# Patient Record
Sex: Male | Born: 1940
Health system: Southern US, Community
[De-identification: ages and names within clinical notes are randomized; demographics above are authoritative.]

## PROBLEM LIST (undated history)

## (undated) DIAGNOSIS — R600 Localized edema: Secondary | ICD-10-CM

## (undated) DIAGNOSIS — N183 Chronic kidney disease, stage 3 unspecified: Secondary | ICD-10-CM

## (undated) DIAGNOSIS — I10 Essential (primary) hypertension: Secondary | ICD-10-CM

## (undated) DIAGNOSIS — E785 Hyperlipidemia, unspecified: Secondary | ICD-10-CM

## (undated) DIAGNOSIS — I82409 Acute embolism and thrombosis of unspecified deep veins of unspecified lower extremity: Secondary | ICD-10-CM

## (undated) HISTORY — PX: COLONOSCOPY: SHX174

---

## 2002-03-20 ENCOUNTER — Ambulatory Visit (HOSPITAL_COMMUNITY): Admission: RE | Admit: 2002-03-20 | Discharge: 2002-03-20 | Payer: Self-pay | Admitting: Family Medicine

## 2002-03-20 ENCOUNTER — Encounter: Payer: Self-pay | Admitting: Family Medicine

## 2002-05-03 ENCOUNTER — Encounter: Admission: RE | Admit: 2002-05-03 | Discharge: 2002-08-01 | Payer: Self-pay | Admitting: Family Medicine

## 2006-08-04 ENCOUNTER — Ambulatory Visit (HOSPITAL_COMMUNITY): Admission: RE | Admit: 2006-08-04 | Discharge: 2006-08-04 | Payer: Self-pay | Admitting: Internal Medicine

## 2010-02-20 HISTORY — PX: COLONOSCOPY: SHX174

## 2010-02-25 ENCOUNTER — Encounter: Payer: Self-pay | Admitting: Internal Medicine

## 2010-03-17 ENCOUNTER — Ambulatory Visit: Payer: Self-pay | Admitting: Internal Medicine

## 2010-03-17 ENCOUNTER — Ambulatory Visit (HOSPITAL_COMMUNITY): Admission: RE | Admit: 2010-03-17 | Discharge: 2010-03-17 | Payer: Self-pay | Admitting: Internal Medicine

## 2010-03-18 ENCOUNTER — Encounter: Payer: Self-pay | Admitting: Internal Medicine

## 2010-08-13 ENCOUNTER — Emergency Department (HOSPITAL_COMMUNITY)
Admission: EM | Admit: 2010-08-13 | Discharge: 2010-08-13 | Payer: Self-pay | Source: Home / Self Care | Admitting: Emergency Medicine

## 2010-12-22 NOTE — Letter (Signed)
Summary: Internal Other Domingo Dimes  Internal Other Domingo Dimes   Imported By: Cloria Spring LPN 04/54/0981 19:14:78  _____________________________________________________________________  External Attachment:    Type:   Image     Comment:   External Document

## 2010-12-24 NOTE — Letter (Signed)
Summary: Patient Notice, Colon Biopsy Results  [facility]  [address]   [place], [place] [address]   Phone: [phone]  Fax: [phone]       March 18, 2010   TURRELL SEVERT [address] [place], [place]  [address] [birth year]    Dear Mr. Witty,  I am pleased to inform you that the biopsies taken during your recent colonoscopy did not show any evidence of cancer upon pathologic examination.  Additional information/recommendations:  You should have a repeat colonoscopy examination  in 5 years.  Please call us if you are having persistent problems or have questions about your condition that have not been fully answered at this time.  Sincerely,    R. Roetta Sessions MD, FACP [facility] Ph: [phone]    Fax: [phone]   Appended Document: Patient Notice, Colon Biopsy Results letter mailed to pt  Appended Document: Patient Notice, Colon Biopsy Results Reminder placed in idx.

## 2011-02-09 LAB — GLUCOSE, CAPILLARY: Glucose-Capillary: 153 mg/dL — ABNORMAL HIGH (ref 70–99)

## 2011-02-26 ENCOUNTER — Emergency Department (HOSPITAL_COMMUNITY): Payer: Medicare PPO

## 2011-02-26 ENCOUNTER — Emergency Department (HOSPITAL_COMMUNITY)
Admission: EM | Admit: 2011-02-26 | Discharge: 2011-02-26 | Disposition: A | Discharge status: Home / Self Care | Payer: Medicare PPO | Attending: Emergency Medicine | Admitting: Emergency Medicine

## 2011-02-26 ENCOUNTER — Encounter (HOSPITAL_COMMUNITY): Payer: Self-pay | Admitting: Radiology

## 2011-02-26 DIAGNOSIS — I10 Essential (primary) hypertension: Secondary | ICD-10-CM | POA: Insufficient documentation

## 2011-02-26 DIAGNOSIS — X58XXXA Exposure to other specified factors, initial encounter: Secondary | ICD-10-CM | POA: Insufficient documentation

## 2011-02-26 DIAGNOSIS — E119 Type 2 diabetes mellitus without complications: Secondary | ICD-10-CM | POA: Insufficient documentation

## 2011-02-26 DIAGNOSIS — R000 Tachycardia, unspecified: Secondary | ICD-10-CM | POA: Insufficient documentation

## 2011-02-26 DIAGNOSIS — Y998 Other external cause status: Secondary | ICD-10-CM | POA: Insufficient documentation

## 2011-02-26 DIAGNOSIS — S61209A Unspecified open wound of unspecified finger without damage to nail, initial encounter: Secondary | ICD-10-CM | POA: Insufficient documentation

## 2011-02-26 DIAGNOSIS — Z1889 Other specified retained foreign body fragments: Secondary | ICD-10-CM | POA: Insufficient documentation

## 2011-02-26 HISTORY — DX: Essential (primary) hypertension: I10

## 2011-03-01 NOTE — Consult Note (Signed)
  NAMEJONG, RICKMAN NO.:  [id number]  MEDICAL RECORD NO.:  [id number]           PATIENT TYPE:  E  LOCATION:  APED                          FACILITY:  [facility]  PHYSICIAN:  J. Darreld Mclean, M.D. DATE OF BIRTH:  [birth year]  DATE OF CONSULTATION:  02/26/2011 DATE OF DISCHARGE:  02/26/2011                                CONSULTATION   Hugh Kamara was seen in the emergency room.  The patient is a 70 year old male who was working with a tire and a steel belt, couple pieces of metal got up in his left thumb.  About 3 days ago, he got 2 out, 1 small piece remains.  The PA anesthetized the finger and tried to get it out, he could not and he called me.  I triangulated with pieces of different sizes of needle.  I did not know where the small piece of metal was, but we cannot get good tourniquet control in the ER here.  They had no sterile rubber bands used at the base of his thumb to access with temporary finger tourniquet, so I decided to stop.  I did not want to try to it get out but that was not what I needed.  I did anesthetized and place two 3-0 nylon sutures in place loosely.  The deep bulky sterile dressing was applied.  I will see him in the office Monday morning and I will scheduled next week for removal of the foreign body from his left thumb in the operating room. The patient is agreeable to this.  Prescription given for Keflex and for pain Vicodin.  If any difficulty, he is to call and let me know and I will see him in the office Monday morning.          ______________________________ J. Darreld Mclean, M.D.     JWK/MEDQ  D:  02/26/2011  T:  02/27/2011  Job:  [id number]  Electronically Signed by Darreld Mclean M.D. on 03/01/2011 12:46:02 PM

## 2012-01-04 ENCOUNTER — Other Ambulatory Visit: Payer: Self-pay

## 2012-01-04 ENCOUNTER — Inpatient Hospital Stay (HOSPITAL_COMMUNITY)
Admission: EM | Admit: 2012-01-04 | Discharge: 2012-01-07 | DRG: 690 | Disposition: A | Discharge status: Home / Self Care | Payer: Medicare PPO | Attending: Internal Medicine | Admitting: Internal Medicine

## 2012-01-04 ENCOUNTER — Emergency Department (HOSPITAL_COMMUNITY): Payer: Medicare PPO

## 2012-01-04 ENCOUNTER — Encounter (HOSPITAL_COMMUNITY): Payer: Self-pay | Admitting: *Deleted

## 2012-01-04 DIAGNOSIS — R739 Hyperglycemia, unspecified: Secondary | ICD-10-CM

## 2012-01-04 DIAGNOSIS — N390 Urinary tract infection, site not specified: Principal | ICD-10-CM | POA: Diagnosis present

## 2012-01-04 DIAGNOSIS — Z6831 Body mass index (BMI) 31.0-31.9, adult: Secondary | ICD-10-CM

## 2012-01-04 DIAGNOSIS — I10 Essential (primary) hypertension: Secondary | ICD-10-CM | POA: Diagnosis present

## 2012-01-04 DIAGNOSIS — Z7982 Long term (current) use of aspirin: Secondary | ICD-10-CM

## 2012-01-04 DIAGNOSIS — Z79899 Other long term (current) drug therapy: Secondary | ICD-10-CM

## 2012-01-04 DIAGNOSIS — R000 Tachycardia, unspecified: Secondary | ICD-10-CM

## 2012-01-04 DIAGNOSIS — E119 Type 2 diabetes mellitus without complications: Secondary | ICD-10-CM | POA: Diagnosis present

## 2012-01-04 LAB — URINALYSIS, ROUTINE W REFLEX MICROSCOPIC
Nitrite: NEGATIVE
Specific Gravity, Urine: >1.03 — ABNORMAL HIGH (ref 1.005–1.030)
Urobilinogen, UA: 0.2 mg/dL (ref 0.0–1.0)

## 2012-01-04 LAB — COMPREHENSIVE METABOLIC PANEL
ALT: 40 U/L (ref 0–53)
Alkaline Phosphatase: 105 U/L (ref 39–117)
CO2: 29 mEq/L (ref 19–32)
GFR calc Af Amer: >90 mL/min (ref >90)
GFR calc non Af Amer: 80 mL/min — ABNORMAL LOW (ref >90)
Glucose, Bld: 223 mg/dL — ABNORMAL HIGH (ref 70–99)
Potassium: 4.1 mEq/L (ref 3.5–5.1)
Sodium: 138 mEq/L (ref 135–145)

## 2012-01-04 LAB — DIFFERENTIAL
Lymphocytes Relative: 6 % — ABNORMAL LOW (ref 12–46)
Lymphs Abs: 0.6 10*3/uL — ABNORMAL LOW (ref 0.7–4.0)
Neutrophils Relative %: 91 % — ABNORMAL HIGH (ref 43–77)

## 2012-01-04 LAB — CBC
Platelets: 282 10*3/uL (ref 150–400)
RBC: 4.75 MIL/uL (ref 4.22–5.81)
WBC: 11 10*3/uL — ABNORMAL HIGH (ref 4.0–10.5)

## 2012-01-04 LAB — URINE MICROSCOPIC-ADD ON

## 2012-01-04 LAB — GLUCOSE, CAPILLARY: Glucose-Capillary: 192 mg/dL — ABNORMAL HIGH (ref 70–99)

## 2012-01-04 MED ORDER — ASPIRIN 81 MG PO CHEW
81.0000 mg | CHEWABLE_TABLET | Freq: Every day | ORAL | Status: DC
  Administered 2012-01-05 – 2012-01-07 (×3): 81 mg via ORAL
  Filled 2012-01-04 (×3): qty 1

## 2012-01-04 MED ORDER — MORPHINE SULFATE 2 MG/ML IJ SOLN
1.0000 mg | INTRAMUSCULAR | Status: DC | PRN
  Administered 2012-01-05 – 2012-01-07 (×6): 1 mg via INTRAVENOUS
  Filled 2012-01-04 (×6): qty 1

## 2012-01-04 MED ORDER — ONDANSETRON HCL 4 MG/2ML IJ SOLN: 4.0000 mg | INTRAMUSCULAR | Status: DC | PRN

## 2012-01-04 MED ORDER — ONDANSETRON HCL 4 MG/2ML IJ SOLN
INTRAMUSCULAR | Status: AC
  Administered 2012-01-04: 4 mg
  Filled 2012-01-04: qty 2

## 2012-01-04 MED ORDER — SIMVASTATIN 20 MG PO TABS
20.0000 mg | ORAL_TABLET | Freq: Every day | ORAL | Status: DC
Start: 2012-01-05 — End: 2012-01-05

## 2012-01-04 MED ORDER — ONDANSETRON HCL 4 MG/2ML IJ SOLN
4.0000 mg | Freq: Once | INTRAMUSCULAR | Status: AC
  Administered 2012-01-04: 4 mg via INTRAVENOUS
  Filled 2012-01-04: qty 2

## 2012-01-04 MED ORDER — LISINOPRIL 10 MG PO TABS
20.0000 mg | ORAL_TABLET | Freq: Every day | ORAL | Status: DC
  Administered 2012-01-05 – 2012-01-07 (×3): 20 mg via ORAL
  Filled 2012-01-04 (×3): qty 2

## 2012-01-04 MED ORDER — ENOXAPARIN SODIUM 40 MG/0.4ML ~~LOC~~ SOLN
40.0000 mg | SUBCUTANEOUS | Status: DC
  Administered 2012-01-05 – 2012-01-07 (×3): 40 mg via SUBCUTANEOUS
  Filled 2012-01-04 (×3): qty 0.4

## 2012-01-04 MED ORDER — HYDRALAZINE HCL 25 MG PO TABS
25.0000 mg | ORAL_TABLET | Freq: Two times a day (BID) | ORAL | Status: DC
  Administered 2012-01-05 – 2012-01-07 (×5): 25 mg via ORAL
  Filled 2012-01-04 (×5): qty 1

## 2012-01-04 MED ORDER — METFORMIN HCL 500 MG PO TABS
1000.0000 mg | ORAL_TABLET | Freq: Two times a day (BID) | ORAL | Status: DC
  Administered 2012-01-05 – 2012-01-07 (×4): 1000 mg via ORAL
  Filled 2012-01-04 (×4): qty 2

## 2012-01-04 MED ORDER — GLIPIZIDE 5 MG PO TABS
10.0000 mg | ORAL_TABLET | Freq: Two times a day (BID) | ORAL | Status: DC
  Administered 2012-01-05 – 2012-01-07 (×4): 10 mg via ORAL
  Filled 2012-01-04 (×4): qty 2

## 2012-01-04 MED ORDER — SIMVASTATIN 20 MG PO TABS: 20.0000 mg | ORAL_TABLET | Freq: Every day | ORAL | Status: DC

## 2012-01-04 MED ORDER — DEXTROSE 5 % IV SOLN
1.0000 g | INTRAVENOUS | Status: DC
  Administered 2012-01-05 – 2012-01-06 (×2): 1 g via INTRAVENOUS
  Filled 2012-01-04 (×5): qty 10

## 2012-01-04 MED ORDER — DEXTROSE 5 % IV SOLN
1.0000 g | Freq: Once | INTRAVENOUS | Status: AC
  Administered 2012-01-04: 1 g via INTRAVENOUS
  Filled 2012-01-04: qty 10

## 2012-01-04 MED ORDER — MORPHINE SULFATE 4 MG/ML IJ SOLN
4.0000 mg | Freq: Once | INTRAMUSCULAR | Status: AC
  Administered 2012-01-04: 4 mg via INTRAVENOUS
  Filled 2012-01-04: qty 1

## 2012-01-04 MED ORDER — INSULIN ASPART 100 UNIT/ML ~~LOC~~ SOLN
0.0000 [IU] | Freq: Three times a day (TID) | SUBCUTANEOUS | Status: DC
  Administered 2012-01-05 (×2): 2 [IU] via SUBCUTANEOUS
  Administered 2012-01-06: 5 [IU] via SUBCUTANEOUS
  Filled 2012-01-04: qty 3

## 2012-01-04 MED ORDER — CLONIDINE HCL 0.2 MG PO TABS
ORAL_TABLET | ORAL | Status: AC
  Administered 2012-01-04: 0.2 mg
  Filled 2012-01-04: qty 1

## 2012-01-04 MED ORDER — INSULIN ASPART 100 UNIT/ML ~~LOC~~ SOLN: 0.0000 [IU] | Freq: Every day | SUBCUTANEOUS | Status: DC

## 2012-01-04 MED ORDER — SODIUM CHLORIDE 0.9 % IV SOLN
INTRAVENOUS | Status: DC
  Administered 2012-01-05 – 2012-01-06 (×3): via INTRAVENOUS

## 2012-01-04 MED ORDER — VERAPAMIL HCL 80 MG PO TABS
ORAL_TABLET | ORAL | Status: AC
  Administered 2012-01-04: 120 mg
  Filled 2012-01-04: qty 2

## 2012-01-04 MED ORDER — CLONIDINE HCL 0.2 MG PO TABS
0.2000 mg | ORAL_TABLET | Freq: Every day | ORAL | Status: DC
  Administered 2012-01-05 – 2012-01-07 (×3): 0.2 mg via ORAL
  Filled 2012-01-04 (×3): qty 1

## 2012-01-04 MED ORDER — MORPHINE SULFATE 2 MG/ML IJ SOLN
INTRAMUSCULAR | Status: AC
  Administered 2012-01-04: 1 mg via INTRAVENOUS
  Filled 2012-01-04: qty 1

## 2012-01-04 MED ORDER — LINAGLIPTIN 5 MG PO TABS
5.0000 mg | ORAL_TABLET | Freq: Every day | ORAL | Status: DC
  Administered 2012-01-05 – 2012-01-07 (×3): 5 mg via ORAL
  Filled 2012-01-04 (×6): qty 1

## 2012-01-04 MED ORDER — SODIUM CHLORIDE 0.9 % IV BOLUS (SEPSIS)
1000.0000 mL | Freq: Once | INTRAVENOUS | Status: AC
  Administered 2012-01-04: 1000 mL via INTRAVENOUS

## 2012-01-04 NOTE — ED Notes (Signed)
Pt woke up this morning with mid back pain and vomiting.  Denies abd pain, denies hematuria.

## 2012-01-04 NOTE — ED Provider Notes (Signed)
History  Scribed for Joya Gaskins, MD, the patient was seen in room APA11/APA11. This chart was scribed by Candelaria Stagers. The patient's care started at 3:29 PM    CSN: [id number]  Arrival date & time 01/04/12  1404   First MD Initiated Contact with Patient 01/04/12 1505      Chief Complaint  Patient presents with  . Emesis  . Back Pain     Patient is a 71 y.o. male presenting with vomiting and back pain. The history is provided by the patient.  Emesis  This is a new problem. The current episode started 6 to 12 hours ago. The problem occurs 2 to 4 times per day. The problem has not changed since onset.There has been no fever. Pertinent negatives include no abdominal pain, no cough, no diarrhea and no fever.  Back Pain  This is a new problem. The current episode started 6 to 12 hours ago. The pain is present in the lumbar spine. The quality of the pain is described as aching. The pain does not radiate. The pain is moderate. The symptoms are aggravated by twisting and bending. Pertinent negatives include no chest pain, no fever, no abdominal pain and no weakness. He has tried nothing for the symptoms. The treatment provided no relief.   Stephen Rogers is a 71 y.o. male who presents to the Emergency Department complaining of vomiting and back pain in the lower back that began this morning.  Pt states that he has vomited four or five times and felt normal before going to bed last night.  He denies blood in emesis or stool, diarrhea, abdominal pain, chest pain, coughing, SOB, weakness in arms or legs, syncope, or fever.  Pt has h/o diabetes, HTN and has never had a CAD/CVA.  He states that he feels better at the moment and that the back pain is worsened with movement.  His PCP is Dr. Felecia Shelling.      Past Medical History  Diagnosis Date  . Hypertension   . Diabetes mellitus     History reviewed. No pertinent past surgical history.  No family history on file.  History  Substance  Use Topics  . Smoking status: Never Smoker   . Smokeless tobacco: Not on file  . Alcohol Use: No      Review of Systems  Constitutional: Negative for fever.  Respiratory: Negative for cough and shortness of breath.   Cardiovascular: Negative for chest pain.  Gastrointestinal: Positive for nausea and vomiting. Negative for abdominal pain, diarrhea and blood in stool.  Musculoskeletal: Positive for back pain.  Neurological: Negative for syncope and weakness.  All other systems reviewed and are negative.    Allergies  Review of patient's allergies indicates no known allergies.  Home Medications   Current Outpatient Rx  Name Route Sig Dispense Refill  . ASPIRIN EC 81 MG PO TBEC Oral Take 81 mg by mouth daily.    Marland Kitchen CLONIDINE HCL 0.2 MG PO TABS Oral Take 0.2 mg by mouth daily.    Marland Kitchen GLIPIZIDE 10 MG PO TABS Oral Take 10 mg by mouth 2 (two) times daily.    Marland Kitchen GLUCOSE BLOOD VI STRP Other 1 each by Other route 2 (two) times daily. Use as instructed    . HYDRALAZINE HCL 25 MG PO TABS Oral Take 25 mg by mouth 2 (two) times daily.    Marland Kitchen LISINOPRIL 20 MG PO TABS Oral Take 20 mg by mouth daily.    Marland Kitchen METFORMIN HCL  1000 MG PO TABS Oral Take 1,000 mg by mouth 2 (two) times daily.    Marland Kitchen SIMVASTATIN 20 MG PO TABS Oral Take 20 mg by mouth daily.    Marland Kitchen SITAGLIPTIN PHOSPHATE 50 MG PO TABS Oral Take 50 mg by mouth daily.    Marland Kitchen VERAPAMIL HCL 120 MG PO TABS Oral Take 120 mg by mouth daily.      BP 174/150  Pulse 107  Temp(Src) 98.2 F (36.8 C) (Oral)  Resp 18  Ht 5\' 6"  (1.676 m)  Wt 198 lb (89.812 kg)  BMI 31.96 kg/m2  SpO2 100%  Physical Exam CONSTITUTIONAL: Well developed/well nourished HEAD AND FACE: Normocephalic/atraumatic EYES: EOMI/PERRL ENMT: Mucous membranes moist NECK: supple no meningeal signs SPINE:entire spine nontender, No bruising/crepitance/stepoffs noted to spine CV: S1/S2 noted, no murmurs/rubs/gallops noted LUNGS: course breath sounds in the bases ABDOMEN: soft, nontender,  no rebound or guarding GU: left cva tenderness (also worse with movement) NEURO: Pt is awake/alert, moves all extremitiesx4, no focal motor deficits are noted EXTREMITIES: pulses normal, full ROM SKIN: warm, color normal PSYCH: no abnormalities of mood noted  ED Course  Procedures   DIAGNOSTIC STUDIES: Oxygen Saturation is 100% on room air, normal by my interpretation.    COORDINATION OF CARE:  3:37PM Ordered: CBC ; Differential ; Comprehensive metabolic panel ; Lipase, blood ; DG Chest Port 1 View ; POCT CBG monitoring     Labs Reviewed  URINALYSIS, ROUTINE W REFLEX MICROSCOPIC - Abnormal; Notable for the following:    Specific Gravity, Urine >1.030 (*)    Glucose, UA 250 (*)    Hgb urine dipstick SMALL (*)    Ketones, ur 15 (*)    Protein, ur 100 (*)    All other components within normal limits  CBC - Abnormal; Notable for the following:    WBC 11.0 (*)    All other components within normal limits  DIFFERENTIAL - Abnormal; Notable for the following:    Neutrophils Relative 91 (*)    Neutro Abs 10.0 (*)    Lymphocytes Relative 6 (*)    Lymphs Abs 0.6 (*)    All other components within normal limits  COMPREHENSIVE METABOLIC PANEL - Abnormal; Notable for the following:    Glucose, Bld 223 (*)    Total Protein 8.5 (*)    GFR calc non Af Amer 80 (*)    All other components within normal limits  GLUCOSE, CAPILLARY - Abnormal; Notable for the following:    Glucose-Capillary 201 (*)    All other components within normal limits  URINE MICROSCOPIC-ADD ON - Abnormal; Notable for the following:    Bacteria, UA FEW (*)    Casts GRANULAR CAST (*)    All other components within normal limits  LIPASE, BLOOD    7:02 PM Pt with some vomitng, but no cp/sob, and no upper back pain He still has some CVA tenderness. Xray noted ,but at this time, suspicion for acute aortic dissection is low  7:02 PM Pt still with left CVA tenderness, but feels improved Continues to deny  cp/sob/weakness/upper back pain  7:30 PM Pt still with vomiting/tachycardia  Still with CVA tenderness ?prostatitis given prostate prominent on ct, also has pyuria Again he denies CP/SOB/weakness - doubt dissection But given vomiting/tachycardia, elevated WBC count, will admit D/w dr Felecia Shelling who will admit, d/w him over phone, he called in order Pt stabilized in the ED    MDM  Nursing notes reviewed and considered in documentation All labs/vitals reviewed and  considered xrays reviewed and considered    Date: 01/04/2012  Rate: 109  Rhythm: sinus tachycardia  QRS Axis: normal  Intervals: normal  ST/T Wave abnormalities: nonspecific ST changes  Conduction Disutrbances:none  Narrative Interpretation:   Old EKG Reviewed: none available    I personally performed the services described in this documentation, which was scribed in my presence. The recorded information has been reviewed and considered.         Joya Gaskins, MD 01/04/12 [id number]

## 2012-01-04 NOTE — ED Notes (Addendum)
Paged and spoke with Dr. Felecia Shelling. Notified Dr. Felecia Shelling that patient had stated that he had not had his blood pressure medication today due to the vomiting he is experiencing. Also notified him of patient's elevated blood pressure and asked if he wanted me to give him anything IV. Stated to give him his normally blood pressure pills especially starting with clonidine. Also notified Dr. Felecia Shelling that patient's oxygen saturation has dropped a few times to 88%. Stated to place patient on 2L of oxygen via nasal canula.

## 2012-01-05 LAB — GLUCOSE, CAPILLARY
Glucose-Capillary: 147 mg/dL — ABNORMAL HIGH (ref 70–99)
Glucose-Capillary: 141 mg/dL — ABNORMAL HIGH (ref 70–99)
Glucose-Capillary: 149 mg/dL — ABNORMAL HIGH (ref 70–99)

## 2012-01-05 MED ORDER — HYDRALAZINE HCL 25 MG PO TABS
25.0000 mg | ORAL_TABLET | Freq: Once | ORAL | Status: AC
  Administered 2012-01-05: 25 mg via ORAL
  Filled 2012-01-05: qty 1

## 2012-01-05 MED ORDER — VERAPAMIL HCL ER 240 MG PO TBCR
120.0000 mg | EXTENDED_RELEASE_TABLET | Freq: Every day | ORAL | Status: DC
  Administered 2012-01-05 – 2012-01-07 (×3): 120 mg via ORAL
  Filled 2012-01-05 (×3): qty 1

## 2012-01-05 MED ORDER — BIOTENE DRY MOUTH MT LIQD
15.0000 mL | Freq: Two times a day (BID) | OROMUCOSAL | Status: DC
  Administered 2012-01-05 – 2012-01-07 (×5): 15 mL via OROMUCOSAL

## 2012-01-05 MED ORDER — SODIUM CHLORIDE 0.9 % IJ SOLN
INTRAMUSCULAR | Status: AC
  Administered 2012-01-05: 3 mL
  Filled 2012-01-05: qty 3

## 2012-01-05 MED ORDER — ROSUVASTATIN CALCIUM 5 MG PO TABS
5.0000 mg | ORAL_TABLET | Freq: Every day | ORAL | Status: DC
  Administered 2012-01-05 – 2012-01-06 (×2): 5 mg via ORAL
  Filled 2012-01-05 (×2): qty 1

## 2012-01-05 NOTE — H&P (Signed)
NAMELEDON, WEIHE NO.:  [id number]  MEDICAL RECORD NO.:  [id number]  LOCATION:  A309                          FACILITY:  [facility]  PHYSICIAN:  Nataliah Hatlestad D. Felecia Shelling, MD   DATE OF BIRTH:  [birth year]  DATE OF ADMISSION:  01/04/2012 DATE OF DISCHARGE:  LH                             HISTORY & PHYSICAL   CHIEF COMPLAINT:  Nausea, vomiting, abdominal pain, and back pain.  HISTORY OF PRESENT ILLNESS:  This is a 71 year old male patient with history of multiple medical illnesses, who came to emergency room with above complaints.  The patient was in his usual state of health until yesterday when he developed recurrent nausea, vomiting.  He had low back pain, which was radiating towards his groin area.  This was followed with nausea and vomiting.  The patient was evaluated in the emergency room.  Several tests including CT scan of the abdomen was done.  His urinalysis showed abnormal finding with increased wbc and many bacteria. The patient was admitted as a possible case of urinary tract infection. He was started on IV antibiotics and IV fluid for hydration.  The patient is currently feeling slightly better.  REVIEW OF SYSTEMS:  No headache, fever, chest pain, cough, shortness of breath, or abdominal pain.  No leg edema.  The patient had difficulty in urination and some discomfort on urination.  PAST MEDICAL HISTORY: 1. Diabetes mellitus. 2. Hypertension.  SOCIAL HISTORY:  The patient is married.  No history of alcohol, tobacco, or substance abuse.  FAMILY HISTORY:  Both his mother and his father has decease.  No known family illness.  PHYSICAL EXAMINATION:  GENERAL:  The patient is alert, awake, and sick looking. VITAL SIGNS:  Blood pressure 123/73, pulse 84, respiratory rate 16, temperature 98.0 degree Fahrenheit. HEENT:  Pupils are equal, reactive. NECK:  Supple. CHEST:  Clear lung fields.  Good air entry. CARDIOVASCULAR SYSTEM:  First and second heart sounds  heard.  No murmur. No gallop. ABDOMEN:  Soft and lax.  Bowel sound is positive.  No mass or organomegaly. EXTREMITIES:  No leg edema.  LABORATORIES:  On admission, lipase 14.  Sodium 138, potassium 4.1, chloride 97, carbon dioxide 29, glucose 223, BUN 16, creatinine 0.8, calcium 10.0.  CBC:  WBC 11.0, hemoglobin 14.7, hematocrit 42.7, and platelets 282.  Urinalysis, wbc's 11-20, bacteria many.  He has also granular casts.  The pH is 5.5, specific gravity greater than 1.030.  ASSESSMENT: 1. Urinary tract infection. 2. Nausea, vomiting probably secondary to the above. 3. Diabetes mellitus. 4. Hypertension.  PLAN:  We will continue the patient on IV Rocephin.  We will gradually rehydrate.  We will continue his regular medications.  Continue supportive care.     Thomasina Housley D. Felecia Shelling, MD     TDF/MEDQ  D:  01/05/2012  T:  01/05/2012  Job:  [id number]

## 2012-01-05 NOTE — Progress Notes (Signed)
Stephen Rogers, Stephen Rogers NO.:  [id number]  MEDICAL RECORD NO.:  [id number]  LOCATION:  A309                          FACILITY:  [facility]  PHYSICIAN:  Christasia Angeletti D. Felecia Shelling, MD   DATE OF BIRTH:  06/18/1941  DATE OF PROCEDURE:  01/05/2012 DATE OF DISCHARGE:                                PROGRESS NOTE   SUBJECTIVE:  The patient feels better today.  He did not have nausea or vomiting this morning.  His back pain is also improving.  No new complaints.  OBJECTIVE:  GENERAL:  The patient is alert, awake, and sick looking. VITAL SIGNS:  Blood pressure 121/73, pulse 84, respiratory rate 16, temperature 98 degrees Fahrenheit. CHEST:  Clear lung fields.  Good air entry. CARDIOVASCULAR SYSTEM:  First and second heart sounds heard.  No murmur. No gallop. ABDOMEN:  Soft and lax.  Bowel sound is positive.  No mass or organomegaly. EXTREMITIES:  No leg edema.  ASSESSMENT: 1. Urinary tract infection. 2. Nausea, vomiting, secondary to the above. 3. Diabetes mellitus. 4. Hypertension.  PLAN:  We will continue the patient on IV Rocephin.  We will continue to monitor his blood sugar.  We will continue IV hydration.  Continue his regular medications.     [name] D. Felecia Shelling, MD     TDF/MEDQ  D:  01/05/2012  T:  01/05/2012  Job:  [id number]

## 2012-01-05 NOTE — Progress Notes (Addendum)
CBG 192 at 2224 when pt arrived to the floor., no coverage needed at this time according to bedtime parameters. Also, noticed on assessment that pt has a swollen left ankle. Pts says it doesn't hurt but he twisted it 1-2 weeks ago. Stephen Rogers

## 2012-01-05 NOTE — Progress Notes (Addendum)
Pharmacy notified that patient's verapamil was not ordered into epic, the pharmacist feels that the verapamil that is listed on the order sheet as a prior to admission medication needs to be sustained release if it is only given once daily, and she would like for the pharmacist here at [facility] to further review this in the am. I will notify the next shift RN of this. Sheryn Bison

## 2012-01-06 LAB — GLUCOSE, CAPILLARY
Glucose-Capillary: 137 mg/dL — ABNORMAL HIGH (ref 70–99)
Glucose-Capillary: 132 mg/dL — ABNORMAL HIGH (ref 70–99)
Glucose-Capillary: 168 mg/dL — ABNORMAL HIGH (ref 70–99)

## 2012-01-06 LAB — URINE CULTURE
Colony Count: NO GROWTH
Culture  Setup Time: 201302131035

## 2012-01-06 MED ORDER — BISACODYL 10 MG RE SUPP
10.0000 mg | Freq: Every day | RECTAL | Status: DC | PRN
  Administered 2012-01-06: 10 mg via RECTAL
  Filled 2012-01-06: qty 1

## 2012-01-06 NOTE — Progress Notes (Signed)
NAMEEARVIN, BLAZIER NO.:  [id number]  MEDICAL RECORD NO.:  [id number]  LOCATION:  A309                          FACILITY:  [facility]  PHYSICIAN:  Felicite Zeimet D. Felecia Shelling, MD   DATE OF BIRTH:  08-08-1941  DATE OF PROCEDURE:  01/06/2012 DATE OF DISCHARGE:                                PROGRESS NOTE   SUBJECTIVE:  The patient continued to complain of nausea and abdominal pain.  No fever or chills.  He is receiving IV antibiotics.  PHYSICAL EXAMINATION:  VITAL SIGNS:  Blood pressure 130/76, pulse 92, respiratory rate 18, temperature 98 degrees Fahrenheit. CHEST:  Decreased air entry, few rhonchi. CARDIOVASCULAR:  First and second heart sounds heard.  No murmur.  No gallop. ABDOMEN:  Soft and lax.  Bowel sounds are positive.  No mass or organomegaly. EXTREMITIES:  No leg edema.  LABORATORY DATA:  Blood sugar is running between 100 to 150.  ASSESSMENT: 1. Abdominal pain, nausea, vomiting, probably secondary to urinary     tract infection. 2. Diabetes mellitus. 3. Hypertension.  PLAN:  We will continue the patient on IV antibiotics.  Continue pain medications.  Continue current treatment and supportive care.     Lilyanah Celestin D. Felecia Shelling, MD     TDF/MEDQ  D:  01/06/2012  T:  01/06/2012  Job:  [id number]

## 2012-01-07 LAB — BASIC METABOLIC PANEL
Calcium: 9 mg/dL (ref 8.4–10.5)
Creatinine, Ser: 1.03 mg/dL (ref 0.50–1.35)
GFR calc Af Amer: 82 mL/min — ABNORMAL LOW (ref >90)
GFR calc non Af Amer: 71 mL/min — ABNORMAL LOW (ref >90)
Sodium: 136 mEq/L (ref 135–145)

## 2012-01-07 LAB — CBC
MCH: 29.8 pg (ref 26.0–34.0)
MCV: 89.2 fL (ref 78.0–100.0)
Platelets: 282 10*3/uL (ref 150–400)
RBC: 4.26 MIL/uL (ref 4.22–5.81)
RDW: 12.3 % (ref 11.5–15.5)

## 2012-01-07 MED ORDER — HYDROCODONE-ACETAMINOPHEN 5-500 MG PO TABS: 1.0000 | ORAL_TABLET | Freq: Four times a day (QID) | ORAL | Status: AC | PRN

## 2012-01-07 MED ORDER — CIPROFLOXACIN HCL 500 MG PO TABS: 500.0000 mg | ORAL_TABLET | Freq: Two times a day (BID) | ORAL | Status: AC

## 2012-01-07 NOTE — Discharge Instructions (Signed)

## 2012-01-07 NOTE — Progress Notes (Signed)
01/07/12 1144 patient discharged this morning, left floor in stable condition via w/c accompanied by nurse.

## 2012-01-07 NOTE — Progress Notes (Signed)
01/07/12 0939 patient being discharged home. IV site d/c'd within normal limits, denies pain or discomfort at this time. Reviewed discharge instructions with patient via teachback method, verbalized understanding. Given copy of instructions, med list, carenote, f/u appointment set up. Prescription for pain medication given to patient per dr Felecia Shelling, sister, Dois Davenport has taken to have med filled per patient, prescription for cipro sent to [facility] per dr Felecia Shelling, pt states takes about 1 week to get medication back. Notified Dr Felecia Shelling and requested med called in to [facility] at patient request, stated would call in. Pt in stable condition awaiting family arrival for transport home.

## 2012-01-07 NOTE — Discharge Summary (Signed)
NAMESTEWART, Stephen Rogers NO.:  [id number]  MEDICAL RECORD NO.:  [id number]  LOCATION:  A309                          FACILITY:  [facility]  PHYSICIAN:  Edy Belt D. Felecia Shelling, MD   DATE OF BIRTH:  07/08/1941  DATE OF ADMISSION:  01/04/2012 DATE OF DISCHARGE:  02/15/2013LH                              DISCHARGE SUMMARY   DISCHARGE DIAGNOSES: 1. Abdominal pain, nausea, vomiting, probably secondary to urinary     tract infection. 2. Back pain. 3. Diabetes mellitus. 4. Hypertension.  DISCHARGE MEDICATIONS: 1. Cipro 500 mg p.o. b.i.d. for 5 days. 2. Vicodin 5/500 one tablet p.o. q.6 hours p.r.n. 3. Aspirin 81 mg daily. 4. Clonidine 0.2 mg daily. 5. Glucotrol 10 mg b.i.d. 6. Hydralazine 250 mg b.i.d. 7. Lisinopril 20 mg daily. 8. Metformin 1000 mg b.i.d. 9. Simvastatin 20 mg daily. 10.Januvia 50 mg daily. 11.Verapamil 120 mg p.o. daily.  DISPOSITION:  The patient will be discharged home in stable condition.  DISCHARGE INSTRUCTIONS:  The patient will be followed in my office in 2 weeks' time.  He will continue his regular medications including oral antibiotics.  LABORATORIES:  On discharge, CBC:  WBC 6.9, hemoglobin 12.7, hematocrit 38.0, and platelets 282.  BMP:  Sodium 136, potassium 3.9, chloride 100, carbon dioxide 27, glucose 128, BUN 16, creatinine 1.03.  HOSPITAL COURSE:  This is a 71 year old male patient with history of hypertension and diabetes mellitus who came to emergency room due to nausea, vomiting and abdominal pain.  His CT scan of the abdomen was negative. The patient, however, was found to have abnormal urinalysis.  His urine culture so far is negative.  However, he was treated with IV Rocephin and pain medications.  The patient currently has improved.  He will be discharged to home on oral antibiotics and will be followed in the office in 2 weeks' duration.     Deniss Wormley D. Felecia Shelling, MD     TDF/MEDQ  D:  01/07/2012  T:  01/07/2012  Job:   [id number]

## 2014-12-18 DIAGNOSIS — E1165 Type 2 diabetes mellitus with hyperglycemia: Secondary | ICD-10-CM | POA: Diagnosis not present

## 2014-12-18 DIAGNOSIS — I10 Essential (primary) hypertension: Secondary | ICD-10-CM | POA: Diagnosis not present

## 2015-01-01 DIAGNOSIS — E1165 Type 2 diabetes mellitus with hyperglycemia: Secondary | ICD-10-CM | POA: Diagnosis not present

## 2015-01-01 DIAGNOSIS — I10 Essential (primary) hypertension: Secondary | ICD-10-CM | POA: Diagnosis not present

## 2015-01-23 DIAGNOSIS — H5210 Myopia, unspecified eye: Secondary | ICD-10-CM | POA: Diagnosis not present

## 2015-01-23 DIAGNOSIS — H524 Presbyopia: Secondary | ICD-10-CM | POA: Diagnosis not present

## 2015-02-03 DIAGNOSIS — E1165 Type 2 diabetes mellitus with hyperglycemia: Secondary | ICD-10-CM | POA: Diagnosis not present

## 2015-02-03 DIAGNOSIS — I10 Essential (primary) hypertension: Secondary | ICD-10-CM | POA: Diagnosis not present

## 2015-02-12 ENCOUNTER — Encounter: Payer: Self-pay | Admitting: Internal Medicine

## 2015-03-03 DIAGNOSIS — H2511 Age-related nuclear cataract, right eye: Secondary | ICD-10-CM | POA: Diagnosis not present

## 2015-03-03 DIAGNOSIS — H4011X1 Primary open-angle glaucoma, mild stage: Secondary | ICD-10-CM | POA: Diagnosis not present

## 2015-03-17 DIAGNOSIS — H2511 Age-related nuclear cataract, right eye: Secondary | ICD-10-CM | POA: Diagnosis not present

## 2015-03-17 DIAGNOSIS — H25811 Combined forms of age-related cataract, right eye: Secondary | ICD-10-CM | POA: Diagnosis not present

## 2015-03-17 DIAGNOSIS — H4011X1 Primary open-angle glaucoma, mild stage: Secondary | ICD-10-CM | POA: Diagnosis not present

## 2015-03-19 DIAGNOSIS — I10 Essential (primary) hypertension: Secondary | ICD-10-CM | POA: Diagnosis not present

## 2015-03-19 DIAGNOSIS — E1165 Type 2 diabetes mellitus with hyperglycemia: Secondary | ICD-10-CM | POA: Diagnosis not present

## 2015-03-31 DIAGNOSIS — H25812 Combined forms of age-related cataract, left eye: Secondary | ICD-10-CM | POA: Diagnosis not present

## 2015-03-31 DIAGNOSIS — H4011X1 Primary open-angle glaucoma, mild stage: Secondary | ICD-10-CM | POA: Diagnosis not present

## 2015-03-31 DIAGNOSIS — H2512 Age-related nuclear cataract, left eye: Secondary | ICD-10-CM | POA: Diagnosis not present

## 2015-03-31 DIAGNOSIS — H409 Unspecified glaucoma: Secondary | ICD-10-CM | POA: Diagnosis not present

## 2015-04-10 DIAGNOSIS — H2512 Age-related nuclear cataract, left eye: Secondary | ICD-10-CM | POA: Diagnosis not present

## 2015-04-14 DIAGNOSIS — E1165 Type 2 diabetes mellitus with hyperglycemia: Secondary | ICD-10-CM | POA: Diagnosis not present

## 2015-04-14 DIAGNOSIS — I10 Essential (primary) hypertension: Secondary | ICD-10-CM | POA: Diagnosis not present

## 2015-04-14 DIAGNOSIS — E119 Type 2 diabetes mellitus without complications: Secondary | ICD-10-CM | POA: Diagnosis not present

## 2015-04-14 DIAGNOSIS — E663 Overweight: Secondary | ICD-10-CM | POA: Diagnosis not present

## 2015-05-15 DIAGNOSIS — E1165 Type 2 diabetes mellitus with hyperglycemia: Secondary | ICD-10-CM | POA: Diagnosis not present

## 2015-05-15 DIAGNOSIS — I10 Essential (primary) hypertension: Secondary | ICD-10-CM | POA: Diagnosis not present

## 2015-05-28 DIAGNOSIS — H4011X1 Primary open-angle glaucoma, mild stage: Secondary | ICD-10-CM | POA: Diagnosis not present

## 2015-06-14 DIAGNOSIS — E1165 Type 2 diabetes mellitus with hyperglycemia: Secondary | ICD-10-CM | POA: Diagnosis not present

## 2015-06-14 DIAGNOSIS — I10 Essential (primary) hypertension: Secondary | ICD-10-CM | POA: Diagnosis not present

## 2015-07-15 DIAGNOSIS — I10 Essential (primary) hypertension: Secondary | ICD-10-CM | POA: Diagnosis not present

## 2015-07-15 DIAGNOSIS — E1165 Type 2 diabetes mellitus with hyperglycemia: Secondary | ICD-10-CM | POA: Diagnosis not present

## 2015-08-15 DIAGNOSIS — I10 Essential (primary) hypertension: Secondary | ICD-10-CM | POA: Diagnosis not present

## 2015-08-15 DIAGNOSIS — E1165 Type 2 diabetes mellitus with hyperglycemia: Secondary | ICD-10-CM | POA: Diagnosis not present

## 2015-09-14 DIAGNOSIS — I10 Essential (primary) hypertension: Secondary | ICD-10-CM | POA: Diagnosis not present

## 2015-09-14 DIAGNOSIS — E1165 Type 2 diabetes mellitus with hyperglycemia: Secondary | ICD-10-CM | POA: Diagnosis not present

## 2015-10-01 DIAGNOSIS — Z23 Encounter for immunization: Secondary | ICD-10-CM | POA: Diagnosis not present

## 2015-10-01 DIAGNOSIS — E669 Obesity, unspecified: Secondary | ICD-10-CM | POA: Diagnosis not present

## 2015-10-01 DIAGNOSIS — I10 Essential (primary) hypertension: Secondary | ICD-10-CM | POA: Diagnosis not present

## 2015-10-01 DIAGNOSIS — E119 Type 2 diabetes mellitus without complications: Secondary | ICD-10-CM | POA: Diagnosis not present

## 2015-10-01 DIAGNOSIS — E1165 Type 2 diabetes mellitus with hyperglycemia: Secondary | ICD-10-CM | POA: Diagnosis not present

## 2015-10-08 DIAGNOSIS — I10 Essential (primary) hypertension: Secondary | ICD-10-CM | POA: Diagnosis not present

## 2015-10-31 DIAGNOSIS — I10 Essential (primary) hypertension: Secondary | ICD-10-CM | POA: Diagnosis not present

## 2015-10-31 DIAGNOSIS — E1165 Type 2 diabetes mellitus with hyperglycemia: Secondary | ICD-10-CM | POA: Diagnosis not present

## 2015-12-01 DIAGNOSIS — E1165 Type 2 diabetes mellitus with hyperglycemia: Secondary | ICD-10-CM | POA: Diagnosis not present

## 2015-12-01 DIAGNOSIS — I10 Essential (primary) hypertension: Secondary | ICD-10-CM | POA: Diagnosis not present

## 2016-01-01 DIAGNOSIS — I10 Essential (primary) hypertension: Secondary | ICD-10-CM | POA: Diagnosis not present

## 2016-01-01 DIAGNOSIS — E1165 Type 2 diabetes mellitus with hyperglycemia: Secondary | ICD-10-CM | POA: Diagnosis not present

## 2016-01-29 DIAGNOSIS — E1165 Type 2 diabetes mellitus with hyperglycemia: Secondary | ICD-10-CM | POA: Diagnosis not present

## 2016-01-29 DIAGNOSIS — I10 Essential (primary) hypertension: Secondary | ICD-10-CM | POA: Diagnosis not present

## 2016-03-15 DIAGNOSIS — E1165 Type 2 diabetes mellitus with hyperglycemia: Secondary | ICD-10-CM | POA: Diagnosis not present

## 2016-03-15 DIAGNOSIS — I10 Essential (primary) hypertension: Secondary | ICD-10-CM | POA: Diagnosis not present

## 2016-03-30 DIAGNOSIS — E1165 Type 2 diabetes mellitus with hyperglycemia: Secondary | ICD-10-CM | POA: Diagnosis not present

## 2016-03-30 DIAGNOSIS — Z6831 Body mass index (BMI) 31.0-31.9, adult: Secondary | ICD-10-CM | POA: Diagnosis not present

## 2016-03-30 DIAGNOSIS — I10 Essential (primary) hypertension: Secondary | ICD-10-CM | POA: Diagnosis not present

## 2016-04-30 DIAGNOSIS — I10 Essential (primary) hypertension: Secondary | ICD-10-CM | POA: Diagnosis not present

## 2016-04-30 DIAGNOSIS — E1165 Type 2 diabetes mellitus with hyperglycemia: Secondary | ICD-10-CM | POA: Diagnosis not present

## 2016-05-30 DIAGNOSIS — E1165 Type 2 diabetes mellitus with hyperglycemia: Secondary | ICD-10-CM | POA: Diagnosis not present

## 2016-05-30 DIAGNOSIS — I10 Essential (primary) hypertension: Secondary | ICD-10-CM | POA: Diagnosis not present

## 2016-07-31 DIAGNOSIS — E119 Type 2 diabetes mellitus without complications: Secondary | ICD-10-CM | POA: Diagnosis not present

## 2016-07-31 DIAGNOSIS — I10 Essential (primary) hypertension: Secondary | ICD-10-CM | POA: Diagnosis not present

## 2016-08-30 DIAGNOSIS — I10 Essential (primary) hypertension: Secondary | ICD-10-CM | POA: Diagnosis not present

## 2016-08-30 DIAGNOSIS — E1165 Type 2 diabetes mellitus with hyperglycemia: Secondary | ICD-10-CM | POA: Diagnosis not present

## 2016-09-22 DIAGNOSIS — R600 Localized edema: Secondary | ICD-10-CM

## 2016-09-22 HISTORY — DX: Localized edema: R60.0

## 2016-10-07 ENCOUNTER — Inpatient Hospital Stay (HOSPITAL_COMMUNITY)
Admission: EM | Admit: 2016-10-07 | Discharge: 2016-10-11 | DRG: 683 | Disposition: A | Discharge status: Home / Self Care | Payer: Commercial Managed Care - HMO | Attending: Internal Medicine | Admitting: Internal Medicine

## 2016-10-07 ENCOUNTER — Emergency Department (HOSPITAL_COMMUNITY): Payer: Commercial Managed Care - HMO

## 2016-10-07 ENCOUNTER — Encounter (HOSPITAL_COMMUNITY): Payer: Self-pay

## 2016-10-07 DIAGNOSIS — Z23 Encounter for immunization: Secondary | ICD-10-CM

## 2016-10-07 DIAGNOSIS — N289 Disorder of kidney and ureter, unspecified: Secondary | ICD-10-CM

## 2016-10-07 DIAGNOSIS — E8809 Other disorders of plasma-protein metabolism, not elsewhere classified: Secondary | ICD-10-CM | POA: Diagnosis present

## 2016-10-07 DIAGNOSIS — I482 Chronic atrial fibrillation: Secondary | ICD-10-CM | POA: Diagnosis not present

## 2016-10-07 DIAGNOSIS — R509 Fever, unspecified: Secondary | ICD-10-CM | POA: Diagnosis not present

## 2016-10-07 DIAGNOSIS — R7989 Other specified abnormal findings of blood chemistry: Secondary | ICD-10-CM | POA: Diagnosis present

## 2016-10-07 DIAGNOSIS — X58XXXA Exposure to other specified factors, initial encounter: Secondary | ICD-10-CM | POA: Diagnosis not present

## 2016-10-07 DIAGNOSIS — R740 Nonspecific elevation of levels of transaminase and lactic acid dehydrogenase [LDH]: Secondary | ICD-10-CM | POA: Diagnosis not present

## 2016-10-07 DIAGNOSIS — E782 Mixed hyperlipidemia: Secondary | ICD-10-CM | POA: Diagnosis present

## 2016-10-07 DIAGNOSIS — J9811 Atelectasis: Secondary | ICD-10-CM | POA: Diagnosis not present

## 2016-10-07 DIAGNOSIS — K529 Noninfective gastroenteritis and colitis, unspecified: Secondary | ICD-10-CM | POA: Diagnosis not present

## 2016-10-07 DIAGNOSIS — E119 Type 2 diabetes mellitus without complications: Secondary | ICD-10-CM

## 2016-10-07 DIAGNOSIS — E860 Dehydration: Secondary | ICD-10-CM | POA: Diagnosis present

## 2016-10-07 DIAGNOSIS — E785 Hyperlipidemia, unspecified: Secondary | ICD-10-CM | POA: Diagnosis present

## 2016-10-07 DIAGNOSIS — R011 Cardiac murmur, unspecified: Secondary | ICD-10-CM | POA: Diagnosis not present

## 2016-10-07 DIAGNOSIS — I10 Essential (primary) hypertension: Secondary | ICD-10-CM | POA: Diagnosis present

## 2016-10-07 DIAGNOSIS — E1129 Type 2 diabetes mellitus with other diabetic kidney complication: Secondary | ICD-10-CM | POA: Diagnosis not present

## 2016-10-07 DIAGNOSIS — Z7984 Long term (current) use of oral hypoglycemic drugs: Secondary | ICD-10-CM | POA: Diagnosis not present

## 2016-10-07 DIAGNOSIS — R7401 Elevation of levels of liver transaminase levels: Secondary | ICD-10-CM | POA: Diagnosis present

## 2016-10-07 DIAGNOSIS — E118 Type 2 diabetes mellitus with unspecified complications: Secondary | ICD-10-CM | POA: Diagnosis not present

## 2016-10-07 DIAGNOSIS — Z79899 Other long term (current) drug therapy: Secondary | ICD-10-CM | POA: Diagnosis not present

## 2016-10-07 DIAGNOSIS — E1165 Type 2 diabetes mellitus with hyperglycemia: Secondary | ICD-10-CM | POA: Diagnosis present

## 2016-10-07 DIAGNOSIS — E871 Hypo-osmolality and hyponatremia: Secondary | ICD-10-CM | POA: Diagnosis not present

## 2016-10-07 DIAGNOSIS — I82402 Acute embolism and thrombosis of unspecified deep veins of left lower extremity: Secondary | ICD-10-CM | POA: Diagnosis not present

## 2016-10-07 DIAGNOSIS — S36122A Contusion of gallbladder, initial encounter: Secondary | ICD-10-CM | POA: Diagnosis not present

## 2016-10-07 DIAGNOSIS — R112 Nausea with vomiting, unspecified: Secondary | ICD-10-CM | POA: Diagnosis not present

## 2016-10-07 DIAGNOSIS — R110 Nausea: Secondary | ICD-10-CM | POA: Diagnosis not present

## 2016-10-07 DIAGNOSIS — Z8249 Family history of ischemic heart disease and other diseases of the circulatory system: Secondary | ICD-10-CM

## 2016-10-07 DIAGNOSIS — K810 Acute cholecystitis: Secondary | ICD-10-CM | POA: Diagnosis not present

## 2016-10-07 DIAGNOSIS — R609 Edema, unspecified: Secondary | ICD-10-CM | POA: Diagnosis not present

## 2016-10-07 DIAGNOSIS — R0682 Tachypnea, not elsewhere classified: Secondary | ICD-10-CM | POA: Diagnosis not present

## 2016-10-07 DIAGNOSIS — Z7982 Long term (current) use of aspirin: Secondary | ICD-10-CM

## 2016-10-07 DIAGNOSIS — I5030 Unspecified diastolic (congestive) heart failure: Secondary | ICD-10-CM | POA: Diagnosis not present

## 2016-10-07 DIAGNOSIS — M6281 Muscle weakness (generalized): Secondary | ICD-10-CM | POA: Diagnosis not present

## 2016-10-07 DIAGNOSIS — N179 Acute kidney failure, unspecified: Principal | ICD-10-CM | POA: Diagnosis present

## 2016-10-07 DIAGNOSIS — D72829 Elevated white blood cell count, unspecified: Secondary | ICD-10-CM | POA: Diagnosis not present

## 2016-10-07 DIAGNOSIS — Z833 Family history of diabetes mellitus: Secondary | ICD-10-CM | POA: Diagnosis not present

## 2016-10-07 DIAGNOSIS — R945 Abnormal results of liver function studies: Secondary | ICD-10-CM | POA: Diagnosis not present

## 2016-10-07 LAB — CBC WITH DIFFERENTIAL/PLATELET
BASOS PCT: 0 %
Basophils Absolute: 0 10*3/uL (ref 0.0–0.1)
EOS ABS: 0.1 10*3/uL (ref 0.0–0.7)
Eosinophils Relative: 1 %
HCT: 39.3 % (ref 39.0–52.0)
HEMOGLOBIN: 13.3 g/dL (ref 13.0–17.0)
Lymphocytes Relative: 11 %
Lymphs Abs: 1.5 10*3/uL (ref 0.7–4.0)
MCH: 30 pg (ref 26.0–34.0)
MCHC: 33.8 g/dL (ref 30.0–36.0)
MCV: 88.5 fL (ref 78.0–100.0)
MONO ABS: 1 10*3/uL (ref 0.1–1.0)
MONOS PCT: 7 %
Neutro Abs: 11.4 10*3/uL — ABNORMAL HIGH (ref 1.7–7.7)
Neutrophils Relative %: 81 %
Platelets: 327 10*3/uL (ref 150–400)
RBC: 4.44 MIL/uL (ref 4.22–5.81)
RDW: 13.4 % (ref 11.5–15.5)
WBC: 14 10*3/uL — ABNORMAL HIGH (ref 4.0–10.5)

## 2016-10-07 LAB — COMPREHENSIVE METABOLIC PANEL
ALBUMIN: 2.8 g/dL — AB (ref 3.5–5.0)
ALK PHOS: 174 U/L — AB (ref 38–126)
ALT: 339 U/L — ABNORMAL HIGH (ref 17–63)
AST: 393 U/L — AB (ref 15–41)
Anion gap: 13 (ref 5–15)
BILIRUBIN TOTAL: 0.5 mg/dL (ref 0.3–1.2)
BUN: 84 mg/dL — AB (ref 6–20)
CALCIUM: 8.5 mg/dL — AB (ref 8.9–10.3)
CO2: 26 mmol/L (ref 22–32)
CREATININE: 4.03 mg/dL — AB (ref 0.61–1.24)
Chloride: 90 mmol/L — ABNORMAL LOW (ref 101–111)
GFR calc Af Amer: 15 mL/min — ABNORMAL LOW (ref >60)
GFR calc non Af Amer: 13 mL/min — ABNORMAL LOW (ref >60)
GLUCOSE: 304 mg/dL — AB (ref 65–99)
Potassium: 3.8 mmol/L (ref 3.5–5.1)
Sodium: 129 mmol/L — ABNORMAL LOW (ref 135–145)
TOTAL PROTEIN: 8.2 g/dL — AB (ref 6.5–8.1)

## 2016-10-07 LAB — TROPONIN I: Troponin I: <0.03 ng/mL (ref <0.03)

## 2016-10-07 LAB — LIPASE, BLOOD: LIPASE: 36 U/L (ref 11–51)

## 2016-10-07 MED ORDER — SODIUM CHLORIDE 0.9 % IV BOLUS (SEPSIS)
1000.0000 mL | Freq: Once | INTRAVENOUS | Status: AC
  Administered 2016-10-07: 1000 mL via INTRAVENOUS

## 2016-10-07 MED ORDER — SODIUM CHLORIDE 0.9 % IV BOLUS (SEPSIS)
500.0000 mL | Freq: Once | INTRAVENOUS | Status: AC
  Administered 2016-10-07: 500 mL via INTRAVENOUS

## 2016-10-07 NOTE — ED Notes (Signed)
Pt unable to void at this time, he says he will notify us when able.

## 2016-10-07 NOTE — ED Provider Notes (Signed)
[facility] DEPT Provider Note   CSN: [id number] Arrival date & time: 10/07/16  2111  By signing my name below, I, Royce Macadamia, attest that this documentation has been prepared under the direction and in the presence of Davonna Belling, MD . Electronically Signed: Royce Macadamia, Scribe. 10/07/2016. 9:43 PM.  History   Chief Complaint Chief Complaint  Patient presents with  . Weakness   The history is provided by the patient, medical records, the spouse and a caregiver. No language interpreter was used.     HPI Comments:  Stephen PEDROTTI Sr. is a 75 y.o. male brought in by ambulance, who presents to the Emergency Department complaining of weakness x 1 week.  Pt reports that one week ago he became nauseous and had a decreased appetite.  He states that both symptoms are relieved, but left his with a generalized weakness.  He reports that he feels fine and that he is slowly recovering.  His wife reports that pt has been lying on the couch and told her her he was too fatigued to get up or come to the ED tonight.  She also states pt has had decreased urine do to decreased fluid intake.  His caretaker adds that pt only ate and drank a small amount while she was watching him and may have noted slight slurred speech; she is unsure.  Pt denies dysuria, change in urine frequency, melena, blood in the stools, focal weakness, fever, chills coughing, sore throat, new swelling in legs, significant unexplained weight gain or loss and confusion   Past Medical History:  Diagnosis Date  . Diabetes mellitus   . Hypertension     There are no active problems to display for this patient.   History reviewed. No pertinent surgical history.     Home Medications    Prior to Admission medications   Medication Sig Start Date End Date Taking? Authorizing Provider  aspirin EC 81 MG tablet Take 81 mg by mouth daily.    Historical Provider, MD  cloNIDine (CATAPRES) 0.2 MG tablet Take 0.2 mg  by mouth daily.    Historical Provider, MD  glipiZIDE (GLUCOTROL) 10 MG tablet Take 10 mg by mouth 2 (two) times daily.    Historical Provider, MD  glucose blood test strip 1 each by Other route 2 (two) times daily. Use as instructed    Historical Provider, MD  hydrALAZINE (APRESOLINE) 25 MG tablet Take 25 mg by mouth 2 (two) times daily.    Historical Provider, MD  lisinopril (PRINIVIL,ZESTRIL) 20 MG tablet Take 20 mg by mouth daily.    Historical Provider, MD  metFORMIN (GLUCOPHAGE) 1000 MG tablet Take 1,000 mg by mouth 2 (two) times daily.    Historical Provider, MD  simvastatin (ZOCOR) 20 MG tablet Take 20 mg by mouth daily.    Historical Provider, MD  sitaGLIPtin (JANUVIA) 50 MG tablet Take 50 mg by mouth daily.    Historical Provider, MD  verapamil (CALAN-SR) 120 MG CR tablet Take 120 mg by mouth daily.    Historical Provider, MD    Family History Family History  Problem Relation Age of Onset  . Heart attack Other   . Tuberculosis Other   . Tuberculosis Other   . Diabetes type II Father   . Hypertension Father   . Heart attack Maternal Aunt     Social History Social History  Substance Use Topics  . Smoking status: Never Smoker  . Smokeless tobacco: Never Used  . Alcohol use No  Allergies   Patient has no known allergies.   Review of Systems Review of Systems  Constitutional: Positive for appetite change and fatigue. Negative for chills and fever.  HENT: Negative for sore throat.   Respiratory: Negative for cough.   Cardiovascular: Negative for leg swelling.  Gastrointestinal: Positive for nausea.  Genitourinary: Negative for decreased urine volume, dysuria and frequency.  Neurological: Positive for weakness.  Psychiatric/Behavioral: Negative for confusion.  All other systems reviewed and are negative.    Physical Exam Updated Vital Signs BP 155/80   Pulse (!) 53   Temp 99.1 F (37.3 C) (Oral)   Resp 19   SpO2 (!) 86%   Physical Exam  Constitutional:  He is oriented to person, place, and time. He appears well-developed and well-nourished. No distress.  HENT:  Head: Normocephalic and atraumatic.  Eyes: Conjunctivae are normal.  Cardiovascular: Normal rate.   Pulmonary/Chest: Effort normal and breath sounds normal. No respiratory distress. He has no wheezes. He has no rales.  Abdominal: There is no tenderness.  Musculoskeletal: He exhibits no edema.  No peripheral edema.    Neurological: He is alert and oriented to person, place, and time.  Skin: Skin is warm and dry. No pallor.  Psychiatric: He has a normal mood and affect.  Nursing note and vitals reviewed.    ED Treatments / Results   COORDINATION OF CARE:  9:37 PM Discussed treatment plan with pt at bedside and pt agreed to plan.  Labs (all labs ordered are listed, but only abnormal results are displayed) Labs Reviewed  COMPREHENSIVE METABOLIC PANEL - Abnormal; Notable for the following:       Result Value   Sodium 129 (*)    Chloride 90 (*)    Glucose, Bld 304 (*)    BUN 84 (*)    Creatinine, Ser 4.03 (*)    Calcium 8.5 (*)    Total Protein 8.2 (*)    Albumin 2.8 (*)    AST 393 (*)    ALT 339 (*)    Alkaline Phosphatase 174 (*)    GFR calc non Af Amer 13 (*)    GFR calc Af Amer 15 (*)    All other components within normal limits  CBC WITH DIFFERENTIAL/PLATELET - Abnormal; Notable for the following:    WBC 14.0 (*)    Neutro Abs 11.4 (*)    All other components within normal limits  TROPONIN I  URINALYSIS, ROUTINE W REFLEX MICROSCOPIC (NOT AT [facility])    EKG  EKG Interpretation None       Radiology Dg Chest 2 View  Result Date: 10/07/2016 CLINICAL DATA:  Worsening weakness over the past week. History of hypertension and diabetes. Nonsmoker. EXAM: CHEST  2 VIEW COMPARISON:  01/04/2012 FINDINGS: The lung volumes are low accounting for crowding of interstitial lung markings and right basilar atelectasis. Slight elevation of the right hemidiaphragm relative  to left. The aorta is slightly uncoiled in appearance without definite aneurysm. No pneumonic consolidation, CHF nor effusion. No suspicious osseous lesions. 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 IMPRESSION: Low lung volumes with crowding of interstitial lung markings and right basilar atelectasis. No acute cardiopulmonary disease. Electronically Signed   By: Ashley Royalty M.D.   On: 10/07/2016 22:24    Procedures Procedures (including critical care time)  Medications Ordered in ED Medications  sodium chloride 0.9 % bolus 500 mL (0 mLs Intravenous Stopped 10/07/16 2238)     Initial Impression / Assessment and Plan / ED Course  I have reviewed the triage vital signs and the nursing notes.  Pertinent labs & imaging results that were available during my care of the patient were reviewed by me and considered in my medical decision making (see chart for details).  Clinical Course     Patient presents with nausea and decreased oral intake for the last couple weeks. States he is feeling little better today. Found to have hyperglycemia and a new renal failure. Creatinine of 4. Has not given urine yet however on bedside ultrasound there is not a distended bladder. Will admit to internal medicine. Will hydrate and patient does not appear to be in DKA.  Final Clinical Impressions(s) / ED Diagnoses   Final diagnoses:  Renal insufficiency  Dehydration    New Prescriptions New Prescriptions   No medications on file   I personally performed the services described in this documentation, which was scribed in my presence. The recorded information has been reviewed and is accurate.      Davonna Belling, MD 10/07/16 [phone]

## 2016-10-07 NOTE — ED Triage Notes (Signed)
Pt states he had been not feeling well for a couple of weeks, but states he recently has been feeling a little better.  Pt denies pain or complaint other than generalized weakness.  Pt states he is here because his family felt he was not better

## 2016-10-07 NOTE — ED Notes (Signed)
Pt still unable to urinate. Drinking & eating now.

## 2016-10-08 ENCOUNTER — Inpatient Hospital Stay (HOSPITAL_COMMUNITY): Payer: Commercial Managed Care - HMO

## 2016-10-08 DIAGNOSIS — R740 Nonspecific elevation of levels of transaminase and lactic acid dehydrogenase [LDH]: Secondary | ICD-10-CM

## 2016-10-08 DIAGNOSIS — E118 Type 2 diabetes mellitus with unspecified complications: Secondary | ICD-10-CM

## 2016-10-08 DIAGNOSIS — E119 Type 2 diabetes mellitus without complications: Secondary | ICD-10-CM

## 2016-10-08 DIAGNOSIS — E782 Mixed hyperlipidemia: Secondary | ICD-10-CM | POA: Diagnosis present

## 2016-10-08 DIAGNOSIS — D72829 Elevated white blood cell count, unspecified: Secondary | ICD-10-CM

## 2016-10-08 DIAGNOSIS — R112 Nausea with vomiting, unspecified: Secondary | ICD-10-CM | POA: Diagnosis not present

## 2016-10-08 DIAGNOSIS — E1129 Type 2 diabetes mellitus with other diabetic kidney complication: Secondary | ICD-10-CM

## 2016-10-08 DIAGNOSIS — E860 Dehydration: Secondary | ICD-10-CM

## 2016-10-08 DIAGNOSIS — R7401 Elevation of levels of liver transaminase levels: Secondary | ICD-10-CM | POA: Diagnosis present

## 2016-10-08 DIAGNOSIS — N289 Disorder of kidney and ureter, unspecified: Secondary | ICD-10-CM

## 2016-10-08 DIAGNOSIS — E785 Hyperlipidemia, unspecified: Secondary | ICD-10-CM

## 2016-10-08 DIAGNOSIS — N179 Acute kidney failure, unspecified: Secondary | ICD-10-CM | POA: Diagnosis present

## 2016-10-08 LAB — URINE MICROSCOPIC-ADD ON

## 2016-10-08 LAB — GLUCOSE, CAPILLARY
GLUCOSE-CAPILLARY: 168 mg/dL — AB (ref 65–99)
GLUCOSE-CAPILLARY: 181 mg/dL — AB (ref 65–99)
Glucose-Capillary: 283 mg/dL — ABNORMAL HIGH (ref 65–99)
Glucose-Capillary: 242 mg/dL — ABNORMAL HIGH (ref 65–99)
Glucose-Capillary: 339 mg/dL — ABNORMAL HIGH (ref 65–99)

## 2016-10-08 LAB — CBC
HEMATOCRIT: 36.2 % — AB (ref 39.0–52.0)
HEMOGLOBIN: 12.1 g/dL — AB (ref 13.0–17.0)
MCH: 29.4 pg (ref 26.0–34.0)
MCHC: 33.4 g/dL (ref 30.0–36.0)
MCV: 87.9 fL (ref 78.0–100.0)
Platelets: 302 10*3/uL (ref 150–400)
RBC: 4.12 MIL/uL — ABNORMAL LOW (ref 4.22–5.81)
RDW: 13.4 % (ref 11.5–15.5)
WBC: 12.5 10*3/uL — AB (ref 4.0–10.5)

## 2016-10-08 LAB — COMPREHENSIVE METABOLIC PANEL
ALBUMIN: 2.3 g/dL — AB (ref 3.5–5.0)
ALT: 271 U/L — ABNORMAL HIGH (ref 17–63)
ANION GAP: 10 (ref 5–15)
AST: 268 U/L — AB (ref 15–41)
Alkaline Phosphatase: 139 U/L — ABNORMAL HIGH (ref 38–126)
BILIRUBIN TOTAL: 0.4 mg/dL (ref 0.3–1.2)
BUN: 72 mg/dL — AB (ref 6–20)
CHLORIDE: 100 mmol/L — AB (ref 101–111)
CO2: 24 mmol/L (ref 22–32)
Calcium: 7.9 mg/dL — ABNORMAL LOW (ref 8.9–10.3)
Creatinine, Ser: 3.25 mg/dL — ABNORMAL HIGH (ref 0.61–1.24)
GFR calc Af Amer: 20 mL/min — ABNORMAL LOW (ref >60)
GFR calc non Af Amer: 17 mL/min — ABNORMAL LOW (ref >60)
GLUCOSE: 198 mg/dL — AB (ref 65–99)
POTASSIUM: 3.7 mmol/L (ref 3.5–5.1)
Sodium: 134 mmol/L — ABNORMAL LOW (ref 135–145)
TOTAL PROTEIN: 6.7 g/dL (ref 6.5–8.1)

## 2016-10-08 LAB — URINALYSIS, ROUTINE W REFLEX MICROSCOPIC
BILIRUBIN URINE: NEGATIVE
GLUCOSE, UA: 250 mg/dL — AB
Ketones, ur: NEGATIVE mg/dL
Leukocytes, UA: NEGATIVE
Nitrite: NEGATIVE
PH: 5 (ref 5.0–8.0)
SPECIFIC GRAVITY, URINE: 1.02 (ref 1.005–1.030)

## 2016-10-08 MED ORDER — INSULIN ASPART 100 UNIT/ML ~~LOC~~ SOLN
0.0000 [IU] | Freq: Every day | SUBCUTANEOUS | Status: DC
  Administered 2016-10-08: 3 [IU] via SUBCUTANEOUS
  Administered 2016-10-08 – 2016-10-11 (×3): 2 [IU] via SUBCUTANEOUS

## 2016-10-08 MED ORDER — SODIUM CHLORIDE 0.9 % IV SOLN
INTRAVENOUS | Status: DC
  Administered 2016-10-08 – 2016-10-10 (×7): via INTRAVENOUS

## 2016-10-08 MED ORDER — ONDANSETRON HCL 4 MG/2ML IJ SOLN: 4.0000 mg | Freq: Four times a day (QID) | INTRAMUSCULAR | Status: DC | PRN

## 2016-10-08 MED ORDER — CLONIDINE HCL 0.2 MG PO TABS
0.2000 mg | ORAL_TABLET | Freq: Every evening | ORAL | Status: DC
  Administered 2016-10-08 – 2016-10-10 (×3): 0.2 mg via ORAL
  Filled 2016-10-08 (×3): qty 1

## 2016-10-08 MED ORDER — PANTOPRAZOLE SODIUM 40 MG PO TBEC
40.0000 mg | DELAYED_RELEASE_TABLET | Freq: Every day | ORAL | Status: DC
Start: 2016-10-09 — End: 2016-10-11
  Administered 2016-10-09 – 2016-10-11 (×3): 40 mg via ORAL
  Filled 2016-10-08 (×3): qty 1

## 2016-10-08 MED ORDER — ONDANSETRON HCL 4 MG PO TABS: 4.0000 mg | ORAL_TABLET | Freq: Four times a day (QID) | ORAL | Status: DC | PRN

## 2016-10-08 MED ORDER — VERAPAMIL HCL ER 240 MG PO TBCR
240.0000 mg | EXTENDED_RELEASE_TABLET | Freq: Every day | ORAL | Status: DC
  Administered 2016-10-08 – 2016-10-10 (×4): 240 mg via ORAL
  Filled 2016-10-08 (×4): qty 1

## 2016-10-08 MED ORDER — ASPIRIN EC 81 MG PO TBEC
81.0000 mg | DELAYED_RELEASE_TABLET | Freq: Every evening | ORAL | Status: DC
  Administered 2016-10-08 – 2016-10-10 (×3): 81 mg via ORAL
  Filled 2016-10-08 (×3): qty 1

## 2016-10-08 MED ORDER — ALBUTEROL SULFATE (2.5 MG/3ML) 0.083% IN NEBU: 2.5000 mg | INHALATION_SOLUTION | RESPIRATORY_TRACT | Status: DC | PRN

## 2016-10-08 MED ORDER — HYDRALAZINE HCL 25 MG PO TABS
25.0000 mg | ORAL_TABLET | Freq: Every evening | ORAL | Status: DC
  Administered 2016-10-08 – 2016-10-10 (×3): 25 mg via ORAL
  Filled 2016-10-08 (×3): qty 1

## 2016-10-08 MED ORDER — INSULIN ASPART 100 UNIT/ML ~~LOC~~ SOLN
0.0000 [IU] | Freq: Three times a day (TID) | SUBCUTANEOUS | Status: DC
  Administered 2016-10-08: 2 [IU] via SUBCUTANEOUS
  Administered 2016-10-08: 3 [IU] via SUBCUTANEOUS
  Administered 2016-10-08: 7 [IU] via SUBCUTANEOUS
  Administered 2016-10-09: 2 [IU] via SUBCUTANEOUS
  Administered 2016-10-09: 3 [IU] via SUBCUTANEOUS
  Administered 2016-10-09: 5 [IU] via SUBCUTANEOUS
  Administered 2016-10-10 (×3): 2 [IU] via SUBCUTANEOUS
  Administered 2016-10-11: 3 [IU] via SUBCUTANEOUS
  Administered 2016-10-11: 2 [IU] via SUBCUTANEOUS

## 2016-10-08 MED ORDER — HEPARIN SODIUM (PORCINE) 5000 UNIT/ML IJ SOLN
5000.0000 [IU] | Freq: Three times a day (TID) | INTRAMUSCULAR | Status: DC
  Administered 2016-10-08 – 2016-10-11 (×10): 5000 [IU] via SUBCUTANEOUS
  Filled 2016-10-08 (×10): qty 1

## 2016-10-08 MED ORDER — INFLUENZA VAC SPLIT QUAD 0.5 ML IM SUSY
0.5000 mL | PREFILLED_SYRINGE | INTRAMUSCULAR | Status: AC
  Administered 2016-10-09: 0.5 mL via INTRAMUSCULAR
  Filled 2016-10-08: qty 0.5

## 2016-10-08 NOTE — Consult Note (Signed)
Referring Provider: No ref. provider found Primary Care Physician:  [name], MD Primary Gastroenterologist:  Dr. [name]  Date of Admission: 10/07/16 Date of Consultation: 10/08/16  Reason for Consultation:  Elevated LFTs  HPI:  Stephen Rogers Sr. is a 75 y.o. male with a past medical history of DM and hypertension who presented to the ER complaining of weakness. ER notes reviewed. Per ER notes patient came via ambulance for 1 week of weakness and recently with nausea, vomiting, decreased appetite. Also noted decrease fluid intake and decreased urine output. Labs in the ER with elevated Cr 4.03, hyponatremia 129, elevated AST/ALT 393/339, elevated Alk Phos 174, normal bili 0.35. CBC with leucocytosis 14.0 with normal H/H. Lipase normal. His symptoms of abdominal pain, N/V, diarrhea improved before ER presentation. He was admitted for dehydration and renal insufficiency.  Today he feels better. Denies abdominal pain, N/BV, diarrhea, hematochezia, melena. His Cr has improved to 3.25, AST/ALT improved to 268/271, alk phos improved to 139. Bili remains normal. Denies ETOH, IV drug use, tattoos, other exposure.  Past Medical History:  Diagnosis Date  . Diabetes mellitus   . Hypertension     History reviewed. No pertinent surgical history.  Prior to Admission medications   Medication Sig Start Date End Date Taking? Authorizing Provider  aspirin EC 81 MG tablet Take 81 mg by mouth every evening.    Yes Historical Provider, MD  cloNIDine (CATAPRES) 0.2 MG tablet Take 0.2 mg by mouth every evening.    Yes Historical Provider, MD  furosemide (LASIX) 20 MG tablet Take 20 mg by mouth every evening.   Yes Historical Provider, MD  glipiZIDE (GLUCOTROL) 10 MG tablet Take 10 mg by mouth every evening.    Yes Historical Provider, MD  hydrALAZINE (APRESOLINE) 25 MG tablet Take 25 mg by mouth every evening.    Yes Historical Provider, MD  lisinopril (PRINIVIL,ZESTRIL) 40 MG tablet Take 20 mg by  mouth every evening.    Yes Historical Provider, MD  metFORMIN (GLUCOPHAGE) 1000 MG tablet Take 1,000 mg by mouth every evening.    Yes Historical Provider, MD  simvastatin (ZOCOR) 40 MG tablet Take 40 mg by mouth every evening.    Yes Historical Provider, MD  verapamil (CALAN-SR) 240 MG CR tablet Take 240 mg by mouth at bedtime.   Yes Historical Provider, MD  glucose blood test strip 1 each by Other route 2 (two) times daily. Use as instructed    Historical Provider, MD    Current Facility-Administered Medications  Medication Dose Route Frequency Provider Last Rate Last Dose  . 0.9 %  sodium chloride infusion   Intravenous Continuous Fran Lowes, MD 135 mL/hr at 10/08/16 1219    . albuterol (PROVENTIL) (2.5 MG/3ML) 0.083% nebulizer solution 2.5 mg  2.5 mg Nebulization Q2H PRN Norval Morton, MD      . aspirin EC tablet 81 mg  81 mg Oral QPM Rondell A Tamala Julian, MD      . cloNIDine (CATAPRES) tablet 0.2 mg  0.2 mg Oral QPM Rondell A Tamala Julian, MD      . heparin injection 5,000 Units  5,000 Units Subcutaneous Q8H Norval Morton, MD   5,000 Units at 10/08/16 0541  . hydrALAZINE (APRESOLINE) tablet 25 mg  25 mg Oral QPM Rondell A Tamala Julian, MD      . Derrill Memo ON 10/09/2016] Influenza vac split quadrivalent PF (FLUARIX) injection 0.5 mL  0.5 mL Intramuscular Tomorrow-1000 Rondell A Smith, MD      . insulin aspart (novoLOG)  injection 0-5 Units  0-5 Units Subcutaneous QHS Norval Morton, MD   3 Units at 10/08/16 0112  . insulin aspart (novoLOG) injection 0-9 Units  0-9 Units Subcutaneous TID WC Norval Morton, MD   7 Units at 10/08/16 1218  . ondansetron (ZOFRAN) tablet 4 mg  4 mg Oral Q6H PRN Norval Morton, MD       Or  . ondansetron (ZOFRAN) injection 4 mg  4 mg Intravenous Q6H PRN Norval Morton, MD      . verapamil (CALAN-SR) CR tablet 240 mg  240 mg Oral QHS Norval Morton, MD   240 mg at 10/08/16 0112    Allergies as of 10/07/2016  . (No Known Allergies)    Family History  Problem  Relation Age of Onset  . Heart attack Other   . Tuberculosis Other   . Tuberculosis Other   . Diabetes type II Father   . Hypertension Father   . Heart attack Maternal Aunt     Social History   Social History  . Marital status: Widowed    Spouse name: N/A  . Number of children: N/A  . Years of education: N/A   Occupational History  . Not on file.   Social History Main Topics  . Smoking status: Never Smoker  . Smokeless tobacco: Never Used  . Alcohol use No  . Drug use: No  . Sexual activity: Not on file   Other Topics Concern  . Not on file   Social History Narrative  . No narrative on file    Review of Systems: General: Negative for anorexia, weight loss, fever, chills, fatigue, weakness. Eyes: Negative for vision changes.  ENT: Negative for hoarseness, difficulty swallowing , nasal congestion. CV: Negative for chest pain, angina, palpitations, dyspnea on exertion, peripheral edema.  Respiratory: Negative for dyspnea at rest, dyspnea on exertion, cough, sputum, wheezing.  GI: See history of present illness. GU:  Negative for dysuria, hematuria, urinary incontinence, urinary frequency, nocturnal urination.  MS: Negative for joint pain, low back pain.  Derm: Negative for rash or itching.  Neuro: Negative for weakness, abnormal sensation, seizure, frequent headaches, memory loss, confusion.  Psych: Negative for anxiety, depression, suicidal ideation, hallucinations.  Endo: Negative for unusual weight change.  Heme: Negative for bruising or bleeding. Allergy: Negative for rash or hives.  Physical Exam: Vital signs in last 24 hours: Temp:  [98.3 F (36.8 C)-99.1 F (37.3 C)] 98.3 F (36.8 C) (11/17 1514) Pulse Rate:  [53-103] 98 (11/17 1514) Resp:  [15-23] 18 (11/17 1514) BP: (133-157)/(72-90) 156/90 (11/17 1514) SpO2:  [86 %-98 %] 97 % (11/17 1514) Weight:  [195 lb (88.5 kg)] 195 lb (88.5 kg) (11/17 0100)   General:   Alert,  Well-developed, well-nourished,  pleasant and cooperative in NAD Head:  Normocephalic and atraumatic. Eyes:  Sclera clear, no icterus.   Conjunctiva pink. Ears:  Normal auditory acuity. Nose:  No deformity, discharge,  or lesions. Mouth:  No deformity or lesions, dentition normal. Neck:  Supple; no masses or thyromegaly. Lungs:  Clear throughout to auscultation.   No wheezes, crackles, or rhonchi. No acute distress. Heart:  Regular rate and rhythm; no murmurs, clicks, rubs,  or gallops. Abdomen:  Soft, nontender and nondistended. No masses, hepatosplenomegaly or hernias noted. Normal bowel sounds, without guarding, and without rebound.   Rectal:  Deferred until time of colonoscopy.   Msk:  Symmetrical without gross deformities. Normal posture. Pulses:  Normal pulses noted. Extremities:  Without clubbing  or edema. Neurologic:  Alert and  oriented x4;  grossly normal neurologically. Skin:  Intact without significant lesions or rashes. Cervical Nodes:  No significant cervical adenopathy. Psych:  Alert and cooperative. Normal mood and affect.  Intake/Output from previous day: 11/16 0701 - 11/17 0700 In: 1953.3 [I.V.:453.3; IV Piggyback:1500] Out: 150 [Urine:150] Intake/Output this shift: Total I/O In: 1280 [P.O.:480; I.V.:800] Out: 1000 [Urine:1000]  Lab Results:  Recent Labs  10/07/16 2153 10/08/16 0620  WBC 14.0* 12.5*  HGB 13.3 12.1*  HCT 39.3 36.2*  PLT 327 302   BMET  Recent Labs  10/07/16 2153 10/08/16 0620  NA 129* 134*  K 3.8 3.7  CL 90* 100*  CO2 26 24  GLUCOSE 304* 198*  BUN 84* 72*  CREATININE 4.03* 3.25*  CALCIUM 8.5* 7.9*   LFT  Recent Labs  10/07/16 2153 10/08/16 0620  PROT 8.2* 6.7  ALBUMIN 2.8* 2.3*  AST 393* 268*  ALT 339* 271*  ALKPHOS 174* 139*  BILITOT 0.5 0.4   PT/INR No results for input(s): LABPROT, INR in the last 72 hours. Hepatitis Panel No results for input(s): HEPBSAG, HCVAB, HEPAIGM, HEPBIGM in the last 72 hours. C-Diff No results for input(s): CDIFFTOX  in the last 72 hours.  Studies/Results: Dg Chest 2 View  Result Date: 10/07/2016 CLINICAL DATA:  Worsening weakness over the past week. History of hypertension and diabetes. Nonsmoker. EXAM: CHEST  2 VIEW COMPARISON:  01/04/2012 FINDINGS: The lung volumes are low accounting for crowding of interstitial lung markings and right basilar atelectasis. Slight elevation of the right hemidiaphragm relative to left. The aorta is slightly uncoiled in appearance without definite aneurysm. No pneumonic consolidation, CHF nor effusion. No suspicious osseous lesions. 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 IMPRESSION: Low lung volumes with crowding of interstitial lung markings and right basilar atelectasis. No acute cardiopulmonary disease. Electronically Signed   By: Ashley Royalty M.D.   On: 10/07/2016 22:24   US Renal  Result Date: 10/08/2016 CLINICAL DATA:  Acute renal failure. EXAM: RENAL / URINARY TRACT ULTRASOUND COMPLETE COMPARISON:  CT abdomen and pelvis 01/04/2012. FINDINGS: Right Kidney: Length: 11.2 cm. Echogenicity within normal limits. No mass or hydronephrosis visualized. Incidental note is made of sludge and tiny stones within the gallbladder. No evidence of cholecystitis is present on study. Left Kidney: Length: 10.0 cm. Echogenicity within normal limits. No hydronephrosis visualized. Cyst in the midpole measures 3.5 cm in diameter. There may be a single thin septation the cyst. Bladder: Appears normal for degree of bladder distention. IMPRESSION: No acute abnormality.  Negative for hydronephrosis. Incidental note is made of sludge and small stones in the gallbladder. Finding is present on the prior CT. Electronically Signed   By: Inge Rise M.D.   On: 10/08/2016 10:00    Impression: 75 year old amle with likely recent limited gastroenteritis with decreased intake, N/V (resolved), and diarrhea (resolved) who presented with weakness. Noted elevated LFTs. Admitted for dehydration and  subsequent AKI with Cr 4.0 (baseline 1.0). He is being seen by Nephrology and given IV fluids. He feels better today.  LFTs improving. Viral hepatitis labs pending. Doubt obstructive etiology, unlikely ETOH or viral related. More likely ischemic liver related to dehydration and AKI. Clinically improving.   Plan: 1. Monitor LFTs (check tomorrow for continued decline) 2. Watch for viral hepatitis lab results. 3. Continue supportive measures 4. Will likely need outpatient follow-up to trend labs to normal and consider any additional needed workup 5. We will continue to follow  Thank you for allowing [place] to participate in the care of Sandria Bales Sr.  Walden Field, DNP, AGNP-C Adult & Gerontological Nurse Practitioner [facility]    LOS: 1 day     10/08/2016, 3:32 PM

## 2016-10-08 NOTE — Progress Notes (Signed)
Inpatient Diabetes Program Recommendations  AACE/ADA: New Consensus Statement on Inpatient Glycemic Control (2015)  Target Ranges:  Prepandial:   less than 140 mg/dL      Peak postprandial:   less than 180 mg/dL (1-2 hours)      Critically ill patients:  140 - 180 mg/dL  Results for Stephen Rogers, Stephen SR. (MRN [id number]) as of 10/08/2016 09:04  Ref. Range 10/08/2016 01:03 10/08/2016 08:39  Glucose-Capillary Latest Ref Range: 65 - 99 mg/dL 283 (H) 242 (H)    Review of Glycemic Control  Diabetes history: DM2 Outpatient Diabetes medications: Glipizide 10 mg QPM, Metformin 1000 mg QPM Current orders for Inpatient glycemic control: Novolog 0-9 units TID with meals, Novolog 0-5 units QHS  Inpatient Diabetes Program Recommendations: Insulin - Basal: Please consider ordering Lantus 5 units Q24H. Correction (SSI): Please consider increasing Novolog correction to moderate scale.  Thanks, Barnie Alderman, RN, MSN, CDE Diabetes Coordinator Inpatient Diabetes Program [phone] (Team Pager from 8am to 5pm)

## 2016-10-08 NOTE — Progress Notes (Signed)
Nutrition Brief Note  Patient identified on the Malnutrition Screening Tool (MST) Report  Possible gastroenteritis: Resolved per MD.   Stephen Rogers says he is feeling much better. He is eating well >75% of his heart healthy meals today. Feeds himself.   Wt Readings from Last 15 Encounters:  10/08/16 195 lb (88.5 kg)  01/04/12 202 lb (91.6 kg)    Body mass index is 31.47 kg/m. Patient meets criteria for obesity class I based on current BMI. His weight hx is without significant change.   Recent Labs Lab 10/07/16 2153 10/08/16 0620  NA 129* 134*  K 3.8 3.7  CL 90* 100*  CO2 26 24  BUN 84* 72*  CREATININE 4.03* 3.25*  CALCIUM 8.5* 7.9*  GLUCOSE 304* 198*    Labs and medications reviewed. BUN/Cr and glucose elevated  No nutrition interventions warranted at this time. According to staff he will likely be discharged over the weekend.   Colman Cater MS,RD,CSG,LDN Office: [phone] Pager: [phone]

## 2016-10-08 NOTE — Care Management Important Message (Signed)
Important Message  Patient Details  Name: Stephen MURK Sr. MRN: [id number] Date of Birth: 02/05/1941   Medicare Important Message Given:  Yes    Erva Koke, Chauncey Reading, RN 10/08/2016, 12:51 PM

## 2016-10-08 NOTE — Consult Note (Signed)
Reason for Consult: Acute kidney injury Referring Physician: Dr. Vallery Ridge Sr. is an 75 y.o. male.  HPI: He is a patient with history of hypertension, diabetes, presently came completion of nausea and vomiting for the last couple of days. Patient denies any abdominal pain or diarrhea. He accomplished with decrease your note and unable to keep anything down. Patient doesn't have any fever, chills or sweating. He denies any previous history of renal failure. Presently patient with elevated BUN and creatinine, and LFTs. Patient says that she is feeling much better and improved. This morning. He denies any difficulty breathing.  Past Medical History:  Diagnosis Date  . Diabetes mellitus   . Hypertension     History reviewed. No pertinent surgical history.  Family History  Problem Relation Age of Onset  . Heart attack Other   . Tuberculosis Other   . Tuberculosis Other   . Diabetes type II Father   . Hypertension Father   . Heart attack Maternal Aunt     Social History:  reports that he has never smoked. He has never used smokeless tobacco. He reports that he does not drink alcohol or use drugs.  Allergies: No Known Allergies  Medications: I have reviewed the patient's current medications.  Results for orders placed or performed during the hospital encounter of 10/07/16 (from the past 48 hour(s))  Comprehensive metabolic panel     Status: Abnormal   Collection Time: 10/07/16  9:53 PM  Result Value Ref Range   Sodium 129 (L) 135 - 145 mmol/L   Potassium 3.8 3.5 - 5.1 mmol/L   Chloride 90 (L) 101 - 111 mmol/L   CO2 26 22 - 32 mmol/L   Glucose, Bld 304 (H) 65 - 99 mg/dL   BUN 84 (H) 6 - 20 mg/dL   Creatinine, Ser 4.03 (H) 0.61 - 1.24 mg/dL   Calcium 8.5 (L) 8.9 - 10.3 mg/dL   Total Protein 8.2 (H) 6.5 - 8.1 g/dL   Albumin 2.8 (L) 3.5 - 5.0 g/dL   AST 393 (H) 15 - 41 U/L   ALT 339 (H) 17 - 63 U/L   Alkaline Phosphatase 174 (H) 38 - 126 U/L   Total Bilirubin 0.5 0.3  - 1.2 mg/dL   GFR calc non Af Amer 13 (L) >60 mL/min   GFR calc Af Amer 15 (L) >60 mL/min    Comment: (NOTE) The eGFR has been calculated using the CKD EPI equation. This calculation has not been validated in all clinical situations. eGFR's persistently <60 mL/min signify possible Chronic Kidney Disease.    Anion gap 13 5 - 15  Troponin I     Status: None   Collection Time: 10/07/16  9:53 PM  Result Value Ref Range   Troponin I <0.03 <0.03 ng/mL  CBC with Differential     Status: Abnormal   Collection Time: 10/07/16  9:53 PM  Result Value Ref Range   WBC 14.0 (H) 4.0 - 10.5 K/uL   RBC 4.44 4.22 - 5.81 MIL/uL   Hemoglobin 13.3 13.0 - 17.0 g/dL   HCT 39.3 39.0 - 52.0 %   MCV 88.5 78.0 - 100.0 fL   MCH 30.0 26.0 - 34.0 pg   MCHC 33.8 30.0 - 36.0 g/dL   RDW 13.4 11.5 - 15.5 %   Platelets 327 150 - 400 K/uL   Neutrophils Relative % 81 %   Neutro Abs 11.4 (H) 1.7 - 7.7 K/uL   Lymphocytes Relative 11 %  Lymphs Abs 1.5 0.7 - 4.0 K/uL   Monocytes Relative 7 %   Monocytes Absolute 1.0 0.1 - 1.0 K/uL   Eosinophils Relative 1 %   Eosinophils Absolute 0.1 0.0 - 0.7 K/uL   Basophils Relative 0 %   Basophils Absolute 0.0 0.0 - 0.1 K/uL   WBC Morphology WHITE COUNT CONFIRMED ON SMEAR   Lipase, blood     Status: None   Collection Time: 10/07/16  9:53 PM  Result Value Ref Range   Lipase 36 11 - 51 U/L  Glucose, capillary     Status: Abnormal   Collection Time: 10/08/16  1:03 AM  Result Value Ref Range   Glucose-Capillary 283 (H) 65 - 99 mg/dL   Comment 1 Notify RN    Comment 2 Document in Chart   Urinalysis, Routine w reflex microscopic     Status: Abnormal   Collection Time: 10/08/16  2:13 AM  Result Value Ref Range   Color, Urine YELLOW YELLOW   APPearance HAZY (A) CLEAR   Specific Gravity, Urine 1.020 1.005 - 1.030   pH 5.0 5.0 - 8.0   Glucose, UA 250 (A) NEGATIVE mg/dL   Hgb urine dipstick TRACE (A) NEGATIVE   Bilirubin Urine NEGATIVE NEGATIVE   Ketones, ur NEGATIVE  NEGATIVE mg/dL   Protein, ur TRACE (A) NEGATIVE mg/dL   Nitrite NEGATIVE NEGATIVE   Leukocytes, UA NEGATIVE NEGATIVE  Urine microscopic-add on     Status: Abnormal   Collection Time: 10/08/16  2:13 AM  Result Value Ref Range   Squamous Epithelial / LPF 0-5 (A) NONE SEEN   WBC, UA 0-5 0 - 5 WBC/hpf   RBC / HPF 0-5 0 - 5 RBC/hpf   Bacteria, UA MANY (A) NONE SEEN   Casts RED CELL CAST (A) NEGATIVE    Comment: HYALINE CASTS   Urine-Other HEMOSIDIREN   Comprehensive metabolic panel     Status: Abnormal   Collection Time: 10/08/16  6:20 AM  Result Value Ref Range   Sodium 134 (L) 135 - 145 mmol/L   Potassium 3.7 3.5 - 5.1 mmol/L   Chloride 100 (L) 101 - 111 mmol/L   CO2 24 22 - 32 mmol/L   Glucose, Bld 198 (H) 65 - 99 mg/dL   BUN 72 (H) 6 - 20 mg/dL   Creatinine, Ser 3.25 (H) 0.61 - 1.24 mg/dL   Calcium 7.9 (L) 8.9 - 10.3 mg/dL   Total Protein 6.7 6.5 - 8.1 g/dL   Albumin 2.3 (L) 3.5 - 5.0 g/dL   AST 268 (H) 15 - 41 U/L   ALT 271 (H) 17 - 63 U/L   Alkaline Phosphatase 139 (H) 38 - 126 U/L   Total Bilirubin 0.4 0.3 - 1.2 mg/dL   GFR calc non Af Amer 17 (L) >60 mL/min   GFR calc Af Amer 20 (L) >60 mL/min    Comment: (NOTE) The eGFR has been calculated using the CKD EPI equation. This calculation has not been validated in all clinical situations. eGFR's persistently <60 mL/min signify possible Chronic Kidney Disease.    Anion gap 10 5 - 15  CBC     Status: Abnormal   Collection Time: 10/08/16  6:20 AM  Result Value Ref Range   WBC 12.5 (H) 4.0 - 10.5 K/uL   RBC 4.12 (L) 4.22 - 5.81 MIL/uL   Hemoglobin 12.1 (L) 13.0 - 17.0 g/dL   HCT 36.2 (L) 39.0 - 52.0 %   MCV 87.9 78.0 - 100.0 fL  MCH 29.4 26.0 - 34.0 pg   MCHC 33.4 30.0 - 36.0 g/dL   RDW 13.4 11.5 - 15.5 %   Platelets 302 150 - 400 K/uL  Glucose, capillary     Status: Abnormal   Collection Time: 10/08/16  8:39 AM  Result Value Ref Range   Glucose-Capillary 242 (H) 65 - 99 mg/dL  Glucose, capillary     Status:  Abnormal   Collection Time: 10/08/16 11:15 AM  Result Value Ref Range   Glucose-Capillary 339 (H) 65 - 99 mg/dL   Comment 1 Notify RN     Dg Chest 2 View  Result Date: 10/07/2016 CLINICAL DATA:  Worsening weakness over the past week. History of hypertension and diabetes. Nonsmoker. EXAM: CHEST  2 VIEW COMPARISON:  01/04/2012 FINDINGS: The lung volumes are low accounting for crowding of interstitial lung markings and right basilar atelectasis. Slight elevation of the right hemidiaphragm relative to left. The aorta is slightly uncoiled in appearance without definite aneurysm. No pneumonic consolidation, CHF nor effusion. No suspicious osseous lesions. 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 IMPRESSION: Low lung volumes with crowding of interstitial lung markings and right basilar atelectasis. No acute cardiopulmonary disease. Electronically Signed   By: Ashley Royalty M.D.   On: 10/07/2016 22:24   US Renal  Result Date: 10/08/2016 CLINICAL DATA:  Acute renal failure. EXAM: RENAL / URINARY TRACT ULTRASOUND COMPLETE COMPARISON:  CT abdomen and pelvis 01/04/2012. FINDINGS: Right Kidney: Length: 11.2 cm. Echogenicity within normal limits. No mass or hydronephrosis visualized. Incidental note is made of sludge and tiny stones within the gallbladder. No evidence of cholecystitis is present on study. Left Kidney: Length: 10.0 cm. Echogenicity within normal limits. No hydronephrosis visualized. Cyst in the midpole measures 3.5 cm in diameter. There may be a single thin septation the cyst. Bladder: Appears normal for degree of bladder distention. IMPRESSION: No acute abnormality.  Negative for hydronephrosis. Incidental note is made of sludge and small stones in the gallbladder. Finding is present on the prior CT. Electronically Signed   By: Inge Rise M.D.   On: 10/08/2016 10:00    Review of Systems  Constitutional: Positive for weight loss. Negative for chills and fever.  Respiratory: Negative  for shortness of breath.   Cardiovascular: Negative for orthopnea and leg swelling.  Gastrointestinal: Positive for nausea and vomiting. Negative for abdominal pain and diarrhea.  Neurological: Positive for weakness.   Blood pressure 136/72, pulse 81, temperature 98.7 F (37.1 C), temperature source Oral, resp. rate 20, weight 88.5 kg (195 lb), SpO2 94 %. Physical Exam  Constitutional: He is oriented to person, place, and time. No distress.  Eyes: No scleral icterus.  Neck: No JVD present.  Cardiovascular: Normal rate and regular rhythm.   No murmur heard. Respiratory: He has no wheezes. He has no rales.  Musculoskeletal: He exhibits no edema.  Neurological: He is alert and oriented to person, place, and time.    Assessment/Plan: Problem #1 acute kidney injury most likely from prerenal syndrome/ACE. Presently his renal function seems to be progressively improving. Patient presently none pliguric Problem #2 hypertension: His blood pressure is reasonably controlled Problem #3 diabetes: no polydipsia or polyria Problem #4 elevated LFTs: Most likely ischemic but other etiologies can not be ruled out. Patient denies drinking any alcohol. Problem #5 hyponatremia. Likely hypovolemic hyponatremia and disorganized improving Problem #6 gastroenteritis physician on essentially be improving Problem #7 history of hypercholesterolemia Plan: Check ANA, complement, hepatitis B surface antigen, hepatitis C antibody 2] We will  Hydrate patient using normal saline at 135 cc per hour 3]We'll check his renal is a morning  Mozella Rexrode S 10/08/2016, 11:36 AM

## 2016-10-08 NOTE — Progress Notes (Signed)
Subjective: This is a 75 years old male with history of multiple was admitted due to generalized weakness and nausea and vomiting. He had difficulty to ambulate. Patient was found to have elevated BUN, Cr. And transaminases. His albumin is low. He is started on iv fluid and symptomatic treatment. He feels better today.  Objective: Vital signs in last 24 hours: Temp:  [98.7 F (37.1 C)-99.1 F (37.3 C)] 98.7 F (37.1 C) (11/17 0100) Pulse Rate:  [53-103] 81 (11/17 0546) Resp:  [15-23] 20 (11/17 0546) BP: (133-157)/(72-81) 136/72 (11/17 0546) SpO2:  [86 %-98 %] 94 % (11/17 0546) Weight:  [88.5 kg (195 lb)] 88.5 kg (195 lb) (11/17 0100) Weight change:     Intake/Output from previous day: 11/16 0701 - 11/17 0700 In: 1953.3 [I.V.:453.3; IV Piggyback:1500] Out: 150 [Urine:150]  PHYSICAL EXAM General appearance: alert and no distress Resp: clear to auscultation bilaterally Cardio: S1, S2 normal GI: soft, non-tender; bowel sounds normal; no masses,  no organomegaly Extremities: extremities normal, atraumatic, no cyanosis or edema  Lab Results:  Results for orders placed or performed during the hospital encounter of 10/07/16 (from the past 48 hour(s))  Comprehensive metabolic panel     Status: Abnormal   Collection Time: 10/07/16  9:53 PM  Result Value Ref Range   Sodium 129 (L) 135 - 145 mmol/L   Potassium 3.8 3.5 - 5.1 mmol/L   Chloride 90 (L) 101 - 111 mmol/L   CO2 26 22 - 32 mmol/L   Glucose, Bld 304 (H) 65 - 99 mg/dL   BUN 84 (H) 6 - 20 mg/dL   Creatinine, Ser 4.03 (H) 0.61 - 1.24 mg/dL   Calcium 8.5 (L) 8.9 - 10.3 mg/dL   Total Protein 8.2 (H) 6.5 - 8.1 g/dL   Albumin 2.8 (L) 3.5 - 5.0 g/dL   AST 393 (H) 15 - 41 U/L   ALT 339 (H) 17 - 63 U/L   Alkaline Phosphatase 174 (H) 38 - 126 U/L   Total Bilirubin 0.5 0.3 - 1.2 mg/dL   GFR calc non Af Amer 13 (L) >60 mL/min   GFR calc Af Amer 15 (L) >60 mL/min    Comment: (NOTE) The eGFR has been calculated using the CKD EPI  equation. This calculation has not been validated in all clinical situations. eGFR's persistently <60 mL/min signify possible Chronic Kidney Disease.    Anion gap 13 5 - 15  Troponin I     Status: None   Collection Time: 10/07/16  9:53 PM  Result Value Ref Range   Troponin I <0.03 <0.03 ng/mL  CBC with Differential     Status: Abnormal   Collection Time: 10/07/16  9:53 PM  Result Value Ref Range   WBC 14.0 (H) 4.0 - 10.5 K/uL   RBC 4.44 4.22 - 5.81 MIL/uL   Hemoglobin 13.3 13.0 - 17.0 g/dL   HCT 39.3 39.0 - 52.0 %   MCV 88.5 78.0 - 100.0 fL   MCH 30.0 26.0 - 34.0 pg   MCHC 33.8 30.0 - 36.0 g/dL   RDW 13.4 11.5 - 15.5 %   Platelets 327 150 - 400 K/uL   Neutrophils Relative % 81 %   Neutro Abs 11.4 (H) 1.7 - 7.7 K/uL   Lymphocytes Relative 11 %   Lymphs Abs 1.5 0.7 - 4.0 K/uL   Monocytes Relative 7 %   Monocytes Absolute 1.0 0.1 - 1.0 K/uL   Eosinophils Relative 1 %   Eosinophils Absolute 0.1 0.0 - 0.7 K/uL  Basophils Relative 0 %   Basophils Absolute 0.0 0.0 - 0.1 K/uL   WBC Morphology WHITE COUNT CONFIRMED ON SMEAR   Lipase, blood     Status: None   Collection Time: 10/07/16  9:53 PM  Result Value Ref Range   Lipase 36 11 - 51 U/L  Glucose, capillary     Status: Abnormal   Collection Time: 10/08/16  1:03 AM  Result Value Ref Range   Glucose-Capillary 283 (H) 65 - 99 mg/dL   Comment 1 Notify RN    Comment 2 Document in Chart   Urinalysis, Routine w reflex microscopic     Status: Abnormal   Collection Time: 10/08/16  2:13 AM  Result Value Ref Range   Color, Urine YELLOW YELLOW   APPearance HAZY (A) CLEAR   Specific Gravity, Urine 1.020 1.005 - 1.030   pH 5.0 5.0 - 8.0   Glucose, UA 250 (A) NEGATIVE mg/dL   Hgb urine dipstick TRACE (A) NEGATIVE   Bilirubin Urine NEGATIVE NEGATIVE   Ketones, ur NEGATIVE NEGATIVE mg/dL   Protein, ur TRACE (A) NEGATIVE mg/dL   Nitrite NEGATIVE NEGATIVE   Leukocytes, UA NEGATIVE NEGATIVE  Urine microscopic-add on     Status:  Abnormal   Collection Time: 10/08/16  2:13 AM  Result Value Ref Range   Squamous Epithelial / LPF 0-5 (A) NONE SEEN   WBC, UA 0-5 0 - 5 WBC/hpf   RBC / HPF 0-5 0 - 5 RBC/hpf   Bacteria, UA MANY (A) NONE SEEN   Casts RED CELL CAST (A) NEGATIVE    Comment: HYALINE CASTS   Urine-Other HEMOSIDIREN   Comprehensive metabolic panel     Status: Abnormal   Collection Time: 10/08/16  6:20 AM  Result Value Ref Range   Sodium 134 (L) 135 - 145 mmol/L   Potassium 3.7 3.5 - 5.1 mmol/L   Chloride 100 (L) 101 - 111 mmol/L   CO2 24 22 - 32 mmol/L   Glucose, Bld 198 (H) 65 - 99 mg/dL   BUN 72 (H) 6 - 20 mg/dL   Creatinine, Ser 3.25 (H) 0.61 - 1.24 mg/dL   Calcium 7.9 (L) 8.9 - 10.3 mg/dL   Total Protein 6.7 6.5 - 8.1 g/dL   Albumin 2.3 (L) 3.5 - 5.0 g/dL   AST 268 (H) 15 - 41 U/L   ALT 271 (H) 17 - 63 U/L   Alkaline Phosphatase 139 (H) 38 - 126 U/L   Total Bilirubin 0.4 0.3 - 1.2 mg/dL   GFR calc non Af Amer 17 (L) >60 mL/min   GFR calc Af Amer 20 (L) >60 mL/min    Comment: (NOTE) The eGFR has been calculated using the CKD EPI equation. This calculation has not been validated in all clinical situations. eGFR's persistently <60 mL/min signify possible Chronic Kidney Disease.    Anion gap 10 5 - 15  CBC     Status: Abnormal   Collection Time: 10/08/16  6:20 AM  Result Value Ref Range   WBC 12.5 (H) 4.0 - 10.5 K/uL   RBC 4.12 (L) 4.22 - 5.81 MIL/uL   Hemoglobin 12.1 (L) 13.0 - 17.0 g/dL   HCT 36.2 (L) 39.0 - 52.0 %   MCV 87.9 78.0 - 100.0 fL   MCH 29.4 26.0 - 34.0 pg   MCHC 33.4 30.0 - 36.0 g/dL   RDW 13.4 11.5 - 15.5 %   Platelets 302 150 - 400 K/uL    ABGS No results for input(s): PHART,  PO2ART, TCO2, HCO3 in the last 72 hours.  Invalid input(s): PCO2 CULTURES No results found for this or any previous visit (from the past 240 hour(s)). Studies/Results: Dg Chest 2 View  Result Date: 10/07/2016 CLINICAL DATA:  Worsening weakness over the past week. History of hypertension and  diabetes. Nonsmoker. EXAM: CHEST  2 VIEW COMPARISON:  01/04/2012 FINDINGS: The lung volumes are low accounting for crowding of interstitial lung markings and right basilar atelectasis. Slight elevation of the right hemidiaphragm relative to left. The aorta is slightly uncoiled in appearance without definite aneurysm. No pneumonic consolidation, CHF nor effusion. No suspicious osseous lesions. 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 IMPRESSION: Low lung volumes with crowding of interstitial lung markings and right basilar atelectasis. No acute cardiopulmonary disease. Electronically Signed   By: Ashley Royalty M.D.   On: 10/07/2016 22:24    Medications: I have reviewed the patient's current medications.  Assesment:  Principal Problem:   Acute renal failure (ARF) (HCC) Active Problems:   Dehydration   Nausea and vomiting   Diabetes mellitus (HCC)   Elevated transaminase level   Hyperlipemia    Plan:  Medications reviewed Will continue Iv hydration Will do nephrology and GI consult Will do hepatitis profile Will monitor CMP.    LOS: 1 day   Akiel Fennell 10/08/2016, 8:09 AM

## 2016-10-08 NOTE — Care Management Note (Signed)
Case Management Note  Patient Details  Name: Stephen KLITZ Sr. MRN: [id number] Date of Birth: [birth year]  Subjective/Objective:    Patient adm from home with acute renal failure. He currently lives with son but plans to go to live with his daughter in [place] at discharge. No DME PTA. Has a PCP, transportation and insurance with drug coverage.                 Action/Plan: Anticipate DC home with self care. No CM needs.    Expected Discharge Date:      10/10/2016           Expected Discharge Plan:  Home/Self Care  In-House Referral:  NA  Discharge planning Services  CM Consult  Post Acute Care Choice:  NA Choice offered to:  NA  DME Arranged:    DME Agency:     HH Arranged:    HH Agency:     Status of Service:  Completed, signed off  If discussed at [employer] of Stay Meetings, dates discussed:    Additional Comments:  Ellen Mayol, Chauncey Reading, RN 10/08/2016, 1:37 PM

## 2016-10-08 NOTE — H&P (Addendum)
History and Physical    Stephen MALON Sr. [id number] DOB: [birth year] DOA: 10/07/2016  Referring MD/NP/PA: Dr. Alvino Chapel PCP: [name], MD  Patient coming from: Home via EMS  Chief Complaint: I was sick  HPI: Stephen Bales Sr. is a 75 y.o. male with medical history significant of HTN and DM type 2; who presents with complaints of feeling sick over the last week. The patient's sister is present at bedside and helps provide additional history. Patient notes that symptoms started with nausea and vomiting anywhere from 2-3 times day. Emesis was usually just of stomach contents and nonbloody. Patient notes initially he was unable to keep anything down. After a few days of this he tried goat milk; which he notes helps Gaus his stomach. Thereafter, patient complained of associated symptoms of generalized weakness. He notes that he was not eating well since he became sick although he tried to keep herself hydrated with fluids. Also notes decreased urine output. Denies any dysuria, focal weakness, chest pain, shortness of breath, fever, chills, loss of consciousness, significant weight change, or confusion. Patient's sister notes that he started talking less(normally is a big talker) and was too weak to move from the couch as a clue that he needed to be brought into the emergency department. Patient had continued to take his home medications including lisinopril and Lasix during this period in time when able to keep food down.   ED Course: Upon admission to the emergency department patient was seen to be afebrile, heart rates 53-103, respirations 15-23, blood pressure and O2 saturations maintained on room air. Lab work revealed WBC 14, sodium 129, chloride 98, BUN 84, creatinine 4.03, glucose 304, complaint phosphatase 174, AST 393, ALT 339, albumin 2.8, total bilirubin 0.5, troponin<0.03. Urinalysis was not yet obtained as the patient had not voided. Patient was noted to feel better after  getting 1.5 L of normal saline while in the ED.   Review of Systems: As per HPI otherwise 10 point review of systems negative.   Past Medical History:  Diagnosis Date  . Diabetes mellitus   . Hypertension     History reviewed. No pertinent surgical history.   reports that he has never smoked. He has never used smokeless tobacco. He reports that he does not drink alcohol or use drugs.  No Known Allergies  Family History  Problem Relation Age of Onset  . Heart attack Other   . Tuberculosis Other   . Tuberculosis Other   . Diabetes type II Father   . Hypertension Father   . Heart attack Maternal Aunt     Prior to Admission medications   Medication Sig Start Date End Date Taking? Authorizing Provider  aspirin EC 81 MG tablet Take 81 mg by mouth every evening.    Yes Historical Provider, MD  cloNIDine (CATAPRES) 0.2 MG tablet Take 0.2 mg by mouth every evening.    Yes Historical Provider, MD  furosemide (LASIX) 20 MG tablet Take 20 mg by mouth every evening.   Yes Historical Provider, MD  glipiZIDE (GLUCOTROL) 10 MG tablet Take 10 mg by mouth every evening.    Yes Historical Provider, MD  hydrALAZINE (APRESOLINE) 25 MG tablet Take 25 mg by mouth every evening.    Yes Historical Provider, MD  lisinopril (PRINIVIL,ZESTRIL) 40 MG tablet Take 20 mg by mouth every evening.    Yes Historical Provider, MD  metFORMIN (GLUCOPHAGE) 1000 MG tablet Take 1,000 mg by mouth every evening.    Yes Historical Provider, MD  simvastatin (ZOCOR) 40 MG tablet Take 40 mg by mouth every evening.    Yes Historical Provider, MD  verapamil (CALAN-SR) 240 MG CR tablet Take 240 mg by mouth at bedtime.   Yes Historical Provider, MD  glucose blood test strip 1 each by Other route 2 (two) times daily. Use as instructed    Historical Provider, MD    Physical Exam:    Constitutional: Elderly male who sick, but is currently in NAD, calm, comfortable Vitals:   10/07/16 2215 10/07/16 2230 10/07/16 2330 10/08/16  0000  BP:   146/79 134/76  Pulse: 101 (!) 53 94 92  Resp: 18 19 23 19   Temp:      TempSrc:      SpO2: 95% (!) 86% 98% 94%   Eyes: PERRL, lids and conjunctivae normal ENMT: Mucous membranes are dry. Posterior pharynx clear of any exudate or lesions. Fair dentition.  Neck: normal, supple, no masses, no thyromegaly Respiratory: clear to auscultation bilaterally, no wheezing, no crackles. Normal respiratory effort. No accessory muscle use.  Cardiovascular: Regular rate and rhythm, no murmurs / rubs / gallops. No extremity edema. 2+ pedal pulses. No carotid bruits.  Abdomen: no tenderness, no masses palpated. No hepatosplenomegaly. Bowel sounds positive.  Musculoskeletal: no clubbing / cyanosis. No joint deformity upper and lower extremities. Good ROM, no contractures. Normal muscle tone.  Skin: no rashes, lesions, ulcers. No induration poor skin turgor Neurologic: CN 2-12 grossly intact. Sensation intact, DTR normal. Strength 5/5 in all 4.  Psychiatric: Normal judgment and insight. Alert and oriented x 3. Normal mood.     Labs on Admission: I have personally reviewed following labs and imaging studies  CBC:  Recent Labs Lab 10/07/16 2153  WBC 14.0*  NEUTROABS 11.4*  HGB 13.3  HCT 39.3  MCV 88.5  PLT [id number]   Basic Metabolic Panel:  Recent Labs Lab 10/07/16 2153  NA 129*  K 3.8  CL 90*  CO2 26  GLUCOSE 304*  BUN 84*  CREATININE 4.03*  CALCIUM 8.5*   GFR: CrCl cannot be calculated (Unknown ideal weight.). Liver Function Tests:  Recent Labs Lab 10/07/16 2153  AST 393*  ALT 339*  ALKPHOS 174*  BILITOT 0.5  PROT 8.2*  ALBUMIN 2.8*    Recent Labs Lab 10/07/16 2153  LIPASE 36   No results for input(s): AMMONIA in the last 168 hours. Coagulation Profile: No results for input(s): INR, PROTIME in the last 168 hours. Cardiac Enzymes:  Recent Labs Lab 10/07/16 2153  TROPONINI <0.03   BNP (last 3 results) No results for input(s): PROBNP in the last 8760  hours. HbA1C: No results for input(s): HGBA1C in the last 72 hours. CBG: No results for input(s): GLUCAP in the last 168 hours. Lipid Profile: No results for input(s): CHOL, HDL, LDLCALC, TRIG, CHOLHDL, LDLDIRECT in the last 72 hours. Thyroid Function Tests: No results for input(s): TSH, T4TOTAL, FREET4, T3FREE, THYROIDAB in the last 72 hours. Anemia Panel: No results for input(s): VITAMINB12, FOLATE, FERRITIN, TIBC, IRON, RETICCTPCT in the last 72 hours. Urine analysis:    Component Value Date/Time   COLORURINE YELLOW 01/04/2012 [place] 01/04/2012 1646   LABSPEC >1.030 (H) 01/04/2012 1646   PHURINE 5.5 01/04/2012 1646   GLUCOSEU 250 (A) 01/04/2012 1646   HGBUR SMALL (A) 01/04/2012 1646   BILIRUBINUR NEGATIVE 01/04/2012 1646   KETONESUR 15 (A) 01/04/2012 1646   PROTEINUR 100 (A) 01/04/2012 1646   UROBILINOGEN 0.2 01/04/2012 1646   NITRITE NEGATIVE 01/04/2012 1646  LEUKOCYTESUR NEGATIVE 01/04/2012 1646   Sepsis Labs: No results found for this or any previous visit (from the past 240 hour(s)).   Radiological Exams on Admission: Dg Chest 2 View  Result Date: 10/07/2016 CLINICAL DATA:  Worsening weakness over the past week. History of hypertension and diabetes. Nonsmoker. EXAM: CHEST  2 VIEW COMPARISON:  01/04/2012 FINDINGS: The lung volumes are low accounting for crowding of interstitial lung markings and right basilar atelectasis. Slight elevation of the right hemidiaphragm relative to left. The aorta is slightly uncoiled in appearance without definite aneurysm. No pneumonic consolidation, CHF nor effusion. No suspicious osseous lesions. 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 IMPRESSION: Low lung volumes with crowding of interstitial lung markings and right basilar atelectasis. No acute cardiopulmonary disease. Electronically Signed   By: Ashley Royalty M.D.   On: 10/07/2016 22:24      Assessment/Plan Acute renal failure 2/2 Dehydration: Patient found to  have elevated BUN of 83 with creatinine of 4.03 - Admit to a telemetry bed - Normal saline IV fluids at 100 ml/hr - Check renal ultrasound - Avoid nephrotoxic agents - Recheck CMP in a.m.  Possible gastroenteritis: Resolved. Suspect possible  viral cause of patient's symptoms. - Advance diet as tolerated  Nausea and vomiting: Resolved. - Zofran prn   Leukocytosis:Acute. WBC elevated at 14. - Follow-up UA  Essential hypertension - Held furosemide and lisinopril - Continue verapamil, clonidine, and hydralazine  Diabetes mellitus type 2 with hyperglycemia: At home patient well controlled with oral medications of glipizide and metformin. However on admission glucose elevated at 304. - Held glipizide and metformin   - Hypoglycemic protocol initiated - CBGs every before meals and at bedtime with sensitive sliding scale insulin for now  Hyperlipidemia - Held simvastatin 2/2 elevated transaminases  Elevated transaminases:  Patient found to have elevated AST, ALT, and alkaline phosphatase on admission. Lipase noted to be within normal limits. Suspect this could be related to his dehydration. - Held simvastatin  - Recheck CMP in a.m. after fluids  Hypoalbuminemia: Albumin 2.8   DVT prophylaxis:  heparin  Code Status: Full Family Communication:  Discussed overall care with the patient and his sister was present at bedside.  Disposition Plan: likely discharge home once medically stable Consults called: None Admission status: Inpatient  Norval Morton MD [facility] Pager [phone]  If 7PM-7AM, please contact night-coverage [email or web address] Password [email or web address]  10/08/2016, 12:42 AM

## 2016-10-09 LAB — ANTISTREPTOLYSIN O TITER

## 2016-10-09 LAB — COMPREHENSIVE METABOLIC PANEL
ALBUMIN: 2.4 g/dL — AB (ref 3.5–5.0)
ALK PHOS: 127 U/L — AB (ref 38–126)
ALT: 210 U/L — ABNORMAL HIGH (ref 17–63)
ANION GAP: 9 (ref 5–15)
AST: 150 U/L — ABNORMAL HIGH (ref 15–41)
BUN: 48 mg/dL — ABNORMAL HIGH (ref 6–20)
CHLORIDE: 103 mmol/L (ref 101–111)
CO2: 24 mmol/L (ref 22–32)
Calcium: 8 mg/dL — ABNORMAL LOW (ref 8.9–10.3)
Creatinine, Ser: 1.94 mg/dL — ABNORMAL HIGH (ref 0.61–1.24)
GFR calc non Af Amer: 32 mL/min — ABNORMAL LOW (ref >60)
GFR, EST AFRICAN AMERICAN: 37 mL/min — AB (ref >60)
GLUCOSE: 168 mg/dL — AB (ref 65–99)
POTASSIUM: 3.9 mmol/L (ref 3.5–5.1)
SODIUM: 136 mmol/L (ref 135–145)
Total Bilirubin: 0.5 mg/dL (ref 0.3–1.2)
Total Protein: 6.8 g/dL (ref 6.5–8.1)

## 2016-10-09 LAB — URINALYSIS, ROUTINE W REFLEX MICROSCOPIC
BILIRUBIN URINE: NEGATIVE
GLUCOSE, UA: 250 mg/dL — AB
HGB URINE DIPSTICK: NEGATIVE
Ketones, ur: NEGATIVE mg/dL
Leukocytes, UA: NEGATIVE
Nitrite: NEGATIVE
PH: 5.5 (ref 5.0–8.0)
Protein, ur: NEGATIVE mg/dL
SPECIFIC GRAVITY, URINE: 1.01 (ref 1.005–1.030)

## 2016-10-09 LAB — GLUCOSE, CAPILLARY
GLUCOSE-CAPILLARY: 263 mg/dL — AB (ref 65–99)
GLUCOSE-CAPILLARY: 222 mg/dL — AB (ref 65–99)
Glucose-Capillary: 162 mg/dL — ABNORMAL HIGH (ref 65–99)
Glucose-Capillary: 210 mg/dL — ABNORMAL HIGH (ref 65–99)

## 2016-10-09 LAB — C3 COMPLEMENT: C3 COMPLEMENT: 215 mg/dL — AB (ref 82–167)

## 2016-10-09 LAB — HEPATITIS B SURFACE ANTIGEN: Hepatitis B Surface Ag: NEGATIVE

## 2016-10-09 LAB — C4 COMPLEMENT: COMPLEMENT C4, BODY FLUID: 40 mg/dL (ref 14–44)

## 2016-10-09 LAB — HEPATITIS B SURFACE ANTIBODY, QUANTITATIVE: Hepatitis B-Post: <3.1 m[IU]/mL — ABNORMAL LOW (ref >9.9)

## 2016-10-09 LAB — HEPATITIS C ANTIBODY: HCV Ab: 0.1 s/co ratio (ref 0.0–0.9)

## 2016-10-09 MED ORDER — ACETAMINOPHEN 325 MG PO TABS
650.0000 mg | ORAL_TABLET | Freq: Four times a day (QID) | ORAL | Status: DC | PRN
  Administered 2016-10-09: 650 mg via ORAL
  Filled 2016-10-09: qty 2

## 2016-10-09 NOTE — Progress Notes (Signed)
Subjective: Patient feels better. He is being hydrated. His renal function is improving. Hepatitis B and C are negative. No new compalint..  Objective: Vital signs in last 24 hours: Temp:  [98.3 F (36.8 C)-99.2 F (37.3 C)] 99.2 F (37.3 C) (11/18 0420) Pulse Rate:  [79-98] 79 (11/18 0420) Resp:  [18] 18 (11/18 0420) BP: (101-156)/(59-90) 131/72 (11/18 0420) SpO2:  [94 %-97 %] 94 % (11/18 0420) Weight:  [87.5 kg (192 lb 12.8 oz)] 87.5 kg (192 lb 12.8 oz) (11/18 0900) Weight change:  Last BM Date: 10/06/16  Intake/Output from previous day: 11/17 0701 - 11/18 0700 In: 3844.3 [P.O.:720; I.V.:3124.3] Out: 2500 [Urine:2500]  PHYSICAL EXAM General appearance: alert and no distress Resp: clear to auscultation bilaterally Cardio: S1, S2 normal GI: soft, non-tender; bowel sounds normal; no masses,  no organomegaly Extremities: extremities normal, atraumatic, no cyanosis or edema  Lab Results:  Results for orders placed or performed during the hospital encounter of 10/07/16 (from the past 48 hour(s))  Comprehensive metabolic panel     Status: Abnormal   Collection Time: 10/07/16  9:53 PM  Result Value Ref Range   Sodium 129 (L) 135 - 145 mmol/L   Potassium 3.8 3.5 - 5.1 mmol/L   Chloride 90 (L) 101 - 111 mmol/L   CO2 26 22 - 32 mmol/L   Glucose, Bld 304 (H) 65 - 99 mg/dL   BUN 84 (H) 6 - 20 mg/dL   Creatinine, Ser 4.03 (H) 0.61 - 1.24 mg/dL   Calcium 8.5 (L) 8.9 - 10.3 mg/dL   Total Protein 8.2 (H) 6.5 - 8.1 g/dL   Albumin 2.8 (L) 3.5 - 5.0 g/dL   AST 393 (H) 15 - 41 U/L   ALT 339 (H) 17 - 63 U/L   Alkaline Phosphatase 174 (H) 38 - 126 U/L   Total Bilirubin 0.5 0.3 - 1.2 mg/dL   GFR calc non Af Amer 13 (L) >60 mL/min   GFR calc Af Amer 15 (L) >60 mL/min    Comment: (NOTE) The eGFR has been calculated using the CKD EPI equation. This calculation has not been validated in all clinical situations. eGFR's persistently <60 mL/min signify possible Chronic Kidney Disease.     Anion gap 13 5 - 15  Troponin I     Status: None   Collection Time: 10/07/16  9:53 PM  Result Value Ref Range   Troponin I <0.03 <0.03 ng/mL  CBC with Differential     Status: Abnormal   Collection Time: 10/07/16  9:53 PM  Result Value Ref Range   WBC 14.0 (H) 4.0 - 10.5 K/uL   RBC 4.44 4.22 - 5.81 MIL/uL   Hemoglobin 13.3 13.0 - 17.0 g/dL   HCT 39.3 39.0 - 52.0 %   MCV 88.5 78.0 - 100.0 fL   MCH 30.0 26.0 - 34.0 pg   MCHC 33.8 30.0 - 36.0 g/dL   RDW 13.4 11.5 - 15.5 %   Platelets 327 150 - 400 K/uL   Neutrophils Relative % 81 %   Neutro Abs 11.4 (H) 1.7 - 7.7 K/uL   Lymphocytes Relative 11 %   Lymphs Abs 1.5 0.7 - 4.0 K/uL   Monocytes Relative 7 %   Monocytes Absolute 1.0 0.1 - 1.0 K/uL   Eosinophils Relative 1 %   Eosinophils Absolute 0.1 0.0 - 0.7 K/uL   Basophils Relative 0 %   Basophils Absolute 0.0 0.0 - 0.1 K/uL   WBC Morphology WHITE COUNT CONFIRMED ON SMEAR   Lipase,  blood     Status: None   Collection Time: 10/07/16  9:53 PM  Result Value Ref Range   Lipase 36 11 - 51 U/L  Glucose, capillary     Status: Abnormal   Collection Time: 10/08/16  1:03 AM  Result Value Ref Range   Glucose-Capillary 283 (H) 65 - 99 mg/dL   Comment 1 Notify RN    Comment 2 Document in Chart   Urinalysis, Routine w reflex microscopic     Status: Abnormal   Collection Time: 10/08/16  2:13 AM  Result Value Ref Range   Color, Urine YELLOW YELLOW   APPearance HAZY (A) CLEAR   Specific Gravity, Urine 1.020 1.005 - 1.030   pH 5.0 5.0 - 8.0   Glucose, UA 250 (A) NEGATIVE mg/dL   Hgb urine dipstick TRACE (A) NEGATIVE   Bilirubin Urine NEGATIVE NEGATIVE   Ketones, ur NEGATIVE NEGATIVE mg/dL   Protein, ur TRACE (A) NEGATIVE mg/dL   Nitrite NEGATIVE NEGATIVE   Leukocytes, UA NEGATIVE NEGATIVE  Urine microscopic-add on     Status: Abnormal   Collection Time: 10/08/16  2:13 AM  Result Value Ref Range   Squamous Epithelial / LPF 0-5 (A) NONE SEEN   WBC, UA 0-5 0 - 5 WBC/hpf   RBC / HPF 0-5  0 - 5 RBC/hpf   Bacteria, UA MANY (A) NONE SEEN   Casts RED CELL CAST (A) NEGATIVE    Comment: HYALINE CASTS   Urine-Other HEMOSIDIREN   Comprehensive metabolic panel     Status: Abnormal   Collection Time: 10/08/16  6:20 AM  Result Value Ref Range   Sodium 134 (L) 135 - 145 mmol/L   Potassium 3.7 3.5 - 5.1 mmol/L   Chloride 100 (L) 101 - 111 mmol/L   CO2 24 22 - 32 mmol/L   Glucose, Bld 198 (H) 65 - 99 mg/dL   BUN 72 (H) 6 - 20 mg/dL   Creatinine, Ser 3.25 (H) 0.61 - 1.24 mg/dL   Calcium 7.9 (L) 8.9 - 10.3 mg/dL   Total Protein 6.7 6.5 - 8.1 g/dL   Albumin 2.3 (L) 3.5 - 5.0 g/dL   AST 268 (H) 15 - 41 U/L   ALT 271 (H) 17 - 63 U/L   Alkaline Phosphatase 139 (H) 38 - 126 U/L   Total Bilirubin 0.4 0.3 - 1.2 mg/dL   GFR calc non Af Amer 17 (L) >60 mL/min   GFR calc Af Amer 20 (L) >60 mL/min    Comment: (NOTE) The eGFR has been calculated using the CKD EPI equation. This calculation has not been validated in all clinical situations. eGFR's persistently <60 mL/min signify possible Chronic Kidney Disease.    Anion gap 10 5 - 15  CBC     Status: Abnormal   Collection Time: 10/08/16  6:20 AM  Result Value Ref Range   WBC 12.5 (H) 4.0 - 10.5 K/uL   RBC 4.12 (L) 4.22 - 5.81 MIL/uL   Hemoglobin 12.1 (L) 13.0 - 17.0 g/dL   HCT 36.2 (L) 39.0 - 52.0 %   MCV 87.9 78.0 - 100.0 fL   MCH 29.4 26.0 - 34.0 pg   MCHC 33.4 30.0 - 36.0 g/dL   RDW 13.4 11.5 - 15.5 %   Platelets 302 150 - 400 K/uL  Hepatitis C antibody     Status: None   Collection Time: 10/08/16  8:31 AM  Result Value Ref Range   HCV Ab 0.1 0.0 - 0.9 s/co ratio  Comment: (NOTE)                                  Negative:     < 0.8                             Indeterminate: 0.8 - 0.9                                  Positive:     > 0.9 The CDC recommends that a positive HCV antibody result be followed up with a HCV Nucleic Acid Amplification test (916384). Performed At: [facility] [place],  [place] [id number] Lindon Romp MD [id number]   Hepatitis B surface antigen     Status: None   Collection Time: 10/08/16  8:31 AM  Result Value Ref Range   Hepatitis B Surface Ag Negative Negative    Comment: (NOTE) Performed At: [facility] [place], [place] [id number] Lindon Romp MD [id number]   Hepatitis B surface antibody     Status: Abnormal   Collection Time: 10/08/16  8:31 AM  Result Value Ref Range   Hepatitis B-Post <3.1 (L) Immunity>9.9 mIU/mL    Comment: (NOTE)  Status of Immunity                     Anti-HBs Level  ------------------                     -------------- Inconsistent with Immunity                   0.0 - 9.9 Consistent with Immunity                          >9.9 Performed At: [facility] [place], [place] [id number] Lindon Romp MD [id number]   Glucose, capillary     Status: Abnormal   Collection Time: 10/08/16  8:39 AM  Result Value Ref Range   Glucose-Capillary 242 (H) 65 - 99 mg/dL  Glucose, capillary     Status: Abnormal   Collection Time: 10/08/16 11:15 AM  Result Value Ref Range   Glucose-Capillary 339 (H) 65 - 99 mg/dL   Comment 1 Notify RN   Antistreptolysin O titer     Status: None   Collection Time: 10/08/16  2:58 PM  Result Value Ref Range   ASO <20.0 0.0 - 200.0 IU/mL    Comment: (NOTE) Performed At: [facility] [address] [place], [place] [id number] Lindon Romp MD [id number]   C3 complement     Status: Abnormal   Collection Time: 10/08/16  2:58 PM  Result Value Ref Range   C3 Complement 215 (H) 82 - 167 mg/dL    Comment: (NOTE) Performed At: [facility] [address] [place], [place] [id number] Lindon Romp MD [id number]   C4 complement     Status: None   Collection Time: 10/08/16  2:58 PM  Result Value Ref Range   Complement C4, Body Fluid 40 14 - 44 mg/dL    Comment: (NOTE) Performed At: [facility] [place], [place] [id number] Lindon Romp MD [id number]   Glucose, capillary  Status: Abnormal   Collection Time: 10/08/16  4:39 PM  Result Value Ref Range   Glucose-Capillary 168 (H) 65 - 99 mg/dL   Comment 1 Notify RN   Glucose, capillary     Status: Abnormal   Collection Time: 10/08/16  9:47 PM  Result Value Ref Range   Glucose-Capillary 181 (H) 65 - 99 mg/dL   Comment 1 Notify RN    Comment 2 Document in Chart   Comprehensive metabolic panel     Status: Abnormal   Collection Time: 10/09/16  6:38 AM  Result Value Ref Range   Sodium 136 135 - 145 mmol/L   Potassium 3.9 3.5 - 5.1 mmol/L   Chloride 103 101 - 111 mmol/L   CO2 24 22 - 32 mmol/L   Glucose, Bld 168 (H) 65 - 99 mg/dL   BUN 48 (H) 6 - 20 mg/dL   Creatinine, Ser 1.94 (H) 0.61 - 1.24 mg/dL    Comment: DELTA CHECK NOTED   Calcium 8.0 (L) 8.9 - 10.3 mg/dL   Total Protein 6.8 6.5 - 8.1 g/dL   Albumin 2.4 (L) 3.5 - 5.0 g/dL   AST 150 (H) 15 - 41 U/L   ALT 210 (H) 17 - 63 U/L   Alkaline Phosphatase 127 (H) 38 - 126 U/L   Total Bilirubin 0.5 0.3 - 1.2 mg/dL   GFR calc non Af Amer 32 (L) >60 mL/min   GFR calc Af Amer 37 (L) >60 mL/min    Comment: (NOTE) The eGFR has been calculated using the CKD EPI equation. This calculation has not been validated in all clinical situations. eGFR's persistently <60 mL/min signify possible Chronic Kidney Disease.    Anion gap 9 5 - 15  Glucose, capillary     Status: Abnormal   Collection Time: 10/09/16  7:57 AM  Result Value Ref Range   Glucose-Capillary 162 (H) 65 - 99 mg/dL   Comment 1 Notify RN   Glucose, capillary     Status: Abnormal   Collection Time: 10/09/16 11:11 AM  Result Value Ref Range   Glucose-Capillary 263 (H) 65 - 99 mg/dL   Comment 1 Notify RN     ABGS No results for input(s): PHART, PO2ART, TCO2, HCO3 in the last 72 hours.  Invalid input(s): PCO2 CULTURES No results found for this or any previous visit (from the past 240  hour(s)). Studies/Results: Dg Chest 2 View  Result Date: 10/07/2016 CLINICAL DATA:  Worsening weakness over the past week. History of hypertension and diabetes. Nonsmoker. EXAM: CHEST  2 VIEW COMPARISON:  01/04/2012 FINDINGS: The lung volumes are low accounting for crowding of interstitial lung markings and right basilar atelectasis. Slight elevation of the right hemidiaphragm relative to left. The aorta is slightly uncoiled in appearance without definite aneurysm. No pneumonic consolidation, CHF nor effusion. No suspicious osseous lesions. 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 IMPRESSION: Low lung volumes with crowding of interstitial lung markings and right basilar atelectasis. No acute cardiopulmonary disease. Electronically Signed   By: Ashley Royalty M.D.   On: 10/07/2016 22:24   US Renal  Result Date: 10/08/2016 CLINICAL DATA:  Acute renal failure. EXAM: RENAL / URINARY TRACT ULTRASOUND COMPLETE COMPARISON:  CT abdomen and pelvis 01/04/2012. FINDINGS: Right Kidney: Length: 11.2 cm. Echogenicity within normal limits. No mass or hydronephrosis visualized. Incidental note is made of sludge and tiny stones within the gallbladder. No evidence of cholecystitis is present on study. Left Kidney: Length: 10.0 cm. Echogenicity within normal limits. No hydronephrosis visualized. Cyst  in the midpole measures 3.5 cm in diameter. There may be a single thin septation the cyst. Bladder: Appears normal for degree of bladder distention. IMPRESSION: No acute abnormality.  Negative for hydronephrosis. Incidental note is made of sludge and small stones in the gallbladder. Finding is present on the prior CT. Electronically Signed   By: Inge Rise M.D.   On: 10/08/2016 10:00    Medications: I have reviewed the patient's current medications.  Assesment:  Principal Problem:   Acute renal failure (ARF) (HCC) Active Problems:   Dehydration   Nausea and vomiting   Diabetes mellitus (HCC)   Elevated  transaminase level   Hyperlipemia   Renal insufficiency    Plan:  Medications reviewed Will continue Iv hydration  nephrology and GI consult appreciated Will monitor CMP.    LOS: 2 days   Starleen Trussell 10/09/2016, 11:43 AM

## 2016-10-09 NOTE — Progress Notes (Signed)
Subjective: Interval History: has no complaint of nausea or vomiting. Patient also denies any diarrhea. Overall he is feeling much better..  Objective: Vital signs in last 24 hours: Temp:  [98.3 F (36.8 C)-99.2 F (37.3 C)] 99.2 F (37.3 C) (11/18 0420) Pulse Rate:  [79-98] 79 (11/18 0420) Resp:  [18] 18 (11/18 0420) BP: (101-156)/(59-90) 131/72 (11/18 0420) SpO2:  [94 %-97 %] 94 % (11/18 0420) Weight change:   Intake/Output from previous day: 11/17 0701 - 11/18 0700 In: 3844.3 [P.O.:720; I.V.:3124.3] Out: 2500 [Urine:2500] Intake/Output this shift: Total I/O In: -  Out: 200 [Urine:200]  General appearance: alert, cooperative and no distress Resp: clear to auscultation bilaterally Cardio: regular rate and rhythm GI: soft, non-tender; bowel sounds normal; no masses,  no organomegaly Extremities: edema He has trace to 1+ pedal edema  Lab Results:  Recent Labs  10/07/16 2153 10/08/16 0620  WBC 14.0* 12.5*  HGB 13.3 12.1*  HCT 39.3 36.2*  PLT 327 302   BMET:  Recent Labs  10/07/16 2153 10/08/16 0620  NA 129* 134*  K 3.8 3.7  CL 90* 100*  CO2 26 24  GLUCOSE 304* 198*  BUN 84* 72*  CREATININE 4.03* 3.25*  CALCIUM 8.5* 7.9*   No results for input(s): PTH in the last 72 hours. Iron Studies: No results for input(s): IRON, TIBC, TRANSFERRIN, FERRITIN in the last 72 hours.  Studies/Results: Dg Chest 2 View  Result Date: 10/07/2016 CLINICAL DATA:  Worsening weakness over the past week. History of hypertension and diabetes. Nonsmoker. EXAM: CHEST  2 VIEW COMPARISON:  01/04/2012 FINDINGS: The lung volumes are low accounting for crowding of interstitial lung markings and right basilar atelectasis. Slight elevation of the right hemidiaphragm relative to left. The aorta is slightly uncoiled in appearance without definite aneurysm. No pneumonic consolidation, CHF nor effusion. No suspicious osseous lesions. 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 IMPRESSION: Low  lung volumes with crowding of interstitial lung markings and right basilar atelectasis. No acute cardiopulmonary disease. Electronically Signed   By: Ashley Royalty M.D.   On: 10/07/2016 22:24   US Renal  Result Date: 10/08/2016 CLINICAL DATA:  Acute renal failure. EXAM: RENAL / URINARY TRACT ULTRASOUND COMPLETE COMPARISON:  CT abdomen and pelvis 01/04/2012. FINDINGS: Right Kidney: Length: 11.2 cm. Echogenicity within normal limits. No mass or hydronephrosis visualized. Incidental note is made of sludge and tiny stones within the gallbladder. No evidence of cholecystitis is present on study. Left Kidney: Length: 10.0 cm. Echogenicity within normal limits. No hydronephrosis visualized. Cyst in the midpole measures 3.5 cm in diameter. There may be a single thin septation the cyst. Bladder: Appears normal for degree of bladder distention. IMPRESSION: No acute abnormality.  Negative for hydronephrosis. Incidental note is made of sludge and small stones in the gallbladder. Finding is present on the prior CT. Electronically Signed   By: Inge Rise M.D.   On: 10/08/2016 10:00    I have reviewed the patient's current medications.  Assessment/Plan: Problem #1 acute kidney injury presently his renal function seems to be improving. Ultrasound of the kidneys didn't show any obstructive uropathy. Patient presently nonoliguric. Problem #2 hypertension: His blood pressure is reasonably controlled Problem #3 elevated LFTs: Most likely ischemic and being followed by GI Problem #4 history of diabetes  Problem #5 history of gastroenteritis at this moment seems to be feeling better possibly viral Problem #6 hyponatremia hypovolemic hyponatremia. Plan: 1] Continue with hydration 2] we'll check his renal panel in the morning.    LOS: 2  days   Nafis Farnan S 10/09/2016,9:41 AM

## 2016-10-09 NOTE — Progress Notes (Signed)
Pt's temperature 101.4 orally.  Dr. Legrand Rams paged and made aware.  New orders for Tylenol PRN, Urinalysis and Blood Cultures X 2.  Will continue to monitor.

## 2016-10-10 LAB — RENAL FUNCTION PANEL
ALBUMIN: 2.2 g/dL — AB (ref 3.5–5.0)
Anion gap: 8 (ref 5–15)
BUN: 27 mg/dL — AB (ref 6–20)
CHLORIDE: 107 mmol/L (ref 101–111)
CO2: 22 mmol/L (ref 22–32)
CREATININE: 1.39 mg/dL — AB (ref 0.61–1.24)
Calcium: 7.8 mg/dL — ABNORMAL LOW (ref 8.9–10.3)
GFR, EST AFRICAN AMERICAN: 55 mL/min — AB (ref >60)
GFR, EST NON AFRICAN AMERICAN: 47 mL/min — AB (ref >60)
Glucose, Bld: 184 mg/dL — ABNORMAL HIGH (ref 65–99)
PHOSPHORUS: 2.1 mg/dL — AB (ref 2.5–4.6)
POTASSIUM: 3.9 mmol/L (ref 3.5–5.1)
Sodium: 137 mmol/L (ref 135–145)

## 2016-10-10 LAB — HEPATIC FUNCTION PANEL
ALBUMIN: 2.3 g/dL — AB (ref 3.5–5.0)
ALT: 154 U/L — ABNORMAL HIGH (ref 17–63)
AST: 85 U/L — AB (ref 15–41)
Alkaline Phosphatase: 145 U/L — ABNORMAL HIGH (ref 38–126)
BILIRUBIN TOTAL: 0.4 mg/dL (ref 0.3–1.2)
Total Protein: 6.5 g/dL (ref 6.5–8.1)

## 2016-10-10 LAB — GLUCOSE, CAPILLARY
GLUCOSE-CAPILLARY: 176 mg/dL — AB (ref 65–99)
GLUCOSE-CAPILLARY: 198 mg/dL — AB (ref 65–99)
GLUCOSE-CAPILLARY: 182 mg/dL — AB (ref 65–99)
Glucose-Capillary: 201 mg/dL — ABNORMAL HIGH (ref 65–99)

## 2016-10-10 NOTE — Progress Notes (Signed)
Subjective: Patient feels better. His renal function is progressively improved. He had an episode of fever yesterday.  Objective: Vital signs in last 24 hours: Temp:  [98.5 F (36.9 C)-101.4 F (38.6 C)] 98.5 F (36.9 C) (11/18 2128) Pulse Rate:  [91-98] 91 (11/18 2128) Resp:  [19] 19 (11/18 2128) BP: (128-154)/(73-82) 128/73 (11/18 2128) SpO2:  [91 %-94 %] 91 % (11/18 2128) Weight:  [89.8 kg (197 lb 14.4 oz)] 89.8 kg (197 lb 14.4 oz) (11/18 2128) Weight change:  Last BM Date: 10/10/16  Intake/Output from previous day: 11/18 0701 - 11/19 0700 In: 4072.5 [P.O.:720; I.V.:3352.5] Out: 1175 [Urine:1175]  PHYSICAL EXAM General appearance: alert and no distress Resp: clear to auscultation bilaterally Cardio: S1, S2 normal GI: soft, non-tender; bowel sounds normal; no masses,  no organomegaly Extremities: extremities normal, atraumatic, no cyanosis or edema  Lab Results:  Results for orders placed or performed during the hospital encounter of 10/07/16 (from the past 48 hour(s))  Antistreptolysin O titer     Status: None   Collection Time: 10/08/16  2:58 PM  Result Value Ref Range   ASO <20.0 0.0 - 200.0 IU/mL    Comment: (NOTE) Performed At: [facility] [place], [place] [id number] Lindon Romp MD ID:[id number]   C3 complement     Status: Abnormal   Collection Time: 10/08/16  2:58 PM  Result Value Ref Range   C3 Complement 215 (H) 82 - 167 mg/dL    Comment: (NOTE) Performed At: [facility] [place], [place] [id number] Lindon Romp MD QP:[id number]   C4 complement     Status: None   Collection Time: 10/08/16  2:58 PM  Result Value Ref Range   Complement C4, Body Fluid 40 14 - 44 mg/dL    Comment: (NOTE) Performed At: [facility] [place], [place] [id number] Lindon Romp MD PJ:[id number]   Glucose, capillary     Status: Abnormal   Collection Time: 10/08/16  4:39 PM  Result Value Ref  Range   Glucose-Capillary 168 (H) 65 - 99 mg/dL   Comment 1 Notify RN   Glucose, capillary     Status: Abnormal   Collection Time: 10/08/16  9:47 PM  Result Value Ref Range   Glucose-Capillary 181 (H) 65 - 99 mg/dL   Comment 1 Notify RN    Comment 2 Document in Chart   Comprehensive metabolic panel     Status: Abnormal   Collection Time: 10/09/16  6:38 AM  Result Value Ref Range   Sodium 136 135 - 145 mmol/L   Potassium 3.9 3.5 - 5.1 mmol/L   Chloride 103 101 - 111 mmol/L   CO2 24 22 - 32 mmol/L   Glucose, Bld 168 (H) 65 - 99 mg/dL   BUN 48 (H) 6 - 20 mg/dL   Creatinine, Ser 1.94 (H) 0.61 - 1.24 mg/dL    Comment: DELTA CHECK NOTED   Calcium 8.0 (L) 8.9 - 10.3 mg/dL   Total Protein 6.8 6.5 - 8.1 g/dL   Albumin 2.4 (L) 3.5 - 5.0 g/dL   AST 150 (H) 15 - 41 U/L   ALT 210 (H) 17 - 63 U/L   Alkaline Phosphatase 127 (H) 38 - 126 U/L   Total Bilirubin 0.5 0.3 - 1.2 mg/dL   GFR calc non Af Amer 32 (L) >60 mL/min   GFR calc Af Amer 37 (L) >60 mL/min    Comment: (NOTE) The eGFR has been calculated using the  CKD EPI equation. This calculation has not been validated in all clinical situations. eGFR's persistently <60 mL/min signify possible Chronic Kidney Disease.    Anion gap 9 5 - 15  Glucose, capillary     Status: Abnormal   Collection Time: 10/09/16  7:57 AM  Result Value Ref Range   Glucose-Capillary 162 (H) 65 - 99 mg/dL   Comment 1 Notify RN   Glucose, capillary     Status: Abnormal   Collection Time: 10/09/16 11:11 AM  Result Value Ref Range   Glucose-Capillary 263 (H) 65 - 99 mg/dL   Comment 1 Notify RN   Urinalysis, Routine w reflex microscopic (not at [facility])     Status: Abnormal   Collection Time: 10/09/16  4:20 PM  Result Value Ref Range   Color, Urine YELLOW YELLOW   APPearance CLEAR CLEAR   Specific Gravity, Urine 1.010 1.005 - 1.030   pH 5.5 5.0 - 8.0   Glucose, UA 250 (A) NEGATIVE mg/dL   Hgb urine dipstick NEGATIVE NEGATIVE   Bilirubin Urine NEGATIVE NEGATIVE    Ketones, ur NEGATIVE NEGATIVE mg/dL   Protein, ur NEGATIVE NEGATIVE mg/dL   Nitrite NEGATIVE NEGATIVE   Leukocytes, UA NEGATIVE NEGATIVE    Comment: MICROSCOPIC NOT DONE ON URINES WITH NEGATIVE PROTEIN, BLOOD, LEUKOCYTES, NITRITE, OR GLUCOSE <1000 mg/dL.  Culture, blood (routine x 2)     Status: None (Preliminary result)   Collection Time: 10/09/16  4:25 PM  Result Value Ref Range   Specimen Description BLOOD BLOOD RIGHT HAND    Special Requests BOTTLES DRAWN AEROBIC AND ANAEROBIC 6CC    Culture NO GROWTH < 24 HOURS    Report Status PENDING   Glucose, capillary     Status: Abnormal   Collection Time: 10/09/16  4:34 PM  Result Value Ref Range   Glucose-Capillary 222 (H) 65 - 99 mg/dL   Comment 1 Notify RN    Comment 2 Document in Chart   Culture, blood (routine x 2)     Status: None (Preliminary result)   Collection Time: 10/09/16  4:36 PM  Result Value Ref Range   Specimen Description BLOOD BLOOD LEFT HAND    Special Requests BOTTLES DRAWN AEROBIC AND ANAEROBIC 6CC    Culture NO GROWTH < 24 HOURS    Report Status PENDING   Glucose, capillary     Status: Abnormal   Collection Time: 10/09/16  8:09 PM  Result Value Ref Range   Glucose-Capillary 210 (H) 65 - 99 mg/dL   Comment 1 Notify RN    Comment 2 Document in Chart   Renal function panel     Status: Abnormal   Collection Time: 10/10/16  6:08 AM  Result Value Ref Range   Sodium 137 135 - 145 mmol/L   Potassium 3.9 3.5 - 5.1 mmol/L   Chloride 107 101 - 111 mmol/L   CO2 22 22 - 32 mmol/L   Glucose, Bld 184 (H) 65 - 99 mg/dL   BUN 27 (H) 6 - 20 mg/dL   Creatinine, Ser 1.39 (H) 0.61 - 1.24 mg/dL   Calcium 7.8 (L) 8.9 - 10.3 mg/dL   Phosphorus 2.1 (L) 2.5 - 4.6 mg/dL   Albumin 2.2 (L) 3.5 - 5.0 g/dL   GFR calc non Af Amer 47 (L) >60 mL/min   GFR calc Af Amer 55 (L) >60 mL/min    Comment: (NOTE) The eGFR has been calculated using the CKD EPI equation. This calculation has not been validated in all clinical  situations. eGFR's persistently <60 mL/min signify possible Chronic Kidney Disease.    Anion gap 8 5 - 15  Hepatic function panel     Status: Abnormal   Collection Time: 10/10/16  6:08 AM  Result Value Ref Range   Total Protein 6.5 6.5 - 8.1 g/dL   Albumin 2.3 (L) 3.5 - 5.0 g/dL   AST 85 (H) 15 - 41 U/L   ALT 154 (H) 17 - 63 U/L   Alkaline Phosphatase 145 (H) 38 - 126 U/L   Total Bilirubin 0.4 0.3 - 1.2 mg/dL   Bilirubin, Direct <0.1 (L) 0.1 - 0.5 mg/dL   Indirect Bilirubin NOT CALCULATED 0.3 - 0.9 mg/dL  Glucose, capillary     Status: Abnormal   Collection Time: 10/10/16  8:46 AM  Result Value Ref Range   Glucose-Capillary 176 (H) 65 - 99 mg/dL   Comment 1 Notify RN     ABGS No results for input(s): PHART, PO2ART, TCO2, HCO3 in the last 72 hours.  Invalid input(s): PCO2 CULTURES Recent Results (from the past 240 hour(s))  Culture, blood (routine x 2)     Status: None (Preliminary result)   Collection Time: 10/09/16  4:25 PM  Result Value Ref Range Status   Specimen Description BLOOD BLOOD RIGHT HAND  Final   Special Requests BOTTLES DRAWN AEROBIC AND ANAEROBIC 6CC  Final   Culture NO GROWTH < 24 HOURS  Final   Report Status PENDING  Incomplete  Culture, blood (routine x 2)     Status: None (Preliminary result)   Collection Time: 10/09/16  4:36 PM  Result Value Ref Range Status   Specimen Description BLOOD BLOOD LEFT HAND  Final   Special Requests BOTTLES DRAWN AEROBIC AND ANAEROBIC 6CC  Final   Culture NO GROWTH < 24 HOURS  Final   Report Status PENDING  Incomplete   Studies/Results: No results found.  Medications: I have reviewed the patient's current medications.  Assesment:  Principal Problem:   Acute renal failure (ARF) (HCC) Active Problems:   Dehydration   Nausea and vomiting   Diabetes mellitus (HCC)   Elevated transaminase level   Hyperlipemia   Renal insufficiency    Plan:  Medications reviewed Will continue Iv hydration Will monitor  CMP.    LOS: 3 days   Stephen Rogers 10/10/2016, 11:24 AM

## 2016-10-10 NOTE — Progress Notes (Signed)
Subjective: Interval History: Patient is no complaints. Patient also denies any diarrhea. Overall he is feeling much better..  Objective: Vital signs in last 24 hours: Temp:  [98.5 F (36.9 C)-101.4 F (38.6 C)] 98.5 F (36.9 C) (11/18 2128) Pulse Rate:  [91-98] 91 (11/18 2128) Resp:  [19] 19 (11/18 2128) BP: (128-154)/(73-82) 128/73 (11/18 2128) SpO2:  [91 %-94 %] 91 % (11/18 2128) Weight:  [87.5 kg (192 lb 12.8 oz)-89.8 kg (197 lb 14.4 oz)] 89.8 kg (197 lb 14.4 oz) (11/18 2128) Weight change:   Intake/Output from previous day: 11/18 0701 - 11/19 0700 In: 4072.5 [P.O.:720; I.V.:3352.5] Out: 1175 [Urine:1175] Intake/Output this shift: Total I/O In: 240 [P.O.:240] Out: 400 [Urine:400]  Generally patient is alert and in no apparent distress Chest is clear to auscultation Heart exam regular rate and rhythm no murmur Extremities trace edema  Lab Results:  Recent Labs  10/07/16 2153 10/08/16 0620  WBC 14.0* 12.5*  HGB 13.3 12.1*  HCT 39.3 36.2*  PLT 327 302   BMET:   Recent Labs  10/09/16 0638 10/10/16 0608  NA 136 137  K 3.9 3.9  CL 103 107  CO2 24 22  GLUCOSE 168* 184*  BUN 48* 27*  CREATININE 1.94* 1.39*  CALCIUM 8.0* 7.8*   No results for input(s): PTH in the last 72 hours. Iron Studies: No results for input(s): IRON, TIBC, TRANSFERRIN, FERRITIN in the last 72 hours.  Studies/Results: No results found.  I have reviewed the patient's current medications.  Assessment/Plan: Problem #1 acute kidney injury presently his renal function Continued to improve. Problem #2 hypertension: His blood pressure is reasonably controlled Problem #3 elevated LFTs: Most likely ischemic and being followed by GI Problem #4 history of diabetes : His random blood sugar is slightly high but stable. Patient doesn't have any polyuria or polydipsia. Problem #5 history of gastroenteritis : Patient has this moment seems to have recovered. Problem #6 hyponatremia hypovolemic  hyponatremia: His sodium has improved. Plan: 1] DC IV fluid. 2] we'll check his renal panel in the morning.    LOS: 3 days   Shaft Corigliano S 10/10/2016,8:53 AM

## 2016-10-11 DIAGNOSIS — Z23 Encounter for immunization: Secondary | ICD-10-CM | POA: Diagnosis not present

## 2016-10-11 LAB — GLUCOSE, CAPILLARY
GLUCOSE-CAPILLARY: 201 mg/dL — AB (ref 65–99)
Glucose-Capillary: 175 mg/dL — ABNORMAL HIGH (ref 65–99)

## 2016-10-11 LAB — RENAL FUNCTION PANEL
ALBUMIN: 2.1 g/dL — AB (ref 3.5–5.0)
ANION GAP: 7 (ref 5–15)
BUN: 19 mg/dL (ref 6–20)
CALCIUM: 8.1 mg/dL — AB (ref 8.9–10.3)
CO2: 26 mmol/L (ref 22–32)
CREATININE: 1.35 mg/dL — AB (ref 0.61–1.24)
Chloride: 104 mmol/L (ref 101–111)
GFR calc non Af Amer: 49 mL/min — ABNORMAL LOW (ref >60)
GFR, EST AFRICAN AMERICAN: 57 mL/min — AB (ref >60)
GLUCOSE: 176 mg/dL — AB (ref 65–99)
PHOSPHORUS: 2.5 mg/dL (ref 2.5–4.6)
Potassium: 4.4 mmol/L (ref 3.5–5.1)
SODIUM: 137 mmol/L (ref 135–145)

## 2016-10-11 NOTE — Care Management Note (Signed)
Case Management Note  Patient Details  Name: Stephen EZZELL Sr. MRN: [id number] Date of Birth: [birth year]     Expected Discharge Date:      10/11/2016           Expected Discharge Plan:  Home/Self Care  In-House Referral:  NA  Discharge planning Services  CM Consult  Post Acute Care Choice:  Home Health Choice offered to:  Patient  DME Arranged:  [facility] D[facility] Agency:  [place] Arranged:  RN, PT [facility] Agency:  [place]  Status of Service:  Completed, signed off  If discussed at [place] of Stay Meetings, dates discussed:    Additional Comments: Patient discharging home today. Had some new difficulties getting OOB and would like [place] PT and RN. Patient familiar with [facility] and would like to use them. Will also need RW. Romualdo Bolk of [facility] will obtain orders from chart and speak with patient prior to discharge. Patient plans to live with daughter in [place] after discharge. Patient aware that [facility] has 48 hours to initiate services.   Rosenda Geffrard, Chauncey [place], RN 10/11/2016, 11:21 AM

## 2016-10-11 NOTE — Care Management Important Message (Signed)
Important Message  Patient Details  Name: Stephen KEPFORD Sr. MRN: [id number] Date of Birth: [birth year]   Medicare Important Message Given:  Yes    Danyal Adorno, Chauncey Reading, RN 10/11/2016, 11:41 AM

## 2016-10-11 NOTE — Progress Notes (Signed)
Subjective: Interval History: Patient is no complaints. Patient denies any nausea or vomiting. His appetite is good  Objective: Vital signs in last 24 hours: Temp:  [98.7 F (37.1 C)-99.7 F (37.6 C)] 98.7 F (37.1 C) (11/20 0611) Pulse Rate:  [96-100] 99 (11/20 0611) Resp:  [20-24] 20 (11/20 0611) BP: (132-153)/(74-77) 132/77 (11/20 0611) SpO2:  [93 %-98 %] 93 % (11/20 [id number]) Weight change:   Intake/Output from previous day: 11/19 0701 - 11/20 0700 In: 720 [P.O.:720] Out: 2100 [Urine:2100] Intake/Output this shift: No intake/output data recorded.  Generally patient is alert and in no apparent distress Chest is clear to auscultation Heart exam regular rate and rhythm no murmur Extremities no  edema  Lab Results: No results for input(s): WBC, HGB, HCT, PLT in the last 72 hours. BMET:   Recent Labs  10/09/16 0638 10/10/16 0608  NA 136 137  K 3.9 3.9  CL 103 107  CO2 24 22  GLUCOSE 168* 184*  BUN 48* 27*  CREATININE 1.94* 1.39*  CALCIUM 8.0* 7.8*   No results for input(s): PTH in the last 72 hours. Iron Studies: No results for input(s): IRON, TIBC, TRANSFERRIN, FERRITIN in the last 72 hours.  Studies/Results: No results found.  I have reviewed the patient's current medications.  Assessment/Plan: Problem #1 acute kidney injury presently his renal function Continued to improve.Blood work is pending from this morning. Patient has this moment doesn't have any uremic signs and symptoms. He remained nonoliguric. Problem #2 hypertension: His blood pressure is reasonably controlled Problem #3 elevated LFTs: Most likely ischemic and being followed by GI. Improving Problem #4 history of diabetes : His random blood sugar is slightly high but stable. Patient doesn't have any polyuria or polydipsia. Problem #5 history of gastroenteritis : Patient has this moment seems to have recovered. Problem #6 hyponatremia hypovolemic hyponatremia: His sodium is normal. Plan: 1] Since  his renal function has recovered I contusion will be admitted and I will sign off. Thank you for letting me participate in his care.   LOS: 4 days   Braelynn Benning S 10/11/2016,8:15 AM

## 2016-10-11 NOTE — Discharge Summary (Signed)
Physician Discharge Summary  Patient ID: Stephen NUCKOLLS Sr. MRN: [id number] DOB/AGE: [birth year] 75 y.o. Primary Care Physician:Sam Wunschel, MD Admit date: 10/07/2016 Discharge date: 10/11/2016    Discharge Diagnoses:   Principal Problem:   Acute renal failure (ARF) ([place]) Active Problems:   Dehydration   Nausea and vomiting   Diabetes mellitus (HCC)   Elevated transaminase level   Hyperlipemia   Renal insufficiency     Medication List    STOP taking these medications   furosemide 20 MG tablet Commonly known as:  LASIX     TAKE these medications   aspirin EC 81 MG tablet Take 81 mg by mouth every evening.   cloNIDine 0.2 MG tablet Commonly known as:  CATAPRES Take 0.2 mg by mouth every evening.   glipiZIDE 10 MG tablet Commonly known as:  GLUCOTROL Take 10 mg by mouth every evening.   glucose blood test strip 1 each by Other route 2 (two) times daily. Use as instructed   hydrALAZINE 25 MG tablet Commonly known as:  APRESOLINE Take 25 mg by mouth every evening.   lisinopril 40 MG tablet Commonly known as:  PRINIVIL,ZESTRIL Take 20 mg by mouth every evening.   metFORMIN 1000 MG tablet Commonly known as:  GLUCOPHAGE Take 1,000 mg by mouth every evening.   simvastatin 40 MG tablet Commonly known as:  ZOCOR Take 40 mg by mouth every evening.   verapamil 240 MG CR tablet Commonly known as:  CALAN-SR Take 240 mg by mouth at bedtime.       Discharged Condition: improved    Consults: nephrology/GI  Significant Diagnostic Studies: Dg Chest 2 View  Result Date: 10/07/2016 CLINICAL DATA:  Worsening weakness over the past week. History of hypertension and diabetes. Nonsmoker. EXAM: CHEST  2 VIEW COMPARISON:  01/04/2012 FINDINGS: The lung volumes are low accounting for crowding of interstitial lung markings and right basilar atelectasis. Slight elevation of the right hemidiaphragm relative to left. The aorta is slightly uncoiled in appearance without  definite aneurysm. No pneumonic consolidation, CHF nor effusion. No suspicious osseous lesions. 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 IMPRESSION: Low lung volumes with crowding of interstitial lung markings and right basilar atelectasis. No acute cardiopulmonary disease. Electronically Signed   By: Ashley Royalty M.D.   On: 10/07/2016 22:24   US Renal  Result Date: 10/08/2016 CLINICAL DATA:  Acute renal failure. EXAM: RENAL / URINARY TRACT ULTRASOUND COMPLETE COMPARISON:  CT abdomen and pelvis 01/04/2012. FINDINGS: Right Kidney: Length: 11.2 cm. Echogenicity within normal limits. No mass or hydronephrosis visualized. Incidental note is made of sludge and tiny stones within the gallbladder. No evidence of cholecystitis is present on study. Left Kidney: Length: 10.0 cm. Echogenicity within normal limits. No hydronephrosis visualized. Cyst in the midpole measures 3.5 cm in diameter. There may be a single thin septation the cyst. Bladder: Appears normal for degree of bladder distention. IMPRESSION: No acute abnormality.  Negative for hydronephrosis. Incidental note is made of sludge and small stones in the gallbladder. Finding is present on the prior CT. Electronically Signed   By: Inge Rise M.D.   On: 10/08/2016 10:00    Lab Results: Basic Metabolic Panel:  Recent Labs  10/09/16 0638 10/10/16 0608  NA 136 137  K 3.9 3.9  CL 103 107  CO2 24 22  GLUCOSE 168* 184*  BUN 48* 27*  CREATININE 1.94* 1.39*  CALCIUM 8.0* 7.8*  PHOS  --  2.1*   Liver Function Tests:  Recent Labs  10/09/16 [phone]  10/10/16 0608  AST 150* 85*  ALT 210* 154*  ALKPHOS 127* 145*  BILITOT 0.5 0.4  PROT 6.8 6.5  ALBUMIN 2.4* 2.3*  2.2*     CBC: No results for input(s): WBC, NEUTROABS, HGB, HCT, MCV, PLT in the last 72 hours.  Recent Results (from the past 240 hour(s))  Culture, blood (routine x 2)     Status: None (Preliminary result)   Collection Time: 10/09/16  4:25 PM  Result Value Ref Range  Status   Specimen Description BLOOD BLOOD RIGHT HAND  Final   Special Requests BOTTLES DRAWN AEROBIC AND ANAEROBIC 6CC  Final   Culture NO GROWTH < 24 HOURS  Final   Report Status PENDING  Incomplete  Culture, blood (routine x 2)     Status: None (Preliminary result)   Collection Time: 10/09/16  4:36 PM  Result Value Ref Range Status   Specimen Description BLOOD BLOOD LEFT HAND  Final   Special Requests BOTTLES DRAWN AEROBIC AND ANAEROBIC 6CC  Final   Culture NO GROWTH < 24 HOURS  Final   Report Status PENDING  Incomplete     Hospital Course:   This is a 75 years old male with history of multiple medical illnesses was admitted due to generalized weakness. He had nausea, vomiting and diarrhea. Patient was found to have acute kidney injury and elevated LFT. Patient was seen by nephrology and GI. He was hydrated. His hepatitis profile was negative. Patient improved and his renal function significantly improved. Patient is being discharged to be followed in out patient.  Discharge Exam: Blood pressure 132/77, pulse 99, temperature 98.7 F (37.1 C), temperature source Oral, resp. rate 20, height 5\' 4"  (1.626 m), weight 89.8 kg (197 lb 14.4 oz), SpO2 93 %.    Disposition:  Home.    Follow-up Information    Karley Pho, MD Follow up in 1 week(s).   Specialty:  Internal Medicine Contact information: [place] [address] [phone]           Signed: [name]   10/11/2016, 8:15 AM

## 2016-10-11 NOTE — Progress Notes (Signed)
Discharge instructions reviewed with patient and his son.  Both verbalized understanding of all instructions Discharged to home with son

## 2016-10-11 NOTE — Progress Notes (Signed)
0400 pt had 3 beats vtach. 0655 Dr Legrand Rams aware new orders placed.

## 2016-10-12 ENCOUNTER — Emergency Department (HOSPITAL_COMMUNITY): Payer: Commercial Managed Care - HMO

## 2016-10-12 ENCOUNTER — Inpatient Hospital Stay (HOSPITAL_COMMUNITY)
Admission: EM | Admit: 2016-10-12 | Discharge: 2016-10-26 | DRG: 872 | Disposition: A | Discharge status: Skilled Nursing Facility | Payer: Commercial Managed Care - HMO | Attending: Family Medicine | Admitting: Family Medicine

## 2016-10-12 DIAGNOSIS — N183 Chronic kidney disease, stage 3 unspecified: Secondary | ICD-10-CM | POA: Diagnosis present

## 2016-10-12 DIAGNOSIS — Z7982 Long term (current) use of aspirin: Secondary | ICD-10-CM | POA: Diagnosis not present

## 2016-10-12 DIAGNOSIS — E8770 Fluid overload, unspecified: Secondary | ICD-10-CM | POA: Diagnosis not present

## 2016-10-12 DIAGNOSIS — R000 Tachycardia, unspecified: Secondary | ICD-10-CM | POA: Diagnosis not present

## 2016-10-12 DIAGNOSIS — E860 Dehydration: Secondary | ICD-10-CM | POA: Diagnosis present

## 2016-10-12 DIAGNOSIS — K828 Other specified diseases of gallbladder: Secondary | ICD-10-CM | POA: Diagnosis not present

## 2016-10-12 DIAGNOSIS — I82402 Acute embolism and thrombosis of unspecified deep veins of left lower extremity: Secondary | ICD-10-CM | POA: Diagnosis present

## 2016-10-12 DIAGNOSIS — X58XXXA Exposure to other specified factors, initial encounter: Secondary | ICD-10-CM | POA: Diagnosis not present

## 2016-10-12 DIAGNOSIS — Z9189 Other specified personal risk factors, not elsewhere classified: Secondary | ICD-10-CM

## 2016-10-12 DIAGNOSIS — Z79899 Other long term (current) drug therapy: Secondary | ICD-10-CM

## 2016-10-12 DIAGNOSIS — N179 Acute kidney failure, unspecified: Secondary | ICD-10-CM | POA: Diagnosis present

## 2016-10-12 DIAGNOSIS — R7989 Other specified abnormal findings of blood chemistry: Secondary | ICD-10-CM

## 2016-10-12 DIAGNOSIS — K810 Acute cholecystitis: Secondary | ICD-10-CM | POA: Diagnosis not present

## 2016-10-12 DIAGNOSIS — D649 Anemia, unspecified: Secondary | ICD-10-CM | POA: Diagnosis present

## 2016-10-12 DIAGNOSIS — E8809 Other disorders of plasma-protein metabolism, not elsewhere classified: Secondary | ICD-10-CM | POA: Diagnosis present

## 2016-10-12 DIAGNOSIS — E1165 Type 2 diabetes mellitus with hyperglycemia: Secondary | ICD-10-CM | POA: Diagnosis present

## 2016-10-12 DIAGNOSIS — R600 Localized edema: Secondary | ICD-10-CM

## 2016-10-12 DIAGNOSIS — R531 Weakness: Secondary | ICD-10-CM | POA: Diagnosis not present

## 2016-10-12 DIAGNOSIS — R269 Unspecified abnormalities of gait and mobility: Secondary | ICD-10-CM | POA: Diagnosis not present

## 2016-10-12 DIAGNOSIS — K8020 Calculus of gallbladder without cholecystitis without obstruction: Secondary | ICD-10-CM

## 2016-10-12 DIAGNOSIS — E871 Hypo-osmolality and hyponatremia: Secondary | ICD-10-CM | POA: Diagnosis present

## 2016-10-12 DIAGNOSIS — R935 Abnormal findings on diagnostic imaging of other abdominal regions, including retroperitoneum: Secondary | ICD-10-CM | POA: Diagnosis not present

## 2016-10-12 DIAGNOSIS — K819 Cholecystitis, unspecified: Secondary | ICD-10-CM | POA: Diagnosis not present

## 2016-10-12 DIAGNOSIS — A4151 Sepsis due to Escherichia coli [E. coli]: Principal | ICD-10-CM | POA: Diagnosis present

## 2016-10-12 DIAGNOSIS — E782 Mixed hyperlipidemia: Secondary | ICD-10-CM | POA: Diagnosis present

## 2016-10-12 DIAGNOSIS — I5030 Unspecified diastolic (congestive) heart failure: Secondary | ICD-10-CM | POA: Diagnosis present

## 2016-10-12 DIAGNOSIS — I10 Essential (primary) hypertension: Secondary | ICD-10-CM | POA: Diagnosis not present

## 2016-10-12 DIAGNOSIS — R945 Abnormal results of liver function studies: Secondary | ICD-10-CM | POA: Diagnosis not present

## 2016-10-12 DIAGNOSIS — R609 Edema, unspecified: Secondary | ICD-10-CM

## 2016-10-12 DIAGNOSIS — R41841 Cognitive communication deficit: Secondary | ICD-10-CM | POA: Diagnosis not present

## 2016-10-12 DIAGNOSIS — K5900 Constipation, unspecified: Secondary | ICD-10-CM | POA: Diagnosis not present

## 2016-10-12 DIAGNOSIS — R7401 Elevation of levels of liver transaminase levels: Secondary | ICD-10-CM | POA: Diagnosis present

## 2016-10-12 DIAGNOSIS — I130 Hypertensive heart and chronic kidney disease with heart failure and stage 1 through stage 4 chronic kidney disease, or unspecified chronic kidney disease: Secondary | ICD-10-CM | POA: Diagnosis present

## 2016-10-12 DIAGNOSIS — R740 Nonspecific elevation of levels of transaminase and lactic acid dehydrogenase [LDH]: Secondary | ICD-10-CM | POA: Diagnosis present

## 2016-10-12 DIAGNOSIS — R011 Cardiac murmur, unspecified: Secondary | ICD-10-CM | POA: Diagnosis not present

## 2016-10-12 DIAGNOSIS — R933 Abnormal findings on diagnostic imaging of other parts of digestive tract: Secondary | ICD-10-CM | POA: Diagnosis not present

## 2016-10-12 DIAGNOSIS — E119 Type 2 diabetes mellitus without complications: Secondary | ICD-10-CM | POA: Diagnosis not present

## 2016-10-12 DIAGNOSIS — S36122A Contusion of gallbladder, initial encounter: Secondary | ICD-10-CM | POA: Diagnosis not present

## 2016-10-12 DIAGNOSIS — K8051 Calculus of bile duct without cholangitis or cholecystitis with obstruction: Secondary | ICD-10-CM

## 2016-10-12 DIAGNOSIS — R0600 Dyspnea, unspecified: Secondary | ICD-10-CM

## 2016-10-12 DIAGNOSIS — K8062 Calculus of gallbladder and bile duct with acute cholecystitis without obstruction: Secondary | ICD-10-CM | POA: Diagnosis present

## 2016-10-12 DIAGNOSIS — I824Y2 Acute embolism and thrombosis of unspecified deep veins of left proximal lower extremity: Secondary | ICD-10-CM | POA: Diagnosis present

## 2016-10-12 DIAGNOSIS — D72829 Elevated white blood cell count, unspecified: Secondary | ICD-10-CM | POA: Diagnosis not present

## 2016-10-12 DIAGNOSIS — E1122 Type 2 diabetes mellitus with diabetic chronic kidney disease: Secondary | ICD-10-CM | POA: Diagnosis present

## 2016-10-12 DIAGNOSIS — K830 Cholangitis: Secondary | ICD-10-CM | POA: Diagnosis not present

## 2016-10-12 DIAGNOSIS — R509 Fever, unspecified: Secondary | ICD-10-CM | POA: Diagnosis not present

## 2016-10-12 DIAGNOSIS — Z7984 Long term (current) use of oral hypoglycemic drugs: Secondary | ICD-10-CM

## 2016-10-12 DIAGNOSIS — M6281 Muscle weakness (generalized): Secondary | ICD-10-CM | POA: Diagnosis not present

## 2016-10-12 DIAGNOSIS — E785 Hyperlipidemia, unspecified: Secondary | ICD-10-CM | POA: Diagnosis present

## 2016-10-12 DIAGNOSIS — R109 Unspecified abdominal pain: Secondary | ICD-10-CM

## 2016-10-12 DIAGNOSIS — J9811 Atelectasis: Secondary | ICD-10-CM | POA: Diagnosis present

## 2016-10-12 DIAGNOSIS — I82492 Acute embolism and thrombosis of other specified deep vein of left lower extremity: Secondary | ICD-10-CM | POA: Diagnosis not present

## 2016-10-12 DIAGNOSIS — R6520 Severe sepsis without septic shock: Secondary | ICD-10-CM | POA: Diagnosis not present

## 2016-10-12 DIAGNOSIS — T85838A Hemorrhage due to other internal prosthetic devices, implants and grafts, initial encounter: Secondary | ICD-10-CM | POA: Diagnosis not present

## 2016-10-12 HISTORY — DX: Chronic kidney disease, stage 3 (moderate): N18.3

## 2016-10-12 HISTORY — DX: Acute embolism and thrombosis of unspecified deep veins of unspecified lower extremity: I82.409

## 2016-10-12 HISTORY — DX: Chronic kidney disease, stage 3 unspecified: N18.30

## 2016-10-12 HISTORY — DX: Localized edema: R60.0

## 2016-10-12 HISTORY — DX: Hyperlipidemia, unspecified: E78.5

## 2016-10-12 LAB — CBC WITH DIFFERENTIAL/PLATELET
Basophils Absolute: 0 10*3/uL (ref 0.0–0.1)
Basophils Relative: 0 %
Eosinophils Absolute: 0.1 10*3/uL (ref 0.0–0.7)
Eosinophils Relative: 1 %
HEMATOCRIT: 35.2 % — AB (ref 39.0–52.0)
HEMOGLOBIN: 11.6 g/dL — AB (ref 13.0–17.0)
LYMPHS ABS: 1.4 10*3/uL (ref 0.7–4.0)
Lymphocytes Relative: 12 %
MCH: 28.8 pg (ref 26.0–34.0)
MCHC: 33 g/dL (ref 30.0–36.0)
MCV: 87.3 fL (ref 78.0–100.0)
MONOS PCT: 7 %
Monocytes Absolute: 0.8 10*3/uL (ref 0.1–1.0)
NEUTROS ABS: 9.6 10*3/uL — AB (ref 1.7–7.7)
NEUTROS PCT: 80 %
Platelets: 370 10*3/uL (ref 150–400)
RBC: 4.03 MIL/uL — ABNORMAL LOW (ref 4.22–5.81)
RDW: 13.7 % (ref 11.5–15.5)
WBC: 11.9 10*3/uL — ABNORMAL HIGH (ref 4.0–10.5)

## 2016-10-12 LAB — URINALYSIS, ROUTINE W REFLEX MICROSCOPIC
Glucose, UA: 250 mg/dL — AB
Hgb urine dipstick: NEGATIVE
KETONES UR: NEGATIVE mg/dL
Leukocytes, UA: NEGATIVE
NITRITE: NEGATIVE
PH: 5.5 (ref 5.0–8.0)
PROTEIN: 30 mg/dL — AB
Specific Gravity, Urine: 1.01 (ref 1.005–1.030)

## 2016-10-12 LAB — TROPONIN I: Troponin I: 0.03 ng/mL (ref <0.03)

## 2016-10-12 LAB — COMPREHENSIVE METABOLIC PANEL
ALBUMIN: 2.1 g/dL — AB (ref 3.5–5.0)
ALT: 631 U/L — AB (ref 17–63)
ANION GAP: 10 (ref 5–15)
AST: 984 U/L — ABNORMAL HIGH (ref 15–41)
Alkaline Phosphatase: 480 U/L — ABNORMAL HIGH (ref 38–126)
BUN: 13 mg/dL (ref 6–20)
CHLORIDE: 98 mmol/L — AB (ref 101–111)
CO2: 24 mmol/L (ref 22–32)
Calcium: 8.3 mg/dL — ABNORMAL LOW (ref 8.9–10.3)
Creatinine, Ser: 1.21 mg/dL (ref 0.61–1.24)
GFR calc non Af Amer: 56 mL/min — ABNORMAL LOW (ref >60)
GLUCOSE: 188 mg/dL — AB (ref 65–99)
Potassium: 4.1 mmol/L (ref 3.5–5.1)
SODIUM: 132 mmol/L — AB (ref 135–145)
Total Bilirubin: 1.7 mg/dL — ABNORMAL HIGH (ref 0.3–1.2)
Total Protein: 7.1 g/dL (ref 6.5–8.1)

## 2016-10-12 LAB — URINE MICROSCOPIC-ADD ON: RBC / HPF: NONE SEEN RBC/hpf (ref 0–5)

## 2016-10-12 LAB — BRAIN NATRIURETIC PEPTIDE: B NATRIURETIC PEPTIDE 5: 62.7 pg/mL (ref 0.0–100.0)

## 2016-10-12 LAB — I-STAT TROPONIN, ED
TROPONIN I, POC: 0 ng/mL (ref 0.00–0.08)
Troponin i, poc: 0.01 ng/mL (ref 0.00–0.08)

## 2016-10-12 MED ORDER — ASPIRIN 81 MG PO CHEW
324.0000 mg | CHEWABLE_TABLET | Freq: Once | ORAL | Status: AC
  Administered 2016-10-12: 324 mg via ORAL
  Filled 2016-10-12: qty 4

## 2016-10-12 MED ORDER — FUROSEMIDE 10 MG/ML IJ SOLN
20.0000 mg | Freq: Once | INTRAMUSCULAR | Status: AC
  Administered 2016-10-12: 20 mg via INTRAVENOUS
  Filled 2016-10-12: qty 2

## 2016-10-12 NOTE — ED Triage Notes (Signed)
Patient comes in with c/o leg swelling. +3 LLE, 3 RLE. Patient was seen at [facility] last week and was told he had renal issues and they d/c his daily lasix. EMS states his Crt was elevated. Patient states after he stopped the lasix, he's leg began to swell. Patient states he cannot walk now. Hx of HTN, DM. fsbs 268. EMS v/s 122/80, HR 112, high 90s on RA.

## 2016-10-12 NOTE — ED Notes (Signed)
EKG given to Dr. Glick. 

## 2016-10-12 NOTE — ED Provider Notes (Signed)
[facility] DEPT Provider Note   CSN: [id number] Arrival date & time: 10/12/16  1513     History   Chief Complaint Chief Complaint  Patient presents with  . Leg Swelling    HPI CAN DETTMAN Sr. is a 75 y.o. male.   Extremity Weakness  This is a new problem. The current episode started more than 2 days ago. The problem occurs constantly. The problem has been gradually worsening. Associated symptoms include shortness of breath (with exertion). Pertinent negatives include no chest pain and no abdominal pain. The symptoms are aggravated by walking. The symptoms are relieved by rest. He has tried nothing for the symptoms. The treatment provided no relief.    Past Medical History:  Diagnosis Date  . Diabetes mellitus   . Hypertension     Patient Active Problem List   Diagnosis Date Noted  . At high risk for fluid overload 10/13/2016  . Acute renal failure (ARF) ([place]) 10/08/2016  . Nausea and vomiting 10/08/2016  . Diabetes mellitus ([place]) 10/08/2016  . Elevated transaminase level 10/08/2016  . Hyperlipemia 10/08/2016  . Renal insufficiency   . Dehydration 10/07/2016    No past surgical history on file.     Home Medications    Prior to Admission medications   Medication Sig Start Date End Date Taking? Authorizing Provider  aspirin EC 81 MG tablet Take 81 mg by mouth every morning.    Yes Historical Provider, MD  cloNIDine (CATAPRES) 0.2 MG tablet Take 0.2 mg by mouth daily.    Yes Historical Provider, MD  furosemide (LASIX) 20 MG tablet Take 10 mg by mouth daily as needed for edema or fluid. 08/14/16  Yes Historical Provider, MD  glipiZIDE (GLUCOTROL) 10 MG tablet Take 10 mg by mouth daily before breakfast.    Yes Historical Provider, MD  hydrALAZINE (APRESOLINE) 25 MG tablet Take 25 mg by mouth every evening.    Yes Historical Provider, MD  lisinopril (PRINIVIL,ZESTRIL) 40 MG tablet Take 20 mg by mouth every evening.    Yes Historical Provider, MD  metFORMIN  (GLUCOPHAGE) 1000 MG tablet Take 1,000 mg by mouth every evening.    Yes Historical Provider, MD  simvastatin (ZOCOR) 40 MG tablet Take 40 mg by mouth every evening.    Yes Historical Provider, MD  verapamil (CALAN-SR) 240 MG CR tablet Take 240 mg by mouth at bedtime.   Yes Historical Provider, MD    Family History Family History  Problem Relation Age of Onset  . Heart attack Other   . Tuberculosis Other   . Tuberculosis Other   . Diabetes type II Father   . Hypertension Father   . Heart attack Maternal Aunt     Social History Social History  Substance Use Topics  . Smoking status: Never Smoker  . Smokeless tobacco: Never Used  . Alcohol use No     Allergies   Patient has no known allergies.   Review of Systems Review of Systems  Constitutional: Negative for chills and fever.  HENT: Negative for ear pain and sore throat.   Eyes: Negative for pain and visual disturbance.  Respiratory: Positive for shortness of breath (with exertion). Negative for cough.   Cardiovascular: Positive for leg swelling. Negative for chest pain and palpitations.  Gastrointestinal: Negative for abdominal pain and vomiting.  Genitourinary: Negative for dysuria and hematuria.  Musculoskeletal: Positive for extremity weakness. Negative for arthralgias and back pain.  Skin: Negative for color change and rash.  Neurological: Negative for seizures  and syncope.  All other systems reviewed and are negative.    Physical Exam Updated Vital Signs BP 145/96   Pulse 101   Temp 98.5 F (36.9 C) (Oral)   Resp 19   Ht 5\' 4"  (1.626 m)   Wt 89.4 kg   SpO2 97%   BMI 33.81 kg/m   Physical Exam  Constitutional: He appears well-developed and well-nourished.  HENT:  Head: Normocephalic and atraumatic.  Eyes: Conjunctivae are normal.  Neck: Neck supple.  Cardiovascular: Normal rate and regular rhythm.   No murmur heard. Pulmonary/Chest: Effort normal. No respiratory distress. He has no wheezes. He  has rales (bibasilar). He exhibits no tenderness.  Abdominal: Soft. He exhibits no distension. There is no tenderness.  Musculoskeletal: He exhibits edema.  Right lower extremity swelling to the tibia approximately 1+ pitting edema. Left lower extremity 1+ pitting edema to the proximal thigh. No tenderness to palpation.  Neurological: He is alert.  Skin: Skin is warm and dry.  Psychiatric: He has a normal mood and affect.  Nursing note and vitals reviewed.    ED Treatments / Results  Labs (all labs ordered are listed, but only abnormal results are displayed) Labs Reviewed  COMPREHENSIVE METABOLIC PANEL - Abnormal; Notable for the following:       Result Value   Sodium 132 (*)    Chloride 98 (*)    Glucose, Bld 188 (*)    Calcium 8.3 (*)    Albumin 2.1 (*)    AST 984 (*)    ALT 631 (*)    Alkaline Phosphatase 480 (*)    Total Bilirubin 1.7 (*)    GFR calc non Af Amer 56 (*)    All other components within normal limits  CBC WITH DIFFERENTIAL/PLATELET - Abnormal; Notable for the following:    WBC 11.9 (*)    RBC 4.03 (*)    Hemoglobin 11.6 (*)    HCT 35.2 (*)    Neutro Abs 9.6 (*)    All other components within normal limits  URINALYSIS, ROUTINE W REFLEX MICROSCOPIC (NOT AT [facility]) - Abnormal; Notable for the following:    Glucose, UA 250 (*)    Bilirubin Urine SMALL (*)    Protein, ur 30 (*)    All other components within normal limits  TROPONIN I - Abnormal; Notable for the following:    Troponin I 0.03 (*)    All other components within normal limits  URINE MICROSCOPIC-ADD ON - Abnormal; Notable for the following:    Squamous Epithelial / LPF 0-5 (*)    Bacteria, UA FEW (*)    Casts GRANULAR CAST (*)    All other components within normal limits  BRAIN NATRIURETIC PEPTIDE  HEPATITIS PANEL, ACUTE  I-STAT TROPOININ, ED  I-STAT TROPOININ, ED  I-STAT TROPOININ, ED    EKG  EKG Interpretation  Date/Time:  Tuesday October 12 2016 18:37:47 EST Ventricular Rate:   83 PR Interval:    QRS Duration: 72 QT Interval:  357 QTC Calculation: 420 R Axis:   -11 Text Interpretation:  Sinus rhythm Left ventricular hypertrophy Inferior infarct, old Baseline wander in lead(s) V6 When compared with ECG of 01/04/2012, low voltage is noted in anterolateral leads Confirmed by [facility]  MD, DAVID ([id number]) on 10/12/2016 6:43:24 PM       Radiology Dg Chest 2 View  Result Date: 10/12/2016 CLINICAL DATA:  Fluid buildup EXAM: CHEST  2 VIEW COMPARISON:  10/07/2016 FINDINGS: There is bilateral mild interstitial thickening. There is no  focal parenchymal opacity. There is no pleural effusion or pneumothorax. The heart and mediastinal contours are unremarkable. The osseous structures are unremarkable. IMPRESSION: Mild interstitial thickening concerning for mild interstitial edema. Electronically Signed   By: Kathreen Devoid   On: 10/12/2016 16:37    Procedures Procedures (including critical care time)  Medications Ordered in ED Medications  aspirin chewable tablet 324 mg (324 mg Oral Given 10/12/16 1704)  furosemide (LASIX) injection 20 mg (20 mg Intravenous Given 10/12/16 1951)     Initial Impression / Assessment and Plan / ED Course  I have reviewed the triage vital signs and the nursing notes.  Pertinent labs & imaging results that were available during my care of the patient were reviewed by me and considered in my medical decision making (see chart for details).  Clinical Course     75 year old male with past medical history of kidney dysfunction recent hospital admission for acute kidney injury in the setting of dehydration after a bout of diarrhea and vomiting. He's noticed that his legs are swelling more and more each day for 7 more difficulty with ambulation. He is short of breath with ambulation. His vital signs are stable he is mildly tachycardic. He is afebrile. On exam he has bilateral lower extremity edema worse in the left lower extremity as described above.  His heart sounds are normal and he has bilateral basilar crackles in his lungs. No JVD is appreciated on exam. Patient has had issues with renal and liver dysfunction. We will test both of those with a CMP also evaluate for acute heart failure exacerbation, as he is having shortness of breath with exertion and fluid overload. EKG troponin and CBC are also ordered. Aspirin is given.  Doses troponins are negative. BMP is unremarkable. Patient is given IV Lasix. Patient is fluid overloaded likely in the setting of poor kidney function. He also has quite elevated liver enzymes. Hepatitis panel was sent. He has had a negative workup for this in the past however it seems to be constant fluctuating. He will be admitted to the hospital because of poor ambulation and need for PT evaluation as well as further workup of his liver dysfunction.  Final Clinical Impressions(s) / ED Diagnoses   Final diagnoses:  Peripheral edema    New Prescriptions New Prescriptions   No medications on file     Dewaine Conger, MD [phone]    Delora Fuel, MD [phone]

## 2016-10-12 NOTE — ED Notes (Signed)
Patient went to xray 

## 2016-10-13 ENCOUNTER — Observation Stay (HOSPITAL_COMMUNITY): Payer: Commercial Managed Care - HMO

## 2016-10-13 DIAGNOSIS — R600 Localized edema: Secondary | ICD-10-CM | POA: Diagnosis not present

## 2016-10-13 DIAGNOSIS — R269 Unspecified abnormalities of gait and mobility: Secondary | ICD-10-CM | POA: Diagnosis not present

## 2016-10-13 DIAGNOSIS — I130 Hypertensive heart and chronic kidney disease with heart failure and stage 1 through stage 4 chronic kidney disease, or unspecified chronic kidney disease: Secondary | ICD-10-CM | POA: Diagnosis present

## 2016-10-13 DIAGNOSIS — K5900 Constipation, unspecified: Secondary | ICD-10-CM | POA: Diagnosis not present

## 2016-10-13 DIAGNOSIS — Z9189 Other specified personal risk factors, not elsewhere classified: Secondary | ICD-10-CM

## 2016-10-13 DIAGNOSIS — N179 Acute kidney failure, unspecified: Secondary | ICD-10-CM | POA: Diagnosis present

## 2016-10-13 DIAGNOSIS — D72829 Elevated white blood cell count, unspecified: Secondary | ICD-10-CM | POA: Diagnosis not present

## 2016-10-13 DIAGNOSIS — N183 Chronic kidney disease, stage 3 unspecified: Secondary | ICD-10-CM | POA: Diagnosis present

## 2016-10-13 DIAGNOSIS — I82402 Acute embolism and thrombosis of unspecified deep veins of left lower extremity: Secondary | ICD-10-CM | POA: Diagnosis present

## 2016-10-13 DIAGNOSIS — X58XXXA Exposure to other specified factors, initial encounter: Secondary | ICD-10-CM | POA: Diagnosis not present

## 2016-10-13 DIAGNOSIS — E785 Hyperlipidemia, unspecified: Secondary | ICD-10-CM

## 2016-10-13 DIAGNOSIS — Z7982 Long term (current) use of aspirin: Secondary | ICD-10-CM | POA: Diagnosis not present

## 2016-10-13 DIAGNOSIS — K810 Acute cholecystitis: Secondary | ICD-10-CM | POA: Diagnosis not present

## 2016-10-13 DIAGNOSIS — J9811 Atelectasis: Secondary | ICD-10-CM | POA: Diagnosis present

## 2016-10-13 DIAGNOSIS — Z79899 Other long term (current) drug therapy: Secondary | ICD-10-CM | POA: Diagnosis not present

## 2016-10-13 DIAGNOSIS — R011 Cardiac murmur, unspecified: Secondary | ICD-10-CM | POA: Diagnosis not present

## 2016-10-13 DIAGNOSIS — E1165 Type 2 diabetes mellitus with hyperglycemia: Secondary | ICD-10-CM | POA: Diagnosis present

## 2016-10-13 DIAGNOSIS — E871 Hypo-osmolality and hyponatremia: Secondary | ICD-10-CM | POA: Diagnosis present

## 2016-10-13 DIAGNOSIS — E8770 Fluid overload, unspecified: Secondary | ICD-10-CM | POA: Diagnosis present

## 2016-10-13 DIAGNOSIS — D649 Anemia, unspecified: Secondary | ICD-10-CM | POA: Diagnosis present

## 2016-10-13 DIAGNOSIS — E8809 Other disorders of plasma-protein metabolism, not elsewhere classified: Secondary | ICD-10-CM | POA: Diagnosis present

## 2016-10-13 DIAGNOSIS — I82492 Acute embolism and thrombosis of other specified deep vein of left lower extremity: Secondary | ICD-10-CM | POA: Diagnosis not present

## 2016-10-13 DIAGNOSIS — R740 Nonspecific elevation of levels of transaminase and lactic acid dehydrogenase [LDH]: Secondary | ICD-10-CM | POA: Diagnosis present

## 2016-10-13 DIAGNOSIS — E1122 Type 2 diabetes mellitus with diabetic chronic kidney disease: Secondary | ICD-10-CM | POA: Diagnosis present

## 2016-10-13 DIAGNOSIS — A4151 Sepsis due to Escherichia coli [E. coli]: Secondary | ICD-10-CM | POA: Diagnosis present

## 2016-10-13 DIAGNOSIS — K830 Cholangitis: Secondary | ICD-10-CM | POA: Diagnosis not present

## 2016-10-13 DIAGNOSIS — R609 Edema, unspecified: Secondary | ICD-10-CM

## 2016-10-13 DIAGNOSIS — K819 Cholecystitis, unspecified: Secondary | ICD-10-CM | POA: Diagnosis not present

## 2016-10-13 DIAGNOSIS — I5030 Unspecified diastolic (congestive) heart failure: Secondary | ICD-10-CM | POA: Diagnosis present

## 2016-10-13 DIAGNOSIS — K828 Other specified diseases of gallbladder: Secondary | ICD-10-CM | POA: Diagnosis not present

## 2016-10-13 DIAGNOSIS — Z7984 Long term (current) use of oral hypoglycemic drugs: Secondary | ICD-10-CM | POA: Diagnosis not present

## 2016-10-13 DIAGNOSIS — I824Y2 Acute embolism and thrombosis of unspecified deep veins of left proximal lower extremity: Secondary | ICD-10-CM | POA: Diagnosis present

## 2016-10-13 DIAGNOSIS — R509 Fever, unspecified: Secondary | ICD-10-CM | POA: Diagnosis not present

## 2016-10-13 DIAGNOSIS — S36122A Contusion of gallbladder, initial encounter: Secondary | ICD-10-CM | POA: Diagnosis not present

## 2016-10-13 DIAGNOSIS — E860 Dehydration: Secondary | ICD-10-CM | POA: Diagnosis present

## 2016-10-13 DIAGNOSIS — R748 Abnormal levels of other serum enzymes: Secondary | ICD-10-CM

## 2016-10-13 DIAGNOSIS — K8062 Calculus of gallbladder and bile duct with acute cholecystitis without obstruction: Secondary | ICD-10-CM | POA: Diagnosis present

## 2016-10-13 LAB — RETICULOCYTES
RBC.: 3.76 MIL/uL — ABNORMAL LOW (ref 4.22–5.81)
RETIC COUNT ABSOLUTE: 33.8 10*3/uL (ref 19.0–186.0)
RETIC CT PCT: 0.9 % (ref 0.4–3.1)

## 2016-10-13 LAB — CBC
HCT: 36.8 % — ABNORMAL LOW (ref 39.0–52.0)
HEMOGLOBIN: 12.4 g/dL — AB (ref 13.0–17.0)
MCH: 29.5 pg (ref 26.0–34.0)
MCHC: 33.7 g/dL (ref 30.0–36.0)
MCV: 87.4 fL (ref 78.0–100.0)
PLATELETS: 391 10*3/uL (ref 150–400)
RBC: 4.21 MIL/uL — AB (ref 4.22–5.81)
RDW: 13.7 % (ref 11.5–15.5)
WBC: 12.9 10*3/uL — ABNORMAL HIGH (ref 4.0–10.5)

## 2016-10-13 LAB — FERRITIN: FERRITIN: 1065 ng/mL — AB (ref 24–336)

## 2016-10-13 LAB — GLUCOSE, CAPILLARY
GLUCOSE-CAPILLARY: 175 mg/dL — AB (ref 65–99)
Glucose-Capillary: 169 mg/dL — ABNORMAL HIGH (ref 65–99)
Glucose-Capillary: 147 mg/dL — ABNORMAL HIGH (ref 65–99)
Glucose-Capillary: 134 mg/dL — ABNORMAL HIGH (ref 65–99)
Glucose-Capillary: 218 mg/dL — ABNORMAL HIGH (ref 65–99)

## 2016-10-13 LAB — HEPATITIS PANEL, ACUTE
HEP B S AG: NEGATIVE
Hep A IgM: NEGATIVE
Hep B C IgM: NEGATIVE

## 2016-10-13 LAB — TROPONIN I
TROPONIN I: 0.06 ng/mL — AB (ref <0.03)
Troponin I: 0.05 ng/mL (ref <0.03)
Troponin I: 0.05 ng/mL (ref <0.03)

## 2016-10-13 LAB — COMPREHENSIVE METABOLIC PANEL
ALK PHOS: 511 U/L — AB (ref 38–126)
ALT: 607 U/L — AB (ref 17–63)
ANION GAP: 11 (ref 5–15)
AST: 748 U/L — ABNORMAL HIGH (ref 15–41)
Albumin: 2.1 g/dL — ABNORMAL LOW (ref 3.5–5.0)
BILIRUBIN TOTAL: 2.8 mg/dL — AB (ref 0.3–1.2)
BUN: 11 mg/dL (ref 6–20)
CALCIUM: 8.8 mg/dL — AB (ref 8.9–10.3)
CO2: 27 mmol/L (ref 22–32)
CREATININE: 1.28 mg/dL — AB (ref 0.61–1.24)
Chloride: 98 mmol/L — ABNORMAL LOW (ref 101–111)
GFR, EST NON AFRICAN AMERICAN: 52 mL/min — AB (ref >60)
Glucose, Bld: 205 mg/dL — ABNORMAL HIGH (ref 65–99)
Potassium: 4.4 mmol/L (ref 3.5–5.1)
SODIUM: 136 mmol/L (ref 135–145)
TOTAL PROTEIN: 6.8 g/dL (ref 6.5–8.1)

## 2016-10-13 LAB — TSH: TSH: 1.095 u[IU]/mL (ref 0.350–4.500)

## 2016-10-13 LAB — HEPARIN LEVEL (UNFRACTIONATED): HEPARIN UNFRACTIONATED: 0.15 [IU]/mL — AB (ref 0.30–0.70)

## 2016-10-13 LAB — IRON AND TIBC
Iron: 35 ug/dL — ABNORMAL LOW (ref 45–182)
Saturation Ratios: 19 % (ref 17.9–39.5)
TIBC: 186 ug/dL — ABNORMAL LOW (ref 250–450)
UIBC: 151 ug/dL

## 2016-10-13 LAB — PROTIME-INR
INR: 1.16
PROTHROMBIN TIME: 14.8 s (ref 11.4–15.2)

## 2016-10-13 LAB — VITAMIN B12: Vitamin B-12: 861 pg/mL (ref 180–914)

## 2016-10-13 LAB — APTT: APTT: 27 s (ref 24–36)

## 2016-10-13 LAB — PREALBUMIN: PREALBUMIN: 9.4 mg/dL — AB (ref 18–38)

## 2016-10-13 LAB — FOLATE: Folate: 15.4 ng/mL (ref >5.9)

## 2016-10-13 MED ORDER — INSULIN ASPART 100 UNIT/ML ~~LOC~~ SOLN
0.0000 [IU] | Freq: Three times a day (TID) | SUBCUTANEOUS | Status: DC
  Administered 2016-10-13 (×2): 1 [IU] via SUBCUTANEOUS
  Administered 2016-10-13 – 2016-10-14 (×3): 2 [IU] via SUBCUTANEOUS
  Administered 2016-10-14: 7 [IU] via SUBCUTANEOUS
  Administered 2016-10-15: 3 [IU] via SUBCUTANEOUS
  Administered 2016-10-15: 1 [IU] via SUBCUTANEOUS
  Administered 2016-10-15: 3 [IU] via SUBCUTANEOUS
  Administered 2016-10-16 – 2016-10-17 (×5): 2 [IU] via SUBCUTANEOUS
  Administered 2016-10-17 – 2016-10-18 (×2): 3 [IU] via SUBCUTANEOUS
  Administered 2016-10-18 – 2016-10-19 (×2): 1 [IU] via SUBCUTANEOUS
  Administered 2016-10-19: 2 [IU] via SUBCUTANEOUS
  Administered 2016-10-19: 1 [IU] via SUBCUTANEOUS
  Administered 2016-10-20: 5 [IU] via SUBCUTANEOUS
  Administered 2016-10-20: 2 [IU] via SUBCUTANEOUS
  Administered 2016-10-20 – 2016-10-21 (×3): 3 [IU] via SUBCUTANEOUS
  Administered 2016-10-21: 2 [IU] via SUBCUTANEOUS
  Administered 2016-10-22: 7 [IU] via SUBCUTANEOUS
  Administered 2016-10-22: 5 [IU] via SUBCUTANEOUS
  Administered 2016-10-22: 7 [IU] via SUBCUTANEOUS
  Administered 2016-10-23 – 2016-10-24 (×4): 2 [IU] via SUBCUTANEOUS
  Administered 2016-10-24: 1 [IU] via SUBCUTANEOUS
  Administered 2016-10-24 – 2016-10-25 (×2): 2 [IU] via SUBCUTANEOUS
  Administered 2016-10-25: 5 [IU] via SUBCUTANEOUS
  Administered 2016-10-25 – 2016-10-26 (×2): 2 [IU] via SUBCUTANEOUS
  Administered 2016-10-26: 3 [IU] via SUBCUTANEOUS

## 2016-10-13 MED ORDER — HYDRALAZINE HCL 25 MG PO TABS
25.0000 mg | ORAL_TABLET | Freq: Every evening | ORAL | Status: DC
  Administered 2016-10-13 – 2016-10-25 (×13): 25 mg via ORAL
  Filled 2016-10-13 (×14): qty 1

## 2016-10-13 MED ORDER — INSULIN ASPART 100 UNIT/ML ~~LOC~~ SOLN
0.0000 [IU] | Freq: Every day | SUBCUTANEOUS | Status: DC
Start: 2016-10-13 — End: 2016-10-26
  Administered 2016-10-13 – 2016-10-18 (×4): 2 [IU] via SUBCUTANEOUS
  Administered 2016-10-20: 3 [IU] via SUBCUTANEOUS
  Administered 2016-10-22: 4 [IU] via SUBCUTANEOUS

## 2016-10-13 MED ORDER — ALBUTEROL SULFATE (2.5 MG/3ML) 0.083% IN NEBU: 2.5000 mg | INHALATION_SOLUTION | RESPIRATORY_TRACT | Status: DC | PRN

## 2016-10-13 MED ORDER — CLONIDINE HCL 0.2 MG PO TABS
0.2000 mg | ORAL_TABLET | Freq: Every day | ORAL | Status: DC
  Administered 2016-10-13 – 2016-10-17 (×5): 0.2 mg via ORAL
  Filled 2016-10-13 (×6): qty 1

## 2016-10-13 MED ORDER — HEPARIN BOLUS VIA INFUSION
4000.0000 [IU] | Freq: Once | INTRAVENOUS | Status: AC
Start: 2016-10-13 — End: 2016-10-13
  Administered 2016-10-13: 4000 [IU] via INTRAVENOUS
  Filled 2016-10-13: qty 4000

## 2016-10-13 MED ORDER — ONDANSETRON HCL 4 MG/2ML IJ SOLN
4.0000 mg | Freq: Four times a day (QID) | INTRAMUSCULAR | Status: DC | PRN
  Administered 2016-10-18: 4 mg via INTRAVENOUS
  Filled 2016-10-13: qty 2

## 2016-10-13 MED ORDER — ONDANSETRON HCL 4 MG PO TABS: 4.0000 mg | ORAL_TABLET | Freq: Four times a day (QID) | ORAL | Status: DC | PRN

## 2016-10-13 MED ORDER — IOPAMIDOL (ISOVUE-300) INJECTION 61%
INTRAVENOUS | Status: AC
  Filled 2016-10-13: qty 30

## 2016-10-13 MED ORDER — ENOXAPARIN SODIUM 40 MG/0.4ML ~~LOC~~ SOLN: 40.0000 mg | SUBCUTANEOUS | Status: DC

## 2016-10-13 MED ORDER — ASPIRIN EC 81 MG PO TBEC
81.0000 mg | DELAYED_RELEASE_TABLET | Freq: Every day | ORAL | Status: DC
  Administered 2016-10-13 – 2016-10-26 (×13): 81 mg via ORAL
  Filled 2016-10-13 (×13): qty 1

## 2016-10-13 MED ORDER — FUROSEMIDE 10 MG/ML IJ SOLN
40.0000 mg | Freq: Two times a day (BID) | INTRAMUSCULAR | Status: DC
  Administered 2016-10-13 – 2016-10-15 (×5): 40 mg via INTRAVENOUS
  Filled 2016-10-13 (×5): qty 4

## 2016-10-13 MED ORDER — VERAPAMIL HCL ER 240 MG PO TBCR
240.0000 mg | EXTENDED_RELEASE_TABLET | Freq: Every day | ORAL | Status: DC
  Administered 2016-10-13 – 2016-10-17 (×4): 240 mg via ORAL
  Filled 2016-10-13 (×6): qty 1

## 2016-10-13 MED ORDER — HEPARIN (PORCINE) IN NACL 100-0.45 UNIT/ML-% IJ SOLN
1500.0000 [IU]/h | INTRAMUSCULAR | Status: DC
  Administered 2016-10-13: 1200 [IU]/h via INTRAVENOUS
  Administered 2016-10-14 – 2016-10-15 (×3): 1500 [IU]/h via INTRAVENOUS
  Filled 2016-10-13 (×3): qty 250

## 2016-10-13 MED ORDER — LISINOPRIL 20 MG PO TABS
20.0000 mg | ORAL_TABLET | Freq: Every evening | ORAL | Status: DC
  Administered 2016-10-13 – 2016-10-20 (×8): 20 mg via ORAL
  Filled 2016-10-13 (×9): qty 1

## 2016-10-13 MED ORDER — HEPARIN BOLUS VIA INFUSION
3000.0000 [IU] | Freq: Once | INTRAVENOUS | Status: AC
  Administered 2016-10-14: 3000 [IU] via INTRAVENOUS
  Filled 2016-10-13: qty 3000

## 2016-10-13 NOTE — Progress Notes (Signed)
[facility]  Patient Status: Active (receiving services up to time of hospitalization)  [facility] is providing the following services: RN and PT  If patient discharges after hours, please call [phone].   Janae Sauce 10/13/2016, 10:25 AM

## 2016-10-13 NOTE — Progress Notes (Signed)
*  PRELIMINARY RESULTS* Vascular Ultrasound Lower extremity venous duplex has been completed.  Preliminary findings: DVT noted in the left peroneal veins and left soleal veins. No DVT RLE. Left baker's cyst noted.   Called results to [place], RN    Landry Mellow, RDMS, RVT  10/13/2016, 11:47 AM

## 2016-10-13 NOTE — Progress Notes (Signed)
MEDICATION RELATED CONSULT NOTE - INITIAL   Pharmacy Consult for medication review Indication: elevated liver enzymes  No Known Allergies  Patient Measurements: Height: 5\' 4"  (162.6 cm) Weight: 197 lb (89.4 kg) IBW/kg (Calculated) : 59.2  Vital Signs: Temp: 98.5 F (36.9 C) (11/21 1520) Temp Source: Oral (11/21 1520) BP: 117/80 (11/22 0200) Pulse Rate: 105 (11/22 0200)  Labs:  Recent Labs  10/10/16 0608 10/11/16 0804 10/12/16 1707  WBC  --   --  11.9*  HGB  --   --  11.6*  HCT  --   --  35.2*  PLT  --   --  370  CREATININE 1.39* 1.35* 1.21  PHOS 2.1* 2.5  --   ALBUMIN 2.3*  2.2* 2.1* 2.1*  PROT 6.5  --  7.1  AST 85*  --  984*  ALT 154*  --  631*  ALKPHOS 145*  --  480*  BILITOT 0.4  --  1.7*  BILIDIR <0.1*  --   --   IBILI NOT CALCULATED  --   --    Estimated Creatinine Clearance: 51.6 mL/min (by C-G formula based on SCr of 1.21 mg/dL).  Medical History: Past Medical History:  Diagnosis Date  . Diabetes mellitus   . Hypertension     Assessment/Plan: 75yo male presents w/ dehydration, found w/ markedly elevated liver enzymes (85/154 on 11/19, now 984/631); pharmacy asked to review meds for potential contribution to liver dysfunction.  Of note pt has had hepatic w/u in the past.  Home meds that may contribute to liver dysfunction: simvastatin (held on admission; elevations >3 times ULN is uncommon), ASA (rare and unlikely w/ low doses), glipizide (rare and unlikely unless recent dose increase), hydralazine (uncommon), lisinopril (rate similar to placebo and transient), metformin (usually only within 8 wks of initiation of therapy), verapamil (usually mild and within 8 wks of initiation).  Overall several of Mr Kofman's medications can contribute to hepatic injury though incidence is rare, especially if medications have been taken for long periods of time; further medical testing required to determine pattern of injury.   Wynona Neat, PharmD, BCPS   10/13/2016,2:11 AM

## 2016-10-13 NOTE — Progress Notes (Signed)
ANTICOAGULATION CONSULT NOTE - Follow Up Consult  Pharmacy Consult for heparin Indication: DVT  Labs:  Recent Labs  10/11/16 0804 10/12/16 1707  10/13/16 0309 10/13/16 0755 10/13/16 1414 10/13/16 2246  HGB  --  11.6*  --  12.4*  --   --   --   HCT  --  35.2*  --  36.8*  --   --   --   PLT  --  370  --  391  --   --   --   APTT  --   --   --   --   --  27  --   LABPROT  --   --   --   --   --  14.8  --   INR  --   --   --   --   --  1.16  --   HEPARINUNFRC  --   --   --   --   --   --  0.15*  CREATININE 1.35* 1.21  --  1.28*  --   --   --   TROPONINI  --   --   < > 0.05* 0.06* 0.05*  --   < > = values in this interval not displayed.   Assessment: 75yo male subtherapeutic on heparin with initial dosing for DVT; RN getting new IV access now.  Goal of Therapy:  Heparin level 0.3-0.7 units/ml   Plan:  Will rebolus with heparin 3000 units and increase gtt by 3 units/kg/hr to 1500 units/hr and check level in [place], PharmD, BCPS  10/13/2016,11:46 PM

## 2016-10-13 NOTE — H&P (Signed)
History and Physical    Stephen WIERZBOWSKI Sr. [id number] DOB: 21-Mar-1941 DOA: 10/12/2016  Referring MD/NP/PA: Dr. Ron Parker , resident PCP: [name], MD  Patient coming from: Home  Chief Complaint: Can't walk  HPI: Stephen Bales Sr. is a 75 y.o. male with medical history significant of HTN and DM type II; who presents with complaints of leg swelling and inability to walk. Patient was just admitted to [facility] from 11/16- 11/20 for acute renal failure secondary to nausea, vomiting, and diarrhea. However, patient notes that he was unable to walk when he went into the hospital and was not able to walk whenever he left the hospital. His regular home medication of 20 mg of Lasix was never restarted during that hospitalization. His kidney function had appeared to have improved from 4 down to 1.35 prior to his discharge. Patient notes that he was given a walker, but has not been able to get up due to progressively worsening swelling of his legs. Swelling goes all the way up to thigh. Patient notes that his legs are too heavy to lift. Symptoms are worsened when trying to walk and notes pain. Associated symptoms include continued nausea. He denies having any vomiting, diarrhea, shortness of breath, chest pain, or palpitations at this time and appetite overall improved. The home health nurse evaluated him today and recommended that he be brought in for further evaluation to the hospital after patient was noted not to be able to get up and stand and the significant swelling of his lower extremities. He was not told to follow-up with anyone regarding the elevated liver enzymes as they appear to have trended down and the acute hepatitis panel checked during his previous admission was negative.  ED Course: Upon admission to [place] patient was seen to be afebrile, pulse 86-104, respirations 17-24, and all other vitals within normal limits. Lab work revealed WBC 11.9, hemoglobin 11.6, sodium  132, potassium 4.1, chloride 98, CO2 24, BUN 13, creatinine 1.21, calcium 8.3, glucose 188, BNP 62.7, and troponin 0.03. Urinalysis was negative for any mild interstitial edema. Patient unable to ambulate despite assistance while in the ED.  Review of Systems: As per HPI otherwise 10 point review of systems negative.   Past Medical History:  Diagnosis Date  . Diabetes mellitus   . Hypertension     No past surgical history on file.   reports that he has never smoked. He has never used smokeless tobacco. He reports that he does not drink alcohol or use drugs.  No Known Allergies  Family History  Problem Relation Age of Onset  . Heart attack Other   . Tuberculosis Other   . Tuberculosis Other   . Diabetes type II Father   . Hypertension Father   . Heart attack Maternal Aunt     Prior to Admission medications   Medication Sig Start Date End Date Taking? Authorizing Provider  aspirin EC 81 MG tablet Take 81 mg by mouth every morning.    Yes Historical Provider, MD  cloNIDine (CATAPRES) 0.2 MG tablet Take 0.2 mg by mouth daily.    Yes Historical Provider, MD  furosemide (LASIX) 20 MG tablet Take 10 mg by mouth daily as needed for edema or fluid. 08/14/16  Yes Historical Provider, MD  glipiZIDE (GLUCOTROL) 10 MG tablet Take 10 mg by mouth daily before breakfast.    Yes Historical Provider, MD  hydrALAZINE (APRESOLINE) 25 MG tablet Take 25 mg by mouth every evening.    Yes  Historical Provider, MD  lisinopril (PRINIVIL,ZESTRIL) 40 MG tablet Take 20 mg by mouth every evening.    Yes Historical Provider, MD  metFORMIN (GLUCOPHAGE) 1000 MG tablet Take 1,000 mg by mouth every evening.    Yes Historical Provider, MD  simvastatin (ZOCOR) 40 MG tablet Take 40 mg by mouth every evening.    Yes Historical Provider, MD  verapamil (CALAN-SR) 240 MG CR tablet Take 240 mg by mouth at bedtime.   Yes Historical Provider, MD    Physical Exam:  Constitutional: Elderly male who appears to be in NAD,  calm, comfortable Vitals:   10/12/16 2300 10/12/16 2315 10/12/16 2330 10/13/16 0015  BP: 137/76 130/83 140/82 145/96  Pulse: 93 88 93 101  Resp: 19 17 20 19   Temp:      TempSrc:      SpO2: 94% 98% 97% 97%  Weight:      Height:       Eyes: PERRL, lids and conjunctivae normal ENMT: Mucous membranes are moist. Posterior pharynx clear of any exudate or lesions.  Neck: normal, supple, no masses, no thyromegaly Respiratory: Patient has fine crackles present throughout both lung fields. No wheezing. Normal respiratory effort. No accessory muscle use.  Cardiovascular: Regular rate and rhythm, + SEM  / rubs / gallops. 2+ pitting bilateral lower extremity edema to the mid thigh. 2+ pedal pulses. No carotid bruits.  Abdomen: no tenderness, no masses palpated. No hepatosplenomegaly. Bowel sounds positive.  Musculoskeletal: no clubbing / cyanosis. No joint deformity upper and lower extremities. Good ROM, no contractures. Normal muscle tone.  Skin: no rashes, lesions, ulcers. No induration Neurologic: CN 2-12 grossly intact. Sensation intact, DTR normal. Strength 4+/5 in all 4.  Psychiatric: Normal judgment and insight. Alert and oriented x 3. Normal mood.     Labs on Admission: I have personally reviewed following labs and imaging studies  CBC:  Recent Labs Lab 10/07/16 2153 10/08/16 0620 10/12/16 1707  WBC 14.0* 12.5* 11.9*  NEUTROABS 11.4*  --  9.6*  HGB 13.3 12.1* 11.6*  HCT 39.3 36.2* 35.2*  MCV 88.5 87.9 87.3  PLT 327 302 0000000   Basic Metabolic Panel:  Recent Labs Lab 10/08/16 0620 10/09/16 0638 10/10/16 0608 10/11/16 0804 10/12/16 1707  NA 134* 136 137 137 132*  K 3.7 3.9 3.9 4.4 4.1  CL 100* 103 107 104 98*  CO2 24 24 22 26 24   GLUCOSE 198* 168* 184* 176* 188*  BUN 72* 48* 27* 19 13  CREATININE 3.25* 1.94* 1.39* 1.35* 1.21  CALCIUM 7.9* 8.0* 7.8* 8.1* 8.3*  PHOS  --   --  2.1* 2.5  --    GFR: Estimated Creatinine Clearance: 51.6 mL/min (by C-G formula based on  SCr of 1.21 mg/dL). Liver Function Tests:  Recent Labs Lab 10/07/16 2153 10/08/16 0620 10/09/16 0638 10/10/16 0608 10/11/16 0804 10/12/16 1707  AST 393* 268* 150* 85*  --  984*  ALT 339* 271* 210* 154*  --  631*  ALKPHOS 174* 139* 127* 145*  --  480*  BILITOT 0.5 0.4 0.5 0.4  --  1.7*  PROT 8.2* 6.7 6.8 6.5  --  7.1  ALBUMIN 2.8* 2.3* 2.4* 2.3*  2.2* 2.1* 2.1*    Recent Labs Lab 10/07/16 2153  LIPASE 36   No results for input(s): AMMONIA in the last 168 hours. Coagulation Profile: No results for input(s): INR, PROTIME in the last 168 hours. Cardiac Enzymes:  Recent Labs Lab 10/07/16 2153 10/12/16 1735  TROPONINI <0.03 0.03*  BNP (last 3 results) No results for input(s): PROBNP in the last 8760 hours. HbA1C: No results for input(s): HGBA1C in the last 72 hours. CBG:  Recent Labs Lab 10/10/16 1210 10/10/16 1615 10/10/16 2141 10/11/16 0727 10/11/16 1116  GLUCAP 198* 182* 201* 175* 201*   Lipid Profile: No results for input(s): CHOL, HDL, LDLCALC, TRIG, CHOLHDL, LDLDIRECT in the last 72 hours. Thyroid Function Tests: No results for input(s): TSH, T4TOTAL, FREET4, T3FREE, THYROIDAB in the last 72 hours. Anemia Panel: No results for input(s): VITAMINB12, FOLATE, FERRITIN, TIBC, IRON, RETICCTPCT in the last 72 hours. Urine analysis:    Component Value Date/Time   COLORURINE YELLOW 10/12/2016 [place] 10/12/2016 1705   LABSPEC 1.010 10/12/2016 1705   PHURINE 5.5 10/12/2016 1705   GLUCOSEU 250 (A) 10/12/2016 1705   HGBUR NEGATIVE 10/12/2016 1705   BILIRUBINUR SMALL (A) 10/12/2016 1705   KETONESUR NEGATIVE 10/12/2016 1705   PROTEINUR 30 (A) 10/12/2016 1705   UROBILINOGEN 0.2 01/04/2012 1646   NITRITE NEGATIVE 10/12/2016 1705   LEUKOCYTESUR NEGATIVE 10/12/2016 1705   Sepsis Labs: Recent Results (from the past 240 hour(s))  Culture, blood (routine x 2)     Status: None (Preliminary result)   Collection Time: 10/09/16  4:25 PM  Result  Value Ref Range Status   Specimen Description BLOOD RIGHT HAND  Final   Special Requests BOTTLES DRAWN AEROBIC AND ANAEROBIC 6CC  Final   Culture NO GROWTH 3 DAYS  Final   Report Status PENDING  Incomplete  Culture, blood (routine x 2)     Status: None (Preliminary result)   Collection Time: 10/09/16  4:36 PM  Result Value Ref Range Status   Specimen Description BLOOD LEFT HAND  Final   Special Requests BOTTLES DRAWN AEROBIC AND ANAEROBIC  Final   Culture NO GROWTH 3 DAYS  Final   Report Status PENDING  Incomplete     Radiological Exams on Admission: Dg Chest 2 View  Result Date: 10/12/2016 CLINICAL DATA:  Fluid buildup EXAM: CHEST  2 VIEW COMPARISON:  10/07/2016 FINDINGS: There is bilateral mild interstitial thickening. There is no focal parenchymal opacity. There is no pleural effusion or pneumothorax. The heart and mediastinal contours are unremarkable. The osseous structures are unremarkable. IMPRESSION: Mild interstitial thickening concerning for mild interstitial edema. Electronically Signed   By: Kathreen Devoid   On: 10/12/2016 16:37    EKG: Independently reviewed. Sinus rhythm with LVH and low voltage in anterior lateral leads  Assessment/Plan Bilateral lower extremity edemaWith gait disturbance: Acute. Patient with recent discharge from the hospital and stoppage of daily Lasix. BNP noted to be 62.7. Patient with inability to walk. Suspect some aspect of lower extremity edema along with deconditioning. - Admit to a telemetry bed - Strict ins and outs - Check TSH - Lasix 40 mg IV every 12 hours; adjust dose as deemed medically appropriate - Check echocardiogram to evaluate overall heart function  - Physical therapy to eval and treat due to inability to walk  Elevated troponin/abnormal EKG. Patient was low voltage noted in the anterior lateral leads acute change from EKGs from 2013 and initial elevated troponin of 0.03. - Trend cardiac enzymes - Follow-up echocardiogram    Leukocytosis: Acute. WBC elevated at 11.6. UA negative and chest x-ray show mild interstitial edema.  - Continue to monitor  Transaminitis:  Patient again found to have elevated AST, ALT, and alkaline phosphatase on admission. Question cause at this time as hepatitis panel was negative. - Held simvastatin  -  Recheck CMP in a.m.  - Pharmacy consult to evaluate possible medication causes  Essential hypertension - Continue verapamil, clonidine, lisinopril, and hydralazine - Would consider placing patient back on scheduled dose of oral Lasix at discharge   Diabetes mellitus type 2 with hyperglycemia: At home patient well controlled with oral medications of glipizide and metformin. On admission glucose elevated at 180. - Held glipizide and metformin   - Hypoglycemic protocol initiated - CBGs every before meals and at bedtime with sensitive sliding scale insulin for now  Chronic kidney disease stage 3: Improved. Creatinine improved from previous admission from 4 down to 1.2. - Continue to monitor  Hyperlipidemia - Held simvastatin 2/2 elevated transaminases  Hypoalbuminemia:  Albumin 2.8 on admission. Question of poor nutrition contributing cause of peripheral edema. - Check prealbumin   DVT prophylaxis: Lovenox  Code Status: Full Family Communication: Discussed overall plan with patient and family present at bedside  Disposition Plan: Likely discharge home once medically stable  Consults called: None  Admission status: Observation telemetry  Norval Morton MD [facility] Pager [phone]  If 7PM-7AM, please contact night-coverage [email or web address] Password [email or web address]  10/13/2016, 1:06 AM

## 2016-10-13 NOTE — Care Management Obs Status (Signed)
[place] NOTIFICATION   Patient Details  Name: Stephen SHEHEE Sr. MRN: [id number] Date of Birth: [birth year]   Medicare Observation Status Notification Given:  Yes    Clancy Mullarkey, Rory Percy, RN 10/13/2016, 2:26 PM

## 2016-10-13 NOTE — Progress Notes (Signed)
Per Dr. [place] Pink ok for patient to have oral contrast for CT Abdomen Pelvis without contrast.

## 2016-10-13 NOTE — ED Notes (Signed)
EDP at bedside  

## 2016-10-13 NOTE — Progress Notes (Signed)
ANTICOAGULATION CONSULT NOTE - Initial Consult  Pharmacy Consult for Heparin Indication: DVT  No Known Allergies  Patient Measurements: Height: 5\' 4"  (162.6 cm) Weight: 199 lb 4.7 oz (90.4 kg) IBW/kg (Calculated) : 59.2 Heparin Dosing Weight: 79 kg  Vital Signs: Temp: 98.6 F (37 C) (11/22 0942) Temp Source: Oral (11/22 0942) BP: 142/97 (11/22 0942) Pulse Rate: 117 (11/22 0942)  Labs:  Recent Labs  10/11/16 0804 10/12/16 1707 10/12/16 1735 10/13/16 0309 10/13/16 0755  HGB  --  11.6*  --  12.4*  --   HCT  --  35.2*  --  36.8*  --   PLT  --  370  --  391  --   CREATININE 1.35* 1.21  --  1.28*  --   TROPONINI  --   --  0.03* 0.05* 0.06*    Estimated Creatinine Clearance: 49 mL/min (by C-G formula based on SCr of 1.28 mg/dL (H)).   Medical History: Past Medical History:  Diagnosis Date  . Diabetes mellitus   . Hypertension     Assessment: 87 YOM recently admitted to [facility] from 11/16-11/20 with AKI secondary to N/V/D who re-presented to [facility] on 11/22 with lower extremity swelling. The patient was also noted to have elevated LFTs. Dopplers confirmed a LLE DVT and pharmacy has been consulted to start heparin for anticoagulation. Hgb 12.4, plts wnl  Goal of Therapy:  Heparin level 0.3-0.7 units/ml Monitor platelets by anticoagulation protocol: Yes   Plan:  1. Heparin bolus of 4000 units x 1 2. Start heparin at 1200 units/hr (12 ml/hr) 3. Will follow-up plans for po anticoagulation once transaminitis resolves 4. Daily heparin levels, CBC 5. Will continue to monitor for any signs/symptoms of bleeding and will follow up with heparin level in 8 hours   Thank you for allowing pharmacy to be a part of this patient's care.  Alycia Rossetti, PharmD, BCPS Clinical Pharmacist Pager: [phone] Clinical phone for 10/13/2016 from 7a-3:30p: [phone] If after 3:30p, please call main pharmacy at: [phone] 10/13/2016 1:46 PM

## 2016-10-13 NOTE — Progress Notes (Signed)
New Admission Note: Pt admitted from [facility] to room 6E01  Arrival Method: via stretcher Mental Orientation: alert and oriented x 4 Telemetry: Placed on box 6E01 Assessment: Completed Skin: Intact, old scarring IV: R hand Pain: Denies Tubes: None Safety Measures: Safety Fall Prevention Plan has been discussed  Admission: To be completed 6 [place] Orientation: Patient has been orientated to the room, unit and staff.  Family: None at bedside at this time, daughter Reginold Agent called to check on pt.  Orders to be reviewed and implemented. Will continue to monitor the patient. Call light has been placed within reach and bed alarm has been activated.   Mady Gemma, BSN, RN-BC Phone: [phone]

## 2016-10-13 NOTE — Progress Notes (Addendum)
[facility] PROGRESS NOTE  Stephen WIGAL Sr. [id number] DOB: 1941-02-06 DOA: 10/12/2016  PCP: [name], MD  Brief History/Interval Summary: 75 year old African-American male with a past medical history of hypertension, diabetes, presented to the hospital with complaints of leg swelling and inability to walk. Patient was recently hospitalized to [facility] from November 16 November 20 for acute renal failure. He was given IV fluids. He went home and noted that his legs were swollen. His Lasix was not resumed. He found it difficult to ambulate so he presented to the hospital here. He was hospitalized for further management.  Reason for Visit: Lower extremity edema and gait abnormalities  Consultants: None  Procedures:   Lower extremity venous Doppler DVT noted in the left peroneal veins and left soleal veins. No DVT RLE. Left baker's cyst noted.   Antibiotics: None  Subjective/Interval History: Patient states that he is feeling slightly better today. Able to move his legs more than yesterday. Swelling appears to be improving. Denies any chest pain, shortness of breath, nausea or vomiting.  ROS: Denies any headaches.  Objective:  Vital Signs  Vitals:   10/13/16 0200 10/13/16 0253 10/13/16 0512 10/13/16 0942  BP: 117/80 137/84 (!) 154/84 (!) 142/97  Pulse: 105 (!) 107 (!) 110 (!) 117  Resp: 19 20 18 20   Temp:  98.8 F (37.1 C) 100 F (37.8 C) 98.6 F (37 C)  TempSrc:  Oral Oral Oral  SpO2: 94% 97% 97% 98%  Weight:  90.4 kg (199 lb 4.7 oz)    Height:        Intake/Output Summary (Last 24 hours) at 10/13/16 1249 Last data filed at 10/13/16 1100  Gross per 24 hour  Intake              460 ml  Output             1720 ml  Net            -1260 ml   Filed Weights   10/12/16 1520 10/13/16 0253  Weight: 89.4 kg (197 lb) 90.4 kg (199 lb 4.7 oz)    General appearance: alert, cooperative, appears stated age and no distress Resp: clear to  auscultation bilaterally Cardio: regular rate and rhythm, S1, S2 normal, no murmur, click, rub or gallop GI: soft, non-tender; bowel sounds normal; no masses,  no organomegaly Extremities: 1-2+ pitting edema bilateral lower extremities Neurologic: Awake and alert. Oriented 3. Cranial nerves II-12 intact. He would move both upper extremities and noted to be normal. Able to move both lower extremities but strength appears to be 3-4/5 bilaterally  Lab Results:  Data Reviewed: I have personally reviewed following labs and imaging studies  CBC:  Recent Labs Lab 10/07/16 2153 10/08/16 0620 10/12/16 1707 10/13/16 0309  WBC 14.0* 12.5* 11.9* 12.9*  NEUTROABS 11.4*  --  9.6*  --   HGB 13.3 12.1* 11.6* 12.4*  HCT 39.3 36.2* 35.2* 36.8*  MCV 88.5 87.9 87.3 87.4  PLT 327 302 370 0000000    Basic Metabolic Panel:  Recent Labs Lab 10/09/16 0638 10/10/16 0608 10/11/16 0804 10/12/16 1707 10/13/16 0309  NA 136 137 137 132* 136  K 3.9 3.9 4.4 4.1 4.4  CL 103 107 104 98* 98*  CO2 24 22 26 24 27   GLUCOSE 168* 184* 176* 188* 205*  BUN 48* 27* 19 13 11   CREATININE 1.94* 1.39* 1.35* 1.21 1.28*  CALCIUM 8.0* 7.8* 8.1* 8.3* 8.8*  PHOS  --  2.1* 2.5  --   --  GFR: Estimated Creatinine Clearance: 49 mL/min (by C-G formula based on SCr of 1.28 mg/dL (H)).  Liver Function Tests:  Recent Labs Lab 10/08/16 0620 10/09/16 0638 10/10/16 0608 10/11/16 0804 10/12/16 1707 10/13/16 0309  AST 268* 150* 85*  --  984* 748*  ALT 271* 210* 154*  --  631* 607*  ALKPHOS 139* 127* 145*  --  480* 511*  BILITOT 0.4 0.5 0.4  --  1.7* 2.8*  PROT 6.7 6.8 6.5  --  7.1 6.8  ALBUMIN 2.3* 2.4* 2.3*  2.2* 2.1* 2.1* 2.1*     Recent Labs Lab 10/07/16 2153  LIPASE 36   Coagulation Profile: No results for input(s): INR, PROTIME in the last 168 hours.  Cardiac Enzymes:  Recent Labs Lab 10/07/16 2153 10/12/16 1735 10/13/16 0309 10/13/16 0755  TROPONINI <0.03 0.03* 0.05* 0.06*     CBG:  Recent Labs Lab 10/11/16 0727 10/11/16 1116 10/13/16 0239 10/13/16 0844 10/13/16 1112  GLUCAP 175* 201* 169* 147* 175*     Thyroid Function Tests:  Recent Labs  10/13/16 0309  TSH 1.095     Recent Results (from the past 240 hour(s))  Culture, blood (routine x 2)     Status: None (Preliminary result)   Collection Time: 10/09/16  4:25 PM  Result Value Ref Range Status   Specimen Description BLOOD RIGHT HAND  Final   Special Requests BOTTLES DRAWN AEROBIC AND ANAEROBIC 6CC  Final   Culture NO GROWTH 4 DAYS  Final   Report Status PENDING  Incomplete  Culture, blood (routine x 2)     Status: None (Preliminary result)   Collection Time: 10/09/16  4:36 PM  Result Value Ref Range Status   Specimen Description BLOOD LEFT HAND  Final   Special Requests BOTTLES DRAWN AEROBIC AND ANAEROBIC  Final   Culture NO GROWTH 4 DAYS  Final   Report Status PENDING  Incomplete      Radiology Studies: Dg Chest 2 View  Result Date: 10/12/2016 CLINICAL DATA:  Fluid buildup EXAM: CHEST  2 VIEW COMPARISON:  10/07/2016 FINDINGS: There is bilateral mild interstitial thickening. There is no focal parenchymal opacity. There is no pleural effusion or pneumothorax. The heart and mediastinal contours are unremarkable. The osseous structures are unremarkable. IMPRESSION: Mild interstitial thickening concerning for mild interstitial edema. Electronically Signed   By: Kathreen Devoid   On: 10/12/2016 16:37     Medications:  Scheduled: . aspirin EC  81 mg Oral Daily  . cloNIDine  0.2 mg Oral Daily  . enoxaparin (LOVENOX) injection  40 mg Subcutaneous Q24H  . furosemide  40 mg Intravenous BID  . hydrALAZINE  25 mg Oral QPM  . insulin aspart  0-5 Units Subcutaneous QHS  . insulin aspart  0-9 Units Subcutaneous TID WC  . iopamidol      . lisinopril  20 mg Oral QPM  . verapamil  240 mg Oral QHS   Continuous:  [id number], ondansetron **OR** ondansetron (ZOFRAN)  IV  Assessment/Plan:  Principal Problem:   Bilateral lower extremity edema Active Problems:   Transaminitis   Hyperlipemia   At high risk for fluid overload   Gait disturbance   Leukocytosis   CKD (chronic kidney disease), stage III    Bilateral lower extremity edemaWith gait disturbance Patient's inability to ambulate, appears to be more so secondary to his edema rather than a neurological process. No neurological abnormalities noted on examination. He does not have any back aches. Denies any falls or injuries. PT and OT  evaluation. Continue diuretics. Lower extremity venous Doppler study as above. TSH is normal. Echocardiogram is pending.  Acute Distal left lower extremity DVT Likely due to recent hospitalization and sedentary status. Initiate IV heparin for now. Will benefit from anticoagulation even though distal veins are involved. More than one vein is involved. No known contraindications. Discussed with patient. He agrees.  Elevated troponin/abnormal EKG Patient with nonspecific changes on EKG. No acute ischemic changes noted. Follow-up an echocardiogram. Patient denies any chest pains.   Transaminitis Patient was noted to have elevated transaminases during his recent hospitalization. Those counts improved. Hepatitis panel is negative. He was seen by GI during that time as well.It was felt that his elevated LFTs were due to an ischemic event. Once again, LFTs are elevated. Could be due to passive congestion. He hasn't had any recent imaging study of his hepatobiliary system. For now proceed with CT scan. Trend LFTs. Check coags. Held simvastatin. ADDENDUM: CT report reviewed. Gallstones and gallbladder wall thickening noted. However, he was nontender in the right quadrant. Cholecystitis seems likely. Patient also noted to have hyperbilirubinemia.Discussed with gastroenterology will evaluate patient. Repeat bloodwork morning.  Essential hypertension Continue home medications.  Monitor blood pressures closely.   Diabetes mellitus type 2 with hyperglycemia At home patient well controlled with oral medications of glipizide and metformin. On admission glucose elevated at 180. Holding his oral agents. SSI. Check HbA1c  Chronic kidney disease stage 3 Improved. Creatinine improved from previous admission from 4 down to 1.2. Continue to monitor  Hyperlipidemia Held simvastatin due to elevated transaminases  Hypoalbuminemia Albumin 2.8 on admission. Question of poor nutrition contributing cause of peripheral edema.   Normocytic anemia. Check anemia panel. No evidence for overt bleeding.  DVT Prophylaxis: Initiate IV heparin    Code Status: Full code  Family Communication: Discussed with the patient  Disposition Plan: Management as outlined above. Await improvement    LOS: 0 days   [facility] Pager [phone] 10/13/2016, 12:49 PM  If 7PM-7AM, please contact night-coverage at [email or web address], password [facility]

## 2016-10-13 NOTE — Evaluation (Signed)
Physical Therapy Evaluation Patient Details Name: Stephen VALLEE Sr. MRN: [id number] DOB: [birth year] Today's Date: 10/13/2016   History of Present Illness   Stephen STEFANELLI Sr. is a 75 y.o. male with medical history significant of HTN and DM type II; who presents with complaints of leg swelling and inability to walk.   Clinical Impression  Pt admitted with/for leg swelling and inability to walk.  Presently at min to min guard assist level..  Pt currently limited functionally due to the problems listed below.  (see problems list.)  Pt will benefit from PT to maximize function and safety to be able to get home safely with available assist of family.     Follow Up Recommendations Home health PT;Supervision for mobility/OOB    Equipment Recommendations       Recommendations for Other Services       Precautions / Restrictions Precautions Precautions: Fall      Mobility  Bed Mobility Overal bed mobility: Needs Assistance Bed Mobility: Supine to Sit     Supine to sit: Supervision        Transfers Overall transfer level: Needs assistance   Transfers: Sit to/from Stand;Stand Pivot Transfers Sit to Stand: Min guard Stand pivot transfers: Min guard       General transfer comment: good technique  Ambulation/Gait Ambulation/Gait assistance: Min assist Ambulation Distance (Feet): 4 Feet (forward and back to recliner) Assistive device: Rolling walker (2 wheeled) Gait Pattern/deviations: Step-to pattern Gait velocity: slow Gait velocity interpretation: Below normal speed for age/gender General Gait Details: antalgic gait, pt not wanting to w/bearing long on the L LE, but generally not assist needed.  Stairs            Wheelchair Mobility    Modified Rankin (Stroke Patients Only)       Balance Overall balance assessment: Needs assistance Sitting-balance support: No upper extremity supported Sitting balance-Leahy Scale: Good     Standing balance support:  Bilateral upper extremity supported Standing balance-Leahy Scale: Poor Standing balance comment: reliant on the RW                             Pertinent Vitals/Pain Pain Assessment: Faces Faces Pain Scale: Hurts little more Pain Location: L LE Pain Descriptors / Indicators: Sore Pain Intervention(s): Monitored during session;Repositioned;Limited activity within patient's tolerance    Home Living Family/patient expects to be discharged to:: Private residence Living Arrangements: Children Available Help at Discharge: Family Type of Home: House Home Access: Stairs to enter   Technical brewer of Steps: several Home Layout: Two level Home Equipment: Environmental consultant - 2 wheels;Shower seat      Prior Function Level of Independence: Independent with assistive device(s);Independent         Comments: lately has had to use a RW     Hand Dominance        Extremity/Trunk Assessment               Lower Extremity Assessment: Generalized weakness         Communication   Communication: No difficulties  Cognition Arousal/Alertness: Awake/alert Behavior During Therapy: WFL for tasks assessed/performed Overall Cognitive Status: Within Functional Limits for tasks assessed                      General Comments      Exercises     Assessment/Plan    PT Assessment Patient needs continued PT services  PT Problem List  Decreased strength;Decreased activity tolerance;Decreased mobility;Decreased range of motion;Decreased balance;Decreased knowledge of use of DME;Pain          PT Treatment Interventions DME instruction;Gait training;Functional mobility training;Therapeutic activities;Balance training;Patient/family education    PT Goals (Current goals can be found in the Care Plan section)  Acute Rehab PT Goals Patient Stated Goal: home to my daughter's home able to walk. PT Goal Formulation: With patient Time For Goal Achievement: 10/20/16 Potential  to Achieve Goals: Good    Frequency Min 3X/week   Barriers to discharge        Co-evaluation               End of Session   Activity Tolerance: Patient limited by pain;Patient tolerated treatment well Patient left: in chair;with call bell/phone within reach;with chair alarm set Nurse Communication: Mobility status    Functional Assessment Tool Used: clinical judgement Functional Limitation: Mobility: Walking and moving around Mobility: Walking and Moving Around Current Status [id number]): At least 1 percent but less than 20 percent impaired, limited or restricted Mobility: Walking and Moving Around Goal Status [phone]): At least 1 percent but less than 20 percent impaired, limited or restricted    Time: 0956-1026 PT Time Calculation (min) (ACUTE ONLY): 30 min   Charges:   PT Evaluation $PT Eval Moderate Complexity: 1 Procedure PT Treatments $Therapeutic Activity: 8-22 mins   PT G Codes:   PT G-Codes **NOT FOR INPATIENT CLASS** Functional Assessment Tool Used: clinical judgement Functional Limitation: Mobility: Walking and moving around Mobility: Walking and Moving Around Current Status [id number]): At least 1 percent but less than 20 percent impaired, limited or restricted Mobility: Walking and Moving Around Goal Status [phone]): At least 1 percent but less than 20 percent impaired, limited or restricted    Burnard Bunting 10/13/2016, 10:39 AM 10/13/2016  Donnella Sham, [place] [phone]  (pager)

## 2016-10-14 ENCOUNTER — Encounter (HOSPITAL_COMMUNITY): Payer: Self-pay | Admitting: *Deleted

## 2016-10-14 ENCOUNTER — Inpatient Hospital Stay (HOSPITAL_COMMUNITY): Payer: Commercial Managed Care - HMO

## 2016-10-14 DIAGNOSIS — I82409 Acute embolism and thrombosis of unspecified deep veins of unspecified lower extremity: Secondary | ICD-10-CM

## 2016-10-14 DIAGNOSIS — R011 Cardiac murmur, unspecified: Secondary | ICD-10-CM

## 2016-10-14 HISTORY — DX: Acute embolism and thrombosis of unspecified deep veins of unspecified lower extremity: I82.409

## 2016-10-14 LAB — CULTURE, BLOOD (ROUTINE X 2)
CULTURE: NO GROWTH
Culture: NO GROWTH

## 2016-10-14 LAB — HEPARIN LEVEL (UNFRACTIONATED)
HEPARIN UNFRACTIONATED: 0.43 [IU]/mL (ref 0.30–0.70)
Heparin Unfractionated: 0.42 IU/mL (ref 0.30–0.70)

## 2016-10-14 LAB — CBC
HCT: 33 % — ABNORMAL LOW (ref 39.0–52.0)
HEMOGLOBIN: 10.8 g/dL — AB (ref 13.0–17.0)
MCH: 28.5 pg (ref 26.0–34.0)
MCHC: 32.7 g/dL (ref 30.0–36.0)
MCV: 87.1 fL (ref 78.0–100.0)
Platelets: 402 10*3/uL — ABNORMAL HIGH (ref 150–400)
RBC: 3.79 MIL/uL — ABNORMAL LOW (ref 4.22–5.81)
RDW: 13.8 % (ref 11.5–15.5)
WBC: 9.2 10*3/uL (ref 4.0–10.5)

## 2016-10-14 LAB — COMPREHENSIVE METABOLIC PANEL
ALBUMIN: 1.9 g/dL — AB (ref 3.5–5.0)
ALK PHOS: 466 U/L — AB (ref 38–126)
ALT: 365 U/L — ABNORMAL HIGH (ref 17–63)
ANION GAP: 9 (ref 5–15)
AST: 285 U/L — AB (ref 15–41)
BUN: 12 mg/dL (ref 6–20)
CALCIUM: 8.3 mg/dL — AB (ref 8.9–10.3)
CO2: 27 mmol/L (ref 22–32)
Chloride: 100 mmol/L — ABNORMAL LOW (ref 101–111)
Creatinine, Ser: 1.32 mg/dL — ABNORMAL HIGH (ref 0.61–1.24)
GFR calc Af Amer: 58 mL/min — ABNORMAL LOW (ref >60)
GFR calc non Af Amer: 50 mL/min — ABNORMAL LOW (ref >60)
GLUCOSE: 164 mg/dL — AB (ref 65–99)
POTASSIUM: 3.9 mmol/L (ref 3.5–5.1)
SODIUM: 136 mmol/L (ref 135–145)
Total Bilirubin: 1.4 mg/dL — ABNORMAL HIGH (ref 0.3–1.2)
Total Protein: 6.3 g/dL — ABNORMAL LOW (ref 6.5–8.1)

## 2016-10-14 LAB — ECHOCARDIOGRAM COMPLETE
HEIGHTINCHES: 64 in
Weight: 3195.79 oz

## 2016-10-14 LAB — GLUCOSE, CAPILLARY
GLUCOSE-CAPILLARY: 165 mg/dL — AB (ref 65–99)
Glucose-Capillary: 185 mg/dL — ABNORMAL HIGH (ref 65–99)
Glucose-Capillary: 302 mg/dL — ABNORMAL HIGH (ref 65–99)
Glucose-Capillary: 215 mg/dL — ABNORMAL HIGH (ref 65–99)

## 2016-10-14 NOTE — Progress Notes (Signed)
ANTICOAGULATION CONSULT NOTE   Pharmacy Consult for Heparin Indication: DVT  No Known Allergies  Patient Measurements: Height: 5\' 4"  (162.6 cm) Weight: 199 lb 11.8 oz (90.6 kg) IBW/kg (Calculated) : 59.2 Heparin Dosing Weight: 79 kg  Vital Signs: Temp: 98.8 F (37.1 C) (11/23 0849) Temp Source: Oral (11/23 0849) BP: 110/62 (11/23 0849) Pulse Rate: 89 (11/23 0849)  Labs:  Recent Labs  10/12/16 1707  10/13/16 0309 10/13/16 0755 10/13/16 1414 10/13/16 2246 10/14/16 0822 10/14/16 0829  HGB 11.6*  --  12.4*  --   --   --   --  10.8*  HCT 35.2*  --  36.8*  --   --   --   --  33.0*  PLT 370  --  391  --   --   --   --  402*  APTT  --   --   --   --  27  --   --   --   LABPROT  --   --   --   --  14.8  --   --   --   INR  --   --   --   --  1.16  --   --   --   HEPARINUNFRC  --   --   --   --   --  0.15* 0.43  --   CREATININE 1.21  --  1.28*  --   --   --   --  1.32*  TROPONINI  --   < > 0.05* 0.06* 0.05*  --   --   --   < > = values in this interval not displayed.  Estimated Creatinine Clearance: 47.6 mL/min (by C-G formula based on SCr of 1.32 mg/dL (H)).   Medical History: Past Medical History:  Diagnosis Date  . Diabetes mellitus   . Hypertension    . heparin 1,500 Units/hr (10/14/16 0206)     Assessment: 21 YOM recently admitted to [facility] from 11/16-11/20 with AKI secondary to N/V/D who re-presented to [facility] on 11/22 with lower extremity swelling. The patient was also noted to have elevated LFTs. Dopplers confirmed a LLE DVT and pharmacy has been consulted to start heparin for anticoagulation.   Heparin level currently at goal.  No bleeding or complications noted.  Goal of Therapy:  Heparin level 0.3-0.7 units/ml Monitor platelets by anticoagulation protocol: Yes   Plan:  1. Continue IV heparin at 1500 units/hr. 2. Confirm heparin level remains therapeutic in 6 hrs. 3. Will follow-up plans for po anticoagulation once transaminitis resolves 4. Daily heparin  levels, CBC  Thank you for allowing pharmacy to be a part of this patient's care.  Uvaldo Rising, BCPS  Clinical Pharmacist Pager [phone]  10/14/2016 10:07 AM

## 2016-10-14 NOTE — Consult Note (Signed)
[facility] Gastroenterology Consultation Note  Referring Provider: Dr. Bonnielee Haff [facility]) Primary Care Physician:  [name], MD  Reason for Consultation:  Elevated liver enzymes  HPI: DONTAVION EVEN Sr. is a 75 y.o. male whom we've been asked to see for elevated liver enzymes.  Patient was admitted for asymmetrical swelling of left lower extremity, with weakness and inability to walk.  He was found to have DVT of that limb, and is on anticoagulation.  We were asked to see for evaluation of elevated liver enzymes.  Patient with prior admission to [facility] a few days ago with acute renal failure and mildly elevated LFTs.  Upon admission this time, his liver enzymes had increased.  Patient had CT scan showing abnormal gallbladder worrisome for cholecystitis.  Patient denies abdominal pain, nausea, vomiting, blood in stool, change in bowel habits, loss-of-appetite, unintentional weight loss.  He denies alcohol use.  He denies prior troubles with liver disease.  He denies recent new medication.  Acute hepatitis panel negative.  No sick contacts.   Past Medical History:  Diagnosis Date  . Diabetes mellitus   . Hypertension     No past surgical history on file.  Prior to Admission medications   Medication Sig Start Date End Date Taking? Authorizing Provider  aspirin EC 81 MG tablet Take 81 mg by mouth every morning.    Yes Historical Provider, MD  cloNIDine (CATAPRES) 0.2 MG tablet Take 0.2 mg by mouth daily.    Yes Historical Provider, MD  furosemide (LASIX) 20 MG tablet Take 10 mg by mouth daily as needed for edema or fluid. 08/14/16  Yes Historical Provider, MD  glipiZIDE (GLUCOTROL) 10 MG tablet Take 10 mg by mouth daily before breakfast.    Yes Historical Provider, MD  hydrALAZINE (APRESOLINE) 25 MG tablet Take 25 mg by mouth every evening.    Yes Historical Provider, MD  lisinopril (PRINIVIL,ZESTRIL) 40 MG tablet Take 20 mg by mouth every evening.    Yes Historical Provider, MD   metFORMIN (GLUCOPHAGE) 1000 MG tablet Take 1,000 mg by mouth every evening.    Yes Historical Provider, MD  simvastatin (ZOCOR) 40 MG tablet Take 40 mg by mouth every evening.    Yes Historical Provider, MD  verapamil (CALAN-SR) 240 MG CR tablet Take 240 mg by mouth at bedtime.   Yes Historical Provider, MD    Current Facility-Administered Medications  Medication Dose Route Frequency Provider Last Rate Last Dose  . albuterol (PROVENTIL) (2.5 MG/3ML) 0.083% nebulizer solution 2.5 mg  2.5 mg Nebulization Q2H PRN Norval Morton, MD      . aspirin EC tablet 81 mg  81 mg Oral Daily Norval Morton, MD   81 mg at 10/13/16 0919  . cloNIDine (CATAPRES) tablet 0.2 mg  0.2 mg Oral Daily Rondell A Tamala Julian, MD   0.2 mg at 10/13/16 0924  . furosemide (LASIX) injection 40 mg  40 mg Intravenous BID Norval Morton, MD   40 mg at 10/14/16 0831  . heparin ADULT infusion 100 units/mL (25000 units/210mL sodium chloride 0.45%)  1,500 Units/hr Intravenous Continuous Laren Everts, RPH 15 mL/hr at 10/14/16 0206 1,500 Units/hr at 10/14/16 0206  . hydrALAZINE (APRESOLINE) tablet 25 mg  25 mg Oral QPM Norval Morton, MD   25 mg at 10/13/16 2216  . insulin aspart (novoLOG) injection 0-5 Units  0-5 Units Subcutaneous QHS Norval Morton, MD   2 Units at 10/13/16 2218  . insulin aspart (novoLOG) injection 0-9 Units  0-9 Units Subcutaneous TID WC Norval Morton, MD   2 Units at 10/14/16 0831  . lisinopril (PRINIVIL,ZESTRIL) tablet 20 mg  20 mg Oral QPM Norval Morton, MD   20 mg at 10/13/16 2216  . ondansetron (ZOFRAN) tablet 4 mg  4 mg Oral Q6H PRN Norval Morton, MD       Or  . ondansetron (ZOFRAN) injection 4 mg  4 mg Intravenous Q6H PRN Norval Morton, MD      . verapamil (CALAN-SR) CR tablet 240 mg  240 mg Oral QHS Norval Morton, MD   240 mg at 10/13/16 2217    Allergies as of 10/12/2016  . (No Known Allergies)    Family History  Problem Relation Age of Onset  . Heart attack Other   . Tuberculosis  Other   . Tuberculosis Other   . Diabetes type II Father   . Hypertension Father   . Heart attack Maternal Aunt     Social History   Social History  . Marital status: Widowed    Spouse name: N/A  . Number of children: N/A  . Years of education: N/A   Occupational History  . Not on file.   Social History Main Topics  . Smoking status: Never Smoker  . Smokeless tobacco: Never Used  . Alcohol use No  . Drug use: No  . Sexual activity: Not on file   Other Topics Concern  . Not on file   Social History Narrative  . No narrative on file    Review of Systems: Positive = bold Gen: Denies any fever, chills, rigors, night sweats, anorexia, fatigue, weakness, malaise, involuntary weight loss, and sleep disorder CV: Denies chest pain, angina, palpitations, syncope, orthopnea, PND, peripheral edema, and claudication. Resp: Denies dyspnea, cough, sputum, wheezing, coughing up blood. GI: Described in detail in HPI.    GU : Denies urinary burning, blood in urine, urinary frequency, urinary hesitancy, nocturnal urination, and urinary incontinence. MS: Denies joint pain or swelling.  Denies muscle weakness, cramps, atrophy.  Derm: Denies rash, itching, oral ulcerations, hives, unhealing ulcers.  Psych: Denies depression, anxiety, memory loss, suicidal ideation, hallucinations,  and confusion. Heme: Denies bruising, bleeding, and enlarged lymph nodes. Neuro:  Denies any headaches, dizziness, paresthesias. Endo:  Denies any problems with DM, thyroid, adrenal function.  Physical Exam: Vital signs in last 24 hours: Temp:  [98.4 F (36.9 C)-98.6 F (37 C)] 98.6 F (37 C) (11/23 0527) Pulse Rate:  [86-117] 86 (11/23 0527) Resp:  [18-20] 18 (11/23 0527) BP: (116-142)/(68-97) 116/68 (11/23 0527) SpO2:  [92 %-98 %] 92 % (11/23 0527) Weight:  [90.6 kg (199 lb 11.8 oz)] 90.6 kg (199 lb 11.8 oz) (11/22 2048) Last BM Date: 10/13/16 General:   Alert, overweight, Well-developed,  well-nourished, pleasant and cooperative in NAD Head:  Normocephalic and atraumatic. Eyes:  Sclera clear, no icterus.   Conjunctiva pink. Ears:  Normal auditory acuity. Nose:  No deformity, discharge,  or lesions. Mouth:  No deformity or lesions.  Oropharynx pink & moist. Neck:  Supple; no masses or thyromegaly. Lungs:  Clear throughout to auscultation.   No wheezes, crackles, or rhonchi. No acute distress. Heart:  Regular rate and rhythm; no murmurs, clicks, rubs,  or gallops. Abdomen:  Soft, protuberant, nontender and nondistended. No masses, hepatosplenomegaly or hernias noted. Normal bowel sounds, without guarding, and without rebound.     Msk:  Symmetrical without gross deformities. Normal posture. Pulses:  Normal pulses noted. Extremities:  Mild edema  left lower extremity; normal right lower extremity without clubbing or edema. Neurologic:  Alert and  oriented x4;  grossly normal neurologically. Skin:  Intact without significant lesions or rashes. Psych:  Alert and cooperative. Normal mood and affect.   Lab Results:  Recent Labs  10/12/16 1707 10/13/16 0309  WBC 11.9* 12.9*  HGB 11.6* 12.4*  HCT 35.2* 36.8*  PLT 370 391   BMET  Recent Labs  10/12/16 1707 10/13/16 0309  NA 132* 136  K 4.1 4.4  CL 98* 98*  CO2 24 27  GLUCOSE 188* 205*  BUN 13 11  CREATININE 1.21 1.28*  CALCIUM 8.3* 8.8*   LFT  Recent Labs  10/13/16 0309  PROT 6.8  ALBUMIN 2.1*  AST 748*  ALT 607*  ALKPHOS 511*  BILITOT 2.8*   PT/INR  Recent Labs  10/13/16 1414  LABPROT 14.8  INR 1.16    Studies/Results: Ct Abdomen Pelvis Wo Contrast  Result Date: 10/13/2016 CLINICAL DATA:  Nausea, vomiting, diarrhea for the last week, diffuse weakness EXAM: CT ABDOMEN AND PELVIS WITHOUT CONTRAST TECHNIQUE: Multidetector CT imaging of the abdomen and pelvis was performed following the standard protocol without IV contrast. COMPARISON:  CT abdomen pelvis of 01/04/2012 FINDINGS: Lower chest: Mild  linear atelectasis is noted at both lung bases. Coronary artery calcifications are present primarily in the distribution of the left anterior descending coronary artery. Hepatobiliary: The liver is unremarkable in the unenhanced state. The gallbladder however is distended and appears thick walled with faintly calcified gallstones. Mild strandiness is noted in the pericholecystic space, and these findings are worrisome for acute cholecystitis. Clinical correlation is recommended. Pancreas: The pancreas appears fatty infiltrated. The pancreatic duct is not dilated. Spleen: The spleen is unremarkable. Adrenals/Urinary Tract: No hydronephrosis is seen. No renal calculi are noted. There is a cyst emanating from the posterior left kidney of 2.7 cm with attenuation of 3 HU. The ureters are normal in caliber. The urinary bladder is moderately well distended with no abnormality noted. Stomach/Bowel: The stomach is only partially distended with oral contrast and food debris. No abnormality is seen. No small bowel distention is noted. The colon is largely decompressed. The terminal ileum and appendix are unremarkable. Vascular/Lymphatic: The abdominal aorta is normal in caliber with moderate abdominal aortic atherosclerosis present. Only small retroperitoneal and mesenteric lymph nodes are present. Reproductive: The prostate gland is normal in size. Other: None Musculoskeletal: The lumbar vertebrae are in normal alignment. Diffuse degenerative disc disease is present throughout the lumbar spine. No compression deformity is seen. The SI joints are corticated with degenerative change noted. IMPRESSION: 1. Distended gallbladder with thickened wall and pericholecystic strandiness as well as gallstones. These findings are highly suspicious for acute cholecystitis and clinical correlation is recommended. 2. Moderate abdominal aortic atherosclerosis. 3. Diffuse degenerative disc disease throughout the lumbar spine. 4. Coronary artery  calcifications. 5. These results will be called to the ordering clinician or representative by the Radiologist Assistant, and communication documented in the PACS or zVision Dashboard. Electronically Signed   By: Ivar Drape M.D.   On: 10/13/2016 14:19   Dg Chest 2 View  Result Date: 10/12/2016 CLINICAL DATA:  Fluid buildup EXAM: CHEST  2 VIEW COMPARISON:  10/07/2016 FINDINGS: There is bilateral mild interstitial thickening. There is no focal parenchymal opacity. There is no pleural effusion or pneumothorax. The heart and mediastinal contours are unremarkable. The osseous structures are unremarkable. IMPRESSION: Mild interstitial thickening concerning for mild interstitial edema. Electronically Signed   By: Kathreen Devoid  On: 10/12/2016 16:37   Impression:  1.  Acute left lower extremity swelling, with DVT, on anticoagulation. 2.  Abnormal CT scan, gallbladder wall thickening with some pericholecystic fluid.  Likely reactive changes to hepatic inflammation.  There is no clinical evidence of cholecystitis (no abdominal pain, benign abdominal exam). 3.  Elevated LFTs, mixed hepatocellular and cholestatic pattern.  No liver failure.  No biliary ductal dilatation.  May be unknown medication reaction or non A/B/C acute hepatitis or even ischemic hepatitis.  Acute hepatitis panel unrevealing.   Plan:  1.  Further liver studies:  Autoimmune serologies, EBV/CMV serologies, quantitative immunoglobulins. 2.  Follow liver enzyme trend.  Given patient's acute DVT, and need for anticoagulation, I feel the risks of liver biopsy far outweigh the benefits, in absence of overt liver failure (which patient does not have). 3.  Echocardiogram might be help assess for potential passive congestive hepatopathy.  4.  [facility] will follow.   LOS: 1 day   Ianmichael Amescua M  10/14/2016, 8:49 AM  Pager [phone] If no answer or after 5 PM call [phone]

## 2016-10-14 NOTE — Progress Notes (Signed)
  Echocardiogram 2D Echocardiogram has been performed.  Diamond Nickel 10/14/2016, 9:55 AM

## 2016-10-14 NOTE — Progress Notes (Signed)
ANTICOAGULATION CONSULT NOTE   Pharmacy Consult for Heparin Indication: DVT  No Known Allergies  Patient Measurements: Height: 5\' 4"  (162.6 cm) Weight: 199 lb 11.8 oz (90.6 kg) IBW/kg (Calculated) : 59.2 Heparin Dosing Weight: 79 kg  Vital Signs: Temp: 98.5 F (36.9 C) (11/23 1554) Temp Source: Oral (11/23 1554) BP: 106/60 (11/23 1554) Pulse Rate: 96 (11/23 1554)  Labs:  Recent Labs  10/12/16 1707  10/13/16 0309 10/13/16 0755 10/13/16 1414 10/13/16 2246 10/14/16 0822 10/14/16 0829 10/14/16 1539  HGB 11.6*  --  12.4*  --   --   --   --  10.8*  --   HCT 35.2*  --  36.8*  --   --   --   --  33.0*  --   PLT 370  --  391  --   --   --   --  402*  --   APTT  --   --   --   --  27  --   --   --   --   LABPROT  --   --   --   --  14.8  --   --   --   --   INR  --   --   --   --  1.16  --   --   --   --   HEPARINUNFRC  --   --   --   --   --  0.15* 0.43  --  0.42  CREATININE 1.21  --  1.28*  --   --   --   --  1.32*  --   TROPONINI  --   < > 0.05* 0.06* 0.05*  --   --   --   --   < > = values in this interval not displayed.  Estimated Creatinine Clearance: 47.6 mL/min (by C-G formula based on SCr of 1.32 mg/dL (H)).   Medical History: Past Medical History:  Diagnosis Date  . CKD (chronic kidney disease), stage III   . Diabetes mellitus   . DVT (deep venous thrombosis) ([place]) 10/14/2016  . Extremity edema 09/2016  . Hyperlipemia   . Hypertension    . heparin 1,500 Units/hr (10/14/16 0206)     Assessment: 39 YOM recently admitted to [facility] from 11/16-11/20 with AKI secondary to N/V/D who re-presented to [facility] on 11/22 with lower extremity swelling. The patient was also noted to have elevated LFTs. Dopplers confirmed a LLE DVT and pharmacy has been consulted to start heparin for anticoagulation. Hg down 10.8, plt ok.  Heparin level currently remains at goal.  No bleeding or complications noted.  Goal of Therapy:  Heparin level 0.3-0.7 units/ml Monitor platelets by  anticoagulation protocol: Yes   Plan:  Continue IV heparin at 1500 units/hr. Will follow-up plans for po anticoagulation once transaminitis resolves Daily heparin levels, CBC Monitor s/sx bleeding   Elicia Lamp, PharmD, BCPS Clinical Pharmacist 10/14/2016 4:45 PM

## 2016-10-14 NOTE — Progress Notes (Signed)
[facility] PROGRESS NOTE  Stephen RAVENSCROFT Sr. [id number] DOB: 23-Jul-1941 DOA: 10/12/2016  PCP: [name], MD  Brief History/Interval Summary: 75 year old African-American male with a past medical history of hypertension, diabetes, presented to the hospital with complaints of leg swelling and inability to walk. Patient was recently hospitalized to [facility] from November 16 November 20 for acute renal failure. He was given IV fluids. He went home and noted that his legs were swollen. His Lasix was not resumed. He found it difficult to ambulate so he presented to the hospital here. He was hospitalized for further management.  Reason for Visit: Lower extremity edema and gait abnormalities  Consultants: None  Procedures:   Lower extremity venous Doppler DVT noted in the left peroneal veins and left soleal veins. No DVT RLE. Left baker's cyst noted.  Transthoracic echocardiogram Study Conclusions - Left ventricle: The cavity size was normal. Wall thickness was   increased in a pattern of mild LVH. Systolic function was   vigorous. The estimated ejection fraction was in the range of 65%   to 70%. Wall motion was normal; there were no regional wall   motion abnormalities. Doppler parameters are consistent with   abnormal left ventricular relaxation (grade 1 diastolic   dysfunction). - Aortic valve: Mildly calcified annulus. Trileaflet. - Mitral valve: There was trivial regurgitation. - Left atrium: The atrium was mildly dilated. - Right atrium: Central venous pressure (est): 3 mm Hg. - Atrial septum: No defect or patent foramen ovale was identified. - Tricuspid valve: There was mild regurgitation. - Pulmonary arteries: PA peak pressure: 35 mm Hg (S). - Pericardium, extracardiac: There was no pericardial effusion.  Impressions: - Mild LVH with sigmoid-shaped basal septum and LVEF 65-70%. Grade   1 diastolic dysfunction. Mild left atrial enlargement.  Trivial   mitral regurgitation. Mild calcified aortic annulus. Mild   tricuspid regurgitation with PASP 35 mmHg.  Antibiotics: None  Subjective/Interval History: Patient feels much better. States that his leg swelling has improved significantly. He is now able to move his legs better than he was before. Denies any chest pain, shortness of breath   ROS: Denies any nausea or vomiting. Denies any abdominal pain.  Objective:  Vital Signs  Vitals:   10/13/16 1722 10/13/16 2048 10/14/16 0527 10/14/16 0849  BP: 121/71 127/68 116/68 110/62  Pulse: (!) 105 (!) 106 86 89  Resp: 20 20 18 16   Temp: 98.4 F (36.9 C) 98.6 F (37 C) 98.6 F (37 C) 98.8 F (37.1 C)  TempSrc: Oral Oral Oral Oral  SpO2: 98% 94% 92% 96%  Weight:  90.6 kg (199 lb 11.8 oz)    Height:        Intake/Output Summary (Last 24 hours) at 10/14/16 1207 Last data filed at 10/14/16 1132  Gross per 24 hour  Intake              960 ml  Output             1220 ml  Net             -260 ml   Filed Weights   10/12/16 1520 10/13/16 0253 10/13/16 2048  Weight: 89.4 kg (197 lb) 90.4 kg (199 lb 4.7 oz) 90.6 kg (199 lb 11.8 oz)    General appearance: alert, cooperative, appears stated age and no distress Resp: clear to auscultation bilaterally Cardio: regular rate and rhythm, S1, S2 normal, no murmur, click, rub or gallop GI: soft, non-tender; bowel sounds normal; no  masses,  no organomegaly Extremities: 1-2+ pitting edema bilateral lower extremities Neurologic: Awake and alert. Oriented 3. Cranial nerves II-12 intact. He would move both upper extremities and noted to be normal. Able to move both lower extremities but strength appears to be 3-4/5 bilaterally  Lab Results:  Data Reviewed: I have personally reviewed following labs and imaging studies  CBC:  Recent Labs Lab 10/07/16 2153 10/08/16 0620 10/12/16 1707 10/13/16 0309 10/14/16 0829  WBC 14.0* 12.5* 11.9* 12.9* 9.2  NEUTROABS 11.4*  --  9.6*  --   --     HGB 13.3 12.1* 11.6* 12.4* 10.8*  HCT 39.3 36.2* 35.2* 36.8* 33.0*  MCV 88.5 87.9 87.3 87.4 87.1  PLT 327 302 370 391 402*    Basic Metabolic Panel:  Recent Labs Lab 10/10/16 0608 10/11/16 0804 10/12/16 1707 10/13/16 0309 10/14/16 0829  NA 137 137 132* 136 136  K 3.9 4.4 4.1 4.4 3.9  CL 107 104 98* 98* 100*  CO2 22 26 24 27 27   GLUCOSE 184* 176* 188* 205* 164*  BUN 27* 19 13 11 12   CREATININE 1.39* 1.35* 1.21 1.28* 1.32*  CALCIUM 7.8* 8.1* 8.3* 8.8* 8.3*  PHOS 2.1* 2.5  --   --   --     GFR: Estimated Creatinine Clearance: 47.6 mL/min (by C-G formula based on SCr of 1.32 mg/dL (H)).  Liver Function Tests:  Recent Labs Lab 10/09/16 [id number] 10/10/16 0608 10/11/16 0804 10/12/16 1707 10/13/16 0309 10/14/16 0829  AST 150* 85*  --  984* 748* 285*  ALT 210* 154*  --  631* 607* 365*  ALKPHOS 127* 145*  --  480* 511* 466*  BILITOT 0.5 0.4  --  1.7* 2.8* 1.4*  PROT 6.8 6.5  --  7.1 6.8 6.3*  ALBUMIN 2.4* 2.3*  2.2* 2.1* 2.1* 2.1* 1.9*     Recent Labs Lab 10/07/16 2153  LIPASE 36   Coagulation Profile:  Recent Labs Lab 10/13/16 1414  INR 1.16    Cardiac Enzymes:  Recent Labs Lab 10/07/16 2153 10/12/16 1735 10/13/16 0309 10/13/16 0755 10/13/16 1414  TROPONINI <0.03 0.03* 0.05* 0.06* 0.05*    CBG:  Recent Labs Lab 10/13/16 1112 10/13/16 1610 10/13/16 2101 10/14/16 0740 10/14/16 1131  GLUCAP 175* 134* 218* 165* 185*     Thyroid Function Tests:  Recent Labs  10/13/16 0309  TSH 1.095     Recent Results (from the past 240 hour(s))  Culture, blood (routine x 2)     Status: None   Collection Time: 10/09/16  4:25 PM  Result Value Ref Range Status   Specimen Description BLOOD RIGHT HAND  Final   Special Requests BOTTLES DRAWN AEROBIC AND ANAEROBIC 6CC  Final   Culture NO GROWTH 5 DAYS  Final   Report Status 10/14/2016 FINAL  Final  Culture, blood (routine x 2)     Status: None   Collection Time: 10/09/16  4:36 PM  Result Value Ref  Range Status   Specimen Description BLOOD LEFT HAND  Final   Special Requests BOTTLES DRAWN AEROBIC AND ANAEROBIC  Final   Culture NO GROWTH 5 DAYS  Final   Report Status 10/14/2016 FINAL  Final      Radiology Studies: Ct Abdomen Pelvis Wo Contrast  Result Date: 10/13/2016 CLINICAL DATA:  Nausea, vomiting, diarrhea for the last week, diffuse weakness EXAM: CT ABDOMEN AND PELVIS WITHOUT CONTRAST TECHNIQUE: Multidetector CT imaging of the abdomen and pelvis was performed following the standard protocol without IV contrast. COMPARISON:  CT abdomen pelvis of 01/04/2012 FINDINGS: Lower chest: Mild linear atelectasis is noted at both lung bases. Coronary artery calcifications are present primarily in the distribution of the left anterior descending coronary artery. Hepatobiliary: The liver is unremarkable in the unenhanced state. The gallbladder however is distended and appears thick walled with faintly calcified gallstones. Mild strandiness is noted in the pericholecystic space, and these findings are worrisome for acute cholecystitis. Clinical correlation is recommended. Pancreas: The pancreas appears fatty infiltrated. The pancreatic duct is not dilated. Spleen: The spleen is unremarkable. Adrenals/Urinary Tract: No hydronephrosis is seen. No renal calculi are noted. There is a cyst emanating from the posterior left kidney of 2.7 cm with attenuation of 3 HU. The ureters are normal in caliber. The urinary bladder is moderately well distended with no abnormality noted. Stomach/Bowel: The stomach is only partially distended with oral contrast and food debris. No abnormality is seen. No small bowel distention is noted. The colon is largely decompressed. The terminal ileum and appendix are unremarkable. Vascular/Lymphatic: The abdominal aorta is normal in caliber with moderate abdominal aortic atherosclerosis present. Only small retroperitoneal and mesenteric lymph nodes are present. Reproductive: The prostate  gland is normal in size. Other: None Musculoskeletal: The lumbar vertebrae are in normal alignment. Diffuse degenerative disc disease is present throughout the lumbar spine. No compression deformity is seen. The SI joints are corticated with degenerative change noted. IMPRESSION: 1. Distended gallbladder with thickened wall and pericholecystic strandiness as well as gallstones. These findings are highly suspicious for acute cholecystitis and clinical correlation is recommended. 2. Moderate abdominal aortic atherosclerosis. 3. Diffuse degenerative disc disease throughout the lumbar spine. 4. Coronary artery calcifications. 5. These results will be called to the ordering clinician or representative by the Radiologist Assistant, and communication documented in the PACS or zVision Dashboard. Electronically Signed   By: Ivar Drape M.D.   On: 10/13/2016 14:19   Dg Chest 2 View  Result Date: 10/12/2016 CLINICAL DATA:  Fluid buildup EXAM: CHEST  2 VIEW COMPARISON:  10/07/2016 FINDINGS: There is bilateral mild interstitial thickening. There is no focal parenchymal opacity. There is no pleural effusion or pneumothorax. The heart and mediastinal contours are unremarkable. The osseous structures are unremarkable. IMPRESSION: Mild interstitial thickening concerning for mild interstitial edema. Electronically Signed   By: Kathreen Devoid   On: 10/12/2016 16:37     Medications:  Scheduled: . aspirin EC  81 mg Oral Daily  . cloNIDine  0.2 mg Oral Daily  . furosemide  40 mg Intravenous BID  . hydrALAZINE  25 mg Oral QPM  . insulin aspart  0-5 Units Subcutaneous QHS  . insulin aspart  0-9 Units Subcutaneous TID WC  . lisinopril  20 mg Oral QPM  . verapamil  240 mg Oral QHS   Continuous: . heparin 1,500 Units/hr (10/14/16 0206)   [id number], ondansetron **OR** ondansetron (ZOFRAN) IV  Assessment/Plan:  Principal Problem:   Bilateral lower extremity edema Active Problems:   Transaminitis   Hyperlipemia    At high risk for fluid overload   Fluid overload   Gait disturbance   Leukocytosis   CKD (chronic kidney disease), stage III   Left leg DVT (HCC)    Bilateral lower extremity edemaWith gait disturbance Patient's inability to ambulate, appears to be more so secondary to his edema rather than a neurological process. No neurological abnormalities noted on examination. He does not have any back Pain. Denies any falls or injuries. Seen by physical therapy. Home health was recommended. Edema is improving.  Continue IV diuretics for 1 more day. TSH is normal. Echocardiogram does show diastolic dysfunction. Systolic function is normal.   Acute Distal left lower extremity DVT Likely due to recent hospitalization and sedentary status. On IV heparin for now. Will benefit from anticoagulation even though distal veins are involved. More than one vein is involved. No known contraindications. Discussed with patient. He agrees. Leave on IV heparin until we assure that he will not need any procedures. And then he can be transitioned to one of the direct oral anticoagulants, depending on renal function.  Elevated troponin/abnormal EKG Patient with nonspecific changes on EKG. No acute ischemic changes noted. Patient denies any chest pains. Echocardiogram does not show any wall motion abnormalities. Do not anticipate any further inpatient testing at this time.  Transaminitis Patient was noted to have elevated transaminases during his recent hospitalization. Those counts improved. Hepatitis panel is negative. He was seen by GI during that time as well.It was felt that his elevated LFTs were due to an ischemic event. Once again, LFTs are elevated. Could be due to passive congestion. CT scan of the abdomen pelvis was done which showed gallstones and gallbladder wall thickening. However, he is nontender. Unlikely to be acute cholecystitis. Gastroenterology consulted. There is a cholestatic pattern with elevated  alkaline phosphatase and elevated bilirubin. These counts are improved today. Coagulation profile was unremarkable. Holding simvastatin.  Essential hypertension Continue home medications. Monitor blood pressures closely.   Diabetes mellitus type 2 with hyperglycemia At home patient well controlled with oral medications of glipizide and metformin. On admission glucose elevated at 180. Holding his oral agents. SSI. HbA1c is pending.  Chronic kidney disease stage 3 Improved. Creatinine improved from previous admission from 4 down to 1.2. Continue to monitor while he is getting diuresed.  Hyperlipidemia Held simvastatin due to elevated transaminases  Hypoalbuminemia Question of poor nutrition contributing in part to peripheral edema.   Normocytic anemia. Anemia panel reviewed. No clear deficiencies identified. No evidence for overt bleeding.  DVT Prophylaxis: IV heparin    Code Status: Full code  Family Communication: Discussed with the patient  Disposition Plan: Management as outlined above. Await improvement. Continue to mobilize.    LOS: 1 day   [facility] Pager [phone] 10/14/2016, 12:07 PM  If 7PM-7AM, please contact night-coverage at [email or web address], password [facility]

## 2016-10-15 LAB — COMPREHENSIVE METABOLIC PANEL
ALT: 255 U/L — AB (ref 17–63)
ANION GAP: 11 (ref 5–15)
AST: 124 U/L — ABNORMAL HIGH (ref 15–41)
Albumin: 2 g/dL — ABNORMAL LOW (ref 3.5–5.0)
Alkaline Phosphatase: 388 U/L — ABNORMAL HIGH (ref 38–126)
BUN: 18 mg/dL (ref 6–20)
CALCIUM: 8 mg/dL — AB (ref 8.9–10.3)
CHLORIDE: 99 mmol/L — AB (ref 101–111)
CO2: 26 mmol/L (ref 22–32)
CREATININE: 1.36 mg/dL — AB (ref 0.61–1.24)
GFR, EST AFRICAN AMERICAN: 56 mL/min — AB (ref >60)
GFR, EST NON AFRICAN AMERICAN: 49 mL/min — AB (ref >60)
Glucose, Bld: 144 mg/dL — ABNORMAL HIGH (ref 65–99)
Potassium: 4.2 mmol/L (ref 3.5–5.1)
SODIUM: 136 mmol/L (ref 135–145)
Total Bilirubin: 0.7 mg/dL (ref 0.3–1.2)
Total Protein: 6.2 g/dL — ABNORMAL LOW (ref 6.5–8.1)

## 2016-10-15 LAB — CBC
HCT: 33.1 % — ABNORMAL LOW (ref 39.0–52.0)
Hemoglobin: 10.8 g/dL — ABNORMAL LOW (ref 13.0–17.0)
MCH: 28.8 pg (ref 26.0–34.0)
MCHC: 32.6 g/dL (ref 30.0–36.0)
MCV: 88.3 fL (ref 78.0–100.0)
PLATELETS: 374 10*3/uL (ref 150–400)
RBC: 3.75 MIL/uL — AB (ref 4.22–5.81)
RDW: 14.3 % (ref 11.5–15.5)
WBC: 11.1 10*3/uL — ABNORMAL HIGH (ref 4.0–10.5)

## 2016-10-15 LAB — IGG, IGA, IGM
IGA: 436 mg/dL (ref 61–437)
IGG (IMMUNOGLOBIN G), SERUM: 1305 mg/dL (ref 700–1600)
IGM, SERUM: 90 mg/dL (ref 15–143)

## 2016-10-15 LAB — HEPARIN LEVEL (UNFRACTIONATED): Heparin Unfractionated: 0.32 IU/mL (ref 0.30–0.70)

## 2016-10-15 LAB — HEMOGLOBIN A1C
Hgb A1c MFr Bld: 8.4 % — ABNORMAL HIGH (ref 4.8–5.6)
MEAN PLASMA GLUCOSE: 194 mg/dL

## 2016-10-15 LAB — EPSTEIN-BARR VIRUS VCA, IGM

## 2016-10-15 LAB — GLUCOSE, CAPILLARY
GLUCOSE-CAPILLARY: 145 mg/dL — AB (ref 65–99)
GLUCOSE-CAPILLARY: 215 mg/dL — AB (ref 65–99)
GLUCOSE-CAPILLARY: 248 mg/dL — AB (ref 65–99)
Glucose-Capillary: 210 mg/dL — ABNORMAL HIGH (ref 65–99)

## 2016-10-15 LAB — CMV IGM

## 2016-10-15 MED ORDER — APIXABAN 5 MG PO TABS
10.0000 mg | ORAL_TABLET | Freq: Two times a day (BID) | ORAL | Status: DC
Start: 2016-10-15 — End: 2016-10-17
  Administered 2016-10-15 – 2016-10-17 (×5): 10 mg via ORAL
  Filled 2016-10-15 (×5): qty 2
  Filled 2016-10-15: qty 4

## 2016-10-15 MED ORDER — APIXABAN 5 MG PO TABS: 5.0000 mg | ORAL_TABLET | Freq: Two times a day (BID) | ORAL | Status: DC

## 2016-10-15 MED ORDER — FUROSEMIDE 10 MG/ML IJ SOLN: 40.0000 mg | Freq: Every day | INTRAMUSCULAR | Status: DC

## 2016-10-15 NOTE — Progress Notes (Signed)
[facility] PROGRESS NOTE  Stephen Rogers Sr. [id number] DOB: 04-Oct-1941 DOA: 10/12/2016  PCP: [name], MD  Brief History/Interval Summary: 75 year old African-American male with a past medical history of hypertension, diabetes, presented to the hospital with complaints of leg swelling and inability to walk. Patient was recently hospitalized to [facility] from November 16 November 20 for acute renal failure. He was given IV fluids. He went home and noted that his legs were swollen. His Lasix was not resumed. He found it difficult to ambulate so he presented to the hospital here. He was hospitalized for further management.  Reason for Visit: Lower extremity edema and gait abnormalities. Acute DVT.  Consultants: Gastroenterology  Procedures:   Lower extremity venous Doppler DVT noted in the left peroneal veins and left soleal veins. No DVT RLE. Left baker's cyst noted.  Transthoracic echocardiogram Study Conclusions - Left ventricle: The cavity size was normal. Wall thickness was   increased in a pattern of mild LVH. Systolic function was   vigorous. The estimated ejection fraction was in the range of 65%   to 70%. Wall motion was normal; there were no regional wall   motion abnormalities. Doppler parameters are consistent with   abnormal left ventricular relaxation (grade 1 diastolic   dysfunction). - Aortic valve: Mildly calcified annulus. Trileaflet. - Mitral valve: There was trivial regurgitation. - Left atrium: The atrium was mildly dilated. - Right atrium: Central venous pressure (est): 3 mm Hg. - Atrial septum: No defect or patent foramen ovale was identified. - Tricuspid valve: There was mild regurgitation. - Pulmonary arteries: PA peak pressure: 35 mm Hg (S). - Pericardium, extracardiac: There was no pericardial effusion.  Impressions: - Mild LVH with sigmoid-shaped basal septum and LVEF 65-70%. Grade   1 diastolic dysfunction. Mild left  atrial enlargement. Trivial   mitral regurgitation. Mild calcified aortic annulus. Mild   tricuspid regurgitation with PASP 35 mmHg.  Antibiotics: None  Subjective/Interval History: Patient continues to feel better. He is able to move his legs much more than he was at the time of admission. Swelling has improved significantly. Denies any chest pain, shortness of breath   ROS: Denies any nausea or vomiting. Denies any abdominal pain.  Objective:  Vital Signs  Vitals:   10/14/16 2103 10/15/16 0537 10/15/16 0923 10/15/16 0955  BP: 116/63 129/66 (!) 122/58 116/65  Pulse: (!) 101 85 94   Resp: 18 19 18    Temp: 98.1 F (36.7 C) 98.5 F (36.9 C) 98.3 F (36.8 C)   TempSrc: Oral Oral Oral   SpO2: 96% 91% 93%   Weight: 90.4 kg (199 lb 4.7 oz)     Height:        Intake/Output Summary (Last 24 hours) at 10/15/16 1015 Last data filed at 10/15/16 1005  Gross per 24 hour  Intake          1441.25 ml  Output             2225 ml  Net          -783.75 ml   Filed Weights   10/13/16 0253 10/13/16 2048 10/14/16 2103  Weight: 90.4 kg (199 lb 4.7 oz) 90.6 kg (199 lb 11.8 oz) 90.4 kg (199 lb 4.7 oz)    General appearance: alert, cooperative, appears stated age and no distress Resp: clear to auscultation bilaterally Cardio: regular rate and rhythm, S1, S2 normal, no murmur, click, rub or gallop GI: soft, non-tender; bowel sounds normal; no masses,  no organomegaly Extremities:  Much improved edema bilateral lower extremities.  Neurologic: Awake and alert. Oriented 3. Cranial nerves II-12 intact. Motor strength equal bilateral upper and lower extremities    Lab Results:  Data Reviewed: I have personally reviewed following labs and imaging studies  CBC:  Recent Labs Lab 10/12/16 1707 10/13/16 0309 10/14/16 0829 10/15/16 0335  WBC 11.9* 12.9* 9.2 11.1*  NEUTROABS 9.6*  --   --   --   HGB 11.6* 12.4* 10.8* 10.8*  HCT 35.2* 36.8* 33.0* 33.1*  MCV 87.3 87.4 87.1 88.3  PLT 370 391  402* [id number]    Basic Metabolic Panel:  Recent Labs Lab 10/10/16 0608 10/11/16 0804 10/12/16 1707 10/13/16 0309 10/14/16 0829 10/15/16 0335  NA 137 137 132* 136 136 136  K 3.9 4.4 4.1 4.4 3.9 4.2  CL 107 104 98* 98* 100* 99*  CO2 22 26 24 27 27 26   GLUCOSE 184* 176* 188* 205* 164* 144*  BUN 27* 19 13 11 12 18   CREATININE 1.39* 1.35* 1.21 1.28* 1.32* 1.36*  CALCIUM 7.8* 8.1* 8.3* 8.8* 8.3* 8.0*  PHOS 2.1* 2.5  --   --   --   --     GFR: Estimated Creatinine Clearance: 46.1 mL/min (by C-G formula based on SCr of 1.36 mg/dL (H)).  Liver Function Tests:  Recent Labs Lab 10/10/16 0608 10/11/16 0804 10/12/16 1707 10/13/16 0309 10/14/16 0829 10/15/16 0335  AST 85*  --  984* 748* 285* 124*  ALT 154*  --  631* 607* 365* 255*  ALKPHOS 145*  --  480* 511* 466* 388*  BILITOT 0.4  --  1.7* 2.8* 1.4* 0.7  PROT 6.5  --  7.1 6.8 6.3* 6.2*  ALBUMIN 2.3*  2.2* 2.1* 2.1* 2.1* 1.9* 2.0*    No results for input(s): LIPASE, AMYLASE in the last 168 hours. Coagulation Profile:  Recent Labs Lab 10/13/16 1414  INR 1.16    Cardiac Enzymes:  Recent Labs Lab 10/12/16 1735 10/13/16 0309 10/13/16 0755 10/13/16 1414  TROPONINI 0.03* 0.05* 0.06* 0.05*    CBG:  Recent Labs Lab 10/14/16 0740 10/14/16 1131 10/14/16 1633 10/14/16 2100 10/15/16 0729  GLUCAP 165* 185* 302* 215* 145*     Thyroid Function Tests:  Recent Labs  10/13/16 0309  TSH 1.095     Recent Results (from the past 240 hour(s))  Culture, blood (routine x 2)     Status: None   Collection Time: 10/09/16  4:25 PM  Result Value Ref Range Status   Specimen Description BLOOD RIGHT HAND  Final   Special Requests BOTTLES DRAWN AEROBIC AND ANAEROBIC 6CC  Final   Culture NO GROWTH 5 DAYS  Final   Report Status 10/14/2016 FINAL  Final  Culture, blood (routine x 2)     Status: None   Collection Time: 10/09/16  4:36 PM  Result Value Ref Range Status   Specimen Description BLOOD LEFT HAND  Final   Special  Requests BOTTLES DRAWN AEROBIC AND ANAEROBIC  Final   Culture NO GROWTH 5 DAYS  Final   Report Status 10/14/2016 FINAL  Final      Radiology Studies: Ct Abdomen Pelvis Wo Contrast  Result Date: 10/13/2016 CLINICAL DATA:  Nausea, vomiting, diarrhea for the last week, diffuse weakness EXAM: CT ABDOMEN AND PELVIS WITHOUT CONTRAST TECHNIQUE: Multidetector CT imaging of the abdomen and pelvis was performed following the standard protocol without IV contrast. COMPARISON:  CT abdomen pelvis of 01/04/2012 FINDINGS: Lower chest: Mild linear atelectasis is noted at both lung bases.  Coronary artery calcifications are present primarily in the distribution of the left anterior descending coronary artery. Hepatobiliary: The liver is unremarkable in the unenhanced state. The gallbladder however is distended and appears thick walled with faintly calcified gallstones. Mild strandiness is noted in the pericholecystic space, and these findings are worrisome for acute cholecystitis. Clinical correlation is recommended. Pancreas: The pancreas appears fatty infiltrated. The pancreatic duct is not dilated. Spleen: The spleen is unremarkable. Adrenals/Urinary Tract: No hydronephrosis is seen. No renal calculi are noted. There is a cyst emanating from the posterior left kidney of 2.7 cm with attenuation of 3 HU. The ureters are normal in caliber. The urinary bladder is moderately well distended with no abnormality noted. Stomach/Bowel: The stomach is only partially distended with oral contrast and food debris. No abnormality is seen. No small bowel distention is noted. The colon is largely decompressed. The terminal ileum and appendix are unremarkable. Vascular/Lymphatic: The abdominal aorta is normal in caliber with moderate abdominal aortic atherosclerosis present. Only small retroperitoneal and mesenteric lymph nodes are present. Reproductive: The prostate gland is normal in size. Other: None Musculoskeletal: The lumbar  vertebrae are in normal alignment. Diffuse degenerative disc disease is present throughout the lumbar spine. No compression deformity is seen. The SI joints are corticated with degenerative change noted. IMPRESSION: 1. Distended gallbladder with thickened wall and pericholecystic strandiness as well as gallstones. These findings are highly suspicious for acute cholecystitis and clinical correlation is recommended. 2. Moderate abdominal aortic atherosclerosis. 3. Diffuse degenerative disc disease throughout the lumbar spine. 4. Coronary artery calcifications. 5. These results will be called to the ordering clinician or representative by the Radiologist Assistant, and communication documented in the PACS or zVision Dashboard. Electronically Signed   By: Ivar Drape M.D.   On: 10/13/2016 14:19     Medications:  Scheduled: . aspirin EC  81 mg Oral Daily  . cloNIDine  0.2 mg Oral Daily  . [START ON 10/16/2016] furosemide  40 mg Intravenous Daily  . hydrALAZINE  25 mg Oral QPM  . insulin aspart  0-5 Units Subcutaneous QHS  . insulin aspart  0-9 Units Subcutaneous TID WC  . lisinopril  20 mg Oral QPM  . verapamil  240 mg Oral QHS   Continuous: . heparin 1,500 Units/hr (10/14/16 1841)   [id number], ondansetron **OR** ondansetron (ZOFRAN) IV  Assessment/Plan:  Principal Problem:   Bilateral lower extremity edema Active Problems:   Transaminitis   Hyperlipemia   At high risk for fluid overload   Fluid overload   Gait disturbance   Leukocytosis   CKD (chronic kidney disease), stage III   Left leg DVT (HCC)    Bilateral lower extremity edemaWith gait disturbance Patient's inability to ambulate, appears to have been secondary to his edema rather than a neurological process. No neurological abnormalities noted on examination. Patient's symptoms have improved with the diuresis. He is able to move his legs without any difficulty at this time. Seen by physical therapy and they feel that he  will benefit from short-term rehabilitation at skilled nursing facility. Continue IV diuretics for 1 more day. TSH is normal. Echocardiogram does show diastolic dysfunction. Systolic function is normal.   Acute Distal left lower extremity DVT Likely due to recent hospitalization and sedentary status. Initially placed on IV heparin. Discussed with gastroenterology. No procedures are planned. Okay to transition to oral anticoagulation in the form of Eliquis. Will benefit from anticoagulation even though distal veins are involved. More than one vein is involved. No known contraindications.  Discussed with patient. He agrees.   Elevated troponin/abnormal EKG Patient with nonspecific changes on EKG. No acute ischemic changes noted. Patient denies any chest pains. Echocardiogram does not show any wall motion abnormalities. Do not anticipate any further inpatient testing at this time.  Transaminitis Patient was noted to have elevated transaminases during his recent hospitalization. Those counts improved. Hepatitis panel is negative. He was seen by GI during that time as well.It was felt that his elevated LFTs were due to an ischemic event. Once again, LFTs are elevated. Could be due to passive congestion. CT scan of the abdomen pelvis was done which showed gallstones and gallbladder wall thickening. However, he is nontender. Unlikely to be acute cholecystitis. Gastroenterology consulted. There is a cholestatic pattern with elevated alkaline phosphatase and elevated bilirubin. These counts are continuing to improve. Testing has been ordered by gastroenterology. This can be followed by gastroenterology as outpatient. Coagulation profile was unremarkable. Holding simvastatin. Should continue to be held when he is discharged as we don't have a clear reason for his transaminitis.  Essential hypertension Continue home medications. Monitor blood pressures closely.   Diabetes mellitus type 2 with hyperglycemia At  home patient well controlled with oral medications of glipizide and metformin. Holding his oral agents. SSI. HbA1c is 8.4.  Chronic kidney disease stage 3 Improved. Creatinine improved from previous admission from 4 down to 1.2. Renal function is stable. Continue to monitor while he is getting diuresed.  Hyperlipidemia Continue to hold simvastatin due to elevated transaminases  Hypoalbuminemia Question of poor nutrition contributing in part to peripheral edema.   Normocytic anemia. Anemia panel reviewed. No clear deficiencies identified. No evidence for overt bleeding.  DVT Prophylaxis: IV heparin to be transitioned to Eliquis  Code Status: Full code  Family Communication: Discussed with the patient  Disposition Plan: Will need short-term rehabilitation at skilled nursing facility. Anticipate discharge tomorrow.    LOS: 2 days   [facility] Pager [phone] 10/15/2016, 10:15 AM  If 7PM-7AM, please contact night-coverage at [email or web address], password [facility]

## 2016-10-15 NOTE — Progress Notes (Signed)
[facility] Gastroenterology Progress Note  Stephen Rogers Sr. 75 y.o. 1941/10/25  CC:  Abnormal LFTs   Subjective: Patient doing fine. LFTs improving. Denied abdominal pain, nausea, vomiting.  ROS : Negative for abdominal pain, nausea, vomiting, chest pain   Objective: Vital signs in last 24 hours: Vitals:   10/15/16 0923 10/15/16 0955  BP: (!) 122/58 116/65  Pulse: 94   Resp: 18   Temp: 98.3 F (36.8 C)     Physical Exam:  General:  Alert, cooperative, no distress, appears stated age  Head:  Normocephalic, without obvious abnormality, atraumatic  Eyes:  , EOM's intact,   Lungs:   Clear to auscultation bilaterally, respirations unlabored  Heart:  Regular rate and rhythm, S1, S2 normal  Abdomen:   Soft, non-tender, bowel sounds active all four quadrants,  no masses,   Extremities: Trace  edema       Lab Results:  Recent Labs  10/14/16 0829 10/15/16 0335  NA 136 136  K 3.9 4.2  CL 100* 99*  CO2 27 26  GLUCOSE 164* 144*  BUN 12 18  CREATININE 1.32* 1.36*  CALCIUM 8.3* 8.0*    Recent Labs  10/14/16 0829 10/15/16 0335  AST 285* 124*  ALT 365* 255*  ALKPHOS 466* 388*  BILITOT 1.4* 0.7  PROT 6.3* 6.2*  ALBUMIN 1.9* 2.0*    Recent Labs  10/12/16 1707  10/14/16 0829 10/15/16 0335  WBC 11.9*  < > 9.2 11.1*  NEUTROABS 9.6*  --   --   --   HGB 11.6*  < > 10.8* 10.8*  HCT 35.2*  < > 33.0* 33.1*  MCV 87.3  < > 87.1 88.3  PLT 370  < > 402* 374  < > = values in this interval not displayed.  Recent Labs  10/13/16 1414  LABPROT 14.8  INR 1.16      Assessment/Plan: Abnormal LFTs. Improving. Mixed pattern. Most likely resolving ischemic hepatitis. Hepatitis panel negative. Acute left lower extremity DVT. On anticoagulation - Abnormal  CT scan showing gallbladder wall thickening. Patient denied abdominal pain - DM   Recommendations -------------------------- - Follow secondary markers of liver disease ( ordered yesterday- Autoimmune serologies,  EBV/CMV serologies, quantitative immunoglobulins.Marland Kitchen) - Patient has elevated ferritin more than thousand which could be from acute phase reaction. Normal iron saturation. - Monitor LFTs. GI will follow   Otis Brace MD, [place] 10/15/2016, 10:13 AM  Pager [phone]  If no answer or after 5 PM call [phone]

## 2016-10-15 NOTE — Progress Notes (Signed)
ANTICOAGULATION CONSULT NOTE   Pharmacy Consult for Heparin Indication: DVT  No Known Allergies  Patient Measurements: Height: 5\' 4"  (162.6 cm) Weight: 199 lb 4.7 oz (90.4 kg) IBW/kg (Calculated) : 59.2 Heparin Dosing Weight: 79 kg  Vital Signs: Temp: 98.5 F (36.9 C) (11/24 0537) Temp Source: Oral (11/24 0537) BP: 129/66 (11/24 0537) Pulse Rate: 85 (11/24 0537)  Labs:  Recent Labs  10/13/16 0309 10/13/16 0755 10/13/16 1414  10/14/16 0822 10/14/16 0829 10/14/16 1539 10/15/16 0335  HGB 12.4*  --   --   --   --  10.8*  --  10.8*  HCT 36.8*  --   --   --   --  33.0*  --  33.1*  PLT 391  --   --   --   --  402*  --  374  APTT  --   --  27  --   --   --   --   --   LABPROT  --   --  14.8  --   --   --   --   --   INR  --   --  1.16  --   --   --   --   --   HEPARINUNFRC  --   --   --   < > 0.43  --  0.42 0.32  CREATININE 1.28*  --   --   --   --  1.32*  --  1.36*  TROPONINI 0.05* 0.06* 0.05*  --   --   --   --   --   < > = values in this interval not displayed.  Estimated Creatinine Clearance: 46.1 mL/min (by C-G formula based on SCr of 1.36 mg/dL (H)).   Medical History: Past Medical History:  Diagnosis Date  . CKD (chronic kidney disease), stage III   . Diabetes mellitus   . DVT (deep venous thrombosis) ([place]) 10/14/2016  . Extremity edema 09/2016  . Hyperlipemia   . Hypertension    . heparin 1,500 Units/hr (10/14/16 1841)     Assessment: 24 YOM recently admitted to [facility] from 11/16-11/20 with AKI secondary to N/V/D who re-presented to [facility] on 11/22 with lower extremity swelling. The patient was also noted to have elevated LFTs. Dopplers confirmed a LLE DVT and pharmacy has been consulted to start heparin for anticoagulation. Hg low but stable at 10.8, plt ok.  Heparin level currently remains at goal.  No bleeding or complications noted.  Goal of Therapy:  Heparin level 0.3-0.7 units/ml Monitor platelets by anticoagulation protocol: Yes   Plan:  Continue  IV heparin at 1500 units/hr. Will follow-up plans for po anticoagulation once transaminitis resolves Daily heparin levels, CBC Monitor s/sx bleeding   Uvaldo Rising, BCPS  Clinical Pharmacist Pager [phone]  10/15/2016 8:30 AM

## 2016-10-15 NOTE — Progress Notes (Signed)
Physical Therapy Treatment Patient Details Name: Stephen NEDROW Sr. MRN: [id number] DOB: [birth year] Today's Date: 10/15/2016    History of Present Illness  Stephen CHAUDHRY Sr. is a 75 y.o. male with medical history significant of HTN and DM type II; who presents with complaints of leg swelling and inability to walk.     PT Comments    Progressing slowly with mobility although improving medically.  Needs continued rehab at the SNF level for strengthening his trunk and LE's to improve function.   Follow Up Recommendations  SNF     Equipment Recommendations  None recommended by PT    Recommendations for Other Services       Precautions / Restrictions Precautions Precautions: Fall    Mobility  Bed Mobility Overal bed mobility: Needs Assistance Bed Mobility: Supine to Sit     Supine to sit: Supervision     General bed mobility comments: extra time and effort  Transfers Overall transfer level: Needs assistance Equipment used: Rolling walker (2 wheeled) Transfers: Sit to/from Stand Sit to Stand: Min guard (x3) Stand pivot transfers: Min assist       General transfer comment: cues for hand placement  Ambulation/Gait Ambulation/Gait assistance: Min assist Ambulation Distance (Feet): 3 Feet (forward and back to recliner) Assistive device: Rolling walker (2 wheeled)   Gait velocity: slow   General Gait Details: gait continues to be antalgic and weak in nature.  Pt with significantly flexed posture.   Stairs            Wheelchair Mobility    Modified Rankin (Stroke Patients Only)       Balance     Sitting balance-Leahy Scale: Good       Standing balance-Leahy Scale: Poor Standing balance comment: moderately heavy use/reliance on the RW                    Cognition Arousal/Alertness: Awake/alert Behavior During Therapy: WFL for tasks assessed/performed Overall Cognitive Status: Within Functional Limits for tasks assessed                      Exercises      General Comments        Pertinent Vitals/Pain Pain Assessment: Faces Faces Pain Scale: No hurt    Home Living                      Prior Function            PT Goals (current goals can now be found in the care plan section) Acute Rehab PT Goals Patient Stated Goal: home to my daughter's home able to walk. PT Goal Formulation: With patient Time For Goal Achievement: 10/20/16 Potential to Achieve Goals: Fair Progress towards PT goals: Progressing toward goals    Frequency    Min 3X/week      PT Plan Discharge plan needs to be updated    Co-evaluation             End of Session   Activity Tolerance: Patient tolerated treatment well;Other (comment) (limited by weakness) Patient left: in chair;with call bell/phone within reach     Time: 1005-1032 PT Time Calculation (min) (ACUTE ONLY): 27 min  Charges:  $Therapeutic Activity: 8-22 mins                    G CodesTessie Fass Kaidence Rogers 10/15/2016, 10:47 AM 10/15/2016  Stephen Rogers, PT  [phone]  (pager)

## 2016-10-15 NOTE — Progress Notes (Addendum)
ANTICOAGULATION CONSULT NOTE   Pharmacy Consult for Heparin > Eliquis Indication: DVT  No Known Allergies  Patient Measurements: Height: 5\' 4"  (162.6 cm) Weight: 199 lb 4.7 oz (90.4 kg) IBW/kg (Calculated) : 59.2 Heparin Dosing Weight: 79 kg  Vital Signs: Temp: 98.3 F (36.8 C) (11/24 0923) Temp Source: Oral (11/24 0923) BP: 116/65 (11/24 0955) Pulse Rate: 94 (11/24 0923)  Labs:  Recent Labs  10/13/16 0309 10/13/16 0755 10/13/16 1414  10/14/16 0822 10/14/16 0829 10/14/16 1539 10/15/16 0335  HGB 12.4*  --   --   --   --  10.8*  --  10.8*  HCT 36.8*  --   --   --   --  33.0*  --  33.1*  PLT 391  --   --   --   --  402*  --  374  APTT  --   --  27  --   --   --   --   --   LABPROT  --   --  14.8  --   --   --   --   --   INR  --   --  1.16  --   --   --   --   --   HEPARINUNFRC  --   --   --   < > 0.43  --  0.42 0.32  CREATININE 1.28*  --   --   --   --  1.32*  --  1.36*  TROPONINI 0.05* 0.06* 0.05*  --   --   --   --   --   < > = values in this interval not displayed.  Estimated Creatinine Clearance: 46.1 mL/min (by C-G formula based on SCr of 1.36 mg/dL (H)).   Medical History: Past Medical History:  Diagnosis Date  . CKD (chronic kidney disease), stage III   . Diabetes mellitus   . DVT (deep venous thrombosis) ([place]) 10/14/2016  . Extremity edema 09/2016  . Hyperlipemia   . Hypertension       Assessment: 18 YOM recently admitted to [facility] from 11/16-11/20 with AKI secondary to N/V/D who re-presented to [facility] on 11/22 with lower extremity swelling. The patient was also noted to have elevated LFTs. Dopplers confirmed a LLE DVT and pharmacy has been consulted to start heparin for anticoagulation. Hg low but stable at 10.8, plt ok.  Heparin level currently remains at goal.  No bleeding or complications noted.  PM: Asked by Dr. [place] Pink to change to Eliquis today.  No further procedures planned by GI.  Goal of Therapy:  Monitor platelets by anticoagulation  protocol: Yes   Plan:  D/c IV heparin now. Give 1st dose of Eliquis as heparin is d/c'd. Eliquis 10 mg BID x 7 days, then Eliquis 5 mg BID. Will complete Eliquis education with patient.   Stephen Rogers, BCPS  Clinical Pharmacist Pager [phone]  10/15/2016 3:15 PM

## 2016-10-16 ENCOUNTER — Inpatient Hospital Stay (HOSPITAL_COMMUNITY): Payer: Commercial Managed Care - HMO

## 2016-10-16 LAB — COMPREHENSIVE METABOLIC PANEL
ALK PHOS: 521 U/L — AB (ref 38–126)
ALT: 241 U/L — ABNORMAL HIGH (ref 17–63)
ANION GAP: 12 (ref 5–15)
AST: 187 U/L — ABNORMAL HIGH (ref 15–41)
Albumin: 2.2 g/dL — ABNORMAL LOW (ref 3.5–5.0)
BILIRUBIN TOTAL: 0.8 mg/dL (ref 0.3–1.2)
BUN: 26 mg/dL — ABNORMAL HIGH (ref 6–20)
CALCIUM: 8.6 mg/dL — AB (ref 8.9–10.3)
CO2: 27 mmol/L (ref 22–32)
CREATININE: 1.42 mg/dL — AB (ref 0.61–1.24)
Chloride: 98 mmol/L — ABNORMAL LOW (ref 101–111)
GFR calc non Af Amer: 46 mL/min — ABNORMAL LOW (ref >60)
GFR, EST AFRICAN AMERICAN: 53 mL/min — AB (ref >60)
Glucose, Bld: 181 mg/dL — ABNORMAL HIGH (ref 65–99)
Potassium: 4.2 mmol/L (ref 3.5–5.1)
Sodium: 137 mmol/L (ref 135–145)
TOTAL PROTEIN: 7.2 g/dL (ref 6.5–8.1)

## 2016-10-16 LAB — GLUCOSE, CAPILLARY
Glucose-Capillary: 172 mg/dL — ABNORMAL HIGH (ref 65–99)
Glucose-Capillary: 184 mg/dL — ABNORMAL HIGH (ref 65–99)
Glucose-Capillary: 190 mg/dL — ABNORMAL HIGH (ref 65–99)
Glucose-Capillary: 184 mg/dL — ABNORMAL HIGH (ref 65–99)

## 2016-10-16 LAB — HEPARIN LEVEL (UNFRACTIONATED): Heparin Unfractionated: 2.01 IU/mL — ABNORMAL HIGH (ref 0.30–0.70)

## 2016-10-16 LAB — LACTIC ACID, PLASMA: LACTIC ACID, VENOUS: 0.9 mmol/L (ref 0.5–1.9)

## 2016-10-16 MED ORDER — METOPROLOL TARTRATE 5 MG/5ML IV SOLN
5.0000 mg | Freq: Once | INTRAVENOUS | Status: AC
  Administered 2016-10-16: 5 mg via INTRAVENOUS
  Filled 2016-10-16: qty 5

## 2016-10-16 MED ORDER — PIPERACILLIN-TAZOBACTAM 3.375 G IVPB
3.3750 g | Freq: Three times a day (TID) | INTRAVENOUS | Status: DC
  Administered 2016-10-17 – 2016-10-21 (×14): 3.375 g via INTRAVENOUS
  Filled 2016-10-16 (×17): qty 50

## 2016-10-16 MED ORDER — PIPERACILLIN-TAZOBACTAM 3.375 G IVPB 30 MIN
3.3750 g | Freq: Once | INTRAVENOUS | Status: AC
  Administered 2016-10-16: 3.375 g via INTRAVENOUS
  Filled 2016-10-16: qty 50

## 2016-10-16 MED ORDER — FUROSEMIDE 40 MG PO TABS
40.0000 mg | ORAL_TABLET | Freq: Every day | ORAL | Status: DC
  Administered 2016-10-16 – 2016-10-21 (×5): 40 mg via ORAL
  Filled 2016-10-16 (×5): qty 1

## 2016-10-16 NOTE — Progress Notes (Signed)
[facility] PROGRESS NOTE  KENDLE BRUSTER Sr. [id number] DOB: 1941/02/04 DOA: 10/12/2016  PCP: [name], MD  Brief History/Interval Summary: 75 year old African-American male with a past medical history of hypertension, diabetes, presented to the hospital with complaints of leg swelling and inability to walk. Patient was recently hospitalized to [facility] from November 16 November 20 for acute renal failure. He was given IV fluids. He went home and noted that his legs were swollen. His Lasix was not resumed. He found it difficult to ambulate so he presented to the hospital here. He was hospitalized for further management.  Reason for Visit: Lower extremity edema and gait abnormalities. Acute DVT.  Consultants: Gastroenterology  Procedures:   Lower extremity venous Doppler DVT noted in the left peroneal veins and left soleal veins. No DVT RLE. Left baker's cyst noted.  Transthoracic echocardiogram Study Conclusions - Left ventricle: The cavity size was normal. Wall thickness was   increased in a pattern of mild LVH. Systolic function was   vigorous. The estimated ejection fraction was in the range of 65%   to 70%. Wall motion was normal; there were no regional wall   motion abnormalities. Doppler parameters are consistent with   abnormal left ventricular relaxation (grade 1 diastolic   dysfunction). - Aortic valve: Mildly calcified annulus. Trileaflet. - Mitral valve: There was trivial regurgitation. - Left atrium: The atrium was mildly dilated. - Right atrium: Central venous pressure (est): 3 mm Hg. - Atrial septum: No defect or patent foramen ovale was identified. - Tricuspid valve: There was mild regurgitation. - Pulmonary arteries: PA peak pressure: 35 mm Hg (S). - Pericardium, extracardiac: There was no pericardial effusion.  Impressions: - Mild LVH with sigmoid-shaped basal septum and LVEF 65-70%. Grade   1 diastolic dysfunction. Mild left  atrial enlargement. Trivial   mitral regurgitation. Mild calcified aortic annulus. Mild   tricuspid regurgitation with PASP 35 mmHg.  Antibiotics: None  Subjective/Interval History: Patient denies any complaints. He feels much better. Able to move his legs around and ambulate with assistance.   ROS: Denies any nausea or vomiting. Denies any abdominal pain.  Objective:  Vital Signs  Vitals:   10/15/16 0923 10/15/16 0955 10/15/16 2133 10/16/16 0902  BP: (!) 122/58 116/65 132/80 (!) 159/93  Pulse: 94  96 (!) 101  Resp: 18   18  Temp: 98.3 F (36.8 C)  99.6 F (37.6 C) 98.4 F (36.9 C)  TempSrc: Oral  Oral Oral  SpO2: 93%  100% 100%  Weight:   87.9 kg (193 lb 11.2 oz)   Height:   5\' 6"  (1.676 m)     Intake/Output Summary (Last 24 hours) at 10/16/16 1224 Last data filed at 10/16/16 1043  Gross per 24 hour  Intake             1080 ml  Output              875 ml  Net              205 ml   Filed Weights   10/13/16 2048 10/14/16 2103 10/15/16 2133  Weight: 90.6 kg (199 lb 11.8 oz) 90.4 kg (199 lb 4.7 oz) 87.9 kg (193 lb 11.2 oz)    General appearance: alert, cooperative, appears stated age and no distress Resp: clear to auscultation bilaterally Cardio: regular rate and rhythm, S1, S2 normal, no murmur, click, rub or gallop GI: soft, non-tender; bowel sounds normal; no masses,  no organomegaly Extremities: Much improved edema  bilateral lower extremities.  Neurologic: Awake and alert. Oriented 3. Cranial nerves II-12 intact. Motor strength equal bilateral upper and lower extremities    Lab Results:  Data Reviewed: I have personally reviewed following labs and imaging studies  CBC:  Recent Labs Lab 10/12/16 1707 10/13/16 0309 10/14/16 0829 10/15/16 0335  WBC 11.9* 12.9* 9.2 11.1*  NEUTROABS 9.6*  --   --   --   HGB 11.6* 12.4* 10.8* 10.8*  HCT 35.2* 36.8* 33.0* 33.1*  MCV 87.3 87.4 87.1 88.3  PLT 370 391 402* [id number]    Basic Metabolic Panel:  Recent  Labs Lab 10/10/16 0608 10/11/16 0804 10/12/16 1707 10/13/16 0309 10/14/16 0829 10/15/16 0335 10/16/16 0507  NA 137 137 132* 136 136 136 137  K 3.9 4.4 4.1 4.4 3.9 4.2 4.2  CL 107 104 98* 98* 100* 99* 98*  CO2 22 26 24 27 27 26 27   GLUCOSE 184* 176* 188* 205* 164* 144* 181*  BUN 27* 19 13 11 12 18  26*  CREATININE 1.39* 1.35* 1.21 1.28* 1.32* 1.36* 1.42*  CALCIUM 7.8* 8.1* 8.3* 8.8* 8.3* 8.0* 8.6*  PHOS 2.1* 2.5  --   --   --   --   --     GFR: Estimated Creatinine Clearance: 45.2 mL/min (by C-G formula based on SCr of 1.42 mg/dL (H)).  Liver Function Tests:  Recent Labs Lab 10/12/16 1707 10/13/16 0309 10/14/16 0829 10/15/16 0335 10/16/16 0507  AST 984* 748* 285* 124* 187*  ALT 631* 607* 365* 255* 241*  ALKPHOS 480* 511* 466* 388* 521*  BILITOT 1.7* 2.8* 1.4* 0.7 0.8  PROT 7.1 6.8 6.3* 6.2* 7.2  ALBUMIN 2.1* 2.1* 1.9* 2.0* 2.2*    No results for input(s): LIPASE, AMYLASE in the last 168 hours. Coagulation Profile:  Recent Labs Lab 10/13/16 1414  INR 1.16    Cardiac Enzymes:  Recent Labs Lab 10/12/16 1735 10/13/16 0309 10/13/16 0755 10/13/16 1414  TROPONINI 0.03* 0.05* 0.06* 0.05*    CBG:  Recent Labs Lab 10/15/16 1110 10/15/16 1612 10/15/16 2148 10/16/16 0730 10/16/16 1211  GLUCAP 215* 248* 210* 172* 184*     Thyroid Function Tests: No results for input(s): TSH, T4TOTAL, FREET4, T3FREE, THYROIDAB in the last 72 hours.   Recent Results (from the past 240 hour(s))  Culture, blood (routine x 2)     Status: None   Collection Time: 10/09/16  4:25 PM  Result Value Ref Range Status   Specimen Description BLOOD RIGHT HAND  Final   Special Requests BOTTLES DRAWN AEROBIC AND ANAEROBIC 6CC  Final   Culture NO GROWTH 5 DAYS  Final   Report Status 10/14/2016 FINAL  Final  Culture, blood (routine x 2)     Status: None   Collection Time: 10/09/16  4:36 PM  Result Value Ref Range Status   Specimen Description BLOOD LEFT HAND  Final   Special  Requests BOTTLES DRAWN AEROBIC AND ANAEROBIC  Final   Culture NO GROWTH 5 DAYS  Final   Report Status 10/14/2016 FINAL  Final      Radiology Studies: No results found.   Medications:  Scheduled: . apixaban  10 mg Oral BID   Followed by  . [START ON 10/22/2016] apixaban  5 mg Oral BID  . aspirin EC  81 mg Oral Daily  . cloNIDine  0.2 mg Oral Daily  . furosemide  40 mg Oral Daily  . hydrALAZINE  25 mg Oral QPM  . insulin aspart  0-5 Units Subcutaneous QHS  .  insulin aspart  0-9 Units Subcutaneous TID WC  . lisinopril  20 mg Oral QPM  . verapamil  240 mg Oral QHS   Continuous:  [id number], ondansetron **OR** ondansetron (ZOFRAN) IV  Assessment/Plan:  Principal Problem:   Bilateral lower extremity edema Active Problems:   Transaminitis   Hyperlipemia   At high risk for fluid overload   Fluid overload   Gait disturbance   Leukocytosis   CKD (chronic kidney disease), stage III   Left leg DVT (HCC)    Bilateral lower extremity edemaWith gait disturbance Patient's inability to ambulate, appears to have been secondary to his edema rather than a neurological process. No neurological abnormalities noted on examination. Patient's symptoms have improved with the diuresis. He is able to move his legs without any difficulty at this time. Seen by physical therapy and they feel that he will benefit from short-term rehabilitation at skilled nursing facility. Change to oral diuretics today. TSH is normal. Echocardiogram does show diastolic dysfunction. Systolic function is normal.   Acute Distal left lower extremity DVT Likely due to recent hospitalization and sedentary status. Initially placed on IV heparin. Transitioned to Eliquis. Will benefit from anticoagulation even though distal veins are involved. More than one vein is involved. No known contraindications. Discussed with patient. He agrees.   Elevated troponin/abnormal EKG Patient with nonspecific changes on EKG. No acute  ischemic changes noted. Patient denies any chest pains. Echocardiogram does not show any wall motion abnormalities. Do not anticipate any further inpatient testing at this time.  Transaminitis Patient was noted to have elevated transaminases during his recent hospitalization. Those counts improved. Hepatitis panel is negative. He was seen by GI during that time as well.It was felt that his elevated LFTs were due to an ischemic event. Once again, LFTs are elevated. Could be due to passive congestion. CT scan of the abdomen pelvis was done which showed gallstones and gallbladder wall thickening. However, he is nontender. Unlikely to be acute cholecystitis. Gastroenterology consulted. There is a cholestatic pattern with elevated alkaline phosphatase and elevated bilirubin. These counts are continuing to improve. Testing has been ordered by gastroenterology. This can be followed by gastroenterology as outpatient. Today GI has ordered MRCP. Coagulation profile was unremarkable. Holding simvastatin. Should continue to be held when he is discharged as we don't have a clear reason for his transaminitis.  Essential hypertension Continue home medications. Monitor blood pressures closely.   Diabetes mellitus type 2 with hyperglycemia Holding his oral agents. SSI. HbA1c is 8.4.  Chronic kidney disease stage 3 Improved. Creatinine improved from previous admission from 4 down to 1.2. Renal function is stable.   Hyperlipidemia Continue to hold simvastatin due to elevated transaminases  Hypoalbuminemia Question of poor nutrition contributing in part to peripheral edema.   Normocytic anemia. Anemia panel reviewed. No clear deficiencies identified. No evidence for overt bleeding.  DVT Prophylaxis: Now on Eliquis  Code Status: Full code  Family Communication: Discussed with the patient  Disposition Plan: Will need short-term rehabilitation at skilled nursing facility. MRCP ordered by  gastroenterology.    LOS: 3 days   [facility] Pager [phone] 10/16/2016, 12:24 PM  If 7PM-7AM, please contact night-coverage at [email or web address], password [facility]

## 2016-10-16 NOTE — Progress Notes (Signed)
[facility] Progress Note  Subjective: No GI complaints. Tolerating diet  Objective: Vital signs in last 24 hours: Temp:  [98.3 F (36.8 C)-99.6 F (37.6 C)] 99.6 F (37.6 C) (11/24 2133) Pulse Rate:  [94-96] 96 (11/24 2133) Resp:  [18] 18 (11/24 0923) BP: (116-132)/(58-80) 132/80 (11/24 2133) SpO2:  [93 %-100 %] 100 % (11/24 2133) Weight:  [87.9 kg (193 lb 11.2 oz)] 87.9 kg (193 lb 11.2 oz) (11/24 2133) Weight change: -2.538 kg (-5 lb 9.5 oz)   PE: Abdomen soft  Lab Results: Results for orders placed or performed during the hospital encounter of 10/12/16 (from the past 24 hour(s))  Glucose, capillary     Status: Abnormal   Collection Time: 10/15/16 11:10 AM  Result Value Ref Range   Glucose-Capillary 215 (H) 65 - 99 mg/dL  Glucose, capillary     Status: Abnormal   Collection Time: 10/15/16  4:12 PM  Result Value Ref Range   Glucose-Capillary 248 (H) 65 - 99 mg/dL  Glucose, capillary     Status: Abnormal   Collection Time: 10/15/16  9:48 PM  Result Value Ref Range   Glucose-Capillary 210 (H) 65 - 99 mg/dL  Comprehensive metabolic panel     Status: Abnormal   Collection Time: 10/16/16  5:07 AM  Result Value Ref Range   Sodium 137 135 - 145 mmol/L   Potassium 4.2 3.5 - 5.1 mmol/L   Chloride 98 (L) 101 - 111 mmol/L   CO2 27 22 - 32 mmol/L   Glucose, Bld 181 (H) 65 - 99 mg/dL   BUN 26 (H) 6 - 20 mg/dL   Creatinine, Ser 1.42 (H) 0.61 - 1.24 mg/dL   Calcium 8.6 (L) 8.9 - 10.3 mg/dL   Total Protein 7.2 6.5 - 8.1 g/dL   Albumin 2.2 (L) 3.5 - 5.0 g/dL   AST 187 (H) 15 - 41 U/L   ALT 241 (H) 17 - 63 U/L   Alkaline Phosphatase 521 (H) 38 - 126 U/L   Total Bilirubin 0.8 0.3 - 1.2 mg/dL   GFR calc non Af Amer 46 (L) >60 mL/min   GFR calc Af Amer 53 (L) >60 mL/min   Anion gap 12 5 - 15  Glucose, capillary     Status: Abnormal   Collection Time: 10/16/16  7:30 AM  Result Value Ref Range   Glucose-Capillary 172 (H) 65 - 99 mg/dL   Comment 1 Notify RN    Comment  2 Document in Chart     Studies/Results: No results found.    Assessment: Elevated liver function tests, radiologic evidence of  cholecystitis with gallstones, serologic workup pending  Plan: Await serological workup Based on gallbladder abnormalities and gallstones we'll obtain MRCP to better rule out common bile duct stones.   Eleesha Purkey C 10/16/2016, 8:25 AM  Pager [phone] If no answer or after 5 PM call [phone]

## 2016-10-16 NOTE — Progress Notes (Signed)
Pharmacy Antibiotic Note  Stephen VALADE Sr. is a 75 y.o. male with an intra-abdominal infection (concern of cholecystitis).  Pharmacy has been consulted for zosyn dosing. -WBC= 11.1,tmax= 102.8, SCr= 1.42, CrCl ~ 45  Plan: Zosyn 3.375g IV q8h (4 hour infusion).  Will follow renal function, cultures and clinical progress   Height: 5\' 6"  (167.6 cm) Weight: 193 lb 12.6 oz (87.9 kg) IBW/kg (Calculated) : 63.8  Temp (24hrs), Avg:99.7 F (37.6 C), Min:98.1 F (36.7 C), Max:102.8 F (39.3 C)   Recent Labs Lab 10/12/16 1707 10/13/16 0309 10/14/16 0829 10/15/16 0335 10/16/16 0507  WBC 11.9* 12.9* 9.2 11.1*  --   CREATININE 1.21 1.28* 1.32* 1.36* 1.42*    Estimated Creatinine Clearance: 45.2 mL/min (by C-G formula based on SCr of 1.42 mg/dL (H)).    No Known Allergies  Antimicrobials this admission: 11/25 zosyn>>  Dose adjustments this admission:   Microbiology results: 11/25 blood x2  Thank you for allowing pharmacy to be a part of this patient's care.  Hildred Laser, Pharm D 10/16/2016 9:20 PM

## 2016-10-17 ENCOUNTER — Inpatient Hospital Stay (HOSPITAL_COMMUNITY): Payer: Commercial Managed Care - HMO

## 2016-10-17 LAB — BLOOD CULTURE ID PANEL (REFLEXED)
ACINETOBACTER BAUMANNII: NOT DETECTED
CANDIDA ALBICANS: NOT DETECTED
CANDIDA GLABRATA: NOT DETECTED
Candida krusei: NOT DETECTED
Candida parapsilosis: NOT DETECTED
Candida tropicalis: NOT DETECTED
Carbapenem resistance: NOT DETECTED
ENTEROBACTER CLOACAE COMPLEX: NOT DETECTED
ENTEROBACTERIACEAE SPECIES: DETECTED — AB
ESCHERICHIA COLI: DETECTED — AB
Enterococcus species: NOT DETECTED
HAEMOPHILUS INFLUENZAE: NOT DETECTED
KLEBSIELLA PNEUMONIAE: NOT DETECTED
Klebsiella oxytoca: NOT DETECTED
Listeria monocytogenes: NOT DETECTED
NEISSERIA MENINGITIDIS: NOT DETECTED
PSEUDOMONAS AERUGINOSA: NOT DETECTED
Proteus species: NOT DETECTED
STREPTOCOCCUS AGALACTIAE: NOT DETECTED
STREPTOCOCCUS PYOGENES: NOT DETECTED
STREPTOCOCCUS SPECIES: NOT DETECTED
Serratia marcescens: NOT DETECTED
Staphylococcus aureus (BCID): NOT DETECTED
Staphylococcus species: NOT DETECTED
Streptococcus pneumoniae: NOT DETECTED

## 2016-10-17 LAB — COMPREHENSIVE METABOLIC PANEL
ALK PHOS: 581 U/L — AB (ref 38–126)
ALT: 224 U/L — ABNORMAL HIGH (ref 17–63)
ANION GAP: 11 (ref 5–15)
AST: 260 U/L — ABNORMAL HIGH (ref 15–41)
Albumin: 1.8 g/dL — ABNORMAL LOW (ref 3.5–5.0)
BILIRUBIN TOTAL: 2.7 mg/dL — AB (ref 0.3–1.2)
BUN: 18 mg/dL (ref 6–20)
CALCIUM: 7.7 mg/dL — AB (ref 8.9–10.3)
CO2: 27 mmol/L (ref 22–32)
Chloride: 90 mmol/L — ABNORMAL LOW (ref 101–111)
Creatinine, Ser: 1.43 mg/dL — ABNORMAL HIGH (ref 0.61–1.24)
GFR calc non Af Amer: 46 mL/min — ABNORMAL LOW (ref >60)
GFR, EST AFRICAN AMERICAN: 53 mL/min — AB (ref >60)
Glucose, Bld: 179 mg/dL — ABNORMAL HIGH (ref 65–99)
Potassium: 3.6 mmol/L (ref 3.5–5.1)
Sodium: 128 mmol/L — ABNORMAL LOW (ref 135–145)
TOTAL PROTEIN: 6.3 g/dL — AB (ref 6.5–8.1)

## 2016-10-17 LAB — CBC WITH DIFFERENTIAL/PLATELET
Basophils Absolute: 0 10*3/uL (ref 0.0–0.1)
Basophils Relative: 0 %
EOS ABS: 0 10*3/uL (ref 0.0–0.7)
Eosinophils Relative: 0 %
HCT: 31.3 % — ABNORMAL LOW (ref 39.0–52.0)
HEMOGLOBIN: 10.3 g/dL — AB (ref 13.0–17.0)
LYMPHS ABS: 1.3 10*3/uL (ref 0.7–4.0)
Lymphocytes Relative: 6 %
MCH: 28.9 pg (ref 26.0–34.0)
MCHC: 32.9 g/dL (ref 30.0–36.0)
MCV: 87.9 fL (ref 78.0–100.0)
MONO ABS: 1.3 10*3/uL — AB (ref 0.1–1.0)
MONOS PCT: 6 %
NEUTROS PCT: 88 %
Neutro Abs: 18.5 10*3/uL — ABNORMAL HIGH (ref 1.7–7.7)
Platelets: 390 10*3/uL (ref 150–400)
RBC: 3.56 MIL/uL — ABNORMAL LOW (ref 4.22–5.81)
RDW: 13.9 % (ref 11.5–15.5)
WBC: 21 10*3/uL — ABNORMAL HIGH (ref 4.0–10.5)

## 2016-10-17 LAB — URINALYSIS, ROUTINE W REFLEX MICROSCOPIC
Glucose, UA: NEGATIVE mg/dL
KETONES UR: NEGATIVE mg/dL
NITRITE: NEGATIVE
PROTEIN: 30 mg/dL — AB
Specific Gravity, Urine: 1.016 (ref 1.005–1.030)
pH: 5 (ref 5.0–8.0)

## 2016-10-17 LAB — GLUCOSE, CAPILLARY
GLUCOSE-CAPILLARY: 151 mg/dL — AB (ref 65–99)
Glucose-Capillary: 158 mg/dL — ABNORMAL HIGH (ref 65–99)
Glucose-Capillary: 145 mg/dL — ABNORMAL HIGH (ref 65–99)

## 2016-10-17 LAB — ANTI-SMOOTH MUSCLE ANTIBODY, IGG: F-ACTIN AB IGG: 18 U (ref 0–19)

## 2016-10-17 LAB — URINE MICROSCOPIC-ADD ON: WBC, UA: NONE SEEN WBC/hpf (ref 0–5)

## 2016-10-17 LAB — MITOCHONDRIAL ANTIBODIES: MITOCHONDRIAL M2 AB, IGG: 6.2 U (ref 0.0–20.0)

## 2016-10-17 MED ORDER — HEPARIN (PORCINE) IN NACL 100-0.45 UNIT/ML-% IJ SOLN
1500.0000 [IU]/h | INTRAMUSCULAR | Status: DC
  Administered 2016-10-17: 1500 [IU]/h via INTRAVENOUS
  Filled 2016-10-17: qty 250

## 2016-10-17 MED ORDER — ACETAMINOPHEN 325 MG PO TABS
650.0000 mg | ORAL_TABLET | ORAL | Status: DC | PRN
  Administered 2016-10-17 – 2016-10-25 (×10): 650 mg via ORAL
  Filled 2016-10-17 (×11): qty 2

## 2016-10-17 NOTE — Progress Notes (Signed)
[facility] for switch from  Eliquis back to IV Heparin due to Holding apixaban as he may need surgery. Indication: acute LLE DVT  No Known Allergies  Patient Measurements: Height: 5\' 6"  (167.6 cm) Weight: 193 lb 12.6 oz (87.9 kg) IBW/kg (Calculated) : 63.8 Heparin Dosing Weight: 82 kg  Vital Signs: Temp: 101.2 F (38.4 C) (11/26 0843) Temp Source: Oral (11/26 0843) BP: 162/78 (11/26 0843) Pulse Rate: 116 (11/26 0843)  Labs:  Recent Labs  10/14/16 1539 10/15/16 0335 10/16/16 0507 10/17/16 0917  HGB  --  10.8*  --  10.3*  HCT  --  33.1*  --  31.3*  PLT  --  374  --  390  HEPARINUNFRC 0.42 0.32 2.01*  --   CREATININE  --  1.36* 1.42* 1.43*    Estimated Creatinine Clearance: 44.9 mL/min (by C-G formula based on SCr of 1.43 mg/dL (H)).   Medical History: Past Medical History:  Diagnosis Date  . CKD (chronic kidney disease), stage III   . Diabetes mellitus   . DVT (deep venous thrombosis) ([place]) 10/14/2016  . Extremity edema 09/2016  . Hyperlipemia   . Hypertension       Assessment: Stephen Rogers recently admitted to [facility] from 11/16-11/20 with AKI secondary to N/V/D who re-presented to [facility] on 11/22 with lower extremity swelling. The patient was also noted to have elevated LFTs. Dopplers confirmed a LLE DVT and pharmacy was consulted to  start Apixaban (Eliquis) anticoagulation.  -Cholelithiasis with possible chronic cholecystitis. Patient may need surgery thus holding Apixaban. Pharmacy consulted to switch to IV Heparin.  Apixaban started on 11/24, last received apixaban10mg  at 10:21 this AM.  Start IV heparin infusion at least 12h after last apixaban dose if CrCr is >30 ml/m. His estimated CrCl is ~ 45 ml/min .   Hg low but stable at 10.3, pltc remains wnl.  No bleeding noted. Previously on IV heparin drip 11/22-11/24/17 before apixaban started and heparin level was therapeutic on 1500 units/hr IV heparin.  Apixaban effect will elevate  the heparin level, thus we will adjust IV heparin monitoring the aPTT until effect of apixaban resolves.   Goal of Therapy:  APTT 66-102 seconds for DVT Heparin level =0.3-0.7 units/ml (when effect of apixaban resolved) Monitor platelets by anticoagulation protocol: Yes   Plan:  Apixaban (Eliquis) Dc'd, last given at 10:21 AM. At 22:30 tonight start IV heparin drip 1500 units/hr (no bolus) 6hr aPTT/Heparin level- monitor aPTT until effect of Apixaban resolved. Daily aPTT, HL, CBC   Nicole Cella, RPh Clinical Pharmacist Pager: [phone]  10/17/2016 1:44 PM

## 2016-10-17 NOTE — Progress Notes (Signed)
On call physician notified of patient's current vitals.  Heart rate:120 Temperature: 102.8 Bp:155/86 SpO2: 93% on Room Air.   Stat blood cultures ordered and stat Zosyn started per order.

## 2016-10-17 NOTE — Progress Notes (Signed)
PHARMACY - PHYSICIAN COMMUNICATION CRITICAL VALUE ALERT - BLOOD CULTURE IDENTIFICATION (BCID)  Results for orders placed or performed during the hospital encounter of 10/12/16  Blood Culture ID Panel (Reflexed) (Collected: 10/16/2016 10:25 PM)  Result Value Ref Range   Enterococcus species NOT DETECTED NOT DETECTED   Listeria monocytogenes NOT DETECTED NOT DETECTED   Staphylococcus species NOT DETECTED NOT DETECTED   Staphylococcus aureus NOT DETECTED NOT DETECTED   Streptococcus species NOT DETECTED NOT DETECTED   Streptococcus agalactiae NOT DETECTED NOT DETECTED   Streptococcus pneumoniae NOT DETECTED NOT DETECTED   Streptococcus pyogenes NOT DETECTED NOT DETECTED   Acinetobacter baumannii NOT DETECTED NOT DETECTED   Enterobacteriaceae species DETECTED (A) NOT DETECTED   Enterobacter cloacae complex NOT DETECTED NOT DETECTED   Escherichia coli DETECTED (A) NOT DETECTED   Klebsiella oxytoca NOT DETECTED NOT DETECTED   Klebsiella pneumoniae NOT DETECTED NOT DETECTED   Proteus species NOT DETECTED NOT DETECTED   Serratia marcescens NOT DETECTED NOT DETECTED   Carbapenem resistance NOT DETECTED NOT DETECTED   Haemophilus influenzae NOT DETECTED NOT DETECTED   Neisseria meningitidis NOT DETECTED NOT DETECTED   Pseudomonas aeruginosa NOT DETECTED NOT DETECTED   Candida albicans NOT DETECTED NOT DETECTED   Candida glabrata NOT DETECTED NOT DETECTED   Candida krusei NOT DETECTED NOT DETECTED   Candida parapsilosis NOT DETECTED NOT DETECTED   Candida tropicalis NOT DETECTED NOT DETECTED    Name of physician (or Provider) Contacted: Dr. Bonnielee Haff Changes to prescribed antibiotics required:  MD wants to continue on IV Zosyn for now.   Nicole Cella, RPh Clinical Pharmacist 10/17/2016  2:26 PM

## 2016-10-17 NOTE — Progress Notes (Signed)
[facility] Progress Note  Subjective: No abdominal pain  Objective: Vital signs in last 24 hours: Temp:  [98.1 F (36.7 C)-102.9 F (39.4 C)] 99.4 F (37.4 C) (11/26 0548) Pulse Rate:  [97-130] 118 (11/26 0548) Resp:  [16-18] 16 (11/26 0548) BP: (121-166)/(69-93) 155/75 (11/26 0548) SpO2:  [93 %-100 %] 97 % (11/26 0548) Weight:  [87.9 kg (193 lb 12.6 oz)] 87.9 kg (193 lb 12.6 oz) (11/25 2039) Weight change: 0.038 kg (1.4 oz)   [id number] soft minimally tender RUQ  Lab Results: Results for orders placed or performed during the hospital encounter of 10/12/16 (from the past 24 hour(s))  Glucose, capillary     Status: Abnormal   Collection Time: 10/16/16 12:11 PM  Result Value Ref Range   Glucose-Capillary 184 (H) 65 - 99 mg/dL  Glucose, capillary     Status: Abnormal   Collection Time: 10/16/16  4:26 PM  Result Value Ref Range   Glucose-Capillary 190 (H) 65 - 99 mg/dL   Comment 1 Notify RN    Comment 2 Document in Chart   Glucose, capillary     Status: Abnormal   Collection Time: 10/16/16  8:38 PM  Result Value Ref Range   Glucose-Capillary 184 (H) 65 - 99 mg/dL  Lactic acid, plasma     Status: None   Collection Time: 10/16/16 10:00 PM  Result Value Ref Range   Lactic Acid, Venous 0.9 0.5 - 1.9 mmol/L    Studies/Results: Mr Abdomen Mrcp Wo Contrast  Result Date: 10/16/2016 CLINICAL DATA:  Patient with history of cholecystitis. Evaluate for choledocholithiasis. EXAM: MRI ABDOMEN WITHOUT CONTRAST  (INCLUDING MRCP) TECHNIQUE: Multiplanar multisequence MR imaging of the abdomen was performed. Heavily T2-weighted images of the biliary and pancreatic ducts were obtained, and three-dimensional MRCP images were rendered by post processing. COMPARISON:  CT abdomen pelvis 10/13/2016. FINDINGS: Lower chest: Dependent atelectasis within the lower lobes bilaterally. Hepatobiliary: Liver is normal in size and contour. The gallbladder is distended and contains multiple stones  and sludge. There is gallbladder wall thickening and a small amount of pericholecystic fluid. Evaluation of the common bile duct is limited due to motion artifact. Common bile duct is normal in diameter measuring 6 mm without definite intraluminal filling defect identified. No intrahepatic biliary ductal dilatation. Pancreas:  Mildly atrophic. Spleen:  Unremarkable Adrenals/Urinary Tract: The adrenal glands are normal. There is a 2.7 cm cyst within the interpolar region of the left kidney. Small 1.5 cm T2 bright structure within the superior pole of the right kidney, favored to represent a small cyst. Stomach/Bowel: No abnormal bowel wall thickening or evidence for bowel obstruction. Vascular/Lymphatic: Normal caliber abdominal aorta. No retroperitoneal lymphadenopathy. Other:  None. Musculoskeletal: Unremarkable. IMPRESSION: Stones and sludge within the gallbladder lumen. There is gallbladder wall thickening, distention of the gallbladder and mild surrounding fluid, raising the possibility of acute cholecystitis. Evaluation of the common bile duct is limited secondary to motion artifact however the common bile duct is normal in caliber without definite intraluminal filling defect to suggest choledocholithiasis. Electronically Signed   By: Lovey Newcomer M.D.   On: 10/16/2016 20:38   Mr 3d Recon At Scanner  Result Date: 10/16/2016 CLINICAL DATA:  Patient with history of cholecystitis. Evaluate for choledocholithiasis. EXAM: MRI ABDOMEN WITHOUT CONTRAST  (INCLUDING MRCP) TECHNIQUE: Multiplanar multisequence MR imaging of the abdomen was performed. Heavily T2-weighted images of the biliary and pancreatic ducts were obtained, and three-dimensional MRCP images were rendered by post processing. COMPARISON:  CT abdomen pelvis 10/13/2016. FINDINGS: Lower chest:  Dependent atelectasis within the lower lobes bilaterally. Hepatobiliary: Liver is normal in size and contour. The gallbladder is distended and contains multiple  stones and sludge. There is gallbladder wall thickening and a small amount of pericholecystic fluid. Evaluation of the common bile duct is limited due to motion artifact. Common bile duct is normal in diameter measuring 6 mm without definite intraluminal filling defect identified. No intrahepatic biliary ductal dilatation. Pancreas:  Mildly atrophic. Spleen:  Unremarkable Adrenals/Urinary Tract: The adrenal glands are normal. There is a 2.7 cm cyst within the interpolar region of the left kidney. Small 1.5 cm T2 bright structure within the superior pole of the right kidney, favored to represent a small cyst. Stomach/Bowel: No abnormal bowel wall thickening or evidence for bowel obstruction. Vascular/Lymphatic: Normal caliber abdominal aorta. No retroperitoneal lymphadenopathy. Other:  None. Musculoskeletal: Unremarkable. IMPRESSION: Stones and sludge within the gallbladder lumen. There is gallbladder wall thickening, distention of the gallbladder and mild surrounding fluid, raising the possibility of acute cholecystitis. Evaluation of the common bile duct is limited secondary to motion artifact however the common bile duct is normal in caliber without definite intraluminal filling defect to suggest choledocholithiasis. Electronically Signed   By: Lovey Newcomer M.D.   On: 10/16/2016 20:38   Dg Chest Port 1 View  Result Date: 10/16/2016 CLINICAL DATA:  Dyspnea EXAM: PORTABLE CHEST 1 VIEW COMPARISON:  10/12/2016 FINDINGS: Shallow inspiration with linear atelectasis in the lung bases. Heart size and pulmonary vascularity are normal. No focal consolidation in the lungs. No blunting of costophrenic angles. No pneumothorax. IMPRESSION: Shallow inspiration with atelectasis in the lung bases. Electronically Signed   By: Lucienne Capers M.D.   On: 10/16/2016 22:58      Assessment: Deep vein thrombosis Elevated liver function tests. Radiologic evidence of cholecystitis out of proportion to clinical  features Hepatitis studies negative, other serologic workup pending MRCP equivocal for possible common bile duct stones  Plan: Given uncertainty regarding cholecystitis as well as the temperature spike yesterday, recommend surgical opinion regarding likely cholecystitis. If cholecystectomy elected intraoperative cholangiogram to determine need for ERCP. Would need anticoagulation held for surgery or ERCP. Will follow up on serologic workup and continue to follow with you.    Resean Brander C 10/17/2016, 8:35 AM  Pager [phone] If no answer or after 5 PM call [phone]

## 2016-10-17 NOTE — Progress Notes (Signed)
[facility] PROGRESS NOTE  Stephen Rogers Sr. [id number] DOB: 1941/11/09 DOA: 10/12/2016  PCP: [name], MD  Brief History/Interval Summary: 75 year old African-American male with a past medical history of hypertension, diabetes, presented to the hospital with complaints of leg swelling and inability to walk. Patient was recently hospitalized to [facility] from November 16 November 20 for acute renal failure. He was given IV fluids. He went home and noted that his legs were swollen. His Lasix was not resumed. He found it difficult to ambulate so he presented to the hospital here. He was hospitalized for further management. Patient diuresed well. He was also found to have acute DVT for which he was placed on anticoagulation. His LFTs were improving, but then he started spiking fever. He was seen by gastroenterology. Subsequently, general surgery was consulted as well for suspected cholecystitis.  Reason for Visit: Lower extremity edema and gait abnormalities. Acute DVT.  Consultants: Gastroenterology. General surgery  Procedures:   Lower extremity venous Doppler DVT noted in the left peroneal veins and left soleal veins. No DVT RLE. Left baker's cyst noted.  Transthoracic echocardiogram Study Conclusions - Left ventricle: The cavity size was normal. Wall thickness was   increased in a pattern of mild LVH. Systolic function was   vigorous. The estimated ejection fraction was in the range of 65%   to 70%. Wall motion was normal; there were no regional wall   motion abnormalities. Doppler parameters are consistent with   abnormal left ventricular relaxation (grade 1 diastolic   dysfunction). - Aortic valve: Mildly calcified annulus. Trileaflet. - Mitral valve: There was trivial regurgitation. - Left atrium: The atrium was mildly dilated. - Right atrium: Central venous pressure (est): 3 mm Hg. - Atrial septum: No defect or patent foramen ovale was  identified. - Tricuspid valve: There was mild regurgitation. - Pulmonary arteries: PA peak pressure: 35 mm Hg (S). - Pericardium, extracardiac: There was no pericardial effusion.  Impressions: - Mild LVH with sigmoid-shaped basal septum and LVEF 65-70%. Grade   1 diastolic dysfunction. Mild left atrial enlargement. Trivial   mitral regurgitation. Mild calcified aortic annulus. Mild   tricuspid regurgitation with PASP 35 mmHg.  Antibiotics: Zosyn 11/25  Subjective/Interval History: Patient denies any complaints. Denies any chest pain or shortness of breath. States that his legs feel better. He mentions that he did have some nausea last night but did not have any vomiting.   ROS: Denies any cough, diarrhea, or dysuria, joint pains, skin rashes.  Objective:  Vital Signs  Vitals:   10/16/16 2210 10/17/16 0318 10/17/16 0548 10/17/16 0843  BP: (!) 155/78 (!) 166/77 (!) 155/75 (!) 162/78  Pulse: (!) 130 (!) 115 (!) 118 (!) 116  Resp:   16 16  Temp: (!) 102.2 F (39 C) (!) 102.9 F (39.4 C) 99.4 F (37.4 C) (!) 101.2 F (38.4 C)  TempSrc: Oral Oral Oral Oral  SpO2: 93% 96% 97% 98%  Weight:      Height:        Intake/Output Summary (Last 24 hours) at 10/17/16 1139 Last data filed at 10/17/16 1000  Gross per 24 hour  Intake              970 ml  Output             1125 ml  Net             -155 ml   Filed Weights   10/14/16 2103 10/15/16 2133 10/16/16 2039  Weight:  90.4 kg (199 lb 4.7 oz) 87.9 kg (193 lb 11.2 oz) 87.9 kg (193 lb 12.6 oz)    General appearance: alert, cooperative, appears stated age and no distress Resp: clear to auscultation bilaterally Cardio: S1 S2 is tachycardic this morning. No S3, S4. No rubs, murmurs or bruit. Minimal pedal edema.  GI: Abdomen is soft. Patient did experience some tenderness this morning in the right upper quadrant on palpation. No rebound, rigidity or guarding. Percell Miller sign is equivocal. Bowel sounds are present. No masses or  organomegaly. Extremities: improved edema bilateral lower extremities.  Neurologic: Awake and alert. Oriented 3. Cranial nerves II-12 intact. Motor strength equal bilateral upper and lower extremities    Lab Results:  Data Reviewed: I have personally reviewed following labs and imaging studies  CBC:  Recent Labs Lab 10/12/16 1707 10/13/16 0309 10/14/16 0829 10/15/16 0335 10/17/16 0917  WBC 11.9* 12.9* 9.2 11.1* 21.0*  NEUTROABS 9.6*  --   --   --  18.5*  HGB 11.6* 12.4* 10.8* 10.8* 10.3*  HCT 35.2* 36.8* 33.0* 33.1* 31.3*  MCV 87.3 87.4 87.1 88.3 87.9  PLT 370 391 402* 374 [id number]    Basic Metabolic Panel:  Recent Labs Lab 10/11/16 0804  10/13/16 0309 10/14/16 0829 10/15/16 0335 10/16/16 0507 10/17/16 0917  NA 137  < > 136 136 136 137 128*  K 4.4  < > 4.4 3.9 4.2 4.2 3.6  CL 104  < > 98* 100* 99* 98* 90*  CO2 26  < > 27 27 26 27 27   GLUCOSE 176*  < > 205* 164* 144* 181* 179*  BUN 19  < > 11 12 18  26* 18  CREATININE 1.35*  < > 1.28* 1.32* 1.36* 1.42* 1.43*  CALCIUM 8.1*  < > 8.8* 8.3* 8.0* 8.6* 7.7*  PHOS 2.5  --   --   --   --   --   --   < > = values in this interval not displayed.  GFR: Estimated Creatinine Clearance: 44.9 mL/min (by C-G formula based on SCr of 1.43 mg/dL (H)).  Liver Function Tests:  Recent Labs Lab 10/13/16 0309 10/14/16 0829 10/15/16 0335 10/16/16 0507 10/17/16 0917  AST 748* 285* 124* 187* 260*  ALT 607* 365* 255* 241* 224*  ALKPHOS 511* 466* 388* 521* 581*  BILITOT 2.8* 1.4* 0.7 0.8 2.7*  PROT 6.8 6.3* 6.2* 7.2 6.3*  ALBUMIN 2.1* 1.9* 2.0* 2.2* 1.8*    No results for input(s): LIPASE, AMYLASE in the last 168 hours. Coagulation Profile:  Recent Labs Lab 10/13/16 1414  INR 1.16    Cardiac Enzymes:  Recent Labs Lab 10/12/16 1735 10/13/16 0309 10/13/16 0755 10/13/16 1414  TROPONINI 0.03* 0.05* 0.06* 0.05*    CBG:  Recent Labs Lab 10/15/16 2148 10/16/16 0730 10/16/16 1211 10/16/16 1626 10/16/16 2038   GLUCAP 210* 172* 184* 190* 184*     Thyroid Function Tests: No results for input(s): TSH, T4TOTAL, FREET4, T3FREE, THYROIDAB in the last 72 hours.   Recent Results (from the past 240 hour(s))  Culture, blood (routine x 2)     Status: None   Collection Time: 10/09/16  4:25 PM  Result Value Ref Range Status   Specimen Description BLOOD RIGHT HAND  Final   Special Requests BOTTLES DRAWN AEROBIC AND ANAEROBIC [place]  Final   Culture NO GROWTH 5 DAYS  Final   Report Status 10/14/2016 FINAL  Final  Culture, blood (routine x 2)     Status: None   Collection Time:  10/09/16  4:36 PM  Result Value Ref Range Status   Specimen Description BLOOD LEFT HAND  Final   Special Requests BOTTLES DRAWN AEROBIC AND ANAEROBIC  Final   Culture NO GROWTH 5 DAYS  Final   Report Status 10/14/2016 FINAL  Final      Radiology Studies: Mr Abdomen Mrcp Wo Contrast  Result Date: 10/16/2016 CLINICAL DATA:  Patient with history of cholecystitis. Evaluate for choledocholithiasis. EXAM: MRI ABDOMEN WITHOUT CONTRAST  (INCLUDING MRCP) TECHNIQUE: Multiplanar multisequence MR imaging of the abdomen was performed. Heavily T2-weighted images of the biliary and pancreatic ducts were obtained, and three-dimensional MRCP images were rendered by post processing. COMPARISON:  CT abdomen pelvis 10/13/2016. FINDINGS: Lower chest: Dependent atelectasis within the lower lobes bilaterally. Hepatobiliary: Liver is normal in size and contour. The gallbladder is distended and contains multiple stones and sludge. There is gallbladder wall thickening and a small amount of pericholecystic fluid. Evaluation of the common bile duct is limited due to motion artifact. Common bile duct is normal in diameter measuring 6 mm without definite intraluminal filling defect identified. No intrahepatic biliary ductal dilatation. Pancreas:  Mildly atrophic. Spleen:  Unremarkable Adrenals/Urinary Tract: The adrenal glands are normal. There is a 2.7 cm cyst  within the interpolar region of the left kidney. Small 1.5 cm T2 bright structure within the superior pole of the right kidney, favored to represent a small cyst. Stomach/Bowel: No abnormal bowel wall thickening or evidence for bowel obstruction. Vascular/Lymphatic: Normal caliber abdominal aorta. No retroperitoneal lymphadenopathy. Other:  None. Musculoskeletal: Unremarkable. IMPRESSION: Stones and sludge within the gallbladder lumen. There is gallbladder wall thickening, distention of the gallbladder and mild surrounding fluid, raising the possibility of acute cholecystitis. Evaluation of the common bile duct is limited secondary to motion artifact however the common bile duct is normal in caliber without definite intraluminal filling defect to suggest choledocholithiasis. Electronically Signed   By: Lovey Newcomer M.D.   On: 10/16/2016 20:38   Mr 3d Recon At Scanner  Result Date: 10/16/2016 CLINICAL DATA:  Patient with history of cholecystitis. Evaluate for choledocholithiasis. EXAM: MRI ABDOMEN WITHOUT CONTRAST  (INCLUDING MRCP) TECHNIQUE: Multiplanar multisequence MR imaging of the abdomen was performed. Heavily T2-weighted images of the biliary and pancreatic ducts were obtained, and three-dimensional MRCP images were rendered by post processing. COMPARISON:  CT abdomen pelvis 10/13/2016. FINDINGS: Lower chest: Dependent atelectasis within the lower lobes bilaterally. Hepatobiliary: Liver is normal in size and contour. The gallbladder is distended and contains multiple stones and sludge. There is gallbladder wall thickening and a small amount of pericholecystic fluid. Evaluation of the common bile duct is limited due to motion artifact. Common bile duct is normal in diameter measuring 6 mm without definite intraluminal filling defect identified. No intrahepatic biliary ductal dilatation. Pancreas:  Mildly atrophic. Spleen:  Unremarkable Adrenals/Urinary Tract: The adrenal glands are normal. There is a 2.7 cm  cyst within the interpolar region of the left kidney. Small 1.5 cm T2 bright structure within the superior pole of the right kidney, favored to represent a small cyst. Stomach/Bowel: No abnormal bowel wall thickening or evidence for bowel obstruction. Vascular/Lymphatic: Normal caliber abdominal aorta. No retroperitoneal lymphadenopathy. Other:  None. Musculoskeletal: Unremarkable. IMPRESSION: Stones and sludge within the gallbladder lumen. There is gallbladder wall thickening, distention of the gallbladder and mild surrounding fluid, raising the possibility of acute cholecystitis. Evaluation of the common bile duct is limited secondary to motion artifact however the common bile duct is normal in caliber without definite intraluminal filling defect to  suggest choledocholithiasis. Electronically Signed   By: Lovey Newcomer M.D.   On: 10/16/2016 20:38   Dg Chest Port 1 View  Result Date: 10/16/2016 CLINICAL DATA:  Dyspnea EXAM: PORTABLE CHEST 1 VIEW COMPARISON:  10/12/2016 FINDINGS: Shallow inspiration with linear atelectasis in the lung bases. Heart size and pulmonary vascularity are normal. No focal consolidation in the lungs. No blunting of costophrenic angles. No pneumothorax. IMPRESSION: Shallow inspiration with atelectasis in the lung bases. Electronically Signed   By: Lucienne Capers M.D.   On: 10/16/2016 22:58     Medications:  Scheduled: . apixaban  10 mg Oral BID   Followed by  . [START ON 10/22/2016] apixaban  5 mg Oral BID  . aspirin EC  81 mg Oral Daily  . cloNIDine  0.2 mg Oral Daily  . furosemide  40 mg Oral Daily  . hydrALAZINE  25 mg Oral QPM  . insulin aspart  0-5 Units Subcutaneous QHS  . insulin aspart  0-9 Units Subcutaneous TID WC  . lisinopril  20 mg Oral QPM  . piperacillin-tazobactam (ZOSYN)  IV  3.375 g Intravenous Q8H  . verapamil  240 mg Oral QHS   Continuous:  [id number], albuterol, ondansetron **OR** ondansetron (ZOFRAN) IV  Assessment/Plan:  Principal  Problem:   Bilateral lower extremity edema Active Problems:   Transaminitis   Hyperlipemia   At high risk for fluid overload   Fluid overload   Gait disturbance   Leukocytosis   CKD (chronic kidney disease), stage III   Left leg DVT (HCC)    Fever Last night, patient spiked a temperature to 102.34F. Blood cultures were sent. Chest x-ray was also obtained which did not show any acute findings. UA is pending. Patient does not have any skin rashes. Does not mention any joint pains. The only other source of infection could be his gallbladder. He does have abnormal LFTs. He is mildly tender in the right upper quadrant and has some nausea. Discussed with Dr. Amedeo Plenty. He recommends consulting general surgery. Discussed with Dr. Brantley Stage who will evaluate the patient. Patient started on Zosyn overnight, which will be continued. WBC is noted to significantly elevated this morning. Tachycardia is secondary to fever.  Transaminitis Patient was noted to have elevated transaminases during his recent hospitalization. Those counts improved. Hepatitis panel is negative. He was seen by GI during that time as well. This was at [facility] was felt that his elevated LFTs were due to an ischemic event. Once again, LFTs are elevated during current hospitalization. Could be due to passive congestion. CT scan of the abdomen pelvis was done which showed gallstones and gallbladder wall thickening. However, he is nontender. Unlikely to be acute cholecystitis. Gastroenterology consulted. There is a cholestatic pattern with elevated alkaline phosphatase and elevated bilirubin. These counts are continuing to improve. Gastroenterology ordered MRCP which was inconclusive. However, patient has become febrile. In the interim and has significant leukocytosis.. See above. General surgery consulted. Coagulation profile was unremarkable. Holding simvastatin. Should continue to be held when he is discharged as we don't have a  clear reason for his transaminitis.  Acute Distal left lower extremity DVT Likely due to recent hospitalization and sedentary status. Initially placed on IV heparin. Transitioned to Eliquis. Will benefit from anticoagulation even though distal veins are involved. More than one vein is involved. No known contraindications. He was started on anticoagulation after discussions with him. If patient is to undergo surgery, Eliquis will have to be discontinued and will have to bridge him  with IV heparin.  Bilateral lower extremity edemaWith gait disturbance Patient's inability to ambulate, appears to have been secondary to his edema rather than a neurological process. No neurological abnormalities noted on examination. Patient's symptoms have improved with the diuresis. He is able to move his legs without any difficulty at this time. Seen by physical therapy and they feel that he will benefit from short-term rehabilitation at skilled nursing facility. Now on oral diuretics. TSH is normal. Echocardiogram does show diastolic dysfunction. Systolic function is normal.   Elevated troponin/abnormal EKG Patient with nonspecific changes on EKG. No acute ischemic changes noted. Patient denies any chest pains. Echocardiogram does not show any wall motion abnormalities. Do not anticipate any further inpatient testing at this time.  Essential hypertension Continue home medications. Monitor blood pressures closely.   Diabetes mellitus type 2 with hyperglycemia Holding his oral agents. SSI. HbA1c is 8.4.  Chronic kidney disease stage 3 Improved. Creatinine improved from previous admission from 4 down to 1.2. Renal function is stable.   Hyponatremia Sudden drop in sodium level compared to yesterday. Repeat tomorrow morning.  Hyperlipidemia Continue to hold simvastatin due to elevated transaminases  Hypoalbuminemia Question of poor nutrition contributing in part to peripheral edema.   Normocytic  anemia. Anemia panel reviewed. No clear deficiencies identified. No evidence for overt bleeding.  DVT Prophylaxis: Now on Eliquis , which may have to be discontinued if surgery is planned. Code Status: Full code  Family Communication: Discussed with the patient  Disposition Plan: Will need short-term rehabilitation at skilled nursing facility when ready for discharge. Not ready currently due to new fever.    LOS: 4 days   [facility] Pager [phone] 10/17/2016, 11:39 AM  If 7PM-7AM, please contact night-coverage at [email or web address], password [facility]

## 2016-10-17 NOTE — Progress Notes (Signed)
On call physician alerted to patient's continued abnormal vital signs.  Heart Rate: 130 Temperature: 102.2 Bp: 155/78 SpO2: 93% on 4 L nasal cannula   Stat Chest X-ray ordered,stat 5 mg Lopressor given per order and stat EKG obtained.

## 2016-10-17 NOTE — Clinical Social Work Placement (Signed)
   CLINICAL SOCIAL WORK PLACEMENT  NOTE  Date:  10/17/2016  Patient Details  Name: Stephen GOLDWASSER Sr. MRN: [id number] Date of Birth: [birth year]  Clinical Social Work is seeking post-discharge placement for this patient at the [place] level of care (*CSW will initial, date and re-position this form in  chart as items are completed):  Yes   Patient/family provided with [place] Work Department's list of facilities offering this level of care within the geographic area requested by the patient (or if unable, by the patient's family).  Yes   Patient/family informed of their freedom to choose among providers that offer the needed level of care, that participate in Medicare, Medicaid or managed care program needed by the patient, have an available bed and are willing to accept the patient.  Yes   Patient/family informed of [facility]'s ownership interest in [facility] and [facility], as well as of the fact that they are under no obligation to receive care at these facilities.  PASRR submitted to EDS on 10/17/16     PASRR number received on 10/17/16     Existing PASRR number confirmed on       FL2 transmitted to all facilities in geographic area requested by pt/family on 10/17/16     FL2 transmitted to all facilities within larger geographic area on 10/17/16     Patient informed that his/her managed care company has contracts with or will negotiate with certain facilities, including the following:            Patient/family informed of bed offers received.  Patient chooses bed at       Physician recommends and patient chooses bed at      Patient to be transferred to   on  .  Patient to be transferred to facility by       Patient family notified on   of transfer.  Name of family member notified:        PHYSICIAN       Additional Comment:    _______________________________________________ Serafina Mitchell, [place] 10/17/2016,  4:23 PM

## 2016-10-17 NOTE — Consult Note (Signed)
Reason for Consult:elevated liver function  Gallbladder disease Referring Physician: Maryland Pink MD   Stephen Bales Sr. is an 75 y.o. male.  HPI: Asked to see patient at the request of Dr Maryland Pink for elevated liver function and gallstones.  Pt admitted for LE edema and being treated for DVT.  Denies abdominal pain nausea or vomiting.  CT shows thickened GB and MRCP equivicol for CBD stone.  Denies food intolerance.    Past Medical History:  Diagnosis Date  . CKD (chronic kidney disease), stage III   . Diabetes mellitus   . DVT (deep venous thrombosis) ([place]) 10/14/2016  . Extremity edema 09/2016  . Hyperlipemia   . Hypertension     Past Surgical History:  Procedure Laterality Date  . COLONOSCOPY      Family History  Problem Relation Age of Onset  . Heart attack Other   . Tuberculosis Other   . Tuberculosis Other   . Diabetes type II Father   . Hypertension Father   . Heart attack Maternal Aunt     Social History:  reports that he has never smoked. He has never used smokeless tobacco. He reports that he does not drink alcohol or use drugs.  Allergies: No Known Allergies  Medications: I have reviewed the patient's current medications.  Results for orders placed or performed during the hospital encounter of 10/12/16 (from the past 48 hour(s))  Glucose, capillary     Status: Abnormal   Collection Time: 10/15/16  4:12 PM  Result Value Ref Range   Glucose-Capillary 248 (H) 65 - 99 mg/dL  Glucose, capillary     Status: Abnormal   Collection Time: 10/15/16  9:48 PM  Result Value Ref Range   Glucose-Capillary 210 (H) 65 - 99 mg/dL  Heparin level (unfractionated)     Status: Abnormal   Collection Time: 10/16/16  5:07 AM  Result Value Ref Range   Heparin Unfractionated 2.01 (H) 0.30 - 0.70 IU/mL    Comment:        IF HEPARIN RESULTS ARE BELOW EXPECTED VALUES, AND PATIENT DOSAGE HAS BEEN CONFIRMED, SUGGEST FOLLOW UP TESTING OF ANTITHROMBIN III LEVELS. RESULTS CONFIRMED BY  MANUAL DILUTION   Comprehensive metabolic panel     Status: Abnormal   Collection Time: 10/16/16  5:07 AM  Result Value Ref Range   Sodium 137 135 - 145 mmol/L   Potassium 4.2 3.5 - 5.1 mmol/L   Chloride 98 (L) 101 - 111 mmol/L   CO2 27 22 - 32 mmol/L   Glucose, Bld 181 (H) 65 - 99 mg/dL   BUN 26 (H) 6 - 20 mg/dL   Creatinine, Ser 1.42 (H) 0.61 - 1.24 mg/dL   Calcium 8.6 (L) 8.9 - 10.3 mg/dL   Total Protein 7.2 6.5 - 8.1 g/dL   Albumin 2.2 (L) 3.5 - 5.0 g/dL   AST 187 (H) 15 - 41 U/L   ALT 241 (H) 17 - 63 U/L   Alkaline Phosphatase 521 (H) 38 - 126 U/L   Total Bilirubin 0.8 0.3 - 1.2 mg/dL   GFR calc non Af Amer 46 (L) >60 mL/min   GFR calc Af Amer 53 (L) >60 mL/min    Comment: (NOTE) The eGFR has been calculated using the CKD EPI equation. This calculation has not been validated in all clinical situations. eGFR's persistently <60 mL/min signify possible Chronic Kidney Disease.    Anion gap 12 5 - 15  Glucose, capillary     Status: Abnormal   Collection Time:  10/16/16  7:30 AM  Result Value Ref Range   Glucose-Capillary 172 (H) 65 - 99 mg/dL   Comment 1 Notify RN    Comment 2 Document in Chart   Glucose, capillary     Status: Abnormal   Collection Time: 10/16/16 12:11 PM  Result Value Ref Range   Glucose-Capillary 184 (H) 65 - 99 mg/dL  Glucose, capillary     Status: Abnormal   Collection Time: 10/16/16  4:26 PM  Result Value Ref Range   Glucose-Capillary 190 (H) 65 - 99 mg/dL   Comment 1 Notify RN    Comment 2 Document in Chart   Glucose, capillary     Status: Abnormal   Collection Time: 10/16/16  8:38 PM  Result Value Ref Range   Glucose-Capillary 184 (H) 65 - 99 mg/dL  Lactic acid, plasma     Status: None   Collection Time: 10/16/16 10:00 PM  Result Value Ref Range   Lactic Acid, Venous 0.9 0.5 - 1.9 mmol/L  CBC with Differential/Platelet     Status: Abnormal   Collection Time: 10/17/16  9:17 AM  Result Value Ref Range   WBC 21.0 (H) 4.0 - 10.5 K/uL   RBC  3.56 (L) 4.22 - 5.81 MIL/uL   Hemoglobin 10.3 (L) 13.0 - 17.0 g/dL   HCT 31.3 (L) 39.0 - 52.0 %   MCV 87.9 78.0 - 100.0 fL   MCH 28.9 26.0 - 34.0 pg   MCHC 32.9 30.0 - 36.0 g/dL   RDW 13.9 11.5 - 15.5 %   Platelets 390 150 - 400 K/uL   Neutrophils Relative % 88 %   Neutro Abs 18.5 (H) 1.7 - 7.7 K/uL   Lymphocytes Relative 6 %   Lymphs Abs 1.3 0.7 - 4.0 K/uL   Monocytes Relative 6 %   Monocytes Absolute 1.3 (H) 0.1 - 1.0 K/uL   Eosinophils Relative 0 %   Eosinophils Absolute 0.0 0.0 - 0.7 K/uL   Basophils Relative 0 %   Basophils Absolute 0.0 0.0 - 0.1 K/uL  Comprehensive metabolic panel     Status: Abnormal   Collection Time: 10/17/16  9:17 AM  Result Value Ref Range   Sodium 128 (L) 135 - 145 mmol/L    Comment: DELTA CHECK NOTED   Potassium 3.6 3.5 - 5.1 mmol/L   Chloride 90 (L) 101 - 111 mmol/L   CO2 27 22 - 32 mmol/L   Glucose, Bld 179 (H) 65 - 99 mg/dL   BUN 18 6 - 20 mg/dL   Creatinine, Ser 1.43 (H) 0.61 - 1.24 mg/dL   Calcium 7.7 (L) 8.9 - 10.3 mg/dL   Total Protein 6.3 (L) 6.5 - 8.1 g/dL   Albumin 1.8 (L) 3.5 - 5.0 g/dL   AST 260 (H) 15 - 41 U/L   ALT 224 (H) 17 - 63 U/L   Alkaline Phosphatase 581 (H) 38 - 126 U/L   Total Bilirubin 2.7 (H) 0.3 - 1.2 mg/dL   GFR calc non Af Amer 46 (L) >60 mL/min   GFR calc Af Amer 53 (L) >60 mL/min    Comment: (NOTE) The eGFR has been calculated using the CKD EPI equation. This calculation has not been validated in all clinical situations. eGFR's persistently <60 mL/min signify possible Chronic Kidney Disease.    Anion gap 11 5 - 15    Mr Abdomen Mrcp Wo Contrast  Result Date: 10/16/2016 CLINICAL DATA:  Patient with history of cholecystitis. Evaluate for choledocholithiasis. EXAM: MRI ABDOMEN WITHOUT CONTRAST  (  INCLUDING MRCP) TECHNIQUE: Multiplanar multisequence MR imaging of the abdomen was performed. Heavily T2-weighted images of the biliary and pancreatic ducts were obtained, and three-dimensional MRCP images were rendered  by post processing. COMPARISON:  CT abdomen pelvis 10/13/2016. FINDINGS: Lower chest: Dependent atelectasis within the lower lobes bilaterally. Hepatobiliary: Liver is normal in size and contour. The gallbladder is distended and contains multiple stones and sludge. There is gallbladder wall thickening and a small amount of pericholecystic fluid. Evaluation of the common bile duct is limited due to motion artifact. Common bile duct is normal in diameter measuring 6 mm without definite intraluminal filling defect identified. No intrahepatic biliary ductal dilatation. Pancreas:  Mildly atrophic. Spleen:  Unremarkable Adrenals/Urinary Tract: The adrenal glands are normal. There is a 2.7 cm cyst within the interpolar region of the left kidney. Small 1.5 cm T2 bright structure within the superior pole of the right kidney, favored to represent a small cyst. Stomach/Bowel: No abnormal bowel wall thickening or evidence for bowel obstruction. Vascular/Lymphatic: Normal caliber abdominal aorta. No retroperitoneal lymphadenopathy. Other:  None. Musculoskeletal: Unremarkable. IMPRESSION: Stones and sludge within the gallbladder lumen. There is gallbladder wall thickening, distention of the gallbladder and mild surrounding fluid, raising the possibility of acute cholecystitis. Evaluation of the common bile duct is limited secondary to motion artifact however the common bile duct is normal in caliber without definite intraluminal filling defect to suggest choledocholithiasis. Electronically Signed   By: Lovey Newcomer M.D.   On: 10/16/2016 20:38   Mr 3d Recon At Scanner  Result Date: 10/16/2016 CLINICAL DATA:  Patient with history of cholecystitis. Evaluate for choledocholithiasis. EXAM: MRI ABDOMEN WITHOUT CONTRAST  (INCLUDING MRCP) TECHNIQUE: Multiplanar multisequence MR imaging of the abdomen was performed. Heavily T2-weighted images of the biliary and pancreatic ducts were obtained, and three-dimensional MRCP images were  rendered by post processing. COMPARISON:  CT abdomen pelvis 10/13/2016. FINDINGS: Lower chest: Dependent atelectasis within the lower lobes bilaterally. Hepatobiliary: Liver is normal in size and contour. The gallbladder is distended and contains multiple stones and sludge. There is gallbladder wall thickening and a small amount of pericholecystic fluid. Evaluation of the common bile duct is limited due to motion artifact. Common bile duct is normal in diameter measuring 6 mm without definite intraluminal filling defect identified. No intrahepatic biliary ductal dilatation. Pancreas:  Mildly atrophic. Spleen:  Unremarkable Adrenals/Urinary Tract: The adrenal glands are normal. There is a 2.7 cm cyst within the interpolar region of the left kidney. Small 1.5 cm T2 bright structure within the superior pole of the right kidney, favored to represent a small cyst. Stomach/Bowel: No abnormal bowel wall thickening or evidence for bowel obstruction. Vascular/Lymphatic: Normal caliber abdominal aorta. No retroperitoneal lymphadenopathy. Other:  None. Musculoskeletal: Unremarkable. IMPRESSION: Stones and sludge within the gallbladder lumen. There is gallbladder wall thickening, distention of the gallbladder and mild surrounding fluid, raising the possibility of acute cholecystitis. Evaluation of the common bile duct is limited secondary to motion artifact however the common bile duct is normal in caliber without definite intraluminal filling defect to suggest choledocholithiasis. Electronically Signed   By: Lovey Newcomer M.D.   On: 10/16/2016 20:38   Dg Chest Port 1 View  Result Date: 10/16/2016 CLINICAL DATA:  Dyspnea EXAM: PORTABLE CHEST 1 VIEW COMPARISON:  10/12/2016 FINDINGS: Shallow inspiration with linear atelectasis in the lung bases. Heart size and pulmonary vascularity are normal. No focal consolidation in the lungs. No blunting of costophrenic angles. No pneumothorax. IMPRESSION: Shallow inspiration with  atelectasis in the lung bases. Electronically  Signed   By: Lucienne Capers M.D.   On: 10/16/2016 22:58    Review of Systems  Constitutional: Positive for fever and malaise/fatigue. Negative for chills.  HENT: Negative for hearing loss.   Eyes: Negative for blurred vision and double vision.  Cardiovascular: Positive for leg swelling. Negative for chest pain.  Gastrointestinal: Positive for abdominal pain, nausea and vomiting.  Genitourinary: Negative for dysuria and urgency.  Musculoskeletal: Negative for myalgias and neck pain.  Skin: Negative for itching and rash.  Neurological: Negative for dizziness and headaches.  Endo/Heme/Allergies: Bruises/bleeds easily.  Psychiatric/Behavioral: Negative for depression and suicidal ideas.   Blood pressure (!) 162/78, pulse (!) 116, temperature (!) 101.2 F (38.4 C), temperature source Oral, resp. rate 16, height '5\' 6"'  (1.676 m), weight 87.9 kg (193 lb 12.6 oz), SpO2 98 %. Physical Exam  Constitutional: He is oriented to person, place, and time. He appears well-developed and well-nourished.  HENT:  Head: Normocephalic and atraumatic.  Eyes: Pupils are equal, round, and reactive to light. No scleral icterus.  Neck: Normal range of motion. Neck supple.  Cardiovascular: Normal rate and regular rhythm.   Respiratory: Effort normal and breath sounds normal.  GI: Soft. There is tenderness in the right upper quadrant.  Musculoskeletal: Normal range of motion.  Neurological: He is alert and oriented to person, place, and time.  Skin: Skin is warm and dry.  Psychiatric: He has a normal mood and affect. His behavior is normal.    Assessment/Plan: Cholelithiasis with possible chronic cholecystitis Paucity of findings in this case  But does have low grade fever Not uncommon for older men to have limited findings Will check U/S and HIDA  OK  To feed for now Would hold anticoagulation for now until work up complete  Priscillia Fouch A. 10/17/2016,  11:49 AM

## 2016-10-17 NOTE — [place] FL2 (Signed)
[place] MEDICAID FL2 LEVEL OF CARE SCREENING TOOL     IDENTIFICATION  Patient Name: Stephen RELLES Sr. Birthdate: [birth year] Sex: male Admission Date (Current Location): 10/12/2016  [facility] and [place] Number:  Herbalist and Address:  [facility] [place], [place] [address], [place], [place] [address]      Provider Number: [id number]  Attending Physician Name and Address:  Bonnielee Haff, MD  Relative Name and Phone Number:  Debby Freiberg [phone]    Current Level of Care: Hospital Recommended Level of Care: Comfort Prior Approval Number:    Date Approved/Denied:   PASRR Number: [id number]  Discharge Plan: SNF    Current Diagnoses: Patient Active Problem List   Diagnosis Date Noted  . At high risk for fluid overload 10/13/2016  . Fluid overload 10/13/2016  . Bilateral lower extremity edema 10/13/2016  . Gait disturbance 10/13/2016  . Leukocytosis 10/13/2016  . CKD (chronic kidney disease), stage III 10/13/2016  . Left leg DVT ([place]) 10/13/2016  . Acute renal failure (ARF) ([place]) 10/08/2016  . Nausea and vomiting 10/08/2016  . Diabetes mellitus ([place]) 10/08/2016  . Transaminitis 10/08/2016  . Hyperlipemia 10/08/2016  . Renal insufficiency   . Dehydration 10/07/2016    Orientation RESPIRATION BLADDER Height & Weight     Self, Time, Situation, Place  Normal Continent Weight: 193 lb 12.6 oz (87.9 kg) Height:  5\' 6"  (167.6 cm)  BEHAVIORAL SYMPTOMS/MOOD NEUROLOGICAL BOWEL NUTRITION STATUS  Other (Comment) (None)   Continent Diet (See DC summary)  AMBULATORY STATUS COMMUNICATION OF NEEDS Skin   Limited Assist Verbally Normal                       Personal Care Assistance Level of Assistance  Bathing Bathing Assistance: Limited assistance         Functional Limitations Info             SPECIAL CARE FACTORS FREQUENCY  PT (By licensed PT), OT (By licensed OT)     PT Frequency: 5x wk OT  Frequency: 5x wk            Contractures Contractures Info: Not present    Additional Factors Info  Code Status, Allergies Code Status Info: Full Allergies Info: No Known Allergies           Current Medications (10/17/2016):  This is the current hospital active medication list Current Facility-Administered Medications  Medication Dose Route Frequency Provider Last Rate Last Dose  . acetaminophen (TYLENOL) tablet 650 mg  650 mg Oral Q4H PRN Ripudeep Krystal Eaton, MD   650 mg at 10/17/16 0823  . albuterol (PROVENTIL) (2.5 MG/3ML) 0.083% nebulizer solution 2.5 mg  2.5 mg Nebulization Q2H PRN Norval Morton, MD      . aspirin EC tablet 81 mg  81 mg Oral Daily Norval Morton, MD   81 mg at 10/17/16 1021  . cloNIDine (CATAPRES) tablet 0.2 mg  0.2 mg Oral Daily Rondell A Tamala Julian, MD   0.2 mg at 10/17/16 1021  . furosemide (LASIX) tablet 40 mg  40 mg Oral Daily Bonnielee Haff, MD   40 mg at 10/17/16 1020  . heparin ADULT infusion 100 units/mL (25000 units/219mL sodium chloride 0.45%)  1,500 Units/hr Intravenous Continuous Bonnielee Haff, MD      . hydrALAZINE (APRESOLINE) tablet 25 mg  25 mg Oral QPM Norval Morton, MD   25 mg at 10/15/16 2137  . insulin aspart (novoLOG) injection  0-5 Units  0-5 Units Subcutaneous QHS Norval Morton, MD   2 Units at 10/15/16 2243  . insulin aspart (novoLOG) injection 0-9 Units  0-9 Units Subcutaneous TID WC Norval Morton, MD   2 Units at 10/17/16 1229  . lisinopril (PRINIVIL,ZESTRIL) tablet 20 mg  20 mg Oral QPM Norval Morton, MD   20 mg at 10/15/16 2138  . ondansetron (ZOFRAN) tablet 4 mg  4 mg Oral Q6H PRN Norval Morton, MD       Or  . ondansetron (ZOFRAN) injection 4 mg  4 mg Intravenous Q6H PRN Norval Morton, MD      . piperacillin-tazobactam (ZOSYN) IVPB 3.375 g  3.375 g Intravenous Q8H Kris Mouton, RPH   3.375 g at 10/17/16 1228  . verapamil (CALAN-SR) CR tablet 240 mg  240 mg Oral QHS Norval Morton, MD   240 mg at 10/15/16 2138      Discharge Medications: Please see discharge summary for a list of discharge medications.  Relevant Imaging Results:  Relevant Lab Results:   Additional Information    Maisee Vollman B, LCSWA

## 2016-10-18 ENCOUNTER — Inpatient Hospital Stay (HOSPITAL_COMMUNITY): Payer: Commercial Managed Care - HMO

## 2016-10-18 LAB — CBC WITH DIFFERENTIAL/PLATELET
BASOS ABS: 0 10*3/uL (ref 0.0–0.1)
BASOS PCT: 0 %
EOS ABS: 0 10*3/uL (ref 0.0–0.7)
Eosinophils Relative: 0 %
HEMATOCRIT: 30.9 % — AB (ref 39.0–52.0)
HEMOGLOBIN: 10.2 g/dL — AB (ref 13.0–17.0)
Lymphocytes Relative: 7 %
Lymphs Abs: 1.5 10*3/uL (ref 0.7–4.0)
MCH: 29.1 pg (ref 26.0–34.0)
MCHC: 33 g/dL (ref 30.0–36.0)
MCV: 88.3 fL (ref 78.0–100.0)
Monocytes Absolute: 1.4 10*3/uL — ABNORMAL HIGH (ref 0.1–1.0)
Monocytes Relative: 7 %
NEUTROS ABS: 18.8 10*3/uL — AB (ref 1.7–7.7)
NEUTROS PCT: 86 %
Platelets: 370 10*3/uL (ref 150–400)
RBC: 3.5 MIL/uL — ABNORMAL LOW (ref 4.22–5.81)
RDW: 14.3 % (ref 11.5–15.5)
WBC: 21.8 10*3/uL — ABNORMAL HIGH (ref 4.0–10.5)

## 2016-10-18 LAB — GLUCOSE, CAPILLARY
GLUCOSE-CAPILLARY: 203 mg/dL — AB (ref 65–99)
Glucose-Capillary: 136 mg/dL — ABNORMAL HIGH (ref 65–99)
Glucose-Capillary: 148 mg/dL — ABNORMAL HIGH (ref 65–99)
Glucose-Capillary: 215 mg/dL — ABNORMAL HIGH (ref 65–99)
Glucose-Capillary: 227 mg/dL — ABNORMAL HIGH (ref 65–99)

## 2016-10-18 LAB — COMPREHENSIVE METABOLIC PANEL
ALK PHOS: 466 U/L — AB (ref 38–126)
ALT: 161 U/L — ABNORMAL HIGH (ref 17–63)
ANION GAP: 12 (ref 5–15)
AST: 117 U/L — ABNORMAL HIGH (ref 15–41)
Albumin: 1.9 g/dL — ABNORMAL LOW (ref 3.5–5.0)
BILIRUBIN TOTAL: 2.1 mg/dL — AB (ref 0.3–1.2)
BUN: 24 mg/dL — ABNORMAL HIGH (ref 6–20)
CALCIUM: 8 mg/dL — AB (ref 8.9–10.3)
CO2: 26 mmol/L (ref 22–32)
CREATININE: 1.81 mg/dL — AB (ref 0.61–1.24)
Chloride: 97 mmol/L — ABNORMAL LOW (ref 101–111)
GFR, EST AFRICAN AMERICAN: 40 mL/min — AB (ref >60)
GFR, EST NON AFRICAN AMERICAN: 34 mL/min — AB (ref >60)
Glucose, Bld: 150 mg/dL — ABNORMAL HIGH (ref 65–99)
Potassium: 3.8 mmol/L (ref 3.5–5.1)
SODIUM: 135 mmol/L (ref 135–145)
TOTAL PROTEIN: 6.3 g/dL — AB (ref 6.5–8.1)

## 2016-10-18 LAB — APTT
APTT: 88 s — AB (ref 24–36)
APTT: 148 s — AB (ref 24–36)
APTT: 106 s — AB (ref 24–36)

## 2016-10-18 LAB — HEPARIN LEVEL (UNFRACTIONATED)

## 2016-10-18 LAB — ANTINUCLEAR ANTIBODIES, IFA: ANA Ab, IFA: NEGATIVE

## 2016-10-18 MED ORDER — VERAPAMIL HCL ER 240 MG PO TBCR
240.0000 mg | EXTENDED_RELEASE_TABLET | Freq: Every day | ORAL | Status: DC
  Administered 2016-10-18 – 2016-10-25 (×8): 240 mg via ORAL
  Filled 2016-10-18 (×10): qty 1

## 2016-10-18 MED ORDER — TECHNETIUM TC 99M MEBROFENIN IV KIT
5.2000 | PACK | Freq: Once | INTRAVENOUS | Status: AC | PRN
  Administered 2016-10-18: 5.2 via INTRAVENOUS

## 2016-10-18 MED ORDER — HEPARIN (PORCINE) IN NACL 100-0.45 UNIT/ML-% IJ SOLN
1350.0000 [IU]/h | INTRAMUSCULAR | Status: DC
  Administered 2016-10-18: 1300 [IU]/h via INTRAVENOUS
  Filled 2016-10-18: qty 250

## 2016-10-18 MED ORDER — MORPHINE SULFATE (PF) 4 MG/ML IV SOLN
3.0000 mg | Freq: Once | INTRAVENOUS | Status: AC
  Administered 2016-10-18: 3 mg via INTRAVENOUS

## 2016-10-18 MED ORDER — MORPHINE SULFATE (PF) 4 MG/ML IV SOLN
INTRAVENOUS | Status: AC
  Filled 2016-10-18: qty 1

## 2016-10-18 NOTE — Clinical Social Work Note (Signed)
Clinical Social Work Assessment  Patient Details  Name: Stephen Rogers. MRN: [id number] Date of Birth: 1941-01-27  Date of referral:  10/18/16               Reason for consult:  Facility Placement, Discharge Planning                Permission sought to share information with:  Family Supports Permission granted to share information::  Yes, Verbal Permission Granted  Name::     Debby Freiberg  Agency::   SNF's  Relationship::  Sister  Contact Information:  [phone]  Housing/Transportation Living arrangements for the past 2 months:  [place] of Information:  Patient, Medical Team, Spouse Patient Interpreter Needed:  None Criminal Activity/Legal Involvement Pertinent to Current Situation/Hospitalization:  No - Comment as needed Significant Relationships:  Adult children, Siblings Lives with:  Adult children Do you feel safe going back to the place where you live?  Yes Need for family participation in patient care:  Yes (Comment)  Care giving concerns:  PT recommending SNF once medically stable for discharge.   Social Worker assessment / plan:  CSW met with patient. No supports at bedside. CSW introduced role and explained that PT recommendations would be discussed. Patient agreeable to SNF placement but wants his sister to make the decision. CSW called and left voicemail with contact information. CSW left list of bed offers with patient to review. No further concerns. CSW encouraged patient to contact CSW as needed. CSW will continue to follow patient for support and facilitate discharge to SNF once medically stable.  Employment status:  Retired Nurse, adult PT Recommendations:  [place] / Referral to community resources:  [place]  Patient/Family's Response to care:  Patient agreeable to SNF placement but wants his sister to choose the facility. Patient's family supportive and involved  in patient's care. Patient appreciated social work intervention.  Patient/Family's Understanding of and Emotional Response to Diagnosis, Current Treatment, and Prognosis:  Patient understands and is agreeable to the discharge plan. Patient appears happy with hospital care.  Emotional Assessment Appearance:  Appears stated age Attitude/Demeanor/Rapport:  Other (Pleasant) Affect (typically observed):  Appropriate, Calm, Pleasant Orientation:  Oriented to Self, Oriented to Place, Oriented to  Time, Oriented to Situation Alcohol / Substance use:  Never Used Psych involvement (Current and /or in the community):  No (Comment)  Discharge Needs  Concerns to be addressed:  Care Coordination Readmission within the last 30 days:  Yes Current discharge risk:  Dependent with Mobility Barriers to Discharge:  Continued Medical Work up   Candie Chroman, LCSW 10/18/2016, 2:03 PM

## 2016-10-18 NOTE — Progress Notes (Signed)
ANTICOAGULATION CONSULT NOTE - Follow Up Consult  Pharmacy Consult for Heparin (holding Apixaban) Indication: DVT  No Known Allergies  Patient Measurements: Height: 5\' 6"  (167.6 cm) Weight: 194 lb 7.1 oz (88.2 kg) IBW/kg (Calculated) : 63.8  Vital Signs: Temp: 100 F (37.8 C) (11/27 1000) Temp Source: Oral (11/27 1000) BP: 116/59 (11/27 1000) Pulse Rate: 95 (11/27 1000)  Labs:  Recent Labs  10/16/16 0507 10/17/16 0917 10/18/16 0544 10/18/16 1123  HGB  --  10.3* 10.2*  --   HCT  --  31.3* 30.9*  --   PLT  --  390 370  --   APTT  --   --  88* 148*  HEPARINUNFRC 2.01*  --  >2.20*  --   CREATININE 1.42* 1.43* 1.81*  --     Estimated Creatinine Clearance: 35.6 mL/min (by C-G formula based on SCr of 1.81 mg/dL (H)).  Assessment: 67 YOM recently admitted to [facility] from 11/16-11/20 with AKI secondary to N/V/D who re-presented to [facility] on 11/22 with lower extremity swelling. The patient was also noted to have elevated LFTs. Dopplers confirmed a LLE DVT and pharmacy was consulted to start anticoagulation. The patient was started on Heparin (11/21-11/24) then transitioned to apixaban (11/24-11/26) and now back to heparin (11/26 - now) due to possible need for surgery.  The patient's aPTT level this afternoon is SUPRAtherapeutic (aPTT 148, goal of 66-102). The heparin drip is running in the patient's left arm and the labs were drawn appropriately from the patient's right arm. The drip was confirmed to be running at the appropriate rate. No overt s/sx of bleeding have been noted. Will hold the drip for 1 hour and restart at a reduced rate.   Goal of Therapy:  Heparin level 0.3-0.7 units/ml aPTT 66-102 seconds Monitor platelets by anticoagulation protocol: Yes   Plan:  1. Hold heparin drip for 1 hour 2. Resume heparin at a rate of 1300 units/hr (13 ml/hr) starting at 1400 today 3. Will continue to monitor for any signs/symptoms of bleeding and will follow up with heparin level in 8  hours after restarting  Thank you for allowing pharmacy to be a part of this patient's care.  Alycia Rossetti, PharmD, BCPS Clinical Pharmacist Pager: [phone] Clinical phone for 10/18/2016 from 7a-3:30p: [phone] If after 3:30p, please call main pharmacy at: [phone] 10/18/2016 12:55 PM

## 2016-10-18 NOTE — Progress Notes (Signed)
Physical Therapy Treatment Patient Details Name: Stephen WELBURN Sr. MRN: [id number] DOB: 11/12/1941 Today's Date: 10/18/2016    History of Present Illness  Stephen MAISTO Sr. is a 75 y.o. male with medical history significant of HTN and DM type II; who presents with complaints of leg swelling and inability to walk.     PT Comments    Pt progressing slowly.  Not moving enough in general. Doesn't self initiate activity, legs are stiff and weak and standing and pivots are extremely effortful.  Follow Up Recommendations  SNF     Equipment Recommendations  None recommended by PT    Recommendations for Other Services       Precautions / Restrictions Restrictions Weight Bearing Restrictions: No    Mobility  Bed Mobility Overal bed mobility: Needs Assistance Bed Mobility: Supine to Sit     Supine to sit: HOB elevated;Supervision     General bed mobility comments: extra time and effort with rail  Transfers Overall transfer level: Needs assistance Equipment used: Rolling walker (2 wheeled) Transfers: Sit to/from [employer] Transfers Sit to Stand: Min assist;From elevated surface (or lower surface with arms) Stand pivot transfers: Min assist       General transfer comment: cues for hand placement, pt making slow, short effortful pivotal steps, needing stability assist to keep him from falling posteriorly.  Ambulation/Gait             General Gait Details: several pivotal steps on his feet then back up to the chair.   Stairs            Wheelchair Mobility    Modified Rankin (Stroke Patients Only)       Balance     Sitting balance-Leahy Scale: Good       Standing balance-Leahy Scale: Poor                      Cognition Arousal/Alertness: Awake/alert   Overall Cognitive Status: Within Functional Limits for tasks assessed                      Exercises      General Comments        Pertinent Vitals/Pain Pain  Assessment: Faces Faces Pain Scale: Hurts a little bit Pain Location: Legs Pain Descriptors / Indicators:  (stiffness) Pain Intervention(s): Monitored during session;Repositioned    Home Living                      Prior Function            PT Goals (current goals can now be found in the care plan section) Acute Rehab PT Goals Patient Stated Goal: home to my daughter's home able to walk. PT Goal Formulation: With patient Time For Goal Achievement: 10/20/16 Potential to Achieve Goals: Fair Progress towards PT goals: Progressing toward goals    Frequency    Min 3X/week      PT Plan Discharge plan needs to be updated    Co-evaluation             End of Session   Activity Tolerance: Patient tolerated treatment well;Other (comment) Patient left: in chair;with call bell/phone within reach     Time: 1331-1358 PT Time Calculation (min) (ACUTE ONLY): 27 min  Charges:  $Therapeutic Activity: 23-37 mins                    G Codes:  Tessie Fass Zaniel Marineau 10/18/2016, 2:07 PM 10/18/2016  Donnella Sham, [place] [phone]  (pager)

## 2016-10-18 NOTE — Progress Notes (Signed)
ANTICOAGULATION CONSULT NOTE  Pharmacy Consult for Heparin Indication: DVT  No Known Allergies  Patient Measurements: Height: 5\' 6"  (167.6 cm) Weight: 174 lb 1.6 oz (79 kg) IBW/kg (Calculated) : 63.8  Vital Signs: Temp: 99.7 F (37.6 C) (11/27 2120) Temp Source: Oral (11/27 2120) BP: 107/65 (11/27 2120) Pulse Rate: 101 (11/27 2120)  Labs:  Recent Labs  10/16/16 0507 10/17/16 0917 10/18/16 0544 10/18/16 1123 10/18/16 2210  HGB  --  10.3* 10.2*  --   --   HCT  --  31.3* 30.9*  --   --   PLT  --  390 370  --   --   APTT  --   --  88* 148* 106*  HEPARINUNFRC 2.01*  --  >2.20*  --   --   CREATININE 1.42* 1.43* 1.81*  --   --     Estimated Creatinine Clearance: 33.8 mL/min (by C-G formula based on SCr of 1.81 mg/dL (H)).  Assessment: 75 y.o. male with DVT, Eliquis on hold for lap chole Wednesday, for heparin.  APTT slightly supratherapeutic tonight   Goal of Therapy:  Heparin level 0.3-0.7 units/ml aPTT 66-102 seconds Monitor platelets by anticoagulation protocol: Yes   Plan:  Expect aPTT to continue decreasing as Eliquis clears, so will not change heparin for now Follow-up am labs, and adjust if aPTT remains above goal.    Phillis Knack, PharmD, BCPS  10/18/2016 11:54 PM

## 2016-10-18 NOTE — Progress Notes (Signed)
ANTICOAGULATION CONSULT NOTE - Follow Up Consult  Pharmacy Consult for Heparin (holding Apixaban) Indication: DVT  No Known Allergies  Patient Measurements: Height: 5\' 6"  (167.6 cm) Weight: 194 lb 7.1 oz (88.2 kg) IBW/kg (Calculated) : 63.8  Vital Signs: Temp: 102.6 F (39.2 C) (11/27 0428) Temp Source: Oral (11/27 0428) BP: 106/54 (11/27 0428) Pulse Rate: 103 (11/27 0428)  Labs:  Recent Labs  10/16/16 0507 10/17/16 0917 10/18/16 0544  HGB  --  10.3* 10.2*  HCT  --  31.3* 30.9*  PLT  --  390 370  APTT  --   --  88*  HEPARINUNFRC 2.01*  --   --   CREATININE 1.42* 1.43* 1.81*    Estimated Creatinine Clearance: 35.6 mL/min (by C-G formula based on SCr of 1.81 mg/dL (H)).  Assessment: 75 y/o F on heparin while apixaban on hold, last dose apixaban 11/26, initial aPTT this AM is therapeutic at 88, using aPTT to dose for now given apixaban influence on anti-Xa levels  Goal of Therapy:  Heparin level 0.3-0.7 units/ml aPTT 66-102 seconds Monitor platelets by anticoagulation protocol: Yes   Plan:  -Cont heparin at 1500 units/hr -1200 aPTT  Narda Bonds 10/18/2016,6:52 AM

## 2016-10-18 NOTE — Care Management Important Message (Signed)
Important Message  Patient Details  Name: Stephen SCHULENBURG Sr. MRN: [id number] Date of Birth: May 25, 1941   Medicare Important Message Given:  Yes    [name] May 10/18/2016, 12:08 PM

## 2016-10-18 NOTE — Progress Notes (Signed)
[facility] Progress Note     Subjective: Does not complain of abd pain  Objective: Vital signs in last 24 hours: Temp:  [99 F (37.2 C)-103 F (39.4 C)] 100 F (37.8 C) (11/27 1000) Pulse Rate:  [95-125] 95 (11/27 1000) Resp:  [16-17] 16 (11/27 1000) BP: (106-154)/(52-75) 116/59 (11/27 1000) SpO2:  [95 %-98 %] 95 % (11/27 1000) Weight:  [194 lb 7.1 oz (88.2 kg)] 194 lb 7.1 oz (88.2 kg) (11/26 2054) Last BM Date: 10/16/16  Intake/Output from previous day: 11/26 0701 - 11/27 0700 In: 290 [P.O.:240; IV Piggyback:50] Out: 1250 [Urine:1250] Intake/Output this shift: No intake/output data recorded.  PE: cv rr pulm clear bilaterally abd tender ruq to deep palpation   Lab Results:   Recent Labs  10/17/16 0917 10/18/16 0544  WBC 21.0* 21.8*  HGB 10.3* 10.2*  HCT 31.3* 30.9*  PLT 390 370   BMET  Recent Labs  10/17/16 0917 10/18/16 0544  NA 128* 135  K 3.6 3.8  CL 90* 97*  CO2 27 26  GLUCOSE 179* 150*  BUN 18 24*  CREATININE 1.43* 1.81*  CALCIUM 7.7* 8.0*   PT/INR No results for input(s): LABPROT, INR in the last 72 hours. CMP     Component Value Date/Time   NA 135 10/18/2016 0544   K 3.8 10/18/2016 0544   CL 97 (L) 10/18/2016 0544   CO2 26 10/18/2016 0544   GLUCOSE 150 (H) 10/18/2016 0544   BUN 24 (H) 10/18/2016 0544   CREATININE 1.81 (H) 10/18/2016 0544   CALCIUM 8.0 (L) 10/18/2016 0544   PROT 6.3 (L) 10/18/2016 0544   ALBUMIN 1.9 (L) 10/18/2016 0544   AST 117 (H) 10/18/2016 0544   ALT 161 (H) 10/18/2016 0544   ALKPHOS 466 (H) 10/18/2016 0544   BILITOT 2.1 (H) 10/18/2016 0544   GFRNONAA 34 (L) 10/18/2016 0544   GFRAA 40 (L) 10/18/2016 0544   Lipase     Component Value Date/Time   LIPASE 36 10/07/2016 2153   Studies/Results: Nm Hepatobiliary Liver Func  Result Date: 10/18/2016 CLINICAL DATA:  Gallstones.  Fever. EXAM: NUCLEAR MEDICINE HEPATOBILIARY IMAGING TECHNIQUE: Sequential images of the abdomen were obtained out to 60  minutes following intravenous administration of radiopharmaceutical. RADIOPHARMACEUTICALS:  5.2 mCi Tc-1m  Choletec IV COMPARISON:  Ultrasound 10/17/2016.  MRI 10/16/2016 FINDINGS: Prompt uptake and biliary excretion of activity by the liver is seen. Large photopenic defect related to distended gallbladder. No filling of the gallbladder. Small bowel activity is identified beginning at 15 minutes. The patient received 3.0 mg IV morphine at 1 hour. Additional scanning for 30 minutes demonstrates no gallbladder activity. IMPRESSION: Markedly distended gallbladder. No gallbladder uptake is seen the 4 after IV morphine administration. Findings consistent with cystic duct obstruction. Electronically Signed   By: Franchot Gallo M.D.   On: 10/18/2016 10:14   Mr Abdomen Mrcp Wo Contrast  Result Date: 10/16/2016 CLINICAL DATA:  Patient with history of cholecystitis. Evaluate for choledocholithiasis. EXAM: MRI ABDOMEN WITHOUT CONTRAST  (INCLUDING MRCP) TECHNIQUE: Multiplanar multisequence MR imaging of the abdomen was performed. Heavily T2-weighted images of the biliary and pancreatic ducts were obtained, and three-dimensional MRCP images were rendered by post processing. COMPARISON:  CT abdomen pelvis 10/13/2016. FINDINGS: Lower chest: Dependent atelectasis within the lower lobes bilaterally. Hepatobiliary: Liver is normal in size and contour. The gallbladder is distended and contains multiple stones and sludge. There is gallbladder wall thickening and a small amount of pericholecystic fluid. Evaluation of the common bile duct is limited  due to motion artifact. Common bile duct is normal in diameter measuring 6 mm without definite intraluminal filling defect identified. No intrahepatic biliary ductal dilatation. Pancreas:  Mildly atrophic. Spleen:  Unremarkable Adrenals/Urinary Tract: The adrenal glands are normal. There is a 2.7 cm cyst within the interpolar region of the left kidney. Small 1.5 cm T2 bright structure  within the superior pole of the right kidney, favored to represent a small cyst. Stomach/Bowel: No abnormal bowel wall thickening or evidence for bowel obstruction. Vascular/Lymphatic: Normal caliber abdominal aorta. No retroperitoneal lymphadenopathy. Other:  None. Musculoskeletal: Unremarkable. IMPRESSION: Stones and sludge within the gallbladder lumen. There is gallbladder wall thickening, distention of the gallbladder and mild surrounding fluid, raising the possibility of acute cholecystitis. Evaluation of the common bile duct is limited secondary to motion artifact however the common bile duct is normal in caliber without definite intraluminal filling defect to suggest choledocholithiasis. Electronically Signed   By: Lovey Newcomer M.D.   On: 10/16/2016 20:38   Mr 3d Recon At Scanner  Result Date: 10/16/2016 CLINICAL DATA:  Patient with history of cholecystitis. Evaluate for choledocholithiasis. EXAM: MRI ABDOMEN WITHOUT CONTRAST  (INCLUDING MRCP) TECHNIQUE: Multiplanar multisequence MR imaging of the abdomen was performed. Heavily T2-weighted images of the biliary and pancreatic ducts were obtained, and three-dimensional MRCP images were rendered by post processing. COMPARISON:  CT abdomen pelvis 10/13/2016. FINDINGS: Lower chest: Dependent atelectasis within the lower lobes bilaterally. Hepatobiliary: Liver is normal in size and contour. The gallbladder is distended and contains multiple stones and sludge. There is gallbladder wall thickening and a small amount of pericholecystic fluid. Evaluation of the common bile duct is limited due to motion artifact. Common bile duct is normal in diameter measuring 6 mm without definite intraluminal filling defect identified. No intrahepatic biliary ductal dilatation. Pancreas:  Mildly atrophic. Spleen:  Unremarkable Adrenals/Urinary Tract: The adrenal glands are normal. There is a 2.7 cm cyst within the interpolar region of the left kidney. Small 1.5 cm T2 bright  structure within the superior pole of the right kidney, favored to represent a small cyst. Stomach/Bowel: No abnormal bowel wall thickening or evidence for bowel obstruction. Vascular/Lymphatic: Normal caliber abdominal aorta. No retroperitoneal lymphadenopathy. Other:  None. Musculoskeletal: Unremarkable. IMPRESSION: Stones and sludge within the gallbladder lumen. There is gallbladder wall thickening, distention of the gallbladder and mild surrounding fluid, raising the possibility of acute cholecystitis. Evaluation of the common bile duct is limited secondary to motion artifact however the common bile duct is normal in caliber without definite intraluminal filling defect to suggest choledocholithiasis. Electronically Signed   By: Lovey Newcomer M.D.   On: 10/16/2016 20:38   Dg Chest Port 1 View  Result Date: 10/16/2016 CLINICAL DATA:  Dyspnea EXAM: PORTABLE CHEST 1 VIEW COMPARISON:  10/12/2016 FINDINGS: Shallow inspiration with linear atelectasis in the lung bases. Heart size and pulmonary vascularity are normal. No focal consolidation in the lungs. No blunting of costophrenic angles. No pneumothorax. IMPRESSION: Shallow inspiration with atelectasis in the lung bases. Electronically Signed   By: Lucienne Capers M.D.   On: 10/16/2016 22:58   US Abdomen Limited Ruq  Result Date: 10/17/2016 CLINICAL DATA:  MRCP showed gallstones. EXAM: US ABDOMEN LIMITED - RIGHT UPPER QUADRANT COMPARISON:  MRCP dated 10/16/2016 FINDINGS: Gallbladder: Multiple gallstones are scattered throughout the gallbladder measuring up to 2.1 cm. There is also sludge filling the rest of the gallbladder. The gallbladder measures 7 mm and wall thickness. Sonographic Murphy's sign absent. Trace pericholecystic fluid is suggested. Common bile duct: Diameter:  5 mm Liver: There is several foci of apparent comet tail artifact or ring down artifact in the liver for example on image 48. No focal liver mass identified. IMPRESSION: 1. Large  gallstones in the gallbladder with sludge and gallbladder wall thickening. Sonographic Murphy's sign absent. Correlate clinically in assessing for acute cholecystitis. 2. Foci of comet tail or ring down artifact in the liver. These can be encountered in the setting of pneumobilia, small cysts, or biliary hamartoma is. Confusing this picture is the absence of hepatic calcifications or obvious biliary hamartoma/cysts or pneumobilia on the prior CT and MRI. Electronically Signed   By: Van Clines M.D.   On: 10/17/2016 17:59    Anti-infectives: Anti-infectives    Start     Dose/Rate Route Frequency Ordered Stop   10/17/16 0400  piperacillin-tazobactam (ZOSYN) IVPB 3.375 g     3.375 g 12.5 mL/hr over 240 Minutes Intravenous Every 8 hours 10/16/16 2122     10/16/16 2200  piperacillin-tazobactam (ZOSYN) IVPB 3.375 g     3.375 g 100 mL/hr over 30 Minutes Intravenous  Once 10/16/16 2122 10/16/16 2226     Assessment/Plan Cholelithiasis with possible chronic cholecystitis  U/S multiple stones, sludge, and wall thickening.  HIDA positive for cholecystitis  MRCP does not suggest choledocholithiasis  LFT's stable/slightly downtrending  WBC 21.8   Dispo: continue to hold [facility] Laparoscopic cholecystectomy with [facility] Wednesday.   LOS: 5 days    [facility] 10/18/2016, 10:34 AM Pager: [phone] Consults: [phone] Mon-Fri 7:00 am-4:30 pm Sat-Sun 7:00 am-11:30 am  Has cholecystitis, will plan for lap chole Wednesday due to eliquis

## 2016-10-18 NOTE — Progress Notes (Signed)
[facility] PROGRESS NOTE  Stephen Rogers. KVQ:[id number] DOB: May 02, 1941 DOA: 10/12/2016  PCP: [name], MD  Brief History/Interval Summary: 75 year old African-American male with a past medical history of hypertension, diabetes, presented to the hospital with complaints of leg swelling and inability to walk. Patient was recently hospitalized to [facility] from November 16 November 20 for acute renal failure. He was given IV fluids. He went home and noted that his legs were swollen. His Lasix was not resumed. He found it difficult to ambulate so he presented to the hospital here. He was hospitalized for further management. Patient diuresed well. He was also found to have acute DVT for which he was placed on anticoagulation. His LFTs were improving, but then he started spiking fever. He was seen by gastroenterology. Subsequently, general surgery was consulted as well for suspected cholecystitis.  Reason for Visit: possible acute cholycystitis  Consultants: Gastroenterology. General surgery  Procedures:   Lower extremity venous Doppler DVT noted in the left peroneal veins and left soleal veins. No DVT RLE. Left baker's cyst noted.  Transthoracic echocardiogram Study Conclusions - Left ventricle: The cavity size was normal. Wall thickness was   increased in a pattern of mild LVH. Systolic function was   vigorous. The estimated ejection fraction was in the range of 65%   to 70%. Wall motion was normal; there were no regional wall   motion abnormalities. Doppler parameters are consistent with   abnormal left ventricular relaxation (grade 1 diastolic   dysfunction). - Aortic valve: Mildly calcified annulus. Trileaflet. - Mitral valve: There was trivial regurgitation. - Left atrium: The atrium was mildly dilated. - Right atrium: Central venous pressure (est): 3 mm Hg. - Atrial septum: No defect or patent foramen ovale was identified. - Tricuspid valve: There was  mild regurgitation. - Pulmonary arteries: PA peak pressure: 35 mm Hg (S). - Pericardium, extracardiac: There was no pericardial effusion.  Impressions: - Mild LVH with sigmoid-shaped basal septum and LVEF 65-70%. Grade   1 diastolic dysfunction. Mild left atrial enlargement. Trivial   mitral regurgitation. Mild calcified aortic annulus. Mild   tricuspid regurgitation with PASP 35 mmHg.  Antibiotics: Zosyn 11/25  Subjective/Interval History: No complaints since yesterday, denies any fever or chills.  ROS: Denies any cough, diarrhea, or dysuria, joint pains, skin rashes.  Objective:  Vital Signs  Vitals:   10/17/16 2054 10/17/16 2348 10/18/16 0428 10/18/16 1000  BP: (!) 109/52  (!) 106/54 (!) 116/59  Pulse: (!) 125  (!) 103 95  Resp: '17  16 16  ' Temp: (!) 103 F (39.4 C) 99 F (37.2 C) (!) 102.6 F (39.2 C) 100 F (37.8 C)  TempSrc: Oral Oral Oral Oral  SpO2: 96%  95% 95%  Weight: 88.2 kg (194 lb 7.1 oz)     Height:        Intake/Output Summary (Last 24 hours) at 10/18/16 1425 Last data filed at 10/18/16 0900  Gross per 24 hour  Intake               50 ml  Output              750 ml  Net             -700 ml   Filed Weights   10/15/16 2133 10/16/16 2039 10/17/16 2054  Weight: 87.9 kg (193 lb 11.2 oz) 87.9 kg (193 lb 12.6 oz) 88.2 kg (194 lb 7.1 oz)    General appearance: alert, cooperative, appears stated age  and no distress Resp: clear to auscultation bilaterally Cardio: S1 S2 is tachycardic this morning. No S3, S4. No rubs, murmurs or bruit. Minimal pedal edema.  GI: Abdomen is soft. Patient did experience some tenderness this morning in the right upper quadrant on palpation. No rebound, rigidity or guarding. Percell Miller sign is equivocal. Bowel sounds are present. No masses or organomegaly. Extremities: improved edema bilateral lower extremities.  Neurologic: Awake and alert. Oriented 3. Cranial nerves II-12 intact. Motor strength equal bilateral upper and lower  extremities    Lab Results:  Data Reviewed: I have personally reviewed following labs and imaging studies  CBC:  Recent Labs Lab 10/12/16 1707 10/13/16 0309 10/14/16 0829 10/15/16 0335 10/17/16 0917 10/18/16 0544  WBC 11.9* 12.9* 9.2 11.1* 21.0* 21.8*  NEUTROABS 9.6*  --   --   --  18.5* 18.8*  HGB 11.6* 12.4* 10.8* 10.8* 10.3* 10.2*  HCT 35.2* 36.8* 33.0* 33.1* 31.3* 30.9*  MCV 87.3 87.4 87.1 88.3 87.9 88.3  PLT 370 391 402* 374 390 161    Basic Metabolic Panel:  Recent Labs Lab 10/14/16 0829 10/15/16 0335 10/16/16 0507 10/17/16 0917 10/18/16 0544  NA 136 136 137 128* 135  K 3.9 4.2 4.2 3.6 3.8  CL 100* 99* 98* 90* 97*  CO2 '27 26 27 27 26  ' GLUCOSE 164* 144* 181* 179* 150*  BUN 12 18 26* 18 24*  CREATININE 1.32* 1.36* 1.42* 1.43* 1.81*  CALCIUM 8.3* 8.0* 8.6* 7.7* 8.0*    GFR: Estimated Creatinine Clearance: 35.6 mL/min (by C-G formula based on SCr of 1.81 mg/dL (H)).  Liver Function Tests:  Recent Labs Lab 10/14/16 0829 10/15/16 0335 10/16/16 0507 10/17/16 0917 10/18/16 0544  AST 285* 124* 187* 260* 117*  ALT 365* 255* 241* 224* 161*  ALKPHOS 466* 388* 521* 581* 466*  BILITOT 1.4* 0.7 0.8 2.7* 2.1*  PROT 6.3* 6.2* 7.2 6.3* 6.3*  ALBUMIN 1.9* 2.0* 2.2* 1.8* 1.9*    No results for input(s): LIPASE, AMYLASE in the last 168 hours. Coagulation Profile:  Recent Labs Lab 10/13/16 1414  INR 1.16    Cardiac Enzymes:  Recent Labs Lab 10/12/16 1735 10/13/16 0309 10/13/16 0755 10/13/16 1414  TROPONINI 0.03* 0.05* 0.06* 0.05*    CBG:  Recent Labs Lab 10/17/16 1152 10/17/16 1651 10/17/16 2051 10/18/16 0958 10/18/16 1130  GLUCAP 158* 151* 145* 136* 148*     Thyroid Function Tests: No results for input(s): TSH, T4TOTAL, FREET4, T3FREE, THYROIDAB in the last 72 hours.   Recent Results (from the past 240 hour(s))  Culture, blood (routine x 2)     Status: None   Collection Time: 10/09/16  4:25 PM  Result Value Ref Range Status    Specimen Description BLOOD RIGHT HAND  Final   Special Requests BOTTLES DRAWN AEROBIC AND ANAEROBIC 6CC  Final   Culture NO GROWTH 5 DAYS  Final   Report Status 10/14/2016 FINAL  Final  Culture, blood (routine x 2)     Status: None   Collection Time: 10/09/16  4:36 PM  Result Value Ref Range Status   Specimen Description BLOOD LEFT HAND  Final   Special Requests BOTTLES DRAWN AEROBIC AND ANAEROBIC  Final   Culture NO GROWTH 5 DAYS  Final   Report Status 10/14/2016 FINAL  Final  Culture, blood (routine x 2)     Status: None (Preliminary result)   Collection Time: 10/16/16 10:16 PM  Result Value Ref Range Status   Specimen Description BLOOD LEFT HAND  Final  Special Requests BOTTLES DRAWN AEROBIC AND ANAEROBIC 5ML  Final   Culture NO GROWTH 1 DAY  Final   Report Status PENDING  Incomplete  Culture, blood (routine x 2)     Status: Abnormal (Preliminary result)   Collection Time: 10/16/16 10:25 PM  Result Value Ref Range Status   Specimen Description BLOOD RIGHT HAND  Final   Special Requests IN PEDIATRIC BOTTLE 3ML  Final   Culture  Setup Time   Final    GRAM NEGATIVE RODS IN PEDIATRIC BOTTLE CRITICAL RESULT CALLED TO, READ BACK BY AND VERIFIED WITH: C BALL,PHARMD AT 1405 10/17/16 BY L BENFIELD    Culture ESCHERICHIA COLI (A)  Final   Report Status PENDING  Incomplete  Blood Culture ID Panel (Reflexed)     Status: Abnormal   Collection Time: 10/16/16 10:25 PM  Result Value Ref Range Status   Enterococcus species NOT DETECTED NOT DETECTED Final   Listeria monocytogenes NOT DETECTED NOT DETECTED Final   Staphylococcus species NOT DETECTED NOT DETECTED Final   Staphylococcus aureus NOT DETECTED NOT DETECTED Final   Streptococcus species NOT DETECTED NOT DETECTED Final   Streptococcus agalactiae NOT DETECTED NOT DETECTED Final   Streptococcus pneumoniae NOT DETECTED NOT DETECTED Final   Streptococcus pyogenes NOT DETECTED NOT DETECTED Final   Acinetobacter baumannii NOT DETECTED  NOT DETECTED Final   Enterobacteriaceae species DETECTED (A) NOT DETECTED Final    Comment: CRITICAL RESULT CALLED TO, READ BACK BY AND VERIFIED WITH: C BALL,PHARMD AT 1405 10/17/16 BY L BENFIELD    Enterobacter cloacae complex NOT DETECTED NOT DETECTED Final   Escherichia coli DETECTED (A) NOT DETECTED Final    Comment: CRITICAL RESULT CALLED TO, READ BACK BY AND VERIFIED WITH: C BALL,PHARMD AT 1405 10/17/16 BY L BENFIELD    Klebsiella oxytoca NOT DETECTED NOT DETECTED Final   Klebsiella pneumoniae NOT DETECTED NOT DETECTED Final   Proteus species NOT DETECTED NOT DETECTED Final   Serratia marcescens NOT DETECTED NOT DETECTED Final   Carbapenem resistance NOT DETECTED NOT DETECTED Final   Haemophilus influenzae NOT DETECTED NOT DETECTED Final   Neisseria meningitidis NOT DETECTED NOT DETECTED Final   Pseudomonas aeruginosa NOT DETECTED NOT DETECTED Final   Candida albicans NOT DETECTED NOT DETECTED Final   Candida glabrata NOT DETECTED NOT DETECTED Final   Candida krusei NOT DETECTED NOT DETECTED Final   Candida parapsilosis NOT DETECTED NOT DETECTED Final   Candida tropicalis NOT DETECTED NOT DETECTED Final      Radiology Studies: Nm Hepatobiliary Liver Func  Result Date: 10/18/2016 CLINICAL DATA:  Gallstones.  Fever. EXAM: NUCLEAR MEDICINE HEPATOBILIARY IMAGING TECHNIQUE: Sequential images of the abdomen were obtained out to 60 minutes following intravenous administration of radiopharmaceutical. RADIOPHARMACEUTICALS:  5.2 mCi Tc-32m Choletec IV COMPARISON:  Ultrasound 10/17/2016.  MRI 10/16/2016 FINDINGS: Prompt uptake and biliary excretion of activity by the liver is seen. Large photopenic defect related to distended gallbladder. No filling of the gallbladder. Small bowel activity is identified beginning at 15 minutes. The patient received 3.0 mg IV morphine at 1 hour. Additional scanning for 30 minutes demonstrates no gallbladder activity. IMPRESSION: Markedly distended  gallbladder. No gallbladder uptake is seen the 4 after IV morphine administration. Findings consistent with cystic duct obstruction. Electronically Signed   By: CFranchot GalloM.D.   On: 10/18/2016 10:14   Mr Abdomen Mrcp Wo Contrast  Result Date: 10/16/2016 CLINICAL DATA:  Patient with history of cholecystitis. Evaluate for choledocholithiasis. EXAM: MRI ABDOMEN WITHOUT CONTRAST  (INCLUDING MRCP) TECHNIQUE:  Multiplanar multisequence MR imaging of the abdomen was performed. Heavily T2-weighted images of the biliary and pancreatic ducts were obtained, and three-dimensional MRCP images were rendered by post processing. COMPARISON:  CT abdomen pelvis 10/13/2016. FINDINGS: Lower chest: Dependent atelectasis within the lower lobes bilaterally. Hepatobiliary: Liver is normal in size and contour. The gallbladder is distended and contains multiple stones and sludge. There is gallbladder wall thickening and a small amount of pericholecystic fluid. Evaluation of the common bile duct is limited due to motion artifact. Common bile duct is normal in diameter measuring 6 mm without definite intraluminal filling defect identified. No intrahepatic biliary ductal dilatation. Pancreas:  Mildly atrophic. Spleen:  Unremarkable Adrenals/Urinary Tract: The adrenal glands are normal. There is a 2.7 cm cyst within the interpolar region of the left kidney. Small 1.5 cm T2 bright structure within the superior pole of the right kidney, favored to represent a small cyst. Stomach/Bowel: No abnormal bowel wall thickening or evidence for bowel obstruction. Vascular/Lymphatic: Normal caliber abdominal aorta. No retroperitoneal lymphadenopathy. Other:  None. Musculoskeletal: Unremarkable. IMPRESSION: Stones and sludge within the gallbladder lumen. There is gallbladder wall thickening, distention of the gallbladder and mild surrounding fluid, raising the possibility of acute cholecystitis. Evaluation of the common bile duct is limited secondary  to motion artifact however the common bile duct is normal in caliber without definite intraluminal filling defect to suggest choledocholithiasis. Electronically Signed   By: Lovey Newcomer M.D.   On: 10/16/2016 20:38   Mr 3d Recon At Scanner  Result Date: 10/16/2016 CLINICAL DATA:  Patient with history of cholecystitis. Evaluate for choledocholithiasis. EXAM: MRI ABDOMEN WITHOUT CONTRAST  (INCLUDING MRCP) TECHNIQUE: Multiplanar multisequence MR imaging of the abdomen was performed. Heavily T2-weighted images of the biliary and pancreatic ducts were obtained, and three-dimensional MRCP images were rendered by post processing. COMPARISON:  CT abdomen pelvis 10/13/2016. FINDINGS: Lower chest: Dependent atelectasis within the lower lobes bilaterally. Hepatobiliary: Liver is normal in size and contour. The gallbladder is distended and contains multiple stones and sludge. There is gallbladder wall thickening and a small amount of pericholecystic fluid. Evaluation of the common bile duct is limited due to motion artifact. Common bile duct is normal in diameter measuring 6 mm without definite intraluminal filling defect identified. No intrahepatic biliary ductal dilatation. Pancreas:  Mildly atrophic. Spleen:  Unremarkable Adrenals/Urinary Tract: The adrenal glands are normal. There is a 2.7 cm cyst within the interpolar region of the left kidney. Small 1.5 cm T2 bright structure within the superior pole of the right kidney, favored to represent a small cyst. Stomach/Bowel: No abnormal bowel wall thickening or evidence for bowel obstruction. Vascular/Lymphatic: Normal caliber abdominal aorta. No retroperitoneal lymphadenopathy. Other:  None. Musculoskeletal: Unremarkable. IMPRESSION: Stones and sludge within the gallbladder lumen. There is gallbladder wall thickening, distention of the gallbladder and mild surrounding fluid, raising the possibility of acute cholecystitis. Evaluation of the common bile duct is limited  secondary to motion artifact however the common bile duct is normal in caliber without definite intraluminal filling defect to suggest choledocholithiasis. Electronically Signed   By: Lovey Newcomer M.D.   On: 10/16/2016 20:38   Dg Chest Port 1 View  Result Date: 10/16/2016 CLINICAL DATA:  Dyspnea EXAM: PORTABLE CHEST 1 VIEW COMPARISON:  10/12/2016 FINDINGS: Shallow inspiration with linear atelectasis in the lung bases. Heart size and pulmonary vascularity are normal. No focal consolidation in the lungs. No blunting of costophrenic angles. No pneumothorax. IMPRESSION: Shallow inspiration with atelectasis in the lung bases. Electronically Signed   By:  Lucienne Capers M.D.   On: 10/16/2016 22:58   US Abdomen Limited Ruq  Result Date: 10/17/2016 CLINICAL DATA:  MRCP showed gallstones. EXAM: US ABDOMEN LIMITED - RIGHT UPPER QUADRANT COMPARISON:  MRCP dated 10/16/2016 FINDINGS: Gallbladder: Multiple gallstones are scattered throughout the gallbladder measuring up to 2.1 cm. There is also sludge filling the rest of the gallbladder. The gallbladder measures 7 mm and wall thickness. Sonographic Murphy's sign absent. Trace pericholecystic fluid is suggested. Common bile duct: Diameter: 5 mm Liver: There is several foci of apparent comet tail artifact or ring down artifact in the liver for example on image 48. No focal liver mass identified. IMPRESSION: 1. Large gallstones in the gallbladder with sludge and gallbladder wall thickening. Sonographic Murphy's sign absent. Correlate clinically in assessing for acute cholecystitis. 2. Foci of comet tail or ring down artifact in the liver. These can be encountered in the setting of pneumobilia, small cysts, or biliary hamartoma is. Confusing this picture is the absence of hepatic calcifications or obvious biliary hamartoma/cysts or pneumobilia on the prior CT and MRI. Electronically Signed   By: Van Clines M.D.   On: 10/17/2016 17:59     Medications:    Scheduled: . aspirin EC  81 mg Oral Daily  . furosemide  40 mg Oral Daily  . hydrALAZINE  25 mg Oral QPM  . insulin aspart  0-5 Units Subcutaneous QHS  . insulin aspart  0-9 Units Subcutaneous TID WC  . lisinopril  20 mg Oral QPM  . morphine      . piperacillin-tazobactam (ZOSYN)  IV  3.375 g Intravenous Q8H  . verapamil  240 mg Oral QHS   Continuous: . heparin     GQQ:[id number], albuterol, ondansetron **OR** ondansetron (ZOFRAN) IV  Assessment/Plan:  Principal Problem:   Bilateral lower extremity edema Active Problems:   Transaminitis   Hyperlipemia   At high risk for fluid overload   Fluid overload   Gait disturbance   Leukocytosis   CKD (chronic kidney disease), stage III   Left leg DVT (HCC)    Escherichia coli bacteremia and early sepsis -Patient spiked temperature of 103 while he is in the hospital, blood cultures showed Escherichia coli. -Met sepsis criteria with temperature of 103, heart rate of 115. -No evidence of hypoperfusion, normal lactic acid. -Treated with IV fluids and antibiotics, sepsis physiology resolved.  Acute cholecystitis -Fever, transaminitis and jaundice, started on Zosyn. -Abdominal ultrasound showed multiple gallstones. -HIDA scan showing cystic duct obstruction, patient for cholecystectomy on Wednesday  Transaminitis -Likely secondary to acute cholecystitis  Acute Distal left lower extremity DVT -Likely due to recent hospitalization and sedentary status. Initially placed on IV heparin. Transitioned to Eliquis. -Currently Eliquis discontinued, on IV heparin prior to the procedure.  Bilateral lower extremity edema With gait disturbance Patient's inability to ambulate, appears to have been secondary to his edema rather than a neurological process. No neurological abnormalities noted on examination. Patient's symptoms have improved with the diuresis. He is able to move his legs without any difficulty at this time. Seen by physical  therapy and they feel that he will benefit from short-term rehabilitation at skilled nursing facility. Now on oral diuretics. TSH is normal. Echocardiogram does show diastolic dysfunction. Systolic function is normal.  PT recommended skilled nursing facility.  Elevated troponin/abnormal EKG Patient with nonspecific changes on EKG. No acute ischemic changes noted. Patient denies any chest pains. Echocardiogram does not show any wall motion abnormalities. Do not anticipate any further inpatient testing at this time.  Essential hypertension Continue home medications. Monitor blood pressures closely.   Diabetes mellitus type 2 with hyperglycemia Holding his oral agents. SSI. HbA1c is 8.4.  Chronic kidney disease stage 3 Improved. Creatinine improved from previous admission from 4 down to 1.2. Renal function is stable.   Hyponatremia Sudden drop in sodium level compared to yesterday. Repeat tomorrow morning.  Hyperlipidemia Continue to hold simvastatin due to elevated transaminases  Hypoalbuminemia Question of poor nutrition contributing in part to peripheral edema.   Normocytic anemia. Anemia panel reviewed. No clear deficiencies identified. No evidence for overt bleeding.  DVT Prophylaxis: IV heparin Code Status: Full code  Family Communication: Discussed with the patient  Disposition Plan: Needs SNF    LOS: 5 days   Birdie Hopes, MD [facility] Pager [phone] 10/18/2016, 2:25 PM  If 7PM-7AM, please contact night-coverage at [email or web address], password [facility]

## 2016-10-19 ENCOUNTER — Inpatient Hospital Stay (HOSPITAL_COMMUNITY): Payer: Commercial Managed Care - HMO

## 2016-10-19 ENCOUNTER — Encounter (HOSPITAL_COMMUNITY): Payer: Self-pay | Admitting: Interventional Radiology

## 2016-10-19 HISTORY — PX: IR GENERIC HISTORICAL: IMG1180011

## 2016-10-19 LAB — GLUCOSE, CAPILLARY
GLUCOSE-CAPILLARY: 159 mg/dL — AB (ref 65–99)
GLUCOSE-CAPILLARY: 140 mg/dL — AB (ref 65–99)
Glucose-Capillary: 138 mg/dL — ABNORMAL HIGH (ref 65–99)
Glucose-Capillary: 153 mg/dL — ABNORMAL HIGH (ref 65–99)

## 2016-10-19 LAB — CULTURE, BLOOD (ROUTINE X 2)

## 2016-10-19 LAB — CBC
HCT: 28.8 % — ABNORMAL LOW (ref 39.0–52.0)
Hemoglobin: 9.2 g/dL — ABNORMAL LOW (ref 13.0–17.0)
MCH: 28.4 pg (ref 26.0–34.0)
MCHC: 31.9 g/dL (ref 30.0–36.0)
MCV: 88.9 fL (ref 78.0–100.0)
Platelets: 354 10*3/uL (ref 150–400)
RBC: 3.24 MIL/uL — ABNORMAL LOW (ref 4.22–5.81)
RDW: 14.7 % (ref 11.5–15.5)
WBC: 17.2 10*3/uL — ABNORMAL HIGH (ref 4.0–10.5)

## 2016-10-19 LAB — HEPARIN LEVEL (UNFRACTIONATED)

## 2016-10-19 LAB — APTT: APTT: 67 s — AB (ref 24–36)

## 2016-10-19 MED ORDER — MIDAZOLAM HCL 2 MG/2ML IJ SOLN
INTRAMUSCULAR | Status: AC
  Filled 2016-10-19: qty 2

## 2016-10-19 MED ORDER — FENTANYL CITRATE (PF) 100 MCG/2ML IJ SOLN
INTRAMUSCULAR | Status: AC | PRN
  Administered 2016-10-19 (×2): 50 ug via INTRAVENOUS

## 2016-10-19 MED ORDER — LIDOCAINE HCL 1 % IJ SOLN
INTRAMUSCULAR | Status: AC
  Filled 2016-10-19: qty 20

## 2016-10-19 MED ORDER — FENTANYL CITRATE (PF) 100 MCG/2ML IJ SOLN
INTRAMUSCULAR | Status: AC
  Filled 2016-10-19: qty 2

## 2016-10-19 MED ORDER — MIDAZOLAM HCL 2 MG/2ML IJ SOLN
INTRAMUSCULAR | Status: AC | PRN
  Administered 2016-10-19 (×2): 1 mg via INTRAVENOUS

## 2016-10-19 MED ORDER — SODIUM CHLORIDE 0.9 % IV BOLUS (SEPSIS)
500.0000 mL | Freq: Once | INTRAVENOUS | Status: AC
  Administered 2016-10-19: 500 mL via INTRAVENOUS

## 2016-10-19 MED ORDER — HEPARIN (PORCINE) IN NACL 100-0.45 UNIT/ML-% IJ SOLN
1850.0000 [IU]/h | INTRAMUSCULAR | Status: DC
  Administered 2016-10-19: 1350 [IU]/h via INTRAVENOUS
  Administered 2016-10-20: 1500 [IU]/h via INTRAVENOUS
  Administered 2016-10-21: 1600 [IU]/h via INTRAVENOUS
  Administered 2016-10-21: 1500 [IU]/h via INTRAVENOUS
  Administered 2016-10-22 – 2016-10-24 (×3): 1700 [IU]/h via INTRAVENOUS
  Filled 2016-10-19 (×9): qty 250

## 2016-10-19 MED ORDER — SODIUM CHLORIDE 0.9 % IV SOLN: 1.0000 g | INTRAVENOUS | Status: DC

## 2016-10-19 MED ORDER — LIDOCAINE HCL 1 % IJ SOLN
INTRAMUSCULAR | Status: AC | PRN
  Administered 2016-10-19: 5 mL

## 2016-10-19 MED ORDER — IOPAMIDOL (ISOVUE-300) INJECTION 61%
INTRAVENOUS | Status: AC
  Administered 2016-10-19: 23 mL
  Filled 2016-10-19: qty 50

## 2016-10-19 NOTE — Progress Notes (Signed)
[facility] Progress Note     Subjective: Denies abdominal pain. One episode of non-bloody, post-prandial emesis yesterday. Having BMs that are soft and brown. Denies chills/night sweats.  Febrile this AM at 101.1  Objective: Vital signs in last 24 hours: Temp:  [99.7 F (37.6 C)-100.2 F (37.9 C)] 99.8 F (37.7 C) (11/28 0526) Pulse Rate:  [95-106] 98 (11/28 0526) Resp:  [16-17] 17 (11/28 0526) BP: (102-116)/(59-65) 109/59 (11/28 0526) SpO2:  [95 %-99 %] 98 % (11/28 0526) Weight:  [174 lb 1.6 oz (79 kg)] 174 lb 1.6 oz (79 kg) (11/27 2120) Last BM Date:  ("Pt. is unsure")  Intake/Output from previous day: 11/27 0701 - 11/28 0700 In: 639.5 [P.O.:540; I.V.:99.5] Out: 450 [Urine:450] Intake/Output this shift: Total I/O In: 240 [P.O.:240] Out: -   PE: Gen:  Alert, NAD, pleasant Pulm:  CTA, no W/R/R Abd: Soft, NT/ND, +BS Recent Labs  10/18/16 0544 10/19/16 0405  WBC 21.8* 17.2*  HGB 10.2* 9.2*  HCT 30.9* 28.8*  PLT 370 354   BMET  Recent Labs  10/17/16 0917 10/18/16 0544  NA 128* 135  K 3.6 3.8  CL 90* 97*  CO2 27 26  GLUCOSE 179* 150*  BUN 18 24*  CREATININE 1.43* 1.81*  CALCIUM 7.7* 8.0*   CMP     Component Value Date/Time   NA 135 10/18/2016 0544   K 3.8 10/18/2016 0544   CL 97 (L) 10/18/2016 0544   CO2 26 10/18/2016 0544   GLUCOSE 150 (H) 10/18/2016 0544   BUN 24 (H) 10/18/2016 0544   CREATININE 1.81 (H) 10/18/2016 0544   CALCIUM 8.0 (L) 10/18/2016 0544   PROT 6.3 (L) 10/18/2016 0544   ALBUMIN 1.9 (L) 10/18/2016 0544   AST 117 (H) 10/18/2016 0544   ALT 161 (H) 10/18/2016 0544   ALKPHOS 466 (H) 10/18/2016 0544   BILITOT 2.1 (H) 10/18/2016 0544   GFRNONAA 34 (L) 10/18/2016 0544   GFRAA 40 (L) 10/18/2016 0544   Lipase     Component Value Date/Time   LIPASE 36 10/07/2016 2153   Studies/Results: Nm Hepatobiliary Liver Func  Result Date: 10/18/2016 CLINICAL DATA:  Gallstones.  Fever. EXAM: NUCLEAR MEDICINE HEPATOBILIARY  IMAGING TECHNIQUE: Sequential images of the abdomen were obtained out to 60 minutes following intravenous administration of radiopharmaceutical. RADIOPHARMACEUTICALS:  5.2 mCi Tc-102m  Choletec IV COMPARISON:  Ultrasound 10/17/2016.  MRI 10/16/2016 FINDINGS: Prompt uptake and biliary excretion of activity by the liver is seen. Large photopenic defect related to distended gallbladder. No filling of the gallbladder. Small bowel activity is identified beginning at 15 minutes. The patient received 3.0 mg IV morphine at 1 hour. Additional scanning for 30 minutes demonstrates no gallbladder activity. IMPRESSION: Markedly distended gallbladder. No gallbladder uptake is seen the 4 after IV morphine administration. Findings consistent with cystic duct obstruction. Electronically Signed   By: Franchot Gallo M.D.   On: 10/18/2016 10:14   US Abdomen Limited Ruq  Result Date: 10/17/2016 CLINICAL DATA:  MRCP showed gallstones. EXAM: US ABDOMEN LIMITED - RIGHT UPPER QUADRANT COMPARISON:  MRCP dated 10/16/2016 FINDINGS: Gallbladder: Multiple gallstones are scattered throughout the gallbladder measuring up to 2.1 cm. There is also sludge filling the rest of the gallbladder. The gallbladder measures 7 mm and wall thickness. Sonographic Murphy's sign absent. Trace pericholecystic fluid is suggested. Common bile duct: Diameter: 5 mm Liver: There is several foci of apparent comet tail artifact or ring down artifact in the liver for example on image 48. No focal liver mass identified.  IMPRESSION: 1. Large gallstones in the gallbladder with sludge and gallbladder wall thickening. Sonographic Murphy's sign absent. Correlate clinically in assessing for acute cholecystitis. 2. Foci of comet tail or ring down artifact in the liver. These can be encountered in the setting of pneumobilia, small cysts, or biliary hamartoma is. Confusing this picture is the absence of hepatic calcifications or obvious biliary hamartoma/cysts or pneumobilia on  the prior CT and MRI. Electronically Signed   By: Van Clines M.D.   On: 10/17/2016 17:59    Anti-infectives: Anti-infectives    Start     Dose/Rate Route Frequency Ordered Stop   10/17/16 0400  piperacillin-tazobactam (ZOSYN) IVPB 3.375 g     3.375 g 12.5 mL/hr over 240 Minutes Intravenous Every 8 hours 10/16/16 2122     10/16/16 2200  piperacillin-tazobactam (ZOSYN) IVPB 3.375 g     3.375 g 100 mL/hr over 30 Minutes Intravenous  Once 10/16/16 2122 10/16/16 2226     Assessment/Plan Cholelithiasis with possible chronic cholecystitis             U/S multiple stones, sludge, and wall thickening.             HIDA positive for cholecystitis             MRCP does not suggest choledocholithiasis             LFT's stable/slightly downtrending             WBC 17.2 from 21  Dispo: continue to hold Eloquis, heparin drip ID: Zosyn Laparoscopic cholecystectomy with IOC tomorrow by Dr. Donne Hazel AM labs    LOS: 6 days    Stephen Rogers , [facility] 10/19/2016, 9:05 AM Pager: [phone] Consults: [phone] Mon-Fri 7:00 am-4:30 pm Sat-Sun 7:00 am-11:30 am

## 2016-10-19 NOTE — Consult Note (Signed)
Chief Complaint: Patient was seen in consultation today for percutaneous cholescystostomy   Referring Physician(s): Dr. Rolm Bookbinder  Supervising Physician: [name]  Patient Status: [facility] - In-pt  History of Present Illness: Stephen MIZZELL Sr. is a 75 y.o. male with past medical history of hypertension, diabetes, and transaminitis followed by GI who presents to the hospital for leg swelling and inability to walk.  Patient was dx with acute DVT and was placed on continuous heparin drip for anticoagulation.  During hospitalization, his LFTs were noted to, once again, be elevated.  CT of the abdomen pelvic showed gall stones and gallbladder thickening.  MRCP performed by GI was inconclusive after which patient developed a fever.  Surgery originally scheduled for Wednesday however canceled due to current comorbidities and current medical state.  IR consulted for percutaneous cholecystostomy placement until more stable for surgery.   Patient denies abdominal pain.  He last ate this AM, but has been NPO since.  Heparin was placed on hold around 10AM.   Past Medical History:  Diagnosis Date  . CKD (chronic kidney disease), stage III   . Diabetes mellitus   . DVT (deep venous thrombosis) ([place]) 10/14/2016  . Extremity edema 09/2016  . Hyperlipemia   . Hypertension     Past Surgical History:  Procedure Laterality Date  . COLONOSCOPY      Allergies: Patient has no known allergies.  Medications: Prior to Admission medications   Medication Sig Start Date End Date Taking? Authorizing Provider  aspirin EC 81 MG tablet Take 81 mg by mouth every morning.    Yes Historical Provider, MD  cloNIDine (CATAPRES) 0.2 MG tablet Take 0.2 mg by mouth daily.    Yes Historical Provider, MD  furosemide (LASIX) 20 MG tablet Take 10 mg by mouth daily as needed for edema or fluid. 08/14/16  Yes Historical Provider, MD  glipiZIDE (GLUCOTROL) 10 MG tablet Take 10 mg by mouth daily before  breakfast.    Yes Historical Provider, MD  hydrALAZINE (APRESOLINE) 25 MG tablet Take 25 mg by mouth every evening.    Yes Historical Provider, MD  lisinopril (PRINIVIL,ZESTRIL) 40 MG tablet Take 20 mg by mouth every evening.    Yes Historical Provider, MD  metFORMIN (GLUCOPHAGE) 1000 MG tablet Take 1,000 mg by mouth every evening.    Yes Historical Provider, MD  simvastatin (ZOCOR) 40 MG tablet Take 40 mg by mouth every evening.    Yes Historical Provider, MD  verapamil (CALAN-SR) 240 MG CR tablet Take 240 mg by mouth at bedtime.   Yes Historical Provider, MD     Family History  Problem Relation Age of Onset  . Heart attack Other   . Tuberculosis Other   . Tuberculosis Other   . Diabetes type II Father   . Hypertension Father   . Heart attack Maternal Aunt     Social History   Social History  . Marital status: Widowed    Spouse name: N/A  . Number of children: N/A  . Years of education: N/A   Social History Main Topics  . Smoking status: Never Smoker  . Smokeless tobacco: Never Used  . Alcohol use No  . Drug use: No  . Sexual activity: Not Asked   Other Topics Concern  . None   Social History Narrative  . None     Review of Systems  Constitutional: Positive for fever. Negative for appetite change.  Respiratory: Negative for cough and shortness of breath.  Cardiovascular: Negative for chest pain.  Gastrointestinal: Positive for vomiting (yesterday). Negative for abdominal distention and abdominal pain.  Psychiatric/Behavioral: Negative for behavioral problems and confusion.    Vital Signs: BP 116/61 (BP Location: Right Arm)   Pulse 96   Temp (!) 101.1 F (38.4 C) (Oral)   Resp 18   Ht 5\' 6"  (1.676 m)   Wt 174 lb 1.6 oz (79 kg)   SpO2 95%   BMI 28.10 kg/m   Physical Exam  Constitutional: He is oriented to person, place, and time. He appears well-developed.  HENT:  Head: Normocephalic and atraumatic.  Cardiovascular: Normal rate, regular rhythm and  normal heart sounds.   Pulmonary/Chest: Effort normal and breath sounds normal. No respiratory distress.  Abdominal: Soft. He exhibits no distension. There is no tenderness.  Neurological: He is alert and oriented to person, place, and time.  Skin: Skin is warm and dry.  Psychiatric: He has a normal mood and affect. His behavior is normal. Judgment and thought content normal.  Nursing note and vitals reviewed.   Mallampati Score:  MD Evaluation Airway: WNL Heart: WNL Abdomen: WNL Chest/ Lungs: WNL ASA  Classification: 3 Mallampati/Airway Score: Three  Imaging: Ct Abdomen Pelvis Wo Contrast  Result Date: 10/13/2016 CLINICAL DATA:  Nausea, vomiting, diarrhea for the last week, diffuse weakness EXAM: CT ABDOMEN AND PELVIS WITHOUT CONTRAST TECHNIQUE: Multidetector CT imaging of the abdomen and pelvis was performed following the standard protocol without IV contrast. COMPARISON:  CT abdomen pelvis of 01/04/2012 FINDINGS: Lower chest: Mild linear atelectasis is noted at both lung bases. Coronary artery calcifications are present primarily in the distribution of the left anterior descending coronary artery. Hepatobiliary: The liver is unremarkable in the unenhanced state. The gallbladder however is distended and appears thick walled with faintly calcified gallstones. Mild strandiness is noted in the pericholecystic space, and these findings are worrisome for acute cholecystitis. Clinical correlation is recommended. Pancreas: The pancreas appears fatty infiltrated. The pancreatic duct is not dilated. Spleen: The spleen is unremarkable. Adrenals/Urinary Tract: No hydronephrosis is seen. No renal calculi are noted. There is a cyst emanating from the posterior left kidney of 2.7 cm with attenuation of 3 HU. The ureters are normal in caliber. The urinary bladder is moderately well distended with no abnormality noted. Stomach/Bowel: The stomach is only partially distended with oral contrast and food  debris. No abnormality is seen. No small bowel distention is noted. The colon is largely decompressed. The terminal ileum and appendix are unremarkable. Vascular/Lymphatic: The abdominal aorta is normal in caliber with moderate abdominal aortic atherosclerosis present. Only small retroperitoneal and mesenteric lymph nodes are present. Reproductive: The prostate gland is normal in size. Other: None Musculoskeletal: The lumbar vertebrae are in normal alignment. Diffuse degenerative disc disease is present throughout the lumbar spine. No compression deformity is seen. The SI joints are corticated with degenerative change noted. IMPRESSION: 1. Distended gallbladder with thickened wall and pericholecystic strandiness as well as gallstones. These findings are highly suspicious for acute cholecystitis and clinical correlation is recommended. 2. Moderate abdominal aortic atherosclerosis. 3. Diffuse degenerative disc disease throughout the lumbar spine. 4. Coronary artery calcifications. 5. These results will be called to the ordering clinician or representative by the Radiologist Assistant, and communication documented in the PACS or zVision Dashboard. Electronically Signed   By: Ivar Drape M.D.   On: 10/13/2016 14:19   Dg Chest 2 View  Result Date: 10/12/2016 CLINICAL DATA:  Fluid buildup EXAM: CHEST  2 VIEW COMPARISON:  10/07/2016 FINDINGS:  There is bilateral mild interstitial thickening. There is no focal parenchymal opacity. There is no pleural effusion or pneumothorax. The heart and mediastinal contours are unremarkable. The osseous structures are unremarkable. IMPRESSION: Mild interstitial thickening concerning for mild interstitial edema. Electronically Signed   By: Kathreen Devoid   On: 10/12/2016 16:37   Dg Chest 2 View  Result Date: 10/07/2016 CLINICAL DATA:  Worsening weakness over the past week. History of hypertension and diabetes. Nonsmoker. EXAM: CHEST  2 VIEW COMPARISON:  01/04/2012 FINDINGS: The  lung volumes are low accounting for crowding of interstitial lung markings and right basilar atelectasis. Slight elevation of the right hemidiaphragm relative to left. The aorta is slightly uncoiled in appearance without definite aneurysm. No pneumonic consolidation, CHF nor effusion. No suspicious osseous lesions. 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 IMPRESSION: Low lung volumes with crowding of interstitial lung markings and right basilar atelectasis. No acute cardiopulmonary disease. Electronically Signed   By: Ashley Royalty M.D.   On: 10/07/2016 22:24   Nm Hepatobiliary Liver Func  Result Date: 10/18/2016 CLINICAL DATA:  Gallstones.  Fever. EXAM: NUCLEAR MEDICINE HEPATOBILIARY IMAGING TECHNIQUE: Sequential images of the abdomen were obtained out to 60 minutes following intravenous administration of radiopharmaceutical. RADIOPHARMACEUTICALS:  5.2 mCi Tc-44m  Choletec IV COMPARISON:  Ultrasound 10/17/2016.  MRI 10/16/2016 FINDINGS: Prompt uptake and biliary excretion of activity by the liver is seen. Large photopenic defect related to distended gallbladder. No filling of the gallbladder. Small bowel activity is identified beginning at 15 minutes. The patient received 3.0 mg IV morphine at 1 hour. Additional scanning for 30 minutes demonstrates no gallbladder activity. IMPRESSION: Markedly distended gallbladder. No gallbladder uptake is seen the 4 after IV morphine administration. Findings consistent with cystic duct obstruction. Electronically Signed   By: Franchot Gallo M.D.   On: 10/18/2016 10:14   US Renal  Result Date: 10/08/2016 CLINICAL DATA:  Acute renal failure. EXAM: RENAL / URINARY TRACT ULTRASOUND COMPLETE COMPARISON:  CT abdomen and pelvis 01/04/2012. FINDINGS: Right Kidney: Length: 11.2 cm. Echogenicity within normal limits. No mass or hydronephrosis visualized. Incidental note is made of sludge and tiny stones within the gallbladder. No evidence of cholecystitis is present on study.  Left Kidney: Length: 10.0 cm. Echogenicity within normal limits. No hydronephrosis visualized. Cyst in the midpole measures 3.5 cm in diameter. There may be a single thin septation the cyst. Bladder: Appears normal for degree of bladder distention. IMPRESSION: No acute abnormality.  Negative for hydronephrosis. Incidental note is made of sludge and small stones in the gallbladder. Finding is present on the prior CT. Electronically Signed   By: Inge Rise M.D.   On: 10/08/2016 10:00   Mr Abdomen Mrcp Wo Contrast  Result Date: 10/16/2016 CLINICAL DATA:  Patient with history of cholecystitis. Evaluate for choledocholithiasis. EXAM: MRI ABDOMEN WITHOUT CONTRAST  (INCLUDING MRCP) TECHNIQUE: Multiplanar multisequence MR imaging of the abdomen was performed. Heavily T2-weighted images of the biliary and pancreatic ducts were obtained, and three-dimensional MRCP images were rendered by post processing. COMPARISON:  CT abdomen pelvis 10/13/2016. FINDINGS: Lower chest: Dependent atelectasis within the lower lobes bilaterally. Hepatobiliary: Liver is normal in size and contour. The gallbladder is distended and contains multiple stones and sludge. There is gallbladder wall thickening and a small amount of pericholecystic fluid. Evaluation of the common bile duct is limited due to motion artifact. Common bile duct is normal in diameter measuring 6 mm without definite intraluminal filling defect identified. No intrahepatic biliary ductal dilatation. Pancreas:  Mildly atrophic. Spleen:  Unremarkable Adrenals/Urinary Tract: The adrenal glands are normal. There is a 2.7 cm cyst within the interpolar region of the left kidney. Small 1.5 cm T2 bright structure within the superior pole of the right kidney, favored to represent a small cyst. Stomach/Bowel: No abnormal bowel wall thickening or evidence for bowel obstruction. Vascular/Lymphatic: Normal caliber abdominal aorta. No retroperitoneal lymphadenopathy. Other:  None.  Musculoskeletal: Unremarkable. IMPRESSION: Stones and sludge within the gallbladder lumen. There is gallbladder wall thickening, distention of the gallbladder and mild surrounding fluid, raising the possibility of acute cholecystitis. Evaluation of the common bile duct is limited secondary to motion artifact however the common bile duct is normal in caliber without definite intraluminal filling defect to suggest choledocholithiasis. Electronically Signed   By: Lovey Newcomer M.D.   On: 10/16/2016 20:38   Mr 3d Recon At Scanner  Result Date: 10/16/2016 CLINICAL DATA:  Patient with history of cholecystitis. Evaluate for choledocholithiasis. EXAM: MRI ABDOMEN WITHOUT CONTRAST  (INCLUDING MRCP) TECHNIQUE: Multiplanar multisequence MR imaging of the abdomen was performed. Heavily T2-weighted images of the biliary and pancreatic ducts were obtained, and three-dimensional MRCP images were rendered by post processing. COMPARISON:  CT abdomen pelvis 10/13/2016. FINDINGS: Lower chest: Dependent atelectasis within the lower lobes bilaterally. Hepatobiliary: Liver is normal in size and contour. The gallbladder is distended and contains multiple stones and sludge. There is gallbladder wall thickening and a small amount of pericholecystic fluid. Evaluation of the common bile duct is limited due to motion artifact. Common bile duct is normal in diameter measuring 6 mm without definite intraluminal filling defect identified. No intrahepatic biliary ductal dilatation. Pancreas:  Mildly atrophic. Spleen:  Unremarkable Adrenals/Urinary Tract: The adrenal glands are normal. There is a 2.7 cm cyst within the interpolar region of the left kidney. Small 1.5 cm T2 bright structure within the superior pole of the right kidney, favored to represent a small cyst. Stomach/Bowel: No abnormal bowel wall thickening or evidence for bowel obstruction. Vascular/Lymphatic: Normal caliber abdominal aorta. No retroperitoneal lymphadenopathy. Other:   None. Musculoskeletal: Unremarkable. IMPRESSION: Stones and sludge within the gallbladder lumen. There is gallbladder wall thickening, distention of the gallbladder and mild surrounding fluid, raising the possibility of acute cholecystitis. Evaluation of the common bile duct is limited secondary to motion artifact however the common bile duct is normal in caliber without definite intraluminal filling defect to suggest choledocholithiasis. Electronically Signed   By: Lovey Newcomer M.D.   On: 10/16/2016 20:38   Dg Chest Port 1 View  Result Date: 10/16/2016 CLINICAL DATA:  Dyspnea EXAM: PORTABLE CHEST 1 VIEW COMPARISON:  10/12/2016 FINDINGS: Shallow inspiration with linear atelectasis in the lung bases. Heart size and pulmonary vascularity are normal. No focal consolidation in the lungs. No blunting of costophrenic angles. No pneumothorax. IMPRESSION: Shallow inspiration with atelectasis in the lung bases. Electronically Signed   By: Lucienne Capers M.D.   On: 10/16/2016 22:58   US Abdomen Limited Ruq  Result Date: 10/17/2016 CLINICAL DATA:  MRCP showed gallstones. EXAM: US ABDOMEN LIMITED - RIGHT UPPER QUADRANT COMPARISON:  MRCP dated 10/16/2016 FINDINGS: Gallbladder: Multiple gallstones are scattered throughout the gallbladder measuring up to 2.1 cm. There is also sludge filling the rest of the gallbladder. The gallbladder measures 7 mm and wall thickness. Sonographic Murphy's sign absent. Trace pericholecystic fluid is suggested. Common bile duct: Diameter: 5 mm Liver: There is several foci of apparent comet tail artifact or ring down artifact in the liver for example on image 48. No focal liver mass identified. IMPRESSION: 1.  Large gallstones in the gallbladder with sludge and gallbladder wall thickening. Sonographic Murphy's sign absent. Correlate clinically in assessing for acute cholecystitis. 2. Foci of comet tail or ring down artifact in the liver. These can be encountered in the setting of  pneumobilia, small cysts, or biliary hamartoma is. Confusing this picture is the absence of hepatic calcifications or obvious biliary hamartoma/cysts or pneumobilia on the prior CT and MRI. Electronically Signed   By: Van Clines M.D.   On: 10/17/2016 17:59    Labs:  CBC:  Recent Labs  10/15/16 0335 10/17/16 0917 10/18/16 0544 10/19/16 0405  WBC 11.1* 21.0* 21.8* 17.2*  HGB 10.8* 10.3* 10.2* 9.2*  HCT 33.1* 31.3* 30.9* 28.8*  PLT 374 390 370 354    COAGS:  Recent Labs  10/13/16 1414 10/18/16 0544 10/18/16 1123 10/18/16 2210 10/19/16 0405  INR 1.16  --   --   --   --   APTT 27 88* 148* 106* 67*    BMP:  Recent Labs  10/15/16 0335 10/16/16 0507 10/17/16 0917 10/18/16 0544  NA 136 137 128* 135  K 4.2 4.2 3.6 3.8  CL 99* 98* 90* 97*  CO2 26 27 27 26   GLUCOSE 144* 181* 179* 150*  BUN 18 26* 18 24*  CALCIUM 8.0* 8.6* 7.7* 8.0*  CREATININE 1.36* 1.42* 1.43* 1.81*  GFRNONAA 49* 46* 46* 34*  GFRAA 56* 53* 53* 40*    LIVER FUNCTION TESTS:  Recent Labs  10/15/16 0335 10/16/16 0507 10/17/16 0917 10/18/16 0544  BILITOT 0.7 0.8 2.7* 2.1*  AST 124* 187* 260* 117*  ALT 255* 241* 224* 161*  ALKPHOS 388* 521* 581* 466*  PROT 6.2* 7.2 6.3* 6.3*  ALBUMIN 2.0* 2.2* 1.8* 1.9*    TUMOR MARKERS: No results for input(s): AFPTM, CEA, CA199, CHROMGRNA in the last 8760 hours.  Assessment and Plan:  Acute Cholecystis Patient with history of transaminitis originally presenting for acute DVT has subsequently developed fevers and further elevated LFTs consistent with acute cholecystis.  This has been verified by CT scan.  Patient was scheduled for surgery, however multiple comorbidities are preventing and IR was consulted for percutaneous cholecystostomy.   Risks and benefits discussed with the patient including, but not limited to bleeding, infection, gallbladder perforation, bile leak, sepsis or even death. All of the patient's questions were answered, patient is  agreeable to proceed. Consent signed and in chart. Patient scheduled for procedure today.    Thank you for this interesting consult.  I greatly enjoyed meeting Stephen FUHRMEISTER Sr. and look forward to participating in their care.  A copy of this report was sent to the requesting provider on this date.  Electronically Signed: Docia Barrier 10/19/2016, 12:16 PM   I spent a total of 20 Minutes  in face to face in clinical consultation, greater than 50% of which was counseling/coordinating care for acute cholecystitis.

## 2016-10-19 NOTE — Progress Notes (Signed)
Brief progress note:  Spoke with Dr. Kathlene Cote of IR regarding percutaneous cholecystostomy tube - He will see the patient and may be able to place perc chole today. I have made pt NPO and held heparin.  Obie Dredge, PA-C [facility] Pager: [phone] Consults: [phone] Mon-Fri 7:00 am-4:30 pm Sat-Sun 7:00 am-11:30 am

## 2016-10-19 NOTE — Progress Notes (Signed)
ANTICOAGULATION CONSULT NOTE - Follow Up Consult  Pharmacy Consult for Heparin Indication: DVT  No Known Allergies  Patient Measurements: Height: 5\' 6"  (167.6 cm) Weight: 174 lb 1.6 oz (79 kg) IBW/kg (Calculated) : 63.8 Heparin Dosing Weight: 79 kg  Vital Signs: Temp: 100.7 F (38.2 C) (11/28 1845) Temp Source: Oral (11/28 1845) BP: 138/66 (11/28 1845) Pulse Rate: 97 (11/28 1845)  Labs:  Recent Labs  10/17/16 0917  10/18/16 0544 10/18/16 1123 10/18/16 2210 10/19/16 0405  HGB 10.3*  --  10.2*  --   --  9.2*  HCT 31.3*  --  30.9*  --   --  28.8*  PLT 390  --  370  --   --  354  APTT  --   < > 88* 148* 106* 67*  HEPARINUNFRC  --   --  >2.20*  --   --  >2.20*  CREATININE 1.43*  --  1.81*  --   --   --   < > = values in this interval not displayed.  Estimated Creatinine Clearance: 33.8 mL/min (by C-G formula based on SCr of 1.81 mg/dL (H)).   Medications:  Scheduled:  . aspirin EC  81 mg Oral Daily  . fentaNYL      . furosemide  40 mg Oral Daily  . hydrALAZINE  25 mg Oral QPM  . insulin aspart  0-5 Units Subcutaneous QHS  . insulin aspart  0-9 Units Subcutaneous TID WC  . lidocaine      . lisinopril  20 mg Oral QPM  . midazolam      . piperacillin-tazobactam (ZOSYN)  IV  3.375 g Intravenous Q8H  . verapamil  240 mg Oral QHS   Infusions:    Assessment: 75 yo M with new LLE DVT (diagnosed 11/23).  The patient was started on Heparin (11/21-11/24) then transitioned to apixaban (11/24-11/26) and now back to heparin (11/26 - now) due to possible need for surgery.  The patient was determined to be too unstable for surgery at this time so a perc cholecystostomy was placed today in IR.  Spoke with Dr. Annamaria Boots (on call for IR) who advised that it was ok to restart heparin this evening without a bolus.  Goal of Therapy:  Heparin level 0.3-0.7 units/ml aPTT 66-102 seconds Monitor platelets by anticoagulation protocol: Yes   Plan:  Heparin infusion at 1350  units/hr Heparin level and aPTT with AM labs  [employer], Pharm.D., BCPS Clinical Pharmacist Pager [phone] 10/19/2016 7:11 PM

## 2016-10-19 NOTE — Sedation Documentation (Signed)
Vital signs stable. 

## 2016-10-19 NOTE — Procedures (Signed)
Interventional Radiology Procedure Note  Procedure:  Percutaneous cholecystostomy  Complications:  None  Estimated Blood Loss: < 10 mL  10 Fr perc choly tube placed.  Bile sample sent for culture.  Catheter attached to gravity bag drainage.  Venetia Night. Kathlene Cote, M.D Pager:  [phone]

## 2016-10-19 NOTE — Sedation Documentation (Signed)
Patient is resting comfortably. 

## 2016-10-19 NOTE — Care Management Note (Signed)
Case Management Note  Patient Details  Name: Stephen KOPKO Sr. MRN: [id number] Date of Birth: 27-Mar-1941  Subjective/Objective:    CM following for progression and d/c planning.                 Action/Plan: 10/19/2016 Met with pt re D/C plans, pt states that he plans to d/c to SNF for short term rehav. He lives with his daughter and will return to his home with here after rehab. Plan of surgery on 10/19/2016.   Expected Discharge Date:                  Expected Discharge Plan:  Skilled Nursing Facility  In-House Referral:  Clinical Social Work  Discharge planning Services  CM Consult  Post Acute Care Choice:  NA Choice offered to:  NA  DME Arranged:   NA DME Agency:   NA  HH Arranged:   NA HH Agency:   NA  Status of Service:  Completed, signed off  If discussed at [place] of Stay Meetings, dates discussed:    Additional Comments:  Adron Bene, RN 10/19/2016, 12:00 PM

## 2016-10-19 NOTE — Progress Notes (Signed)
[facility] PROGRESS NOTE  Stephen Rogers Sr. HMC:[id number] DOB: 1940-12-25 DOA: 10/12/2016  PCP: [name], MD  Brief History/Interval Summary: 75 year old African-American male with a past medical history of hypertension, diabetes, presented to the hospital with complaints of leg swelling and inability to walk. Patient was recently hospitalized to [facility] from November 16 November 20 for acute renal failure. He was given IV fluids. He went home and noted that his legs were swollen. His Lasix was not resumed. He found it difficult to ambulate so he presented to the hospital here. He was hospitalized for further management. Patient diuresed well. He was also found to have acute DVT for which he was placed on anticoagulation. His LFTs were improving, but then he started spiking fever. He was seen by gastroenterology. Subsequently, general surgery was consulted as well for suspected cholecystitis.  Reason for Visit: possible acute cholycystitis  Consultants: Gastroenterology. General surgery  Procedures:   Lower extremity venous Doppler DVT noted in the left peroneal veins and left soleal veins. No DVT RLE. Left baker's cyst noted.  Transthoracic echocardiogram Study Conclusions - Left ventricle: The cavity size was normal. Wall thickness was   increased in a pattern of mild LVH. Systolic function was   vigorous. The estimated ejection fraction was in the range of 65%   to 70%. Wall motion was normal; there were no regional wall   motion abnormalities. Doppler parameters are consistent with   abnormal left ventricular relaxation (grade 1 diastolic   dysfunction). - Aortic valve: Mildly calcified annulus. Trileaflet. - Mitral valve: There was trivial regurgitation. - Left atrium: The atrium was mildly dilated. - Right atrium: Central venous pressure (est): 3 mm Hg. - Atrial septum: No defect or patent foramen ovale was identified. - Tricuspid valve: There was  mild regurgitation. - Pulmonary arteries: PA peak pressure: 35 mm Hg (S). - Pericardium, extracardiac: There was no pericardial effusion.  Impressions: - Mild LVH with sigmoid-shaped basal septum and LVEF 65-70%. Grade   1 diastolic dysfunction. Mild left atrial enlargement. Trivial   mitral regurgitation. Mild calcified aortic annulus. Mild   tricuspid regurgitation with PASP 35 mmHg.  Antibiotics: Zosyn 11/25  Subjective/Interval History: Started on clear liquids yesterday after the HIDA scan had some nausea and vomited after that. This morning he denies any new complaints.  ROS: Denies any cough, diarrhea, or dysuria, joint pains, skin rashes.  Objective:  Vital Signs  Vitals:   10/18/16 1700 10/18/16 2120 10/19/16 0526 10/19/16 0925  BP: (!) 102/59 107/65 (!) 109/59 116/61  Pulse: (!) 106 (!) 101 98 96  Resp: '16 17 17 18  ' Temp: 100.2 F (37.9 C) 99.7 F (37.6 C) 99.8 F (37.7 C) (!) 101.1 F (38.4 C)  TempSrc: Oral Oral Oral Oral  SpO2: 98% 99% 98% 95%  Weight:  79 kg (174 lb 1.6 oz)    Height:        Intake/Output Summary (Last 24 hours) at 10/19/16 1146 Last data filed at 10/19/16 0900  Gross per 24 hour  Intake          1359.45 ml  Output              450 ml  Net           909.45 ml   Filed Weights   10/16/16 2039 10/17/16 2054 10/18/16 2120  Weight: 87.9 kg (193 lb 12.6 oz) 88.2 kg (194 lb 7.1 oz) 79 kg (174 lb 1.6 oz)    General appearance:  alert, cooperative, appears stated age and no distress Resp: clear to auscultation bilaterally Cardio: S1 S2 is tachycardic this morning. No S3, S4. No rubs, murmurs or bruit. Minimal pedal edema.  GI: Abdomen is soft. Patient did experience some tenderness this morning in the right upper quadrant on palpation. No rebound, rigidity or guarding. Percell Miller sign is equivocal. Bowel sounds are present. No masses or organomegaly. Extremities: improved edema bilateral lower extremities.  Neurologic: Awake and alert.  Oriented 3. Cranial nerves II-12 intact. Motor strength equal bilateral upper and lower extremities    Lab Results:  Data Reviewed: I have personally reviewed following labs and imaging studies  CBC:  Recent Labs Lab 10/12/16 1707  10/14/16 0829 10/15/16 0335 10/17/16 0917 10/18/16 0544 10/19/16 0405  WBC 11.9*  < > 9.2 11.1* 21.0* 21.8* 17.2*  NEUTROABS 9.6*  --   --   --  18.5* 18.8*  --   HGB 11.6*  < > 10.8* 10.8* 10.3* 10.2* 9.2*  HCT 35.2*  < > 33.0* 33.1* 31.3* 30.9* 28.8*  MCV 87.3  < > 87.1 88.3 87.9 88.3 88.9  PLT 370  < > 402* 374 390 370 354  < > = values in this interval not displayed.  Basic Metabolic Panel:  Recent Labs Lab 10/14/16 0829 10/15/16 0335 10/16/16 0507 10/17/16 0917 10/18/16 0544  NA 136 136 137 128* 135  K 3.9 4.2 4.2 3.6 3.8  CL 100* 99* 98* 90* 97*  CO2 '27 26 27 27 26  ' GLUCOSE 164* 144* 181* 179* 150*  BUN 12 18 26* 18 24*  CREATININE 1.32* 1.36* 1.42* 1.43* 1.81*  CALCIUM 8.3* 8.0* 8.6* 7.7* 8.0*    GFR: Estimated Creatinine Clearance: 33.8 mL/min (by C-G formula based on SCr of 1.81 mg/dL (H)).  Liver Function Tests:  Recent Labs Lab 10/14/16 0829 10/15/16 0335 10/16/16 0507 10/17/16 0917 10/18/16 0544  AST 285* 124* 187* 260* 117*  ALT 365* 255* 241* 224* 161*  ALKPHOS 466* 388* 521* 581* 466*  BILITOT 1.4* 0.7 0.8 2.7* 2.1*  PROT 6.3* 6.2* 7.2 6.3* 6.3*  ALBUMIN 1.9* 2.0* 2.2* 1.8* 1.9*    No results for input(s): LIPASE, AMYLASE in the last 168 hours. Coagulation Profile:  Recent Labs Lab 10/13/16 1414  INR 1.16    Cardiac Enzymes:  Recent Labs Lab 10/12/16 1735 10/13/16 0309 10/13/16 0755 10/13/16 1414  TROPONINI 0.03* 0.05* 0.06* 0.05*    CBG:  Recent Labs Lab 10/18/16 0958 10/18/16 1130 10/18/16 1649 10/18/16 2113 10/19/16 0757  GLUCAP 136* 148* 215* 227* 159*     Thyroid Function Tests: No results for input(s): TSH, T4TOTAL, FREET4, T3FREE, THYROIDAB in the last 72  hours.   Recent Results (from the past 240 hour(s))  Culture, blood (routine x 2)     Status: None   Collection Time: 10/09/16  4:25 PM  Result Value Ref Range Status   Specimen Description BLOOD RIGHT HAND  Final   Special Requests BOTTLES DRAWN AEROBIC AND ANAEROBIC 6CC  Final   Culture NO GROWTH 5 DAYS  Final   Report Status 10/14/2016 FINAL  Final  Culture, blood (routine x 2)     Status: None   Collection Time: 10/09/16  4:36 PM  Result Value Ref Range Status   Specimen Description BLOOD LEFT HAND  Final   Special Requests BOTTLES DRAWN AEROBIC AND ANAEROBIC  Final   Culture NO GROWTH 5 DAYS  Final   Report Status 10/14/2016 FINAL  Final  Culture, blood (routine x  2)     Status: None (Preliminary result)   Collection Time: 10/16/16 10:16 PM  Result Value Ref Range Status   Specimen Description BLOOD LEFT HAND  Final   Special Requests BOTTLES DRAWN AEROBIC AND ANAEROBIC 5ML  Final   Culture NO GROWTH 1 DAY  Final   Report Status PENDING  Incomplete  Culture, blood (routine x 2)     Status: Abnormal   Collection Time: 10/16/16 10:25 PM  Result Value Ref Range Status   Specimen Description BLOOD RIGHT HAND  Final   Special Requests IN PEDIATRIC BOTTLE 3ML  Final   Culture  Setup Time   Final    GRAM NEGATIVE RODS IN PEDIATRIC BOTTLE CRITICAL RESULT CALLED TO, READ BACK BY AND VERIFIED WITH: C BALL,PHARMD AT 9628 10/17/16 BY L BENFIELD    Culture ESCHERICHIA COLI (A)  Final   Report Status 10/19/2016 FINAL  Final   Organism ID, Bacteria ESCHERICHIA COLI  Final      Susceptibility   Escherichia coli - MIC*    AMPICILLIN <=2 SENSITIVE Sensitive     CEFAZOLIN <=4 SENSITIVE Sensitive     CEFEPIME <=1 SENSITIVE Sensitive     CEFTAZIDIME <=1 SENSITIVE Sensitive     CEFTRIAXONE <=1 SENSITIVE Sensitive     CIPROFLOXACIN <=0.25 SENSITIVE Sensitive     GENTAMICIN <=1 SENSITIVE Sensitive     IMIPENEM <=0.25 SENSITIVE Sensitive     TRIMETH/SULFA <=20 SENSITIVE Sensitive      AMPICILLIN/SULBACTAM <=2 SENSITIVE Sensitive     PIP/TAZO <=4 SENSITIVE Sensitive     Extended ESBL NEGATIVE Sensitive     * ESCHERICHIA COLI  Blood Culture ID Panel (Reflexed)     Status: Abnormal   Collection Time: 10/16/16 10:25 PM  Result Value Ref Range Status   Enterococcus species NOT DETECTED NOT DETECTED Final   Listeria monocytogenes NOT DETECTED NOT DETECTED Final   Staphylococcus species NOT DETECTED NOT DETECTED Final   Staphylococcus aureus NOT DETECTED NOT DETECTED Final   Streptococcus species NOT DETECTED NOT DETECTED Final   Streptococcus agalactiae NOT DETECTED NOT DETECTED Final   Streptococcus pneumoniae NOT DETECTED NOT DETECTED Final   Streptococcus pyogenes NOT DETECTED NOT DETECTED Final   Acinetobacter baumannii NOT DETECTED NOT DETECTED Final   Enterobacteriaceae species DETECTED (A) NOT DETECTED Final    Comment: CRITICAL RESULT CALLED TO, READ BACK BY AND VERIFIED WITH: C BALL,PHARMD AT 1405 10/17/16 BY L BENFIELD    Enterobacter cloacae complex NOT DETECTED NOT DETECTED Final   Escherichia coli DETECTED (A) NOT DETECTED Final    Comment: CRITICAL RESULT CALLED TO, READ BACK BY AND VERIFIED WITH: C BALL,PHARMD AT 1405 10/17/16 BY L BENFIELD    Klebsiella oxytoca NOT DETECTED NOT DETECTED Final   Klebsiella pneumoniae NOT DETECTED NOT DETECTED Final   Proteus species NOT DETECTED NOT DETECTED Final   Serratia marcescens NOT DETECTED NOT DETECTED Final   Carbapenem resistance NOT DETECTED NOT DETECTED Final   Haemophilus influenzae NOT DETECTED NOT DETECTED Final   Neisseria meningitidis NOT DETECTED NOT DETECTED Final   Pseudomonas aeruginosa NOT DETECTED NOT DETECTED Final   Candida albicans NOT DETECTED NOT DETECTED Final   Candida glabrata NOT DETECTED NOT DETECTED Final   Candida krusei NOT DETECTED NOT DETECTED Final   Candida parapsilosis NOT DETECTED NOT DETECTED Final   Candida tropicalis NOT DETECTED NOT DETECTED Final      Radiology  Studies: Nm Hepatobiliary Liver Func  Result Date: 10/18/2016 CLINICAL DATA:  Gallstones.  Fever. EXAM:  NUCLEAR MEDICINE HEPATOBILIARY IMAGING TECHNIQUE: Sequential images of the abdomen were obtained out to 60 minutes following intravenous administration of radiopharmaceutical. RADIOPHARMACEUTICALS:  5.2 mCi Tc-66m Choletec IV COMPARISON:  Ultrasound 10/17/2016.  MRI 10/16/2016 FINDINGS: Prompt uptake and biliary excretion of activity by the liver is seen. Large photopenic defect related to distended gallbladder. No filling of the gallbladder. Small bowel activity is identified beginning at 15 minutes. The patient received 3.0 mg IV morphine at 1 hour. Additional scanning for 30 minutes demonstrates no gallbladder activity. IMPRESSION: Markedly distended gallbladder. No gallbladder uptake is seen the 4 after IV morphine administration. Findings consistent with cystic duct obstruction. Electronically Signed   By: CFranchot GalloM.D.   On: 10/18/2016 10:14   [place]domen Limited Ruq  Result Date: 10/17/2016 CLINICAL DATA:  MRCP showed gallstones. EXAM: [place]DOMEN LIMITED - RIGHT UPPER QUADRANT COMPARISON:  MRCP dated 10/16/2016 FINDINGS: Gallbladder: Multiple gallstones are scattered throughout the gallbladder measuring up to 2.1 cm. There is also sludge filling the rest of the gallbladder. The gallbladder measures 7 mm and wall thickness. Sonographic Murphy's sign absent. Trace pericholecystic fluid is suggested. Common bile duct: Diameter: 5 mm Liver: There is several foci of apparent comet tail artifact or ring down artifact in the liver for example on image 48. No focal liver mass identified. IMPRESSION: 1. Large gallstones in the gallbladder with sludge and gallbladder wall thickening. Sonographic Murphy's sign absent. Correlate clinically in assessing for acute cholecystitis. 2. Foci of comet tail or ring down artifact in the liver. These can be encountered in the setting of pneumobilia, small cysts, or  biliary hamartoma is. Confusing this picture is the absence of hepatic calcifications or obvious biliary hamartoma/cysts or pneumobilia on the prior CT and MRI. Electronically Signed   By: WVan ClinesM.D.   On: 10/17/2016 17:59     Medications:  Scheduled: . aspirin EC  81 mg Oral Daily  . furosemide  40 mg Oral Daily  . hydrALAZINE  25 mg Oral QPM  . insulin aspart  0-5 Units Subcutaneous QHS  . insulin aspart  0-9 Units Subcutaneous TID WC  . lisinopril  20 mg Oral QPM  . piperacillin-tazobactam (ZOSYN)  IV  3.375 g Intravenous Q8H  . verapamil  240 mg Oral QHS   Continuous:  PUTM:[id number] albuterol, ondansetron **OR** ondansetron (ZOFRAN) IV  Assessment/Plan:  Principal Problem:   Bilateral lower extremity edema Active Problems:   Transaminitis   Hyperlipemia   At high risk for fluid overload   Fluid overload   Gait disturbance   Leukocytosis   CKD (chronic kidney disease), stage III   Left leg DVT (HCC)    Escherichia coli bacteremia and sepsis -Patient spiked temperature of 103 while he is in the hospital, blood cultures showed Escherichia coli. -Met sepsis criteria with temperature of 103, heart rate of 115. -No evidence of hypoperfusion, normal lactic acid. -Treated with IV fluids and antibiotics, sepsis physiology resolved. -Pansensitive Escherichia coli, can be discharged on oral amoxicillin or Cipro for total of 2 weeks.  Acute cholecystitis -Fever, transaminitis and jaundice, started on Zosyn. -Abdominal ultrasound showed multiple gallstones. -HIDA scan showing cystic duct obstruction consistent with acute cholecystitis. -Initially patient lined up for laparoscopic cholecystectomy on Wednesday, general surgery now recommending percutaneous drain.  Transaminitis -Likely secondary to acute cholecystitis  Acute Distal left lower extremity DVT -Likely due to recent hospitalization and sedentary status. Initially placed on IV heparin.  Transitioned to Eliquis. -Eliquis on hold, on IV heparin prior to  procedure.  Bilateral lower extremity edema With gait disturbance -Patient's inability to ambulate, appears to have been secondary to his edema rather than a neurological process. -No neurological abnormalities noted on examination. Patient's symptoms have improved with the diuresis.  -Continues to be very weak, PT recommended SNF. -CSW consult, likely can be discharged 1-2 days after placement of percutaneous drain.  Elevated troponin/abnormal EKG Patient with nonspecific changes on EKG. No acute ischemic changes noted. Patient denies any chest pains. Echocardiogram does not show any wall motion abnormalities. Do not anticipate any further inpatient testing at this time.  Essential hypertension Continue home medications. Monitor blood pressures closely.   Diabetes mellitus type 2 with hyperglycemia Holding his oral agents. SSI. HbA1c is 8.4.  Chronic kidney disease stage 3 Improved. Creatinine improved from previous admission from 4 down to 1.2. Renal function is stable.   Hyponatremia Sudden drop in sodium level compared to yesterday. Repeat tomorrow morning.  Hyperlipidemia Continue to hold simvastatin due to elevated transaminases  Hypoalbuminemia Question of poor nutrition contributing in part to peripheral edema.   Normocytic anemia. Anemia panel reviewed. No clear deficiencies identified. No evidence for overt bleeding.  DVT Prophylaxis: IV heparin Code Status: Full code  Family Communication: Discussed with the patient  Disposition Plan: Needs SNF, 1-2 days after placement of the perc darin    LOS: 6 days   Birdie Hopes, MD [facility] Pager [phone] 10/19/2016, 11:46 AM  If 7PM-7AM, please contact night-coverage at [email or web address], password [facility]

## 2016-10-19 NOTE — Progress Notes (Addendum)
ANTICOAGULATION & ANTIBIOTIC CONSULT NOTE - Follow Up Consult  Pharmacy Consult for Heparin (holding Apixaban) & Zosyn Indication: New acute LLE DVT (10/14/16) + E. Coli bacteremia  No Known Allergies  Patient Measurements: Height: 5\' 6"  (167.6 cm) Weight: 174 lb 1.6 oz (79 kg) IBW/kg (Calculated) : 63.8  Vital Signs: Temp: 99.8 F (37.7 C) (11/28 0526) Temp Source: Oral (11/28 0526) BP: 109/59 (11/28 0526) Pulse Rate: 98 (11/28 0526)  Labs:  Recent Labs  10/17/16 0917  10/18/16 0544 10/18/16 1123 10/18/16 2210 10/19/16 0405  HGB 10.3*  --  10.2*  --   --  9.2*  HCT 31.3*  --  30.9*  --   --  28.8*  PLT 390  --  370  --   --  354  APTT  --   < > 88* 148* 106* 67*  HEPARINUNFRC  --   --  >2.20*  --   --  >2.20*  CREATININE 1.43*  --  1.81*  --   --   --   < > = values in this interval not displayed.  Estimated Creatinine Clearance: 33.8 mL/min (by C-G formula based on SCr of 1.81 mg/dL (H)).  Assessment: 6 YOM recently admitted to [facility] from 11/16-11/20 with AKI secondary to N/V/D who re-presented to [facility] on 11/22 with lower extremity swelling. The patient was also noted to have elevated LFTs. Dopplers confirmed a LLE DVT and pharmacy was consulted to start anticoagulation. The patient was started on Heparin (11/21-11/24) then transitioned to apixaban (11/24-11/26) and now back to heparin (11/26 - now) due to possible need for surgery.  The patient's aPTT level this morning is therapeutic however trending down (aPTT 67 << 106, goal of 66-102). Hgb/Hct slight drop, plts wnl. No overt s/sx of bleeding have been noted. Noted plans for cholecystectomy on 11/29.   The patient also continues on Zosyn for E. Coli bacteremia. The MD did not want to narrow until sensitivities have resulted. Hopefully to narrow soon. Tmax/24h: 100.2, WBC 17.2 << 21.8. SCr 1.81, CrCl~30-40 ml/min. Dose remains appropriate at this time.   Goal of Therapy:  Heparin level 0.3-0.7 units/ml aPTT 66-102  seconds Monitor platelets by anticoagulation protocol: Yes  Proper antibiotics for infection/cultures adjusted for renal/hepatic function    Plan:  1. Increase the heparin drip slightly to 1350 units/hr (13.5 ml/hr) to keep within range 2. Will continue to monitor for any signs/symptoms of bleeding and will follow up with heparin level in 8 hours  3. Continue Zosyn 3.375g IV every 8 hours (4 hr infusion) 4. Will continue to follow renal function, culture results, LOT, and antibiotic de-escalation plans   Thank you for allowing pharmacy to be a part of this patient's care.  Alycia Rossetti, PharmD, BCPS Clinical Pharmacist Pager: [phone] Clinical phone for 10/19/2016 from 7a-3:30p: [phone] If after 3:30p, please call main pharmacy at: [phone] 10/19/2016 7:51 AM

## 2016-10-19 NOTE — Sedation Documentation (Signed)
Pt has no complaints at this time  

## 2016-10-19 NOTE — Progress Notes (Signed)
Inpatient Diabetes Program Recommendations  AACE/ADA: New Consensus Statement on Inpatient Glycemic Control (2015)  Target Ranges:  Prepandial:   less than 140 mg/dL      Peak postprandial:   less than 180 mg/dL (1-2 hours)      Critically ill patients:  140 - 180 mg/dL   Lab Results  Component Value Date   GLUCAP 159 (H) 10/19/2016   HGBA1C 8.4 (H) 10/14/2016    Review of Glycemic Control  Results for Stephen Rogers, Stephen SR. (MRN [id number]) as of 10/19/2016 11:15  Ref. Range 10/18/2016 09:58 10/18/2016 11:30 10/18/2016 16:49 10/18/2016 21:13 10/19/2016 07:57  Glucose-Capillary Latest Ref Range: 65 - 99 mg/dL 136 (H) 148 (H) 215 (H) 227 (H) 159 (H)    Diabetes history: Type 2 Outpatient Diabetes medications: Glipizide 10mg  qam, Glucophage 1000mg  qday Current orders for Inpatient glycemic control: Novolog 0-9 units tid, Novolog 0-5 units qhs  Inpatient Diabetes Program Recommendations: Agree with current medications for blood sugar management.   Gentry Fitz, RN, BA, MHA, CDE Diabetes Coordinator Inpatient Diabetes Program  [phone] (Team Pager) [phone] ([place]) 10/19/2016 11:17 AM

## 2016-10-19 NOTE — Clinical Social Work Note (Addendum)
CSW talked by phone with patient's oldest daughter, Stephen Rogers regarding discharge disposition for patient. Daughter provided with facility responses and indicated that she will talk with family and get back with CSW with SNF decision.   CSW also spoke with patient regarding conversations with daughter and Stephen Rogers reported that he had been given a skilled facility list and gave it to his daughter Stephen Rogers.   Stephen Rogers, MSW, LCSW Licensed Clinical Social Worker [place] [phone]

## 2016-10-20 DIAGNOSIS — R740 Nonspecific elevation of levels of transaminase and lactic acid dehydrogenase [LDH]: Secondary | ICD-10-CM

## 2016-10-20 DIAGNOSIS — N183 Chronic kidney disease, stage 3 (moderate): Secondary | ICD-10-CM

## 2016-10-20 DIAGNOSIS — I82492 Acute embolism and thrombosis of other specified deep vein of left lower extremity: Secondary | ICD-10-CM

## 2016-10-20 DIAGNOSIS — R600 Localized edema: Secondary | ICD-10-CM

## 2016-10-20 LAB — COMPREHENSIVE METABOLIC PANEL
ALBUMIN: 1.6 g/dL — AB (ref 3.5–5.0)
ALK PHOS: 305 U/L — AB (ref 38–126)
ALT: 83 U/L — ABNORMAL HIGH (ref 17–63)
ANION GAP: 13 (ref 5–15)
AST: 69 U/L — ABNORMAL HIGH (ref 15–41)
BILIRUBIN TOTAL: 1.3 mg/dL — AB (ref 0.3–1.2)
BUN: 33 mg/dL — ABNORMAL HIGH (ref 6–20)
CALCIUM: 7.9 mg/dL — AB (ref 8.9–10.3)
CO2: 26 mmol/L (ref 22–32)
Chloride: 94 mmol/L — ABNORMAL LOW (ref 101–111)
Creatinine, Ser: 2.03 mg/dL — ABNORMAL HIGH (ref 0.61–1.24)
GFR calc non Af Amer: 30 mL/min — ABNORMAL LOW (ref >60)
GFR, EST AFRICAN AMERICAN: 35 mL/min — AB (ref >60)
GLUCOSE: 216 mg/dL — AB (ref 65–99)
Potassium: 3.7 mmol/L (ref 3.5–5.1)
Sodium: 133 mmol/L — ABNORMAL LOW (ref 135–145)
TOTAL PROTEIN: 6.2 g/dL — AB (ref 6.5–8.1)

## 2016-10-20 LAB — CBC
HEMATOCRIT: 27.3 % — AB (ref 39.0–52.0)
HEMOGLOBIN: 8.8 g/dL — AB (ref 13.0–17.0)
MCH: 28.7 pg (ref 26.0–34.0)
MCHC: 32.2 g/dL (ref 30.0–36.0)
MCV: 88.9 fL (ref 78.0–100.0)
Platelets: 393 10*3/uL (ref 150–400)
RBC: 3.07 MIL/uL — ABNORMAL LOW (ref 4.22–5.81)
RDW: 14.6 % (ref 11.5–15.5)
WBC: 18 10*3/uL — ABNORMAL HIGH (ref 4.0–10.5)

## 2016-10-20 LAB — APTT
aPTT: 61 seconds — ABNORMAL HIGH (ref 24–36)
aPTT: 81 seconds — ABNORMAL HIGH (ref 24–36)

## 2016-10-20 LAB — GLUCOSE, CAPILLARY
GLUCOSE-CAPILLARY: 188 mg/dL — AB (ref 65–99)
Glucose-Capillary: 252 mg/dL — ABNORMAL HIGH (ref 65–99)
Glucose-Capillary: 239 mg/dL — ABNORMAL HIGH (ref 65–99)
Glucose-Capillary: 268 mg/dL — ABNORMAL HIGH (ref 65–99)

## 2016-10-20 LAB — HEPARIN LEVEL (UNFRACTIONATED)
Heparin Unfractionated: 1.94 IU/mL — ABNORMAL HIGH (ref 0.30–0.70)
Heparin Unfractionated: 1.84 IU/mL — ABNORMAL HIGH (ref 0.30–0.70)

## 2016-10-20 MED ORDER — BISACODYL 5 MG PO TBEC
10.0000 mg | DELAYED_RELEASE_TABLET | Freq: Once | ORAL | Status: AC
  Administered 2016-10-20: 10 mg via ORAL
  Filled 2016-10-20: qty 2

## 2016-10-20 MED ORDER — SODIUM CHLORIDE 0.9 % IV BOLUS (SEPSIS)
500.0000 mL | Freq: Once | INTRAVENOUS | Status: AC
  Administered 2016-10-20: 500 mL via INTRAVENOUS

## 2016-10-20 NOTE — Progress Notes (Signed)
Patients temperature was 101.5. 650 mg of Tylenol given to patient. MD notified. Orders to repeat blood culture. Will re-assess patient and continue to monitor.

## 2016-10-20 NOTE — Progress Notes (Signed)
New orders received for 500 cc normal saline bolus for fever and tylenol given.Will continue to monitor patient.

## 2016-10-20 NOTE — Progress Notes (Signed)
Patient has temperature 102.2 tonight.Text paged K.schorr NP.

## 2016-10-20 NOTE — Progress Notes (Signed)
Patient ID: Stephen Bales Sr., male   DOB: 1941/05/26, 75 y.o.   MRN: [id number]    Referring Physician(s): Dr. Maureen Chatters  Supervising Physician: Jacqulynn Cadet  Patient Status:  [facility] - In-pt  Chief Complaint:  Acute cholecystitis  Subjective:  Patient feeling better today.  Sitting up in bed eating lunch.  Denies pain.  Percutaneous cholecystomy tube in place with blood-tinged bile. 75 mL output yesterday.  Note change in CBC today.   Allergies: Patient has no known allergies.  Medications: Prior to Admission medications   Medication Sig Start Date End Date Taking? Authorizing Provider  aspirin EC 81 MG tablet Take 81 mg by mouth every morning.    Yes Historical Provider, MD  cloNIDine (CATAPRES) 0.2 MG tablet Take 0.2 mg by mouth daily.    Yes Historical Provider, MD  furosemide (LASIX) 20 MG tablet Take 10 mg by mouth daily as needed for edema or fluid. 08/14/16  Yes Historical Provider, MD  glipiZIDE (GLUCOTROL) 10 MG tablet Take 10 mg by mouth daily before breakfast.    Yes Historical Provider, MD  hydrALAZINE (APRESOLINE) 25 MG tablet Take 25 mg by mouth every evening.    Yes Historical Provider, MD  lisinopril (PRINIVIL,ZESTRIL) 40 MG tablet Take 20 mg by mouth every evening.    Yes Historical Provider, MD  metFORMIN (GLUCOPHAGE) 1000 MG tablet Take 1,000 mg by mouth every evening.    Yes Historical Provider, MD  simvastatin (ZOCOR) 40 MG tablet Take 40 mg by mouth every evening.    Yes Historical Provider, MD  verapamil (CALAN-SR) 240 MG CR tablet Take 240 mg by mouth at bedtime.   Yes Historical Provider, MD     Vital Signs: BP 136/68 (BP Location: Left Arm)   Pulse (!) 105   Temp 98 F (36.7 C) (Oral)   Resp 20   Ht 5\' 6"  (1.676 m)   Wt 196 lb 13.9 oz (89.3 kg)   SpO2 96%   BMI 31.78 kg/m   Physical Exam  Constitutional: He is oriented to person, place, and time. He appears well-developed.  Abdominal: Soft. He exhibits no distension. There is  no tenderness.  Percutaneous drain in place.  Blood-tinged bilious output. 75 mL output yesterday.  Drain insertion site c/d/i.  Neurological: He is alert and oriented to person, place, and time.  Skin: Skin is warm and dry.  Psychiatric: He has a normal mood and affect. His behavior is normal. Judgment and thought content normal.  Nursing note and vitals reviewed.   Imaging: Nm Hepatobiliary Liver Func  Result Date: 10/18/2016 CLINICAL DATA:  Gallstones.  Fever. EXAM: NUCLEAR MEDICINE HEPATOBILIARY IMAGING TECHNIQUE: Sequential images of the abdomen were obtained out to 60 minutes following intravenous administration of radiopharmaceutical. RADIOPHARMACEUTICALS:  5.2 mCi Tc-37m  Choletec IV COMPARISON:  Ultrasound 10/17/2016.  MRI 10/16/2016 FINDINGS: Prompt uptake and biliary excretion of activity by the liver is seen. Large photopenic defect related to distended gallbladder. No filling of the gallbladder. Small bowel activity is identified beginning at 15 minutes. The patient received 3.0 mg IV morphine at 1 hour. Additional scanning for 30 minutes demonstrates no gallbladder activity. IMPRESSION: Markedly distended gallbladder. No gallbladder uptake is seen the 4 after IV morphine administration. Findings consistent with cystic duct obstruction. Electronically Signed   By: Franchot Gallo M.D.   On: 10/18/2016 10:14   Mr Abdomen Mrcp Wo Contrast  Result Date: 10/16/2016 CLINICAL DATA:  Patient with history of cholecystitis. Evaluate for choledocholithiasis. EXAM: MRI ABDOMEN WITHOUT  CONTRAST  (INCLUDING MRCP) TECHNIQUE: Multiplanar multisequence MR imaging of the abdomen was performed. Heavily T2-weighted images of the biliary and pancreatic ducts were obtained, and three-dimensional MRCP images were rendered by post processing. COMPARISON:  CT abdomen pelvis 10/13/2016. FINDINGS: Lower chest: Dependent atelectasis within the lower lobes bilaterally. Hepatobiliary: Liver is normal in size and  contour. The gallbladder is distended and contains multiple stones and sludge. There is gallbladder wall thickening and a small amount of pericholecystic fluid. Evaluation of the common bile duct is limited due to motion artifact. Common bile duct is normal in diameter measuring 6 mm without definite intraluminal filling defect identified. No intrahepatic biliary ductal dilatation. Pancreas:  Mildly atrophic. Spleen:  Unremarkable Adrenals/Urinary Tract: The adrenal glands are normal. There is a 2.7 cm cyst within the interpolar region of the left kidney. Small 1.5 cm T2 bright structure within the superior pole of the right kidney, favored to represent a small cyst. Stomach/Bowel: No abnormal bowel wall thickening or evidence for bowel obstruction. Vascular/Lymphatic: Normal caliber abdominal aorta. No retroperitoneal lymphadenopathy. Other:  None. Musculoskeletal: Unremarkable. IMPRESSION: Stones and sludge within the gallbladder lumen. There is gallbladder wall thickening, distention of the gallbladder and mild surrounding fluid, raising the possibility of acute cholecystitis. Evaluation of the common bile duct is limited secondary to motion artifact however the common bile duct is normal in caliber without definite intraluminal filling defect to suggest choledocholithiasis. Electronically Signed   By: Lovey Newcomer M.D.   On: 10/16/2016 20:38   Mr 3d Recon At Scanner  Result Date: 10/16/2016 CLINICAL DATA:  Patient with history of cholecystitis. Evaluate for choledocholithiasis. EXAM: MRI ABDOMEN WITHOUT CONTRAST  (INCLUDING MRCP) TECHNIQUE: Multiplanar multisequence MR imaging of the abdomen was performed. Heavily T2-weighted images of the biliary and pancreatic ducts were obtained, and three-dimensional MRCP images were rendered by post processing. COMPARISON:  CT abdomen pelvis 10/13/2016. FINDINGS: Lower chest: Dependent atelectasis within the lower lobes bilaterally. Hepatobiliary: Liver is normal in  size and contour. The gallbladder is distended and contains multiple stones and sludge. There is gallbladder wall thickening and a small amount of pericholecystic fluid. Evaluation of the common bile duct is limited due to motion artifact. Common bile duct is normal in diameter measuring 6 mm without definite intraluminal filling defect identified. No intrahepatic biliary ductal dilatation. Pancreas:  Mildly atrophic. Spleen:  Unremarkable Adrenals/Urinary Tract: The adrenal glands are normal. There is a 2.7 cm cyst within the interpolar region of the left kidney. Small 1.5 cm T2 bright structure within the superior pole of the right kidney, favored to represent a small cyst. Stomach/Bowel: No abnormal bowel wall thickening or evidence for bowel obstruction. Vascular/Lymphatic: Normal caliber abdominal aorta. No retroperitoneal lymphadenopathy. Other:  None. Musculoskeletal: Unremarkable. IMPRESSION: Stones and sludge within the gallbladder lumen. There is gallbladder wall thickening, distention of the gallbladder and mild surrounding fluid, raising the possibility of acute cholecystitis. Evaluation of the common bile duct is limited secondary to motion artifact however the common bile duct is normal in caliber without definite intraluminal filling defect to suggest choledocholithiasis. Electronically Signed   By: Lovey Newcomer M.D.   On: 10/16/2016 20:38   Ir Perc Cholecystostomy  Result Date: 10/19/2016 CLINICAL DATA:  Cholecystitis and need for percutaneous cholecystostomy due to poor candidacy for immediate cholecystectomy currently. EXAM: PERCUTANEOUS CHOLECYSTOSTOMY COMPARISON:  CT of the abdomen on 10/13/2016 and ultrasound on 10/17/2016. ANESTHESIA/SEDATION: 2.0 mg IV Versed; 100 mcg IV Fentanyl. Total Moderate Sedation Time 13 minutes. The patient's level of consciousness and  physiologic status were continuously monitored during the procedure by Radiology nursing. CONTRAST:  10 mL Isovue-300 MEDICATIONS:  No additional medications. FLUOROSCOPY TIME:  1 minutes and 12 seconds.  13 mGy. PROCEDURE: The procedure, risks, benefits, and alternatives were explained to the patient. Questions regarding the procedure were encouraged and answered. The patient understands and consents to the procedure. A time-out was performed prior to initiating the procedure. The right abdominal wall was prepped with chlorhexidine in a sterile fashion, and a sterile drape was applied covering the operative field. A sterile gown and sterile gloves were used for the procedure. Local anesthesia was provided with 1% Lidocaine. Ultrasound image documentation was performed. Fluoroscopy during the procedure and fluoro spot radiograph confirms appropriate catheter position. Ultrasound was utilized to localize the gallbladder. Under direct ultrasound guidance, a 21 gauge needle was advanced via a transhepatic approach into the gallbladder lumen. Aspiration was performed and a bile sample sent for culture studies. A small amount of diluted contrast material was injected. A guide wire was then advanced into the gallbladder. A transitional dilator was placed. Percutaneous tract dilatation was then performed over a guide wire to 10-French. A 10-French pigtail drainage catheter was then advanced into the gallbladder lumen under fluoroscopy. Catheter was formed and injected with contrast material to confirm position. The catheter was flushed and connected to a gravity drainage bag. It was secured at the skin with a Prolene retention suture and Stat-Lock device. COMPLICATIONS: None FINDINGS: After needle puncture of the gallbladder, a bile sample was aspirated and sent for culture. The cholecystostomy tube was advanced into the gallbladder lumen and formed. It is now draining bile. This tube will be left to gravity drainage. IMPRESSION: Percutaneous cholecystostomy with placement of 10-French drainage catheter into the gallbladder lumen. This was left to  gravity drainage. Electronically Signed   By: [name] M.D.   On: 10/19/2016 17:11   Dg Chest Port 1 View  Result Date: 10/16/2016 CLINICAL DATA:  Dyspnea EXAM: PORTABLE CHEST 1 VIEW COMPARISON:  10/12/2016 FINDINGS: Shallow inspiration with linear atelectasis in the lung bases. Heart size and pulmonary vascularity are normal. No focal consolidation in the lungs. No blunting of costophrenic angles. No pneumothorax. IMPRESSION: Shallow inspiration with atelectasis in the lung bases. Electronically Signed   By: Lucienne Capers M.D.   On: 10/16/2016 22:58   US Abdomen Limited Ruq  Result Date: 10/17/2016 CLINICAL DATA:  MRCP showed gallstones. EXAM: US ABDOMEN LIMITED - RIGHT UPPER QUADRANT COMPARISON:  MRCP dated 10/16/2016 FINDINGS: Gallbladder: Multiple gallstones are scattered throughout the gallbladder measuring up to 2.1 cm. There is also sludge filling the rest of the gallbladder. The gallbladder measures 7 mm and wall thickness. Sonographic Murphy's sign absent. Trace pericholecystic fluid is suggested. Common bile duct: Diameter: 5 mm Liver: There is several foci of apparent comet tail artifact or ring down artifact in the liver for example on image 48. No focal liver mass identified. IMPRESSION: 1. Large gallstones in the gallbladder with sludge and gallbladder wall thickening. Sonographic Murphy's sign absent. Correlate clinically in assessing for acute cholecystitis. 2. Foci of comet tail or ring down artifact in the liver. These can be encountered in the setting of pneumobilia, small cysts, or biliary hamartoma is. Confusing this picture is the absence of hepatic calcifications or obvious biliary hamartoma/cysts or pneumobilia on the prior CT and MRI. Electronically Signed   By: Van Clines M.D.   On: 10/17/2016 17:59    Labs:  CBC:  Recent Labs  10/17/16 0917  10/18/16 0544 10/19/16 0405 10/20/16 0548  WBC 21.0* 21.8* 17.2* 18.0*  HGB 10.3* 10.2* 9.2* 8.8*  HCT 31.3*  30.9* 28.8* 27.3*  PLT 390 370 354 393    COAGS:  Recent Labs  10/13/16 1414  10/18/16 1123 10/18/16 2210 10/19/16 0405 10/20/16 0548  INR 1.16  --   --   --   --   --   APTT 27  < > 148* 106* 67* 61*  < > = values in this interval not displayed.  BMP:  Recent Labs  10/16/16 0507 10/17/16 0917 10/18/16 0544 10/20/16 0548  NA 137 128* 135 133*  K 4.2 3.6 3.8 3.7  CL 98* 90* 97* 94*  CO2 27 27 26 26   GLUCOSE 181* 179* 150* 216*  BUN 26* 18 24* 33*  CALCIUM 8.6* 7.7* 8.0* 7.9*  CREATININE 1.42* 1.43* 1.81* 2.03*  GFRNONAA 46* 46* 34* 30*  GFRAA 53* 53* 40* 35*    LIVER FUNCTION TESTS:  Recent Labs  10/16/16 0507 10/17/16 0917 10/18/16 0544 10/20/16 0548  BILITOT 0.8 2.7* 2.1* 1.3*  AST 187* 260* 117* 69*  ALT 241* 224* 161* 83*  ALKPHOS 521* 581* 466* 305*  PROT 7.2 6.3* 6.3* 6.2*  ALBUMIN 2.2* 1.8* 1.9* 1.6*    Assessment and Plan:  Acute Cholecystitis Patient is s/p percutaneous cholecystomy tube placement by Dr. Kathlene Cote (11/28).  Continue with routine drain care and 10 mL BID flushes. Trend lab results. IR to follow.  Patient may discharge with drain in place when medically ready.   Electronically Signed: Docia Barrier 10/20/2016, 1:55 PM   I spent a total of 15 Minutes at the the patient's bedside AND on the patient's hospital floor or unit, greater than 50% of which was counseling/coordinating care for acute cholecystitis s/p percutaneous cholecystostomy tube placement.

## 2016-10-20 NOTE — Progress Notes (Signed)
ANTICOAGULATION CONSULT NOTE - Follow Up Consult  Pharmacy Consult for Heparin Indication: DVT  No Known Allergies  Patient Measurements: Height: 5\' 6"  (167.6 cm) Weight: 196 lb 13.9 oz (89.3 kg) IBW/kg (Calculated) : 63.8 Heparin Dosing Weight: 79 kg  Vital Signs: Temp: 98 F (36.7 C) (11/29 1000) Temp Source: Oral (11/29 1000) BP: 136/68 (11/29 1000) Pulse Rate: 105 (11/29 1000)  Labs:  Recent Labs  10/18/16 0544  10/19/16 0405 10/20/16 0548 10/20/16 1443  HGB 10.2*  --  9.2* 8.8*  --   HCT 30.9*  --  28.8* 27.3*  --   PLT 370  --  354 393  --   APTT 88*  < > 67* 61* 81*  HEPARINUNFRC >2.20*  --  >2.20* 1.94*  --   CREATININE 1.81*  --   --  2.03*  --   < > = values in this interval not displayed.  Estimated Creatinine Clearance: 31.9 mL/min (by C-G formula based on SCr of 2.03 mg/dL (H)).  Assessment: 75 yo M with new LLE DVT (diagnosed 11/23).  The patient was started on Heparin (11/21-11/24) then transitioned to apixaban (11/24-11/26) and now back to heparin (11/26 - now) due to possible need for surgery. The patient was determined to be too unstable for surgery at this time so a perc cholecystostomy was placed 11/28 in IR.  Heparin restarted post IR.   Initial heparin level elevated d/t apixaban affects and aPTT was low which required a heparin rate increase. Heparin level now in therapeutic range at 81 seconds on IV heparin running at 1500 units/hr. Hgb and platelets stable, no bleeding noted.  Goal of Therapy:  Heparin level 0.3-0.7 units/ml aPTT 66-102 seconds Monitor platelets by anticoagulation protocol: Yes   Plan:  Continue heparin infusion at 1500 units/hr Confirmatory aPTT in 8 hours at midnight Daily heparin level and aPTT until correlating Daily CBC Follow for transition back to PO anticoagulation  Duanne Duchesne D. Solina Heron, PharmD, BCPS Clinical Pharmacist Pager: [phone] 10/20/2016 4:13 PM

## 2016-10-20 NOTE — Progress Notes (Signed)
ANTICOAGULATION CONSULT NOTE - Follow Up Consult  Pharmacy Consult for Heparin Indication: DVT  No Known Allergies  Patient Measurements: Height: 5\' 6"  (167.6 cm) Weight: 196 lb 13.9 oz (89.3 kg) IBW/kg (Calculated) : 63.8 Heparin Dosing Weight: 79 kg  Vital Signs: Temp: 99.9 F (37.7 C) (11/29 0430) Temp Source: Oral (11/29 0430) BP: 131/65 (11/29 0430) Pulse Rate: 95 (11/29 0430)  Labs:  Recent Labs  10/17/16 0917 10/18/16 0544  10/18/16 2210 10/19/16 0405 10/20/16 0548  HGB 10.3* 10.2*  --   --  9.2* 8.8*  HCT 31.3* 30.9*  --   --  28.8* 27.3*  PLT 390 370  --   --  354 393  APTT  --  88*  < > 106* 67* 61*  HEPARINUNFRC  --  >2.20*  --   --  >2.20* 1.94*  CREATININE 1.43* 1.81*  --   --   --  2.03*  < > = values in this interval not displayed.  Estimated Creatinine Clearance: 31.9 mL/min (by C-G formula based on SCr of 2.03 mg/dL (H)).  Assessment: 75 yo M with new LLE DVT (diagnosed 11/23).  The patient was started on Heparin (11/21-11/24) then transitioned to apixaban (11/24-11/26) and now back to heparin (11/26 - now) due to possible need for surgery.  The patient was determined to be too unstable for surgery at this time so a perc cholecystostomy was placed 11/28 in IR.  Heparin restarted post IR. Apixaban still affecting heparin level (1.94) so utilizing PTT to monitor heparin. PTT subtherapeutic (61 sec) on 1350 units/hr. Hgb down a bit, plt ok. No bleeding noted. No issues with infusion per RN.   Goal of Therapy:  Heparin level 0.3-0.7 units/ml aPTT 66-102 seconds Monitor platelets by anticoagulation protocol: Yes   Plan:  Increase heparin infusion to 1500 units/hr Will f/u PTT in 8 hours  Sherlon Handing, PharmD, BCPS Clinical pharmacist, pager [phone] 10/20/2016 7:05 AM

## 2016-10-20 NOTE — Progress Notes (Signed)
PROGRESS NOTE [address]   FAHAD LADER Sr.   [id number] DOB: 10-Apr-1941  DOA: 10/12/2016 PCP: [name], MD   Brief Narrative:  75 year old African-American male with a past medical history of hypertension, diabetes, presented to the hospital with complaints of leg swelling and inability to walk. Patient was recently hospitalized to [facility] from November 16 November 20 for acute renal failure. He was given IV fluids. He went home and noted that his legs were swollen. His Lasix was not resumed. He found it difficult to ambulate so he presented to the hospital here. He was hospitalized for further management. Patient diuresed well. He was also found to have acute DVT for which he was placed on anticoagulation. His LFTs were improving, but then he started spiking fever. He was seen by gastroenterology. Subsequently, general surgery was consulted as well for suspected cholecystitis.  Subjective: Patient seen and examined, feeling much better today has no complaints, but still c/o weakness. Status post percutaneous cholecystomy, tubes in place Denies nausea and vomiting and abdominal pain.  Assessment & Plan: Escherichia coli bacteremia and sepsis - pansensitive, sepsis has resolved Patient spiked temperature of 103 while he is in the hospital, blood cultures showed Escherichia coli.d. Treated with IV fluids and antibiotics,  Repeated blood cultures so far no growth Pansensitive Escherichia coli, can be discharged on oral amoxicillin or Cipro for total of 2 weeks from the day of second blood cultures, 10/16/2016 - 10/30/2016  Acute cholecystitis, status post percutaneous cholecystectomy with tubes in place.  Shelli with Fever, transaminitis and jaundice, started on Zosyn. Abdominal ultrasound showed multiple gallstones. HIDA scan showing cystic duct obstruction consistent with acute cholecystitis. Draining blood-tinged bile 75 ml IR recommendations appreciated,  can be d/c with drains   Transaminitis - trending down  Likely secondary to acute cholecystitis  Acute Distal left lower extremity DVT -Likely due to recent hospitalization and sedentary status. Initially placed on IV heparin. Transitioned to Eliquis. Can resume Eliquis tonight   Bilateral lower extremity edema With gait disturbance Patient's inability to ambulate, appears to have been secondary to his edema rather than a neurological process. Continues to be weak, PT recommended SNF.  Elevated troponin/abnormal EKG Patient with nonspecific changes on EKG. No acute ischemic changes noted. Patient denies any chest pains. Echocardiogram does not show any wall motion abnormalities. No further work up at this time  Essential hypertension- stable  Continue current management   Diabetes mellitus type 2 with hyperglycemia Continue SSI  Monitor CBG's Holding oral medications will resume upon discharge   Chronic kidney disease stage 3 - slight worse today, may dehydrated from procedure  Bolus 500 ml  Monitor BMP in AM   Hyperlipidemia Continue to hold simvastatin due to elevated transaminases  Hypoalbuminemia Question of poor nutrition contributing in part to peripheral edema.   Normocytic anemia. Anemia panel reviewed. No clear deficiencies identified. No evidence for overt bleeding.   DVT prophylaxis: Eliquis  Code Status: Full  Family Communication: None at bedside  Disposition Plan: Anticipate discharge to SNF in 24 hrs   Consultants:   Surgery   IR   Procedures:   Percutaneous Cholecystectomy   Antimicrobials:   Zosyn 11/25   Objective: Vitals:   10/19/16 2300 10/20/16 0200 10/20/16 0430 10/20/16 1000  BP:   131/65 136/68  Pulse:   95 (!) 105  Resp:   20 20  Temp: 100.2 F (37.9 C) 98.4 F (36.9 C) 99.9 F (37.7 C) 98 F (36.7 C)  TempSrc: Oral  Oral Oral Oral  SpO2:   94% 96%  Weight:      Height:        Intake/Output Summary (Last 24  hours) at 10/20/16 1528 Last data filed at 10/20/16 1300  Gross per 24 hour  Intake          1092.95 ml  Output             1050 ml  Net            42.95 ml   Filed Weights   10/17/16 2054 10/18/16 2120 10/19/16 2055  Weight: 88.2 kg (194 lb 7.1 oz) 79 kg (174 lb 1.6 oz) 89.3 kg (196 lb 13.9 oz)    Examination:  General exam: Appears calm and comfortable  Respiratory system: CTA, no wheezing  Cardiovascular system: S1 & S2 heard, RRR. No JVD, murmurs, rubs or gallops Gastrointestinal system: Mild distended, drainage in place, blood bile.  Extremities: 1+ pedal edema Psychiatry: Judgement and insight appear normal. Mood & affect appropriate.   Data Reviewed: I have personally reviewed following labs and imaging studies  CBC:  Recent Labs Lab 10/15/16 0335 10/17/16 0917 10/18/16 0544 10/19/16 0405 10/20/16 0548  WBC 11.1* 21.0* 21.8* 17.2* 18.0*  NEUTROABS  --  18.5* 18.8*  --   --   HGB 10.8* 10.3* 10.2* 9.2* 8.8*  HCT 33.1* 31.3* 30.9* 28.8* 27.3*  MCV 88.3 87.9 88.3 88.9 88.9  PLT 374 390 370 354 [id number]   Basic Metabolic Panel:  Recent Labs Lab 10/15/16 0335 10/16/16 0507 10/17/16 0917 10/18/16 0544 10/20/16 0548  NA 136 137 128* 135 133*  K 4.2 4.2 3.6 3.8 3.7  CL 99* 98* 90* 97* 94*  CO2 26 27 27 26 26   GLUCOSE 144* 181* 179* 150* 216*  BUN 18 26* 18 24* 33*  CREATININE 1.36* 1.42* 1.43* 1.81* 2.03*  CALCIUM 8.0* 8.6* 7.7* 8.0* 7.9*   GFR: Estimated Creatinine Clearance: 31.9 mL/min (by C-G formula based on SCr of 2.03 mg/dL (H)). Liver Function Tests:  Recent Labs Lab 10/15/16 0335 10/16/16 0507 10/17/16 0917 10/18/16 0544 10/20/16 0548  AST 124* 187* 260* 117* 69*  ALT 255* 241* 224* 161* 83*  ALKPHOS 388* 521* 581* 466* 305*  BILITOT 0.7 0.8 2.7* 2.1* 1.3*  PROT 6.2* 7.2 6.3* 6.3* 6.2*  ALBUMIN 2.0* 2.2* 1.8* 1.9* 1.6*   No results for input(s): LIPASE, AMYLASE in the last 168 hours. No results for input(s): AMMONIA in the last 168  hours. Coagulation Profile: No results for input(s): INR, PROTIME in the last 168 hours. Cardiac Enzymes: No results for input(s): CKTOTAL, CKMB, CKMBINDEX, TROPONINI in the last 168 hours. BNP (last 3 results) No results for input(s): PROBNP in the last 8760 hours. HbA1C: No results for input(s): HGBA1C in the last 72 hours. CBG:  Recent Labs Lab 10/19/16 1150 10/19/16 1657 10/19/16 2056 10/20/16 0728 10/20/16 1135  GLUCAP 140* 138* 153* 188* 252*   Lipid Profile: No results for input(s): CHOL, HDL, LDLCALC, TRIG, CHOLHDL, LDLDIRECT in the last 72 hours. Thyroid Function Tests: No results for input(s): TSH, T4TOTAL, FREET4, T3FREE, THYROIDAB in the last 72 hours. Anemia Panel: No results for input(s): VITAMINB12, FOLATE, FERRITIN, TIBC, IRON, RETICCTPCT in the last 72 hours. Sepsis Labs:  Recent Labs Lab 10/16/16 2200  LATICACIDVEN 0.9    Recent Results (from the past 240 hour(s))  Culture, blood (routine x 2)     Status: None (Preliminary result)   Collection Time: 10/16/16 10:16 PM  Result  Value Ref Range Status   Specimen Description BLOOD LEFT HAND  Final   Special Requests BOTTLES DRAWN AEROBIC AND ANAEROBIC 5ML  Final   Culture NO GROWTH 3 DAYS  Final   Report Status PENDING  Incomplete  Culture, blood (routine x 2)     Status: Abnormal   Collection Time: 10/16/16 10:25 PM  Result Value Ref Range Status   Specimen Description BLOOD RIGHT HAND  Final   Special Requests IN PEDIATRIC BOTTLE 3ML  Final   Culture  Setup Time   Final    GRAM NEGATIVE RODS IN PEDIATRIC BOTTLE CRITICAL RESULT CALLED TO, READ BACK BY AND VERIFIED WITH: C BALL,PHARMD AT [id number] 10/17/16 BY L BENFIELD    Culture ESCHERICHIA COLI (A)  Final   Report Status 10/19/2016 FINAL  Final   Organism ID, Bacteria ESCHERICHIA COLI  Final      Susceptibility   Escherichia coli - MIC*    AMPICILLIN <=2 SENSITIVE Sensitive     CEFAZOLIN <=4 SENSITIVE Sensitive     CEFEPIME <=1 SENSITIVE Sensitive      CEFTAZIDIME <=1 SENSITIVE Sensitive     CEFTRIAXONE <=1 SENSITIVE Sensitive     CIPROFLOXACIN <=0.25 SENSITIVE Sensitive     GENTAMICIN <=1 SENSITIVE Sensitive     IMIPENEM <=0.25 SENSITIVE Sensitive     TRIMETH/SULFA <=20 SENSITIVE Sensitive     AMPICILLIN/SULBACTAM <=2 SENSITIVE Sensitive     PIP/TAZO <=4 SENSITIVE Sensitive     Extended ESBL NEGATIVE Sensitive     * ESCHERICHIA COLI  Blood Culture ID Panel (Reflexed)     Status: Abnormal   Collection Time: 10/16/16 10:25 PM  Result Value Ref Range Status   Enterococcus species NOT DETECTED NOT DETECTED Final   Listeria monocytogenes NOT DETECTED NOT DETECTED Final   Staphylococcus species NOT DETECTED NOT DETECTED Final   Staphylococcus aureus NOT DETECTED NOT DETECTED Final   Streptococcus species NOT DETECTED NOT DETECTED Final   Streptococcus agalactiae NOT DETECTED NOT DETECTED Final   Streptococcus pneumoniae NOT DETECTED NOT DETECTED Final   Streptococcus pyogenes NOT DETECTED NOT DETECTED Final   Acinetobacter baumannii NOT DETECTED NOT DETECTED Final   Enterobacteriaceae species DETECTED (A) NOT DETECTED Final    Comment: CRITICAL RESULT CALLED TO, READ BACK BY AND VERIFIED WITH: C BALL,PHARMD AT 1405 10/17/16 BY L BENFIELD    Enterobacter cloacae complex NOT DETECTED NOT DETECTED Final   Escherichia coli DETECTED (A) NOT DETECTED Final    Comment: CRITICAL RESULT CALLED TO, READ BACK BY AND VERIFIED WITH: C BALL,PHARMD AT 1405 10/17/16 BY L BENFIELD    Klebsiella oxytoca NOT DETECTED NOT DETECTED Final   Klebsiella pneumoniae NOT DETECTED NOT DETECTED Final   Proteus species NOT DETECTED NOT DETECTED Final   Serratia marcescens NOT DETECTED NOT DETECTED Final   Carbapenem resistance NOT DETECTED NOT DETECTED Final   Haemophilus influenzae NOT DETECTED NOT DETECTED Final   Neisseria meningitidis NOT DETECTED NOT DETECTED Final   Pseudomonas aeruginosa NOT DETECTED NOT DETECTED Final   Candida albicans NOT  DETECTED NOT DETECTED Final   Candida glabrata NOT DETECTED NOT DETECTED Final   Candida krusei NOT DETECTED NOT DETECTED Final   Candida parapsilosis NOT DETECTED NOT DETECTED Final   Candida tropicalis NOT DETECTED NOT DETECTED Final  Aerobic/Anaerobic Culture (surgical/deep wound)     Status: None (Preliminary result)   Collection Time: 10/19/16  4:28 PM  Result Value Ref Range Status   Specimen Description GALL BLADDER  Final   Special Requests Normal  Final   Gram Stain   Final    ABUNDANT WBC PRESENT,BOTH PMN AND MONONUCLEAR NO ORGANISMS SEEN    Culture FEW GRAM NEGATIVE RODS  Final   Report Status PENDING  Incomplete     Radiology Studies: Ir Perc Cholecystostomy  Result Date: 10/19/2016 CLINICAL DATA:  Cholecystitis and need for percutaneous cholecystostomy due to poor candidacy for immediate cholecystectomy currently. EXAM: PERCUTANEOUS CHOLECYSTOSTOMY COMPARISON:  CT of the abdomen on 10/13/2016 and ultrasound on 10/17/2016. ANESTHESIA/SEDATION: 2.0 mg IV Versed; 100 mcg IV Fentanyl. Total Moderate Sedation Time 13 minutes. The patient's level of consciousness and physiologic status were continuously monitored during the procedure by Radiology nursing. CONTRAST:  10 mL Isovue-300 MEDICATIONS: No additional medications. FLUOROSCOPY TIME:  1 minutes and 12 seconds.  13 mGy. PROCEDURE: The procedure, risks, benefits, and alternatives were explained to the patient. Questions regarding the procedure were encouraged and answered. The patient understands and consents to the procedure. A time-out was performed prior to initiating the procedure. The right abdominal wall was prepped with chlorhexidine in a sterile fashion, and a sterile drape was applied covering the operative field. A sterile gown and sterile gloves were used for the procedure. Local anesthesia was provided with 1% Lidocaine. Ultrasound image documentation was performed. Fluoroscopy during the procedure and fluoro spot  radiograph confirms appropriate catheter position. Ultrasound was utilized to localize the gallbladder. Under direct ultrasound guidance, a 21 gauge needle was advanced via a transhepatic approach into the gallbladder lumen. Aspiration was performed and a bile sample sent for culture studies. A small amount of diluted contrast material was injected. A guide wire was then advanced into the gallbladder. A transitional dilator was placed. Percutaneous tract dilatation was then performed over a guide wire to 10-French. A 10-French pigtail drainage catheter was then advanced into the gallbladder lumen under fluoroscopy. Catheter was formed and injected with contrast material to confirm position. The catheter was flushed and connected to a gravity drainage bag. It was secured at the skin with a Prolene retention suture and Stat-Lock device. COMPLICATIONS: None FINDINGS: After needle puncture of the gallbladder, a bile sample was aspirated and sent for culture. The cholecystostomy tube was advanced into the gallbladder lumen and formed. It is now draining bile. This tube will be left to gravity drainage. IMPRESSION: Percutaneous cholecystostomy with placement of 10-French drainage catheter into the gallbladder lumen. This was left to gravity drainage. Electronically Signed   By: [name] M.D.   On: 10/19/2016 17:11     Scheduled Meds: . aspirin EC  81 mg Oral Daily  . furosemide  40 mg Oral Daily  . hydrALAZINE  25 mg Oral QPM  . insulin aspart  0-5 Units Subcutaneous QHS  . insulin aspart  0-9 Units Subcutaneous TID WC  . lisinopril  20 mg Oral QPM  . piperacillin-tazobactam (ZOSYN)  IV  3.375 g Intravenous Q8H  . verapamil  240 mg Oral QHS   Continuous Infusions: . heparin 1,500 Units/hr (10/20/16 0717)     LOS: 7 days   Chipper Oman, MD [facility] Pager [phone]  If 7PM-7AM, please contact night-coverage [email or web address] Password [email or web address] 10/20/2016, 3:28 PM

## 2016-10-20 NOTE — Progress Notes (Signed)
Subjective: No abd pain, tol diet, no n/v, perc chole yesterday  Objective: Vital signs in last 24 hours: Temp:  [98.4 F (36.9 C)-102.2 F (39 C)] 99.9 F (37.7 C) (11/29 0430) Pulse Rate:  [92-103] 95 (11/29 0430) Resp:  [15-25] 20 (11/29 0430) BP: (125-151)/(59-74) 131/65 (11/29 0430) SpO2:  [90 %-97 %] 94 % (11/29 0430) Weight:  [89.3 kg (196 lb 13.9 oz)] 89.3 kg (196 lb 13.9 oz) (11/28 2055) Last BM Date: 10/18/16  Intake/Output from previous day: 11/28 0701 - 11/29 0700 In: 1083 [P.O.:960; I.V.:113] Out: 950 [Urine:875; Drains:75] Intake/Output this shift: Total I/O In: 240 [P.O.:240] Out: -   GI: chole tube with old bloody fluid, soft nontender abdomen  Lab Results:   Recent Labs  10/19/16 0405 10/20/16 0548  WBC 17.2* 18.0*  HGB 9.2* 8.8*  HCT 28.8* 27.3*  PLT 354 393   BMET  Recent Labs  10/18/16 0544 10/20/16 0548  NA 135 133*  K 3.8 3.7  CL 97* 94*  CO2 26 26  GLUCOSE 150* 216*  BUN 24* 33*  CREATININE 1.81* 2.03*  CALCIUM 8.0* 7.9*   PT/INR No results for input(s): LABPROT, INR in the last 72 hours. ABG No results for input(s): PHART, HCO3 in the last 72 hours.  Invalid input(s): PCO2, PO2  Studies/Results: Ir Perc Cholecystostomy  Result Date: 10/19/2016 CLINICAL DATA:  Cholecystitis and need for percutaneous cholecystostomy due to poor candidacy for immediate cholecystectomy currently. EXAM: PERCUTANEOUS CHOLECYSTOSTOMY COMPARISON:  CT of the abdomen on 10/13/2016 and ultrasound on 10/17/2016. ANESTHESIA/SEDATION: 2.0 mg IV Versed; 100 mcg IV Fentanyl. Total Moderate Sedation Time 13 minutes. The patient's level of consciousness and physiologic status were continuously monitored during the procedure by Radiology nursing. CONTRAST:  10 mL Isovue-300 MEDICATIONS: No additional medications. FLUOROSCOPY TIME:  1 minutes and 12 seconds.  13 mGy. PROCEDURE: The procedure, risks, benefits, and alternatives were explained to the patient.  Questions regarding the procedure were encouraged and answered. The patient understands and consents to the procedure. A time-out was performed prior to initiating the procedure. The right abdominal wall was prepped with chlorhexidine in a sterile fashion, and a sterile drape was applied covering the operative field. A sterile gown and sterile gloves were used for the procedure. Local anesthesia was provided with 1% Lidocaine. Ultrasound image documentation was performed. Fluoroscopy during the procedure and fluoro spot radiograph confirms appropriate catheter position. Ultrasound was utilized to localize the gallbladder. Under direct ultrasound guidance, a 21 gauge needle was advanced via a transhepatic approach into the gallbladder lumen. Aspiration was performed and a bile sample sent for culture studies. A small amount of diluted contrast material was injected. A guide wire was then advanced into the gallbladder. A transitional dilator was placed. Percutaneous tract dilatation was then performed over a guide wire to 10-French. A 10-French pigtail drainage catheter was then advanced into the gallbladder lumen under fluoroscopy. Catheter was formed and injected with contrast material to confirm position. The catheter was flushed and connected to a gravity drainage bag. It was secured at the skin with a Prolene retention suture and Stat-Lock device. COMPLICATIONS: None FINDINGS: After needle puncture of the gallbladder, a bile sample was aspirated and sent for culture. The cholecystostomy tube was advanced into the gallbladder lumen and formed. It is now draining bile. This tube will be left to gravity drainage. IMPRESSION: Percutaneous cholecystostomy with placement of 10-French drainage catheter into the gallbladder lumen. This was left to gravity drainage. Electronically Signed   By: Eulas Post  Kathlene Cote M.D.   On: 10/19/2016 17:11    Anti-infectives: Anti-infectives    Start     Dose/Rate Route Frequency  Ordered Stop   10/19/16 0915  ertapenem (INVANZ) 1 g in sodium chloride 0.9 % 50 mL IVPB  Status:  Discontinued     1 g 100 mL/hr over 30 Minutes Intravenous Every 24 hours 10/19/16 0909 10/19/16 0910   10/17/16 0400  piperacillin-tazobactam (ZOSYN) IVPB 3.375 g     3.375 g 12.5 mL/hr over 240 Minutes Intravenous Every 8 hours 10/16/16 2122     10/16/16 2200  piperacillin-tazobactam (ZOSYN) IVPB 3.375 g     3.375 g 100 mL/hr over 30 Minutes Intravenous  Once 10/16/16 2122 10/16/16 2226      Assessment/Plan: Acute cholecystitis, DVT - placed perc chole yesterday as I think delayed surgery would be better, he has no pain, wbc still high, lfts better, febrile overnight - hopefully continues to improve on abx with drain, will continue to follow - can stay on diet - recheck labs in am - if gets better will need follow up in drain clinic and with me 3-4 weeks after dc  [facility] 10/20/2016

## 2016-10-21 ENCOUNTER — Inpatient Hospital Stay (HOSPITAL_COMMUNITY): Payer: Commercial Managed Care - HMO

## 2016-10-21 DIAGNOSIS — K830 Cholangitis: Secondary | ICD-10-CM

## 2016-10-21 LAB — CBC
HEMATOCRIT: 26.1 % — AB (ref 39.0–52.0)
Hemoglobin: 8.3 g/dL — ABNORMAL LOW (ref 13.0–17.0)
MCH: 28.3 pg (ref 26.0–34.0)
MCHC: 31.8 g/dL (ref 30.0–36.0)
MCV: 89.1 fL (ref 78.0–100.0)
PLATELETS: 458 10*3/uL — AB (ref 150–400)
RBC: 2.93 MIL/uL — ABNORMAL LOW (ref 4.22–5.81)
RDW: 14.7 % (ref 11.5–15.5)
WBC: 17.4 10*3/uL — AB (ref 4.0–10.5)

## 2016-10-21 LAB — COMPREHENSIVE METABOLIC PANEL
ALT: 72 U/L — AB (ref 17–63)
ANION GAP: 11 (ref 5–15)
AST: 62 U/L — ABNORMAL HIGH (ref 15–41)
Albumin: 1.6 g/dL — ABNORMAL LOW (ref 3.5–5.0)
Alkaline Phosphatase: 257 U/L — ABNORMAL HIGH (ref 38–126)
BUN: 36 mg/dL — ABNORMAL HIGH (ref 6–20)
CHLORIDE: 97 mmol/L — AB (ref 101–111)
CO2: 27 mmol/L (ref 22–32)
CREATININE: 2.37 mg/dL — AB (ref 0.61–1.24)
Calcium: 7.9 mg/dL — ABNORMAL LOW (ref 8.9–10.3)
GFR, EST AFRICAN AMERICAN: 29 mL/min — AB (ref >60)
GFR, EST NON AFRICAN AMERICAN: 25 mL/min — AB (ref >60)
Glucose, Bld: 174 mg/dL — ABNORMAL HIGH (ref 65–99)
Potassium: 4.1 mmol/L (ref 3.5–5.1)
SODIUM: 135 mmol/L (ref 135–145)
Total Bilirubin: 1.2 mg/dL (ref 0.3–1.2)
Total Protein: 6.6 g/dL (ref 6.5–8.1)

## 2016-10-21 LAB — GLUCOSE, CAPILLARY
GLUCOSE-CAPILLARY: 189 mg/dL — AB (ref 65–99)
GLUCOSE-CAPILLARY: 249 mg/dL — AB (ref 65–99)
GLUCOSE-CAPILLARY: 198 mg/dL — AB (ref 65–99)
Glucose-Capillary: 224 mg/dL — ABNORMAL HIGH (ref 65–99)

## 2016-10-21 LAB — APTT
APTT: 60 s — AB (ref 24–36)
APTT: 69 s — AB (ref 24–36)
aPTT: 86 seconds — ABNORMAL HIGH (ref 24–36)

## 2016-10-21 LAB — HEPARIN LEVEL (UNFRACTIONATED): HEPARIN UNFRACTIONATED: 1.34 [IU]/mL — AB (ref 0.30–0.70)

## 2016-10-21 MED ORDER — SODIUM CHLORIDE 0.9 % IV SOLN
3.0000 g | Freq: Two times a day (BID) | INTRAVENOUS | Status: DC
  Administered 2016-10-21 – 2016-10-23 (×4): 3 g via INTRAVENOUS
  Filled 2016-10-21 (×5): qty 3

## 2016-10-21 MED ORDER — SODIUM CHLORIDE 0.9% FLUSH
10.0000 mL | Freq: Two times a day (BID) | INTRAVENOUS | Status: DC
  Administered 2016-10-21 – 2016-10-22 (×2): 10 mL

## 2016-10-21 MED ORDER — SODIUM CHLORIDE 0.9% FLUSH
10.0000 mL | INTRAVENOUS | Status: DC | PRN
  Administered 2016-10-23: 20 mL
  Administered 2016-10-24: 10 mL
  Filled 2016-10-21 (×2): qty 40

## 2016-10-21 NOTE — Progress Notes (Signed)
Pharmacy Antibiotic Note  Stephen MAZZOLI Sr. is a 75 y.o. male admitted on 10/12/2016 with EColi (pan-sensitive) cholecystitis.  Pharmacy has been consulted for narrowing from Zosyn to Unasyn dosing.  CrCl 20-46ml/min, will adjust dosing  Plan: Unasyn 3g IV q12 F/U plans to convert to po antibiotics  Height: 5\' 6"  (167.6 cm) Weight: 199 lb 1.2 oz (90.3 kg) IBW/kg (Calculated) : 63.8  Temp (24hrs), Avg:100.2 F (37.9 C), Min:99.2 F (37.3 C), Max:101.5 F (38.6 C)   Recent Labs Lab 10/16/16 0507 10/16/16 2200 10/17/16 0917 10/18/16 0544 10/19/16 0405 10/20/16 0548 10/21/16 0603  WBC  --   --  21.0* 21.8* 17.2* 18.0* 17.4*  CREATININE 1.42*  --  1.43* 1.81*  --  2.03* 2.37*  LATICACIDVEN  --  0.9  --   --   --   --   --     Estimated Creatinine Clearance: 27.5 mL/min (by C-G formula based on SCr of 2.37 mg/dL (H)).    No Known Allergies  Antimicrobials this admission: Zosyn 11/25 >> 11/30 Unasyn 11/30 >>  Dose adjustments this admission: N/a  Microbiology results: 11/25 blood: 1/2 EColi- pan sensitive 11/25 BCID + E.coli  Thank you for allowing pharmacy to be a part of this patient's care.  Jaquita Folds 10/21/2016 3:39 PM

## 2016-10-21 NOTE — Consult Note (Signed)
   [facility] CM Inpatient Consult   10/21/2016  Stephen GANNETT Sr. 1940/12/07 [id number] Came by to speak with patient and Physical Therapy was with him.  Will follow up at a later time,  Patient eligible for [facility] Care Management.  He has [employer].  Will follow up.  Natividad Brood, RN BSN [facility] Liaison  [phone] business mobile phone Toll free office [phone]

## 2016-10-21 NOTE — Progress Notes (Signed)
Inpatient Diabetes Program Recommendations  AACE/ADA: New Consensus Statement on Inpatient Glycemic Control (2015)  Target Ranges:  Prepandial:   less than 140 mg/dL      Peak postprandial:   less than 180 mg/dL (1-2 hours)      Critically ill patients:  140 - 180 mg/dL   Results for ANEUDY, BAILEY SR. (MRN [id number]) as of 10/21/2016 09:25  Ref. Range 10/20/2016 07:28 10/20/2016 11:35 10/20/2016 16:18 10/20/2016 21:01 10/21/2016 07:44  Glucose-Capillary Latest Ref Range: 65 - 99 mg/dL 188 (H) 252 (H) 239 (H) 268 (H) 189 (H)   Review of Glycemic Control  Diabetes history: DM 2 Outpatient Diabetes medications: Glipizide 10 mg Daily, Metformin 1,000 mg BID Current orders for Inpatient glycemic control: Novolog Sensitive + HS scale  Inpatient Diabetes Program Recommendations:  Glucose increases in the 200's during the day. Consider increasing Correction to Novolog Moderate (0-15 units) TID. May need low dose meal coverage if glucose remains elevated after correction scale increase.  Thanks,  Tama Headings RN, MSN, [facility] Inpatient Diabetes Coordinator Team Pager [phone] (8a-5p)

## 2016-10-21 NOTE — Progress Notes (Signed)
ANTICOAGULATION CONSULT NOTE - Follow Up Consult  Pharmacy Consult for Heparin Indication: DVT  No Known Allergies  Patient Measurements: Height: 5\' 6"  (167.6 cm) Weight: 199 lb 1.2 oz (90.3 kg) IBW/kg (Calculated) : 63.8 Heparin Dosing Weight: 79 kg  Vital Signs: Temp: 98 F (36.7 C) (11/30 1726) Temp Source: Oral (11/30 1726) BP: 145/66 (11/30 1726) Pulse Rate: 104 (11/30 1726)  Labs:  Recent Labs  10/19/16 0405 10/20/16 0548 10/20/16 1443 10/21/16 0034 10/21/16 0603 10/21/16 1828  HGB 9.2* 8.8*  --   --  8.3*  --   HCT 28.8* 27.3*  --   --  26.1*  --   PLT 354 393  --   --  458*  --   APTT 67* 61* 81* 86* 60* 69*  HEPARINUNFRC >2.20* 1.94* 1.84*  --  1.34*  --   CREATININE  --  2.03*  --   --  2.37*  --     Estimated Creatinine Clearance: 27.5 mL/min (by C-G formula based on SCr of 2.37 mg/dL (H)).  Assessment: 75 yo M with new LLE DVT (diagnosed 11/23).  The patient was started on Heparin (11/21-11/24) then transitioned to apixaban (11/24-11/26) and now back to heparin (11/26 - now) due to possible need for surgery. The patient was determined to be too unstable for surgery at this time so a perc cholecystostomy was completed 11/28 in IR.  Heparin restarted post IR.   APTT was therapeutic x2 on heparin at rate of 1500 units/hr, but  dropped to below goal. Rate was increased but aPTT still near lower end of goal. Heparin level not correlating with aPTT at this time d/t effects of apixaban. Hgb and platelets stable, no bleeding noted.  Goal of Therapy:  Heparin level 0.3-0.7 units/ml aPTT 66-102 seconds Monitor platelets by anticoagulation protocol: Yes   Plan:  Increase heparin infusion to 1700 units/hr Daily heparin level and aPTT until correlating Daily CBC Follow for transition back to PO anticoagulation  Georga Bora, PharmD Clinical Pharmacist Pager: [phone] 10/21/2016 7:53 PM

## 2016-10-21 NOTE — Progress Notes (Addendum)
ANTICOAGULATION CONSULT NOTE - Follow Up Consult  Pharmacy Consult for Heparin Indication: DVT  No Known Allergies  Patient Measurements: Height: 5\' 6"  (167.6 cm) Weight: 199 lb 1.2 oz (90.3 kg) IBW/kg (Calculated) : 63.8 Heparin Dosing Weight: 79 kg  Vital Signs: Temp: 99.7 F (37.6 C) (11/30 0453) Temp Source: Oral (11/30 0453) BP: 123/63 (11/30 0453) Pulse Rate: 99 (11/30 0453)  Labs:  Recent Labs  10/19/16 0405 10/20/16 0548 10/20/16 1443 10/21/16 0034 10/21/16 0603  HGB 9.2* 8.8*  --   --  8.3*  HCT 28.8* 27.3*  --   --  26.1*  PLT 354 393  --   --  458*  APTT 67* 61* 81* 86* 60*  HEPARINUNFRC >2.20* 1.94* 1.84*  --  1.34*  CREATININE  --  2.03*  --   --  2.37*    Estimated Creatinine Clearance: 27.5 mL/min (by C-G formula based on SCr of 2.37 mg/dL (H)).  Assessment: 75 yo M with new LLE DVT (diagnosed 11/23).  The patient was started on Heparin (11/21-11/24) then transitioned to apixaban (11/24-11/26) and now back to heparin (11/26 - now) due to possible need for surgery. The patient was determined to be too unstable for surgery at this time so a perc cholecystostomy was completed 11/28 in IR.  Heparin restarted post IR.   APTT was therapeutic x2 on heparin at rate of 1500 units/hr, but now has dropped to below goal at 60 seconds. Heparin level still elevated at 1.34 d/t effects of apixaban. Hgb and platelets stable, no bleeding noted.  Goal of Therapy:  Heparin level 0.3-0.7 units/ml aPTT 66-102 seconds Monitor platelets by anticoagulation protocol: Yes   Plan:  Increase heparin infusion to 1600 units/hr APTT at 1800 Daily heparin level and aPTT until correlating Daily CBC Follow for transition back to PO anticoagulation  Darene Nappi D. Geneva Barrero, PharmD, BCPS Clinical Pharmacist Pager: [phone] 10/21/2016 9:04 AM

## 2016-10-21 NOTE — Progress Notes (Signed)
ANTICOAGULATION CONSULT NOTE - Follow Up Consult  Pharmacy Consult for Heparin Indication: DVT  No Known Allergies  Patient Measurements: Height: 5\' 6"  (167.6 cm) Weight: 199 lb 1.2 oz (90.3 kg) IBW/kg (Calculated) : 63.8 Heparin Dosing Weight: 79 kg  Vital Signs: Temp: 99.2 F (37.3 C) (11/29 2104) Temp Source: Oral (11/29 2104) BP: 127/70 (11/29 2104) Pulse Rate: 99 (11/29 2104)  Labs:  Recent Labs  10/18/16 0544  10/19/16 0405 10/20/16 0548 10/20/16 1443 10/21/16 0034  HGB 10.2*  --  9.2* 8.8*  --   --   HCT 30.9*  --  28.8* 27.3*  --   --   PLT 370  --  354 393  --   --   APTT 88*  < > 67* 61* 81* 86*  HEPARINUNFRC >2.20*  --  >2.20* 1.94* 1.84*  --   CREATININE 1.81*  --   --  2.03*  --   --   < > = values in this interval not displayed.  Estimated Creatinine Clearance: 32.1 mL/min (by C-G formula based on SCr of 2.03 mg/dL (H)).  Assessment: 75 yo M with new LLE DVT (diagnosed 11/23).  The patient was started on Heparin (11/21-11/24) then transitioned to apixaban (11/24-11/26) and now back to heparin (11/26 - now) due to possible need for surgery. The patient was determined to be too unstable for surgery at this time so a perc cholecystostomy was placed 11/28 in IR.  Heparin restarted post IR.   PTT remains therapeutic (86 sec) on IV heparin running at 1500 units/hr. No bleeding noted.  Goal of Therapy:  Heparin level 0.3-0.7 units/ml aPTT 66-102 seconds Monitor platelets by anticoagulation protocol: Yes   Plan:  Continue heparin infusion at 1500 units/hr Daily heparin level and aPTT until correlating Daily CBC Follow for transition back to PO anticoagulation  Sherlon Handing, PharmD, BCPS Clinical pharmacist, pager [phone] 10/21/2016 1:28 AM

## 2016-10-21 NOTE — Progress Notes (Signed)
[facility] Progress Note     Subjective: Reports generalized weakness today. Decreased appetite.deneis abdominal pain, nausea, or vomiting. Denies chills/night sweats.  Objective: Vital signs in last 24 hours: Temp:  [99.2 F (37.3 C)-101.5 F (38.6 C)] 99.7 F (37.6 C) (11/30 0453) Pulse Rate:  [93-99] 99 (11/30 0453) Resp:  [20] 20 (11/30 0453) BP: (123-148)/(63-87) 123/63 (11/30 0453) SpO2:  [96 %-97 %] 97 % (11/30 0453) Weight:  [199 lb 1.2 oz (90.3 kg)] 199 lb 1.2 oz (90.3 kg) (11/29 2104) Last BM Date: 10/14/16  Intake/Output from previous day: 11/29 0701 - 11/30 0700 In: 1433.1 [P.O.:840; I.V.:373.1; IV Piggyback:200] Out: [id number] [Urine:1125; Drains:140] Intake/Output this shift: No intake/output data recorded.  PE: Gen:  Alert, NAD, pleasant Abd: Soft, NT, mild distention +BS, perc cholecystostomy with minimal, sanguinous drainage. GU: condom cath  Lab Results:   Recent Labs  10/20/16 0548 10/21/16 0603  WBC 18.0* 17.4*  HGB 8.8* 8.3*  HCT 27.3* 26.1*  PLT 393 458*   BMET  Recent Labs  10/20/16 0548 10/21/16 0603  NA 133* 135  K 3.7 4.1  CL 94* 97*  CO2 26 27  GLUCOSE 216* 174*  BUN 33* 36*  CREATININE 2.03* 2.37*  CALCIUM 7.9* 7.9*   PT/INR No results for input(s): LABPROT, INR in the last 72 hours. CMP     Component Value Date/Time   NA 135 10/21/2016 0603   K 4.1 10/21/2016 0603   CL 97 (L) 10/21/2016 0603   CO2 27 10/21/2016 0603   GLUCOSE 174 (H) 10/21/2016 0603   BUN 36 (H) 10/21/2016 0603   CREATININE 2.37 (H) 10/21/2016 0603   CALCIUM 7.9 (L) 10/21/2016 0603   PROT 6.6 10/21/2016 0603   ALBUMIN 1.6 (L) 10/21/2016 0603   AST 62 (H) 10/21/2016 0603   ALT 72 (H) 10/21/2016 0603   ALKPHOS 257 (H) 10/21/2016 0603   BILITOT 1.2 10/21/2016 0603   GFRNONAA 25 (L) 10/21/2016 0603   GFRAA 29 (L) 10/21/2016 0603   Lipase     Component Value Date/Time   LIPASE 36 10/07/2016 2153   Studies/Results: Ir Perc  Cholecystostomy  Result Date: 10/19/2016 CLINICAL DATA:  Cholecystitis and need for percutaneous cholecystostomy due to poor candidacy for immediate cholecystectomy currently. EXAM: PERCUTANEOUS CHOLECYSTOSTOMY COMPARISON:  CT of the abdomen on 10/13/2016 and ultrasound on 10/17/2016. ANESTHESIA/SEDATION: 2.0 mg IV Versed; 100 mcg IV Fentanyl. Total Moderate Sedation Time 13 minutes. The patient's level of consciousness and physiologic status were continuously monitored during the procedure by Radiology nursing. CONTRAST:  10 mL Isovue-300 MEDICATIONS: No additional medications. FLUOROSCOPY TIME:  1 minutes and 12 seconds.  13 mGy. PROCEDURE: The procedure, risks, benefits, and alternatives were explained to the patient. Questions regarding the procedure were encouraged and answered. The patient understands and consents to the procedure. A time-out was performed prior to initiating the procedure. The right abdominal wall was prepped with chlorhexidine in a sterile fashion, and a sterile drape was applied covering the operative field. A sterile gown and sterile gloves were used for the procedure. Local anesthesia was provided with 1% Lidocaine. Ultrasound image documentation was performed. Fluoroscopy during the procedure and fluoro spot radiograph confirms appropriate catheter position. Ultrasound was utilized to localize the gallbladder. Under direct ultrasound guidance, a 21 gauge needle was advanced via a transhepatic approach into the gallbladder lumen. Aspiration was performed and a bile sample sent for culture studies. A small amount of diluted contrast material was injected. A guide wire was then advanced into  the gallbladder. A transitional dilator was placed. Percutaneous tract dilatation was then performed over a guide wire to 10-French. A 10-French pigtail drainage catheter was then advanced into the gallbladder lumen under fluoroscopy. Catheter was formed and injected with contrast material to confirm  position. The catheter was flushed and connected to a gravity drainage bag. It was secured at the skin with a Prolene retention suture and Stat-Lock device. COMPLICATIONS: None FINDINGS: After needle puncture of the gallbladder, a bile sample was aspirated and sent for culture. The cholecystostomy tube was advanced into the gallbladder lumen and formed. It is now draining bile. This tube will be left to gravity drainage. IMPRESSION: Percutaneous cholecystostomy with placement of 10-French drainage catheter into the gallbladder lumen. This was left to gravity drainage. Electronically Signed   By: [name] M.D.   On: 10/19/2016 17:11   Dg Chest Port 1 View  Result Date: 10/21/2016 CLINICAL DATA:  Fever.  Recent gallbladder surgery . EXAM: PORTABLE CHEST 1 VIEW COMPARISON:  10/16/2016 . FINDINGS: Heart size stable. Low lung volumes with basilar atelectasis again noted. No pleural effusion or pneumothorax. IMPRESSION: Low lung volumes with basilar atelectasis again noted. Electronically Signed   By: Marcello Moores  Register   On: 10/21/2016 07:34   Anti-infectives: Anti-infectives    Start     Dose/Rate Route Frequency Ordered Stop   10/19/16 0915  ertapenem (INVANZ) 1 g in sodium chloride 0.9 % 50 mL IVPB  Status:  Discontinued     1 g 100 mL/hr over 30 Minutes Intravenous Every 24 hours 10/19/16 0909 10/19/16 0910   10/17/16 0400  piperacillin-tazobactam (ZOSYN) IVPB 3.375 g     3.375 g 12.5 mL/hr over 240 Minutes Intravenous Every 8 hours 10/16/16 2122     10/16/16 2200  piperacillin-tazobactam (ZOSYN) IVPB 3.375 g     3.375 g 100 mL/hr over 30 Minutes Intravenous  Once 10/16/16 2122 10/16/16 2226     Assessment/Plan Acute cholecystitis, DVT S/P percutaneous choelcystostomy tube placement by IR 11/28 - Cx pending; gram stain GNR and no anaerobes - WBC 17.4 from 18.0  - continue regular diet - encourage mobilization with therapies. - Will need surgical follow up with Dr. Rolm Bookbinder in  4 weeks as well as follow up per IR at the drain clinic for perc cholecystomy.     LOS: 8 days    Jill Alexanders , [facility] 10/21/2016, 10:06 AM Pager: [phone] Consults: [phone] Mon-Fri 7:00 am-4:30 pm Sat-Sun 7:00 am-11:30 am

## 2016-10-21 NOTE — Progress Notes (Signed)
PROGRESS NOTE [address]   Stephen FOLWELL Sr.   [id number] DOB: [birth year]  DOA: 10/12/2016 PCP: [name], MD   Brief Narrative:  75 year old African-American male with a past medical history of hypertension, diabetes, presented to the hospital with complaints of leg swelling and inability to walk. Patient was recently hospitalized to [facility] from November 16 November 20 for acute renal failure. He was given IV fluids. He went home and noted that his legs were swollen. His Lasix was not resumed. He found it difficult to ambulate so he presented to the hospital here. He was hospitalized for further management. Patient diuresed well. He was also found to have acute DVT for which he was placed on anticoagulation. His LFTs were improving, but then he started spiking fever. He was seen by gastroenterology. Subsequently, general surgery was consulted as well for suspected cholecystitis.  Subjective: Spike temperature yesterday Tmax 101.5. Patient was re-cultured. No new complaints this morning. Patient doing better.   Assessment & Plan: Escherichia coli bacteremia and sepsis - pansensitive, sepsis has resolved Patient spiked temperature of 103 while he is in the hospital, blood cultures showed Escherichia coli.d. Treated with IV fluids and antibiotics,  Repeated blood cultures so far no growth Pansensitive Escherichia coli, can be discharged on oral amoxicillin or Cipro for total of 2 weeks from the day of second blood cultures, 10/16/2016 - 10/30/2016  Acute cholecystitis, status post percutaneous cholecystectomy with tubes in place.  Initially with Fever, transaminitis and jaundice,  Abdominal ultrasound showed multiple gallstones. HIDA scan showing cystic duct obstruction consistent with acute cholecystitis. Draining blood-tinged bile 75 ml IR recommendations appreciated, can be d/c with drains  Gallbladder cx show E coli pansensitive  Will change zosyn for  Unasyn  If repeat blood cx have no growth will d/c to SNF when bed available   Transaminitis - trending down almost back to normal  Likely secondary to acute cholecystitis  Acute Distal left lower extremity DVT -Likely due to recent hospitalization and sedentary status. Initially placed on IV heparin.  -Hold Eliquis tonight given increase in serum Cr    Bilateral lower extremity edema With gait disturbance Patient's inability to ambulate, appears to have been secondary to his edema rather than a neurological process. Continues to be weak, PT recommended SNF.  Elevated troponin/abnormal EKG Patient with nonspecific changes on EKG. No acute ischemic changes noted. Patient denies any chest pains. Echocardiogram does not show any wall motion abnormalities. No further work up at this time  Essential hypertension- stable  Holding ACEi today  Continue hydralazine and verapamil  Monitor BP   Diabetes mellitus type 2 CBG's acceptable  Continue SSI  Monitor CBG's Holding oral medications will resume upon discharge   Chronic kidney disease stage 3 - Acute kidney injury - 2/2 dehydration in combination with ACE and Lasix  Give another bolus 500 ml  Hold ACEi and Lasix tonight  Monitor BMP in AM   Hyperlipidemia Will resume statin upon discharge    Normocytic anemia. Anemia panel reviewed. No clear deficiencies identified. No evidence for overt bleeding.  DVT prophylaxis: Eliquis  Code Status: Full  Family Communication: None at bedside  Disposition Plan: Anticipate discharge to SNF in 24 hrs   Consultants:   Surgery   IR   Procedures:   Percutaneous Cholecystectomy   Antimicrobials:   Zosyn 11/25   Objective: Vitals:   10/20/16 1732 10/20/16 2104 10/21/16 0453 10/21/16 0900  BP: (!) 148/87 127/70 123/63 128/63  Pulse: 93 99 99  80  Resp: 20 20 20 18   Temp: (!) 101.3 F (38.5 C) 99.2 F (37.3 C) 99.7 F (37.6 C) 99.5 F (37.5 C)  TempSrc: Oral Oral Oral  Oral  SpO2: 96% 96% 97% 98%  Weight:  90.3 kg (199 lb 1.2 oz)    Height:        Intake/Output Summary (Last 24 hours) at 10/21/16 1519 Last data filed at 10/21/16 1300  Gross per 24 hour  Intake          1063.08 ml  Output             1415 ml  Net          -351.92 ml   Filed Weights   10/18/16 2120 10/19/16 2055 10/20/16 2104  Weight: 79 kg (174 lb 1.6 oz) 89.3 kg (196 lb 13.9 oz) 90.3 kg (199 lb 1.2 oz)    Examination:  General exam: Appears calm and comfortable  Respiratory system: CTA, no wheezing  Cardiovascular system: S1 & S2 heard, RRR.  Gastrointestinal system: Abdomen soft NTND, drainage in place, blood bile.  Extremities: 2+ pedal edema Psychiatry: Judgement and insight appear normal. Mood & affect appropriate.   Data Reviewed: I have personally reviewed following labs and imaging studies  CBC:  Recent Labs Lab 10/17/16 0917 10/18/16 0544 10/19/16 0405 10/20/16 0548 10/21/16 0603  WBC 21.0* 21.8* 17.2* 18.0* 17.4*  NEUTROABS 18.5* 18.8*  --   --   --   HGB 10.3* 10.2* 9.2* 8.8* 8.3*  HCT 31.3* 30.9* 28.8* 27.3* 26.1*  MCV 87.9 88.3 88.9 88.9 89.1  PLT 390 370 354 393 [id number]*   Basic Metabolic Panel:  Recent Labs Lab 10/16/16 0507 10/17/16 0917 10/18/16 0544 10/20/16 0548 10/21/16 0603  NA 137 128* 135 133* 135  K 4.2 3.6 3.8 3.7 4.1  CL 98* 90* 97* 94* 97*  CO2 27 27 26 26 27   GLUCOSE 181* 179* 150* 216* 174*  BUN 26* 18 24* 33* 36*  CREATININE 1.42* 1.43* 1.81* 2.03* 2.37*  CALCIUM 8.6* 7.7* 8.0* 7.9* 7.9*   GFR: Estimated Creatinine Clearance: 27.5 mL/min (by C-G formula based on SCr of 2.37 mg/dL (H)). Liver Function Tests:  Recent Labs Lab 10/16/16 0507 10/17/16 0917 10/18/16 0544 10/20/16 0548 10/21/16 0603  AST 187* 260* 117* 69* 62*  ALT 241* 224* 161* 83* 72*  ALKPHOS 521* 581* 466* 305* 257*  BILITOT 0.8 2.7* 2.1* 1.3* 1.2  PROT 7.2 6.3* 6.3* 6.2* 6.6  ALBUMIN 2.2* 1.8* 1.9* 1.6* 1.6*   No results for input(s): LIPASE,  AMYLASE in the last 168 hours. No results for input(s): AMMONIA in the last 168 hours. Coagulation Profile: No results for input(s): INR, PROTIME in the last 168 hours. Cardiac Enzymes: No results for input(s): CKTOTAL, CKMB, CKMBINDEX, TROPONINI in the last 168 hours. BNP (last 3 results) No results for input(s): PROBNP in the last 8760 hours. HbA1C: No results for input(s): HGBA1C in the last 72 hours. CBG:  Recent Labs Lab 10/20/16 1135 10/20/16 1618 10/20/16 2101 10/21/16 0744 10/21/16 1211  GLUCAP 252* 239* 268* 189* 224*   Lipid Profile: No results for input(s): CHOL, HDL, LDLCALC, TRIG, CHOLHDL, LDLDIRECT in the last 72 hours. Thyroid Function Tests: No results for input(s): TSH, T4TOTAL, FREET4, T3FREE, THYROIDAB in the last 72 hours. Anemia Panel: No results for input(s): VITAMINB12, FOLATE, FERRITIN, TIBC, IRON, RETICCTPCT in the last 72 hours. Sepsis Labs:  Recent Labs Lab 10/16/16 2200  LATICACIDVEN 0.9    Recent Results (  from the past 240 hour(s))  Culture, blood (routine x 2)     Status: None (Preliminary result)   Collection Time: 10/16/16 10:16 PM  Result Value Ref Range Status   Specimen Description BLOOD LEFT HAND  Final   Special Requests BOTTLES DRAWN AEROBIC AND ANAEROBIC 5ML  Final   Culture NO GROWTH 3 DAYS  Final   Report Status PENDING  Incomplete  Culture, blood (routine x 2)     Status: Abnormal   Collection Time: 10/16/16 10:25 PM  Result Value Ref Range Status   Specimen Description BLOOD RIGHT HAND  Final   Special Requests IN PEDIATRIC BOTTLE 3ML  Final   Culture  Setup Time   Final    GRAM NEGATIVE RODS IN PEDIATRIC BOTTLE CRITICAL RESULT CALLED TO, READ BACK BY AND VERIFIED WITH: C BALL,PHARMD AT [id number] 10/17/16 BY L BENFIELD    Culture ESCHERICHIA COLI (A)  Final   Report Status 10/19/2016 FINAL  Final   Organism ID, Bacteria ESCHERICHIA COLI  Final      Susceptibility   Escherichia coli - MIC*    AMPICILLIN <=2 SENSITIVE  Sensitive     CEFAZOLIN <=4 SENSITIVE Sensitive     CEFEPIME <=1 SENSITIVE Sensitive     CEFTAZIDIME <=1 SENSITIVE Sensitive     CEFTRIAXONE <=1 SENSITIVE Sensitive     CIPROFLOXACIN <=0.25 SENSITIVE Sensitive     GENTAMICIN <=1 SENSITIVE Sensitive     IMIPENEM <=0.25 SENSITIVE Sensitive     TRIMETH/SULFA <=20 SENSITIVE Sensitive     AMPICILLIN/SULBACTAM <=2 SENSITIVE Sensitive     PIP/TAZO <=4 SENSITIVE Sensitive     Extended ESBL NEGATIVE Sensitive     * ESCHERICHIA COLI  Blood Culture ID Panel (Reflexed)     Status: Abnormal   Collection Time: 10/16/16 10:25 PM  Result Value Ref Range Status   Enterococcus species NOT DETECTED NOT DETECTED Final   Listeria monocytogenes NOT DETECTED NOT DETECTED Final   Staphylococcus species NOT DETECTED NOT DETECTED Final   Staphylococcus aureus NOT DETECTED NOT DETECTED Final   Streptococcus species NOT DETECTED NOT DETECTED Final   Streptococcus agalactiae NOT DETECTED NOT DETECTED Final   Streptococcus pneumoniae NOT DETECTED NOT DETECTED Final   Streptococcus pyogenes NOT DETECTED NOT DETECTED Final   Acinetobacter baumannii NOT DETECTED NOT DETECTED Final   Enterobacteriaceae species DETECTED (A) NOT DETECTED Final    Comment: CRITICAL RESULT CALLED TO, READ BACK BY AND VERIFIED WITH: C BALL,PHARMD AT 1405 10/17/16 BY L BENFIELD    Enterobacter cloacae complex NOT DETECTED NOT DETECTED Final   Escherichia coli DETECTED (A) NOT DETECTED Final    Comment: CRITICAL RESULT CALLED TO, READ BACK BY AND VERIFIED WITH: C BALL,PHARMD AT 1405 10/17/16 BY L BENFIELD    Klebsiella oxytoca NOT DETECTED NOT DETECTED Final   Klebsiella pneumoniae NOT DETECTED NOT DETECTED Final   Proteus species NOT DETECTED NOT DETECTED Final   Serratia marcescens NOT DETECTED NOT DETECTED Final   Carbapenem resistance NOT DETECTED NOT DETECTED Final   Haemophilus influenzae NOT DETECTED NOT DETECTED Final   Neisseria meningitidis NOT DETECTED NOT DETECTED Final     Pseudomonas aeruginosa NOT DETECTED NOT DETECTED Final   Candida albicans NOT DETECTED NOT DETECTED Final   Candida glabrata NOT DETECTED NOT DETECTED Final   Candida krusei NOT DETECTED NOT DETECTED Final   Candida parapsilosis NOT DETECTED NOT DETECTED Final   Candida tropicalis NOT DETECTED NOT DETECTED Final  Aerobic/Anaerobic Culture (surgical/deep wound)     Status: None (  Preliminary result)   Collection Time: 10/19/16  4:28 PM  Result Value Ref Range Status   Specimen Description GALL BLADDER  Final   Special Requests Normal  Final   Gram Stain   Final    ABUNDANT WBC PRESENT,BOTH PMN AND MONONUCLEAR NO ORGANISMS SEEN    Culture   Final    FEW ESCHERICHIA COLI NO ANAEROBES ISOLATED; CULTURE IN PROGRESS FOR 5 DAYS    Report Status PENDING  Incomplete   Organism ID, Bacteria ESCHERICHIA COLI  Final      Susceptibility   Escherichia coli - MIC*    AMPICILLIN <=2 SENSITIVE Sensitive     CEFAZOLIN <=4 SENSITIVE Sensitive     CEFEPIME <=1 SENSITIVE Sensitive     CEFTAZIDIME <=1 SENSITIVE Sensitive     CEFTRIAXONE <=1 SENSITIVE Sensitive     CIPROFLOXACIN <=0.25 SENSITIVE Sensitive     GENTAMICIN <=1 SENSITIVE Sensitive     IMIPENEM <=0.25 SENSITIVE Sensitive     TRIMETH/SULFA <=20 SENSITIVE Sensitive     AMPICILLIN/SULBACTAM <=2 SENSITIVE Sensitive     PIP/TAZO <=4 SENSITIVE Sensitive     Extended ESBL NEGATIVE Sensitive     * FEW ESCHERICHIA COLI     Radiology Studies: Ir Perc Cholecystostomy  Result Date: 10/19/2016 CLINICAL DATA:  Cholecystitis and need for percutaneous cholecystostomy due to poor candidacy for immediate cholecystectomy currently. EXAM: PERCUTANEOUS CHOLECYSTOSTOMY COMPARISON:  CT of the abdomen on 10/13/2016 and ultrasound on 10/17/2016. ANESTHESIA/SEDATION: 2.0 mg IV Versed; 100 mcg IV Fentanyl. Total Moderate Sedation Time 13 minutes. The patient's level of consciousness and physiologic status were continuously monitored during the procedure by  Radiology nursing. CONTRAST:  10 mL Isovue-300 MEDICATIONS: No additional medications. FLUOROSCOPY TIME:  1 minutes and 12 seconds.  13 mGy. PROCEDURE: The procedure, risks, benefits, and alternatives were explained to the patient. Questions regarding the procedure were encouraged and answered. The patient understands and consents to the procedure. A time-out was performed prior to initiating the procedure. The right abdominal wall was prepped with chlorhexidine in a sterile fashion, and a sterile drape was applied covering the operative field. A sterile gown and sterile gloves were used for the procedure. Local anesthesia was provided with 1% Lidocaine. Ultrasound image documentation was performed. Fluoroscopy during the procedure and fluoro spot radiograph confirms appropriate catheter position. Ultrasound was utilized to localize the gallbladder. Under direct ultrasound guidance, a 21 gauge needle was advanced via a transhepatic approach into the gallbladder lumen. Aspiration was performed and a bile sample sent for culture studies. A small amount of diluted contrast material was injected. A guide wire was then advanced into the gallbladder. A transitional dilator was placed. Percutaneous tract dilatation was then performed over a guide wire to 10-French. A 10-French pigtail drainage catheter was then advanced into the gallbladder lumen under fluoroscopy. Catheter was formed and injected with contrast material to confirm position. The catheter was flushed and connected to a gravity drainage bag. It was secured at the skin with a Prolene retention suture and Stat-Lock device. COMPLICATIONS: None FINDINGS: After needle puncture of the gallbladder, a bile sample was aspirated and sent for culture. The cholecystostomy tube was advanced into the gallbladder lumen and formed. It is now draining bile. This tube will be left to gravity drainage. IMPRESSION: Percutaneous cholecystostomy with placement of 10-French  drainage catheter into the gallbladder lumen. This was left to gravity drainage. Electronically Signed   By: [name] M.D.   On: 10/19/2016 17:11   Dg Chest [facility]  Result Date: 10/21/2016 CLINICAL DATA:  Fever.  Recent gallbladder surgery . EXAM: PORTABLE CHEST 1 VIEW COMPARISON:  10/16/2016 . FINDINGS: Heart size stable. Low lung volumes with basilar atelectasis again noted. No pleural effusion or pneumothorax. IMPRESSION: Low lung volumes with basilar atelectasis again noted. Electronically Signed   By: [place]   On: 10/21/2016 07:34     Scheduled Meds: . aspirin EC  81 mg Oral Daily  . hydrALAZINE  25 mg Oral QPM  . insulin aspart  0-5 Units Subcutaneous QHS  . insulin aspart  0-9 Units Subcutaneous TID WC  . verapamil  240 mg Oral QHS   Continuous Infusions: . heparin 1,600 Units/hr (10/21/16 1055)     LOS: 8 days   Chipper Oman, MD [facility] Pager [phone]  If 7PM-7AM, please contact night-coverage [email or web address] Password [email or web address] 10/21/2016, 3:19 PM

## 2016-10-22 ENCOUNTER — Inpatient Hospital Stay (HOSPITAL_COMMUNITY): Payer: Commercial Managed Care - HMO

## 2016-10-22 DIAGNOSIS — Z95828 Presence of other vascular implants and grafts: Secondary | ICD-10-CM

## 2016-10-22 DIAGNOSIS — S36122A Contusion of gallbladder, initial encounter: Secondary | ICD-10-CM

## 2016-10-22 DIAGNOSIS — N179 Acute kidney failure, unspecified: Secondary | ICD-10-CM

## 2016-10-22 DIAGNOSIS — I82402 Acute embolism and thrombosis of unspecified deep veins of left lower extremity: Secondary | ICD-10-CM

## 2016-10-22 DIAGNOSIS — Z978 Presence of other specified devices: Secondary | ICD-10-CM

## 2016-10-22 DIAGNOSIS — D72829 Elevated white blood cell count, unspecified: Secondary | ICD-10-CM

## 2016-10-22 DIAGNOSIS — R509 Fever, unspecified: Secondary | ICD-10-CM

## 2016-10-22 DIAGNOSIS — I5030 Unspecified diastolic (congestive) heart failure: Secondary | ICD-10-CM

## 2016-10-22 DIAGNOSIS — X58XXXA Exposure to other specified factors, initial encounter: Secondary | ICD-10-CM

## 2016-10-22 DIAGNOSIS — K810 Acute cholecystitis: Secondary | ICD-10-CM

## 2016-10-22 LAB — BASIC METABOLIC PANEL
Anion gap: 12 (ref 5–15)
BUN: 41 mg/dL — AB (ref 6–20)
CALCIUM: 7.9 mg/dL — AB (ref 8.9–10.3)
CO2: 27 mmol/L (ref 22–32)
Chloride: 96 mmol/L — ABNORMAL LOW (ref 101–111)
Creatinine, Ser: 2.35 mg/dL — ABNORMAL HIGH (ref 0.61–1.24)
GFR calc Af Amer: 29 mL/min — ABNORMAL LOW (ref >60)
GFR, EST NON AFRICAN AMERICAN: 25 mL/min — AB (ref >60)
GLUCOSE: 230 mg/dL — AB (ref 65–99)
POTASSIUM: 3.8 mmol/L (ref 3.5–5.1)
Sodium: 135 mmol/L (ref 135–145)

## 2016-10-22 LAB — CBC
HEMATOCRIT: 24.7 % — AB (ref 39.0–52.0)
HEMOGLOBIN: 7.9 g/dL — AB (ref 13.0–17.0)
MCH: 28.4 pg (ref 26.0–34.0)
MCHC: 32 g/dL (ref 30.0–36.0)
MCV: 88.8 fL (ref 78.0–100.0)
Platelets: 462 10*3/uL — ABNORMAL HIGH (ref 150–400)
RBC: 2.78 MIL/uL — ABNORMAL LOW (ref 4.22–5.81)
RDW: 14.7 % (ref 11.5–15.5)
WBC: 19.9 10*3/uL — ABNORMAL HIGH (ref 4.0–10.5)

## 2016-10-22 LAB — GLUCOSE, CAPILLARY
GLUCOSE-CAPILLARY: 337 mg/dL — AB (ref 65–99)
Glucose-Capillary: 284 mg/dL — ABNORMAL HIGH (ref 65–99)
Glucose-Capillary: 329 mg/dL — ABNORMAL HIGH (ref 65–99)
Glucose-Capillary: 340 mg/dL — ABNORMAL HIGH (ref 65–99)

## 2016-10-22 LAB — CULTURE, BLOOD (ROUTINE X 2): CULTURE: NO GROWTH

## 2016-10-22 LAB — APTT: aPTT: 71 seconds — ABNORMAL HIGH (ref 24–36)

## 2016-10-22 LAB — HEPARIN LEVEL (UNFRACTIONATED): HEPARIN UNFRACTIONATED: 1.12 [IU]/mL — AB (ref 0.30–0.70)

## 2016-10-22 MED ORDER — IOPAMIDOL (ISOVUE-300) INJECTION 61%
15.0000 mL | INTRAVENOUS | Status: AC
  Administered 2016-10-22 (×2): 15 mL via ORAL

## 2016-10-22 MED ORDER — INSULIN DETEMIR 100 UNIT/ML ~~LOC~~ SOLN
8.0000 [IU] | Freq: Two times a day (BID) | SUBCUTANEOUS | Status: DC
  Administered 2016-10-22 – 2016-10-24 (×5): 8 [IU] via SUBCUTANEOUS
  Filled 2016-10-22 (×6): qty 0.08

## 2016-10-22 MED ORDER — POLYETHYLENE GLYCOL 3350 17 G PO PACK
17.0000 g | PACK | Freq: Every day | ORAL | Status: DC
  Administered 2016-10-22 – 2016-10-26 (×5): 17 g via ORAL
  Filled 2016-10-22 (×5): qty 1

## 2016-10-22 MED ORDER — DOCUSATE SODIUM 100 MG PO CAPS
100.0000 mg | ORAL_CAPSULE | Freq: Two times a day (BID) | ORAL | Status: DC
  Administered 2016-10-22 – 2016-10-26 (×9): 100 mg via ORAL
  Filled 2016-10-22 (×9): qty 1

## 2016-10-22 MED ORDER — SODIUM CHLORIDE 0.45 % IV SOLN
INTRAVENOUS | Status: DC
Start: 2016-10-22 — End: 2016-10-25
  Administered 2016-10-22: 1000 mL via INTRAVENOUS

## 2016-10-22 NOTE — Progress Notes (Signed)
ANTICOAGULATION CONSULT NOTE - Follow Up Consult  Pharmacy Consult for Heparin Indication: DVT  No Known Allergies  Patient Measurements: Height: 5\' 6"  (167.6 cm) Weight: 199 lb 4.7 oz (90.4 kg) IBW/kg (Calculated) : 63.8 Heparin Dosing Weight: 79 kg  Vital Signs: Temp: 98.3 F (36.8 C) (12/01 0615) Temp Source: Oral (12/01 0615) BP: 135/63 (12/01 0615) Pulse Rate: 113 (12/01 0615)  Labs:  Recent Labs  10/20/16 0548 10/20/16 1443  10/21/16 0603 10/21/16 1828 10/22/16 0426  HGB 8.8*  --   --  8.3*  --  7.9*  HCT 27.3*  --   --  26.1*  --  24.7*  PLT 393  --   --  458*  --  462*  APTT 61* 81*  < > 60* 69* 71*  HEPARINUNFRC 1.94* 1.84*  --  1.34*  --  1.12*  CREATININE 2.03*  --   --  2.37*  --  2.35*  < > = values in this interval not displayed.  Estimated Creatinine Clearance: 27.7 mL/min (by C-G formula based on SCr of 2.35 mg/dL (H)).  Assessment: 75 yo M with new LLE DVT (diagnosed 11/23).  The patient was started on Heparin (11/21-11/24) then transitioned to apixaban (11/24-11/26) and now back to heparin (11/26 - now) due to possible need for surgery. The patient was determined to be too unstable for surgery at this time so a perc cholecystostomy was completed 11/28 in IR.  Heparin restarted post IR.   Currently on heparin at 1700 units/hr. Heparin level not correlating with aPTT at this time d/t effects of apixaban- aPTT 71 seconds, heparin level elevated at 1.12. Hgb has been gradually decreasing over the past 4 days, platelets remain elevated- no overt bleeding noted, has had bloody drain output.  Goal of Therapy:  Heparin level 0.3-0.7 units/ml aPTT 66-102 seconds Monitor platelets by anticoagulation protocol: Yes   Plan:  Continue heparin infusion at 1700 units/hr Daily heparin level and aPTT until correlating Daily CBC Follow for transition back to PO anticoagulation when renal function improves  Livier Hendel D. Austen Oyster, PharmD, BCPS Clinical  Pharmacist Pager: [phone] 10/22/2016 8:49 AM

## 2016-10-22 NOTE — Consult Note (Signed)
[facility]  Date of Admission:  10/12/2016  Date of Consult:  10/22/2016  Reason for Consult: Cholecystitis, acute Referring Physician: Elon Jester  Impression/Recommendation Leukocytosis Fever Cholecystitis  DVT  Diastolic HF (grade 1)  ARF (1.21  --> 2.35)  Would: Repeat BCx Continue Unsayn Continue f/u with surgery, IR Consider removing his RUE line if temps persist.  Could consider changing anbx to cipro/flagy if fever persists, to r/o drug fever.   Comment- Suspect fever from his gallbladder wall hematoma? Extension of his DVT and PE seem less likely.  A line infection is certainly a consideration. He denies diarrhea.     Thank you so much for this interesting consult,   Bobby Rumpf (pager) [phone] [email or web address]  Sandria Bales Sr. is an 75 y.o. male.  HPI: 75 yo M with hx of DM adm on 11-16 to 11-20 with ARF. He was d/c home off lasix. He returned 11-22 with L DVT, CHF. He was found to have elevated LFTs. His CT abd showed gallstones and thickened GB. His HIDA was positive. He underwent non-conclusive MRCP.  By 11-25 he began to have fever to 102.9. He was started on zosyn. He had BCx show E coli (pan-sens).   He underwent percutaneous cholechystostomy on 11-28.  He has had intermittent fevers, up to 102 today. His WBC is 19.9.  Her underwent CT abd today: Cholecystostomy tube within the gallbladder fundus. Gallbladder is markedly enlarged with large luminal hematoma.  Past Medical History:  Diagnosis Date  . CKD (chronic kidney disease), stage III   . Diabetes mellitus   . DVT (deep venous thrombosis) ([place]) 10/14/2016  . Extremity edema 09/2016  . Hyperlipemia   . Hypertension     Past Surgical History:  Procedure Laterality Date  . COLONOSCOPY    . IR GENERIC HISTORICAL  10/19/2016   IR PERC CHOLECYSTOSTOMY 10/19/2016 [name], MD [facility]     No Known Allergies  Medications:   Scheduled: . ampicillin-sulbactam (UNASYN) IV  3 g Intravenous Q12H  . aspirin EC  81 mg Oral Daily  . docusate sodium  100 mg Oral BID  . hydrALAZINE  25 mg Oral QPM  . insulin aspart  0-5 Units Subcutaneous QHS  . insulin aspart  0-9 Units Subcutaneous TID WC  . insulin detemir  8 Units Subcutaneous BID  . polyethylene glycol  17 g Oral Daily  . sodium chloride flush  10-40 mL Intracatheter Q12H  . verapamil  240 mg Oral QHS    Abtx:  Anti-infectives    Start     Dose/Rate Route Frequency Ordered Stop   10/21/16 2100  Ampicillin-Sulbactam (UNASYN) 3 g in sodium chloride 0.9 % 100 mL IVPB     3 g 200 mL/hr over 30 Minutes Intravenous Every 12 hours 10/21/16 1548     10/19/16 0915  ertapenem (INVANZ) 1 g in sodium chloride 0.9 % 50 mL IVPB  Status:  Discontinued     1 g 100 mL/hr over 30 Minutes Intravenous Every 24 hours 10/19/16 0909 10/19/16 0910   10/17/16 0400  piperacillin-tazobactam (ZOSYN) IVPB 3.375 g  Status:  Discontinued     3.375 g 12.5 mL/hr over 240 Minutes Intravenous Every 8 hours 10/16/16 2122 10/21/16 1519   10/16/16 2200  piperacillin-tazobactam (ZOSYN) IVPB 3.375 g     3.375 g 100 mL/hr over 30 Minutes Intravenous  Once 10/16/16 2122 10/16/16 2226      Total days of antibiotics: 6 (unsayn)  Social History:  reports that he has never smoked. He has never used smokeless tobacco. He reports that he does not drink alcohol or use drugs.  Family History  Problem Relation Age of Onset  . Heart attack Other   . Tuberculosis Other   . Tuberculosis Other   . Diabetes type II Father   . Hypertension Father   . Heart attack Maternal Aunt     General ROS: states he ha been eating well. having normal BM, no dysuria. denies abd pain. no sob. see HPI.   Blood pressure 129/64, pulse (!) 103, temperature (!) 102.3 F (39.1 C), temperature source Rectal, resp. rate 20, height _0  (1.676 m), weight 90.4 kg (199 lb 4.7 oz), SpO2 96 %. General appearance:  alert, cooperative and no distress Eyes: EOMI Throat: normal findings: oropharynx pink & moist without lesions or evidence of thrush Neck: no adenopathy and supple, symmetrical, trachea midline Lungs: clear to auscultation bilaterally Heart: regular rate and rhythm Abdomen: abnormal findings:  distended and hypoactive bowel sounds and RUQ drain in place Extremities: edema BLE L > R and IV line inR forearm.    Results for orders placed or performed during the hospital encounter of 10/12/16 (from the past 48 hour(s))  Culture, blood (Routine X 2) w Reflex to ID Panel     Status: None (Preliminary result)   Collection Time: 10/20/16  6:50 PM  Result Value Ref Range   Specimen Description BLOOD LEFT HAND    Special Requests BLOOD AEROBIC BOTTLE 10 CC, BANA 5 CC    Culture NO GROWTH 2 DAYS    Report Status PENDING   Culture, blood (Routine X 2) w Reflex to ID Panel     Status: None (Preliminary result)   Collection Time: 10/20/16  6:58 PM  Result Value Ref Range   Specimen Description BLOOD RIGHT HAND    Special Requests BLOOD AEROBIC BOTTLE 5 CC    Culture NO GROWTH 2 DAYS    Report Status PENDING   Glucose, capillary     Status: Abnormal   Collection Time: 10/20/16  9:01 PM  Result Value Ref Range   Glucose-Capillary 268 (H) 65 - 99 mg/dL  APTT     Status: Abnormal   Collection Time: 10/21/16 12:34 AM  Result Value Ref Range   aPTT 86 (H) 24 - 36 seconds    Comment:        IF BASELINE aPTT IS ELEVATED, SUGGEST PATIENT RISK ASSESSMENT BE USED TO DETERMINE APPROPRIATE ANTICOAGULANT THERAPY.   Heparin level (unfractionated)     Status: Abnormal   Collection Time: 10/21/16  6:03 AM  Result Value Ref Range   Heparin Unfractionated 1.34 (H) 0.30 - 0.70 IU/mL    Comment: RESULTS CONFIRMED BY MANUAL DILUTION REPEATED TO VERIFY        IF HEPARIN RESULTS ARE BELOW EXPECTED VALUES, AND PATIENT DOSAGE HAS BEEN CONFIRMED, SUGGEST FOLLOW UP TESTING OF ANTITHROMBIN III LEVELS.   CBC      Status: Abnormal   Collection Time: 10/21/16  6:03 AM  Result Value Ref Range   WBC 17.4 (H) 4.0 - 10.5 K/uL   RBC 2.93 (L) 4.22 - 5.81 MIL/uL   Hemoglobin 8.3 (L) 13.0 - 17.0 g/dL   HCT 26.1 (L) 39.0 - 52.0 %   MCV 89.1 78.0 - 100.0 fL   MCH 28.3 26.0 - 34.0 pg   MCHC 31.8 30.0 - 36.0 g/dL   RDW 14.7 11.5 - 15.5 %   Platelets  458 (H) 150 - 400 K/uL  Comprehensive metabolic panel     Status: Abnormal   Collection Time: 10/21/16  6:03 AM  Result Value Ref Range   Sodium 135 135 - 145 mmol/L   Potassium 4.1 3.5 - 5.1 mmol/L   Chloride 97 (L) 101 - 111 mmol/L   CO2 27 22 - 32 mmol/L   Glucose, Bld 174 (H) 65 - 99 mg/dL   BUN 36 (H) 6 - 20 mg/dL   Creatinine, Ser 2.37 (H) 0.61 - 1.24 mg/dL   Calcium 7.9 (L) 8.9 - 10.3 mg/dL   Total Protein 6.6 6.5 - 8.1 g/dL   Albumin 1.6 (L) 3.5 - 5.0 g/dL   AST 62 (H) 15 - 41 U/L   ALT 72 (H) 17 - 63 U/L   Alkaline Phosphatase 257 (H) 38 - 126 U/L   Total Bilirubin 1.2 0.3 - 1.2 mg/dL   GFR calc non Af Amer 25 (L) >60 mL/min   GFR calc Af Amer 29 (L) >60 mL/min    Comment: (NOTE) The eGFR has been calculated using the CKD EPI equation. This calculation has not been validated in all clinical situations. eGFR's persistently <60 mL/min signify possible Chronic Kidney Disease.    Anion gap 11 5 - 15  APTT     Status: Abnormal   Collection Time: 10/21/16  6:03 AM  Result Value Ref Range   aPTT 60 (H) 24 - 36 seconds    Comment:        IF BASELINE aPTT IS ELEVATED, SUGGEST PATIENT RISK ASSESSMENT BE USED TO DETERMINE APPROPRIATE ANTICOAGULANT THERAPY.   Glucose, capillary     Status: Abnormal   Collection Time: 10/21/16  7:44 AM  Result Value Ref Range   Glucose-Capillary 189 (H) 65 - 99 mg/dL  Glucose, capillary     Status: Abnormal   Collection Time: 10/21/16 12:11 PM  Result Value Ref Range   Glucose-Capillary 224 (H) 65 - 99 mg/dL  Glucose, capillary     Status: Abnormal   Collection Time: 10/21/16  4:18 PM  Result Value Ref  Range   Glucose-Capillary 249 (H) 65 - 99 mg/dL  APTT     Status: Abnormal   Collection Time: 10/21/16  6:28 PM  Result Value Ref Range   aPTT 69 (H) 24 - 36 seconds    Comment:        IF BASELINE aPTT IS ELEVATED, SUGGEST PATIENT RISK ASSESSMENT BE USED TO DETERMINE APPROPRIATE ANTICOAGULANT THERAPY.   Glucose, capillary     Status: Abnormal   Collection Time: 10/21/16  9:43 PM  Result Value Ref Range   Glucose-Capillary 198 (H) 65 - 99 mg/dL  Heparin level (unfractionated)     Status: Abnormal   Collection Time: 10/22/16  4:26 AM  Result Value Ref Range   Heparin Unfractionated 1.12 (H) 0.30 - 0.70 IU/mL    Comment: RESULTS CONFIRMED BY MANUAL DILUTION REPEATED TO VERIFY        IF HEPARIN RESULTS ARE BELOW EXPECTED VALUES, AND PATIENT DOSAGE HAS BEEN CONFIRMED, SUGGEST FOLLOW UP TESTING OF ANTITHROMBIN III LEVELS.   Basic metabolic panel     Status: Abnormal   Collection Time: 10/22/16  4:26 AM  Result Value Ref Range   Sodium 135 135 - 145 mmol/L   Potassium 3.8 3.5 - 5.1 mmol/L   Chloride 96 (L) 101 - 111 mmol/L   CO2 27 22 - 32 mmol/L   Glucose, Bld 230 (H) 65 - 99 mg/dL  BUN 41 (H) 6 - 20 mg/dL   Creatinine, Ser 2.35 (H) 0.61 - 1.24 mg/dL   Calcium 7.9 (L) 8.9 - 10.3 mg/dL   GFR calc non Af Amer 25 (L) >60 mL/min   GFR calc Af Amer 29 (L) >60 mL/min    Comment: (NOTE) The eGFR has been calculated using the CKD EPI equation. This calculation has not been validated in all clinical situations. eGFR's persistently <60 mL/min signify possible Chronic Kidney Disease.    Anion gap 12 5 - 15  CBC     Status: Abnormal   Collection Time: 10/22/16  4:26 AM  Result Value Ref Range   WBC 19.9 (H) 4.0 - 10.5 K/uL   RBC 2.78 (L) 4.22 - 5.81 MIL/uL   Hemoglobin 7.9 (L) 13.0 - 17.0 g/dL   HCT 24.7 (L) 39.0 - 52.0 %   MCV 88.8 78.0 - 100.0 fL   MCH 28.4 26.0 - 34.0 pg   MCHC 32.0 30.0 - 36.0 g/dL   RDW 14.7 11.5 - 15.5 %   Platelets 462 (H) 150 - 400 K/uL  APTT      Status: Abnormal   Collection Time: 10/22/16  4:26 AM  Result Value Ref Range   aPTT 71 (H) 24 - 36 seconds    Comment:        IF BASELINE aPTT IS ELEVATED, SUGGEST PATIENT RISK ASSESSMENT BE USED TO DETERMINE APPROPRIATE ANTICOAGULANT THERAPY.   Glucose, capillary     Status: Abnormal   Collection Time: 10/22/16  7:13 AM  Result Value Ref Range   Glucose-Capillary 337 (H) 65 - 99 mg/dL  Glucose, capillary     Status: Abnormal   Collection Time: 10/22/16 11:22 AM  Result Value Ref Range   Glucose-Capillary 284 (H) 65 - 99 mg/dL   Comment 1 Notify RN   Glucose, capillary     Status: Abnormal   Collection Time: 10/22/16  4:16 PM  Result Value Ref Range   Glucose-Capillary 329 (H) 65 - 99 mg/dL      Component Value Date/Time   SDES BLOOD RIGHT HAND 10/20/2016 1858   SPECREQUEST BLOOD AEROBIC BOTTLE 5 CC 10/20/2016 1858   CULT NO GROWTH 2 DAYS 10/20/2016 1858   REPTSTATUS PENDING 10/20/2016 1858   Ct Abdomen Pelvis Wo Contrast  Result Date: 10/22/2016 CLINICAL DATA:  75 y/o M; right-sided abdominal pain after percutaneous cholecystostomy. EXAM: CT ABDOMEN AND PELVIS WITHOUT CONTRAST TECHNIQUE: Multidetector CT imaging of the abdomen and pelvis was performed following the standard protocol without IV contrast. COMPARISON:  10/17/2016 abdominal ultrasound. 10/16/2016 abdominal MRI. 10/13/2016 CT abdomen. FINDINGS: Lower chest: Small right greater than left pleural effusions with dependent opacities likely representing atelectasis. Hepatobiliary: No focal liver abnormality. There is a cholecystostomy tube with pigtail in the upper gallbladder fundus. The gallbladder is markedly enlarged small foci of air in a large high attenuation collection likely representing a hematoma. Pancreas: Unremarkable. No pancreatic ductal dilatation or surrounding inflammatory changes. Spleen: Normal in size without focal abnormality. Adrenals/Urinary Tract: Adrenal glands are unremarkable. Stable left kidney  interpolar and right kidney upper pole cysts. Kidneys are otherwise normal, without renal calculi, focal lesion, or hydronephrosis. Bladder is unremarkable. Stomach/Bowel: Stomach is within normal limits. Appendix appears normal. No evidence of bowel wall thickening, distention, or inflammatory changes. Vascular/Lymphatic: Aortic atherosclerosis. No enlarged abdominal or pelvic lymph nodes. Reproductive: Prostate is unremarkable. Other: Small volume of right hemi abdomen ascites. Musculoskeletal: Multilevel degenerative changes of the thoracic and lumbar spine and mild lumbar  levocurvature. Mild bilateral hip osteoarthrosis. No acute osseous abnormality is evident. IMPRESSION: 1. Cholecystostomy tube within the gallbladder fundus. Gallbladder is markedly enlarged with large luminal hematoma. 2. Small volume of right hemi abdomen ascites is likely reactive. 3. Small bilateral pleural effusions with dependent atelectasis. 4. Aortic atherosclerosis. These results will be called to the ordering clinician or representative by the Radiologist Assistant, and communication documented in the PACS or zVision Dashboard. Electronically Signed   By: Kristine Garbe M.D.   On: 10/22/2016 17:07   Dg Chest Port 1 View  Result Date: 10/21/2016 CLINICAL DATA:  Fever.  Recent gallbladder surgery . EXAM: PORTABLE CHEST 1 VIEW COMPARISON:  10/16/2016 . FINDINGS: Heart size stable. Low lung volumes with basilar atelectasis again noted. No pleural effusion or pneumothorax. IMPRESSION: Low lung volumes with basilar atelectasis again noted. Electronically Signed   By: Marcello Moores  Register   On: 10/21/2016 07:34   Recent Results (from the past 240 hour(s))  Culture, blood (routine x 2)     Status: None   Collection Time: 10/16/16 10:16 PM  Result Value Ref Range Status   Specimen Description BLOOD LEFT HAND  Final   Special Requests BOTTLES DRAWN AEROBIC AND ANAEROBIC 5ML  Final   Culture NO GROWTH 5 DAYS  Final   Report  Status 10/22/2016 FINAL  Final  Culture, blood (routine x 2)     Status: Abnormal   Collection Time: 10/16/16 10:25 PM  Result Value Ref Range Status   Specimen Description BLOOD RIGHT HAND  Final   Special Requests IN PEDIATRIC BOTTLE 3ML  Final   Culture  Setup Time   Final    GRAM NEGATIVE RODS IN PEDIATRIC BOTTLE CRITICAL RESULT CALLED TO, READ BACK BY AND VERIFIED WITH: C BALL,PHARMD AT 1405 10/17/16 BY L BENFIELD    Culture ESCHERICHIA COLI (A)  Final   Report Status 10/19/2016 FINAL  Final   Organism ID, Bacteria ESCHERICHIA COLI  Final      Susceptibility   Escherichia coli - MIC*    AMPICILLIN <=2 SENSITIVE Sensitive     CEFAZOLIN <=4 SENSITIVE Sensitive     CEFEPIME <=1 SENSITIVE Sensitive     CEFTAZIDIME <=1 SENSITIVE Sensitive     CEFTRIAXONE <=1 SENSITIVE Sensitive     CIPROFLOXACIN <=0.25 SENSITIVE Sensitive     GENTAMICIN <=1 SENSITIVE Sensitive     IMIPENEM <=0.25 SENSITIVE Sensitive     TRIMETH/SULFA <=20 SENSITIVE Sensitive     AMPICILLIN/SULBACTAM <=2 SENSITIVE Sensitive     PIP/TAZO <=4 SENSITIVE Sensitive     Extended ESBL NEGATIVE Sensitive     * ESCHERICHIA COLI  Blood Culture ID Panel (Reflexed)     Status: Abnormal   Collection Time: 10/16/16 10:25 PM  Result Value Ref Range Status   Enterococcus species NOT DETECTED NOT DETECTED Final   Listeria monocytogenes NOT DETECTED NOT DETECTED Final   Staphylococcus species NOT DETECTED NOT DETECTED Final   Staphylococcus aureus NOT DETECTED NOT DETECTED Final   Streptococcus species NOT DETECTED NOT DETECTED Final   Streptococcus agalactiae NOT DETECTED NOT DETECTED Final   Streptococcus pneumoniae NOT DETECTED NOT DETECTED Final   Streptococcus pyogenes NOT DETECTED NOT DETECTED Final   Acinetobacter baumannii NOT DETECTED NOT DETECTED Final   Enterobacteriaceae species DETECTED (A) NOT DETECTED Final    Comment: CRITICAL RESULT CALLED TO, READ BACK BY AND VERIFIED WITH: C BALL,PHARMD AT 1405 10/17/16 BY  L BENFIELD    Enterobacter cloacae complex NOT DETECTED NOT DETECTED Final   Escherichia  coli DETECTED (A) NOT DETECTED Final    Comment: CRITICAL RESULT CALLED TO, READ BACK BY AND VERIFIED WITH: C BALL,PHARMD AT 1405 10/17/16 BY L BENFIELD    Klebsiella oxytoca NOT DETECTED NOT DETECTED Final   Klebsiella pneumoniae NOT DETECTED NOT DETECTED Final   Proteus species NOT DETECTED NOT DETECTED Final   Serratia marcescens NOT DETECTED NOT DETECTED Final   Carbapenem resistance NOT DETECTED NOT DETECTED Final   Haemophilus influenzae NOT DETECTED NOT DETECTED Final   Neisseria meningitidis NOT DETECTED NOT DETECTED Final   Pseudomonas aeruginosa NOT DETECTED NOT DETECTED Final   Candida albicans NOT DETECTED NOT DETECTED Final   Candida glabrata NOT DETECTED NOT DETECTED Final   Candida krusei NOT DETECTED NOT DETECTED Final   Candida parapsilosis NOT DETECTED NOT DETECTED Final   Candida tropicalis NOT DETECTED NOT DETECTED Final  Aerobic/Anaerobic Culture (surgical/deep wound)     Status: None (Preliminary result)   Collection Time: 10/19/16  4:28 PM  Result Value Ref Range Status   Specimen Description GALL BLADDER  Final   Special Requests Normal  Final   Gram Stain   Final    ABUNDANT WBC PRESENT,BOTH PMN AND MONONUCLEAR NO ORGANISMS SEEN    Culture   Final    FEW ESCHERICHIA COLI NO ANAEROBES ISOLATED; CULTURE IN PROGRESS FOR 5 DAYS    Report Status PENDING  Incomplete   Organism ID, Bacteria ESCHERICHIA COLI  Final      Susceptibility   Escherichia coli - MIC*    AMPICILLIN <=2 SENSITIVE Sensitive     CEFAZOLIN <=4 SENSITIVE Sensitive     CEFEPIME <=1 SENSITIVE Sensitive     CEFTAZIDIME <=1 SENSITIVE Sensitive     CEFTRIAXONE <=1 SENSITIVE Sensitive     CIPROFLOXACIN <=0.25 SENSITIVE Sensitive     GENTAMICIN <=1 SENSITIVE Sensitive     IMIPENEM <=0.25 SENSITIVE Sensitive     TRIMETH/SULFA <=20 SENSITIVE Sensitive     AMPICILLIN/SULBACTAM <=2 SENSITIVE Sensitive      PIP/TAZO <=4 SENSITIVE Sensitive     Extended ESBL NEGATIVE Sensitive     * FEW ESCHERICHIA COLI  Culture, blood (Routine X 2) w Reflex to ID Panel     Status: None (Preliminary result)   Collection Time: 10/20/16  6:50 PM  Result Value Ref Range Status   Specimen Description BLOOD LEFT HAND  Final   Special Requests BLOOD AEROBIC BOTTLE 10 CC, BANA 5 CC  Final   Culture NO GROWTH 2 DAYS  Final   Report Status PENDING  Incomplete  Culture, blood (Routine X 2) w Reflex to ID Panel     Status: None (Preliminary result)   Collection Time: 10/20/16  6:58 PM  Result Value Ref Range Status   Specimen Description BLOOD RIGHT HAND  Final   Special Requests BLOOD AEROBIC BOTTLE 5 CC  Final   Culture NO GROWTH 2 DAYS  Final   Report Status PENDING  Incomplete      10/22/2016, 5:20 PM     LOS: 9 days    Records and images were personally reviewed where available.

## 2016-10-22 NOTE — Progress Notes (Signed)
Patient ID: Stephen Bales Sr., male   DOB: 12-06-1940, 75 y.o.   MRN: [id number]    Referring Physician(s): Dr. Rolm Bookbinder  Supervising Physician: Corrie Mckusick  Patient Status:  [facility] - In-pt  Chief Complaint:  Acute cholecystitis  Subjective: Patient lethargic today.  Arousable and answers questions but overall weak.  Reports his appetite has decreased and he has not been eating much; only drinking 2 Ensures yesterday. Note elevated WBC (17.4  19.9) and decreased HgB (7.9).  Drain in place with blood tinged output.   Allergies: Patient has no known allergies.  Medications: Prior to Admission medications   Medication Sig Start Date End Date Taking? Authorizing Provider  aspirin EC 81 MG tablet Take 81 mg by mouth every morning.    Yes Historical Provider, MD  cloNIDine (CATAPRES) 0.2 MG tablet Take 0.2 mg by mouth daily.    Yes Historical Provider, MD  furosemide (LASIX) 20 MG tablet Take 10 mg by mouth daily as needed for edema or fluid. 08/14/16  Yes Historical Provider, MD  glipiZIDE (GLUCOTROL) 10 MG tablet Take 10 mg by mouth daily before breakfast.    Yes Historical Provider, MD  hydrALAZINE (APRESOLINE) 25 MG tablet Take 25 mg by mouth every evening.    Yes Historical Provider, MD  lisinopril (PRINIVIL,ZESTRIL) 40 MG tablet Take 20 mg by mouth every evening.    Yes Historical Provider, MD  metFORMIN (GLUCOPHAGE) 1000 MG tablet Take 1,000 mg by mouth every evening.    Yes Historical Provider, MD  simvastatin (ZOCOR) 40 MG tablet Take 40 mg by mouth every evening.    Yes Historical Provider, MD  verapamil (CALAN-SR) 240 MG CR tablet Take 240 mg by mouth at bedtime.   Yes Historical Provider, MD     Vital Signs: BP 135/63 (BP Location: Left Arm)   Pulse (!) 113   Temp 98.3 F (36.8 C) (Oral)   Resp 20   Ht 5\' 6"  (1.676 m)   Wt 199 lb 4.7 oz (90.4 kg)   SpO2 94%   BMI 32.17 kg/m   Physical Exam  Imaging: Nm Hepatobiliary Liver Func  Result Date:  10/18/2016 CLINICAL DATA:  Gallstones.  Fever. EXAM: NUCLEAR MEDICINE HEPATOBILIARY IMAGING TECHNIQUE: Sequential images of the abdomen were obtained out to 60 minutes following intravenous administration of radiopharmaceutical. RADIOPHARMACEUTICALS:  5.2 mCi Tc-38m  Choletec IV COMPARISON:  Ultrasound 10/17/2016.  MRI 10/16/2016 FINDINGS: Prompt uptake and biliary excretion of activity by the liver is seen. Large photopenic defect related to distended gallbladder. No filling of the gallbladder. Small bowel activity is identified beginning at 15 minutes. The patient received 3.0 mg IV morphine at 1 hour. Additional scanning for 30 minutes demonstrates no gallbladder activity. IMPRESSION: Markedly distended gallbladder. No gallbladder uptake is seen the 4 after IV morphine administration. Findings consistent with cystic duct obstruction. Electronically Signed   By: Franchot Gallo M.D.   On: 10/18/2016 10:14   Ir Perc Cholecystostomy  Result Date: 10/19/2016 CLINICAL DATA:  Cholecystitis and need for percutaneous cholecystostomy due to poor candidacy for immediate cholecystectomy currently. EXAM: PERCUTANEOUS CHOLECYSTOSTOMY COMPARISON:  CT of the abdomen on 10/13/2016 and ultrasound on 10/17/2016. ANESTHESIA/SEDATION: 2.0 mg IV Versed; 100 mcg IV Fentanyl. Total Moderate Sedation Time 13 minutes. The patient's level of consciousness and physiologic status were continuously monitored during the procedure by Radiology nursing. CONTRAST:  10 mL Isovue-300 MEDICATIONS: No additional medications. FLUOROSCOPY TIME:  1 minutes and 12 seconds.  13 mGy. PROCEDURE: The procedure, risks, benefits, and  alternatives were explained to the patient. Questions regarding the procedure were encouraged and answered. The patient understands and consents to the procedure. A time-out was performed prior to initiating the procedure. The right abdominal wall was prepped with chlorhexidine in a sterile fashion, and a sterile drape was  applied covering the operative field. A sterile gown and sterile gloves were used for the procedure. Local anesthesia was provided with 1% Lidocaine. Ultrasound image documentation was performed. Fluoroscopy during the procedure and fluoro spot radiograph confirms appropriate catheter position. Ultrasound was utilized to localize the gallbladder. Under direct ultrasound guidance, a 21 gauge needle was advanced via a transhepatic approach into the gallbladder lumen. Aspiration was performed and a bile sample sent for culture studies. A small amount of diluted contrast material was injected. A guide wire was then advanced into the gallbladder. A transitional dilator was placed. Percutaneous tract dilatation was then performed over a guide wire to 10-French. A 10-French pigtail drainage catheter was then advanced into the gallbladder lumen under fluoroscopy. Catheter was formed and injected with contrast material to confirm position. The catheter was flushed and connected to a gravity drainage bag. It was secured at the skin with a Prolene retention suture and Stat-Lock device. COMPLICATIONS: None FINDINGS: After needle puncture of the gallbladder, a bile sample was aspirated and sent for culture. The cholecystostomy tube was advanced into the gallbladder lumen and formed. It is now draining bile. This tube will be left to gravity drainage. IMPRESSION: Percutaneous cholecystostomy with placement of 10-French drainage catheter into the gallbladder lumen. This was left to gravity drainage. Electronically Signed   By: [name] M.D.   On: 10/19/2016 17:11   Dg Chest Port 1 View  Result Date: 10/21/2016 CLINICAL DATA:  Fever.  Recent gallbladder surgery . EXAM: PORTABLE CHEST 1 VIEW COMPARISON:  10/16/2016 . FINDINGS: Heart size stable. Low lung volumes with basilar atelectasis again noted. No pleural effusion or pneumothorax. IMPRESSION: Low lung volumes with basilar atelectasis again noted. Electronically  Signed   By: Marcello Moores  Register   On: 10/21/2016 07:34    Labs:  CBC:  Recent Labs  10/19/16 0405 10/20/16 0548 10/21/16 0603 10/22/16 0426  WBC 17.2* 18.0* 17.4* 19.9*  HGB 9.2* 8.8* 8.3* 7.9*  HCT 28.8* 27.3* 26.1* 24.7*  PLT 354 393 458* 462*    COAGS:  Recent Labs  10/13/16 1414  10/21/16 0034 10/21/16 0603 10/21/16 1828 10/22/16 0426  INR 1.16  --   --   --   --   --   APTT 27  < > 86* 60* 69* 71*  < > = values in this interval not displayed.  BMP:  Recent Labs  10/18/16 0544 10/20/16 0548 10/21/16 0603 10/22/16 0426  NA 135 133* 135 135  K 3.8 3.7 4.1 3.8  CL 97* 94* 97* 96*  CO2 26 26 27 27   GLUCOSE 150* 216* 174* 230*  BUN 24* 33* 36* 41*  CALCIUM 8.0* 7.9* 7.9* 7.9*  CREATININE 1.81* 2.03* 2.37* 2.35*  GFRNONAA 34* 30* 25* 25*  GFRAA 40* 35* 29* 29*    LIVER FUNCTION TESTS:  Recent Labs  10/17/16 0917 10/18/16 0544 10/20/16 0548 10/21/16 0603  BILITOT 2.7* 2.1* 1.3* 1.2  AST 260* 117* 69* 62*  ALT 224* 161* 83* 72*  ALKPHOS 581* 466* 305* 257*  PROT 6.3* 6.3* 6.2* 6.6  ALBUMIN 1.8* 1.9* 1.6* 1.6*    Assessment and Plan:  Acute Cholecystitis Patient is s/p percutaneous cholecystostomy tube placement by Dr. Kathlene Cote (  11/28). Continue with routine drain care and 10 mL BID flushes. Continue to trend lab results. Consider repeating CT scan.  Cr elevated to 2.35 today. Patient will need follow-up with IR for drain management at discharge.   Electronically Signed: Docia Barrier 10/22/2016, 8:51 AM   I spent a total of 15 Minutes at the the patient's bedside AND on the patient's hospital floor or unit, greater than 50% of which was counseling/coordinating care for acute cholecystitis

## 2016-10-22 NOTE — Care Management Note (Addendum)
Case Management Note  Patient Details  Name: Stephen RHATIGAN Sr. MRN: [id number] Date of Birth: 1940/12/26  Subjective/Objective:    CM following for progression and d/c planning. Ongoing plan for d/c to SNF,                 Action/Plan: 10/22/2016 Pt for d/c however not with fever spikes continuing and Biliary drain now serosangous in color. IV antibiotic changed to IV Unisyn from [place]. Will continue to follow.   Expected Discharge Date:    10/25/2016              Expected Discharge Plan:  [place]  In-House Referral:  Clinical Social Work  Discharge planning Services  CM Consult  Post Acute Care Choice:  NA Choice offered to:  NA  DME Arranged:   NA DME Agency:   NA  HH Arranged:   NA HH Agency:   NA  Status of Service:  Completed, signed off  If discussed at [employer] of Stay Meetings, dates discussed:    Additional Comments:  Adron Bene, RN 10/22/2016, 11:24 AM

## 2016-10-22 NOTE — Progress Notes (Signed)
PROGRESS NOTE [address]   Stephen STUBBINS Sr.   [id number] DOB: Nov 25, 1940  DOA: 10/12/2016 PCP: [name], MD   Brief Narrative:  75 year old African-American male with a past medical history of hypertension, diabetes, presented to the hospital with complaints of leg swelling and inability to walk. Patient was recently hospitalized to [facility] from November 16 November 20 for acute renal failure. He was given IV fluids. He went home and noted that his legs were swollen. His Lasix was not resumed. He found it difficult to ambulate so he presented to the hospital here. He was hospitalized for further management. Patient diuresed well. He was also found to have acute DVT for which he was placed on anticoagulation. His LFTs were improving, but then he started spiking fever. He was seen by gastroenterology. Subsequently, general surgery was consulted as well for suspected cholecystitis.  Subjective: Patient seen and examined. Patient more lethargic today, although respond to verbal stimuli and engage in conversation. Patient spike  fever today Tmax 102.7. Patient's only c/o is SOB. CXR from yesterday shows atelectasis. Denies cough, nasal congestion. Has not had a BM in several days.   Assessment & Plan: Fever with increase in WBC - unknown source at this time - repeated blood cultures from 11/29 so far negative, CXR no PNA, full physical exam does not reveal any lesions. Drainage from chole is bloody. Urine culture pending. While atelectasis can be the cause of the fever, I suspect that this could be form his gallbladder  Will repeat CT abdomen without contrast, Get ID consult . I will continue current abx as E coli was sensitive, until evaluated by ID  Start IVF given rise in creatinine - monitor for signs of fluid overload. ECHO with G1DD   Escherichia coli bacteremia and sepsis - pansensitive, sepsis has resolved Patient spiked temperature of 103 while he is in the  hospital, blood cultures showed Escherichia coli.d. Treated with IV fluids and antibiotics,  Repeated blood cultures so far no growth Pansensitive Escherichia coli, can be discharged on oral amoxicillin or Cipro for total of 2 weeks from the day of second blood cultures, 10/16/2016 - 10/30/2016 See above   Acute cholecystitis, status post percutaneous cholecystectomy with tubes in place.  Initially with Fever, transaminitis and jaundice,  Abdominal ultrasound showed multiple gallstones. HIDA scan showing cystic duct obstruction consistent with acute cholecystitis. IR recommendations appreciated, can be d/c with drains  Gallbladder cx show E coli pansensitive  Continue Unasyn for now   Transaminitis - trending down almost back to normal  Likely secondary to acute cholecystitis  Acute Distal left lower extremity DVT -Likely due to recent hospitalization and sedentary status. Initially placed on IV heparin.  -Hold Eliquis tonight given increase in serum Cr .  Elevated troponin/abnormal EKG Patient with nonspecific changes on EKG. No acute ischemic changes noted. Patient denies any chest pains. Echocardiogram does not show any wall motion abnormalities. No further work up at this time  Essential hypertension- stable  Holding ACEi today  Continue hydralazine and verapamil  Monitor BP   Diabetes mellitus type 2 CBG's increasing  Continue SSI  Monitor CBG's Will start Levemir 8 units BID   Chronic kidney disease stage 3 - Acute kidney injury - 2/2 dehydration in combination with ACE and Lasix  Will start on IVF  Hold ACEi and Lasix tonight  Monitor BMP in AM   Hyperlipidemia Will resume statin upon discharge    Normocytic anemia. Anemia panel reviewed. No clear deficiencies identified.  No evidence for overt bleeding.  DVT prophylaxis: Eliquis  Code Status: Full  Family Communication: None at bedside  Disposition Plan: Anticipate discharge to SNF when medically stable    Consultants:   Surgery   IR   Procedures:   Percutaneous Cholecystectomy   Antimicrobials:   Zosyn 11/25   Objective: Vitals:   10/22/16 0623 10/22/16 0838 10/22/16 0901 10/22/16 0905  BP:    129/64  Pulse:    (!) 103  Resp:    20  Temp:  (!) 102.7 F (39.3 C) (!) 102.3 F (39.1 C)   TempSrc:  Oral Rectal   SpO2: 94%   96%  Weight:      Height:        Intake/Output Summary (Last 24 hours) at 10/22/16 1557 Last data filed at 10/22/16 1300  Gross per 24 hour  Intake          1594.12 ml  Output             1660 ml  Net           -65.88 ml   Filed Weights   10/19/16 2055 10/20/16 2104 10/21/16 2151  Weight: 89.3 kg (196 lb 13.9 oz) 90.3 kg (199 lb 1.2 oz) 90.4 kg (199 lb 4.7 oz)    Examination:  General exam: Lethargic but responsive  Respiratory system: decrease breath sound at the bases, no crackles or rales   Cardiovascular system: S1 & S2 heard, RRR.  Gastrointestinal system: Abd soft NTND,  Extremities: 2+ pedal edema Skin: No lesion or ulcers   Data Reviewed: I have personally reviewed following labs and imaging studies  CBC:  Recent Labs Lab 10/17/16 0917 10/18/16 0544 10/19/16 0405 10/20/16 0548 10/21/16 0603 10/22/16 0426  WBC 21.0* 21.8* 17.2* 18.0* 17.4* 19.9*  NEUTROABS 18.5* 18.8*  --   --   --   --   HGB 10.3* 10.2* 9.2* 8.8* 8.3* 7.9*  HCT 31.3* 30.9* 28.8* 27.3* 26.1* 24.7*  MCV 87.9 88.3 88.9 88.9 89.1 88.8  PLT 390 370 354 393 458* [id number]*   Basic Metabolic Panel:  Recent Labs Lab 10/17/16 0917 10/18/16 0544 10/20/16 0548 10/21/16 0603 10/22/16 0426  NA 128* 135 133* 135 135  K 3.6 3.8 3.7 4.1 3.8  CL 90* 97* 94* 97* 96*  CO2 27 26 26 27 27   GLUCOSE 179* 150* 216* 174* 230*  BUN 18 24* 33* 36* 41*  CREATININE 1.43* 1.81* 2.03* 2.37* 2.35*  CALCIUM 7.7* 8.0* 7.9* 7.9* 7.9*   GFR: Estimated Creatinine Clearance: 27.7 mL/min (by C-G formula based on SCr of 2.35 mg/dL (H)). Liver Function Tests:  Recent Labs Lab  10/16/16 0507 10/17/16 0917 10/18/16 0544 10/20/16 0548 10/21/16 0603  AST 187* 260* 117* 69* 62*  ALT 241* 224* 161* 83* 72*  ALKPHOS 521* 581* 466* 305* 257*  BILITOT 0.8 2.7* 2.1* 1.3* 1.2  PROT 7.2 6.3* 6.3* 6.2* 6.6  ALBUMIN 2.2* 1.8* 1.9* 1.6* 1.6*   No results for input(s): LIPASE, AMYLASE in the last 168 hours. No results for input(s): AMMONIA in the last 168 hours. Coagulation Profile: No results for input(s): INR, PROTIME in the last 168 hours. Cardiac Enzymes: No results for input(s): CKTOTAL, CKMB, CKMBINDEX, TROPONINI in the last 168 hours. BNP (last 3 results) No results for input(s): PROBNP in the last 8760 hours. HbA1C: No results for input(s): HGBA1C in the last 72 hours. CBG:  Recent Labs Lab 10/21/16 1211 10/21/16 1618 10/21/16 2143 10/22/16 0713 10/22/16 1122  GLUCAP 224* 249* 198* 337* 284*   Lipid Profile: No results for input(s): CHOL, HDL, LDLCALC, TRIG, CHOLHDL, LDLDIRECT in the last 72 hours. Thyroid Function Tests: No results for input(s): TSH, T4TOTAL, FREET4, T3FREE, THYROIDAB in the last 72 hours. Anemia Panel: No results for input(s): VITAMINB12, FOLATE, FERRITIN, TIBC, IRON, RETICCTPCT in the last 72 hours. Sepsis Labs:  Recent Labs Lab 10/16/16 2200  LATICACIDVEN 0.9    Recent Results (from the past 240 hour(s))  Culture, blood (routine x 2)     Status: None   Collection Time: 10/16/16 10:16 PM  Result Value Ref Range Status   Specimen Description BLOOD LEFT HAND  Final   Special Requests BOTTLES DRAWN AEROBIC AND ANAEROBIC 5ML  Final   Culture NO GROWTH 5 DAYS  Final   Report Status 10/22/2016 FINAL  Final  Culture, blood (routine x 2)     Status: Abnormal   Collection Time: 10/16/16 10:25 PM  Result Value Ref Range Status   Specimen Description BLOOD RIGHT HAND  Final   Special Requests IN PEDIATRIC BOTTLE 3ML  Final   Culture  Setup Time   Final    GRAM NEGATIVE RODS IN PEDIATRIC BOTTLE CRITICAL RESULT CALLED TO, READ  BACK BY AND VERIFIED WITH: C BALL,PHARMD AT 1405 10/17/16 BY L BENFIELD    Culture ESCHERICHIA COLI (A)  Final   Report Status 10/19/2016 FINAL  Final   Organism ID, Bacteria ESCHERICHIA COLI  Final      Susceptibility   Escherichia coli - MIC*    AMPICILLIN <=2 SENSITIVE Sensitive     CEFAZOLIN <=4 SENSITIVE Sensitive     CEFEPIME <=1 SENSITIVE Sensitive     CEFTAZIDIME <=1 SENSITIVE Sensitive     CEFTRIAXONE <=1 SENSITIVE Sensitive     CIPROFLOXACIN <=0.25 SENSITIVE Sensitive     GENTAMICIN <=1 SENSITIVE Sensitive     IMIPENEM <=0.25 SENSITIVE Sensitive     TRIMETH/SULFA <=20 SENSITIVE Sensitive     AMPICILLIN/SULBACTAM <=2 SENSITIVE Sensitive     PIP/TAZO <=4 SENSITIVE Sensitive     Extended ESBL NEGATIVE Sensitive     * ESCHERICHIA COLI  Blood Culture ID Panel (Reflexed)     Status: Abnormal   Collection Time: 10/16/16 10:25 PM  Result Value Ref Range Status   Enterococcus species NOT DETECTED NOT DETECTED Final   Listeria monocytogenes NOT DETECTED NOT DETECTED Final   Staphylococcus species NOT DETECTED NOT DETECTED Final   Staphylococcus aureus NOT DETECTED NOT DETECTED Final   Streptococcus species NOT DETECTED NOT DETECTED Final   Streptococcus agalactiae NOT DETECTED NOT DETECTED Final   Streptococcus pneumoniae NOT DETECTED NOT DETECTED Final   Streptococcus pyogenes NOT DETECTED NOT DETECTED Final   Acinetobacter baumannii NOT DETECTED NOT DETECTED Final   Enterobacteriaceae species DETECTED (A) NOT DETECTED Final    Comment: CRITICAL RESULT CALLED TO, READ BACK BY AND VERIFIED WITH: C BALL,PHARMD AT 1405 10/17/16 BY L BENFIELD    Enterobacter cloacae complex NOT DETECTED NOT DETECTED Final   Escherichia coli DETECTED (A) NOT DETECTED Final    Comment: CRITICAL RESULT CALLED TO, READ BACK BY AND VERIFIED WITH: C BALL,PHARMD AT 1405 10/17/16 BY L BENFIELD    Klebsiella oxytoca NOT DETECTED NOT DETECTED Final   Klebsiella pneumoniae NOT DETECTED NOT DETECTED Final     Proteus species NOT DETECTED NOT DETECTED Final   Serratia marcescens NOT DETECTED NOT DETECTED Final   Carbapenem resistance NOT DETECTED NOT DETECTED Final   Haemophilus influenzae NOT DETECTED NOT DETECTED Final  Neisseria meningitidis NOT DETECTED NOT DETECTED Final   Pseudomonas aeruginosa NOT DETECTED NOT DETECTED Final   Candida albicans NOT DETECTED NOT DETECTED Final   Candida glabrata NOT DETECTED NOT DETECTED Final   Candida krusei NOT DETECTED NOT DETECTED Final   Candida parapsilosis NOT DETECTED NOT DETECTED Final   Candida tropicalis NOT DETECTED NOT DETECTED Final  Aerobic/Anaerobic Culture (surgical/deep wound)     Status: None (Preliminary result)   Collection Time: 10/19/16  4:28 PM  Result Value Ref Range Status   Specimen Description GALL BLADDER  Final   Special Requests Normal  Final   Gram Stain   Final    ABUNDANT WBC PRESENT,BOTH PMN AND MONONUCLEAR NO ORGANISMS SEEN    Culture   Final    FEW ESCHERICHIA COLI NO ANAEROBES ISOLATED; CULTURE IN PROGRESS FOR 5 DAYS    Report Status PENDING  Incomplete   Organism ID, Bacteria ESCHERICHIA COLI  Final      Susceptibility   Escherichia coli - MIC*    AMPICILLIN <=2 SENSITIVE Sensitive     CEFAZOLIN <=4 SENSITIVE Sensitive     CEFEPIME <=1 SENSITIVE Sensitive     CEFTAZIDIME <=1 SENSITIVE Sensitive     CEFTRIAXONE <=1 SENSITIVE Sensitive     CIPROFLOXACIN <=0.25 SENSITIVE Sensitive     GENTAMICIN <=1 SENSITIVE Sensitive     IMIPENEM <=0.25 SENSITIVE Sensitive     TRIMETH/SULFA <=20 SENSITIVE Sensitive     AMPICILLIN/SULBACTAM <=2 SENSITIVE Sensitive     PIP/TAZO <=4 SENSITIVE Sensitive     Extended ESBL NEGATIVE Sensitive     * FEW ESCHERICHIA COLI  Culture, blood (Routine X 2) w Reflex to ID Panel     Status: None (Preliminary result)   Collection Time: 10/20/16  6:50 PM  Result Value Ref Range Status   Specimen Description BLOOD LEFT HAND  Final   Special Requests BLOOD AEROBIC BOTTLE 10 CC, BANA 5  CC  Final   Culture NO GROWTH 2 DAYS  Final   Report Status PENDING  Incomplete  Culture, blood (Routine X 2) w Reflex to ID Panel     Status: None (Preliminary result)   Collection Time: 10/20/16  6:58 PM  Result Value Ref Range Status   Specimen Description BLOOD RIGHT HAND  Final   Special Requests BLOOD AEROBIC BOTTLE 5 CC  Final   Culture NO GROWTH 2 DAYS  Final   Report Status PENDING  Incomplete     Radiology Studies: Dg Chest Port 1 View  Result Date: 10/21/2016 CLINICAL DATA:  Fever.  Recent gallbladder surgery . EXAM: PORTABLE CHEST 1 VIEW COMPARISON:  10/16/2016 . FINDINGS: Heart size stable. Low lung volumes with basilar atelectasis again noted. No pleural effusion or pneumothorax. IMPRESSION: Low lung volumes with basilar atelectasis again noted. Electronically Signed   By: Marcello Moores  Register   On: 10/21/2016 07:34    Scheduled Meds: . ampicillin-sulbactam (UNASYN) IV  3 g Intravenous Q12H  . aspirin EC  81 mg Oral Daily  . docusate sodium  100 mg Oral BID  . hydrALAZINE  25 mg Oral QPM  . insulin aspart  0-5 Units Subcutaneous QHS  . insulin aspart  0-9 Units Subcutaneous TID WC  . insulin detemir  8 Units Subcutaneous BID  . polyethylene glycol  17 g Oral Daily  . sodium chloride flush  10-40 mL Intracatheter Q12H  . verapamil  240 mg Oral QHS   Continuous Infusions: . heparin 1,700 Units/hr (10/22/16 1322)     LOS:  9 days   Chipper Oman, MD [facility] Pager [phone]  If 7PM-7AM, please contact night-coverage [email or web address] Password [facility] 10/22/2016, 3:57 PM

## 2016-10-22 NOTE — Progress Notes (Signed)
Physical Therapy Treatment Patient Details Name: Stephen LEADBEATER Sr. MRN: [id number] DOB: 05/07/1941 Today's Date: 10/22/2016    History of Present Illness  Stephen BARBIERI Sr. is a 75 y.o. male with medical history significant of HTN and DM type II; who presents with complaints of leg swelling and inability to walk.  S/P cholecystotomy tube.    PT Comments    Pt progressing slowly.  He struggles to mobilize in bed, needs 2 person assist to stand today and is unable to muster the strength and energy to ambulate.   Follow Up Recommendations  SNF     Equipment Recommendations  None recommended by PT    Recommendations for Other Services       Precautions / Restrictions Precautions Precautions: Fall Restrictions Weight Bearing Restrictions: No    Mobility  Bed Mobility Overal bed mobility: Needs Assistance Bed Mobility: Supine to Sit;Sit to Supine     Supine to sit: HOB elevated;Mod assist Sit to supine: Mod assist   General bed mobility comments: assist with trunk and LE's in and out of bed.  Pt not able to assist through the flank pain.  Transfers Overall transfer level: Needs assistance Equipment used: Rolling walker (2 wheeled) Transfers: Sit to/from Stand Sit to Stand: Max assist;+2 physical assistance         General transfer comment: needed assist of 2 to assist with lift and coming forward.  cues for hand placement  Ambulation/Gait             General Gait Details: pt unable.   Stairs            Wheelchair Mobility    Modified Rankin (Stroke Patients Only)       Balance Overall balance assessment: Needs assistance   Sitting balance-Leahy Scale: Fair Sitting balance - Comments: starting to list L due to the curvature on the mattress.   Standing balance support: Bilateral upper extremity supported Standing balance-Leahy Scale: Zero Standing balance comment: reliant on the RW and external support.                     Cognition Arousal/Alertness: Awake/alert Behavior During Therapy: WFL for tasks assessed/performed Overall Cognitive Status: Within Functional Limits for tasks assessed                      Exercises      General Comments        Pertinent Vitals/Pain Pain Assessment: Faces Faces Pain Scale: Hurts even more Pain Descriptors / Indicators: Sharp;Sore Pain Intervention(s): Limited activity within patient's tolerance;Monitored during session    Home Living                      Prior Function            PT Goals (current goals can now be found in the care plan section) Acute Rehab PT Goals Patient Stated Goal: home to my daughter's home able to walk. PT Goal Formulation: With patient Time For Goal Achievement: 10/27/16 Potential to Achieve Goals: Fair Progress towards PT goals: Not progressing toward goals - comment (not able to ambulate and can't be encouraged sit in chair.)    Frequency    Min 3X/week      PT Plan Current plan remains appropriate    Co-evaluation             End of Session   Activity Tolerance: Patient limited by fatigue;Patient limited by pain  Patient left: in bed;with call bell/phone within reach     Time: 1415-1440 PT Time Calculation (min) (ACUTE ONLY): 25 min  Charges:  $Therapeutic Activity: 23-37 mins                    G CodesTessie Fass Stephen Rogers 10/22/2016, 2:57 PM 10/22/2016  Stephen Rogers, PT [phone]  (pager)

## 2016-10-22 NOTE — Clinical Social Work Placement (Signed)
   CLINICAL SOCIAL WORK PLACEMENT  NOTE FACILITY CHOICES LISTED BELOW  Date:  10/22/2016  Patient Details  Name: Stephen MCCORMACK Sr. MRN: [id number] Date of Birth: 1940-11-28  Clinical Social Work is seeking post-discharge placement for this patient at the [place] level of care (*CSW will initial, date and re-position this form in  chart as items are completed):  Yes   Patient/family provided with [place] Work Department's list of facilities offering this level of care within the geographic area requested by the patient (or if unable, by the patient's family).  Yes   Patient/family informed of their freedom to choose among providers that offer the needed level of care, that participate in Medicare, Medicaid or managed care program needed by the patient, have an available bed and are willing to accept the patient.  Yes   Patient/family informed of [place]'s ownership interest in [facility] and [facility], as well as of the fact that they are under no obligation to receive care at these facilities.  PASRR submitted to EDS on 10/17/16     PASRR number received on 10/17/16     Existing PASRR number confirmed on       FL2 transmitted to all facilities in geographic area requested by pt/family on 10/17/16     FL2 transmitted to all facilities within larger geographic area on 10/17/16     Patient informed that his/her managed care company has contracts with or will negotiate with certain facilities, including the following:         10/22/16 - Patient/family informed of bed offers received.  Patient chooses bed at  [facility] recommends and patient chooses bed at      Patient to be transferred to   on  .  Patient to be transferred to facility by       Patient family notified on   of transfer.  Name of family member notified:        PHYSICIAN       Additional Comment:     _______________________________________________ Sable Feil, LCSW 10/22/2016, 6:10 PM

## 2016-10-22 NOTE — Care Management Important Message (Signed)
Important Message  Patient Details  Name: Stephen PEARDON Sr. MRN: [id number] Date of Birth: 1941/06/08   Medicare Important Message Given:  Yes    [name] Abena 10/22/2016, 1:18 PM

## 2016-10-22 NOTE — Progress Notes (Signed)
[facility] Progress Note     Subjective: Persistent fevers however patient states he feels he is improving. Abdominal pain controlled - some soreness around drain site. Having night sweats. Denies nausea, vomiting, and tolerating PO. Pulling ~250 cc on IS. Last BM 11/23.  Objective: Vital signs in last 24 hours: Temp:  [98 F (36.7 C)-102.3 F (39.1 C)] 102.3 F (39.1 C) (12/01 0901) Pulse Rate:  [104-113] 113 (12/01 0615) Resp:  [17-20] 20 (12/01 0615) BP: (135-145)/(63-66) 135/63 (12/01 0615) SpO2:  [83 %-98 %] 94 % (12/01 0623) Weight:  [199 lb 4.7 oz (90.4 kg)] 199 lb 4.7 oz (90.4 kg) (11/30 2151) Last BM Date: 10/14/16  Intake/Output from previous day: 11/30 0701 - 12/01 0700 In: 1694.1 [P.O.:1200; I.V.:374.1; IV Piggyback:100] Out: 1810 [Urine:1750; Drains:60] Intake/Output this shift: No intake/output data recorded.  PE: Gen:  Alert, NAD, pleasant Pulm:  CTA, decreased breath sounds in bilateral based.  Abd: Soft, RUQ tenderness, mild distention, +BS, Cholecystostomy drain with serosanguinous output Ext:  No erythema, edema, or tenderness   Lab Results:   Recent Labs  10/21/16 0603 10/22/16 0426  WBC 17.4* 19.9*  HGB 8.3* 7.9*  HCT 26.1* 24.7*  PLT 458* 462*   BMET  Recent Labs  10/21/16 0603 10/22/16 0426  NA 135 135  K 4.1 3.8  CL 97* 96*  CO2 27 27  GLUCOSE 174* 230*  BUN 36* 41*  CREATININE 2.37* 2.35*  CALCIUM 7.9* 7.9*   PT/INR No results for input(s): LABPROT, INR in the last 72 hours. CMP     Component Value Date/Time   NA 135 10/22/2016 0426   K 3.8 10/22/2016 0426   CL 96 (L) 10/22/2016 0426   CO2 27 10/22/2016 0426   GLUCOSE 230 (H) 10/22/2016 0426   BUN 41 (H) 10/22/2016 0426   CREATININE 2.35 (H) 10/22/2016 0426   CALCIUM 7.9 (L) 10/22/2016 0426   PROT 6.6 10/21/2016 0603   ALBUMIN 1.6 (L) 10/21/2016 0603   AST 62 (H) 10/21/2016 0603   ALT 72 (H) 10/21/2016 0603   ALKPHOS 257 (H) 10/21/2016 0603   BILITOT  1.2 10/21/2016 0603   GFRNONAA 25 (L) 10/22/2016 0426   GFRAA 29 (L) 10/22/2016 0426   Lipase     Component Value Date/Time   LIPASE 36 10/07/2016 2153    Studies/Results: Dg Chest Port 1 View  Result Date: 10/21/2016 CLINICAL DATA:  Fever.  Recent gallbladder surgery . EXAM: PORTABLE CHEST 1 VIEW COMPARISON:  10/16/2016 . FINDINGS: Heart size stable. Low lung volumes with basilar atelectasis again noted. No pleural effusion or pneumothorax. IMPRESSION: Low lung volumes with basilar atelectasis again noted. Electronically Signed   By: Marcello Moores  Register   On: 10/21/2016 07:34    Anti-infectives: Anti-infectives    Start     Dose/Rate Route Frequency Ordered Stop   10/21/16 2100  Ampicillin-Sulbactam (UNASYN) 3 g in sodium chloride 0.9 % 100 mL IVPB     3 g 200 mL/hr over 30 Minutes Intravenous Every 12 hours 10/21/16 1548     10/19/16 0915  ertapenem (INVANZ) 1 g in sodium chloride 0.9 % 50 mL IVPB  Status:  Discontinued     1 g 100 mL/hr over 30 Minutes Intravenous Every 24 hours 10/19/16 0909 10/19/16 0910   10/17/16 0400  piperacillin-tazobactam (ZOSYN) IVPB 3.375 g  Status:  Discontinued     3.375 g 12.5 mL/hr over 240 Minutes Intravenous Every 8 hours 10/16/16 2122 10/21/16 1519   10/16/16 2200  piperacillin-tazobactam (  ZOSYN) IVPB 3.375 g     3.375 g 100 mL/hr over 30 Minutes Intravenous  Once 10/16/16 2122 10/16/16 2226     Assessment/Plan Acute cholecystitis, DVT S/P percutaneous choelcystostomy tube placement by IR 11/28 - Cx: few E.Coli no anaerobes -- started on Unasyn - WBC increased to 19.9 today - continue regular diet - encourage mobilization with therapies. - Will need surgical follow up with Dr. Rolm Bookbinder in 4 weeks as well as follow up per IR at the drain clinic for perc cholecystomy.    Fever - 102.3 today; CXR yesterday bibasilar atelectasis no effusion;   Plan: continue IV abx and encourage IS/mobilization with therapies. Bowel regiment for  constipation. Discussed fever/leukocytosis with hospitalist: ID consult, CT abd/pelvis, urine culture    LOS: 9 days    Jill Alexanders , [facility] 10/22/2016, 9:50 AM Pager: [phone] Consults: [phone] Mon-Fri 7:00 am-4:30 pm Sat-Sun 7:00 am-11:30 am

## 2016-10-23 DIAGNOSIS — R509 Fever, unspecified: Secondary | ICD-10-CM

## 2016-10-23 DIAGNOSIS — K819 Cholecystitis, unspecified: Secondary | ICD-10-CM

## 2016-10-23 LAB — CBC WITH DIFFERENTIAL/PLATELET
Basophils Absolute: 0 10*3/uL (ref 0.0–0.1)
Basophils Relative: 0 %
Eosinophils Absolute: 0.2 10*3/uL (ref 0.0–0.7)
Eosinophils Relative: 1 %
HEMATOCRIT: 22.5 % — AB (ref 39.0–52.0)
HEMOGLOBIN: 7.2 g/dL — AB (ref 13.0–17.0)
LYMPHS ABS: 2.2 10*3/uL (ref 0.7–4.0)
Lymphocytes Relative: 12 %
MCH: 28.2 pg (ref 26.0–34.0)
MCHC: 32 g/dL (ref 30.0–36.0)
MCV: 88.2 fL (ref 78.0–100.0)
MONOS PCT: 8 %
Monocytes Absolute: 1.5 10*3/uL — ABNORMAL HIGH (ref 0.1–1.0)
NEUTROS ABS: 14.1 10*3/uL — AB (ref 1.7–7.7)
NEUTROS PCT: 79 %
Platelets: 452 10*3/uL — ABNORMAL HIGH (ref 150–400)
RBC: 2.55 MIL/uL — ABNORMAL LOW (ref 4.22–5.81)
RDW: 14.4 % (ref 11.5–15.5)
WBC: 18 10*3/uL — ABNORMAL HIGH (ref 4.0–10.5)

## 2016-10-23 LAB — GLUCOSE, CAPILLARY
GLUCOSE-CAPILLARY: 195 mg/dL — AB (ref 65–99)
Glucose-Capillary: 194 mg/dL — ABNORMAL HIGH (ref 65–99)
Glucose-Capillary: 191 mg/dL — ABNORMAL HIGH (ref 65–99)
Glucose-Capillary: 172 mg/dL — ABNORMAL HIGH (ref 65–99)

## 2016-10-23 LAB — BASIC METABOLIC PANEL
Anion gap: 13 (ref 5–15)
BUN: 36 mg/dL — AB (ref 6–20)
CHLORIDE: 95 mmol/L — AB (ref 101–111)
CO2: 27 mmol/L (ref 22–32)
CREATININE: 1.81 mg/dL — AB (ref 0.61–1.24)
Calcium: 7.8 mg/dL — ABNORMAL LOW (ref 8.9–10.3)
GFR calc Af Amer: 40 mL/min — ABNORMAL LOW (ref >60)
GFR calc non Af Amer: 34 mL/min — ABNORMAL LOW (ref >60)
GLUCOSE: 207 mg/dL — AB (ref 65–99)
Potassium: 3.8 mmol/L (ref 3.5–5.1)
Sodium: 135 mmol/L (ref 135–145)

## 2016-10-23 LAB — APTT
APTT: 43 s — AB (ref 24–36)
aPTT: 75 seconds — ABNORMAL HIGH (ref 24–36)

## 2016-10-23 LAB — HEPARIN LEVEL (UNFRACTIONATED): Heparin Unfractionated: 0.69 IU/mL (ref 0.30–0.70)

## 2016-10-23 MED ORDER — CIPROFLOXACIN HCL 500 MG PO TABS
ORAL_TABLET | ORAL | Status: AC
  Administered 2016-10-23: 13:00:00
  Filled 2016-10-23: qty 1

## 2016-10-23 MED ORDER — CIPROFLOXACIN HCL 500 MG PO TABS
500.0000 mg | ORAL_TABLET | Freq: Every day | ORAL | Status: DC
  Filled 2016-10-23: qty 1

## 2016-10-23 MED ORDER — METRONIDAZOLE 500 MG PO TABS
500.0000 mg | ORAL_TABLET | Freq: Three times a day (TID) | ORAL | Status: DC
  Administered 2016-10-23 – 2016-10-26 (×10): 500 mg via ORAL
  Filled 2016-10-23 (×10): qty 1

## 2016-10-23 MED ORDER — FLEET ENEMA 7-19 GM/118ML RE ENEM
1.0000 | ENEMA | Freq: Once | RECTAL | Status: DC
  Filled 2016-10-23: qty 1

## 2016-10-23 MED ORDER — CIPROFLOXACIN HCL 500 MG PO TABS
500.0000 mg | ORAL_TABLET | Freq: Two times a day (BID) | ORAL | Status: DC
  Administered 2016-10-23 – 2016-10-26 (×7): 500 mg via ORAL
  Filled 2016-10-23 (×2): qty 1
  Filled 2016-10-23: qty 2
  Filled 2016-10-23 (×3): qty 1

## 2016-10-23 NOTE — Progress Notes (Signed)
PROGRESS NOTE [address]   Stephen AMADOR Sr.   [id number] DOB: 10-Jun-1941  DOA: 10/12/2016 PCP: [name], MD   Brief Narrative:  75 year old African-American male with a past medical history of hypertension, diabetes, presented to the hospital with complaints of leg swelling and inability to walk. Patient was recently hospitalized to [facility] from November 16 November 20 for acute renal failure. He was given IV fluids. He went home and noted that his legs were swollen. His Lasix was not resumed. He found it difficult to ambulate so he presented to the hospital here. He was hospitalized for further management. Patient diuresed well. He was also found to have acute DVT for which he was placed on anticoagulation. His LFTs were improving, but then he started spiking fever. He was seen by gastroenterology. Subsequently, general surgery was consulted as well for suspected cholecystitis. Patient is s/p perc cholecystostomy   Subjective: Patient feeling better today, continues to not have a Bm. CT abdomen showed gallbladder hematoma.    Assessment & Plan: Fever with increase in WBC - unknown source at this time - repeated blood cultures from 11/29 so far negative, CXR no PNA, full physical exam does not reveal any lesions. Drainage from chole is bloody.While atelectasis can be the cause of the fever, I suspect that this could be form his gallbladder ? From the hematoma CT abdomen shows gallbladder hematoma  ID input appreciated  Abx switched to cipro/flagyl to r/o drug fever  Encouraged incentive spirometry use Urine culture pending  If spike fever again re-culture    Escherichia coli bacteremia and sepsis - pansensitive, sepsis physiology has resolved Patient spiked temperature of 103 while he is in the hospital, blood cultures showed Escherichia coli.d. Treated with IV fluids and antibiotics,  Repeated blood cultures so far no growth Pansensitive Escherichia coli,  can be discharged on oral amoxicillin or Cipro for total of 2 weeks from the day of second blood cultures, 10/16/2016 - 10/30/2016 See above   Acute cholecystitis, status post percutaneous cholecystostomy with tubes in place.  Initially with Fever, transaminitis and jaundice,  Abdominal ultrasound showed multiple gallstones. HIDA scan showing cystic duct obstruction consistent with acute cholecystitis. IR recommendations appreciated, can be d/c with drains  Gallbladder cx show E coli pansensitive  Switched to cipro/flagyl - r/o drug fever/leukocytosis   Chronic kidney disease stage 3 - Acute kidney injury - 2/2 dehydration in combination with ACE and Lasix  Continue IVF decrease rate - monitor for signs of fluid overload  Continue to hold Lasix and ACEi Repeat BMP in AM   Normocytic anemia - has been dropping likely 2/2 to gallbladder hematoma and hemodilution.  Today 7.2 asymptomatic  Will continue to monitor If Hb <7 or develop symptoms with current level will transfuse   Transaminitis - was trending down  Likely secondary to acute cholecystitis Check LFTs in AM   Acute Distal left lower extremity DVT Likely due to recent hospitalization and sedentary status. Patient continues on IV heparin If Creatinine get better will transition to heparin - pharmacy helping managing a/c status  If not will have to start Coumadin   Elevated troponin/abnormal EKG Patient with nonspecific changes on EKG. No acute ischemic changes noted. Patient denies any chest pains. Echocardiogram does not show any wall motion abnormalities. G1DD. No further work up at this time  Essential hypertension- stable  Holding ACEi today  Continue hydralazine and verapamil  Monitor BP   Diabetes mellitus type 2 CBG's increasing  Continue SSI  Monitor CBG's Will start Levemir 8 units BID   Hyperlipidemia Will resume statin upon discharge    Normocytic anemia.   DVT prophylaxis: Heparin  Code Status:  Full  Family Communication: None at bedside  Disposition Plan: Anticipate discharge to SNF when medically stable   Consultants:   Surgery   IR   Procedures:   Percutaneous Cholecystostomy   Antimicrobials:   Zosyn 11/25   Objective: Vitals:   10/23/16 0459 10/23/16 0700 10/23/16 0814 10/23/16 0920  BP: 127/63   (!) 142/76  Pulse: (!) 102   (!) 102  Resp: 18   20  Temp: 99.5 F (37.5 C) 98.6 F (37 C) 99.1 F (37.3 C) 99.1 F (37.3 C)  TempSrc: Oral  Oral Oral  SpO2: 91%   94%  Weight:      Height:        Intake/Output Summary (Last 24 hours) at 10/23/16 1339 Last data filed at 10/23/16 1303  Gross per 24 hour  Intake          2140.35 ml  Output             1180 ml  Net           960.35 ml   Filed Weights   10/20/16 2104 10/21/16 2151 10/22/16 2123  Weight: 90.3 kg (199 lb 1.2 oz) 90.4 kg (199 lb 4.7 oz) 87.4 kg (192 lb 10.9 oz)    Examination:  General exam: Awake and oriented   Respiratory system:  Air entry diminished at the b/l bases  Cardiovascular system: S1 & S2 heard, RRR.  Gastrointestinal system: Obese, Soft NTND   Extremities: 2+ pedal edema Skin: No lesion or ulcers   Data Reviewed: I have personally reviewed following labs and imaging studies  CBC:  Recent Labs Lab 10/17/16 0917 10/18/16 0544 10/19/16 0405 10/20/16 0548 10/21/16 0603 10/22/16 0426 10/23/16 0436  WBC 21.0* 21.8* 17.2* 18.0* 17.4* 19.9* 18.0*  NEUTROABS 18.5* 18.8*  --   --   --   --  14.1*  HGB 10.3* 10.2* 9.2* 8.8* 8.3* 7.9* 7.2*  HCT 31.3* 30.9* 28.8* 27.3* 26.1* 24.7* 22.5*  MCV 87.9 88.3 88.9 88.9 89.1 88.8 88.2  PLT 390 370 354 393 458* 462* [id number]*   Basic Metabolic Panel:  Recent Labs Lab 10/18/16 0544 10/20/16 0548 10/21/16 0603 10/22/16 0426 10/23/16 0436  NA 135 133* 135 135 135  K 3.8 3.7 4.1 3.8 3.8  CL 97* 94* 97* 96* 95*  CO2 26 26 27 27 27   GLUCOSE 150* 216* 174* 230* 207*  BUN 24* 33* 36* 41* 36*  CREATININE 1.81* 2.03* 2.37* 2.35*  1.81*  CALCIUM 8.0* 7.9* 7.9* 7.9* 7.8*   GFR: Estimated Creatinine Clearance: 35.4 mL/min (by C-G formula based on SCr of 1.81 mg/dL (H)). Liver Function Tests:  Recent Labs Lab 10/17/16 0917 10/18/16 0544 10/20/16 0548 10/21/16 0603  AST 260* 117* 69* 62*  ALT 224* 161* 83* 72*  ALKPHOS 581* 466* 305* 257*  BILITOT 2.7* 2.1* 1.3* 1.2  PROT 6.3* 6.3* 6.2* 6.6  ALBUMIN 1.8* 1.9* 1.6* 1.6*   No results for input(s): LIPASE, AMYLASE in the last 168 hours. No results for input(s): AMMONIA in the last 168 hours. Coagulation Profile: No results for input(s): INR, PROTIME in the last 168 hours. Cardiac Enzymes: No results for input(s): CKTOTAL, CKMB, CKMBINDEX, TROPONINI in the last 168 hours. BNP (last 3 results) No results for input(s): PROBNP in the last 8760 hours. HbA1C: No results for  input(s): HGBA1C in the last 72 hours. CBG:  Recent Labs Lab 10/22/16 1122 10/22/16 1616 10/22/16 2057 10/23/16 0751 10/23/16 1157  GLUCAP 284* 329* 340* 194* 195*   Lipid Profile: No results for input(s): CHOL, HDL, LDLCALC, TRIG, CHOLHDL, LDLDIRECT in the last 72 hours. Thyroid Function Tests: No results for input(s): TSH, T4TOTAL, FREET4, T3FREE, THYROIDAB in the last 72 hours. Anemia Panel: No results for input(s): VITAMINB12, FOLATE, FERRITIN, TIBC, IRON, RETICCTPCT in the last 72 hours. Sepsis Labs:  Recent Labs Lab 10/16/16 2200  LATICACIDVEN 0.9    Recent Results (from the past 240 hour(s))  Culture, blood (routine x 2)     Status: None   Collection Time: 10/16/16 10:16 PM  Result Value Ref Range Status   Specimen Description BLOOD LEFT HAND  Final   Special Requests BOTTLES DRAWN AEROBIC AND ANAEROBIC 5ML  Final   Culture NO GROWTH 5 DAYS  Final   Report Status 10/22/2016 FINAL  Final  Culture, blood (routine x 2)     Status: Abnormal   Collection Time: 10/16/16 10:25 PM  Result Value Ref Range Status   Specimen Description BLOOD RIGHT HAND  Final   Special  Requests IN PEDIATRIC BOTTLE 3ML  Final   Culture  Setup Time   Final    GRAM NEGATIVE RODS IN PEDIATRIC BOTTLE CRITICAL RESULT CALLED TO, READ BACK BY AND VERIFIED WITH: C BALL,PHARMD AT 1405 10/17/16 BY L BENFIELD    Culture ESCHERICHIA COLI (A)  Final   Report Status 10/19/2016 FINAL  Final   Organism ID, Bacteria ESCHERICHIA COLI  Final      Susceptibility   Escherichia coli - MIC*    AMPICILLIN <=2 SENSITIVE Sensitive     CEFAZOLIN <=4 SENSITIVE Sensitive     CEFEPIME <=1 SENSITIVE Sensitive     CEFTAZIDIME <=1 SENSITIVE Sensitive     CEFTRIAXONE <=1 SENSITIVE Sensitive     CIPROFLOXACIN <=0.25 SENSITIVE Sensitive     GENTAMICIN <=1 SENSITIVE Sensitive     IMIPENEM <=0.25 SENSITIVE Sensitive     TRIMETH/SULFA <=20 SENSITIVE Sensitive     AMPICILLIN/SULBACTAM <=2 SENSITIVE Sensitive     PIP/TAZO <=4 SENSITIVE Sensitive     Extended ESBL NEGATIVE Sensitive     * ESCHERICHIA COLI  Blood Culture ID Panel (Reflexed)     Status: Abnormal   Collection Time: 10/16/16 10:25 PM  Result Value Ref Range Status   Enterococcus species NOT DETECTED NOT DETECTED Final   Listeria monocytogenes NOT DETECTED NOT DETECTED Final   Staphylococcus species NOT DETECTED NOT DETECTED Final   Staphylococcus aureus NOT DETECTED NOT DETECTED Final   Streptococcus species NOT DETECTED NOT DETECTED Final   Streptococcus agalactiae NOT DETECTED NOT DETECTED Final   Streptococcus pneumoniae NOT DETECTED NOT DETECTED Final   Streptococcus pyogenes NOT DETECTED NOT DETECTED Final   Acinetobacter baumannii NOT DETECTED NOT DETECTED Final   Enterobacteriaceae species DETECTED (A) NOT DETECTED Final    Comment: CRITICAL RESULT CALLED TO, READ BACK BY AND VERIFIED WITH: C BALL,PHARMD AT 1405 10/17/16 BY L BENFIELD    Enterobacter cloacae complex NOT DETECTED NOT DETECTED Final   Escherichia coli DETECTED (A) NOT DETECTED Final    Comment: CRITICAL RESULT CALLED TO, READ BACK BY AND VERIFIED WITH: C  BALL,PHARMD AT 1405 10/17/16 BY L BENFIELD    Klebsiella oxytoca NOT DETECTED NOT DETECTED Final   Klebsiella pneumoniae NOT DETECTED NOT DETECTED Final   Proteus species NOT DETECTED NOT DETECTED Final   Serratia marcescens NOT  DETECTED NOT DETECTED Final   Carbapenem resistance NOT DETECTED NOT DETECTED Final   Haemophilus influenzae NOT DETECTED NOT DETECTED Final   Neisseria meningitidis NOT DETECTED NOT DETECTED Final   Pseudomonas aeruginosa NOT DETECTED NOT DETECTED Final   Candida albicans NOT DETECTED NOT DETECTED Final   Candida glabrata NOT DETECTED NOT DETECTED Final   Candida krusei NOT DETECTED NOT DETECTED Final   Candida parapsilosis NOT DETECTED NOT DETECTED Final   Candida tropicalis NOT DETECTED NOT DETECTED Final  Aerobic/Anaerobic Culture (surgical/deep wound)     Status: None (Preliminary result)   Collection Time: 10/19/16  4:28 PM  Result Value Ref Range Status   Specimen Description GALL BLADDER  Final   Special Requests Normal  Final   Gram Stain   Final    ABUNDANT WBC PRESENT,BOTH PMN AND MONONUCLEAR NO ORGANISMS SEEN    Culture   Final    FEW ESCHERICHIA COLI NO ANAEROBES ISOLATED; CULTURE IN PROGRESS FOR 5 DAYS    Report Status PENDING  Incomplete   Organism ID, Bacteria ESCHERICHIA COLI  Final      Susceptibility   Escherichia coli - MIC*    AMPICILLIN <=2 SENSITIVE Sensitive     CEFAZOLIN <=4 SENSITIVE Sensitive     CEFEPIME <=1 SENSITIVE Sensitive     CEFTAZIDIME <=1 SENSITIVE Sensitive     CEFTRIAXONE <=1 SENSITIVE Sensitive     CIPROFLOXACIN <=0.25 SENSITIVE Sensitive     GENTAMICIN <=1 SENSITIVE Sensitive     IMIPENEM <=0.25 SENSITIVE Sensitive     TRIMETH/SULFA <=20 SENSITIVE Sensitive     AMPICILLIN/SULBACTAM <=2 SENSITIVE Sensitive     PIP/TAZO <=4 SENSITIVE Sensitive     Extended ESBL NEGATIVE Sensitive     * FEW ESCHERICHIA COLI  Culture, blood (Routine X 2) w Reflex to ID Panel     Status: None (Preliminary result)   Collection  Time: 10/20/16  6:50 PM  Result Value Ref Range Status   Specimen Description BLOOD LEFT HAND  Final   Special Requests BLOOD AEROBIC BOTTLE 10 CC, BANA 5 CC  Final   Culture NO GROWTH 2 DAYS  Final   Report Status PENDING  Incomplete  Culture, blood (Routine X 2) w Reflex to ID Panel     Status: None (Preliminary result)   Collection Time: 10/20/16  6:58 PM  Result Value Ref Range Status   Specimen Description BLOOD RIGHT HAND  Final   Special Requests BLOOD AEROBIC BOTTLE 5 CC  Final   Culture NO GROWTH 2 DAYS  Final   Report Status PENDING  Incomplete     Radiology Studies: Ct Abdomen Pelvis Wo Contrast  Result Date: 10/22/2016 CLINICAL DATA:  75 y/o M; right-sided abdominal pain after percutaneous cholecystostomy. EXAM: CT ABDOMEN AND PELVIS WITHOUT CONTRAST TECHNIQUE: Multidetector CT imaging of the abdomen and pelvis was performed following the standard protocol without IV contrast. COMPARISON:  10/17/2016 abdominal ultrasound. 10/16/2016 abdominal MRI. 10/13/2016 CT abdomen. FINDINGS: Lower chest: Small right greater than left pleural effusions with dependent opacities likely representing atelectasis. Hepatobiliary: No focal liver abnormality. There is a cholecystostomy tube with pigtail in the upper gallbladder fundus. The gallbladder is markedly enlarged small foci of air in a large high attenuation collection likely representing a hematoma. Pancreas: Unremarkable. No pancreatic ductal dilatation or surrounding inflammatory changes. Spleen: Normal in size without focal abnormality. Adrenals/Urinary Tract: Adrenal glands are unremarkable. Stable left kidney interpolar and right kidney upper pole cysts. Kidneys are otherwise normal, without renal calculi, focal lesion, or  hydronephrosis. Bladder is unremarkable. Stomach/Bowel: Stomach is within normal limits. Appendix appears normal. No evidence of bowel wall thickening, distention, or inflammatory changes. Vascular/Lymphatic: Aortic  atherosclerosis. No enlarged abdominal or pelvic lymph nodes. Reproductive: Prostate is unremarkable. Other: Small volume of right hemi abdomen ascites. Musculoskeletal: Multilevel degenerative changes of the thoracic and lumbar spine and mild lumbar levocurvature. Mild bilateral hip osteoarthrosis. No acute osseous abnormality is evident. IMPRESSION: 1. Cholecystostomy tube within the gallbladder fundus. Gallbladder is markedly enlarged with large luminal hematoma. 2. Small volume of right hemi abdomen ascites is likely reactive. 3. Small bilateral pleural effusions with dependent atelectasis. 4. Aortic atherosclerosis. These results will be called to the ordering clinician or representative by the Radiologist Assistant, and communication documented in the PACS or zVision Dashboard. Electronically Signed   By: Kristine Garbe M.D.   On: 10/22/2016 17:07    Scheduled Meds: . aspirin EC  81 mg Oral Daily  . ciprofloxacin  500 mg Oral BID  . docusate sodium  100 mg Oral BID  . hydrALAZINE  25 mg Oral QPM  . insulin aspart  0-5 Units Subcutaneous QHS  . insulin aspart  0-9 Units Subcutaneous TID WC  . insulin detemir  8 Units Subcutaneous BID  . metroNIDAZOLE  500 mg Oral Q8H  . polyethylene glycol  17 g Oral Daily  . sodium chloride flush  10-40 mL Intracatheter Q12H  . verapamil  240 mg Oral QHS   Continuous Infusions: . sodium chloride 1,000 mL (10/22/16 1830)  . heparin 1,700 Units/hr (10/23/16 0341)     LOS: 10 days   Chipper Oman, MD [facility] Pager [phone]  If 7PM-7AM, please contact night-coverage [email or web address] Password [email or web address] 10/23/2016, 1:39 PM

## 2016-10-23 NOTE — Progress Notes (Signed)
INFECTIOUS DISEASE PROGRESS NOTE  ID: Stephen Bales Sr. is a 75 y.o. male with  Principal Problem:   Bilateral lower extremity edema Active Problems:   Transaminitis   Hyperlipemia   At high risk for fluid overload   Fluid overload   Gait disturbance   Leukocytosis   CKD (chronic kidney disease), stage III   Left leg DVT (HCC)  Subjective: Without complaints.  Denies abd pain.   Abtx:  Anti-infectives    Start     Dose/Rate Route Frequency Ordered Stop   10/23/16 1400  metroNIDAZOLE (FLAGYL) tablet 500 mg     500 mg Oral Every 8 hours 10/23/16 1026     10/23/16 1254  ciprofloxacin (CIPRO) 500 MG tablet    Comments:  Stephen Rogers   : cabinet override      10/23/16 1254 10/23/16 1300   10/23/16 1100  ciprofloxacin (CIPRO) tablet 500 mg     500 mg Oral 2 times daily 10/23/16 1047     10/23/16 1030  ciprofloxacin (CIPRO) tablet 500 mg  Status:  Discontinued     500 mg Oral Daily 10/23/16 1026 10/23/16 1047   10/21/16 2100  Ampicillin-Sulbactam (UNASYN) 3 g in sodium chloride 0.9 % 100 mL IVPB  Status:  Discontinued     3 g 200 mL/hr over 30 Minutes Intravenous Every 12 hours 10/21/16 1548 10/23/16 1026   10/19/16 0915  ertapenem (INVANZ) 1 g in sodium chloride 0.9 % 50 mL IVPB  Status:  Discontinued     1 g 100 mL/hr over 30 Minutes Intravenous Every 24 hours 10/19/16 0909 10/19/16 0910   10/17/16 0400  piperacillin-tazobactam (ZOSYN) IVPB 3.375 g  Status:  Discontinued     3.375 g 12.5 mL/hr over 240 Minutes Intravenous Every 8 hours 10/16/16 2122 10/21/16 1519   10/16/16 2200  piperacillin-tazobactam (ZOSYN) IVPB 3.375 g     3.375 g 100 mL/hr over 30 Minutes Intravenous  Once 10/16/16 2122 10/16/16 2226      Medications:  Scheduled: . aspirin EC  81 mg Oral Daily  . ciprofloxacin  500 mg Oral BID  . docusate sodium  100 mg Oral BID  . hydrALAZINE  25 mg Oral QPM  . insulin aspart  0-5 Units Subcutaneous QHS  . insulin aspart  0-9 Units Subcutaneous TID WC  .  insulin detemir  8 Units Subcutaneous BID  . metroNIDAZOLE  500 mg Oral Q8H  . polyethylene glycol  17 g Oral Daily  . sodium chloride flush  10-40 mL Intracatheter Q12H  . verapamil  240 mg Oral QHS    Objective: Vital signs in last 24 hours: Temp:  [98.6 F (37 C)-99.5 F (37.5 C)] 99.1 F (37.3 C) (12/02 0920) Pulse Rate:  [100-102] 102 (12/02 0920) Resp:  [18-20] 20 (12/02 0920) BP: (127-154)/(63-76) 142/76 (12/02 0920) SpO2:  [91 %-97 %] 94 % (12/02 0920) Weight:  [87.4 kg (192 lb 10.9 oz)] 87.4 kg (192 lb 10.9 oz) (12/01 2123)   General appearance: alert, cooperative and no distress Resp: clear to auscultation bilaterally Cardio: regular rate and rhythm GI: normal findings: bowel sounds normal and soft, non-tender and abnormal findings:  distended  Lab Results  Recent Labs  10/22/16 0426 10/23/16 0436  WBC 19.9* 18.0*  HGB 7.9* 7.2*  HCT 24.7* 22.5*  NA 135 135  K 3.8 3.8  CL 96* 95*  CO2 27 27  BUN 41* 36*  CREATININE 2.35* 1.81*   Liver Panel  Recent Labs  10/21/16 0603  PROT 6.6  ALBUMIN 1.6*  AST 62*  ALT 72*  ALKPHOS 257*  BILITOT 1.2   Sedimentation Rate No results for input(s): ESRSEDRATE in the last 72 hours. C-Reactive Protein No results for input(s): CRP in the last 72 hours.  Microbiology: Recent Results (from the past 240 hour(s))  Culture, blood (routine x 2)     Status: None   Collection Time: 10/16/16 10:16 PM  Result Value Ref Range Status   Specimen Description BLOOD LEFT HAND  Final   Special Requests BOTTLES DRAWN AEROBIC AND ANAEROBIC 5ML  Final   Culture NO GROWTH 5 DAYS  Final   Report Status 10/22/2016 FINAL  Final  Culture, blood (routine x 2)     Status: Abnormal   Collection Time: 10/16/16 10:25 PM  Result Value Ref Range Status   Specimen Description BLOOD RIGHT HAND  Final   Special Requests IN PEDIATRIC BOTTLE 3ML  Final   Culture  Setup Time   Final    GRAM NEGATIVE RODS IN PEDIATRIC BOTTLE CRITICAL RESULT  CALLED TO, READ BACK BY AND VERIFIED WITH: C BALL,PHARMD AT 1405 10/17/16 BY L BENFIELD    Culture ESCHERICHIA COLI (A)  Final   Report Status 10/19/2016 FINAL  Final   Organism ID, Bacteria ESCHERICHIA COLI  Final      Susceptibility   Escherichia coli - MIC*    AMPICILLIN <=2 SENSITIVE Sensitive     CEFAZOLIN <=4 SENSITIVE Sensitive     CEFEPIME <=1 SENSITIVE Sensitive     CEFTAZIDIME <=1 SENSITIVE Sensitive     CEFTRIAXONE <=1 SENSITIVE Sensitive     CIPROFLOXACIN <=0.25 SENSITIVE Sensitive     GENTAMICIN <=1 SENSITIVE Sensitive     IMIPENEM <=0.25 SENSITIVE Sensitive     TRIMETH/SULFA <=20 SENSITIVE Sensitive     AMPICILLIN/SULBACTAM <=2 SENSITIVE Sensitive     PIP/TAZO <=4 SENSITIVE Sensitive     Extended ESBL NEGATIVE Sensitive     * ESCHERICHIA COLI  Blood Culture ID Panel (Reflexed)     Status: Abnormal   Collection Time: 10/16/16 10:25 PM  Result Value Ref Range Status   Enterococcus species NOT DETECTED NOT DETECTED Final   Listeria monocytogenes NOT DETECTED NOT DETECTED Final   Staphylococcus species NOT DETECTED NOT DETECTED Final   Staphylococcus aureus NOT DETECTED NOT DETECTED Final   Streptococcus species NOT DETECTED NOT DETECTED Final   Streptococcus agalactiae NOT DETECTED NOT DETECTED Final   Streptococcus pneumoniae NOT DETECTED NOT DETECTED Final   Streptococcus pyogenes NOT DETECTED NOT DETECTED Final   Acinetobacter baumannii NOT DETECTED NOT DETECTED Final   Enterobacteriaceae species DETECTED (A) NOT DETECTED Final    Comment: CRITICAL RESULT CALLED TO, READ BACK BY AND VERIFIED WITH: C BALL,PHARMD AT 1405 10/17/16 BY L BENFIELD    Enterobacter cloacae complex NOT DETECTED NOT DETECTED Final   Escherichia coli DETECTED (A) NOT DETECTED Final    Comment: CRITICAL RESULT CALLED TO, READ BACK BY AND VERIFIED WITH: C BALL,PHARMD AT 1405 10/17/16 BY L BENFIELD    Klebsiella oxytoca NOT DETECTED NOT DETECTED Final   Klebsiella pneumoniae NOT DETECTED NOT  DETECTED Final   Proteus species NOT DETECTED NOT DETECTED Final   Serratia marcescens NOT DETECTED NOT DETECTED Final   Carbapenem resistance NOT DETECTED NOT DETECTED Final   Haemophilus influenzae NOT DETECTED NOT DETECTED Final   Neisseria meningitidis NOT DETECTED NOT DETECTED Final   Pseudomonas aeruginosa NOT DETECTED NOT DETECTED Final   Candida albicans NOT DETECTED NOT DETECTED  Final   Candida glabrata NOT DETECTED NOT DETECTED Final   Candida krusei NOT DETECTED NOT DETECTED Final   Candida parapsilosis NOT DETECTED NOT DETECTED Final   Candida tropicalis NOT DETECTED NOT DETECTED Final  Aerobic/Anaerobic Culture (surgical/deep wound)     Status: None (Preliminary result)   Collection Time: 10/19/16  4:28 PM  Result Value Ref Range Status   Specimen Description GALL BLADDER  Final   Special Requests Normal  Final   Gram Stain   Final    ABUNDANT WBC PRESENT,BOTH PMN AND MONONUCLEAR NO ORGANISMS SEEN    Culture   Final    FEW ESCHERICHIA COLI NO ANAEROBES ISOLATED; CULTURE IN PROGRESS FOR 5 DAYS    Report Status PENDING  Incomplete   Organism ID, Bacteria ESCHERICHIA COLI  Final      Susceptibility   Escherichia coli - MIC*    AMPICILLIN <=2 SENSITIVE Sensitive     CEFAZOLIN <=4 SENSITIVE Sensitive     CEFEPIME <=1 SENSITIVE Sensitive     CEFTAZIDIME <=1 SENSITIVE Sensitive     CEFTRIAXONE <=1 SENSITIVE Sensitive     CIPROFLOXACIN <=0.25 SENSITIVE Sensitive     GENTAMICIN <=1 SENSITIVE Sensitive     IMIPENEM <=0.25 SENSITIVE Sensitive     TRIMETH/SULFA <=20 SENSITIVE Sensitive     AMPICILLIN/SULBACTAM <=2 SENSITIVE Sensitive     PIP/TAZO <=4 SENSITIVE Sensitive     Extended ESBL NEGATIVE Sensitive     * FEW ESCHERICHIA COLI  Culture, blood (Routine X 2) w Reflex to ID Panel     Status: None (Preliminary result)   Collection Time: 10/20/16  6:50 PM  Result Value Ref Range Status   Specimen Description BLOOD LEFT HAND  Final   Special Requests BLOOD AEROBIC  BOTTLE 10 CC, BANA 5 CC  Final   Culture NO GROWTH 3 DAYS  Final   Report Status PENDING  Incomplete  Culture, blood (Routine X 2) w Reflex to ID Panel     Status: None (Preliminary result)   Collection Time: 10/20/16  6:58 PM  Result Value Ref Range Status   Specimen Description BLOOD RIGHT HAND  Final   Special Requests BLOOD AEROBIC BOTTLE 5 CC  Final   Culture NO GROWTH 3 DAYS  Final   Report Status PENDING  Incomplete    Studies/Results: Ct Abdomen Pelvis Wo Contrast  Result Date: 10/22/2016 CLINICAL DATA:  75 y/o M; right-sided abdominal pain after percutaneous cholecystostomy. EXAM: CT ABDOMEN AND PELVIS WITHOUT CONTRAST TECHNIQUE: Multidetector CT imaging of the abdomen and pelvis was performed following the standard protocol without IV contrast. COMPARISON:  10/17/2016 abdominal ultrasound. 10/16/2016 abdominal MRI. 10/13/2016 CT abdomen. FINDINGS: Lower chest: Small right greater than left pleural effusions with dependent opacities likely representing atelectasis. Hepatobiliary: No focal liver abnormality. There is a cholecystostomy tube with pigtail in the upper gallbladder fundus. The gallbladder is markedly enlarged small foci of air in a large high attenuation collection likely representing a hematoma. Pancreas: Unremarkable. No pancreatic ductal dilatation or surrounding inflammatory changes. Spleen: Normal in size without focal abnormality. Adrenals/Urinary Tract: Adrenal glands are unremarkable. Stable left kidney interpolar and right kidney upper pole cysts. Kidneys are otherwise normal, without renal calculi, focal lesion, or hydronephrosis. Bladder is unremarkable. Stomach/Bowel: Stomach is within normal limits. Appendix appears normal. No evidence of bowel wall thickening, distention, or inflammatory changes. Vascular/Lymphatic: Aortic atherosclerosis. No enlarged abdominal or pelvic lymph nodes. Reproductive: Prostate is unremarkable. Other: Small volume of right hemi abdomen  ascites. Musculoskeletal: Multilevel degenerative changes  of the thoracic and lumbar spine and mild lumbar levocurvature. Mild bilateral hip osteoarthrosis. No acute osseous abnormality is evident. IMPRESSION: 1. Cholecystostomy tube within the gallbladder fundus. Gallbladder is markedly enlarged with large luminal hematoma. 2. Small volume of right hemi abdomen ascites is likely reactive. 3. Small bilateral pleural effusions with dependent atelectasis. 4. Aortic atherosclerosis. These results will be called to the ordering clinician or representative by the Radiologist Assistant, and communication documented in the PACS or zVision Dashboard. Electronically Signed   By: Kristine Garbe M.D.   On: 10/22/2016 17:07     Assessment/Plan: Leukocytosis Fever Cholecystitis  DVT  Diastolic HF (grade 1)  ARF (1.21  --> 2.35 --> 1.81)  Fever better WBC stable.  Cr improved.  Consider 3 weeks of anbx if he continues to do well, could change to po if d/c prior to end of anbx Would continue his anbx, suspect his prev fever were due to his underlying gallbladder disease.  Pt family updated.  Available as needed.   Total days of antibiotics: 7 (unasyn)         Bobby Rumpf Infectious Diseases (pager) [phone] [email or web address] 10/23/2016, 5:05 PM  LOS: 10 days

## 2016-10-23 NOTE — Progress Notes (Signed)
Patient ID: Stephen Bales Sr., male   DOB: [birth year], 75 y.o.   MRN: [id number]  [facility] Surgery Progress Note     Subjective: TMAX 99.1 over night, and WBC slightly down today. Denies any current abdominal pain, nausea, or vomiting. Tolerating diet.  States that he hasn't been using IS regularly. Last BM 11/23  Objective: Vital signs in last 24 hours: Temp:  [98.6 F (37 C)-102.3 F (39.1 C)] 99.1 F (37.3 C) (12/02 0814) Pulse Rate:  [100-103] 102 (12/02 0459) Resp:  [18-20] 18 (12/02 0459) BP: (127-154)/(63-71) 127/63 (12/02 0459) SpO2:  [91 %-97 %] 91 % (12/02 0459) Weight:  [192 lb 10.9 oz (87.4 kg)] 192 lb 10.9 oz (87.4 kg) (12/01 2123) Last BM Date:  (A few days)  Intake/Output from previous day: 12/01 0701 - 12/02 0700 In: 2280.5 [P.O.:780; I.V.:1280.5; IV Piggyback:200] Out: 1180 [Urine:1125; Drains:55] Intake/Output this shift: No intake/output data recorded.  PE: Gen:  Alert, NAD, pleasant Card:  RRR, no M/G/R heard Pulm:  CTAB, no W/R/R Abd: Soft, mild distension, +BS, TTP RUQ/around drain, cholecystostomy drain with serosanguinous output Ext:  No erythema, edema, or tenderness   Lab Results:   Recent Labs  10/22/16 0426 10/23/16 0436  WBC 19.9* 18.0*  HGB 7.9* 7.2*  HCT 24.7* 22.5*  PLT 462* 452*   BMET  Recent Labs  10/22/16 0426 10/23/16 0436  NA 135 135  K 3.8 3.8  CL 96* 95*  CO2 27 27  GLUCOSE 230* 207*  BUN 41* 36*  CREATININE 2.35* 1.81*  CALCIUM 7.9* 7.8*   PT/INR No results for input(s): LABPROT, INR in the last 72 hours. CMP     Component Value Date/Time   NA 135 10/23/2016 0436   K 3.8 10/23/2016 0436   CL 95 (L) 10/23/2016 0436   CO2 27 10/23/2016 0436   GLUCOSE 207 (H) 10/23/2016 0436   BUN 36 (H) 10/23/2016 0436   CREATININE 1.81 (H) 10/23/2016 0436   CALCIUM 7.8 (L) 10/23/2016 0436   PROT 6.6 10/21/2016 0603   ALBUMIN 1.6 (L) 10/21/2016 0603   AST 62 (H) 10/21/2016 0603   ALT 72 (H) 10/21/2016  0603   ALKPHOS 257 (H) 10/21/2016 0603   BILITOT 1.2 10/21/2016 0603   GFRNONAA 34 (L) 10/23/2016 0436   GFRAA 40 (L) 10/23/2016 0436   Lipase     Component Value Date/Time   LIPASE 36 10/07/2016 2153       Studies/Results: Ct Abdomen Pelvis Wo Contrast  Result Date: 10/22/2016 CLINICAL DATA:  75 y/o M; right-sided abdominal pain after percutaneous cholecystostomy. EXAM: CT ABDOMEN AND PELVIS WITHOUT CONTRAST TECHNIQUE: Multidetector CT imaging of the abdomen and pelvis was performed following the standard protocol without IV contrast. COMPARISON:  10/17/2016 abdominal ultrasound. 10/16/2016 abdominal MRI. 10/13/2016 CT abdomen. FINDINGS: Lower chest: Small right greater than left pleural effusions with dependent opacities likely representing atelectasis. Hepatobiliary: No focal liver abnormality. There is a cholecystostomy tube with pigtail in the upper gallbladder fundus. The gallbladder is markedly enlarged small foci of air in a large high attenuation collection likely representing a hematoma. Pancreas: Unremarkable. No pancreatic ductal dilatation or surrounding inflammatory changes. Spleen: Normal in size without focal abnormality. Adrenals/Urinary Tract: Adrenal glands are unremarkable. Stable left kidney interpolar and right kidney upper pole cysts. Kidneys are otherwise normal, without renal calculi, focal lesion, or hydronephrosis. Bladder is unremarkable. Stomach/Bowel: Stomach is within normal limits. Appendix appears normal. No evidence of bowel wall thickening, distention, or inflammatory changes. Vascular/Lymphatic: Aortic atherosclerosis.  No enlarged abdominal or pelvic lymph nodes. Reproductive: Prostate is unremarkable. Other: Small volume of right hemi abdomen ascites. Musculoskeletal: Multilevel degenerative changes of the thoracic and lumbar spine and mild lumbar levocurvature. Mild bilateral hip osteoarthrosis. No acute osseous abnormality is evident. IMPRESSION: 1.  Cholecystostomy tube within the gallbladder fundus. Gallbladder is markedly enlarged with large luminal hematoma. 2. Small volume of right hemi abdomen ascites is likely reactive. 3. Small bilateral pleural effusions with dependent atelectasis. 4. Aortic atherosclerosis. These results will be called to the ordering clinician or representative by the Radiologist Assistant, and communication documented in the PACS or zVision Dashboard. Electronically Signed   By: Kristine Garbe M.D.   On: 10/22/2016 17:07    Anti-infectives: Anti-infectives    Start     Dose/Rate Route Frequency Ordered Stop   10/21/16 2100  Ampicillin-Sulbactam (UNASYN) 3 g in sodium chloride 0.9 % 100 mL IVPB     3 g 200 mL/hr over 30 Minutes Intravenous Every 12 hours 10/21/16 1548     10/19/16 0915  ertapenem (INVANZ) 1 g in sodium chloride 0.9 % 50 mL IVPB  Status:  Discontinued     1 g 100 mL/hr over 30 Minutes Intravenous Every 24 hours 10/19/16 0909 10/19/16 0910   10/17/16 0400  piperacillin-tazobactam (ZOSYN) IVPB 3.375 g  Status:  Discontinued     3.375 g 12.5 mL/hr over 240 Minutes Intravenous Every 8 hours 10/16/16 2122 10/21/16 1519   10/16/16 2200  piperacillin-tazobactam (ZOSYN) IVPB 3.375 g     3.375 g 100 mL/hr over 30 Minutes Intravenous  Once 10/16/16 2122 10/16/16 2226       Assessment/Plan Acute cholecystitis, DVT S/P percutaneous choelcystostomy tube placement by IR 11/28 - CT 12/1 showed cholecystostomy tube in GB fundus, GB enlarged with large luminal hematoma - Cx: few E.Coli no anaerobes -- started on Unasyn - WBC slightly down today 18 from 19.9 - Will need surgical follow up with Dr. Rolm Bookbinder in 4 weeks as well as follow up per IR at the drain clinic for perc cholecystomy.   Fever - TMAX 99.1 over night  ID - unasyn 11/30>> FEN - carb modified VTE - heparin  Plan: Appreciate ID input. CT yesterday showed cholecystostomy tube in GB fundus, GB enlarged with large  luminal hematoma; ?could hematoma be source of fever, will discuss with MD. TMAX 99.1 over night, WBC slightly down today at 18. Continue IV abx per ID and encourage IS/mobilization with therapies. Continue miralax/colace for constipation. Urine culture pending   LOS: 10 days    Jerrye Beavers , [facility] Surgery 10/23/2016, 8:54 AM Pager: [phone] Consults: [phone] Mon-Fri 7:00 am-4:30 pm Sat-Sun 7:00 am-11:30 am >>

## 2016-10-23 NOTE — Progress Notes (Addendum)
ANTICOAGULATION CONSULT NOTE - Follow Up Consult  Pharmacy Consult for Heparin Indication: DVT  No Known Allergies  Patient Measurements: Height: 5\' 6"  (167.6 cm) Weight: 192 lb 10.9 oz (87.4 kg) IBW/kg (Calculated) : 63.8 Heparin Dosing Weight: 79 kg  Vital Signs: Temp: 99.1 F (37.3 C) (12/02 0920) Temp Source: Oral (12/02 0920) BP: 142/76 (12/02 0920) Pulse Rate: 102 (12/02 0920)  Labs:  Recent Labs  10/21/16 0603 10/21/16 1828 10/22/16 0426 10/23/16 0436  HGB 8.3*  --  7.9* 7.2*  HCT 26.1*  --  24.7* 22.5*  PLT 458*  --  462* 452*  APTT 60* 69* 71* 43*  HEPARINUNFRC 1.34*  --  1.12* 0.69  CREATININE 2.37*  --  2.35* 1.81*    Estimated Creatinine Clearance: 35.4 mL/min (by C-G formula based on SCr of 1.81 mg/dL (H)).  Assessment: 75 yo M with new LLE DVT (diagnosed 11/23).  The patient was started on Heparin (11/21-11/24) then transitioned to apixaban (11/24-11/26) and back to heparin (11/26 - current) due to possible need for surgery. The patient was determined to be too unstable for surgery at this time so a perc cholecystostomy was completed 11/28 in IR.  Heparin restarted post IR.   The patient's heparin level and aPTT are noted to still not be correlated. The aPTT is SUBtherapeutic this AM (aPTT 43 << 71, goal of 66-102) while the heparin level is still falsely elevated at 0.69. It is noted that a new heparin bag was hung at 0345 and the labs were drawn at 0430 therefore it is likely the level is low because of the bag running out and the bag being off of the drip for a period of time. Will hold off on a rate adjustment at this time due to this since the drip has been therapeutic at this dose for 2 days now and the patient is noted to have a new hematoma (see below). Will plan to recheck the level this afternoon.   The Hgb has still been declining slowly, down to 7.2 << 7.9, plts 452. A CT on 12/1 showed an enlarged gallbladder with a large luminal hematoma. Also  noted to have bloody drainage from cholecystectomy tube. MD and surgery team aware. Will aim for the lower end of the therapeutic goal due to this.   Also - noted switch from Unasyn to Cipro + Flagyl to rule out a drug-induced cause of fevers. Will adjust Cipro dose for the patient's renal function.   Goal of Therapy:  Heparin level 0.3-0.7 units/ml aPTT 66-102 seconds Monitor platelets by anticoagulation protocol: Yes   Plan:  1. Continue heparin at 1700 units/hr (17 ml/hr) 2. Will continue to monitor for any signs/symptoms of bleeding and will follow up with aPTT level this afternoon 3. Will follow-up on transition to po anticoagulation when appropriate 4. Adjust Cipro to 500 mg po every 12 hours, continue Flagyl per MD as ordered  Thank you for allowing pharmacy to be a part of this patient's care.  Alycia Rossetti, PharmD, BCPS Clinical Pharmacist Pager: [phone] Clinical phone for 10/23/2016 from 7a-3:30p: [phone] If after 3:30p, please call main pharmacy at: [phone] 10/23/2016 10:30 AM    -------------------------------------------------------------------------------------------------------------- Addendum  The patient's aPTT level this afternoon resulted as therapeutic (aPTT 75, goal of 66-102) without a rate adjustments this morning. Will continue at the current rate and follow-up with a level in the AM.  Plan 1. Continue Heparin at 1700 units/hr (17 ml/hr) 2. Will continue to monitor for any signs/symptoms  of bleeding and will follow up with heparin level and aPTT in the a.m.   Thank you for allowing pharmacy to be a part of this patient's care.  Alycia Rossetti, PharmD, BCPS Clinical Pharmacist Pager: [phone] 10/23/2016 3:10 PM

## 2016-10-24 DIAGNOSIS — K819 Cholecystitis, unspecified: Secondary | ICD-10-CM

## 2016-10-24 LAB — GLUCOSE, CAPILLARY
GLUCOSE-CAPILLARY: 135 mg/dL — AB (ref 65–99)
GLUCOSE-CAPILLARY: 192 mg/dL — AB (ref 65–99)
GLUCOSE-CAPILLARY: 193 mg/dL — AB (ref 65–99)
Glucose-Capillary: 185 mg/dL — ABNORMAL HIGH (ref 65–99)

## 2016-10-24 LAB — AEROBIC/ANAEROBIC CULTURE W GRAM STAIN (SURGICAL/DEEP WOUND): Special Requests: NORMAL

## 2016-10-24 LAB — BASIC METABOLIC PANEL
ANION GAP: 11 (ref 5–15)
BUN: 27 mg/dL — ABNORMAL HIGH (ref 6–20)
CALCIUM: 7.7 mg/dL — AB (ref 8.9–10.3)
CHLORIDE: 95 mmol/L — AB (ref 101–111)
CO2: 27 mmol/L (ref 22–32)
Creatinine, Ser: 1.49 mg/dL — ABNORMAL HIGH (ref 0.61–1.24)
GFR calc non Af Amer: 44 mL/min — ABNORMAL LOW (ref >60)
GFR, EST AFRICAN AMERICAN: 50 mL/min — AB (ref >60)
Glucose, Bld: 121 mg/dL — ABNORMAL HIGH (ref 65–99)
POTASSIUM: 3.8 mmol/L (ref 3.5–5.1)
Sodium: 133 mmol/L — ABNORMAL LOW (ref 135–145)

## 2016-10-24 LAB — CBC
HCT: 22.5 % — ABNORMAL LOW (ref 39.0–52.0)
HEMOGLOBIN: 7.2 g/dL — AB (ref 13.0–17.0)
MCH: 28.2 pg (ref 26.0–34.0)
MCHC: 32 g/dL (ref 30.0–36.0)
MCV: 88.2 fL (ref 78.0–100.0)
Platelets: 462 10*3/uL — ABNORMAL HIGH (ref 150–400)
RBC: 2.55 MIL/uL — AB (ref 4.22–5.81)
RDW: 14.6 % (ref 11.5–15.5)
WBC: 17.2 10*3/uL — ABNORMAL HIGH (ref 4.0–10.5)

## 2016-10-24 LAB — URINE CULTURE: Culture: NO GROWTH

## 2016-10-24 LAB — APTT
aPTT: 66 seconds — ABNORMAL HIGH (ref 24–36)
aPTT: 55 seconds — ABNORMAL HIGH (ref 24–36)

## 2016-10-24 LAB — HEPARIN LEVEL (UNFRACTIONATED): Heparin Unfractionated: 0.72 IU/mL — ABNORMAL HIGH (ref 0.30–0.70)

## 2016-10-24 MED ORDER — INSULIN DETEMIR 100 UNIT/ML ~~LOC~~ SOLN
10.0000 [IU] | Freq: Two times a day (BID) | SUBCUTANEOUS | Status: DC
  Administered 2016-10-24 – 2016-10-26 (×4): 10 [IU] via SUBCUTANEOUS
  Filled 2016-10-24 (×5): qty 0.1

## 2016-10-24 NOTE — Progress Notes (Signed)
Referring Physician(s): Dr. Rolm Bookbinder  Supervising Physician: Daryll Brod  Patient Status:  [facility] - In-pt  Chief Complaint:  Acute cholecystitis  Subjective:  Stephen Rogers says he is feeling much better today. He is eating better today as well. Family at bedside.  Allergies: Patient has no known allergies.  Medications: Prior to Admission medications   Medication Sig Start Date End Date Taking? Authorizing Provider  aspirin EC 81 MG tablet Take 81 mg by mouth every morning.    Yes Historical Provider, MD  cloNIDine (CATAPRES) 0.2 MG tablet Take 0.2 mg by mouth daily.    Yes Historical Provider, MD  furosemide (LASIX) 20 MG tablet Take 10 mg by mouth daily as needed for edema or fluid. 08/14/16  Yes Historical Provider, MD  glipiZIDE (GLUCOTROL) 10 MG tablet Take 10 mg by mouth daily before breakfast.    Yes Historical Provider, MD  hydrALAZINE (APRESOLINE) 25 MG tablet Take 25 mg by mouth every evening.    Yes Historical Provider, MD  lisinopril (PRINIVIL,ZESTRIL) 40 MG tablet Take 20 mg by mouth every evening.    Yes Historical Provider, MD  metFORMIN (GLUCOPHAGE) 1000 MG tablet Take 1,000 mg by mouth every evening.    Yes Historical Provider, MD  simvastatin (ZOCOR) 40 MG tablet Take 40 mg by mouth every evening.    Yes Historical Provider, MD  verapamil (CALAN-SR) 240 MG CR tablet Take 240 mg by mouth at bedtime.   Yes Historical Provider, MD     Vital Signs: BP 134/67 (BP Location: Left Arm)   Pulse (!) 101   Temp 98.7 F (37.1 C) (Oral)   Resp 18   Ht 5\' 6"  (1.676 m)   Wt 206 lb 4.8 oz (93.6 kg)   SpO2 94%   BMI 33.30 kg/m   Physical Exam Awake and alert Abdomen soft, NTND Drain in place Bloody bilious output but appears to be clearing.  Imaging: Ct Abdomen Pelvis Wo Contrast  Result Date: 10/22/2016 CLINICAL DATA:  75 y/o M; right-sided abdominal pain after percutaneous cholecystostomy. EXAM: CT ABDOMEN AND PELVIS WITHOUT CONTRAST TECHNIQUE:  Multidetector CT imaging of the abdomen and pelvis was performed following the standard protocol without IV contrast. COMPARISON:  10/17/2016 abdominal ultrasound. 10/16/2016 abdominal MRI. 10/13/2016 CT abdomen. FINDINGS: Lower chest: Small right greater than left pleural effusions with dependent opacities likely representing atelectasis. Hepatobiliary: No focal liver abnormality. There is a cholecystostomy tube with pigtail in the upper gallbladder fundus. The gallbladder is markedly enlarged small foci of air in a large high attenuation collection likely representing a hematoma. Pancreas: Unremarkable. No pancreatic ductal dilatation or surrounding inflammatory changes. Spleen: Normal in size without focal abnormality. Adrenals/Urinary Tract: Adrenal glands are unremarkable. Stable left kidney interpolar and right kidney upper pole cysts. Kidneys are otherwise normal, without renal calculi, focal lesion, or hydronephrosis. Bladder is unremarkable. Stomach/Bowel: Stomach is within normal limits. Appendix appears normal. No evidence of bowel wall thickening, distention, or inflammatory changes. Vascular/Lymphatic: Aortic atherosclerosis. No enlarged abdominal or pelvic lymph nodes. Reproductive: Prostate is unremarkable. Other: Small volume of right hemi abdomen ascites. Musculoskeletal: Multilevel degenerative changes of the thoracic and lumbar spine and mild lumbar levocurvature. Mild bilateral hip osteoarthrosis. No acute osseous abnormality is evident. IMPRESSION: 1. Cholecystostomy tube within the gallbladder fundus. Gallbladder is markedly enlarged with large luminal hematoma. 2. Small volume of right hemi abdomen ascites is likely reactive. 3. Small bilateral pleural effusions with dependent atelectasis. 4. Aortic atherosclerosis. These results will be called to the ordering  clinician or representative by the Radiologist Assistant, and communication documented in the PACS or zVision Dashboard. Electronically  Signed   By: Kristine Garbe M.D.   On: 10/22/2016 17:07   Dg Chest Port 1 View  Result Date: 10/21/2016 CLINICAL DATA:  Fever.  Recent gallbladder surgery . EXAM: PORTABLE CHEST 1 VIEW COMPARISON:  10/16/2016 . FINDINGS: Heart size stable. Low lung volumes with basilar atelectasis again noted. No pleural effusion or pneumothorax. IMPRESSION: Low lung volumes with basilar atelectasis again noted. Electronically Signed   By: Marcello Moores  Register   On: 10/21/2016 07:34    Labs:  CBC:  Recent Labs  10/21/16 0603 10/22/16 0426 10/23/16 0436 10/24/16 0518  WBC 17.4* 19.9* 18.0* 17.2*  HGB 8.3* 7.9* 7.2* 7.2*  HCT 26.1* 24.7* 22.5* 22.5*  PLT 458* 462* 452* 462*    COAGS:  Recent Labs  10/13/16 1414  10/22/16 0426 10/23/16 0436 10/23/16 1353 10/24/16 0518  INR 1.16  --   --   --   --   --   APTT 27  < > 71* 43* 75* 66*  < > = values in this interval not displayed.  BMP:  Recent Labs  10/21/16 0603 10/22/16 0426 10/23/16 0436 10/24/16 0518  NA 135 135 135 133*  K 4.1 3.8 3.8 3.8  CL 97* 96* 95* 95*  CO2 27 27 27 27   GLUCOSE 174* 230* 207* 121*  BUN 36* 41* 36* 27*  CALCIUM 7.9* 7.9* 7.8* 7.7*  CREATININE 2.37* 2.35* 1.81* 1.49*  GFRNONAA 25* 25* 34* 44*  GFRAA 29* 29* 40* 50*    LIVER FUNCTION TESTS:  Recent Labs  10/17/16 0917 10/18/16 0544 10/20/16 0548 10/21/16 0603  BILITOT 2.7* 2.1* 1.3* 1.2  AST 260* 117* 69* 62*  ALT 224* 161* 83* 72*  ALKPHOS 581* 466* 305* 257*  PROT 6.3* 6.3* 6.2* 6.6  ALBUMIN 1.8* 1.9* 1.6* 1.6*    Assessment and Plan:  S/P Cholecystostomy by Dr. Kathlene Cote on 11/28 Gallbladder hematoma on imaging Hgb 7.2 (stable from yesterday) Bili 1.2 and WBC 17.2 Creatinine down to 1.49 Continue current care Will continue to follow  Electronically Signed: Murrell Redden PA-C 10/24/2016, 10:34 AM   I spent a total of 15 Minutes at the the patient's bedside AND on the patient's hospital floor or unit, greater than 50% of  which was counseling/coordinating care for f/u chole tube.

## 2016-10-24 NOTE — Progress Notes (Signed)
Patient ID: Stephen Bales Sr., male   DOB: December 22, 1940, 75 y.o.   MRN: [id number]  [facility] Surgery Progress Note     Subjective: Feels well today. States that abdomen is tender around drain, but otherwise no complaints. Denies n/v. Tolerating diet. BM yesterday. WBC trending down 17.2, TMAX 99.8  Objective: Vital signs in last 24 hours: Temp:  [97.6 F (36.4 C)-99.8 F (37.7 C)] 98.7 F (37.1 C) (12/03 0900) Pulse Rate:  [94-101] 101 (12/03 0900) Resp:  [18] 18 (12/03 0900) BP: (125-135)/(67-78) 134/67 (12/03 0900) SpO2:  [94 %-98 %] 94 % (12/03 0900) Weight:  [206 lb 4.8 oz (93.6 kg)] 206 lb 4.8 oz (93.6 kg) (12/02 2105) Last BM Date: 10/23/16  Intake/Output from previous day: 12/02 0701 - 12/03 0700 In: 2569.4 [P.O.:732; I.V.:1837.4] Out: 1703 [Urine:1700; Drains:3] Intake/Output this shift: Total I/O In: 240 [P.O.:240] Out: -   PE: Gen:  Alert, NAD, pleasant Card:  RRR, no M/G/R heard Pulm:  CTAB, no W/R/R Abd: Soft, mild distension, +BS, TTP RUQ/around drain, cholecystostomy drain with minimal serosanguinous output  Lab Results:   Recent Labs  10/23/16 0436 10/24/16 0518  WBC 18.0* 17.2*  HGB 7.2* 7.2*  HCT 22.5* 22.5*  PLT 452* 462*   BMET  Recent Labs  10/23/16 0436 10/24/16 0518  NA 135 133*  K 3.8 3.8  CL 95* 95*  CO2 27 27  GLUCOSE 207* 121*  BUN 36* 27*  CREATININE 1.81* 1.49*  CALCIUM 7.8* 7.7*   PT/INR No results for input(s): LABPROT, INR in the last 72 hours. CMP     Component Value Date/Time   NA 133 (L) 10/24/2016 0518   K 3.8 10/24/2016 0518   CL 95 (L) 10/24/2016 0518   CO2 27 10/24/2016 0518   GLUCOSE 121 (H) 10/24/2016 0518   BUN 27 (H) 10/24/2016 0518   CREATININE 1.49 (H) 10/24/2016 0518   CALCIUM 7.7 (L) 10/24/2016 0518   PROT 6.6 10/21/2016 0603   ALBUMIN 1.6 (L) 10/21/2016 0603   AST 62 (H) 10/21/2016 0603   ALT 72 (H) 10/21/2016 0603   ALKPHOS 257 (H) 10/21/2016 0603   BILITOT 1.2 10/21/2016 0603    GFRNONAA 44 (L) 10/24/2016 0518   GFRAA 50 (L) 10/24/2016 0518   Lipase     Component Value Date/Time   LIPASE 36 10/07/2016 2153       Studies/Results: Ct Abdomen Pelvis Wo Contrast  Result Date: 10/22/2016 CLINICAL DATA:  75 y/o M; right-sided abdominal pain after percutaneous cholecystostomy. EXAM: CT ABDOMEN AND PELVIS WITHOUT CONTRAST TECHNIQUE: Multidetector CT imaging of the abdomen and pelvis was performed following the standard protocol without IV contrast. COMPARISON:  10/17/2016 abdominal ultrasound. 10/16/2016 abdominal MRI. 10/13/2016 CT abdomen. FINDINGS: Lower chest: Small right greater than left pleural effusions with dependent opacities likely representing atelectasis. Hepatobiliary: No focal liver abnormality. There is a cholecystostomy tube with pigtail in the upper gallbladder fundus. The gallbladder is markedly enlarged small foci of air in a large high attenuation collection likely representing a hematoma. Pancreas: Unremarkable. No pancreatic ductal dilatation or surrounding inflammatory changes. Spleen: Normal in size without focal abnormality. Adrenals/Urinary Tract: Adrenal glands are unremarkable. Stable left kidney interpolar and right kidney upper pole cysts. Kidneys are otherwise normal, without renal calculi, focal lesion, or hydronephrosis. Bladder is unremarkable. Stomach/Bowel: Stomach is within normal limits. Appendix appears normal. No evidence of bowel wall thickening, distention, or inflammatory changes. Vascular/Lymphatic: Aortic atherosclerosis. No enlarged abdominal or pelvic lymph nodes. Reproductive: Prostate is unremarkable. Other:  Small volume of right hemi abdomen ascites. Musculoskeletal: Multilevel degenerative changes of the thoracic and lumbar spine and mild lumbar levocurvature. Mild bilateral hip osteoarthrosis. No acute osseous abnormality is evident. IMPRESSION: 1. Cholecystostomy tube within the gallbladder fundus. Gallbladder is markedly enlarged  with large luminal hematoma. 2. Small volume of right hemi abdomen ascites is likely reactive. 3. Small bilateral pleural effusions with dependent atelectasis. 4. Aortic atherosclerosis. These results will be called to the ordering clinician or representative by the Radiologist Assistant, and communication documented in the PACS or zVision Dashboard. Electronically Signed   By: Kristine Garbe M.D.   On: 10/22/2016 17:07    Anti-infectives: Anti-infectives    Start     Dose/Rate Route Frequency Ordered Stop   10/23/16 1400  metroNIDAZOLE (FLAGYL) tablet 500 mg     500 mg Oral Every 8 hours 10/23/16 1026     10/23/16 1254  ciprofloxacin (CIPRO) 500 MG tablet    Comments:  [name]   : cabinet override      10/23/16 1254 10/23/16 1300   10/23/16 1100  ciprofloxacin (CIPRO) tablet 500 mg     500 mg Oral 2 times daily 10/23/16 1047     10/23/16 1030  ciprofloxacin (CIPRO) tablet 500 mg  Status:  Discontinued     500 mg Oral Daily 10/23/16 1026 10/23/16 1047   10/21/16 2100  Ampicillin-Sulbactam (UNASYN) 3 g in sodium chloride 0.9 % 100 mL IVPB  Status:  Discontinued     3 g 200 mL/hr over 30 Minutes Intravenous Every 12 hours 10/21/16 1548 10/23/16 1026   10/19/16 0915  ertapenem (INVANZ) 1 g in sodium chloride 0.9 % 50 mL IVPB  Status:  Discontinued     1 g 100 mL/hr over 30 Minutes Intravenous Every 24 hours 10/19/16 0909 10/19/16 0910   10/17/16 0400  piperacillin-tazobactam (ZOSYN) IVPB 3.375 g  Status:  Discontinued     3.375 g 12.5 mL/hr over 240 Minutes Intravenous Every 8 hours 10/16/16 2122 10/21/16 1519   10/16/16 2200  piperacillin-tazobactam (ZOSYN) IVPB 3.375 g     3.375 g 100 mL/hr over 30 Minutes Intravenous  Once 10/16/16 2122 10/16/16 2226       Assessment/Plan Acute cholecystitis, DVT S/P percutaneous choelcystostomy tube placement by IR 11/28 - CT 12/1 showed cholecystostomy tube in GB fundus, GB enlarged with large luminal hematoma - Cx: few E.Coli no  anaerobes - per RN someone flushed tube this AM - WBC slightly down today 18 from 19.9 - Will need surgical follow up with Dr. Rolm Bookbinder in 4 weeks as well as follow up per IR at the drain clinic for perc cholecystomy.  Fever  - TMAX 99.8 over night - abx switched to cipro/flagyl to rule out drug fever  ID - unasyn 11/30>>12/2, cipro 12/2>>, Flagyl 12/2>> >>> per ID consider 3 weeks of antibiotics if he continues to do well FEN - carb modified VTE - heparin  Plan: Chole tube flushed this morning, trace output yesterday. Continue to monitor, may need drain study. Appreciate ID input. TMAX 99.8 over night, WBC continues to slowly trend down 17.2.  Continue abx per ID (switched to cipro/flagyl yesterday). Encourage IS/mobilization. Urine culture pending   LOS: 11 days    Jerrye Beavers , [place] [facility] Surgery 10/24/2016, 9:56 AM Pager: [phone] Consults: [phone] Mon-Fri 7:00 am-4:30 pm Sat-Sun 7:00 am-11:30 am

## 2016-10-24 NOTE — Progress Notes (Signed)
ANTICOAGULATION CONSULT NOTE - Follow Up Consult  Pharmacy Consult for Heparin Indication: DVT  No Known Allergies  Patient Measurements: Height: 5\' 6"  (167.6 cm) Weight: 206 lb 4.8 oz (93.6 kg) IBW/kg (Calculated) : 63.8 Heparin Dosing Weight: 79 kg  Vital Signs: Temp: 98.7 F (37.1 C) (12/03 0900) Temp Source: Oral (12/03 0900) BP: 134/67 (12/03 0900) Pulse Rate: 101 (12/03 0900)  Labs:  Recent Labs  10/22/16 0426 10/23/16 0436 10/23/16 1353 10/24/16 0518  HGB 7.9* 7.2*  --  7.2*  HCT 24.7* 22.5*  --  22.5*  PLT 462* 452*  --  462*  APTT 71* 43* 75* 66*  HEPARINUNFRC 1.12* 0.69  --  0.72*  CREATININE 2.35* 1.81*  --  1.49*    Estimated Creatinine Clearance: 44.5 mL/min (by C-G formula based on SCr of 1.49 mg/dL (H)).  Assessment: 75 yo M with new LLE DVT (diagnosed 11/23).  The patient was started on Heparin (11/21-11/24) then transitioned to apixaban (11/24-11/26) and back to heparin (11/26 - current) due to possible need for surgery. The patient was determined to be too unstable for surgery at this time so a perc cholecystostomy was completed 11/28 in IR.  Heparin restarted post IR.   The patient's heparin level and aPTT are noted to still not be correlated. The aPTT is therapeutic this AM (aPTT 66, goal of 66-102) while the heparin level is still falsely elevated at 0.72.  The Hgb has still been declining slowly, down to 7.2 << 7.9, plts 452. A CT on 12/1 showed an enlarged gallbladder with a large luminal hematoma. Also noted to have bloody drainage from cholecystectomy tube. MD and surgery team aware. Will aim for the lower end of the therapeutic goal due to this.  RN reports no infusion related issues and blood loss in tube has remained minimal.  Goal of Therapy:  Heparin level 0.3-0.7 units/ml aPTT 66-102 seconds Monitor platelets by anticoagulation protocol: Yes   Plan:  1. Continue heparin at 1700 units/hr (17 ml/hr) 2. Will continue to monitor for any  signs/symptoms of bleeding  3. Will follow-up on transition to po anticoagulation when appropriate  Thank you for allowing pharmacy to be a part of this patient's care.  Georga Bora, PharmD Clinical Pharmacist Pager: [phone] 10/24/2016 11:09 AM

## 2016-10-24 NOTE — Progress Notes (Signed)
PROGRESS NOTE [id number]   Stephen WANAMAKER Sr.   [id number] DOB: 1941-05-23  DOA: 10/12/2016 PCP: [name], MD   Brief Narrative:  75 year old African-American male with a past medical history of hypertension, diabetes, presented to the hospital with complaints of leg swelling and inability to walk. Patient was recently hospitalized to [facility] from November 16 November 20 for acute renal failure. He was given IV fluids. He went home and noted that his legs were swollen. His Lasix was not resumed. He found it difficult to ambulate so he presented to the hospital here. He was hospitalized for further management. Patient diuresed well. He was also found to have acute DVT for which he was placed on anticoagulation. His LFTs were improving, but then he started spiking fever. He was seen by gastroenterology. Subsequently, general surgery was consulted as well for suspected cholecystitis. Patient is s/p perc cholecystostomy, draining bloody bile. Patient started having fever, blood cultures were repeated, no source of infection was found. CT abdomen was repeated showed gallbladder hematoma explaining drop in Hb. ID was consulted Abx were switched. Patient subsequently improved. Patient has remains afebrile for the past 48 hrs.  Subjective: Patient more interactive today. Patient report is the best he has feel since arriving to the hospital. Little drainage in the cholecystostomy bag. Tolerating diet well. Had a BM yesterday. Cr improving   Assessment & Plan: Fever with increase in WBC - unknown source at this time - ? From gallbladder hematoma vs drug fever antibiotics where switch and patient has remain afebrile  repeated blood cultures from 11/29 so far negative, CXR no PNA ID input appreciated  Continue cipro/flagyl - will complete 3 week of from the day of negative blood cultures  Encouraged incentive spirometry use Urine culture negative  If spike fever again  re-culture    Escherichia coli bacteremia and sepsis - pansensitive, sepsis physiology has resolved Pansensitive Escherichia coli, can be discharged on oral amoxicillin or Cipro for total of 3 weeks from the day of second blood cultures, 10/16/2016 - 11/06/2016 See above   Acute cholecystitis, status post percutaneous cholecystostomy with tubes in place.  Initially with Fever, transaminitis and jaundice,  Abdominal ultrasound showed multiple gallstones. HIDA scan showing cystic duct obstruction consistent with acute cholecystitis. IR recommendations appreciated, can be d/c with drains - need to follow up with IR clinic  Gallbladder cx show E coli pansensitive  Continue abx   Chronic kidney disease stage 3 - Acute kidney injury - 2/2 dehydration in combination with ACE and Lasix  Continues to improve with IVF  Continue to hold Lasix and ACEi Repeat BMP in AM   Normocytic anemia - has been dropping likely 2/2 to gallbladder hematoma and hemodilution. Remains the same  Will continue to monitor If Hb <7 or develop symptoms with current level will transfuse  Will add iron supplement   Transaminitis  Likely secondary to acute cholecystitis Repeat CMP in am   Acute Distal left lower extremity DVT Likely due to recent hospitalization and sedentary status. Continue IV heparin for now  Creatinine improving can resume Eliquis in am if Cr stable   Elevated troponin/abnormal EKG Patient with nonspecific changes on EKG. No acute ischemic changes noted. Patient denies any chest pains. Echocardiogram does not show any wall motion abnormalities. G1DD. No further work up at this time  Essential hypertension- stable  ACEi are being held due to AKI BP has remained stable, this medication will not be resume on discharge   Continue  hydralazine and verapamil  Monitor BP   Diabetes mellitus type 2 - CBGs better with Levemir  Continue SSI  Monitor CBG's Increase Levemir to 10 units BID    Hyperlipidemia Will resume statin upon discharge    DVT prophylaxis: Heparin  Code Status: Full  Family Communication: None at bedside  Disposition Plan: Anticipate discharge to SNF tomorrow   Consultants:   Surgery   IR   Procedures:   Percutaneous Cholecystostomy   Antimicrobials:   Zosyn 11/25   Objective: Vitals:   10/23/16 1700 10/23/16 2105 10/24/16 0500 10/24/16 0900  BP: 135/78 128/78 125/70 134/67  Pulse: 94 98 97 (!) 101  Resp: 18 18 18 18   Temp: 97.6 F (36.4 C) 99.8 F (37.7 C) 99.8 F (37.7 C) 98.7 F (37.1 C)  TempSrc: Oral Oral Oral Oral  SpO2: 98% 98% 95% 94%  Weight:  93.6 kg (206 lb 4.8 oz)    Height:  5\' 6"  (1.676 m)      Intake/Output Summary (Last 24 hours) at 10/24/16 1440 Last data filed at 10/24/16 0900  Gross per 24 hour  Intake             1827 ml  Output             1353 ml  Net              474 ml   Filed Weights   10/21/16 2151 10/22/16 2123 10/23/16 2105  Weight: 90.4 kg (199 lb 4.7 oz) 87.4 kg (192 lb 10.9 oz) 93.6 kg (206 lb 4.8 oz)    Examination:  General exam: Happy and relax. Respiratory system:  Good air entry  Cardiovascular system: S1 & S2 heard, RRR.  Gastrointestinal system: Obese,   Extremities: 1+ pedal edema Skin: No lesion or ulcers   Data Reviewed: I have personally reviewed following labs and imaging studies  CBC:  Recent Labs Lab 10/18/16 0544  10/20/16 0548 10/21/16 0603 10/22/16 0426 10/23/16 0436 10/24/16 0518  WBC 21.8*  < > 18.0* 17.4* 19.9* 18.0* 17.2*  NEUTROABS 18.8*  --   --   --   --  14.1*  --   HGB 10.2*  < > 8.8* 8.3* 7.9* 7.2* 7.2*  HCT 30.9*  < > 27.3* 26.1* 24.7* 22.5* 22.5*  MCV 88.3  < > 88.9 89.1 88.8 88.2 88.2  PLT 370  < > 393 458* 462* 452* 462*  < > = values in this interval not displayed. Basic Metabolic Panel:  Recent Labs Lab 10/20/16 0548 10/21/16 0603 10/22/16 0426 10/23/16 0436 10/24/16 0518  NA 133* 135 135 135 133*  K 3.7 4.1 3.8 3.8 3.8  CL  94* 97* 96* 95* 95*  CO2 26 27 27 27 27   GLUCOSE 216* 174* 230* 207* 121*  BUN 33* 36* 41* 36* 27*  CREATININE 2.03* 2.37* 2.35* 1.81* 1.49*  CALCIUM 7.9* 7.9* 7.9* 7.8* 7.7*   GFR: Estimated Creatinine Clearance: 44.5 mL/min (by C-G formula based on SCr of 1.49 mg/dL (H)). Liver Function Tests:  Recent Labs Lab 10/18/16 0544 10/20/16 0548 10/21/16 0603  AST 117* 69* 62*  ALT 161* 83* 72*  ALKPHOS 466* 305* 257*  BILITOT 2.1* 1.3* 1.2  PROT 6.3* 6.2* 6.6  ALBUMIN 1.9* 1.6* 1.6*   No results for input(s): LIPASE, AMYLASE in the last 168 hours. No results for input(s): AMMONIA in the last 168 hours. Coagulation Profile: No results for input(s): INR, PROTIME in the last 168 hours. Cardiac Enzymes: No  results for input(s): CKTOTAL, CKMB, CKMBINDEX, TROPONINI in the last 168 hours. BNP (last 3 results) No results for input(s): PROBNP in the last 8760 hours. HbA1C: No results for input(s): HGBA1C in the last 72 hours. CBG:  Recent Labs Lab 10/23/16 1157 10/23/16 1708 10/23/16 2210 10/24/16 0740 10/24/16 1206  GLUCAP 195* 191* 172* 135* 185*   Lipid Profile: No results for input(s): CHOL, HDL, LDLCALC, TRIG, CHOLHDL, LDLDIRECT in the last 72 hours. Thyroid Function Tests: No results for input(s): TSH, T4TOTAL, FREET4, T3FREE, THYROIDAB in the last 72 hours. Anemia Panel: No results for input(s): VITAMINB12, FOLATE, FERRITIN, TIBC, IRON, RETICCTPCT in the last 72 hours. Sepsis Labs: No results for input(s): PROCALCITON, LATICACIDVEN in the last 168 hours.  Recent Results (from the past 240 hour(s))  Culture, blood (routine x 2)     Status: None   Collection Time: 10/16/16 10:16 PM  Result Value Ref Range Status   Specimen Description BLOOD LEFT HAND  Final   Special Requests BOTTLES DRAWN AEROBIC AND ANAEROBIC 5ML  Final   Culture NO GROWTH 5 DAYS  Final   Report Status 10/22/2016 FINAL  Final  Culture, blood (routine x 2)     Status: Abnormal   Collection Time:  10/16/16 10:25 PM  Result Value Ref Range Status   Specimen Description BLOOD RIGHT HAND  Final   Special Requests IN PEDIATRIC BOTTLE 3ML  Final   Culture  Setup Time   Final    GRAM NEGATIVE RODS IN PEDIATRIC BOTTLE CRITICAL RESULT CALLED TO, READ BACK BY AND VERIFIED WITH: C BALL,PHARMD AT 1405 10/17/16 BY L BENFIELD    Culture ESCHERICHIA COLI (A)  Final   Report Status 10/19/2016 FINAL  Final   Organism ID, Bacteria ESCHERICHIA COLI  Final      Susceptibility   Escherichia coli - MIC*    AMPICILLIN <=2 SENSITIVE Sensitive     CEFAZOLIN <=4 SENSITIVE Sensitive     CEFEPIME <=1 SENSITIVE Sensitive     CEFTAZIDIME <=1 SENSITIVE Sensitive     CEFTRIAXONE <=1 SENSITIVE Sensitive     CIPROFLOXACIN <=0.25 SENSITIVE Sensitive     GENTAMICIN <=1 SENSITIVE Sensitive     IMIPENEM <=0.25 SENSITIVE Sensitive     TRIMETH/SULFA <=20 SENSITIVE Sensitive     AMPICILLIN/SULBACTAM <=2 SENSITIVE Sensitive     PIP/TAZO <=4 SENSITIVE Sensitive     Extended ESBL NEGATIVE Sensitive     * ESCHERICHIA COLI  Blood Culture ID Panel (Reflexed)     Status: Abnormal   Collection Time: 10/16/16 10:25 PM  Result Value Ref Range Status   Enterococcus species NOT DETECTED NOT DETECTED Final   Listeria monocytogenes NOT DETECTED NOT DETECTED Final   Staphylococcus species NOT DETECTED NOT DETECTED Final   Staphylococcus aureus NOT DETECTED NOT DETECTED Final   Streptococcus species NOT DETECTED NOT DETECTED Final   Streptococcus agalactiae NOT DETECTED NOT DETECTED Final   Streptococcus pneumoniae NOT DETECTED NOT DETECTED Final   Streptococcus pyogenes NOT DETECTED NOT DETECTED Final   Acinetobacter baumannii NOT DETECTED NOT DETECTED Final   Enterobacteriaceae species DETECTED (A) NOT DETECTED Final    Comment: CRITICAL RESULT CALLED TO, READ BACK BY AND VERIFIED WITH: C BALL,PHARMD AT 1405 10/17/16 BY L BENFIELD    Enterobacter cloacae complex NOT DETECTED NOT DETECTED Final   Escherichia coli  DETECTED (A) NOT DETECTED Final    Comment: CRITICAL RESULT CALLED TO, READ BACK BY AND VERIFIED WITH: C BALL,PHARMD AT 1405 10/17/16 BY L BENFIELD  Klebsiella oxytoca NOT DETECTED NOT DETECTED Final   Klebsiella pneumoniae NOT DETECTED NOT DETECTED Final   Proteus species NOT DETECTED NOT DETECTED Final   Serratia marcescens NOT DETECTED NOT DETECTED Final   Carbapenem resistance NOT DETECTED NOT DETECTED Final   Haemophilus influenzae NOT DETECTED NOT DETECTED Final   Neisseria meningitidis NOT DETECTED NOT DETECTED Final   Pseudomonas aeruginosa NOT DETECTED NOT DETECTED Final   Candida albicans NOT DETECTED NOT DETECTED Final   Candida glabrata NOT DETECTED NOT DETECTED Final   Candida krusei NOT DETECTED NOT DETECTED Final   Candida parapsilosis NOT DETECTED NOT DETECTED Final   Candida tropicalis NOT DETECTED NOT DETECTED Final  Aerobic/Anaerobic Culture (surgical/deep wound)     Status: None   Collection Time: 10/19/16  4:28 PM  Result Value Ref Range Status   Specimen Description GALL BLADDER  Final   Special Requests Normal  Final   Gram Stain   Final    ABUNDANT WBC PRESENT,BOTH PMN AND MONONUCLEAR NO ORGANISMS SEEN    Culture FEW ESCHERICHIA COLI NO ANAEROBES ISOLATED   Final   Report Status 10/24/2016 FINAL  Final   Organism ID, Bacteria ESCHERICHIA COLI  Final      Susceptibility   Escherichia coli - MIC*    AMPICILLIN <=2 SENSITIVE Sensitive     CEFAZOLIN <=4 SENSITIVE Sensitive     CEFEPIME <=1 SENSITIVE Sensitive     CEFTAZIDIME <=1 SENSITIVE Sensitive     CEFTRIAXONE <=1 SENSITIVE Sensitive     CIPROFLOXACIN <=0.25 SENSITIVE Sensitive     GENTAMICIN <=1 SENSITIVE Sensitive     IMIPENEM <=0.25 SENSITIVE Sensitive     TRIMETH/SULFA <=20 SENSITIVE Sensitive     AMPICILLIN/SULBACTAM <=2 SENSITIVE Sensitive     PIP/TAZO <=4 SENSITIVE Sensitive     Extended ESBL NEGATIVE Sensitive     * FEW ESCHERICHIA COLI  Culture, blood (Routine X 2) w Reflex to ID Panel      Status: None (Preliminary result)   Collection Time: 10/20/16  6:50 PM  Result Value Ref Range Status   Specimen Description BLOOD LEFT HAND  Final   Special Requests BLOOD AEROBIC BOTTLE 10 CC, BANA 5 CC  Final   Culture NO GROWTH 3 DAYS  Final   Report Status PENDING  Incomplete  Culture, blood (Routine X 2) w Reflex to ID Panel     Status: None (Preliminary result)   Collection Time: 10/20/16  6:58 PM  Result Value Ref Range Status   Specimen Description BLOOD RIGHT HAND  Final   Special Requests BLOOD AEROBIC BOTTLE 5 CC  Final   Culture NO GROWTH 3 DAYS  Final   Report Status PENDING  Incomplete  Culture, Urine     Status: None   Collection Time: 10/22/16  6:49 PM  Result Value Ref Range Status   Specimen Description URINE, CATHETERIZED  Final   Special Requests NONE  Final   Culture NO GROWTH  Final   Report Status 10/24/2016 FINAL  Final     Radiology Studies: Ct Abdomen Pelvis Wo Contrast  Result Date: 10/22/2016 CLINICAL DATA:  75 y/o M; right-sided abdominal pain after percutaneous cholecystostomy. EXAM: CT ABDOMEN AND PELVIS WITHOUT CONTRAST TECHNIQUE: Multidetector CT imaging of the abdomen and pelvis was performed following the standard protocol without IV contrast. COMPARISON:  10/17/2016 abdominal ultrasound. 10/16/2016 abdominal MRI. 10/13/2016 CT abdomen. FINDINGS: Lower chest: Small right greater than left pleural effusions with dependent opacities likely representing atelectasis. Hepatobiliary: No focal liver abnormality. There is  a cholecystostomy tube with pigtail in the upper gallbladder fundus. The gallbladder is markedly enlarged small foci of air in a large high attenuation collection likely representing a hematoma. Pancreas: Unremarkable. No pancreatic ductal dilatation or surrounding inflammatory changes. Spleen: Normal in size without focal abnormality. Adrenals/Urinary Tract: Adrenal glands are unremarkable. Stable left kidney interpolar and right kidney  upper pole cysts. Kidneys are otherwise normal, without renal calculi, focal lesion, or hydronephrosis. Bladder is unremarkable. Stomach/Bowel: Stomach is within normal limits. Appendix appears normal. No evidence of bowel wall thickening, distention, or inflammatory changes. Vascular/Lymphatic: Aortic atherosclerosis. No enlarged abdominal or pelvic lymph nodes. Reproductive: Prostate is unremarkable. Other: Small volume of right hemi abdomen ascites. Musculoskeletal: Multilevel degenerative changes of the thoracic and lumbar spine and mild lumbar levocurvature. Mild bilateral hip osteoarthrosis. No acute osseous abnormality is evident. IMPRESSION: 1. Cholecystostomy tube within the gallbladder fundus. Gallbladder is markedly enlarged with large luminal hematoma. 2. Small volume of right hemi abdomen ascites is likely reactive. 3. Small bilateral pleural effusions with dependent atelectasis. 4. Aortic atherosclerosis. These results will be called to the ordering clinician or representative by the Radiologist Assistant, and communication documented in the PACS or zVision Dashboard. Electronically Signed   By: Kristine Garbe M.D.   On: 10/22/2016 17:07    Scheduled Meds: . aspirin EC  81 mg Oral Daily  . ciprofloxacin  500 mg Oral BID  . docusate sodium  100 mg Oral BID  . hydrALAZINE  25 mg Oral QPM  . insulin aspart  0-5 Units Subcutaneous QHS  . insulin aspart  0-9 Units Subcutaneous TID WC  . insulin detemir  8 Units Subcutaneous BID  . metroNIDAZOLE  500 mg Oral Q8H  . polyethylene glycol  17 g Oral Daily  . sodium chloride flush  10-40 mL Intracatheter Q12H  . verapamil  240 mg Oral QHS   Continuous Infusions: . sodium chloride 50 mL/hr at 10/23/16 1342  . heparin 1,700 Units/hr (10/24/16 1107)     LOS: 11 days   Chipper Oman, MD [facility] Pager [phone]  If 7PM-7AM, please contact night-coverage [email or web address] Password [email or web address] 10/24/2016, 2:40 PM

## 2016-10-24 NOTE — Progress Notes (Signed)
ANTICOAGULATION CONSULT NOTE - Follow Up Consult  Pharmacy Consult for Heparin Indication: DVT  No Known Allergies  Patient Measurements: Height: 5\' 6"  (167.6 cm) Weight: 206 lb 4.8 oz (93.6 kg) IBW/kg (Calculated) : 63.8 Heparin Dosing Weight: 79 kg  Vital Signs: Temp: 99.6 F (37.6 C) (12/03 2109) Temp Source: Oral (12/03 2109) BP: 140/73 (12/03 2109) Pulse Rate: 113 (12/03 2109)  Labs:  Recent Labs  10/22/16 0426 10/23/16 0436 10/23/16 1353 10/24/16 0518 10/24/16 2003  HGB 7.9* 7.2*  --  7.2*  --   HCT 24.7* 22.5*  --  22.5*  --   PLT 462* 452*  --  462*  --   APTT 71* 43* 75* 66* 55*  HEPARINUNFRC 1.12* 0.69  --  0.72*  --   CREATININE 2.35* 1.81*  --  1.49*  --     Estimated Creatinine Clearance: 44.5 mL/min (by C-G formula based on SCr of 1.49 mg/dL (H)).  Assessment: 75 yo M with new LLE DVT (diagnosed 11/23).  The patient was started on Heparin (11/21-11/24) then transitioned to apixaban (11/24-11/26) and back to heparin (11/26 - current) due to possible need for surgery. The patient was determined to be too unstable for surgery at this time so a perc cholecystostomy was completed 11/28 in IR.  Heparin restarted post IR.   The patient's heparin level and aPTT are not correlating. The aPTT is subtherapeutic (aPTT 55, goal of 66-102).   The Hgb has still been declining slowly, down to 7.2 << 7.9, plts 452. A CT on 12/1 showed an enlarged gallbladder with a large luminal hematoma. Also noted to have bloody drainage from cholecystectomy tube. MD and surgery team aware. Will aim for the lower end of the therapeutic goal due to this.  RN reports no infusion related issues and blood loss in tube has remained minimal.  Goal of Therapy:  Heparin level 0.3-0.7 units/ml aPTT 66-102 seconds Monitor platelets by anticoagulation protocol: Yes   Plan:  1. Increase heparin to 1850 units/hr (18.5 ml/hr) 2. Will continue to monitor for any signs/symptoms of bleeding  3.  Will follow-up on transition to po anticoagulation tomorrow  Thank you for allowing pharmacy to be a part of this patient's care.  Georga Bora, PharmD Clinical Pharmacist Pager: [phone] 10/24/2016 9:40 PM

## 2016-10-25 ENCOUNTER — Encounter (HOSPITAL_COMMUNITY): Payer: Self-pay | Admitting: Interventional Radiology

## 2016-10-25 ENCOUNTER — Inpatient Hospital Stay (HOSPITAL_COMMUNITY): Payer: Commercial Managed Care - HMO

## 2016-10-25 HISTORY — PX: IR GENERIC HISTORICAL: IMG1180011

## 2016-10-25 LAB — CULTURE, BLOOD (ROUTINE X 2)
CULTURE: NO GROWTH
CULTURE: NO GROWTH

## 2016-10-25 LAB — COMPREHENSIVE METABOLIC PANEL
ALT: 46 U/L (ref 17–63)
AST: 61 U/L — AB (ref 15–41)
Albumin: 1.5 g/dL — ABNORMAL LOW (ref 3.5–5.0)
Alkaline Phosphatase: 203 U/L — ABNORMAL HIGH (ref 38–126)
Anion gap: 10 (ref 5–15)
BUN: 20 mg/dL (ref 6–20)
CHLORIDE: 99 mmol/L — AB (ref 101–111)
CO2: 26 mmol/L (ref 22–32)
CREATININE: 1.33 mg/dL — AB (ref 0.61–1.24)
Calcium: 7.8 mg/dL — ABNORMAL LOW (ref 8.9–10.3)
GFR calc Af Amer: 58 mL/min — ABNORMAL LOW (ref >60)
GFR calc non Af Amer: 50 mL/min — ABNORMAL LOW (ref >60)
GLUCOSE: 152 mg/dL — AB (ref 65–99)
Potassium: 3.7 mmol/L (ref 3.5–5.1)
SODIUM: 135 mmol/L (ref 135–145)
Total Bilirubin: 0.6 mg/dL (ref 0.3–1.2)
Total Protein: 5.5 g/dL — ABNORMAL LOW (ref 6.5–8.1)

## 2016-10-25 LAB — CBC
HCT: 21.8 % — ABNORMAL LOW (ref 39.0–52.0)
Hemoglobin: 7 g/dL — ABNORMAL LOW (ref 13.0–17.0)
MCH: 28.5 pg (ref 26.0–34.0)
MCHC: 32.1 g/dL (ref 30.0–36.0)
MCV: 88.6 fL (ref 78.0–100.0)
PLATELETS: 524 10*3/uL — AB (ref 150–400)
RBC: 2.46 MIL/uL — AB (ref 4.22–5.81)
RDW: 14.6 % (ref 11.5–15.5)
WBC: 16.4 10*3/uL — AB (ref 4.0–10.5)

## 2016-10-25 LAB — HEMOGLOBIN AND HEMATOCRIT, BLOOD
HEMATOCRIT: 26.1 % — AB (ref 39.0–52.0)
Hemoglobin: 8.6 g/dL — ABNORMAL LOW (ref 13.0–17.0)

## 2016-10-25 LAB — HEPARIN LEVEL (UNFRACTIONATED): Heparin Unfractionated: 0.65 IU/mL (ref 0.30–0.70)

## 2016-10-25 LAB — GLUCOSE, CAPILLARY
GLUCOSE-CAPILLARY: 155 mg/dL — AB (ref 65–99)
Glucose-Capillary: 155 mg/dL — ABNORMAL HIGH (ref 65–99)
Glucose-Capillary: 255 mg/dL — ABNORMAL HIGH (ref 65–99)
Glucose-Capillary: 154 mg/dL — ABNORMAL HIGH (ref 65–99)

## 2016-10-25 LAB — APTT: aPTT: 68 seconds — ABNORMAL HIGH (ref 24–36)

## 2016-10-25 LAB — PREPARE RBC (CROSSMATCH)

## 2016-10-25 LAB — ABO/RH: ABO/RH(D): B POS

## 2016-10-25 MED ORDER — FERROUS SULFATE 325 (65 FE) MG PO TABS
325.0000 mg | ORAL_TABLET | Freq: Two times a day (BID) | ORAL | Status: DC
  Administered 2016-10-26: 325 mg via ORAL
  Filled 2016-10-25: qty 1

## 2016-10-25 MED ORDER — APIXABAN 5 MG PO TABS: 5.0000 mg | ORAL_TABLET | Freq: Two times a day (BID) | ORAL | Status: DC

## 2016-10-25 MED ORDER — LIDOCAINE HCL 1 % IJ SOLN
INTRAMUSCULAR | Status: AC
  Filled 2016-10-25: qty 20

## 2016-10-25 MED ORDER — APIXABAN 5 MG PO TABS
5.0000 mg | ORAL_TABLET | Freq: Two times a day (BID) | ORAL | Status: DC
  Administered 2016-10-25 – 2016-10-26 (×2): 5 mg via ORAL
  Filled 2016-10-25 (×2): qty 1

## 2016-10-25 MED ORDER — FUROSEMIDE 10 MG/ML IJ SOLN
40.0000 mg | Freq: Once | INTRAMUSCULAR | Status: AC
  Administered 2016-10-25: 40 mg via INTRAVENOUS
  Filled 2016-10-25: qty 4

## 2016-10-25 MED ORDER — LIDOCAINE HCL 1 % IJ SOLN
INTRAMUSCULAR | Status: DC | PRN
  Administered 2016-10-25: 10 mL

## 2016-10-25 MED ORDER — APIXABAN 5 MG PO TABS
10.0000 mg | ORAL_TABLET | Freq: Two times a day (BID) | ORAL | Status: DC
  Administered 2016-10-25: 10 mg via ORAL
  Filled 2016-10-25: qty 2

## 2016-10-25 MED ORDER — IOPAMIDOL (ISOVUE-300) INJECTION 61%
INTRAVENOUS | Status: AC
  Administered 2016-10-25: 10 mL
  Filled 2016-10-25: qty 50

## 2016-10-25 NOTE — Progress Notes (Signed)
Patient ID: Stephen Bales Sr., male   DOB: 1940-12-02, 75 y.o.   MRN: [id number]    Referring Physician(s):  Dr. Rolm Bookbinder  Supervising Physician: [name]  Patient Status:  [facility] - In-pt  Chief Complaint:  Acute cholecystitis  Subjective:  Patient states he is feeling well.  Continues to deny pain.  States he is looking forward to discharge.  No drain output recorded- per RN, scant/minimal bloody output in past 24 hrs.   Allergies: Patient has no known allergies.  Medications: Prior to Admission medications   Medication Sig Start Date End Date Taking? Authorizing Provider  aspirin EC 81 MG tablet Take 81 mg by mouth every morning.    Yes Historical Provider, MD  cloNIDine (CATAPRES) 0.2 MG tablet Take 0.2 mg by mouth daily.    Yes Historical Provider, MD  furosemide (LASIX) 20 MG tablet Take 10 mg by mouth daily as needed for edema or fluid. 08/14/16  Yes Historical Provider, MD  glipiZIDE (GLUCOTROL) 10 MG tablet Take 10 mg by mouth daily before breakfast.    Yes Historical Provider, MD  hydrALAZINE (APRESOLINE) 25 MG tablet Take 25 mg by mouth every evening.    Yes Historical Provider, MD  lisinopril (PRINIVIL,ZESTRIL) 40 MG tablet Take 20 mg by mouth every evening.    Yes Historical Provider, MD  metFORMIN (GLUCOPHAGE) 1000 MG tablet Take 1,000 mg by mouth every evening.    Yes Historical Provider, MD  simvastatin (ZOCOR) 40 MG tablet Take 40 mg by mouth every evening.    Yes Historical Provider, MD  verapamil (CALAN-SR) 240 MG CR tablet Take 240 mg by mouth at bedtime.   Yes Historical Provider, MD    Vital Signs: BP 139/72   Pulse 100   Temp (!) 100.4 F (38 C) (Oral)   Resp 18   Ht 5\' 6"  (1.676 m)   Wt 206 lb 4.8 oz (93.6 kg)   SpO2 94%   BMI 33.30 kg/m   Physical Exam  Awake and alert Abdomen soft, NTND Drain in place with minimal bloody output.  No output value recorded.  Flushes easily with 2 mL saline. Insertion site clean/dry/intact.    Imaging: Ct Abdomen Pelvis Wo Contrast  Result Date: 10/22/2016 CLINICAL DATA:  75 y/o M; right-sided abdominal pain after percutaneous cholecystostomy. EXAM: CT ABDOMEN AND PELVIS WITHOUT CONTRAST TECHNIQUE: Multidetector CT imaging of the abdomen and pelvis was performed following the standard protocol without IV contrast. COMPARISON:  10/17/2016 abdominal ultrasound. 10/16/2016 abdominal MRI. 10/13/2016 CT abdomen. FINDINGS: Lower chest: Small right greater than left pleural effusions with dependent opacities likely representing atelectasis. Hepatobiliary: No focal liver abnormality. There is a cholecystostomy tube with pigtail in the upper gallbladder fundus. The gallbladder is markedly enlarged small foci of air in a large high attenuation collection likely representing a hematoma. Pancreas: Unremarkable. No pancreatic ductal dilatation or surrounding inflammatory changes. Spleen: Normal in size without focal abnormality. Adrenals/Urinary Tract: Adrenal glands are unremarkable. Stable left kidney interpolar and right kidney upper pole cysts. Kidneys are otherwise normal, without renal calculi, focal lesion, or hydronephrosis. Bladder is unremarkable. Stomach/Bowel: Stomach is within normal limits. Appendix appears normal. No evidence of bowel wall thickening, distention, or inflammatory changes. Vascular/Lymphatic: Aortic atherosclerosis. No enlarged abdominal or pelvic lymph nodes. Reproductive: Prostate is unremarkable. Other: Small volume of right hemi abdomen ascites. Musculoskeletal: Multilevel degenerative changes of the thoracic and lumbar spine and mild lumbar levocurvature. Mild bilateral hip osteoarthrosis. No acute osseous abnormality is evident. IMPRESSION: 1. Cholecystostomy tube  within the gallbladder fundus. Gallbladder is markedly enlarged with large luminal hematoma. 2. Small volume of right hemi abdomen ascites is likely reactive. 3. Small bilateral pleural effusions with dependent  atelectasis. 4. Aortic atherosclerosis. These results will be called to the ordering clinician or representative by the Radiologist Assistant, and communication documented in the PACS or zVision Dashboard. Electronically Signed   By: Kristine Garbe M.D.   On: 10/22/2016 17:07    Labs:  CBC:  Recent Labs  10/22/16 0426 10/23/16 0436 10/24/16 0518 10/25/16 0637  WBC 19.9* 18.0* 17.2* 16.4*  HGB 7.9* 7.2* 7.2* 7.0*  HCT 24.7* 22.5* 22.5* 21.8*  PLT 462* 452* 462* 524*    COAGS:  Recent Labs  10/13/16 1414  10/23/16 1353 10/24/16 0518 10/24/16 2003 10/25/16 0637  INR 1.16  --   --   --   --   --   APTT 27  < > 75* 66* 55* 68*  < > = values in this interval not displayed.  BMP:  Recent Labs  10/22/16 0426 10/23/16 0436 10/24/16 0518 10/25/16 0637  NA 135 135 133* 135  K 3.8 3.8 3.8 3.7  CL 96* 95* 95* 99*  CO2 27 27 27 26   GLUCOSE 230* 207* 121* 152*  BUN 41* 36* 27* 20  CALCIUM 7.9* 7.8* 7.7* 7.8*  CREATININE 2.35* 1.81* 1.49* 1.33*  GFRNONAA 25* 34* 44* 50*  GFRAA 29* 40* 50* 58*    LIVER FUNCTION TESTS:  Recent Labs  10/18/16 0544 10/20/16 0548 10/21/16 0603 10/25/16 0637  BILITOT 2.1* 1.3* 1.2 0.6  AST 117* 69* 62* 61*  ALT 161* 83* 72* 46  ALKPHOS 466* 305* 257* 203*  PROT 6.3* 6.2* 6.6 5.5*  ALBUMIN 1.9* 1.6* 1.6* 1.5*    Assessment and Plan:  Acute cholecystitis s/p cholecystostomy by Dr. Kathlene Cote on 11/28 Hgb 7.0 today.  Patient receiving 1u PRBC at time of visit. WBC 16.4 Reviewed CT findings of gallbladder hematoma with Dr. Vernard Gambles who recommends a cholecystostomy tube exchange.  Patient has been placed on IR schedule.   RN notified of planned procedure.   Electronically Signed: Docia Barrier 10/25/2016, 2:37 PM   I spent a total of 25 Minutes at the the patient's bedside AND on the patient's hospital floor or unit, greater than 50% of which was counseling/coordinating care for acute cholecystitis.

## 2016-10-25 NOTE — Clinical Social Work Note (Signed)
[employer] representative Shae contacted regarding insurance authorization for patient to [facility]. Patient will discharge to facility on Tuesday, 12/5.  Odis Turck Givens, MSW, LCSW Licensed Clinical Social Worker [place] [phone]

## 2016-10-25 NOTE — Progress Notes (Signed)
PT Cancellation Note  Patient Details Name: Stephen BONSALL Sr. MRN: [id number] DOB: December 13, 1940   Cancelled Treatment:    Reason Eval/Treat Not Completed: Other (comment) (just started blood transfusion, unable to see at this time)   [facility] 10/25/2016, 12:57 PM

## 2016-10-25 NOTE — Progress Notes (Signed)
  Subjective: Denies pain  Objective: Vital signs in last 24 hours: Temp:  [98.8 F (37.1 C)-100.5 F (38.1 C)] 100.5 F (38.1 C) (12/04 0505) Pulse Rate:  [102-113] 102 (12/04 0505) Resp:  [16-18] 16 (12/04 0505) BP: (129-144)/(72-80) 144/80 (12/04 0505) SpO2:  [87 %-96 %] 96 % (12/04 0505) Last BM Date: 10/23/16  Intake/Output from previous day: 12/03 0701 - 12/04 0700 In: 2162.4 [P.O.:600; I.V.:1562.4] Out: 1825 [Urine:1825] Intake/Output this shift: No intake/output data recorded.  Exam: Comfortable Abdomen soft, non tender Drain with old blood, no bile  Lab Results:   Recent Labs  10/24/16 0518 10/25/16 0637  WBC 17.2* 16.4*  HGB 7.2* 7.0*  HCT 22.5* 21.8*  PLT 462* 524*   BMET  Recent Labs  10/24/16 0518 10/25/16 0637  NA 133* 135  K 3.8 3.7  CL 95* 99*  CO2 27 26  GLUCOSE 121* 152*  BUN 27* 20  CREATININE 1.49* 1.33*  CALCIUM 7.7* 7.8*   PT/INR No results for input(s): LABPROT, INR in the last 72 hours. ABG No results for input(s): PHART, HCO3 in the last 72 hours.  Invalid input(s): PCO2, PO2  Studies/Results: No results found.  Anti-infectives: Anti-infectives    Start     Dose/Rate Route Frequency Ordered Stop   10/23/16 1400  metroNIDAZOLE (FLAGYL) tablet 500 mg     500 mg Oral Every 8 hours 10/23/16 1026     10/23/16 1254  ciprofloxacin (CIPRO) 500 MG tablet    Comments:  [name]   : cabinet override      10/23/16 1254 10/23/16 1300   10/23/16 1100  ciprofloxacin (CIPRO) tablet 500 mg     500 mg Oral 2 times daily 10/23/16 1047     10/23/16 1030  ciprofloxacin (CIPRO) tablet 500 mg  Status:  Discontinued     500 mg Oral Daily 10/23/16 1026 10/23/16 1047   10/21/16 2100  Ampicillin-Sulbactam (UNASYN) 3 g in sodium chloride 0.9 % 100 mL IVPB  Status:  Discontinued     3 g 200 mL/hr over 30 Minutes Intravenous Every 12 hours 10/21/16 1548 10/23/16 1026   10/19/16 0915  ertapenem (INVANZ) 1 g in sodium chloride 0.9 % 50 mL  IVPB  Status:  Discontinued     1 g 100 mL/hr over 30 Minutes Intravenous Every 24 hours 10/19/16 0909 10/19/16 0910   10/17/16 0400  piperacillin-tazobactam (ZOSYN) IVPB 3.375 g  Status:  Discontinued     3.375 g 12.5 mL/hr over 240 Minutes Intravenous Every 8 hours 10/16/16 2122 10/21/16 1519   10/16/16 2200  piperacillin-tazobactam (ZOSYN) IVPB 3.375 g     3.375 g 100 mL/hr over 30 Minutes Intravenous  Once 10/16/16 2122 10/16/16 2226      Assessment/Plan: Cholecystitis s/p perc chole drain placement  IR probably aught to address drain to see if any repositioning needed although I suspect intraluminal hematoma will liquify WBC continues slow downward trend. followup with Dr. Donne Hazel as outpt  LOS: 12 days    Stephen Rogers A 10/25/2016

## 2016-10-25 NOTE — Progress Notes (Signed)
PROGRESS NOTE [address]   Stephen HANNULA Sr.   [id number] DOB: Dec 31, 1940  DOA: 10/12/2016 PCP: [name], MD   Brief Narrative:  75 year old African-American male with a past medical history of hypertension, diabetes, presented to the hospital with complaints of leg swelling and inability to walk. Patient was recently hospitalized to [facility] from November 16 November 20 for acute renal failure. He was given IV fluids. He went home and noted that his legs were swollen. His Lasix was not resumed. He found it difficult to ambulate so he presented to the hospital here. He was hospitalized for further management. Patient diuresed well. He was also found to have acute DVT for which he was placed on anticoagulation. His LFTs were improving, but then he started spiking fever. He was seen by gastroenterology. Subsequently, general surgery was consulted as well for suspected cholecystitis. Patient is s/p perc cholecystostomy, draining bloody bile. Patient started having fever, blood cultures were repeated, no source of infection was found. CT abdomen was repeated showed gallbladder hematoma explaining drop in Hb. ID was consulted Abx were switched. Patient subsequently improved. Patient has remains afebrile for the past 48 hrs.  Subjective: Patient seen and examined. Doing well with no complaints patient. She had low hemoglobin 7 patient reported feels short of breath. Otherwise doing better no nausea no vomiting or diarrhea. No acute events overnight. White count slowly trending down.  Assessment & Plan: Fever with increase in WBC- improving - unknown source at this time - ? From gallbladder hematoma vs drug fever antibiotics where switch and patient has remain afebrile  repeated blood cultures from 11/29 so far negative, CXR no PNA  ID input appreciated  Continue cipro/flagyl - will complete 3 week of from the day of negative blood cultures  Encouraged incentive  spirometry use Urine culture negative   Escherichia coli bacteremia and sepsis - pansensitive, sepsis physiology has resolved Pansensitive Escherichia coli, can be discharged on oral amoxicillin or Cipro for total of 3 weeks from the day of second blood cultures, 10/16/2016 - 11/06/2016 See above   Acute cholecystitis, status post percutaneous cholecystostomy with tubes in place.  IR to exchange cholecystostomy tube On admission with Fever, transaminitis and jaundice,  Abdominal ultrasound showed multiple gallstones. HIDA scan showing cystic duct obstruction consistent with acute cholecystitis. Gallbladder cx show E coli pansensitive  Continue abx   Chronic kidney disease stage 3 - Acute kidney injury - 2/2 dehydration in combination with ACE and Lasix - significantly improved back to baseline. DC IV fluids Continue to hold ACEi Repeat BMP in AM   Normocytic anemia - has been dropping likely 2/2 to gallbladder hematoma and hemodilution. Status post 1 unit of PRBC Will continue to monitor If Hb <7 or develop symptoms with current level will transfuse  Continue iron supplement   Transaminitis  Likely secondary to acute cholecystitis Repeat CMP in am   Acute Distal left lower extremity DVT Likely due to recent hospitalization and sedentary status. DC heparin Resume Eliquis at low-dose given recent bleed. Patient was previously Eliquis note for initial regimen.   Elevated troponin/abnormal EKG Patient with nonspecific changes on EKG. No acute ischemic changes noted. Patient denies any chest pains. Echocardiogram does not show any wall motion abnormalities. G1DD. No further work up at this time  Essential hypertension- stable  ACEi are being held due to AKI BP has remained stable, this medication will not be resume on discharge   Continue hydralazine and verapamil  Monitor BP  Diabetes mellitus type 2 - CBGs better with Levemir  Continue SSI  Monitor CBG's Increase  Levemir to 10 units BID   Hyperlipidemia Will resume statin upon discharge    DVT prophylaxis: Heparin  Code Status: Full  Family Communication: None at bedside  Disposition Plan: Anticipate discharge to SNF tomorrow   Consultants:   Surgery   IR   Procedures:   Percutaneous Cholecystostomy   Antimicrobials:   Zosyn 11/25   Objective: Vitals:   10/25/16 0952 10/25/16 1230 10/25/16 1254 10/25/16 1539  BP: 137/70 137/83 139/72 (!) 141/67  Pulse: (!) 104 100 100 (!) 102  Resp: 18 18 18 16   Temp: 98.5 F (36.9 C) 97.2 F (36.2 C) (!) 100.4 F (38 C) 99.5 F (37.5 C)  TempSrc: Oral Oral Oral Oral  SpO2: 93% 96% 94% 93%  Weight:      Height:        Intake/Output Summary (Last 24 hours) at 10/25/16 1730 Last data filed at 10/25/16 1539  Gross per 24 hour  Intake          1815.44 ml  Output             1825 ml  Net            -9.56 ml   Filed Weights   10/21/16 2151 10/22/16 2123 10/23/16 2105  Weight: 90.4 kg (199 lb 4.7 oz) 87.4 kg (192 lb 10.9 oz) 93.6 kg (206 lb 4.8 oz)    Examination:   General exam: NAD Respiratory system:  Clear to auscultation Cardiovascular system: S1 & S2 heard, RRR.  Gastrointestinal system: Obese, soft, drainage in place with bloody bile Extremities: 2+ pedal edema Skin: No lesion or ulcers   Data Reviewed: I have personally reviewed following labs and imaging studies  CBC:  Recent Labs Lab 10/21/16 0603 10/22/16 0426 10/23/16 0436 10/24/16 0518 10/25/16 0637  WBC 17.4* 19.9* 18.0* 17.2* 16.4*  NEUTROABS  --   --  14.1*  --   --   HGB 8.3* 7.9* 7.2* 7.2* 7.0*  HCT 26.1* 24.7* 22.5* 22.5* 21.8*  MCV 89.1 88.8 88.2 88.2 88.6  PLT 458* 462* 452* 462* [id number]*   Basic Metabolic Panel:  Recent Labs Lab 10/21/16 0603 10/22/16 0426 10/23/16 0436 10/24/16 0518 10/25/16 0637  NA 135 135 135 133* 135  K 4.1 3.8 3.8 3.8 3.7  CL 97* 96* 95* 95* 99*  CO2 27 27 27 27 26   GLUCOSE 174* 230* 207* 121* 152*  BUN 36* 41*  36* 27* 20  CREATININE 2.37* 2.35* 1.81* 1.49* 1.33*  CALCIUM 7.9* 7.9* 7.8* 7.7* 7.8*   GFR: Estimated Creatinine Clearance: 49.8 mL/min (by C-G formula based on SCr of 1.33 mg/dL (H)). Liver Function Tests:  Recent Labs Lab 10/20/16 0548 10/21/16 0603 10/25/16 0637  AST 69* 62* 61*  ALT 83* 72* 46  ALKPHOS 305* 257* 203*  BILITOT 1.3* 1.2 0.6  PROT 6.2* 6.6 5.5*  ALBUMIN 1.6* 1.6* 1.5*   No results for input(s): LIPASE, AMYLASE in the last 168 hours. No results for input(s): AMMONIA in the last 168 hours. Coagulation Profile: No results for input(s): INR, PROTIME in the last 168 hours. Cardiac Enzymes: No results for input(s): CKTOTAL, CKMB, CKMBINDEX, TROPONINI in the last 168 hours. BNP (last 3 results) No results for input(s): PROBNP in the last 8760 hours. HbA1C: No results for input(s): HGBA1C in the last 72 hours. CBG:  Recent Labs Lab 10/24/16 1732 10/24/16 2106 10/25/16 0748 10/25/16 1148  10/25/16 1718  GLUCAP 192* 193* 155* 255* 155*   Lipid Profile: No results for input(s): CHOL, HDL, LDLCALC, TRIG, CHOLHDL, LDLDIRECT in the last 72 hours. Thyroid Function Tests: No results for input(s): TSH, T4TOTAL, FREET4, T3FREE, THYROIDAB in the last 72 hours. Anemia Panel: No results for input(s): VITAMINB12, FOLATE, FERRITIN, TIBC, IRON, RETICCTPCT in the last 72 hours. Sepsis Labs: No results for input(s): PROCALCITON, LATICACIDVEN in the last 168 hours.  Recent Results (from the past 240 hour(s))  Culture, blood (routine x 2)     Status: None   Collection Time: 10/16/16 10:16 PM  Result Value Ref Range Status   Specimen Description BLOOD LEFT HAND  Final   Special Requests BOTTLES DRAWN AEROBIC AND ANAEROBIC 5ML  Final   Culture NO GROWTH 5 DAYS  Final   Report Status 10/22/2016 FINAL  Final  Culture, blood (routine x 2)     Status: Abnormal   Collection Time: 10/16/16 10:25 PM  Result Value Ref Range Status   Specimen Description BLOOD RIGHT HAND   Final   Special Requests IN PEDIATRIC BOTTLE 3ML  Final   Culture  Setup Time   Final    GRAM NEGATIVE RODS IN PEDIATRIC BOTTLE CRITICAL RESULT CALLED TO, READ BACK BY AND VERIFIED WITH: C BALL,PHARMD AT 1405 10/17/16 BY L BENFIELD    Culture ESCHERICHIA COLI (A)  Final   Report Status 10/19/2016 FINAL  Final   Organism ID, Bacteria ESCHERICHIA COLI  Final      Susceptibility   Escherichia coli - MIC*    AMPICILLIN <=2 SENSITIVE Sensitive     CEFAZOLIN <=4 SENSITIVE Sensitive     CEFEPIME <=1 SENSITIVE Sensitive     CEFTAZIDIME <=1 SENSITIVE Sensitive     CEFTRIAXONE <=1 SENSITIVE Sensitive     CIPROFLOXACIN <=0.25 SENSITIVE Sensitive     GENTAMICIN <=1 SENSITIVE Sensitive     IMIPENEM <=0.25 SENSITIVE Sensitive     TRIMETH/SULFA <=20 SENSITIVE Sensitive     AMPICILLIN/SULBACTAM <=2 SENSITIVE Sensitive     PIP/TAZO <=4 SENSITIVE Sensitive     Extended ESBL NEGATIVE Sensitive     * ESCHERICHIA COLI  Blood Culture ID Panel (Reflexed)     Status: Abnormal   Collection Time: 10/16/16 10:25 PM  Result Value Ref Range Status   Enterococcus species NOT DETECTED NOT DETECTED Final   Listeria monocytogenes NOT DETECTED NOT DETECTED Final   Staphylococcus species NOT DETECTED NOT DETECTED Final   Staphylococcus aureus NOT DETECTED NOT DETECTED Final   Streptococcus species NOT DETECTED NOT DETECTED Final   Streptococcus agalactiae NOT DETECTED NOT DETECTED Final   Streptococcus pneumoniae NOT DETECTED NOT DETECTED Final   Streptococcus pyogenes NOT DETECTED NOT DETECTED Final   Acinetobacter baumannii NOT DETECTED NOT DETECTED Final   Enterobacteriaceae species DETECTED (A) NOT DETECTED Final    Comment: CRITICAL RESULT CALLED TO, READ BACK BY AND VERIFIED WITH: C BALL,PHARMD AT 1405 10/17/16 BY L BENFIELD    Enterobacter cloacae complex NOT DETECTED NOT DETECTED Final   Escherichia coli DETECTED (A) NOT DETECTED Final    Comment: CRITICAL RESULT CALLED TO, READ BACK BY AND VERIFIED  WITH: C BALL,PHARMD AT 1405 10/17/16 BY L BENFIELD    Klebsiella oxytoca NOT DETECTED NOT DETECTED Final   Klebsiella pneumoniae NOT DETECTED NOT DETECTED Final   Proteus species NOT DETECTED NOT DETECTED Final   Serratia marcescens NOT DETECTED NOT DETECTED Final   Carbapenem resistance NOT DETECTED NOT DETECTED Final   Haemophilus influenzae NOT DETECTED NOT  DETECTED Final   Neisseria meningitidis NOT DETECTED NOT DETECTED Final   Pseudomonas aeruginosa NOT DETECTED NOT DETECTED Final   Candida albicans NOT DETECTED NOT DETECTED Final   Candida glabrata NOT DETECTED NOT DETECTED Final   Candida krusei NOT DETECTED NOT DETECTED Final   Candida parapsilosis NOT DETECTED NOT DETECTED Final   Candida tropicalis NOT DETECTED NOT DETECTED Final  Aerobic/Anaerobic Culture (surgical/deep wound)     Status: None   Collection Time: 10/19/16  4:28 PM  Result Value Ref Range Status   Specimen Description GALL BLADDER  Final   Special Requests Normal  Final   Gram Stain   Final    ABUNDANT WBC PRESENT,BOTH PMN AND MONONUCLEAR NO ORGANISMS SEEN    Culture FEW ESCHERICHIA COLI NO ANAEROBES ISOLATED   Final   Report Status 10/24/2016 FINAL  Final   Organism ID, Bacteria ESCHERICHIA COLI  Final      Susceptibility   Escherichia coli - MIC*    AMPICILLIN <=2 SENSITIVE Sensitive     CEFAZOLIN <=4 SENSITIVE Sensitive     CEFEPIME <=1 SENSITIVE Sensitive     CEFTAZIDIME <=1 SENSITIVE Sensitive     CEFTRIAXONE <=1 SENSITIVE Sensitive     CIPROFLOXACIN <=0.25 SENSITIVE Sensitive     GENTAMICIN <=1 SENSITIVE Sensitive     IMIPENEM <=0.25 SENSITIVE Sensitive     TRIMETH/SULFA <=20 SENSITIVE Sensitive     AMPICILLIN/SULBACTAM <=2 SENSITIVE Sensitive     PIP/TAZO <=4 SENSITIVE Sensitive     Extended ESBL NEGATIVE Sensitive     * FEW ESCHERICHIA COLI  Culture, blood (Routine X 2) w Reflex to ID Panel     Status: None   Collection Time: 10/20/16  6:50 PM  Result Value Ref Range Status   Specimen  Description BLOOD LEFT HAND  Final   Special Requests BLOOD AEROBIC BOTTLE 10 CC, BANA 5 CC  Final   Culture NO GROWTH 5 DAYS  Final   Report Status 10/25/2016 FINAL  Final  Culture, blood (Routine X 2) w Reflex to ID Panel     Status: None   Collection Time: 10/20/16  6:58 PM  Result Value Ref Range Status   Specimen Description BLOOD RIGHT HAND  Final   Special Requests BLOOD AEROBIC BOTTLE 5 CC  Final   Culture NO GROWTH 5 DAYS  Final   Report Status 10/25/2016 FINAL  Final  Culture, Urine     Status: None   Collection Time: 10/22/16  6:49 PM  Result Value Ref Range Status   Specimen Description URINE, CATHETERIZED  Final   Special Requests NONE  Final   Culture NO GROWTH  Final   Report Status 10/24/2016 FINAL  Final     Radiology Studies: No results found.  Scheduled Meds: . apixaban  10 mg Oral BID   Followed by  . [START ON 11/01/2016] apixaban  5 mg Oral BID  . aspirin EC  81 mg Oral Daily  . ciprofloxacin  500 mg Oral BID  . docusate sodium  100 mg Oral BID  . hydrALAZINE  25 mg Oral QPM  . insulin aspart  0-5 Units Subcutaneous QHS  . insulin aspart  0-9 Units Subcutaneous TID WC  . insulin detemir  10 Units Subcutaneous BID  . lidocaine      . metroNIDAZOLE  500 mg Oral Q8H  . polyethylene glycol  17 g Oral Daily  . sodium chloride flush  10-40 mL Intracatheter Q12H  . verapamil  240 mg Oral QHS  Continuous Infusions: . sodium chloride 50 mL/hr at 10/23/16 1342     LOS: 12 days   Chipper Oman, MD [facility] Pager [phone]  If 7PM-7AM, please contact night-coverage [email or web address] Password [email or web address] 10/25/2016, 5:30 PM

## 2016-10-26 DIAGNOSIS — I82492 Acute embolism and thrombosis of other specified deep vein of left lower extremity: Secondary | ICD-10-CM | POA: Diagnosis not present

## 2016-10-26 DIAGNOSIS — E119 Type 2 diabetes mellitus without complications: Secondary | ICD-10-CM | POA: Diagnosis not present

## 2016-10-26 DIAGNOSIS — K5900 Constipation, unspecified: Secondary | ICD-10-CM | POA: Diagnosis not present

## 2016-10-26 DIAGNOSIS — A4151 Sepsis due to Escherichia coli [E. coli]: Secondary | ICD-10-CM | POA: Diagnosis not present

## 2016-10-26 DIAGNOSIS — N183 Chronic kidney disease, stage 3 (moderate): Secondary | ICD-10-CM | POA: Diagnosis not present

## 2016-10-26 DIAGNOSIS — R740 Nonspecific elevation of levels of transaminase and lactic acid dehydrogenase [LDH]: Secondary | ICD-10-CM | POA: Diagnosis not present

## 2016-10-26 DIAGNOSIS — D72829 Elevated white blood cell count, unspecified: Secondary | ICD-10-CM | POA: Diagnosis not present

## 2016-10-26 DIAGNOSIS — R41841 Cognitive communication deficit: Secondary | ICD-10-CM | POA: Diagnosis not present

## 2016-10-26 DIAGNOSIS — R509 Fever, unspecified: Secondary | ICD-10-CM | POA: Diagnosis not present

## 2016-10-26 DIAGNOSIS — D649 Anemia, unspecified: Secondary | ICD-10-CM | POA: Diagnosis not present

## 2016-10-26 DIAGNOSIS — K830 Cholangitis: Secondary | ICD-10-CM | POA: Diagnosis not present

## 2016-10-26 DIAGNOSIS — I10 Essential (primary) hypertension: Secondary | ICD-10-CM | POA: Diagnosis not present

## 2016-10-26 DIAGNOSIS — R6520 Severe sepsis without septic shock: Secondary | ICD-10-CM | POA: Diagnosis not present

## 2016-10-26 DIAGNOSIS — I82409 Acute embolism and thrombosis of unspecified deep veins of unspecified lower extremity: Secondary | ICD-10-CM | POA: Diagnosis not present

## 2016-10-26 DIAGNOSIS — M6281 Muscle weakness (generalized): Secondary | ICD-10-CM | POA: Diagnosis not present

## 2016-10-26 DIAGNOSIS — K819 Cholecystitis, unspecified: Secondary | ICD-10-CM | POA: Diagnosis not present

## 2016-10-26 DIAGNOSIS — R600 Localized edema: Secondary | ICD-10-CM | POA: Diagnosis not present

## 2016-10-26 DIAGNOSIS — K810 Acute cholecystitis: Secondary | ICD-10-CM | POA: Diagnosis not present

## 2016-10-26 DIAGNOSIS — R531 Weakness: Secondary | ICD-10-CM | POA: Diagnosis not present

## 2016-10-26 LAB — BASIC METABOLIC PANEL
Anion gap: 10 (ref 5–15)
BUN: 18 mg/dL (ref 6–20)
CHLORIDE: 99 mmol/L — AB (ref 101–111)
CO2: 26 mmol/L (ref 22–32)
CREATININE: 1.24 mg/dL (ref 0.61–1.24)
Calcium: 8 mg/dL — ABNORMAL LOW (ref 8.9–10.3)
GFR calc Af Amer: >60 mL/min (ref >60)
GFR calc non Af Amer: 54 mL/min — ABNORMAL LOW (ref >60)
GLUCOSE: 161 mg/dL — AB (ref 65–99)
POTASSIUM: 4.8 mmol/L (ref 3.5–5.1)
SODIUM: 135 mmol/L (ref 135–145)

## 2016-10-26 LAB — CBC
HEMATOCRIT: 25.8 % — AB (ref 39.0–52.0)
Hemoglobin: 8.4 g/dL — ABNORMAL LOW (ref 13.0–17.0)
MCH: 28.2 pg (ref 26.0–34.0)
MCHC: 32.6 g/dL (ref 30.0–36.0)
MCV: 86.6 fL (ref 78.0–100.0)
PLATELETS: 611 10*3/uL — AB (ref 150–400)
RBC: 2.98 MIL/uL — ABNORMAL LOW (ref 4.22–5.81)
RDW: 15.7 % — AB (ref 11.5–15.5)
WBC: 16.1 10*3/uL — AB (ref 4.0–10.5)

## 2016-10-26 LAB — GLUCOSE, CAPILLARY
GLUCOSE-CAPILLARY: 156 mg/dL — AB (ref 65–99)
GLUCOSE-CAPILLARY: 230 mg/dL — AB (ref 65–99)

## 2016-10-26 MED ORDER — CIPROFLOXACIN HCL 500 MG PO TABS: 500.0000 mg | ORAL_TABLET | Freq: Two times a day (BID) | ORAL | 0 refills | Status: AC

## 2016-10-26 MED ORDER — APIXABAN 5 MG PO TABS: 5.0000 mg | ORAL_TABLET | Freq: Two times a day (BID) | ORAL | 0 refills | Status: DC

## 2016-10-26 NOTE — Discharge Summary (Signed)
Physician Discharge Summary  Stephen DELEO Sr.  [id number]  DOB: 1941-05-11  DOA: 10/12/2016 PCP: [name], MD  Admit date: 10/12/2016 Discharge date: 10/26/2016  Admitted From: Home  Disposition: SNF   Recommendations for Outpatient Follow-up:  1. Follow up with PCP in 1-2 weeks 2. Please obtain BMP/CBC in one week 3. Please follow up on the following pending results:  Home Health: None  Equipment/Devices: None   Discharge Condition: Stable  CODE STATUS: Full  Diet recommendation: Heart Healthy / Carb Modified  Brief/Interim Summary: 75 year old African-American male with a past medical history of hypertension, diabetes, presented to the hospital with complaints of leg swelling and inability to walk. Patient was recently hospitalized to [facility] ([place]) from November 16 November 20 for acute renal failure. He was given IV fluids. He went home and noted that his legs were swollen. His Lasix was not resumed. He found it difficult to ambulate so he presented to the hospital here. He was hospitalized for further management. Patient diuresed well. He was also found to have acute DVT for which he was placed on anticoagulation, but then he started spiking fever. Blood cultures where drawn, found to be positive for E Coli. ID was consulted and started on appropriated antibiotics. LFT were noted to be high. [place] was done show gallstones. He was seen by gastroenterology. Subsequently, general surgery was consulted as well for suspected cholecystitis. HIDA scan was done showing cystic duct obstruction consistent with acute cholecystitis. Surgery consulted IR for perc drainage. Patient is s/p perc cholecystostomy, draining bloody bile. Patient started having fever on 11/30, blood cultures were repeated, no source of infection was found. CXR showed atelectasis. Patient was given incentive spirometry. CT abdomen was repeated showed gallbladder hematoma explaining drop in Hb and fever. Patient  was transfuse 1 unit of PRBC's. Patient subsequently improved. Patient has remains afebrile for the past 48 hrs. Cholecystostomy tube was exchanged. Now with good drainage. He is plan to have cholecystectomy in 4 weeks. He will be discharge to SNF due to deconditioning from acute illness, where he will receive physical therapy.   Subjective: Patient seen and examined at bedside. Feeling much better, tolerated well the procedure. Remains afebrile. Tolerating diet well.   Discharge Diagnoses:  Fever with increase in WBC- Fever resolved, WBC trending down slowly  - Likely from gallbladder hematoma vs drug fever  Antibiotics where switch and patient has remain afebrile  Repeated blood cultures from 11/29 negative CXR no PNA  Continue cipro - will complete 3 week of from the day of negative blood cultures   Escherichia coli bacteremia and sepsis - pansensitive, sepsis physiology has resolved Pansensitive Escherichia coli, can be discharged on oral amoxicillin or Cipro for total of 3 weeks from the day of second blood cultures, 10/16/2016 - 11/06/2016 See above   Acute cholecystitis, status post percutaneous cholecystostomy with tubes in place.  On admission with Fever, transaminitis and jaundice,  Abdominal ultrasound showed multiple gallstones. HIDA scan showing cystic duct obstructionconsistent with acute cholecystitis. Gallbladder cx show E coli pansensitive  IR exchanged cholecystectomy tube Keep tube until surgery, drain as needed. If surgery does not occur within 4 weeks patient will need to have the tube exchange in 6-8 weeks. [facility] Patient to follow up with Dr Donne Hazel on 12/29 @ 9:00 AM  Chronic kidney disease stage 3 - Acute kidney injury - 2/2 dehydration in combination with ACE and Lasix - Cr back to baseline Use Lasix PRN  Monitor  BMP in 1 week   Normocytic anemia - likely 2/2 to gallbladder hematoma and hemodilution. Status post 1 unit of  PRBC Hb 8.4  Monitor CBC in 1 week   Transaminitis  Likely secondary to acute cholecystitis Repeat CMP in am   Acute Distal left lower extremity DVT Likely due to recent hospitalization and sedentary status. Continue Eliquis  At least month of anticoagulation. Check with PCP if long term needed  Elevated troponin/abnormal EKG Patient with nonspecific changes on EKG. No acute ischemic changes noted. Patient denies any chest pains. Echocardiogram does not show any wall motion abnormalities. G1DD. No further work up at this time  Essential hypertension ACEi and clonidine d/ced BP has remained stable on hydralazine and verapamil  Monitor BP   Diabetes mellitus type 2 - Patient was treated with insulin while in the hospital  Monitor CBG's Resume home medications   Hyperlipidemia Resume statin     Discharge Instructions  Discharge Instructions    Call MD for:  difficulty breathing, headache or visual disturbances    Complete by:  As directed    Call MD for:  extreme fatigue    Complete by:  As directed    Call MD for:  hives    Complete by:  As directed    Call MD for:  persistant dizziness or light-headedness    Complete by:  As directed    Call MD for:  persistant nausea and vomiting    Complete by:  As directed    Call MD for:  redness, tenderness, or signs of infection (pain, swelling, redness, odor or green/yellow discharge around incision site)    Complete by:  As directed    Call MD for:  severe uncontrolled pain    Complete by:  As directed    Call MD for:  temperature >100.4    Complete by:  As directed    Diet - low sodium heart healthy    Complete by:  As directed    Discharge instructions    Complete by:  As directed    Increase activity slowly    Complete by:  As directed        Medication List    STOP taking these medications   cloNIDine 0.2 MG tablet Commonly known as:  CATAPRES   lisinopril 40 MG tablet Commonly known as:   PRINIVIL,ZESTRIL     TAKE these medications   apixaban 5 MG Tabs tablet Commonly known as:  ELIQUIS Take 1 tablet (5 mg total) by mouth 2 (two) times daily.   aspirin EC 81 MG tablet Take 81 mg by mouth every morning.   ciprofloxacin 500 MG tablet Commonly known as:  CIPRO Take 1 tablet (500 mg total) by mouth 2 (two) times daily.   furosemide 20 MG tablet Commonly known as:  LASIX Take 10 mg by mouth daily as needed for edema or fluid.   glipiZIDE 10 MG tablet Commonly known as:  GLUCOTROL Take 10 mg by mouth daily before breakfast.   hydrALAZINE 25 MG tablet Commonly known as:  APRESOLINE Take 25 mg by mouth every evening.   metFORMIN 1000 MG tablet Commonly known as:  GLUCOPHAGE Take 1,000 mg by mouth every evening.   simvastatin 40 MG tablet Commonly known as:  ZOCOR Take 40 mg by mouth every evening.   verapamil 240 MG CR tablet Commonly known as:  CALAN-SR Take 240 mg by mouth at bedtime.      Follow-up Information    WAKEFIELD,MATTHEW,  MD. Daphane Shepherd on 11/19/2016.   Specialty:  General Surgery Why:  9:10AM appointment, please arrive 30 minutes prior to appointment to fillout paperwork Contact information: [address] [place] [address] [phone]          No Known Allergies  Consultations:  IR   Surgery - [facility] Dr Donne Hazel   ID - Dr Johnnye Sima  [facility]    Procedures/Studies: Ct Abdomen Pelvis Wo Contrast  Result Date: 10/22/2016 CLINICAL DATA:  75 y/o M; right-sided abdominal pain after percutaneous cholecystostomy. EXAM: CT ABDOMEN AND PELVIS WITHOUT CONTRAST TECHNIQUE: Multidetector CT imaging of the abdomen and pelvis was performed following the standard protocol without IV contrast. COMPARISON:  10/17/2016 abdominal ultrasound. 10/16/2016 abdominal MRI. 10/13/2016 CT abdomen. FINDINGS: Lower chest: Small right greater than left pleural effusions with dependent opacities likely representing atelectasis.  Hepatobiliary: No focal liver abnormality. There is a cholecystostomy tube with pigtail in the upper gallbladder fundus. The gallbladder is markedly enlarged small foci of air in a large high attenuation collection likely representing a hematoma. Pancreas: Unremarkable. No pancreatic ductal dilatation or surrounding inflammatory changes. Spleen: Normal in size without focal abnormality. Adrenals/Urinary Tract: Adrenal glands are unremarkable. Stable left kidney interpolar and right kidney upper pole cysts. Kidneys are otherwise normal, without renal calculi, focal lesion, or hydronephrosis. Bladder is unremarkable. Stomach/Bowel: Stomach is within normal limits. Appendix appears normal. No evidence of bowel wall thickening, distention, or inflammatory changes. Vascular/Lymphatic: Aortic atherosclerosis. No enlarged abdominal or pelvic lymph nodes. Reproductive: Prostate is unremarkable. Other: Small volume of right hemi abdomen ascites. Musculoskeletal: Multilevel degenerative changes of the thoracic and lumbar spine and mild lumbar levocurvature. Mild bilateral hip osteoarthrosis. No acute osseous abnormality is evident. IMPRESSION: 1. Cholecystostomy tube within the gallbladder fundus. Gallbladder is markedly enlarged with large luminal hematoma. 2. Small volume of right hemi abdomen ascites is likely reactive. 3. Small bilateral pleural effusions with dependent atelectasis. 4. Aortic atherosclerosis. These results will be called to the ordering clinician or representative by the Radiologist Assistant, and communication documented in the PACS or zVision Dashboard. Electronically Signed   By: Kristine Garbe M.D.   On: 10/22/2016 17:07   Ct Abdomen Pelvis Wo Contrast  Result Date: 10/13/2016 CLINICAL DATA:  Nausea, vomiting, diarrhea for the last week, diffuse weakness EXAM: CT ABDOMEN AND PELVIS WITHOUT CONTRAST TECHNIQUE: Multidetector CT imaging of the abdomen and pelvis was performed following the  standard protocol without IV contrast. COMPARISON:  CT abdomen pelvis of 01/04/2012 FINDINGS: Lower chest: Mild linear atelectasis is noted at both lung bases. Coronary artery calcifications are present primarily in the distribution of the left anterior descending coronary artery. Hepatobiliary: The liver is unremarkable in the unenhanced state. The gallbladder however is distended and appears thick walled with faintly calcified gallstones. Mild strandiness is noted in the pericholecystic space, and these findings are worrisome for acute cholecystitis. Clinical correlation is recommended. Pancreas: The pancreas appears fatty infiltrated. The pancreatic duct is not dilated. Spleen: The spleen is unremarkable. Adrenals/Urinary Tract: No hydronephrosis is seen. No renal calculi are noted. There is a cyst emanating from the posterior left kidney of 2.7 cm with attenuation of 3 HU. The ureters are normal in caliber. The urinary bladder is moderately well distended with no abnormality noted. Stomach/Bowel: The stomach is only partially distended with oral contrast and food debris. No abnormality is seen. No small bowel distention is noted. The colon is largely decompressed. The terminal ileum and appendix are unremarkable. Vascular/Lymphatic: The abdominal  aorta is normal in caliber with moderate abdominal aortic atherosclerosis present. Only small retroperitoneal and mesenteric lymph nodes are present. Reproductive: The prostate gland is normal in size. Other: None Musculoskeletal: The lumbar vertebrae are in normal alignment. Diffuse degenerative disc disease is present throughout the lumbar spine. No compression deformity is seen. The SI joints are corticated with degenerative change noted. IMPRESSION: 1. Distended gallbladder with thickened wall and pericholecystic strandiness as well as gallstones. These findings are highly suspicious for acute cholecystitis and clinical correlation is recommended. 2. Moderate  abdominal aortic atherosclerosis. 3. Diffuse degenerative disc disease throughout the lumbar spine. 4. Coronary artery calcifications. 5. These results will be called to the ordering clinician or representative by the Radiologist Assistant, and communication documented in the PACS or zVision Dashboard. Electronically Signed   By: Ivar Drape M.D.   On: 10/13/2016 14:19   Dg Chest 2 View  Result Date: 10/12/2016 CLINICAL DATA:  Fluid buildup EXAM: CHEST  2 VIEW COMPARISON:  10/07/2016 FINDINGS: There is bilateral mild interstitial thickening. There is no focal parenchymal opacity. There is no pleural effusion or pneumothorax. The heart and mediastinal contours are unremarkable. The osseous structures are unremarkable. IMPRESSION: Mild interstitial thickening concerning for mild interstitial edema. Electronically Signed   By: Kathreen Devoid   On: 10/12/2016 16:37   Dg Chest 2 View  Result Date: 10/07/2016 CLINICAL DATA:  Worsening weakness over the past week. History of hypertension and diabetes. Nonsmoker. EXAM: CHEST  2 VIEW COMPARISON:  01/04/2012 FINDINGS: The lung volumes are low accounting for crowding of interstitial lung markings and right basilar atelectasis. Slight elevation of the right hemidiaphragm relative to left. The aorta is slightly uncoiled in appearance without definite aneurysm. No pneumonic consolidation, CHF nor effusion. No suspicious osseous lesions. 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 IMPRESSION: Low lung volumes with crowding of interstitial lung markings and right basilar atelectasis. No acute cardiopulmonary disease. Electronically Signed   By: Ashley Royalty M.D.   On: 10/07/2016 22:24   Nm Hepatobiliary Liver Func  Result Date: 10/18/2016 CLINICAL DATA:  Gallstones.  Fever. EXAM: NUCLEAR MEDICINE HEPATOBILIARY IMAGING TECHNIQUE: Sequential images of the abdomen were obtained out to 60 minutes following intravenous administration of radiopharmaceutical.  RADIOPHARMACEUTICALS:  5.2 mCi Tc-74m  Choletec IV COMPARISON:  Ultrasound 10/17/2016.  MRI 10/16/2016 FINDINGS: Prompt uptake and biliary excretion of activity by the liver is seen. Large photopenic defect related to distended gallbladder. No filling of the gallbladder. Small bowel activity is identified beginning at 15 minutes. The patient received 3.0 mg IV morphine at 1 hour. Additional scanning for 30 minutes demonstrates no gallbladder activity. IMPRESSION: Markedly distended gallbladder. No gallbladder uptake is seen the 4 after IV morphine administration. Findings consistent with cystic duct obstruction. Electronically Signed   By: Franchot Gallo M.D.   On: 10/18/2016 10:14   Ir Catheter Tube Change  Result Date: 10/25/2016 CLINICAL DATA:  Hemorrhage post percutaneous cholecystostomy catheter placement. CT shows clot distending the gallbladder. Poor output from previously placed 10 French catheter. EXAM: PERCUTANEOUS CHOLECYSTOSTOMY  CATHETER EXCHANGE UNDER FLUOROSCOPY FLUOROSCOPY TIME:  0.7 minute  (126  uGym2 DAP) TECHNIQUE: The procedure, risks (including but not limited to bleeding, infection, organ damage ), benefits, and alternatives were explained to the patient. Questions regarding the procedure were encouraged and answered. The patient understands and consents to the procedure. The previously placed cholecystostomy catheter and surrounding skin were prepped with chlorhexidine, draped in usual sterile fashion. A small amount of contrast was injected through the catheter to  partially opacify the gallbladder. The catheter was cut and exchanged over a 0.035" short Amplatz wire for a 14French vascular dilator, facilitating placement of a 14 French pigtail catheter, formed centrally within the gallbladder under fluoroscopy. Contrast injection confirms appropriate positioning. Catheter was secured externally with a Statlock device in 0 Prolene sutures. The patient tolerated the procedure well.  COMPLICATIONS: None immediate IMPRESSION: 1. Technically successful exchange and up sizing of percutaneous cholecystostomy catheter under fluoroscopy Electronically Signed   By: Lucrezia Europe M.D.   On: 10/25/2016 18:11   US Renal  Result Date: 10/08/2016 CLINICAL DATA:  Acute renal failure. EXAM: RENAL / URINARY TRACT ULTRASOUND COMPLETE COMPARISON:  CT abdomen and pelvis 01/04/2012. FINDINGS: Right Kidney: Length: 11.2 cm. Echogenicity within normal limits. No mass or hydronephrosis visualized. Incidental note is made of sludge and tiny stones within the gallbladder. No evidence of cholecystitis is present on study. Left Kidney: Length: 10.0 cm. Echogenicity within normal limits. No hydronephrosis visualized. Cyst in the midpole measures 3.5 cm in diameter. There may be a single thin septation the cyst. Bladder: Appears normal for degree of bladder distention. IMPRESSION: No acute abnormality.  Negative for hydronephrosis. Incidental note is made of sludge and small stones in the gallbladder. Finding is present on the prior CT. Electronically Signed   By: Inge Rise M.D.   On: 10/08/2016 10:00   Mr Abdomen Mrcp Wo Contrast  Result Date: 10/16/2016 CLINICAL DATA:  Patient with history of cholecystitis. Evaluate for choledocholithiasis. EXAM: MRI ABDOMEN WITHOUT CONTRAST  (INCLUDING MRCP) TECHNIQUE: Multiplanar multisequence MR imaging of the abdomen was performed. Heavily T2-weighted images of the biliary and pancreatic ducts were obtained, and three-dimensional MRCP images were rendered by post processing. COMPARISON:  CT abdomen pelvis 10/13/2016. FINDINGS: Lower chest: Dependent atelectasis within the lower lobes bilaterally. Hepatobiliary: Liver is normal in size and contour. The gallbladder is distended and contains multiple stones and sludge. There is gallbladder wall thickening and a small amount of pericholecystic fluid. Evaluation of the common bile duct is limited due to motion artifact.  Common bile duct is normal in diameter measuring 6 mm without definite intraluminal filling defect identified. No intrahepatic biliary ductal dilatation. Pancreas:  Mildly atrophic. Spleen:  Unremarkable Adrenals/Urinary Tract: The adrenal glands are normal. There is a 2.7 cm cyst within the interpolar region of the left kidney. Small 1.5 cm T2 bright structure within the superior pole of the right kidney, favored to represent a small cyst. Stomach/Bowel: No abnormal bowel wall thickening or evidence for bowel obstruction. Vascular/Lymphatic: Normal caliber abdominal aorta. No retroperitoneal lymphadenopathy. Other:  None. Musculoskeletal: Unremarkable. IMPRESSION: Stones and sludge within the gallbladder lumen. There is gallbladder wall thickening, distention of the gallbladder and mild surrounding fluid, raising the possibility of acute cholecystitis. Evaluation of the common bile duct is limited secondary to motion artifact however the common bile duct is normal in caliber without definite intraluminal filling defect to suggest choledocholithiasis. Electronically Signed   By: Lovey Newcomer M.D.   On: 10/16/2016 20:38   Mr 3d Recon At Scanner  Result Date: 10/16/2016 CLINICAL DATA:  Patient with history of cholecystitis. Evaluate for choledocholithiasis. EXAM: MRI ABDOMEN WITHOUT CONTRAST  (INCLUDING MRCP) TECHNIQUE: Multiplanar multisequence MR imaging of the abdomen was performed. Heavily T2-weighted images of the biliary and pancreatic ducts were obtained, and three-dimensional MRCP images were rendered by post processing. COMPARISON:  CT abdomen pelvis 10/13/2016. FINDINGS: Lower chest: Dependent atelectasis within the lower lobes bilaterally. Hepatobiliary: Liver is normal in size and contour. The  gallbladder is distended and contains multiple stones and sludge. There is gallbladder wall thickening and a small amount of pericholecystic fluid. Evaluation of the common bile duct is limited due to motion  artifact. Common bile duct is normal in diameter measuring 6 mm without definite intraluminal filling defect identified. No intrahepatic biliary ductal dilatation. Pancreas:  Mildly atrophic. Spleen:  Unremarkable Adrenals/Urinary Tract: The adrenal glands are normal. There is a 2.7 cm cyst within the interpolar region of the left kidney. Small 1.5 cm T2 bright structure within the superior pole of the right kidney, favored to represent a small cyst. Stomach/Bowel: No abnormal bowel wall thickening or evidence for bowel obstruction. Vascular/Lymphatic: Normal caliber abdominal aorta. No retroperitoneal lymphadenopathy. Other:  None. Musculoskeletal: Unremarkable. IMPRESSION: Stones and sludge within the gallbladder lumen. There is gallbladder wall thickening, distention of the gallbladder and mild surrounding fluid, raising the possibility of acute cholecystitis. Evaluation of the common bile duct is limited secondary to motion artifact however the common bile duct is normal in caliber without definite intraluminal filling defect to suggest choledocholithiasis. Electronically Signed   By: Lovey Newcomer M.D.   On: 10/16/2016 20:38   Ir Perc Cholecystostomy  Result Date: 10/19/2016 CLINICAL DATA:  Cholecystitis and need for percutaneous cholecystostomy due to poor candidacy for immediate cholecystectomy currently. EXAM: PERCUTANEOUS CHOLECYSTOSTOMY COMPARISON:  CT of the abdomen on 10/13/2016 and ultrasound on 10/17/2016. ANESTHESIA/SEDATION: 2.0 mg IV Versed; 100 mcg IV Fentanyl. Total Moderate Sedation Time 13 minutes. The patient's level of consciousness and physiologic status were continuously monitored during the procedure by Radiology nursing. CONTRAST:  10 mL Isovue-300 MEDICATIONS: No additional medications. FLUOROSCOPY TIME:  1 minutes and 12 seconds.  13 mGy. PROCEDURE: The procedure, risks, benefits, and alternatives were explained to the patient. Questions regarding the procedure were encouraged and  answered. The patient understands and consents to the procedure. A time-out was performed prior to initiating the procedure. The right abdominal wall was prepped with chlorhexidine in a sterile fashion, and a sterile drape was applied covering the operative field. A sterile gown and sterile gloves were used for the procedure. Local anesthesia was provided with 1% Lidocaine. Ultrasound image documentation was performed. Fluoroscopy during the procedure and fluoro spot radiograph confirms appropriate catheter position. Ultrasound was utilized to localize the gallbladder. Under direct ultrasound guidance, a 21 gauge needle was advanced via a transhepatic approach into the gallbladder lumen. Aspiration was performed and a bile sample sent for culture studies. A small amount of diluted contrast material was injected. A guide wire was then advanced into the gallbladder. A transitional dilator was placed. Percutaneous tract dilatation was then performed over a guide wire to 10-French. A 10-French pigtail drainage catheter was then advanced into the gallbladder lumen under fluoroscopy. Catheter was formed and injected with contrast material to confirm position. The catheter was flushed and connected to a gravity drainage bag. It was secured at the skin with a Prolene retention suture and Stat-Lock device. COMPLICATIONS: None FINDINGS: After needle puncture of the gallbladder, a bile sample was aspirated and sent for culture. The cholecystostomy tube was advanced into the gallbladder lumen and formed. It is now draining bile. This tube will be left to gravity drainage. IMPRESSION: Percutaneous cholecystostomy with placement of 10-French drainage catheter into the gallbladder lumen. This was left to gravity drainage. Electronically Signed   By: [name] M.D.   On: 10/19/2016 17:11   Dg Chest Port 1 View  Result Date: 10/21/2016 CLINICAL DATA:  Fever.  Recent gallbladder surgery .  EXAM: PORTABLE CHEST 1 VIEW  COMPARISON:  10/16/2016 . FINDINGS: Heart size stable. Low lung volumes with basilar atelectasis again noted. No pleural effusion or pneumothorax. IMPRESSION: Low lung volumes with basilar atelectasis again noted. Electronically Signed   By: Marcello Moores  Register   On: 10/21/2016 07:34   Dg Chest Port 1 View  Result Date: 10/16/2016 CLINICAL DATA:  Dyspnea EXAM: PORTABLE CHEST 1 VIEW COMPARISON:  10/12/2016 FINDINGS: Shallow inspiration with linear atelectasis in the lung bases. Heart size and pulmonary vascularity are normal. No focal consolidation in the lungs. No blunting of costophrenic angles. No pneumothorax. IMPRESSION: Shallow inspiration with atelectasis in the lung bases. Electronically Signed   By: Lucienne Capers M.D.   On: 10/16/2016 22:58   US Abdomen Limited Ruq  Result Date: 10/17/2016 CLINICAL DATA:  MRCP showed gallstones. EXAM: US ABDOMEN LIMITED - RIGHT UPPER QUADRANT COMPARISON:  MRCP dated 10/16/2016 FINDINGS: Gallbladder: Multiple gallstones are scattered throughout the gallbladder measuring up to 2.1 cm. There is also sludge filling the rest of the gallbladder. The gallbladder measures 7 mm and wall thickness. Sonographic Murphy's sign absent. Trace pericholecystic fluid is suggested. Common bile duct: Diameter: 5 mm Liver: There is several foci of apparent comet tail artifact or ring down artifact in the liver for example on image 48. No focal liver mass identified. IMPRESSION: 1. Large gallstones in the gallbladder with sludge and gallbladder wall thickening. Sonographic Murphy's sign absent. Correlate clinically in assessing for acute cholecystitis. 2. Foci of comet tail or ring down artifact in the liver. These can be encountered in the setting of pneumobilia, small cysts, or biliary hamartoma is. Confusing this picture is the absence of hepatic calcifications or obvious biliary hamartoma/cysts or pneumobilia on the prior CT and MRI. Electronically Signed   By: Van Clines  M.D.   On: 10/17/2016 17:59   ECHO 10/14/16 ------------------------------------------------------------------- Study Conclusions  - Left ventricle: The cavity size was normal. Wall thickness was   increased in a pattern of mild LVH. Systolic function was   vigorous. The estimated ejection fraction was in the range of 65%   to 70%. Wall motion was normal; there were no regional wall   motion abnormalities. Doppler parameters are consistent with   abnormal left ventricular relaxation (grade 1 diastolic   dysfunction). - Aortic valve: Mildly calcified annulus. Trileaflet. - Mitral valve: There was trivial regurgitation. - Left atrium: The atrium was mildly dilated. - Right atrium: Central venous pressure (est): 3 mm Hg. - Atrial septum: No defect or patent foramen ovale was identified. - Tricuspid valve: There was mild regurgitation. - Pulmonary arteries: PA peak pressure: 35 mm Hg (S). - Pericardium, extracardiac: There was no pericardial effusion.  Impressions:  - Mild LVH with sigmoid-shaped basal septum and LVEF 65-70%. Grade   1 diastolic dysfunction. Mild left atrial enlargement. Trivial   mitral regurgitation. Mild calcified aortic annulus. Mild   tricuspid regurgitation with PASP 35 mmHg.    Discharge Exam: Vitals:   10/26/16 0550 10/26/16 1200  BP: 133/70 137/74  Pulse: (!) 102 (!) 102  Resp: 18 16  Temp: 99.1 F (37.3 C) 98.8 F (37.1 C)   Vitals:   10/25/16 1539 10/25/16 2144 10/26/16 0550 10/26/16 1200  BP: (!) 141/67 133/89 133/70 137/74  Pulse: (!) 102 (!) 105 (!) 102 (!) 102  Resp: 16 17 18 16   Temp: 99.5 F (37.5 C) 98.5 F (36.9 C) 99.1 F (37.3 C) 98.8 F (37.1 C)  TempSrc: Oral Oral Oral Oral  SpO2:  93% 93% 100% 94%  Weight:      Height:        General: Pt is alert, awake, not in acute distress Cardiovascular: RRR, S1/S2 +, no rubs, no gallops Respiratory: CTA bilaterally, no wheezing, no rhonchi Abdominal:  Tender at the site of drain,  Drain in place clean. Soft, ND, bowel sounds + Extremities: 1+ Edema, no cyanosis   The results of significant diagnostics from this hospitalization (including imaging, microbiology, ancillary and laboratory) are listed below for reference.     Microbiology: Recent Results (from the past 240 hour(s))  Culture, blood (routine x 2)     Status: None   Collection Time: 10/16/16 10:16 PM  Result Value Ref Range Status   Specimen Description BLOOD LEFT HAND  Final   Special Requests BOTTLES DRAWN AEROBIC AND ANAEROBIC 5ML  Final   Culture NO GROWTH 5 DAYS  Final   Report Status 10/22/2016 FINAL  Final  Culture, blood (routine x 2)     Status: Abnormal   Collection Time: 10/16/16 10:25 PM  Result Value Ref Range Status   Specimen Description BLOOD RIGHT HAND  Final   Special Requests IN PEDIATRIC BOTTLE 3ML  Final   Culture  Setup Time   Final    GRAM NEGATIVE RODS IN PEDIATRIC BOTTLE CRITICAL RESULT CALLED TO, READ BACK BY AND VERIFIED WITH: C BALL,PHARMD AT 1405 10/17/16 BY L BENFIELD    Culture ESCHERICHIA COLI (A)  Final   Report Status 10/19/2016 FINAL  Final   Organism ID, Bacteria ESCHERICHIA COLI  Final      Susceptibility   Escherichia coli - MIC*    AMPICILLIN <=2 SENSITIVE Sensitive     CEFAZOLIN <=4 SENSITIVE Sensitive     CEFEPIME <=1 SENSITIVE Sensitive     CEFTAZIDIME <=1 SENSITIVE Sensitive     CEFTRIAXONE <=1 SENSITIVE Sensitive     CIPROFLOXACIN <=0.25 SENSITIVE Sensitive     GENTAMICIN <=1 SENSITIVE Sensitive     IMIPENEM <=0.25 SENSITIVE Sensitive     TRIMETH/SULFA <=20 SENSITIVE Sensitive     AMPICILLIN/SULBACTAM <=2 SENSITIVE Sensitive     PIP/TAZO <=4 SENSITIVE Sensitive     Extended ESBL NEGATIVE Sensitive     * ESCHERICHIA COLI  Blood Culture ID Panel (Reflexed)     Status: Abnormal   Collection Time: 10/16/16 10:25 PM  Result Value Ref Range Status   Enterococcus species NOT DETECTED NOT DETECTED Final   Listeria monocytogenes NOT DETECTED NOT  DETECTED Final   Staphylococcus species NOT DETECTED NOT DETECTED Final   Staphylococcus aureus NOT DETECTED NOT DETECTED Final   Streptococcus species NOT DETECTED NOT DETECTED Final   Streptococcus agalactiae NOT DETECTED NOT DETECTED Final   Streptococcus pneumoniae NOT DETECTED NOT DETECTED Final   Streptococcus pyogenes NOT DETECTED NOT DETECTED Final   Acinetobacter baumannii NOT DETECTED NOT DETECTED Final   Enterobacteriaceae species DETECTED (A) NOT DETECTED Final    Comment: CRITICAL RESULT CALLED TO, READ BACK BY AND VERIFIED WITH: C BALL,PHARMD AT 1405 10/17/16 BY L BENFIELD    Enterobacter cloacae complex NOT DETECTED NOT DETECTED Final   Escherichia coli DETECTED (A) NOT DETECTED Final    Comment: CRITICAL RESULT CALLED TO, READ BACK BY AND VERIFIED WITH: C BALL,PHARMD AT 1405 10/17/16 BY L BENFIELD    Klebsiella oxytoca NOT DETECTED NOT DETECTED Final   Klebsiella pneumoniae NOT DETECTED NOT DETECTED Final   Proteus species NOT DETECTED NOT DETECTED Final   Serratia marcescens NOT DETECTED NOT DETECTED Final  Carbapenem resistance NOT DETECTED NOT DETECTED Final   Haemophilus influenzae NOT DETECTED NOT DETECTED Final   Neisseria meningitidis NOT DETECTED NOT DETECTED Final   Pseudomonas aeruginosa NOT DETECTED NOT DETECTED Final   Candida albicans NOT DETECTED NOT DETECTED Final   Candida glabrata NOT DETECTED NOT DETECTED Final   Candida krusei NOT DETECTED NOT DETECTED Final   Candida parapsilosis NOT DETECTED NOT DETECTED Final   Candida tropicalis NOT DETECTED NOT DETECTED Final  Aerobic/Anaerobic Culture (surgical/deep wound)     Status: None   Collection Time: 10/19/16  4:28 PM  Result Value Ref Range Status   Specimen Description GALL BLADDER  Final   Special Requests Normal  Final   Gram Stain   Final    ABUNDANT WBC PRESENT,BOTH PMN AND MONONUCLEAR NO ORGANISMS SEEN    Culture FEW ESCHERICHIA COLI NO ANAEROBES ISOLATED   Final   Report Status  10/24/2016 FINAL  Final   Organism ID, Bacteria ESCHERICHIA COLI  Final      Susceptibility   Escherichia coli - MIC*    AMPICILLIN <=2 SENSITIVE Sensitive     CEFAZOLIN <=4 SENSITIVE Sensitive     CEFEPIME <=1 SENSITIVE Sensitive     CEFTAZIDIME <=1 SENSITIVE Sensitive     CEFTRIAXONE <=1 SENSITIVE Sensitive     CIPROFLOXACIN <=0.25 SENSITIVE Sensitive     GENTAMICIN <=1 SENSITIVE Sensitive     IMIPENEM <=0.25 SENSITIVE Sensitive     TRIMETH/SULFA <=20 SENSITIVE Sensitive     AMPICILLIN/SULBACTAM <=2 SENSITIVE Sensitive     PIP/TAZO <=4 SENSITIVE Sensitive     Extended ESBL NEGATIVE Sensitive     * FEW ESCHERICHIA COLI  Culture, blood (Routine X 2) w Reflex to ID Panel     Status: None   Collection Time: 10/20/16  6:50 PM  Result Value Ref Range Status   Specimen Description BLOOD LEFT HAND  Final   Special Requests BLOOD AEROBIC BOTTLE 10 CC, BANA 5 CC  Final   Culture NO GROWTH 5 DAYS  Final   Report Status 10/25/2016 FINAL  Final  Culture, blood (Routine X 2) w Reflex to ID Panel     Status: None   Collection Time: 10/20/16  6:58 PM  Result Value Ref Range Status   Specimen Description BLOOD RIGHT HAND  Final   Special Requests BLOOD AEROBIC BOTTLE 5 CC  Final   Culture NO GROWTH 5 DAYS  Final   Report Status 10/25/2016 FINAL  Final  Culture, Urine     Status: None   Collection Time: 10/22/16  6:49 PM  Result Value Ref Range Status   Specimen Description URINE, CATHETERIZED  Final   Special Requests NONE  Final   Culture NO GROWTH  Final   Report Status 10/24/2016 FINAL  Final     Labs: BNP (last 3 results)  Recent Labs  10/12/16 1707  BNP [id number]   Basic Metabolic Panel:  Recent Labs Lab 10/22/16 0426 10/23/16 0436 10/24/16 0518 10/25/16 0637 10/26/16 0353  NA 135 135 133* 135 135  K 3.8 3.8 3.8 3.7 4.8  CL 96* 95* 95* 99* 99*  CO2 27 27 27 26 26   GLUCOSE 230* 207* 121* 152* 161*  BUN 41* 36* 27* 20 18  CREATININE 2.35* 1.81* 1.49* 1.33* 1.24  CALCIUM  7.9* 7.8* 7.7* 7.8* 8.0*   Liver Function Tests:  Recent Labs Lab 10/20/16 0548 10/21/16 0603 10/25/16 0637  AST 69* 62* 61*  ALT 83* 72* 46  ALKPHOS 305* 257* 203*  BILITOT 1.3* 1.2 0.6  PROT 6.2* 6.6 5.5*  ALBUMIN 1.6* 1.6* 1.5*   No results for input(s): LIPASE, AMYLASE in the last 168 hours. No results for input(s): AMMONIA in the last 168 hours. CBC:  Recent Labs Lab 10/22/16 0426 10/23/16 0436 10/24/16 0518 10/25/16 0637 10/25/16 1853 10/26/16 0353  WBC 19.9* 18.0* 17.2* 16.4*  --  16.1*  NEUTROABS  --  14.1*  --   --   --   --   HGB 7.9* 7.2* 7.2* 7.0* 8.6* 8.4*  HCT 24.7* 22.5* 22.5* 21.8* 26.1* 25.8*  MCV 88.8 88.2 88.2 88.6  --  86.6  PLT 462* 452* 462* 524*  --  611*   Cardiac Enzymes: No results for input(s): CKTOTAL, CKMB, CKMBINDEX, TROPONINI in the last 168 hours. BNP: Invalid input(s): POCBNP CBG:  Recent Labs Lab 10/25/16 1148 10/25/16 1718 10/25/16 2139 10/26/16 0757 10/26/16 1203  GLUCAP 255* 155* 154* 156* 230*   D-Dimer No results for input(s): DDIMER in the last 72 hours. Hgb A1c No results for input(s): HGBA1C in the last 72 hours. Lipid Profile No results for input(s): CHOL, HDL, LDLCALC, TRIG, CHOLHDL, LDLDIRECT in the last 72 hours. Thyroid function studies No results for input(s): TSH, T4TOTAL, T3FREE, THYROIDAB in the last 72 hours.  Invalid input(s): FREET3 Anemia work up No results for input(s): VITAMINB12, FOLATE, FERRITIN, TIBC, IRON, RETICCTPCT in the last 72 hours. Urinalysis    Component Value Date/Time   COLORURINE YELLOW 10/17/2016 2204   APPEARANCEUR CLOUDY (A) 10/17/2016 2204   LABSPEC 1.016 10/17/2016 2204   PHURINE 5.0 10/17/2016 2204   GLUCOSEU NEGATIVE 10/17/2016 2204   HGBUR MODERATE (A) 10/17/2016 2204   BILIRUBINUR MODERATE (A) 10/17/2016 2204   KETONESUR NEGATIVE 10/17/2016 2204   PROTEINUR 30 (A) 10/17/2016 2204   UROBILINOGEN 0.2 01/04/2012 1646   NITRITE NEGATIVE 10/17/2016 2204    LEUKOCYTESUR TRACE (A) 10/17/2016 2204   Sepsis Labs Invalid input(s): PROCALCITONIN,  WBC,  LACTICIDVEN Microbiology Recent Results (from the past 240 hour(s))  Culture, blood (routine x 2)     Status: None   Collection Time: 10/16/16 10:16 PM  Result Value Ref Range Status   Specimen Description BLOOD LEFT HAND  Final   Special Requests BOTTLES DRAWN AEROBIC AND ANAEROBIC 5ML  Final   Culture NO GROWTH 5 DAYS  Final   Report Status 10/22/2016 FINAL  Final  Culture, blood (routine x 2)     Status: Abnormal   Collection Time: 10/16/16 10:25 PM  Result Value Ref Range Status   Specimen Description BLOOD RIGHT HAND  Final   Special Requests IN PEDIATRIC BOTTLE 3ML  Final   Culture  Setup Time   Final    GRAM NEGATIVE RODS IN PEDIATRIC BOTTLE CRITICAL RESULT CALLED TO, READ BACK BY AND VERIFIED WITH: C BALL,PHARMD AT [id number] 10/17/16 BY L BENFIELD    Culture ESCHERICHIA COLI (A)  Final   Report Status 10/19/2016 FINAL  Final   Organism ID, Bacteria ESCHERICHIA COLI  Final      Susceptibility   Escherichia coli - MIC*    AMPICILLIN <=2 SENSITIVE Sensitive     CEFAZOLIN <=4 SENSITIVE Sensitive     CEFEPIME <=1 SENSITIVE Sensitive     CEFTAZIDIME <=1 SENSITIVE Sensitive     CEFTRIAXONE <=1 SENSITIVE Sensitive     CIPROFLOXACIN <=0.25 SENSITIVE Sensitive     GENTAMICIN <=1 SENSITIVE Sensitive     IMIPENEM <=0.25 SENSITIVE Sensitive     TRIMETH/SULFA <=20 SENSITIVE Sensitive  AMPICILLIN/SULBACTAM <=2 SENSITIVE Sensitive     PIP/TAZO <=4 SENSITIVE Sensitive     Extended ESBL NEGATIVE Sensitive     * ESCHERICHIA COLI  Blood Culture ID Panel (Reflexed)     Status: Abnormal   Collection Time: 10/16/16 10:25 PM  Result Value Ref Range Status   Enterococcus species NOT DETECTED NOT DETECTED Final   Listeria monocytogenes NOT DETECTED NOT DETECTED Final   Staphylococcus species NOT DETECTED NOT DETECTED Final   Staphylococcus aureus NOT DETECTED NOT DETECTED Final   Streptococcus  species NOT DETECTED NOT DETECTED Final   Streptococcus agalactiae NOT DETECTED NOT DETECTED Final   Streptococcus pneumoniae NOT DETECTED NOT DETECTED Final   Streptococcus pyogenes NOT DETECTED NOT DETECTED Final   Acinetobacter baumannii NOT DETECTED NOT DETECTED Final   Enterobacteriaceae species DETECTED (A) NOT DETECTED Final    Comment: CRITICAL RESULT CALLED TO, READ BACK BY AND VERIFIED WITH: C BALL,PHARMD AT 1405 10/17/16 BY L BENFIELD    Enterobacter cloacae complex NOT DETECTED NOT DETECTED Final   Escherichia coli DETECTED (A) NOT DETECTED Final    Comment: CRITICAL RESULT CALLED TO, READ BACK BY AND VERIFIED WITH: C BALL,PHARMD AT 1405 10/17/16 BY L BENFIELD    Klebsiella oxytoca NOT DETECTED NOT DETECTED Final   Klebsiella pneumoniae NOT DETECTED NOT DETECTED Final   Proteus species NOT DETECTED NOT DETECTED Final   Serratia marcescens NOT DETECTED NOT DETECTED Final   Carbapenem resistance NOT DETECTED NOT DETECTED Final   Haemophilus influenzae NOT DETECTED NOT DETECTED Final   Neisseria meningitidis NOT DETECTED NOT DETECTED Final   Pseudomonas aeruginosa NOT DETECTED NOT DETECTED Final   Candida albicans NOT DETECTED NOT DETECTED Final   Candida glabrata NOT DETECTED NOT DETECTED Final   Candida krusei NOT DETECTED NOT DETECTED Final   Candida parapsilosis NOT DETECTED NOT DETECTED Final   Candida tropicalis NOT DETECTED NOT DETECTED Final  Aerobic/Anaerobic Culture (surgical/deep wound)     Status: None   Collection Time: 10/19/16  4:28 PM  Result Value Ref Range Status   Specimen Description GALL BLADDER  Final   Special Requests Normal  Final   Gram Stain   Final    ABUNDANT WBC PRESENT,BOTH PMN AND MONONUCLEAR NO ORGANISMS SEEN    Culture FEW ESCHERICHIA COLI NO ANAEROBES ISOLATED   Final   Report Status 10/24/2016 FINAL  Final   Organism ID, Bacteria ESCHERICHIA COLI  Final      Susceptibility   Escherichia coli - MIC*    AMPICILLIN <=2 SENSITIVE  Sensitive     CEFAZOLIN <=4 SENSITIVE Sensitive     CEFEPIME <=1 SENSITIVE Sensitive     CEFTAZIDIME <=1 SENSITIVE Sensitive     CEFTRIAXONE <=1 SENSITIVE Sensitive     CIPROFLOXACIN <=0.25 SENSITIVE Sensitive     GENTAMICIN <=1 SENSITIVE Sensitive     IMIPENEM <=0.25 SENSITIVE Sensitive     TRIMETH/SULFA <=20 SENSITIVE Sensitive     AMPICILLIN/SULBACTAM <=2 SENSITIVE Sensitive     PIP/TAZO <=4 SENSITIVE Sensitive     Extended ESBL NEGATIVE Sensitive     * FEW ESCHERICHIA COLI  Culture, blood (Routine X 2) w Reflex to ID Panel     Status: None   Collection Time: 10/20/16  6:50 PM  Result Value Ref Range Status   Specimen Description BLOOD LEFT HAND  Final   Special Requests BLOOD AEROBIC BOTTLE 10 CC, BANA 5 CC  Final   Culture NO GROWTH 5 DAYS  Final   Report Status 10/25/2016 FINAL  Final  Culture, blood (Routine X 2) w Reflex to ID Panel     Status: None   Collection Time: 10/20/16  6:58 PM  Result Value Ref Range Status   Specimen Description BLOOD RIGHT HAND  Final   Special Requests BLOOD AEROBIC BOTTLE 5 CC  Final   Culture NO GROWTH 5 DAYS  Final   Report Status 10/25/2016 FINAL  Final  Culture, Urine     Status: None   Collection Time: 10/22/16  6:49 PM  Result Value Ref Range Status   Specimen Description URINE, CATHETERIZED  Final   Special Requests NONE  Final   Culture NO GROWTH  Final   Report Status 10/24/2016 FINAL  Final    Time coordinating discharge: Over 30 minutes  SIGNED:  Chipper Oman, MD  [facility] 10/26/2016, 1:07 PM Pager   If 7PM-7AM, please contact night-coverage [email or web address] Password [email or web address]

## 2016-10-26 NOTE — Care Management Note (Signed)
Case Management Note  Patient Details  Name: MANUS BERGTHOLD Sr. MRN: [id number] Date of Birth: 03-20-1941  Subjective/Objective:    Ecoli Bacteremia and Sepsis, Acute Cholecystitis with post percutaneous cholecystostomy with tubes               Action/Plan: Discharge Planning: AVS reviewed: Chart reviewed. Scheduled dc to SNF, CSW following for SNF placement.    Expected Discharge Date:  10/26/2016             Expected Discharge Plan:  [place]  In-House Referral:  Clinical Social Work  Discharge planning Services  CM Consult  Post Acute Care Choice:  NA Choice offered to:  NA  DME Arranged:  N/A DME Agency:  NA  HH Arranged:  NA HH Agency:  NA  Status of Service:  Completed, signed off  If discussed at [place] of Stay Meetings, dates discussed:  10/26/2016  Additional Comments:  Erenest Rasher, RN 10/26/2016, 2:18 PM

## 2016-10-26 NOTE — Progress Notes (Signed)
[facility] Progress Note     Subjective: Pt with no new complaints. Mild pain around drain site otherwise no abdominal pain. No nausea or vomiting.   Objective: Vital signs in last 24 hours: Temp:  [97.2 F (36.2 C)-100.4 F (38 C)] 99.1 F (37.3 C) (12/05 0550) Pulse Rate:  [100-105] 102 (12/05 0550) Resp:  [16-18] 18 (12/05 0550) BP: (133-141)/(67-89) 133/70 (12/05 0550) SpO2:  [93 %-100 %] 100 % (12/05 0550) Last BM Date: 10/23/16  Intake/Output from previous day: 12/04 0701 - 12/05 0700 In: 1020 [P.O.:720; Blood:300] Out: 1855 [Urine:1725; Drains:130] Intake/Output this shift: Total I/O In: 120 [P.O.:120] Out: 0   PE: Gen:  Alert, NAD, pleasant, lying in bed Card:  RRR, S1 and S2 normal Pulm:  CTA, no W/R/R Abd: Soft, mildly distended, +BS, drain with minimal sanguinous drainage and insertion dressed  Lab Results:   Recent Labs  10/25/16 0637 10/25/16 1853 10/26/16 0353  WBC 16.4*  --  16.1*  HGB 7.0* 8.6* 8.4*  HCT 21.8* 26.1* 25.8*  PLT 524*  --  611*   BMET  Recent Labs  10/25/16 0637 10/26/16 0353  NA 135 135  K 3.7 4.8  CL 99* 99*  CO2 26 26  GLUCOSE 152* 161*  BUN 20 18  CREATININE 1.33* 1.24  CALCIUM 7.8* 8.0*   PT/INR No results for input(s): LABPROT, INR in the last 72 hours. CMP     Component Value Date/Time   NA 135 10/26/2016 0353   K 4.8 10/26/2016 0353   CL 99 (L) 10/26/2016 0353   CO2 26 10/26/2016 0353   GLUCOSE 161 (H) 10/26/2016 0353   BUN 18 10/26/2016 0353   CREATININE 1.24 10/26/2016 0353   CALCIUM 8.0 (L) 10/26/2016 0353   PROT 5.5 (L) 10/25/2016 0637   ALBUMIN 1.5 (L) 10/25/2016 0637   AST 61 (H) 10/25/2016 0637   ALT 46 10/25/2016 0637   ALKPHOS 203 (H) 10/25/2016 0637   BILITOT 0.6 10/25/2016 0637   GFRNONAA 54 (L) 10/26/2016 0353   GFRAA >60 10/26/2016 0353   Lipase     Component Value Date/Time   LIPASE 36 10/07/2016 2153       Studies/Results: Ir Catheter Tube Change  Result  Date: 10/25/2016 CLINICAL DATA:  Hemorrhage post percutaneous cholecystostomy catheter placement. CT shows clot distending the gallbladder. Poor output from previously placed 10 French catheter. EXAM: PERCUTANEOUS CHOLECYSTOSTOMY  CATHETER EXCHANGE UNDER FLUOROSCOPY FLUOROSCOPY TIME:  0.7 minute  (126  uGym2 DAP) TECHNIQUE: The procedure, risks (including but not limited to bleeding, infection, organ damage ), benefits, and alternatives were explained to the patient. Questions regarding the procedure were encouraged and answered. The patient understands and consents to the procedure. The previously placed cholecystostomy catheter and surrounding skin were prepped with chlorhexidine, draped in usual sterile fashion. A small amount of contrast was injected through the catheter to partially opacify the gallbladder. The catheter was cut and exchanged over a 0.035" short Amplatz wire for a 14French vascular dilator, facilitating placement of a 14 French pigtail catheter, formed centrally within the gallbladder under fluoroscopy. Contrast injection confirms appropriate positioning. Catheter was secured externally with a Statlock device in 0 Prolene sutures. The patient tolerated the procedure well. COMPLICATIONS: None immediate IMPRESSION: 1. Technically successful exchange and up sizing of percutaneous cholecystostomy catheter under fluoroscopy Electronically Signed   By: Lucrezia Europe M.D.   On: 10/25/2016 18:11    Anti-infectives: Anti-infectives    Start     Dose/Rate Route Frequency Ordered  Stop   10/23/16 1400  metroNIDAZOLE (FLAGYL) tablet 500 mg     500 mg Oral Every 8 hours 10/23/16 1026     10/23/16 1254  ciprofloxacin (CIPRO) 500 MG tablet    Comments:  [name]   : cabinet override      10/23/16 1254 10/23/16 1300   10/23/16 1100  ciprofloxacin (CIPRO) tablet 500 mg     500 mg Oral 2 times daily 10/23/16 1047     10/23/16 1030  ciprofloxacin (CIPRO) tablet 500 mg  Status:  Discontinued     500  mg Oral Daily 10/23/16 1026 10/23/16 1047   10/21/16 2100  Ampicillin-Sulbactam (UNASYN) 3 g in sodium chloride 0.9 % 100 mL IVPB  Status:  Discontinued     3 g 200 mL/hr over 30 Minutes Intravenous Every 12 hours 10/21/16 1548 10/23/16 1026   10/19/16 0915  ertapenem (INVANZ) 1 g in sodium chloride 0.9 % 50 mL IVPB  Status:  Discontinued     1 g 100 mL/hr over 30 Minutes Intravenous Every 24 hours 10/19/16 0909 10/19/16 0910   10/17/16 0400  piperacillin-tazobactam (ZOSYN) IVPB 3.375 g  Status:  Discontinued     3.375 g 12.5 mL/hr over 240 Minutes Intravenous Every 8 hours 10/16/16 2122 10/21/16 1519   10/16/16 2200  piperacillin-tazobactam (ZOSYN) IVPB 3.375 g     3.375 g 100 mL/hr over 30 Minutes Intravenous  Once 10/16/16 2122 10/16/16 2226       Assessment/Plan  Acute cholecystitis, DVT S/P percutaneous choelcystostomy tube placement by IR 11/28 - CT 12/1 showed cholecystostomy tube in GB fundus, GB enlarged with large luminal hematoma - Cx: few E.Coli no anaerobes - WBC trending down - Will need surgical follow up with Dr. Rolm Bookbinder in 4 weeks as well as follow up per IR at the drain clinic for perc cholecystomy.  Fever  - no fever over night - abx switched to cipro/flagyl to rule out drug fever - urine culture no growth  ID- unasyn 11/30>>12/2, cipro 12/2>>, Flagyl 12/2>> >>> per ID consider 3 weeks of antibiotics if he continues to do well FEN - carb modified VTE - heparin  Plan: Continue to monitor. Likely d/c today to SNF. Continue abx per ID. Encourage IS/mobilization. F/u with Dr. Donne Hazel in 4 weeks.    LOS: 13 days    Kalman Drape , [facility] 10/26/2016, 8:26 AM Pager: [phone] Consults: [phone] Mon-Fri 7:00 am-4:30 pm Sat-Sun 7:00 am-11:30 am

## 2016-10-26 NOTE — Progress Notes (Signed)
[place] to be D/C'd Skilled nursing facility per MD order.  Discussed prescriptions and follow up appointments with the patient. Prescriptions given to patient, medication list explained in detail. Pt verbalized understanding.    Medication List    STOP taking these medications   cloNIDine 0.2 MG tablet Commonly known as:  CATAPRES   lisinopril 40 MG tablet Commonly known as:  PRINIVIL,ZESTRIL     TAKE these medications   apixaban 5 MG Tabs tablet Commonly known as:  ELIQUIS Take 1 tablet (5 mg total) by mouth 2 (two) times daily.   aspirin EC 81 MG tablet Take 81 mg by mouth every morning.   ciprofloxacin 500 MG tablet Commonly known as:  CIPRO Take 1 tablet (500 mg total) by mouth 2 (two) times daily.   furosemide 20 MG tablet Commonly known as:  LASIX Take 10 mg by mouth daily as needed for edema or fluid.   glipiZIDE 10 MG tablet Commonly known as:  GLUCOTROL Take 10 mg by mouth daily before breakfast.   hydrALAZINE 25 MG tablet Commonly known as:  APRESOLINE Take 25 mg by mouth every evening.   metFORMIN 1000 MG tablet Commonly known as:  GLUCOPHAGE Take 1,000 mg by mouth every evening.   simvastatin 40 MG tablet Commonly known as:  ZOCOR Take 40 mg by mouth every evening.   verapamil 240 MG CR tablet Commonly known as:  CALAN-SR Take 240 mg by mouth at bedtime.       Vitals:   10/26/16 0550 10/26/16 1200  BP: 133/70 137/74  Pulse: (!) 102 (!) 102  Resp: 18 16  Temp: 99.1 F (37.3 C) 98.8 F (37.1 C)    Skin clean, dry and intact without evidence of skin break down, no evidence of skin tears noted. IV catheter discontinued intact. Site without signs and symptoms of complications. Dressing and pressure applied. Pt denies pain at this time. No complaints noted.  An After Visit Summary was printed and given to the patient. Patient escorted via [place], and D/C to [place], RN [facility] Phone [phone]

## 2016-10-26 NOTE — Progress Notes (Signed)
Physical Therapy Treatment Patient Details Name: Stephen Stephen Sr. MRN: [id number] DOB: 1941/06/04 Today's Date: 10/26/2016    History of Present Illness  Stephen BYUN Sr. is a 75 y.o. male with medical history significant of HTN and DM type II; who presents with complaints of leg swelling and inability to walk.  S/P cholecystotomy tube.    PT Comments    Pt progressing slowly with standing activity.  Needs much encouragement.  Follow Up Recommendations  SNF     Equipment Recommendations  None recommended by PT    Recommendations for Other Services       Precautions / Restrictions Precautions Precautions: Fall    Mobility  Bed Mobility   Bed Mobility: Supine to Sit;Sit to Supine     Supine to sit: Max assist;+2 for safety/equipment Sit to supine: Mod assist;+2 for safety/equipment   General bed mobility comments: initially pt did not help to scoot with arms or trunk, but going back to bed, pt used his UE's to assist.  Transfers Overall transfer level: Needs assistance Equipment used: Rolling walker (2 wheeled) Transfers: Sit to/from Stand Sit to Stand: Max assist;+2 physical assistance         General transfer comment: needed 2 person assist to help lean forward and for lift assist  Ambulation/Gait   Ambulation Distance (Feet): 2 Feet (forward and back x2,) Assistive device: Rolling walker (2 wheeled)     Gait velocity interpretation: Below normal speed for age/gender General Gait Details: very effortful with pt having a difficult time advancing his feet. dut o unable to shift weight onto Left foot to advance right foot.   Stairs            Wheelchair Mobility    Modified Rankin (Stroke Patients Only)       Balance     Sitting balance-Leahy Scale: Fair     Standing balance support: Bilateral upper extremity supported Standing balance-Leahy Scale: Zero Standing balance comment: stood x3 working on standing/w/shift, stepping  forward and general standing tolerance.                    Cognition Arousal/Alertness: Awake/alert Behavior During Therapy: WFL for tasks assessed/performed Overall Cognitive Status: Impaired/Different from baseline                      Exercises      General Comments        Pertinent Vitals/Pain Pain Assessment: Faces Faces Pain Scale: Hurts little more Pain Descriptors / Indicators: Aching    Home Living                      Prior Function            PT Goals (current goals can now be found in the care plan section) Acute Rehab PT Goals PT Goal Formulation: With patient Time For Goal Achievement: 10/27/16 Potential to Achieve Goals: Fair Progress towards PT goals: Progressing toward goals    Frequency    Min 3X/week      PT Plan Current plan remains appropriate    Co-evaluation             End of Session   Activity Tolerance: Patient tolerated treatment well;Patient limited by fatigue Patient left: in bed;with call bell/phone within reach     Time: [id number] PT Time Calculation (min) (ACUTE ONLY): 22 min  Charges:  $Therapeutic Activity: 8-22 mins  G CodesTessie Fass Hatcher Froning 10/26/2016, 4:31 PM 10/26/2016  Donnella Sham, [place] [phone]  (pager)

## 2016-10-26 NOTE — Care Management Important Message (Signed)
Important Message  Patient Details  Name: Stephen LIVOLSI Sr. MRN: [id number] Date of Birth: [birth year]   Medicare Important Message Given:  Yes    Takia Runyon 10/26/2016, 10:55 AM

## 2016-10-26 NOTE — Progress Notes (Signed)
Report called to Supervising Nurse at [facility] in preparation for pt arrival.

## 2016-10-26 NOTE — Clinical Social Work Placement (Addendum)
   CLINICAL SOCIAL WORK PLACEMENT  NOTE 10/26/16 - DISCHARGED TO AVANTE AT [facility]  Date:  10/26/2016  Patient Details  Name: Stephen MANZA Sr. MRN: [id number] Date of Birth: 11/21/1941  Clinical Social Work is seeking post-discharge placement for this patient at the [place] level of care (*CSW will initial, date and re-position this form in  chart as items are completed):  Yes   Patient/family provided with [place] Work Department's list of facilities offering this level of care within the geographic area requested by the patient (or if unable, by the patient's family).  Yes   Patient/family informed of their freedom to choose among providers that offer the needed level of care, that participate in Medicare, Medicaid or managed care program needed by the patient, have an available bed and are willing to accept the patient.  Yes   Patient/family informed of [facility]'s ownership interest in [facility] and [facility], as well as of the fact that they are under no obligation to receive care at these facilities.  PASRR submitted to EDS on 10/17/16     PASRR number received on 10/17/16     Existing PASRR number confirmed on       FL2 transmitted to all facilities in geographic area requested by pt/family on 10/17/16     FL2 transmitted to all facilities within larger geographic area on 10/17/16     Patient informed that his/her managed care company has contracts with or will negotiate with certain facilities, including the following:         Yes - Patient/family informed of bed offers received.  Patient chooses bed at  [facility] recommends and patient chooses bed at      Patient to be transferred to  [facility] on  10/26/16.  Patient to be transferred to facility by  ambulance     Patient family notified on  10/26/16 of transfer.  Name of family member notified:   Daughter, Sherald Hess     PHYSICIAN       Additional Comment:  10/26/16 - Received insurance authorization from [place] - [id number], eff. 12/5.   _______________________________________________ Sable Feil, LCSW 10/26/2016, 2:27 PM

## 2016-10-26 NOTE — Progress Notes (Signed)
Referring Physician(s): Dr Patrecia Pour  Supervising Physician: [name]  Patient Status:  [facility] - In-pt  Chief Complaint:  Perc Chole drain placed 11/28  Subjective:  Output had been bloody  CT revealed large luminal hematoma after chole drainage Exchanged drain to 14 Fr 12/4 Now draining so much better Still bloody Pt has no complaints  Allergies: Patient has no known allergies.  Medications: Prior to Admission medications   Medication Sig Start Date End Date Taking? Authorizing Provider  aspirin EC 81 MG tablet Take 81 mg by mouth every morning.    Yes Historical Provider, MD  cloNIDine (CATAPRES) 0.2 MG tablet Take 0.2 mg by mouth daily.    Yes Historical Provider, MD  furosemide (LASIX) 20 MG tablet Take 10 mg by mouth daily as needed for edema or fluid. 08/14/16  Yes Historical Provider, MD  glipiZIDE (GLUCOTROL) 10 MG tablet Take 10 mg by mouth daily before breakfast.    Yes Historical Provider, MD  hydrALAZINE (APRESOLINE) 25 MG tablet Take 25 mg by mouth every evening.    Yes Historical Provider, MD  lisinopril (PRINIVIL,ZESTRIL) 40 MG tablet Take 20 mg by mouth every evening.    Yes Historical Provider, MD  metFORMIN (GLUCOPHAGE) 1000 MG tablet Take 1,000 mg by mouth every evening.    Yes Historical Provider, MD  simvastatin (ZOCOR) 40 MG tablet Take 40 mg by mouth every evening.    Yes Historical Provider, MD  verapamil (CALAN-SR) 240 MG CR tablet Take 240 mg by mouth at bedtime.   Yes Historical Provider, MD     Vital Signs: BP 133/70 (BP Location: Left Arm)   Pulse (!) 102   Temp 99.1 F (37.3 C) (Oral)   Resp 18   Ht 5\' 6"  (1.676 m)   Wt 206 lb 4.8 oz (93.6 kg)   SpO2 100%   BMI 33.30 kg/m   Physical Exam  Constitutional: He is oriented to person, place, and time.  Abdominal: Soft. Bowel sounds are normal. There is tenderness.  Mild tenderness at drain site  Neurological: He is alert and oriented to person, place, and time.  Skin: Skin is  warm and dry.  Site of drain is clean and dry Sl tender No infection Output bloody 130 cc yesterday.   Nursing note and vitals reviewed.  Hg 8.4 today  Imaging: Ct Abdomen Pelvis Wo Contrast  Result Date: 10/22/2016 CLINICAL DATA:  75 y/o M; right-sided abdominal pain after percutaneous cholecystostomy. EXAM: CT ABDOMEN AND PELVIS WITHOUT CONTRAST TECHNIQUE: Multidetector CT imaging of the abdomen and pelvis was performed following the standard protocol without IV contrast. COMPARISON:  10/17/2016 abdominal ultrasound. 10/16/2016 abdominal MRI. 10/13/2016 CT abdomen. FINDINGS: Lower chest: Small right greater than left pleural effusions with dependent opacities likely representing atelectasis. Hepatobiliary: No focal liver abnormality. There is a cholecystostomy tube with pigtail in the upper gallbladder fundus. The gallbladder is markedly enlarged small foci of air in a large high attenuation collection likely representing a hematoma. Pancreas: Unremarkable. No pancreatic ductal dilatation or surrounding inflammatory changes. Spleen: Normal in size without focal abnormality. Adrenals/Urinary Tract: Adrenal glands are unremarkable. Stable left kidney interpolar and right kidney upper pole cysts. Kidneys are otherwise normal, without renal calculi, focal lesion, or hydronephrosis. Bladder is unremarkable. Stomach/Bowel: Stomach is within normal limits. Appendix appears normal. No evidence of bowel wall thickening, distention, or inflammatory changes. Vascular/Lymphatic: Aortic atherosclerosis. No enlarged abdominal or pelvic lymph nodes. Reproductive: Prostate is unremarkable. Other: Small volume of right hemi abdomen ascites.  Musculoskeletal: Multilevel degenerative changes of the thoracic and lumbar spine and mild lumbar levocurvature. Mild bilateral hip osteoarthrosis. No acute osseous abnormality is evident. IMPRESSION: 1. Cholecystostomy tube within the gallbladder fundus. Gallbladder is markedly  enlarged with large luminal hematoma. 2. Small volume of right hemi abdomen ascites is likely reactive. 3. Small bilateral pleural effusions with dependent atelectasis. 4. Aortic atherosclerosis. These results will be called to the ordering clinician or representative by the Radiologist Assistant, and communication documented in the PACS or zVision Dashboard. Electronically Signed   By: Kristine Garbe M.D.   On: 10/22/2016 17:07   Ir Catheter Tube Change  Result Date: 10/25/2016 CLINICAL DATA:  Hemorrhage post percutaneous cholecystostomy catheter placement. CT shows clot distending the gallbladder. Poor output from previously placed 10 French catheter. EXAM: PERCUTANEOUS CHOLECYSTOSTOMY  CATHETER EXCHANGE UNDER FLUOROSCOPY FLUOROSCOPY TIME:  0.7 minute  (126  uGym2 DAP) TECHNIQUE: The procedure, risks (including but not limited to bleeding, infection, organ damage ), benefits, and alternatives were explained to the patient. Questions regarding the procedure were encouraged and answered. The patient understands and consents to the procedure. The previously placed cholecystostomy catheter and surrounding skin were prepped with chlorhexidine, draped in usual sterile fashion. A small amount of contrast was injected through the catheter to partially opacify the gallbladder. The catheter was cut and exchanged over a 0.035" short Amplatz wire for a 14French vascular dilator, facilitating placement of a 14 French pigtail catheter, formed centrally within the gallbladder under fluoroscopy. Contrast injection confirms appropriate positioning. Catheter was secured externally with a Statlock device in 0 Prolene sutures. The patient tolerated the procedure well. COMPLICATIONS: None immediate IMPRESSION: 1. Technically successful exchange and up sizing of percutaneous cholecystostomy catheter under fluoroscopy Electronically Signed   By: Lucrezia Europe M.D.   On: 10/25/2016 18:11    Labs:  CBC:  Recent Labs   10/23/16 0436 10/24/16 0518 10/25/16 0637 10/25/16 1853 10/26/16 0353  WBC 18.0* 17.2* 16.4*  --  16.1*  HGB 7.2* 7.2* 7.0* 8.6* 8.4*  HCT 22.5* 22.5* 21.8* 26.1* 25.8*  PLT 452* 462* 524*  --  611*    COAGS:  Recent Labs  10/13/16 1414  10/23/16 1353 10/24/16 0518 10/24/16 2003 10/25/16 0637  INR 1.16  --   --   --   --   --   APTT 27  < > 75* 66* 55* 68*  < > = values in this interval not displayed.  BMP:  Recent Labs  10/23/16 0436 10/24/16 0518 10/25/16 0637 10/26/16 0353  NA 135 133* 135 135  K 3.8 3.8 3.7 4.8  CL 95* 95* 99* 99*  CO2 27 27 26 26   GLUCOSE 207* 121* 152* 161*  BUN 36* 27* 20 18  CALCIUM 7.8* 7.7* 7.8* 8.0*  CREATININE 1.81* 1.49* 1.33* 1.24  GFRNONAA 34* 44* 50* 54*  GFRAA 40* 50* 58* >60    LIVER FUNCTION TESTS:  Recent Labs  10/18/16 0544 10/20/16 0548 10/21/16 0603 10/25/16 0637  BILITOT 2.1* 1.3* 1.2 0.6  AST 117* 69* 62* 61*  ALT 161* 83* 72* 46  ALKPHOS 466* 305* 257* 203*  PROT 6.3* 6.2* 6.6 5.5*  ALBUMIN 1.9* 1.6* 1.6* 1.5*    Assessment and Plan:  Perc chole drain intact Stays in for 6-8 weeks Plan per [facility]  Electronically Signed: Kamil Hanigan A 10/26/2016, 8:54 AM   I spent a total of 15 Minutes at the the patient's bedside AND on the patient's hospital floor or unit, greater than 50% of which  was counseling/coordinating care for perc chole drain

## 2016-10-28 DIAGNOSIS — A4151 Sepsis due to Escherichia coli [E. coli]: Secondary | ICD-10-CM | POA: Diagnosis not present

## 2016-10-28 DIAGNOSIS — K5900 Constipation, unspecified: Secondary | ICD-10-CM | POA: Diagnosis not present

## 2016-10-28 DIAGNOSIS — I82409 Acute embolism and thrombosis of unspecified deep veins of unspecified lower extremity: Secondary | ICD-10-CM | POA: Diagnosis not present

## 2016-10-28 DIAGNOSIS — K810 Acute cholecystitis: Secondary | ICD-10-CM | POA: Diagnosis not present

## 2016-10-29 LAB — TYPE AND SCREEN
ABO/RH(D): B POS
ANTIBODY SCREEN: NEGATIVE
UNIT DIVISION: 0
Unit division: 0
Unit division: 0

## 2016-11-03 ENCOUNTER — Other Ambulatory Visit: Payer: Self-pay | Admitting: General Surgery

## 2016-11-03 DIAGNOSIS — K810 Acute cholecystitis: Secondary | ICD-10-CM

## 2016-11-30 ENCOUNTER — Other Ambulatory Visit: Payer: Self-pay | Admitting: General Surgery

## 2016-11-30 ENCOUNTER — Ambulatory Visit
Admission: RE | Admit: 2016-11-30 | Discharge: 2016-11-30 | Disposition: A | Discharge status: Home / Self Care | Payer: Medicare HMO | Source: Ambulatory Visit | Attending: General Surgery | Admitting: General Surgery

## 2016-11-30 DIAGNOSIS — K8020 Calculus of gallbladder without cholecystitis without obstruction: Secondary | ICD-10-CM | POA: Diagnosis not present

## 2016-11-30 DIAGNOSIS — K810 Acute cholecystitis: Secondary | ICD-10-CM

## 2016-11-30 HISTORY — PX: IR GENERIC HISTORICAL: IMG1180011

## 2016-11-30 NOTE — Progress Notes (Signed)
Patient ID: Stephen Bales Sr., male   DOB: Sep 01, 1941, 76 y.o.   MRN: [id number]   Referring Physician(s): [facility]  Chief Complaint: The patient is seen in follow up today s/p placement of a percutaneous cholecystostomy drain on 10/19/16, upsized on 10/25/16 due to intraluminal hematoma.  History of present illness:  The patient had acute cholecystitis and had a percutaneous cholecystostomy drain placed at the request of Dr. Donne Hazel on 10/19/16.  The patient has recovered well over the last 6 weeks while at the nursing facility.  He has been draining at least 600cc of bilious output a day.  He does not know about his medications as he has been given then and not told what he is taking.  His discharge summary states that he should have taken 3 weeks of Cipro at the time of discharge.  He denies any pain or fevers.  He presents today for a cholangiogram to assess patency of cystic duct.  Past Medical History:  Diagnosis Date  . CKD (chronic kidney disease), stage III   . Diabetes mellitus   . DVT (deep venous thrombosis) ([place]) 10/14/2016  . Extremity edema 09/2016  . Hyperlipemia   . Hypertension     Past Surgical History:  Procedure Laterality Date  . COLONOSCOPY    . IR GENERIC HISTORICAL  10/19/2016   IR PERC CHOLECYSTOSTOMY 10/19/2016 [name], MD [facility]INTER[facility]  . IR GENERIC HISTORICAL  10/25/2016   IR CATHETER TUBE CHANGE 10/25/2016 [name], MD [facility]INTER[facility]    Allergies: Patient has no known allergies.  Medications: Prior to Admission medications   Medication Sig Start Date End Date Taking? Authorizing Provider  apixaban (ELIQUIS) 5 MG TABS tablet Take 1 tablet (5 mg total) by mouth 2 (two) times daily. 10/26/16   Doreatha Lew, MD  aspirin EC 81 MG tablet Take 81 mg by mouth every morning.     Historical Provider, MD  furosemide (LASIX) 20 MG tablet Take 10 mg by mouth daily as needed for edema or fluid. 08/14/16   Historical Provider, MD    glipiZIDE (GLUCOTROL) 10 MG tablet Take 10 mg by mouth daily before breakfast.     Historical Provider, MD  hydrALAZINE (APRESOLINE) 25 MG tablet Take 25 mg by mouth every evening.     Historical Provider, MD  metFORMIN (GLUCOPHAGE) 1000 MG tablet Take 1,000 mg by mouth every evening.     Historical Provider, MD  simvastatin (ZOCOR) 40 MG tablet Take 40 mg by mouth every evening.     Historical Provider, MD  verapamil (CALAN-SR) 240 MG CR tablet Take 240 mg by mouth at bedtime.    Historical Provider, MD     Family History  Problem Relation Age of Onset  . Heart attack Other   . Tuberculosis Other   . Tuberculosis Other   . Diabetes type II Father   . Hypertension Father   . Heart attack Maternal Aunt     Social History   Social History  . Marital status: Widowed    Spouse name: N/A  . Number of children: N/A  . Years of education: N/A   Social History Main Topics  . Smoking status: Never Smoker  . Smokeless tobacco: Never Used  . Alcohol use No  . Drug use: No  . Sexual activity: Not on file   Other Topics Concern  . Not on file   Social History Narrative  . No narrative on file     Vital Signs: BP Marland Kitchen)  165/89   Pulse 98   Temp 98 F (36.7 C) (Oral)   SpO2 100%   Physical Exam  Gen: Pleasant NAD Abd: drain site is c/d/i with old stickiness around drain from old Stat-lock device.  No evidence of infection around the drain site.  His drain is injected under fluoroscopy.  This revealed a patent cystic duct as well as CBD with eventual filling of duodenum.  The filling was very sluggish though.  His gallbladder is full of stones and his drain is in the neck of the gallbladder.  Imaging: No results found.  Labs:  CBC:  Recent Labs  10/23/16 0436 10/24/16 0518 10/25/16 0637 10/25/16 1853 10/26/16 0353  WBC 18.0* 17.2* 16.4*  --  16.1*  HGB 7.2* 7.2* 7.0* 8.6* 8.4*  HCT 22.5* 22.5* 21.8* 26.1* 25.8*  PLT 452* 462* 524*  --  611*    COAGS:  Recent  Labs  10/13/16 1414  10/23/16 1353 10/24/16 0518 10/24/16 2003 10/25/16 0637  INR 1.16  --   --   --   --   --   APTT 27  < > 75* 66* 55* 68*  < > = values in this interval not displayed.  BMP:  Recent Labs  10/23/16 0436 10/24/16 0518 10/25/16 0637 10/26/16 0353  NA 135 133* 135 135  K 3.8 3.8 3.7 4.8  CL 95* 95* 99* 99*  CO2 27 27 26 26   GLUCOSE 207* 121* 152* 161*  BUN 36* 27* 20 18  CALCIUM 7.8* 7.7* 7.8* 8.0*  CREATININE 1.81* 1.49* 1.33* 1.24  GFRNONAA 34* 44* 50* 54*  GFRAA 40* 50* 58* >60    LIVER FUNCTION TESTS:  Recent Labs  10/18/16 0544 10/20/16 0548 10/21/16 0603 10/25/16 0637  BILITOT 2.1* 1.3* 1.2 0.6  AST 117* 69* 62* 61*  ALT 161* 83* 72* 46  ALKPHOS 466* 305* 257* 203*  PROT 6.3* 6.2* 6.6 5.5*  ALBUMIN 1.9* 1.6* 1.6* 1.5*    Assessment:  1. Acute cholecystitis, s/p percutaneous cholecystostomy drain placement on 10/19/16  The patient is doing well.  His cholangiogram does show patency, but very sluggish filling.  After discussion with Dr. Kathlene Cote, we decided not to cap his drain.  Secondary to sluggish filling, multiple gallstones, and his drain being in the neck of the gallbladder, we did not feel he would succeed with capping of his drain.  We will return the drain to a gravity bag.  He likely will need to have his electrolytes monitored though given how much output he is having daily.  He is supposed to follow up with Dr. Donne Hazel on 12-03-16.  We will defer further management to Dr. Donne Hazel regarding surgery vs further drain management.  Signed: Thuy Atilano E 11/30/2016, 1:36 PM   Please refer to Dr. Margaretmary Dys attestation of this note for management and plan.

## 2016-12-01 DIAGNOSIS — D649 Anemia, unspecified: Secondary | ICD-10-CM | POA: Diagnosis not present

## 2016-12-01 DIAGNOSIS — I82492 Acute embolism and thrombosis of other specified deep vein of left lower extremity: Secondary | ICD-10-CM | POA: Diagnosis not present

## 2016-12-01 DIAGNOSIS — K810 Acute cholecystitis: Secondary | ICD-10-CM | POA: Diagnosis not present

## 2016-12-01 DIAGNOSIS — I129 Hypertensive chronic kidney disease with stage 1 through stage 4 chronic kidney disease, or unspecified chronic kidney disease: Secondary | ICD-10-CM | POA: Diagnosis not present

## 2016-12-01 DIAGNOSIS — E119 Type 2 diabetes mellitus without complications: Secondary | ICD-10-CM | POA: Diagnosis not present

## 2016-12-01 DIAGNOSIS — N183 Chronic kidney disease, stage 3 (moderate): Secondary | ICD-10-CM | POA: Diagnosis not present

## 2016-12-03 DIAGNOSIS — K8020 Calculus of gallbladder without cholecystitis without obstruction: Secondary | ICD-10-CM | POA: Diagnosis not present

## 2016-12-06 DIAGNOSIS — K819 Cholecystitis, unspecified: Secondary | ICD-10-CM | POA: Diagnosis not present

## 2016-12-06 DIAGNOSIS — E1165 Type 2 diabetes mellitus with hyperglycemia: Secondary | ICD-10-CM | POA: Diagnosis not present

## 2016-12-06 DIAGNOSIS — I10 Essential (primary) hypertension: Secondary | ICD-10-CM | POA: Diagnosis not present

## 2016-12-06 DIAGNOSIS — I82402 Acute embolism and thrombosis of unspecified deep veins of left lower extremity: Secondary | ICD-10-CM | POA: Diagnosis not present

## 2016-12-07 DIAGNOSIS — I129 Hypertensive chronic kidney disease with stage 1 through stage 4 chronic kidney disease, or unspecified chronic kidney disease: Secondary | ICD-10-CM | POA: Diagnosis not present

## 2016-12-07 DIAGNOSIS — D649 Anemia, unspecified: Secondary | ICD-10-CM | POA: Diagnosis not present

## 2016-12-07 DIAGNOSIS — E119 Type 2 diabetes mellitus without complications: Secondary | ICD-10-CM | POA: Diagnosis not present

## 2016-12-07 DIAGNOSIS — N183 Chronic kidney disease, stage 3 (moderate): Secondary | ICD-10-CM | POA: Diagnosis not present

## 2016-12-07 DIAGNOSIS — I82492 Acute embolism and thrombosis of other specified deep vein of left lower extremity: Secondary | ICD-10-CM | POA: Diagnosis not present

## 2016-12-07 DIAGNOSIS — K810 Acute cholecystitis: Secondary | ICD-10-CM | POA: Diagnosis not present

## 2016-12-10 DIAGNOSIS — K810 Acute cholecystitis: Secondary | ICD-10-CM | POA: Diagnosis not present

## 2016-12-10 DIAGNOSIS — I82492 Acute embolism and thrombosis of other specified deep vein of left lower extremity: Secondary | ICD-10-CM | POA: Diagnosis not present

## 2016-12-10 DIAGNOSIS — E119 Type 2 diabetes mellitus without complications: Secondary | ICD-10-CM | POA: Diagnosis not present

## 2016-12-10 DIAGNOSIS — D649 Anemia, unspecified: Secondary | ICD-10-CM | POA: Diagnosis not present

## 2016-12-10 DIAGNOSIS — I129 Hypertensive chronic kidney disease with stage 1 through stage 4 chronic kidney disease, or unspecified chronic kidney disease: Secondary | ICD-10-CM | POA: Diagnosis not present

## 2016-12-10 DIAGNOSIS — N183 Chronic kidney disease, stage 3 (moderate): Secondary | ICD-10-CM | POA: Diagnosis not present

## 2016-12-13 DIAGNOSIS — N183 Chronic kidney disease, stage 3 (moderate): Secondary | ICD-10-CM | POA: Diagnosis not present

## 2016-12-13 DIAGNOSIS — K810 Acute cholecystitis: Secondary | ICD-10-CM | POA: Diagnosis not present

## 2016-12-13 DIAGNOSIS — E119 Type 2 diabetes mellitus without complications: Secondary | ICD-10-CM | POA: Diagnosis not present

## 2016-12-13 DIAGNOSIS — I82492 Acute embolism and thrombosis of other specified deep vein of left lower extremity: Secondary | ICD-10-CM | POA: Diagnosis not present

## 2016-12-13 DIAGNOSIS — D649 Anemia, unspecified: Secondary | ICD-10-CM | POA: Diagnosis not present

## 2016-12-13 DIAGNOSIS — I129 Hypertensive chronic kidney disease with stage 1 through stage 4 chronic kidney disease, or unspecified chronic kidney disease: Secondary | ICD-10-CM | POA: Diagnosis not present

## 2016-12-15 DIAGNOSIS — E119 Type 2 diabetes mellitus without complications: Secondary | ICD-10-CM | POA: Diagnosis not present

## 2016-12-15 DIAGNOSIS — N183 Chronic kidney disease, stage 3 (moderate): Secondary | ICD-10-CM | POA: Diagnosis not present

## 2016-12-15 DIAGNOSIS — I82492 Acute embolism and thrombosis of other specified deep vein of left lower extremity: Secondary | ICD-10-CM | POA: Diagnosis not present

## 2016-12-15 DIAGNOSIS — K810 Acute cholecystitis: Secondary | ICD-10-CM | POA: Diagnosis not present

## 2016-12-15 DIAGNOSIS — D649 Anemia, unspecified: Secondary | ICD-10-CM | POA: Diagnosis not present

## 2016-12-15 DIAGNOSIS — I129 Hypertensive chronic kidney disease with stage 1 through stage 4 chronic kidney disease, or unspecified chronic kidney disease: Secondary | ICD-10-CM | POA: Diagnosis not present

## 2016-12-16 DIAGNOSIS — I82492 Acute embolism and thrombosis of other specified deep vein of left lower extremity: Secondary | ICD-10-CM | POA: Diagnosis not present

## 2016-12-16 DIAGNOSIS — I129 Hypertensive chronic kidney disease with stage 1 through stage 4 chronic kidney disease, or unspecified chronic kidney disease: Secondary | ICD-10-CM | POA: Diagnosis not present

## 2016-12-16 DIAGNOSIS — D649 Anemia, unspecified: Secondary | ICD-10-CM | POA: Diagnosis not present

## 2016-12-16 DIAGNOSIS — K810 Acute cholecystitis: Secondary | ICD-10-CM | POA: Diagnosis not present

## 2016-12-16 DIAGNOSIS — E119 Type 2 diabetes mellitus without complications: Secondary | ICD-10-CM | POA: Diagnosis not present

## 2016-12-16 DIAGNOSIS — N183 Chronic kidney disease, stage 3 (moderate): Secondary | ICD-10-CM | POA: Diagnosis not present

## 2016-12-17 DIAGNOSIS — I82492 Acute embolism and thrombosis of other specified deep vein of left lower extremity: Secondary | ICD-10-CM | POA: Diagnosis not present

## 2016-12-17 DIAGNOSIS — E119 Type 2 diabetes mellitus without complications: Secondary | ICD-10-CM | POA: Diagnosis not present

## 2016-12-17 DIAGNOSIS — D649 Anemia, unspecified: Secondary | ICD-10-CM | POA: Diagnosis not present

## 2016-12-17 DIAGNOSIS — N183 Chronic kidney disease, stage 3 (moderate): Secondary | ICD-10-CM | POA: Diagnosis not present

## 2016-12-17 DIAGNOSIS — K810 Acute cholecystitis: Secondary | ICD-10-CM | POA: Diagnosis not present

## 2016-12-17 DIAGNOSIS — I129 Hypertensive chronic kidney disease with stage 1 through stage 4 chronic kidney disease, or unspecified chronic kidney disease: Secondary | ICD-10-CM | POA: Diagnosis not present

## 2016-12-18 DIAGNOSIS — N183 Chronic kidney disease, stage 3 (moderate): Secondary | ICD-10-CM | POA: Diagnosis not present

## 2016-12-18 DIAGNOSIS — I82492 Acute embolism and thrombosis of other specified deep vein of left lower extremity: Secondary | ICD-10-CM | POA: Diagnosis not present

## 2016-12-18 DIAGNOSIS — K810 Acute cholecystitis: Secondary | ICD-10-CM | POA: Diagnosis not present

## 2016-12-18 DIAGNOSIS — D649 Anemia, unspecified: Secondary | ICD-10-CM | POA: Diagnosis not present

## 2016-12-18 DIAGNOSIS — E119 Type 2 diabetes mellitus without complications: Secondary | ICD-10-CM | POA: Diagnosis not present

## 2016-12-18 DIAGNOSIS — I129 Hypertensive chronic kidney disease with stage 1 through stage 4 chronic kidney disease, or unspecified chronic kidney disease: Secondary | ICD-10-CM | POA: Diagnosis not present

## 2016-12-21 DIAGNOSIS — K810 Acute cholecystitis: Secondary | ICD-10-CM | POA: Diagnosis not present

## 2016-12-21 DIAGNOSIS — N183 Chronic kidney disease, stage 3 (moderate): Secondary | ICD-10-CM | POA: Diagnosis not present

## 2016-12-21 DIAGNOSIS — D649 Anemia, unspecified: Secondary | ICD-10-CM | POA: Diagnosis not present

## 2016-12-21 DIAGNOSIS — E119 Type 2 diabetes mellitus without complications: Secondary | ICD-10-CM | POA: Diagnosis not present

## 2016-12-21 DIAGNOSIS — I82492 Acute embolism and thrombosis of other specified deep vein of left lower extremity: Secondary | ICD-10-CM | POA: Diagnosis not present

## 2016-12-21 DIAGNOSIS — I129 Hypertensive chronic kidney disease with stage 1 through stage 4 chronic kidney disease, or unspecified chronic kidney disease: Secondary | ICD-10-CM | POA: Diagnosis not present

## 2016-12-23 DIAGNOSIS — K810 Acute cholecystitis: Secondary | ICD-10-CM | POA: Diagnosis not present

## 2016-12-23 DIAGNOSIS — N183 Chronic kidney disease, stage 3 (moderate): Secondary | ICD-10-CM | POA: Diagnosis not present

## 2016-12-23 DIAGNOSIS — D649 Anemia, unspecified: Secondary | ICD-10-CM | POA: Diagnosis not present

## 2016-12-23 DIAGNOSIS — E119 Type 2 diabetes mellitus without complications: Secondary | ICD-10-CM | POA: Diagnosis not present

## 2016-12-23 DIAGNOSIS — I129 Hypertensive chronic kidney disease with stage 1 through stage 4 chronic kidney disease, or unspecified chronic kidney disease: Secondary | ICD-10-CM | POA: Diagnosis not present

## 2016-12-23 DIAGNOSIS — I82492 Acute embolism and thrombosis of other specified deep vein of left lower extremity: Secondary | ICD-10-CM | POA: Diagnosis not present

## 2016-12-24 DIAGNOSIS — I82492 Acute embolism and thrombosis of other specified deep vein of left lower extremity: Secondary | ICD-10-CM | POA: Diagnosis not present

## 2016-12-24 DIAGNOSIS — K810 Acute cholecystitis: Secondary | ICD-10-CM | POA: Diagnosis not present

## 2016-12-24 DIAGNOSIS — N183 Chronic kidney disease, stage 3 (moderate): Secondary | ICD-10-CM | POA: Diagnosis not present

## 2016-12-24 DIAGNOSIS — I129 Hypertensive chronic kidney disease with stage 1 through stage 4 chronic kidney disease, or unspecified chronic kidney disease: Secondary | ICD-10-CM | POA: Diagnosis not present

## 2016-12-24 DIAGNOSIS — D649 Anemia, unspecified: Secondary | ICD-10-CM | POA: Diagnosis not present

## 2016-12-24 DIAGNOSIS — E119 Type 2 diabetes mellitus without complications: Secondary | ICD-10-CM | POA: Diagnosis not present

## 2016-12-27 DIAGNOSIS — N183 Chronic kidney disease, stage 3 (moderate): Secondary | ICD-10-CM | POA: Diagnosis not present

## 2016-12-27 DIAGNOSIS — D649 Anemia, unspecified: Secondary | ICD-10-CM | POA: Diagnosis not present

## 2016-12-27 DIAGNOSIS — I129 Hypertensive chronic kidney disease with stage 1 through stage 4 chronic kidney disease, or unspecified chronic kidney disease: Secondary | ICD-10-CM | POA: Diagnosis not present

## 2016-12-27 DIAGNOSIS — I82492 Acute embolism and thrombosis of other specified deep vein of left lower extremity: Secondary | ICD-10-CM | POA: Diagnosis not present

## 2016-12-27 DIAGNOSIS — E119 Type 2 diabetes mellitus without complications: Secondary | ICD-10-CM | POA: Diagnosis not present

## 2016-12-27 DIAGNOSIS — K810 Acute cholecystitis: Secondary | ICD-10-CM | POA: Diagnosis not present

## 2016-12-28 DIAGNOSIS — K810 Acute cholecystitis: Secondary | ICD-10-CM | POA: Diagnosis not present

## 2016-12-28 DIAGNOSIS — I129 Hypertensive chronic kidney disease with stage 1 through stage 4 chronic kidney disease, or unspecified chronic kidney disease: Secondary | ICD-10-CM | POA: Diagnosis not present

## 2016-12-28 DIAGNOSIS — E119 Type 2 diabetes mellitus without complications: Secondary | ICD-10-CM | POA: Diagnosis not present

## 2016-12-28 DIAGNOSIS — N183 Chronic kidney disease, stage 3 (moderate): Secondary | ICD-10-CM | POA: Diagnosis not present

## 2016-12-28 DIAGNOSIS — I82492 Acute embolism and thrombosis of other specified deep vein of left lower extremity: Secondary | ICD-10-CM | POA: Diagnosis not present

## 2016-12-28 DIAGNOSIS — D649 Anemia, unspecified: Secondary | ICD-10-CM | POA: Diagnosis not present

## 2016-12-29 DIAGNOSIS — I82492 Acute embolism and thrombosis of other specified deep vein of left lower extremity: Secondary | ICD-10-CM | POA: Diagnosis not present

## 2016-12-29 DIAGNOSIS — I129 Hypertensive chronic kidney disease with stage 1 through stage 4 chronic kidney disease, or unspecified chronic kidney disease: Secondary | ICD-10-CM | POA: Diagnosis not present

## 2016-12-29 DIAGNOSIS — E119 Type 2 diabetes mellitus without complications: Secondary | ICD-10-CM | POA: Diagnosis not present

## 2016-12-29 DIAGNOSIS — D649 Anemia, unspecified: Secondary | ICD-10-CM | POA: Diagnosis not present

## 2016-12-29 DIAGNOSIS — K810 Acute cholecystitis: Secondary | ICD-10-CM | POA: Diagnosis not present

## 2016-12-29 DIAGNOSIS — N183 Chronic kidney disease, stage 3 (moderate): Secondary | ICD-10-CM | POA: Diagnosis not present

## 2016-12-30 DIAGNOSIS — I129 Hypertensive chronic kidney disease with stage 1 through stage 4 chronic kidney disease, or unspecified chronic kidney disease: Secondary | ICD-10-CM | POA: Diagnosis not present

## 2016-12-30 DIAGNOSIS — K810 Acute cholecystitis: Secondary | ICD-10-CM | POA: Diagnosis not present

## 2016-12-30 DIAGNOSIS — E119 Type 2 diabetes mellitus without complications: Secondary | ICD-10-CM | POA: Diagnosis not present

## 2016-12-30 DIAGNOSIS — D649 Anemia, unspecified: Secondary | ICD-10-CM | POA: Diagnosis not present

## 2016-12-30 DIAGNOSIS — N183 Chronic kidney disease, stage 3 (moderate): Secondary | ICD-10-CM | POA: Diagnosis not present

## 2016-12-30 DIAGNOSIS — I82492 Acute embolism and thrombosis of other specified deep vein of left lower extremity: Secondary | ICD-10-CM | POA: Diagnosis not present

## 2017-01-03 DIAGNOSIS — D649 Anemia, unspecified: Secondary | ICD-10-CM | POA: Diagnosis not present

## 2017-01-03 DIAGNOSIS — I82492 Acute embolism and thrombosis of other specified deep vein of left lower extremity: Secondary | ICD-10-CM | POA: Diagnosis not present

## 2017-01-03 DIAGNOSIS — K810 Acute cholecystitis: Secondary | ICD-10-CM | POA: Diagnosis not present

## 2017-01-03 DIAGNOSIS — E119 Type 2 diabetes mellitus without complications: Secondary | ICD-10-CM | POA: Diagnosis not present

## 2017-01-03 DIAGNOSIS — N183 Chronic kidney disease, stage 3 (moderate): Secondary | ICD-10-CM | POA: Diagnosis not present

## 2017-01-03 DIAGNOSIS — I129 Hypertensive chronic kidney disease with stage 1 through stage 4 chronic kidney disease, or unspecified chronic kidney disease: Secondary | ICD-10-CM | POA: Diagnosis not present

## 2017-01-04 DIAGNOSIS — E119 Type 2 diabetes mellitus without complications: Secondary | ICD-10-CM | POA: Diagnosis not present

## 2017-01-04 DIAGNOSIS — I129 Hypertensive chronic kidney disease with stage 1 through stage 4 chronic kidney disease, or unspecified chronic kidney disease: Secondary | ICD-10-CM | POA: Diagnosis not present

## 2017-01-04 DIAGNOSIS — I82492 Acute embolism and thrombosis of other specified deep vein of left lower extremity: Secondary | ICD-10-CM | POA: Diagnosis not present

## 2017-01-04 DIAGNOSIS — N183 Chronic kidney disease, stage 3 (moderate): Secondary | ICD-10-CM | POA: Diagnosis not present

## 2017-01-04 DIAGNOSIS — D649 Anemia, unspecified: Secondary | ICD-10-CM | POA: Diagnosis not present

## 2017-01-04 DIAGNOSIS — K810 Acute cholecystitis: Secondary | ICD-10-CM | POA: Diagnosis not present

## 2017-01-06 DIAGNOSIS — K810 Acute cholecystitis: Secondary | ICD-10-CM | POA: Diagnosis not present

## 2017-01-06 DIAGNOSIS — E119 Type 2 diabetes mellitus without complications: Secondary | ICD-10-CM | POA: Diagnosis not present

## 2017-01-06 DIAGNOSIS — I129 Hypertensive chronic kidney disease with stage 1 through stage 4 chronic kidney disease, or unspecified chronic kidney disease: Secondary | ICD-10-CM | POA: Diagnosis not present

## 2017-01-06 DIAGNOSIS — N183 Chronic kidney disease, stage 3 (moderate): Secondary | ICD-10-CM | POA: Diagnosis not present

## 2017-01-06 DIAGNOSIS — I82492 Acute embolism and thrombosis of other specified deep vein of left lower extremity: Secondary | ICD-10-CM | POA: Diagnosis not present

## 2017-01-06 DIAGNOSIS — D649 Anemia, unspecified: Secondary | ICD-10-CM | POA: Diagnosis not present

## 2017-01-07 DIAGNOSIS — K819 Cholecystitis, unspecified: Secondary | ICD-10-CM | POA: Diagnosis not present

## 2017-01-10 DIAGNOSIS — N183 Chronic kidney disease, stage 3 (moderate): Secondary | ICD-10-CM | POA: Diagnosis not present

## 2017-01-10 DIAGNOSIS — K810 Acute cholecystitis: Secondary | ICD-10-CM | POA: Diagnosis not present

## 2017-01-10 DIAGNOSIS — D649 Anemia, unspecified: Secondary | ICD-10-CM | POA: Diagnosis not present

## 2017-01-10 DIAGNOSIS — I82492 Acute embolism and thrombosis of other specified deep vein of left lower extremity: Secondary | ICD-10-CM | POA: Diagnosis not present

## 2017-01-10 DIAGNOSIS — I129 Hypertensive chronic kidney disease with stage 1 through stage 4 chronic kidney disease, or unspecified chronic kidney disease: Secondary | ICD-10-CM | POA: Diagnosis not present

## 2017-01-10 DIAGNOSIS — E119 Type 2 diabetes mellitus without complications: Secondary | ICD-10-CM | POA: Diagnosis not present

## 2017-01-13 DIAGNOSIS — K810 Acute cholecystitis: Secondary | ICD-10-CM | POA: Diagnosis not present

## 2017-01-13 DIAGNOSIS — I82492 Acute embolism and thrombosis of other specified deep vein of left lower extremity: Secondary | ICD-10-CM | POA: Diagnosis not present

## 2017-01-13 DIAGNOSIS — D649 Anemia, unspecified: Secondary | ICD-10-CM | POA: Diagnosis not present

## 2017-01-13 DIAGNOSIS — E119 Type 2 diabetes mellitus without complications: Secondary | ICD-10-CM | POA: Diagnosis not present

## 2017-01-13 DIAGNOSIS — N183 Chronic kidney disease, stage 3 (moderate): Secondary | ICD-10-CM | POA: Diagnosis not present

## 2017-01-13 DIAGNOSIS — I129 Hypertensive chronic kidney disease with stage 1 through stage 4 chronic kidney disease, or unspecified chronic kidney disease: Secondary | ICD-10-CM | POA: Diagnosis not present

## 2017-01-17 DIAGNOSIS — N183 Chronic kidney disease, stage 3 (moderate): Secondary | ICD-10-CM | POA: Diagnosis not present

## 2017-01-17 DIAGNOSIS — D649 Anemia, unspecified: Secondary | ICD-10-CM | POA: Diagnosis not present

## 2017-01-17 DIAGNOSIS — K810 Acute cholecystitis: Secondary | ICD-10-CM | POA: Diagnosis not present

## 2017-01-17 DIAGNOSIS — E119 Type 2 diabetes mellitus without complications: Secondary | ICD-10-CM | POA: Diagnosis not present

## 2017-01-17 DIAGNOSIS — I129 Hypertensive chronic kidney disease with stage 1 through stage 4 chronic kidney disease, or unspecified chronic kidney disease: Secondary | ICD-10-CM | POA: Diagnosis not present

## 2017-01-17 DIAGNOSIS — I82492 Acute embolism and thrombosis of other specified deep vein of left lower extremity: Secondary | ICD-10-CM | POA: Diagnosis not present

## 2017-01-27 DIAGNOSIS — E119 Type 2 diabetes mellitus without complications: Secondary | ICD-10-CM | POA: Diagnosis not present

## 2017-01-27 DIAGNOSIS — D649 Anemia, unspecified: Secondary | ICD-10-CM | POA: Diagnosis not present

## 2017-01-27 DIAGNOSIS — K810 Acute cholecystitis: Secondary | ICD-10-CM | POA: Diagnosis not present

## 2017-01-27 DIAGNOSIS — I129 Hypertensive chronic kidney disease with stage 1 through stage 4 chronic kidney disease, or unspecified chronic kidney disease: Secondary | ICD-10-CM | POA: Diagnosis not present

## 2017-01-27 DIAGNOSIS — I82492 Acute embolism and thrombosis of other specified deep vein of left lower extremity: Secondary | ICD-10-CM | POA: Diagnosis not present

## 2017-01-27 DIAGNOSIS — N183 Chronic kidney disease, stage 3 (moderate): Secondary | ICD-10-CM | POA: Diagnosis not present

## 2017-02-01 DIAGNOSIS — K810 Acute cholecystitis: Secondary | ICD-10-CM | POA: Diagnosis not present

## 2017-02-01 DIAGNOSIS — I129 Hypertensive chronic kidney disease with stage 1 through stage 4 chronic kidney disease, or unspecified chronic kidney disease: Secondary | ICD-10-CM | POA: Diagnosis not present

## 2017-02-01 DIAGNOSIS — E1122 Type 2 diabetes mellitus with diabetic chronic kidney disease: Secondary | ICD-10-CM | POA: Diagnosis not present

## 2017-02-01 DIAGNOSIS — I82492 Acute embolism and thrombosis of other specified deep vein of left lower extremity: Secondary | ICD-10-CM | POA: Diagnosis not present

## 2017-02-01 DIAGNOSIS — D649 Anemia, unspecified: Secondary | ICD-10-CM | POA: Diagnosis not present

## 2017-02-01 DIAGNOSIS — N183 Chronic kidney disease, stage 3 (moderate): Secondary | ICD-10-CM | POA: Diagnosis not present

## 2017-02-03 DIAGNOSIS — E1122 Type 2 diabetes mellitus with diabetic chronic kidney disease: Secondary | ICD-10-CM | POA: Diagnosis not present

## 2017-02-03 DIAGNOSIS — D649 Anemia, unspecified: Secondary | ICD-10-CM | POA: Diagnosis not present

## 2017-02-03 DIAGNOSIS — K810 Acute cholecystitis: Secondary | ICD-10-CM | POA: Diagnosis not present

## 2017-02-03 DIAGNOSIS — I82492 Acute embolism and thrombosis of other specified deep vein of left lower extremity: Secondary | ICD-10-CM | POA: Diagnosis not present

## 2017-02-03 DIAGNOSIS — N183 Chronic kidney disease, stage 3 (moderate): Secondary | ICD-10-CM | POA: Diagnosis not present

## 2017-02-03 DIAGNOSIS — I129 Hypertensive chronic kidney disease with stage 1 through stage 4 chronic kidney disease, or unspecified chronic kidney disease: Secondary | ICD-10-CM | POA: Diagnosis not present

## 2017-02-07 DIAGNOSIS — I129 Hypertensive chronic kidney disease with stage 1 through stage 4 chronic kidney disease, or unspecified chronic kidney disease: Secondary | ICD-10-CM | POA: Diagnosis not present

## 2017-02-07 DIAGNOSIS — N183 Chronic kidney disease, stage 3 (moderate): Secondary | ICD-10-CM | POA: Diagnosis not present

## 2017-02-07 DIAGNOSIS — D649 Anemia, unspecified: Secondary | ICD-10-CM | POA: Diagnosis not present

## 2017-02-07 DIAGNOSIS — E1122 Type 2 diabetes mellitus with diabetic chronic kidney disease: Secondary | ICD-10-CM | POA: Diagnosis not present

## 2017-02-07 DIAGNOSIS — I82492 Acute embolism and thrombosis of other specified deep vein of left lower extremity: Secondary | ICD-10-CM | POA: Diagnosis not present

## 2017-02-07 DIAGNOSIS — K810 Acute cholecystitis: Secondary | ICD-10-CM | POA: Diagnosis not present

## 2017-02-10 DIAGNOSIS — I82492 Acute embolism and thrombosis of other specified deep vein of left lower extremity: Secondary | ICD-10-CM | POA: Diagnosis not present

## 2017-02-10 DIAGNOSIS — I129 Hypertensive chronic kidney disease with stage 1 through stage 4 chronic kidney disease, or unspecified chronic kidney disease: Secondary | ICD-10-CM | POA: Diagnosis not present

## 2017-02-10 DIAGNOSIS — K810 Acute cholecystitis: Secondary | ICD-10-CM | POA: Diagnosis not present

## 2017-02-10 DIAGNOSIS — N183 Chronic kidney disease, stage 3 (moderate): Secondary | ICD-10-CM | POA: Diagnosis not present

## 2017-02-10 DIAGNOSIS — E1122 Type 2 diabetes mellitus with diabetic chronic kidney disease: Secondary | ICD-10-CM | POA: Diagnosis not present

## 2017-02-10 DIAGNOSIS — E1165 Type 2 diabetes mellitus with hyperglycemia: Secondary | ICD-10-CM | POA: Diagnosis not present

## 2017-02-10 DIAGNOSIS — D649 Anemia, unspecified: Secondary | ICD-10-CM | POA: Diagnosis not present

## 2017-02-14 DIAGNOSIS — I129 Hypertensive chronic kidney disease with stage 1 through stage 4 chronic kidney disease, or unspecified chronic kidney disease: Secondary | ICD-10-CM | POA: Diagnosis not present

## 2017-02-14 DIAGNOSIS — K810 Acute cholecystitis: Secondary | ICD-10-CM | POA: Diagnosis not present

## 2017-02-14 DIAGNOSIS — N183 Chronic kidney disease, stage 3 (moderate): Secondary | ICD-10-CM | POA: Diagnosis not present

## 2017-02-14 DIAGNOSIS — E1122 Type 2 diabetes mellitus with diabetic chronic kidney disease: Secondary | ICD-10-CM | POA: Diagnosis not present

## 2017-02-14 DIAGNOSIS — D649 Anemia, unspecified: Secondary | ICD-10-CM | POA: Diagnosis not present

## 2017-02-14 DIAGNOSIS — I82492 Acute embolism and thrombosis of other specified deep vein of left lower extremity: Secondary | ICD-10-CM | POA: Diagnosis not present

## 2017-02-17 DIAGNOSIS — I129 Hypertensive chronic kidney disease with stage 1 through stage 4 chronic kidney disease, or unspecified chronic kidney disease: Secondary | ICD-10-CM | POA: Diagnosis not present

## 2017-02-17 DIAGNOSIS — E1122 Type 2 diabetes mellitus with diabetic chronic kidney disease: Secondary | ICD-10-CM | POA: Diagnosis not present

## 2017-02-17 DIAGNOSIS — I82492 Acute embolism and thrombosis of other specified deep vein of left lower extremity: Secondary | ICD-10-CM | POA: Diagnosis not present

## 2017-02-17 DIAGNOSIS — N183 Chronic kidney disease, stage 3 (moderate): Secondary | ICD-10-CM | POA: Diagnosis not present

## 2017-02-17 DIAGNOSIS — K810 Acute cholecystitis: Secondary | ICD-10-CM | POA: Diagnosis not present

## 2017-02-17 DIAGNOSIS — D649 Anemia, unspecified: Secondary | ICD-10-CM | POA: Diagnosis not present

## 2017-02-21 DIAGNOSIS — K810 Acute cholecystitis: Secondary | ICD-10-CM | POA: Diagnosis not present

## 2017-02-21 DIAGNOSIS — E1122 Type 2 diabetes mellitus with diabetic chronic kidney disease: Secondary | ICD-10-CM | POA: Diagnosis not present

## 2017-02-21 DIAGNOSIS — D649 Anemia, unspecified: Secondary | ICD-10-CM | POA: Diagnosis not present

## 2017-02-21 DIAGNOSIS — N183 Chronic kidney disease, stage 3 (moderate): Secondary | ICD-10-CM | POA: Diagnosis not present

## 2017-02-21 DIAGNOSIS — I129 Hypertensive chronic kidney disease with stage 1 through stage 4 chronic kidney disease, or unspecified chronic kidney disease: Secondary | ICD-10-CM | POA: Diagnosis not present

## 2017-02-21 DIAGNOSIS — I82492 Acute embolism and thrombosis of other specified deep vein of left lower extremity: Secondary | ICD-10-CM | POA: Diagnosis not present

## 2017-02-24 ENCOUNTER — Encounter (HOSPITAL_COMMUNITY): Payer: Self-pay | Admitting: Emergency Medicine

## 2017-02-24 ENCOUNTER — Emergency Department (HOSPITAL_COMMUNITY)
Admission: EM | Admit: 2017-02-24 | Discharge: 2017-02-24 | Disposition: A | Discharge status: Home / Self Care | Payer: Medicare HMO | Attending: Emergency Medicine | Admitting: Emergency Medicine

## 2017-02-24 DIAGNOSIS — N183 Chronic kidney disease, stage 3 (moderate): Secondary | ICD-10-CM | POA: Diagnosis not present

## 2017-02-24 DIAGNOSIS — K8019 Calculus of gallbladder with other cholecystitis with obstruction: Secondary | ICD-10-CM | POA: Diagnosis not present

## 2017-02-24 DIAGNOSIS — E1122 Type 2 diabetes mellitus with diabetic chronic kidney disease: Secondary | ICD-10-CM | POA: Diagnosis not present

## 2017-02-24 DIAGNOSIS — Z7984 Long term (current) use of oral hypoglycemic drugs: Secondary | ICD-10-CM | POA: Diagnosis not present

## 2017-02-24 DIAGNOSIS — K8011 Calculus of gallbladder with chronic cholecystitis with obstruction: Secondary | ICD-10-CM

## 2017-02-24 DIAGNOSIS — I129 Hypertensive chronic kidney disease with stage 1 through stage 4 chronic kidney disease, or unspecified chronic kidney disease: Secondary | ICD-10-CM | POA: Diagnosis not present

## 2017-02-24 DIAGNOSIS — Z7901 Long term (current) use of anticoagulants: Secondary | ICD-10-CM | POA: Diagnosis not present

## 2017-02-24 DIAGNOSIS — Z7982 Long term (current) use of aspirin: Secondary | ICD-10-CM | POA: Insufficient documentation

## 2017-02-24 DIAGNOSIS — R109 Unspecified abdominal pain: Secondary | ICD-10-CM | POA: Diagnosis present

## 2017-02-24 LAB — LIPASE, BLOOD: LIPASE: 14 U/L (ref 11–51)

## 2017-02-24 LAB — CBC
HEMATOCRIT: 39 % (ref 39.0–52.0)
Hemoglobin: 12.1 g/dL — ABNORMAL LOW (ref 13.0–17.0)
MCH: 27.1 pg (ref 26.0–34.0)
MCHC: 31 g/dL (ref 30.0–36.0)
MCV: 87.2 fL (ref 78.0–100.0)
Platelets: 235 10*3/uL (ref 150–400)
RBC: 4.47 MIL/uL (ref 4.22–5.81)
RDW: 14.8 % (ref 11.5–15.5)
WBC: 7 10*3/uL (ref 4.0–10.5)

## 2017-02-24 LAB — COMPREHENSIVE METABOLIC PANEL
ALT: 19 U/L (ref 17–63)
AST: 25 U/L (ref 15–41)
Albumin: 3.5 g/dL (ref 3.5–5.0)
Alkaline Phosphatase: 104 U/L (ref 38–126)
Anion gap: 10 (ref 5–15)
BILIRUBIN TOTAL: 0.3 mg/dL (ref 0.3–1.2)
BUN: 25 mg/dL — AB (ref 6–20)
CHLORIDE: 103 mmol/L (ref 101–111)
CO2: 26 mmol/L (ref 22–32)
Calcium: 9.2 mg/dL (ref 8.9–10.3)
Creatinine, Ser: 1.12 mg/dL (ref 0.61–1.24)
GFR calc non Af Amer: >60 mL/min (ref >60)
Glucose, Bld: 150 mg/dL — ABNORMAL HIGH (ref 65–99)
POTASSIUM: 3.8 mmol/L (ref 3.5–5.1)
Sodium: 139 mmol/L (ref 135–145)
Total Protein: 7.3 g/dL (ref 6.5–8.1)

## 2017-02-24 NOTE — ED Triage Notes (Signed)
Was had a cystic duct obstructionconsistent with acute cholecystitis in november and had a  cholecystectomy tube placed. Pt is awaiting medical clearance for surgery and today tube came out. Pt denies any pain.

## 2017-02-24 NOTE — ED Provider Notes (Signed)
[name]   CSN: [id number] Arrival date & time: 02/24/17  1602     History   Chief Complaint Chief Complaint  Patient presents with  . Abdominal Pain    HPI Stephen Rogers. is a 76 y.o. male with a hx of CKD, DM, DVT, HTN presents to the Emergency Department with complaints that his drain fell out. Pt reports he has acute cholecystitis with placement of a drain in Nov 2017.  Sometime, yesterday the drain fell out.  Patient reports he was emptying the drain 2 times per day.  He is unable to describe the type of fluid.  Pt denies pain, fever, chills.  He is asymptomatic at this time.  Pt developed a DVT and began on eliquis therefore, they cholecystectomy was delayed.  Patient has finished his Eliquis regimen and has a planned follow-up with his primary care on April 16. They have not contacted the surgeon. No aggravating or alleviating symptoms. Pt is accompanied by his sister  She was admitted on 10/12/2016 for leg swelling, weakness and DVT. CT scan of his abdomen that time revealed concern for cholecystitis however patient was nontender on exam. Multiple consults with [facility] gastroenterology and general surgery with plan for cholecystectomy however patient with significant comorbidities. Her choline performed on 10/19/2016 by Dr. Kathlene Cote with plan for interval cystectomy at 6 weeks. Drain was exchanged on 12/4 and ID was contacted for abx management.  D/C on 12/5 to SNF with plan for drain management by IR in drain clinic and surgical management by Dr. Donne Hazel. Pt to complete 3 weeks of Cipro PO   The history is provided by the patient and medical records. No language interpreter was used.    Past Medical History:  Diagnosis Date  . CKD (chronic kidney disease), stage III   . Diabetes mellitus   . DVT (deep venous thrombosis) ([place]) 10/14/2016  . Extremity edema 09/2016  . Hyperlipemia   . Hypertension     Patient Active Problem List   Diagnosis Date  Noted  . Cholecystitis   . Fever   . At high risk for fluid overload 10/13/2016  . Fluid overload 10/13/2016  . Bilateral lower extremity edema 10/13/2016  . Gait disturbance 10/13/2016  . Leukocytosis 10/13/2016  . CKD (chronic kidney disease), stage III 10/13/2016  . Left leg DVT ([place]) 10/13/2016  . Acute renal failure (ARF) ([place]) 10/08/2016  . Nausea and vomiting 10/08/2016  . Diabetes mellitus ([place]) 10/08/2016  . Transaminitis 10/08/2016  . Hyperlipemia 10/08/2016  . Renal insufficiency   . Dehydration 10/07/2016    Past Surgical History:  Procedure Laterality Date  . COLONOSCOPY    . IR GENERIC HISTORICAL  10/19/2016   IR PERC CHOLECYSTOSTOMY 10/19/2016 [name], MD [facility]  . IR GENERIC HISTORICAL  10/25/2016   IR CATHETER TUBE CHANGE 10/25/2016 [name], MD [facility]  . IR GENERIC HISTORICAL  11/30/2016   IR RADIOLOGIST EVAL & MGMT 11/30/2016 [facility] INTERV RAD       Home Medications    Prior to Admission medications   Medication Sig Start Date End Date Taking? Authorizing Provider  apixaban (ELIQUIS) 5 MG TABS tablet Take 1 tablet (5 mg total) by mouth 2 (two) times daily. 10/26/16   Doreatha Lew, MD  aspirin EC 81 MG tablet Take 81 mg by mouth every morning.     Historical Provider, MD  furosemide (LASIX) 20 MG tablet Take 10 mg by mouth daily as needed for edema  or fluid. 08/14/16   Historical Provider, MD  glipiZIDE (GLUCOTROL) 10 MG tablet Take 10 mg by mouth daily before breakfast.     Historical Provider, MD  hydrALAZINE (APRESOLINE) 25 MG tablet Take 25 mg by mouth every evening.     Historical Provider, MD  metFORMIN (GLUCOPHAGE) 1000 MG tablet Take 1,000 mg by mouth every evening.     Historical Provider, MD  simvastatin (ZOCOR) 40 MG tablet Take 40 mg by mouth every evening.     Historical Provider, MD  verapamil (CALAN-Rogers) 240 MG CR tablet Take 240 mg by mouth at bedtime.    Historical Provider, MD    Family History Family History    Problem Relation Age of Onset  . Heart attack Other   . Tuberculosis Other   . Tuberculosis Other   . Diabetes type II Father   . Hypertension Father   . Heart attack Maternal Aunt     Social History Social History  Substance Use Topics  . Smoking status: Never Smoker  . Smokeless tobacco: Never Used  . Alcohol use No     Allergies   Patient has no known allergies.   Review of Systems Review of Systems  Gastrointestinal:       Per chole drain displaced  All other systems reviewed and are negative.    Physical Exam Updated Vital Signs BP (!) 155/76 (BP Location: Right Arm)   Pulse 86   Temp 98.1 F (36.7 C) (Oral)   Resp 16   SpO2 100%   Physical Exam  Constitutional: He appears well-developed and well-nourished. No distress.  Awake, alert, nontoxic appearance  HENT:  Head: Normocephalic and atraumatic.  Mouth/Throat: Oropharynx is clear and moist. No oropharyngeal exudate.  Eyes: Conjunctivae are normal. No scleral icterus.  Neck: Normal range of motion. Neck supple.  Cardiovascular: Normal rate, regular rhythm and intact distal pulses.   Pulmonary/Chest: Effort normal and breath sounds normal. No respiratory distress. He has no wheezes.  Equal chest expansion  Abdominal: Soft. Bowel sounds are normal. He exhibits no mass. There is no tenderness. There is no rebound and no guarding.  Perc Chole incision in the right upper quadrant with minimal surrounding erythema, no induration. No active drainage Abdomen is soft and nontender. No right upper quadrant tenderness.  Musculoskeletal: Normal range of motion. He exhibits no edema.  Neurological: He is alert.  Speech is clear and goal oriented Moves extremities without ataxia  Skin: Skin is warm and dry. He is not diaphoretic.  Psychiatric: He has a normal mood and affect.  Nursing note and vitals reviewed.    ED Treatments / Results  Labs (all labs ordered are listed, but only abnormal results are  displayed) Labs Reviewed  COMPREHENSIVE METABOLIC PANEL - Abnormal; Notable for the following:       Result Value   Glucose, Bld 150 (*)    BUN 25 (*)    All other components within normal limits  CBC - Abnormal; Notable for the following:    Hemoglobin 12.1 (*)    All other components within normal limits  LIPASE, BLOOD    Procedures Procedures (including critical care time)  Medications Ordered in ED Medications - No data to display   Initial Impression / Assessment and Plan / ED Course  I have reviewed the triage vital signs and the nursing notes.  Pertinent labs & imaging results that were available during my care of the patient were reviewed by me and considered in my medical  decision making (see chart for details).  Clinical Course as of Feb 25 2123  Thu Feb 24, 2017  2112 Dr. Ralene Bathe discussed with Dr. Grandville Silos who recommends d/c home and close F/U with surgery   [HM]  2113 Patient noted to be hypertensive in the emergency department.  No signs of hypertensive urgency.  Discussed with patient the need for close follow-up and management by their primary care physician.    [HM]  2114 Pt well appearing   [HM]    Clinical Course User Index [HM] Jarrett Soho Jancie Kercher, PA-C    Pt presents with loss of his perc chole tube yesterday.  Pt with normal vital signs, normal labs (no elevation in AST/ALT).  Abd soft and nontender.  No complaints.  Discussed with Surgery who recommends outpatient f/u.   The patient was discussed with and seen by Dr. Ralene Bathe who agrees with the treatment plan.   Final Clinical Impressions(s) / ED Diagnoses   Final diagnoses:  Calculus of gallbladder with cholecystitis with biliary obstruction, unspecified cholecystitis acuity    New Prescriptions New Prescriptions   No medications on file     Abigail Butts, PA-C 02/24/17 2125    Quintella Reichert, MD 03/03/17 [phone]

## 2017-02-24 NOTE — Discharge Instructions (Signed)
1. Medications: usual home medications 2. Treatment: rest, drink plenty of fluids,  3. Follow Up: Please call Dr. Donne Hazel tomorrow morning to set up a follow-up appointment. Please return immediately to the emergency department for abdominal pain, fevers, nausea, vomiting, decreased appetite or any other concerns.

## 2017-02-25 DIAGNOSIS — I82492 Acute embolism and thrombosis of other specified deep vein of left lower extremity: Secondary | ICD-10-CM | POA: Diagnosis not present

## 2017-03-01 DIAGNOSIS — I82492 Acute embolism and thrombosis of other specified deep vein of left lower extremity: Secondary | ICD-10-CM | POA: Diagnosis not present

## 2017-03-01 DIAGNOSIS — K810 Acute cholecystitis: Secondary | ICD-10-CM | POA: Diagnosis not present

## 2017-03-01 DIAGNOSIS — N183 Chronic kidney disease, stage 3 (moderate): Secondary | ICD-10-CM | POA: Diagnosis not present

## 2017-03-01 DIAGNOSIS — E1122 Type 2 diabetes mellitus with diabetic chronic kidney disease: Secondary | ICD-10-CM | POA: Diagnosis not present

## 2017-03-01 DIAGNOSIS — I129 Hypertensive chronic kidney disease with stage 1 through stage 4 chronic kidney disease, or unspecified chronic kidney disease: Secondary | ICD-10-CM | POA: Diagnosis not present

## 2017-03-01 DIAGNOSIS — D649 Anemia, unspecified: Secondary | ICD-10-CM | POA: Diagnosis not present

## 2017-03-07 DIAGNOSIS — E1165 Type 2 diabetes mellitus with hyperglycemia: Secondary | ICD-10-CM | POA: Diagnosis not present

## 2017-03-07 DIAGNOSIS — Z6833 Body mass index (BMI) 33.0-33.9, adult: Secondary | ICD-10-CM | POA: Diagnosis not present

## 2017-03-07 DIAGNOSIS — I10 Essential (primary) hypertension: Secondary | ICD-10-CM | POA: Diagnosis not present

## 2017-03-08 DIAGNOSIS — I129 Hypertensive chronic kidney disease with stage 1 through stage 4 chronic kidney disease, or unspecified chronic kidney disease: Secondary | ICD-10-CM | POA: Diagnosis not present

## 2017-03-08 DIAGNOSIS — K810 Acute cholecystitis: Secondary | ICD-10-CM | POA: Diagnosis not present

## 2017-03-08 DIAGNOSIS — N183 Chronic kidney disease, stage 3 (moderate): Secondary | ICD-10-CM | POA: Diagnosis not present

## 2017-03-08 DIAGNOSIS — I82492 Acute embolism and thrombosis of other specified deep vein of left lower extremity: Secondary | ICD-10-CM | POA: Diagnosis not present

## 2017-03-08 DIAGNOSIS — D649 Anemia, unspecified: Secondary | ICD-10-CM | POA: Diagnosis not present

## 2017-03-08 DIAGNOSIS — E1122 Type 2 diabetes mellitus with diabetic chronic kidney disease: Secondary | ICD-10-CM | POA: Diagnosis not present

## 2017-03-17 DIAGNOSIS — N183 Chronic kidney disease, stage 3 (moderate): Secondary | ICD-10-CM | POA: Diagnosis not present

## 2017-03-17 DIAGNOSIS — E1122 Type 2 diabetes mellitus with diabetic chronic kidney disease: Secondary | ICD-10-CM | POA: Diagnosis not present

## 2017-03-17 DIAGNOSIS — I82492 Acute embolism and thrombosis of other specified deep vein of left lower extremity: Secondary | ICD-10-CM | POA: Diagnosis not present

## 2017-03-17 DIAGNOSIS — D649 Anemia, unspecified: Secondary | ICD-10-CM | POA: Diagnosis not present

## 2017-03-17 DIAGNOSIS — I129 Hypertensive chronic kidney disease with stage 1 through stage 4 chronic kidney disease, or unspecified chronic kidney disease: Secondary | ICD-10-CM | POA: Diagnosis not present

## 2017-03-17 DIAGNOSIS — K810 Acute cholecystitis: Secondary | ICD-10-CM | POA: Diagnosis not present

## 2017-03-21 DIAGNOSIS — N183 Chronic kidney disease, stage 3 (moderate): Secondary | ICD-10-CM | POA: Diagnosis not present

## 2017-03-21 DIAGNOSIS — E1122 Type 2 diabetes mellitus with diabetic chronic kidney disease: Secondary | ICD-10-CM | POA: Diagnosis not present

## 2017-03-21 DIAGNOSIS — K810 Acute cholecystitis: Secondary | ICD-10-CM | POA: Diagnosis not present

## 2017-03-21 DIAGNOSIS — D649 Anemia, unspecified: Secondary | ICD-10-CM | POA: Diagnosis not present

## 2017-03-21 DIAGNOSIS — I82492 Acute embolism and thrombosis of other specified deep vein of left lower extremity: Secondary | ICD-10-CM | POA: Diagnosis not present

## 2017-03-21 DIAGNOSIS — I129 Hypertensive chronic kidney disease with stage 1 through stage 4 chronic kidney disease, or unspecified chronic kidney disease: Secondary | ICD-10-CM | POA: Diagnosis not present

## 2017-03-24 DIAGNOSIS — I10 Essential (primary) hypertension: Secondary | ICD-10-CM | POA: Diagnosis not present

## 2017-03-25 DIAGNOSIS — K8020 Calculus of gallbladder without cholecystitis without obstruction: Secondary | ICD-10-CM | POA: Diagnosis not present

## 2017-03-30 DIAGNOSIS — N183 Chronic kidney disease, stage 3 (moderate): Secondary | ICD-10-CM | POA: Diagnosis not present

## 2017-03-30 DIAGNOSIS — I129 Hypertensive chronic kidney disease with stage 1 through stage 4 chronic kidney disease, or unspecified chronic kidney disease: Secondary | ICD-10-CM | POA: Diagnosis not present

## 2017-03-30 DIAGNOSIS — K810 Acute cholecystitis: Secondary | ICD-10-CM | POA: Diagnosis not present

## 2017-03-30 DIAGNOSIS — D649 Anemia, unspecified: Secondary | ICD-10-CM | POA: Diagnosis not present

## 2017-03-30 DIAGNOSIS — E1122 Type 2 diabetes mellitus with diabetic chronic kidney disease: Secondary | ICD-10-CM | POA: Diagnosis not present

## 2017-03-30 DIAGNOSIS — I82492 Acute embolism and thrombosis of other specified deep vein of left lower extremity: Secondary | ICD-10-CM | POA: Diagnosis not present

## 2017-03-31 IMAGING — RF DG SINUS / FISTULA TRACT / ABSCESSOGRAM
5 series · 14 of 20 positions shown · non-contrast
Comparison: none

INDICATION: Status post percutaneous cholecystostomy tube placement on
10/19/2016 with additional exchange and up sizing on 10/25/2016.
Assessment of biliary patency now performed prior to further
surgical evaluation for possible cholecystectomy.

[Series 1: one shot · 1 of 1 slices shown (1 of 3)]
[im 1/1]
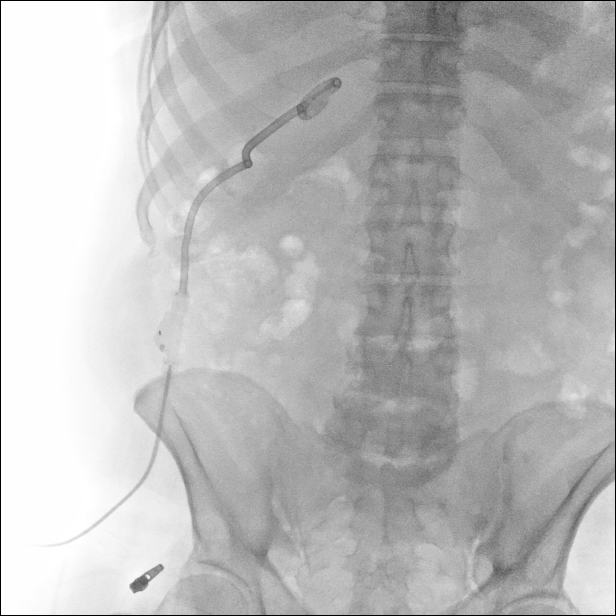

[Series 2: sequence · 2 acquisitions, 5 frames shown (1 of 2)]
[im 1/2]
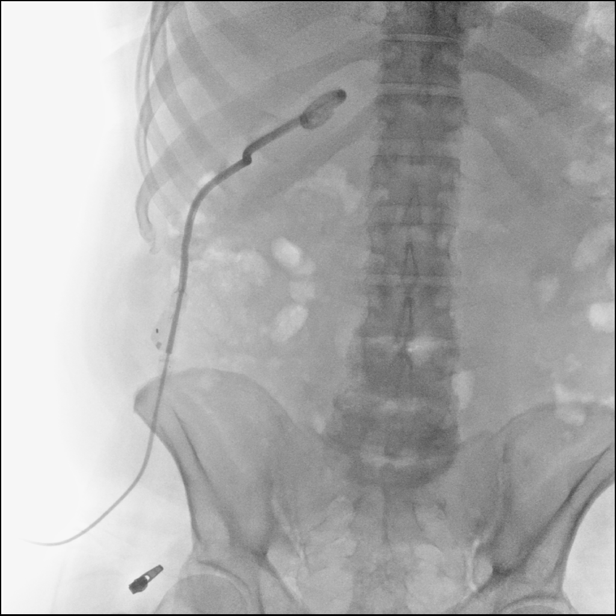
[im 1/2]
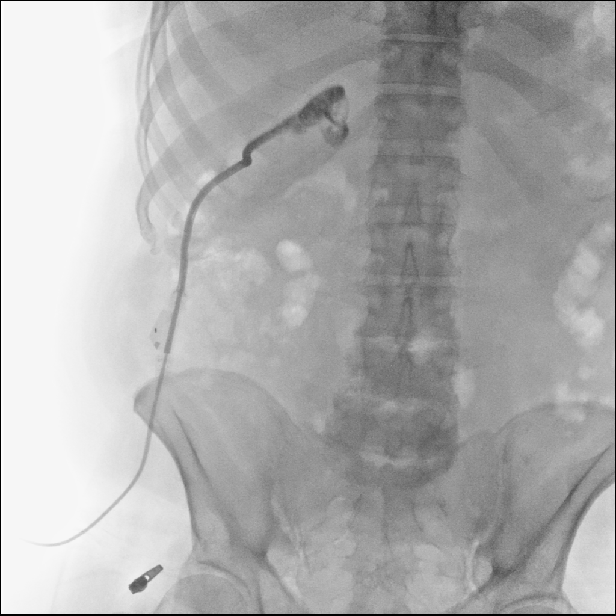
[im 2/2]
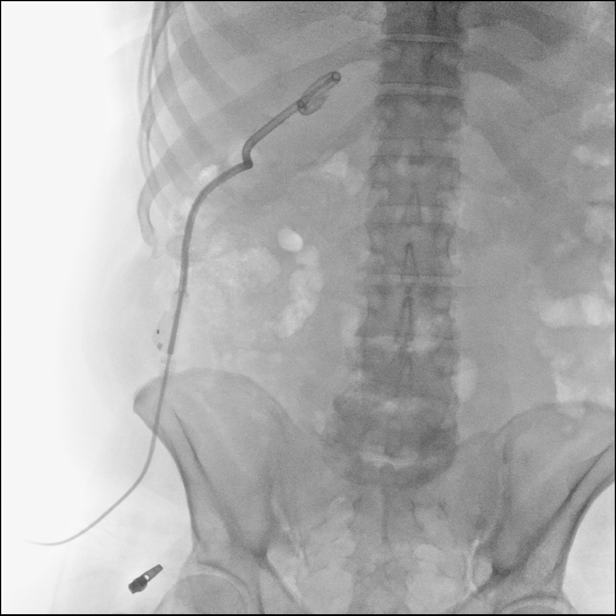
[im 2/2]
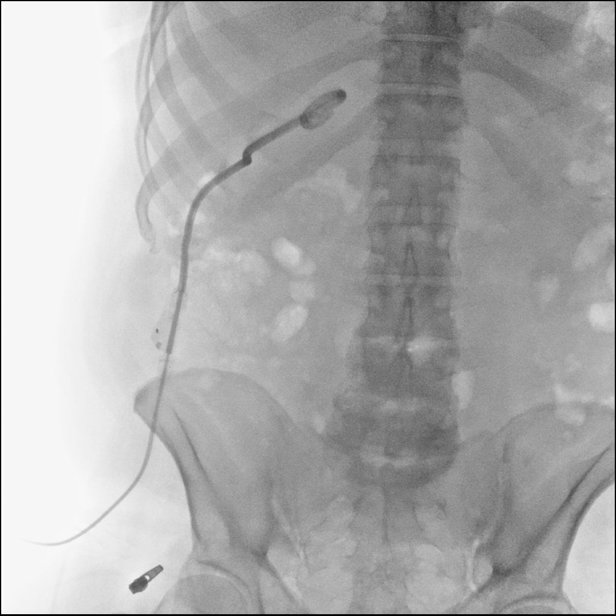
[im 2/2]
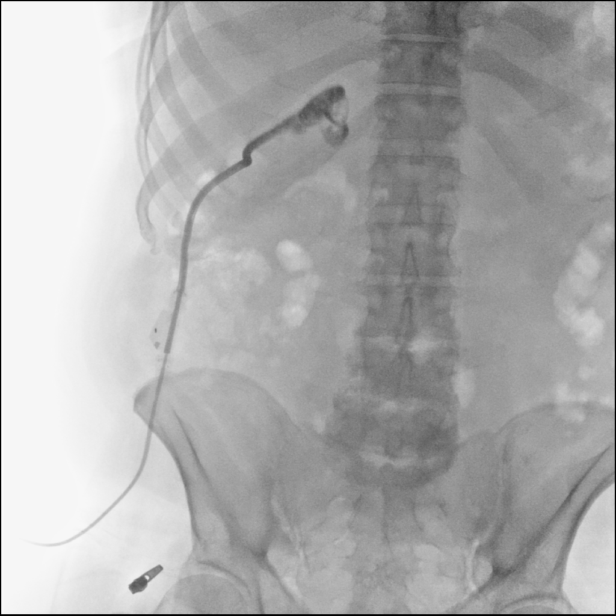

[Series 3: one shot · 1 of 1 slices shown (2 of 3)]
[im 1/1]
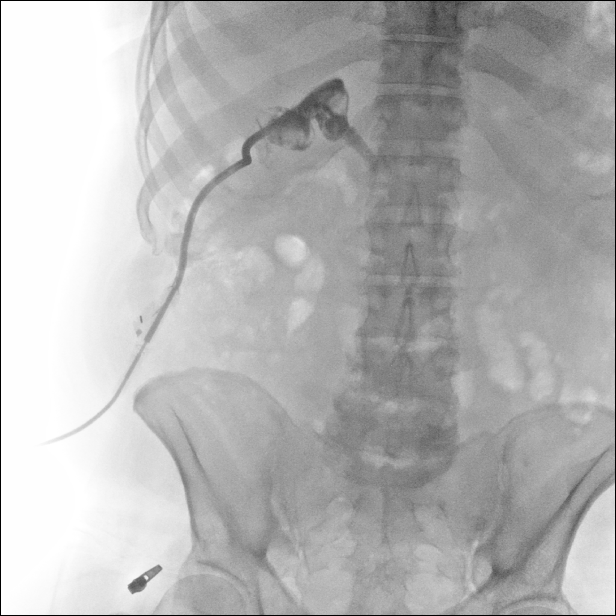

[Series 4: sequence · 2 acquisitions, 6 frames shown (2 of 2)]
[im 1/2]
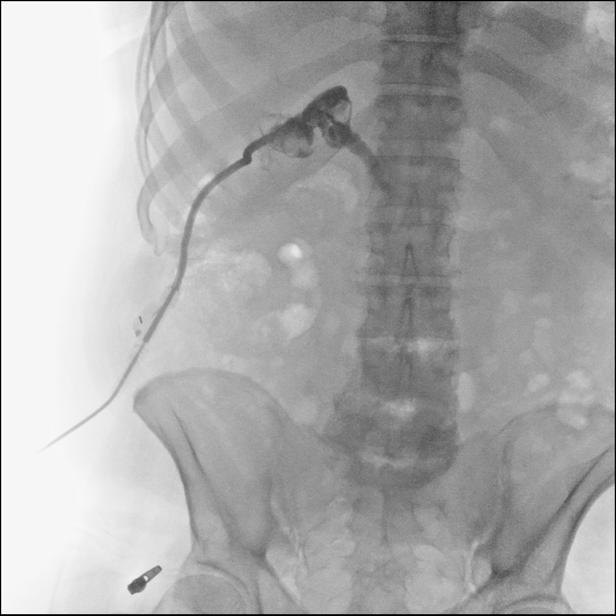
[im 1/2]
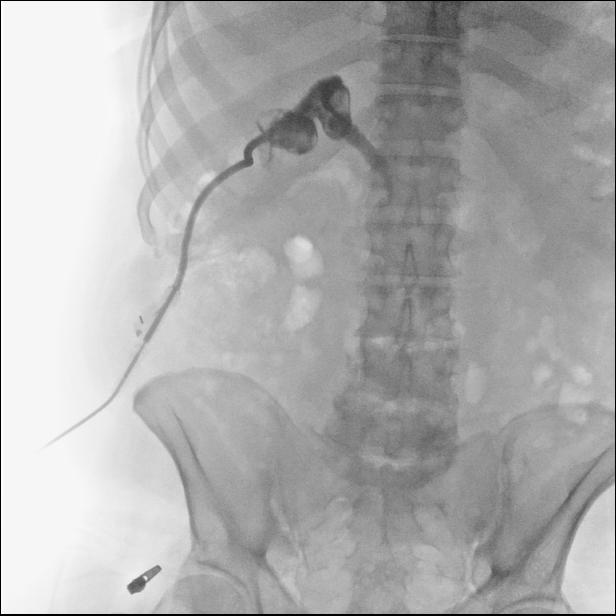
[im 1/2]
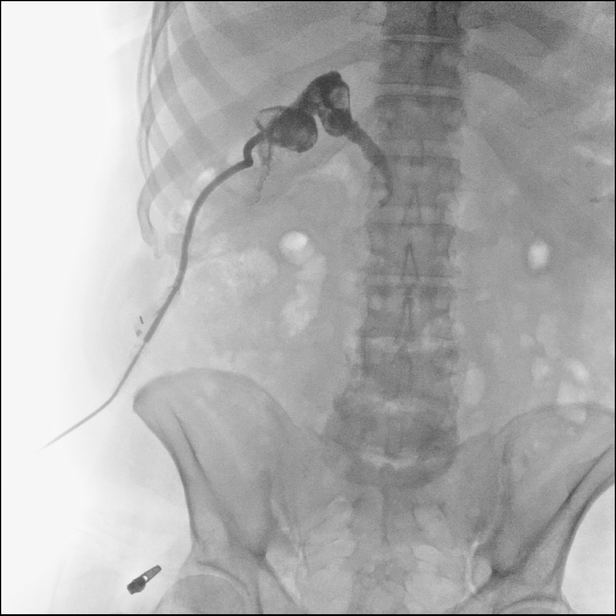
[im 2/2]
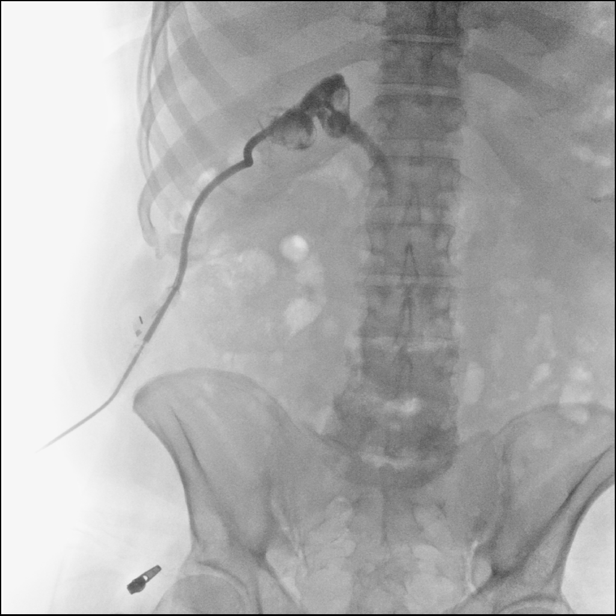
[im 2/2]
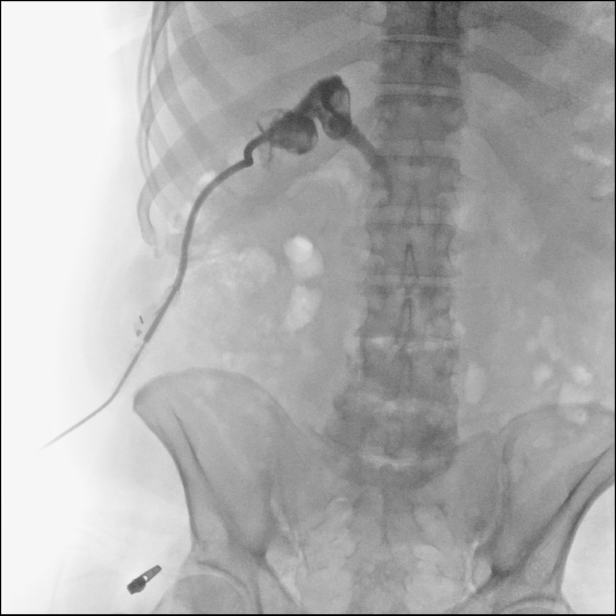
[im 2/2]
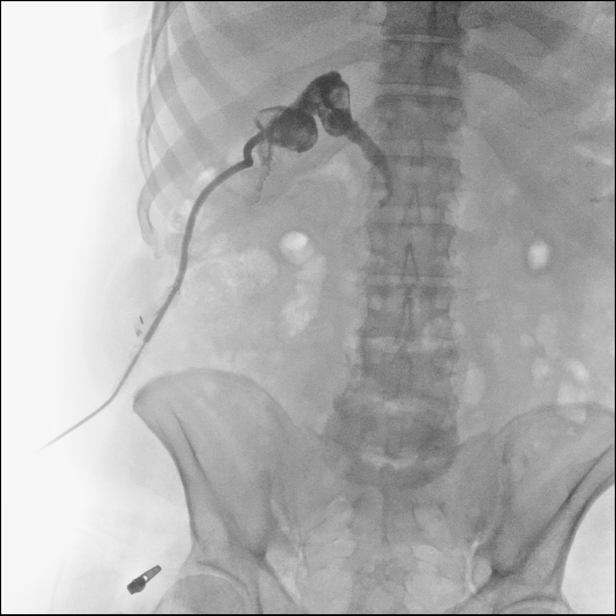

[Series 5: one shot · 1 of 2 slices shown (3 of 3)]
[im 2/2]
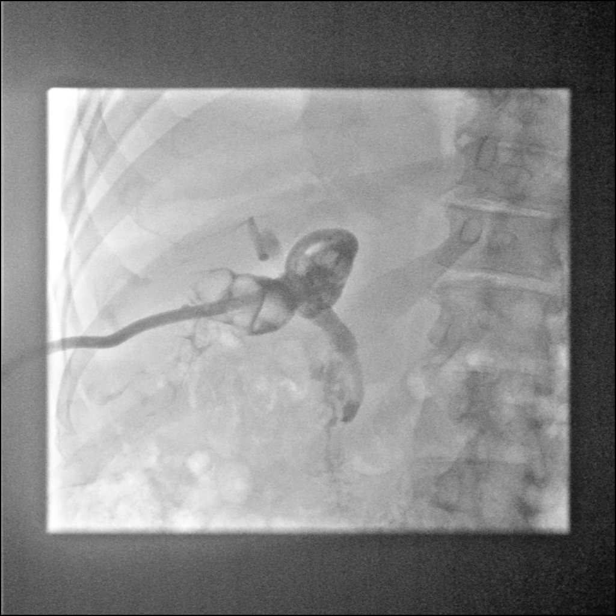

[14 of 20 positions shown; findings below may reference images not displayed]

EXAM:
ABSCESS INJECTION

MEDICATIONS:
None

ANESTHESIA/SEDATION:
None

CONTRAST:  15 mL [id number]

FLUOROSCOPY TIME:  Fluoroscopy Time: 42 seconds.  255 mGy.

COMPLICATIONS:
None.

PROCEDURE:
Contrast was injected through the pre-existing cholecystostomy.
Fluoroscopic spot images were obtained. The catheter was then
reconnected to a gravity drainage bag.
FINDINGS: The cholecystostomy tube is formed within the neck of the
gallbladder. Contrast injection shows multiple filling defects
within the gallbladder lumen consistent with calculi. There is
initial slow and sluggish drainage via the cystic duct into the
common bile duct. No evidence of definite calculi within the common
bile duct. Eventually, contrast does enter the duodenum. No contrast
extravasation identified.
IMPRESSION: Multiple calculi within the gallbladder lumen. Sluggish outflow via
the cystic duct of injected contrast with eventual opacification of
the common bile duct and duodenum.

## 2017-05-03 DIAGNOSIS — E1165 Type 2 diabetes mellitus with hyperglycemia: Secondary | ICD-10-CM | POA: Diagnosis not present

## 2017-06-27 DIAGNOSIS — Z6832 Body mass index (BMI) 32.0-32.9, adult: Secondary | ICD-10-CM | POA: Diagnosis not present

## 2017-06-27 DIAGNOSIS — E1165 Type 2 diabetes mellitus with hyperglycemia: Secondary | ICD-10-CM | POA: Diagnosis not present

## 2017-06-27 DIAGNOSIS — E119 Type 2 diabetes mellitus without complications: Secondary | ICD-10-CM | POA: Diagnosis not present

## 2017-06-27 DIAGNOSIS — Z1389 Encounter for screening for other disorder: Secondary | ICD-10-CM | POA: Diagnosis not present

## 2017-06-27 DIAGNOSIS — I10 Essential (primary) hypertension: Secondary | ICD-10-CM | POA: Diagnosis not present

## 2017-06-27 DIAGNOSIS — Z0000 Encounter for general adult medical examination without abnormal findings: Secondary | ICD-10-CM | POA: Diagnosis not present

## 2017-09-28 DIAGNOSIS — Z23 Encounter for immunization: Secondary | ICD-10-CM | POA: Diagnosis not present

## 2017-12-26 DIAGNOSIS — Z1389 Encounter for screening for other disorder: Secondary | ICD-10-CM | POA: Diagnosis not present

## 2017-12-26 DIAGNOSIS — I10 Essential (primary) hypertension: Secondary | ICD-10-CM | POA: Diagnosis not present

## 2017-12-26 DIAGNOSIS — R000 Tachycardia, unspecified: Secondary | ICD-10-CM | POA: Diagnosis not present

## 2017-12-26 DIAGNOSIS — Z6832 Body mass index (BMI) 32.0-32.9, adult: Secondary | ICD-10-CM | POA: Diagnosis not present

## 2017-12-26 DIAGNOSIS — E119 Type 2 diabetes mellitus without complications: Secondary | ICD-10-CM | POA: Diagnosis not present

## 2017-12-26 DIAGNOSIS — E1165 Type 2 diabetes mellitus with hyperglycemia: Secondary | ICD-10-CM | POA: Diagnosis not present

## 2017-12-26 DIAGNOSIS — Z1331 Encounter for screening for depression: Secondary | ICD-10-CM | POA: Diagnosis not present

## 2017-12-26 DIAGNOSIS — Z0000 Encounter for general adult medical examination without abnormal findings: Secondary | ICD-10-CM | POA: Diagnosis not present

## 2017-12-28 ENCOUNTER — Ambulatory Visit (HOSPITAL_COMMUNITY): Payer: Medicare HMO

## 2018-01-02 ENCOUNTER — Inpatient Hospital Stay (HOSPITAL_COMMUNITY): Admit: 2018-01-02 | Payer: Self-pay

## 2018-01-02 ENCOUNTER — Ambulatory Visit (HOSPITAL_COMMUNITY): Admission: RE | Admit: 2018-01-02 | Payer: Medicare HMO | Source: Ambulatory Visit

## 2018-01-02 DIAGNOSIS — I10 Essential (primary) hypertension: Secondary | ICD-10-CM | POA: Diagnosis not present

## 2018-01-02 DIAGNOSIS — E119 Type 2 diabetes mellitus without complications: Secondary | ICD-10-CM | POA: Diagnosis not present

## 2018-01-10 ENCOUNTER — Other Ambulatory Visit: Payer: Self-pay

## 2018-01-10 ENCOUNTER — Ambulatory Visit (HOSPITAL_COMMUNITY)
Admission: RE | Admit: 2018-01-10 | Discharge: 2018-01-10 | Disposition: A | Discharge status: Home / Self Care | Payer: Medicare HMO | Source: Ambulatory Visit | Attending: Internal Medicine | Admitting: Internal Medicine

## 2018-01-10 DIAGNOSIS — I10 Essential (primary) hypertension: Secondary | ICD-10-CM | POA: Diagnosis not present

## 2018-01-10 DIAGNOSIS — R9431 Abnormal electrocardiogram [ECG] [EKG]: Secondary | ICD-10-CM | POA: Diagnosis not present

## 2018-02-16 ENCOUNTER — Ambulatory Visit: Payer: Medicare HMO | Admitting: Cardiology

## 2018-02-16 ENCOUNTER — Encounter: Payer: Self-pay | Admitting: Cardiology

## 2018-02-16 VITALS — BP 162/94 | HR 106 | Ht 66.0 in | Wt 201.0 lb

## 2018-02-16 DIAGNOSIS — R000 Tachycardia, unspecified: Secondary | ICD-10-CM | POA: Diagnosis not present

## 2018-02-16 DIAGNOSIS — I10 Essential (primary) hypertension: Secondary | ICD-10-CM

## 2018-02-16 NOTE — Patient Instructions (Signed)
Medication Instructions:  Your physician recommends that you continue on your current medications as directed. Please refer to the Current Medication list given to you today.   Labwork: I will request labs from pcp  Testing/Procedures: none Follow-Up: Your physician recommends that you schedule a follow-up appointment in: 3 months    Any Other Special Instructions Will Be Listed Below (If Applicable).  PLEASE KEEP A BLOOD PRESSURE LOG FOR 1 WEEK    If you need a refill on your cardiac medications before your next appointment, please call your pharmacy.   DASH Eating Plan DASH stands for "Dietary Approaches to Stop Hypertension." The DASH eating plan is a healthy eating plan that has been shown to reduce high blood pressure (hypertension). It may also reduce your risk for type 2 diabetes, heart disease, and stroke. The DASH eating plan may also help with weight loss. What are tips for following this plan? General guidelines  Avoid eating more than 2,300 mg (milligrams) of salt (sodium) a day. If you have hypertension, you may need to reduce your sodium intake to 1,500 mg a day.  Limit alcohol intake to no more than 1 drink a day for nonpregnant women and 2 drinks a day for men. One drink equals 12 oz of beer, 5 oz of wine, or 1 oz of hard liquor.  Work with your health care provider to maintain a healthy body weight or to lose weight. Ask what an ideal weight is for you.  Get at least 30 minutes of exercise that causes your heart to beat faster (aerobic exercise) most days of the week. Activities may include walking, swimming, or biking.  Work with your health care provider or diet and nutrition specialist (dietitian) to adjust your eating plan to your individual calorie needs. Reading food labels  Check food labels for the amount of sodium per serving. Choose foods with less than 5 percent of the Daily Value of sodium. Generally, foods with less than 300 mg of sodium per serving  fit into this eating plan.  To find whole grains, look for the word "whole" as the first word in the ingredient list. Shopping  Buy products labeled as "low-sodium" or "no salt added."  Buy fresh foods. Avoid canned foods and premade or frozen meals. Cooking  Avoid adding salt when cooking. Use salt-free seasonings or herbs instead of table salt or sea salt. Check with your health care provider or pharmacist before using salt substitutes.  Do not fry foods. Cook foods using healthy methods such as baking, boiling, grilling, and broiling instead.  Cook with heart-healthy oils, such as olive, canola, soybean, or sunflower oil. Meal planning   Eat a balanced diet that includes: ? 5 or more servings of fruits and vegetables each day. At each meal, try to fill half of your plate with fruits and vegetables. ? Up to 6-8 servings of whole grains each day. ? Less than 6 oz of lean meat, poultry, or fish each day. A 3-oz serving of meat is about the same size as a deck of cards. One egg equals 1 oz. ? 2 servings of low-fat dairy each day. ? A serving of nuts, seeds, or beans 5 times each week. ? Heart-healthy fats. Healthy fats called Omega-3 fatty acids are found in foods such as flaxseeds and coldwater fish, like sardines, salmon, and mackerel.  Limit how much you eat of the following: ? Canned or prepackaged foods. ? Food that is high in trans fat, such as fried foods. ?  Food that is high in saturated fat, such as fatty meat. ? Sweets, desserts, sugary drinks, and other foods with added sugar. ? Full-fat dairy products.  Do not salt foods before eating.  Try to eat at least 2 vegetarian meals each week.  Eat more home-cooked food and less restaurant, buffet, and fast food.  When eating at a restaurant, ask that your food be prepared with less salt or no salt, if possible. What foods are recommended? The items listed may not be a complete list. Talk with your dietitian about what  dietary choices are best for you. Grains Whole-grain or whole-wheat bread. Whole-grain or whole-wheat pasta. Brown rice. [place]ena Morrow. Bulgur. Whole-grain and low-sodium cereals. Pita bread. Low-fat, low-sodium crackers. Whole-wheat flour tortillas. Vegetables Fresh or frozen vegetables (raw, steamed, roasted, or grilled). Low-sodium or reduced-sodium tomato and vegetable juice. Low-sodium or reduced-sodium tomato sauce and tomato paste. Low-sodium or reduced-sodium canned vegetables. Fruits All fresh, dried, or frozen fruit. Canned fruit in natural juice (without added sugar). Meat and other protein foods Skinless chicken or [place]. Ground chicken or [place]. Pork with fat trimmed off. Fish and seafood. Egg whites. Dried beans, peas, or lentils. Unsalted nuts, nut butters, and seeds. Unsalted canned beans. Lean cuts of beef with fat trimmed off. Low-sodium, lean deli meat. Dairy Low-fat (1%) or fat-free (skim) milk. Fat-free, low-fat, or reduced-fat cheeses. Nonfat, low-sodium ricotta or cottage cheese. Low-fat or nonfat yogurt. Low-fat, low-sodium cheese. Fats and oils Soft margarine without trans fats. Vegetable oil. Low-fat, reduced-fat, or light mayonnaise and salad dressings (reduced-sodium). Canola, safflower, olive, soybean, and sunflower oils. Avocado. Seasoning and other foods Herbs. Spices. Seasoning mixes without salt. Unsalted popcorn and pretzels. Fat-free sweets. What foods are not recommended? The items listed may not be a complete list. Talk with your dietitian about what dietary choices are best for you. Grains Baked goods made with fat, such as croissants, muffins, or some breads. Dry pasta or rice meal packs. Vegetables Creamed or fried vegetables. Vegetables in a cheese sauce. Regular canned vegetables (not low-sodium or reduced-sodium). Regular canned tomato sauce and paste (not low-sodium or reduced-sodium). Regular tomato and vegetable juice (not low-sodium or  reduced-sodium). Angie Fava. Olives. Fruits Canned fruit in a light or heavy syrup. Fried fruit. Fruit in cream or butter sauce. Meat and other protein foods Fatty cuts of meat. Ribs. Fried meat. Berniece Salines. Sausage. Bologna and other processed lunch meats. Salami. Fatback. Hotdogs. Bratwurst. Salted nuts and seeds. Canned beans with added salt. Canned or smoked fish. Whole eggs or egg yolks. Chicken or [place] with skin. Dairy Whole or 2% milk, cream, and half-and-half. Whole or full-fat cream cheese. Whole-fat or sweetened yogurt. Full-fat cheese. Nondairy creamers. Whipped toppings. Processed cheese and cheese spreads. Fats and oils Butter. Stick margarine. Lard. Shortening. Ghee. Bacon fat. Tropical oils, such as coconut, palm kernel, or palm oil. Seasoning and other foods Salted popcorn and pretzels. Onion salt, garlic salt, seasoned salt, table salt, and sea salt. Worcestershire sauce. Tartar sauce. Barbecue sauce. Teriyaki sauce. Soy sauce, including reduced-sodium. Steak sauce. Canned and packaged gravies. Fish sauce. Oyster sauce. Cocktail sauce. Horseradish that you find on the shelf. Ketchup. Mustard. Meat flavorings and tenderizers. Bouillon cubes. Hot sauce and Tabasco sauce. Premade or packaged marinades. Premade or packaged taco seasonings. Relishes. Regular salad dressings. Where to find more information:  National Heart, Lung, and [place]: https://wilson-eaton.com/  American Heart Association: www.heart.org Summary  The DASH eating plan is a healthy eating plan that has been shown to reduce high blood  pressure (hypertension). It may also reduce your risk for type 2 diabetes, heart disease, and stroke.  With the DASH eating plan, you should limit salt (sodium) intake to 2,300 mg a day. If you have hypertension, you may need to reduce your sodium intake to 1,500 mg a day.  When on the DASH eating plan, aim to eat more fresh fruits and vegetables, whole grains, lean proteins, low-fat  dairy, and heart-healthy fats.  Work with your health care provider or diet and nutrition specialist (dietitian) to adjust your eating plan to your individual calorie needs. This information is not intended to replace advice given to you by your health care provider. Make sure you discuss any questions you have with your health care provider. Document Released: 10/28/2011 Document Revised: 11/01/2016 Document Reviewed: 11/01/2016 Elsevier Interactive Patient Education  [employer].

## 2018-02-16 NOTE — Progress Notes (Signed)
Clinical Summary Stephen Rogers is a 77 y.o.male seen as new consult, referred by Dr Legrand Rams for elevated heart rates.   1. Tachycardia - he reports history of elevated heart rates since he was a teenager.  - no palpitations. No chest pain, no SOB. Sedentary lifestyle due to recent gallbladder illness he reports.  - EKG review shows chronic mild sinus tach to 109 at least since 2013   2. HTN - follwed by pcp - compliant with meds   Past Medical History:  Diagnosis Date  . CKD (chronic kidney disease), stage III   . Diabetes mellitus   . DVT (deep venous thrombosis) ([place] [employer]) 10/14/2016  . Extremity edema 09/2016  . Hyperlipemia   . Hypertension      No Known Allergies   Current Outpatient Medications  Medication Sig Dispense Refill  . apixaban (ELIQUIS) 5 MG TABS tablet Take 1 tablet (5 mg total) by mouth 2 (two) times daily. 60 tablet 0  . aspirin EC 81 MG tablet Take 81 mg by mouth every morning.     . furosemide (LASIX) 20 MG tablet Take 10 mg by mouth daily as needed for edema or fluid.    Marland Kitchen glipiZIDE (GLUCOTROL) 10 MG tablet Take 10 mg by mouth daily before breakfast.     . hydrALAZINE (APRESOLINE) 25 MG tablet Take 25 mg by mouth every evening.     . metFORMIN (GLUCOPHAGE) 1000 MG tablet Take 1,000 mg by mouth every evening.     . simvastatin (ZOCOR) 40 MG tablet Take 40 mg by mouth every evening.     . verapamil (CALAN-SR) 240 MG CR tablet Take 240 mg by mouth at bedtime.     No current facility-administered medications for this visit.      Past Surgical History:  Procedure Laterality Date  . COLONOSCOPY    . IR GENERIC HISTORICAL  10/19/2016   IR PERC CHOLECYSTOSTOMY 10/19/2016 [name], MD [facility]  . IR GENERIC HISTORICAL  10/25/2016   IR CATHETER TUBE CHANGE 10/25/2016 [name], MD [facility]  . IR GENERIC HISTORICAL  11/30/2016   IR RADIOLOGIST EVAL & MGMT 11/30/2016 [facility] INTERV RAD     No Known Allergies    Family History  Problem  Relation Age of Onset  . Heart attack Other   . Tuberculosis Other   . Tuberculosis Other   . Diabetes type II Father   . Hypertension Father   . Heart attack Maternal Aunt      Social History Stephen Rogers reports that he has never smoked. He has never used smokeless tobacco. Stephen Rogers reports that he does not drink alcohol.   Review of Systems CONSTITUTIONAL: No weight loss, fever, chills, weakness or fatigue.  HEENT: Eyes: No visual loss, blurred vision, double vision or yellow sclerae.No hearing loss, sneezing, congestion, runny nose or sore throat.  SKIN: No rash or itching.  CARDIOVASCULAR: per hpi RESPIRATORY: per hpi GASTROINTESTINAL: No anorexia, nausea, vomiting or diarrhea. No abdominal pain or blood.  GENITOURINARY: No burning on urination, no polyuria NEUROLOGICAL: No headache, dizziness, syncope, paralysis, ataxia, numbness or tingling in the extremities. No change in bowel or bladder control.  MUSCULOSKELETAL: No muscle, back pain, joint pain or stiffness.  LYMPHATICS: No enlarged nodes. No history of splenectomy.  PSYCHIATRIC: No history of depression or anxiety.  ENDOCRINOLOGIC: No reports of sweating, cold or heat intolerance. No polyuria or polydipsia.  Marland Kitchen   Physical Examination Vitals:   02/16/18 0263 02/16/18 7858  BP: (!) 152/90 (!) 162/94  Pulse:  (!) 106  SpO2:  96%   Vitals:   02/16/18 0827  Weight: 201 lb (91.2 kg)  Height: 5\' 6"  (1.676 m)    Gen: resting comfortably, no acute distress HEENT: no scleral icterus, pupils equal round and reactive, no palptable cervical adenopathy,  CV: regular, rate 105, no mr/g, no jvd Resp: Clear to auscultation bilaterally GI: abdomen is soft, non-tender, non-distended, normal bowel sounds, no hepatosplenomegaly MSK: extremities are warm, no edema.  Skin: warm, no rash Neuro:  no focal deficits Psych: appropriate affect   Diagnostic Studies  09/2016 echo Study Conclusions  - Left ventricle: The  cavity size was normal. Wall thickness was   increased in a pattern of mild LVH. Systolic function was   vigorous. The estimated ejection fraction was in the range of 65%   to 70%. Wall motion was normal; there were no regional wall   motion abnormalities. Doppler parameters are consistent with   abnormal left ventricular relaxation (grade 1 diastolic   dysfunction). - Aortic valve: Mildly calcified annulus. Trileaflet. - Mitral valve: There was trivial regurgitation. - Left atrium: The atrium was mildly dilated. - Right atrium: Central venous pressure (est): 3 mm Hg. - Atrial septum: No defect or patent foramen ovale was identified. - Tricuspid valve: There was mild regurgitation. - Pulmonary arteries: PA peak pressure: 35 mm Hg (S). - Pericardium, extracardiac: There was no pericardial effusion.  Impressions:  - Mild LVH with sigmoid-shaped basal septum and LVEF 65-70%. Grade   1 diastolic dysfunction. Mild left atrial enlargement. Trivial   mitral regurgitation. Mild calcified aortic annulus. Mild   tricuspid regurgitation with PASP 35 mmHg.    Assessment and Plan  1. Sinus tachycardia - EKG shows mild sinus tachycardia to day. From EKG review th is stable at least since 2013, patient himself reports a history of elevated heart rates since he was a teen.  - no significant symptoms to suggest any pathology - in setting of chronic mild sinus tach in absence of symptoms would not pursue any further testing at this time  2. HTN - elevated in clinic. He will submit bp log in 1 week. He is already on multiple bp agents. Would consider stopping lasix and starting chlorthalidone pending bp lg results.  - given info on DASH diet    F/u 3 months   Arnoldo Lenis, M.D.

## 2018-02-21 ENCOUNTER — Encounter: Payer: Self-pay | Admitting: Cardiology

## 2018-06-07 ENCOUNTER — Ambulatory Visit: Payer: Medicare HMO | Admitting: Cardiology

## 2018-06-26 DIAGNOSIS — N183 Chronic kidney disease, stage 3 (moderate): Secondary | ICD-10-CM | POA: Diagnosis not present

## 2018-06-26 DIAGNOSIS — Z6832 Body mass index (BMI) 32.0-32.9, adult: Secondary | ICD-10-CM | POA: Diagnosis not present

## 2018-06-26 DIAGNOSIS — I10 Essential (primary) hypertension: Secondary | ICD-10-CM | POA: Diagnosis not present

## 2018-06-26 DIAGNOSIS — E1165 Type 2 diabetes mellitus with hyperglycemia: Secondary | ICD-10-CM | POA: Diagnosis not present

## 2018-06-27 ENCOUNTER — Other Ambulatory Visit: Payer: Self-pay | Admitting: Internal Medicine

## 2018-06-27 DIAGNOSIS — I739 Peripheral vascular disease, unspecified: Secondary | ICD-10-CM

## 2018-06-30 ENCOUNTER — Ambulatory Visit (HOSPITAL_COMMUNITY): Admission: RE | Admit: 2018-06-30 | Payer: Medicare HMO | Source: Ambulatory Visit

## 2018-07-21 ENCOUNTER — Ambulatory Visit: Payer: Medicare HMO | Admitting: Cardiology

## 2018-07-28 ENCOUNTER — Ambulatory Visit (HOSPITAL_COMMUNITY): Admission: RE | Admit: 2018-07-28 | Payer: Medicare HMO | Source: Ambulatory Visit

## 2018-08-29 ENCOUNTER — Ambulatory Visit (HOSPITAL_COMMUNITY)
Admission: RE | Admit: 2018-08-29 | Discharge: 2018-08-29 | Disposition: A | Discharge status: Home / Self Care | Payer: Medicare HMO | Source: Ambulatory Visit | Attending: Internal Medicine | Admitting: Internal Medicine

## 2018-08-29 DIAGNOSIS — I739 Peripheral vascular disease, unspecified: Secondary | ICD-10-CM | POA: Insufficient documentation

## 2018-08-31 ENCOUNTER — Encounter: Payer: Self-pay | Admitting: Cardiology

## 2018-08-31 ENCOUNTER — Ambulatory Visit: Payer: Medicare HMO | Admitting: Cardiology

## 2018-08-31 VITALS — BP 198/110 | HR 104 | Ht 66.0 in | Wt 199.0 lb

## 2018-08-31 DIAGNOSIS — Z23 Encounter for immunization: Secondary | ICD-10-CM | POA: Diagnosis not present

## 2018-08-31 DIAGNOSIS — I10 Essential (primary) hypertension: Secondary | ICD-10-CM | POA: Diagnosis not present

## 2018-08-31 DIAGNOSIS — Z79899 Other long term (current) drug therapy: Secondary | ICD-10-CM | POA: Diagnosis not present

## 2018-08-31 DIAGNOSIS — R000 Tachycardia, unspecified: Secondary | ICD-10-CM | POA: Diagnosis not present

## 2018-08-31 MED ORDER — CHLORTHALIDONE 25 MG PO TABS: 25.0000 mg | ORAL_TABLET | Freq: Every day | ORAL | 3 refills | Status: DC

## 2018-08-31 MED ORDER — HYDRALAZINE HCL 100 MG PO TABS: 100.0000 mg | ORAL_TABLET | Freq: Two times a day (BID) | ORAL | 3 refills | Status: DC

## 2018-08-31 MED ORDER — HYDRALAZINE HCL 100 MG PO TABS: 100.0000 mg | ORAL_TABLET | Freq: Three times a day (TID) | ORAL | 3 refills | Status: DC

## 2018-08-31 NOTE — Patient Instructions (Signed)
Medication Instructions:  STOP LASIX  START CHLORTHALIDONE 25 MG DAILY  INCREASE HYDRALAZINE 100 MG THREE TIMES DAILY   Labwork: 2 WEEKS  BMET MAGNESIUM  Testing/Procedures: NONE  Follow-Up: Your physician wants you to follow-up in: 6 MONTHS.  You will receive a reminder letter in the mail two months in advance. If you don't receive a letter, please call our office to schedule the follow-up appointment.   Any Other Special Instructions Will Be Listed Below (If Applicable).  DASH Eating Plan DASH stands for "Dietary Approaches to Stop Hypertension." The DASH eating plan is a healthy eating plan that has been shown to reduce high blood pressure (hypertension). It may also reduce your risk for type 2 diabetes, heart disease, and stroke. The DASH eating plan may also help with weight loss. What are tips for following this plan? General guidelines  Avoid eating more than 2,300 mg (milligrams) of salt (sodium) a day. If you have hypertension, you may need to reduce your sodium intake to 1,500 mg a day.  Limit alcohol intake to no more than 1 drink a day for nonpregnant women and 2 drinks a day for men. One drink equals 12 oz of beer, 5 oz of wine, or 1 oz of hard liquor.  Work with your health care provider to maintain a healthy body weight or to lose weight. Ask what an ideal weight is for you.  Get at least 30 minutes of exercise that causes your heart to beat faster (aerobic exercise) most days of the week. Activities may include walking, swimming, or biking.  Work with your health care provider or diet and nutrition specialist (dietitian) to adjust your eating plan to your individual calorie needs. Reading food labels  Check food labels for the amount of sodium per serving. Choose foods with less than 5 percent of the Daily Value of sodium. Generally, foods with less than 300 mg of sodium per serving fit into this eating plan.  To find whole grains, look for the word "whole" as  the first word in the ingredient list. Shopping  Buy products labeled as "low-sodium" or "no salt added."  Buy fresh foods. Avoid canned foods and premade or frozen meals. Cooking  Avoid adding salt when cooking. Use salt-free seasonings or herbs instead of table salt or sea salt. Check with your health care provider or pharmacist before using salt substitutes.  Do not fry foods. Cook foods using healthy methods such as baking, boiling, grilling, and broiling instead.  Cook with heart-healthy oils, such as olive, canola, soybean, or sunflower oil. Meal planning   Eat a balanced diet that includes: ? 5 or more servings of fruits and vegetables each day. At each meal, try to fill half of your plate with fruits and vegetables. ? Up to 6-8 servings of whole grains each day. ? Less than 6 oz of lean meat, poultry, or fish each day. A 3-oz serving of meat is about the same size as a deck of cards. One egg equals 1 oz. ? 2 servings of low-fat dairy each day. ? A serving of nuts, seeds, or beans 5 times each week. ? Heart-healthy fats. Healthy fats called Omega-3 fatty acids are found in foods such as flaxseeds and coldwater fish, like sardines, salmon, and mackerel.  Limit how much you eat of the following: ? Canned or prepackaged foods. ? Food that is high in trans fat, such as fried foods. ? Food that is high in saturated fat, such as fatty meat. ?  Sweets, desserts, sugary drinks, and other foods with added sugar. ? Full-fat dairy products.  Do not salt foods before eating.  Try to eat at least 2 vegetarian meals each week.  Eat more home-cooked food and less restaurant, buffet, and fast food.  When eating at a restaurant, ask that your food be prepared with less salt or no salt, if possible. What foods are recommended? The items listed may not be a complete list. Talk with your dietitian about what dietary choices are best for you. Grains Whole-grain or whole-wheat bread.  Whole-grain or whole-wheat pasta. Brown rice. [place]. Bulgur. Whole-grain and low-sodium cereals. Pita bread. Low-fat, low-sodium crackers. Whole-wheat flour tortillas. Vegetables Fresh or frozen vegetables (raw, steamed, roasted, or grilled). Low-sodium or reduced-sodium tomato and vegetable juice. Low-sodium or reduced-sodium tomato sauce and tomato paste. Low-sodium or reduced-sodium canned vegetables. Fruits All fresh, dried, or frozen fruit. Canned fruit in natural juice (without added sugar). Meat and other protein foods Skinless chicken or [place]. Ground chicken or [place]. Pork with fat trimmed off. Fish and seafood. Egg whites. Dried beans, peas, or lentils. Unsalted nuts, nut butters, and seeds. Unsalted canned beans. Lean cuts of beef with fat trimmed off. Low-sodium, lean deli meat. Dairy Low-fat (1%) or fat-free (skim) milk. Fat-free, low-fat, or reduced-fat cheeses. Nonfat, low-sodium ricotta or cottage cheese. Low-fat or nonfat yogurt. Low-fat, low-sodium cheese. Fats and oils Soft margarine without trans fats. Vegetable oil. Low-fat, reduced-fat, or light mayonnaise and salad dressings (reduced-sodium). Canola, safflower, olive, soybean, and sunflower oils. Avocado. Seasoning and other foods Herbs. Spices. Seasoning mixes without salt. Unsalted popcorn and pretzels. Fat-free sweets. What foods are not recommended? The items listed may not be a complete list. Talk with your dietitian about what dietary choices are best for you. Grains Baked goods made with fat, such as croissants, muffins, or some breads. Dry pasta or rice meal packs. Vegetables Creamed or fried vegetables. Vegetables in a cheese sauce. Regular canned vegetables (not low-sodium or reduced-sodium). Regular canned tomato sauce and paste (not low-sodium or reduced-sodium). Regular tomato and vegetable juice (not low-sodium or reduced-sodium). Angie Fava. Olives. Fruits Canned fruit in a light or heavy syrup.  Fried fruit. Fruit in cream or butter sauce. Meat and other protein foods Fatty cuts of meat. Ribs. Fried meat. Berniece Salines. Sausage. Bologna and other processed lunch meats. Salami. Fatback. Hotdogs. Bratwurst. Salted nuts and seeds. Canned beans with added salt. Canned or smoked fish. Whole eggs or egg yolks. Chicken or [place] with skin. Dairy Whole or 2% milk, cream, and half-and-half. Whole or full-fat cream cheese. Whole-fat or sweetened yogurt. Full-fat cheese. Nondairy creamers. Whipped toppings. Processed cheese and cheese spreads. Fats and oils Butter. Stick margarine. Lard. Shortening. Ghee. Bacon fat. Tropical oils, such as coconut, palm kernel, or palm oil. Seasoning and other foods Salted popcorn and pretzels. Onion salt, garlic salt, seasoned salt, table salt, and sea salt. Worcestershire sauce. Tartar sauce. Barbecue sauce. Teriyaki sauce. Soy sauce, including reduced-sodium. Steak sauce. Canned and packaged gravies. Fish sauce. Oyster sauce. Cocktail sauce. Horseradish that you find on the shelf. Ketchup. Mustard. Meat flavorings and tenderizers. Bouillon cubes. Hot sauce and Tabasco sauce. Premade or packaged marinades. Premade or packaged taco seasonings. Relishes. Regular salad dressings. Where to find more information:  National Heart, Lung, and [place]: https://wilson-eaton.com/  American Heart Association: www.heart.org Summary  The DASH eating plan is a healthy eating plan that has been shown to reduce high blood pressure (hypertension). It may also reduce your risk for type 2 diabetes,  heart disease, and stroke.  With the DASH eating plan, you should limit salt (sodium) intake to 2,300 mg a day. If you have hypertension, you may need to reduce your sodium intake to 1,500 mg a day.  When on the DASH eating plan, aim to eat more fresh fruits and vegetables, whole grains, lean proteins, low-fat dairy, and heart-healthy fats.  Work with your health care provider or diet and  nutrition specialist (dietitian) to adjust your eating plan to your individual calorie needs. This information is not intended to replace advice given to you by your health care provider. Make sure you discuss any questions you have with your health care provider. Document Released: 10/28/2011 Document Revised: 11/01/2016 Document Reviewed: 11/01/2016 Elsevier Interactive Patient Education  [employer].      If you need a refill on your cardiac medications before your next appointment, please call your pharmacy.

## 2018-08-31 NOTE — Progress Notes (Signed)
Clinical Summary Mr. Foot is a 77 y.o.male seen today for follow up of the following medical problems.    1. Tachycardia - he reports history of elevated heart rates since he was a teenager.  - no palpitations. No chest pain, no SOB. Sedentary lifestyle due to recent gallbladder illness he reports.  - EKG review shows chronic mild sinus tach to 109 at least since 2013  - no recent palpitations.    2. HTN - does not check at home.  - compliant with meds  Past Medical History:  Diagnosis Date  . CKD (chronic kidney disease), stage III ([place])   . Diabetes mellitus   . DVT (deep venous thrombosis) ([place]) 10/14/2016  . Extremity edema 09/2016  . Hyperlipemia   . Hypertension      No Known Allergies   Current Outpatient Medications  Medication Sig Dispense Refill  . aspirin EC 81 MG tablet Take 81 mg by mouth every morning.     Marland Kitchen atorvastatin (LIPITOR) 40 MG tablet Take 40 mg by mouth daily.    . furosemide (LASIX) 20 MG tablet Take 10 mg by mouth daily as needed for edema or fluid.    Marland Kitchen glipiZIDE (GLUCOTROL) 10 MG tablet Take 10 mg by mouth daily before breakfast.     . hydrALAZINE (APRESOLINE) 100 MG tablet Take 100 mg by mouth 2 (two) times daily.    Marland Kitchen lisinopril (PRINIVIL,ZESTRIL) 40 MG tablet Take 40 mg by mouth daily.    . metFORMIN (GLUCOPHAGE) 1000 MG tablet Take 1,000 mg by mouth every evening.     Marland Kitchen spironolactone (ALDACTONE) 25 MG tablet Take 25 mg by mouth daily.    . verapamil (CALAN-SR) 240 MG CR tablet Take 240 mg by mouth at bedtime.     No current facility-administered medications for this visit.      Past Surgical History:  Procedure Laterality Date  . COLONOSCOPY    . IR GENERIC HISTORICAL  10/19/2016   IR PERC CHOLECYSTOSTOMY 10/19/2016 [name], MD [facility]  . IR GENERIC HISTORICAL  10/25/2016   IR CATHETER TUBE CHANGE 10/25/2016 [name], MD [facility]  . IR GENERIC HISTORICAL  11/30/2016   IR RADIOLOGIST EVAL & MGMT  11/30/2016 [facility]     No Known Allergies    Family History  Problem Relation Age of Onset  . Heart attack Other   . Tuberculosis Other   . Tuberculosis Other   . Diabetes type II Father   . Hypertension Father   . Heart attack Maternal Aunt      Social History Mr. Helman reports that he has never smoked. He has never used smokeless tobacco. Mr. Malina reports that he does not drink alcohol.   Review of Systems CONSTITUTIONAL: No weight loss, fever, chills, weakness or fatigue.  HEENT: Eyes: No visual loss, blurred vision, double vision or yellow sclerae.No hearing loss, sneezing, congestion, runny nose or sore throat.  SKIN: No rash or itching.  CARDIOVASCULAR: no chest pain, no palpitations.  RESPIRATORY: No shortness of breath, cough or sputum.  GASTROINTESTINAL: No anorexia, nausea, vomiting or diarrhea. No abdominal pain or blood.  GENITOURINARY: No burning on urination, no polyuria NEUROLOGICAL: No headache, dizziness, syncope, paralysis, ataxia, numbness or tingling in the extremities. No change in bowel or bladder control.  MUSCULOSKELETAL: No muscle, back pain, joint pain or stiffness.  LYMPHATICS: No enlarged nodes. No history of splenectomy.  PSYCHIATRIC: No history of depression or anxiety.  ENDOCRINOLOGIC: No reports  of sweating, cold or heat intolerance. No polyuria or polydipsia.  Marland Kitchen   Physical Examination Vitals:   08/31/18 1040  BP: (!) 198/110  Pulse: (!) 104  SpO2: 97%   Vitals:   08/31/18 1040  Weight: 199 lb (90.3 kg)  Height: 5\' 6"  (1.676 m)    Gen: resting comfortably, no acute distress HEENT: no scleral icterus, pupils equal round and reactive, no palptable cervical adenopathy,  CV: RRR, no m/r/g, no jvd Resp: Clear to auscultation bilaterally GI: abdomen is soft, non-tender, non-distended, normal bowel sounds, no hepatosplenomegaly MSK: extremities are warm, no edema.  Skin: warm, no rash Neuro:  no focal deficits Psych:  appropriate affect   Diagnostic Studies 09/2016 echo Study Conclusions  - Left ventricle: The cavity size was normal. Wall thickness was increased in a pattern of mild LVH. Systolic function was vigorous. The estimated ejection fraction was in the range of 65% to 70%. Wall motion was normal; there were no regional wall motion abnormalities. Doppler parameters are consistent with abnormal left ventricular relaxation (grade 1 diastolic dysfunction). - Aortic valve: Mildly calcified annulus. Trileaflet. - Mitral valve: There was trivial regurgitation. - Left atrium: The atrium was mildly dilated. - Right atrium: Central venous pressure (est): 3 mm Hg. - Atrial septum: No defect or patent foramen ovale was identified. - Tricuspid valve: There was mild regurgitation. - Pulmonary arteries: PA peak pressure: 35 mm Hg (S). - Pericardium, extracardiac: There was no pericardial effusion.  Impressions:  - Mild LVH with sigmoid-shaped basal septum and LVEF 65-70%. Grade 1 diastolic dysfunction. Mild left atrial enlargement. Trivial mitral regurgitation. Mild calcified aortic annulus. Mild tricuspid regurgitation with PASP 35 mmHg.    Assessment and Plan  1. Sinus tachycardia - EKG shows mild sinus tachycardia to day. From EKG review th is stable at least since 2013, patient himself reports a history of elevated heart rates since he was a teen.  - no further workup indicated  2. HTN - uncontrolled, manual recheck 190/100. We will stop lasix, start chlorthalidone 25mg  daily and change his hydralazine to 100mg  tid. Nursing visit 2 weeks for bp check, recheck BMET/Mg in 2 weeks   F/u 6 months   Arnoldo Lenis, M.D.

## 2018-09-13 ENCOUNTER — Ambulatory Visit (INDEPENDENT_AMBULATORY_CARE_PROVIDER_SITE_OTHER): Payer: Medicare HMO

## 2018-09-13 VITALS — BP 152/78 | HR 89 | Ht 66.0 in | Wt 202.0 lb

## 2018-09-13 DIAGNOSIS — I10 Essential (primary) hypertension: Secondary | ICD-10-CM

## 2018-09-13 NOTE — Progress Notes (Signed)
Pt came in for blood pressure check. He stated that he is feeling good. He went to the [facility] fair yesterday and did a lot of walking. He said it made his legs sore, but he knew it was good exercise for him. He is taking all medications as prescribed.

## 2018-09-14 ENCOUNTER — Ambulatory Visit: Payer: Medicare HMO

## 2018-09-14 NOTE — Progress Notes (Signed)
BP much improved but still elevated. We will see how his blood work looks before making any changes. Be sure to remind him to have done   Zandra Abts MD

## 2018-09-26 DIAGNOSIS — E119 Type 2 diabetes mellitus without complications: Secondary | ICD-10-CM | POA: Diagnosis not present

## 2018-09-26 DIAGNOSIS — E1165 Type 2 diabetes mellitus with hyperglycemia: Secondary | ICD-10-CM | POA: Diagnosis not present

## 2018-09-26 DIAGNOSIS — Z0000 Encounter for general adult medical examination without abnormal findings: Secondary | ICD-10-CM | POA: Diagnosis not present

## 2018-09-26 DIAGNOSIS — I10 Essential (primary) hypertension: Secondary | ICD-10-CM | POA: Diagnosis not present

## 2018-12-27 DIAGNOSIS — E1165 Type 2 diabetes mellitus with hyperglycemia: Secondary | ICD-10-CM | POA: Diagnosis not present

## 2018-12-27 DIAGNOSIS — Z1331 Encounter for screening for depression: Secondary | ICD-10-CM | POA: Diagnosis not present

## 2018-12-27 DIAGNOSIS — Z0001 Encounter for general adult medical examination with abnormal findings: Secondary | ICD-10-CM | POA: Diagnosis not present

## 2018-12-27 DIAGNOSIS — Z1389 Encounter for screening for other disorder: Secondary | ICD-10-CM | POA: Diagnosis not present

## 2018-12-27 DIAGNOSIS — Z6832 Body mass index (BMI) 32.0-32.9, adult: Secondary | ICD-10-CM | POA: Diagnosis not present

## 2018-12-27 DIAGNOSIS — E1010 Type 1 diabetes mellitus with ketoacidosis without coma: Secondary | ICD-10-CM | POA: Diagnosis not present

## 2018-12-27 DIAGNOSIS — Z0000 Encounter for general adult medical examination without abnormal findings: Secondary | ICD-10-CM | POA: Diagnosis not present

## 2018-12-27 DIAGNOSIS — I10 Essential (primary) hypertension: Secondary | ICD-10-CM | POA: Diagnosis not present

## 2018-12-27 DIAGNOSIS — E119 Type 2 diabetes mellitus without complications: Secondary | ICD-10-CM | POA: Diagnosis not present

## 2018-12-29 DIAGNOSIS — E119 Type 2 diabetes mellitus without complications: Secondary | ICD-10-CM | POA: Diagnosis not present

## 2019-04-18 ENCOUNTER — Encounter (HOSPITAL_COMMUNITY): Payer: Self-pay | Admitting: Emergency Medicine

## 2019-04-18 ENCOUNTER — Other Ambulatory Visit: Payer: Self-pay

## 2019-04-18 ENCOUNTER — Inpatient Hospital Stay (HOSPITAL_COMMUNITY)
Admission: EM | Admit: 2019-04-18 | Discharge: 2019-05-03 | DRG: 330 | Disposition: A | Discharge status: Skilled Nursing Facility | Payer: Medicare HMO | Attending: Family Medicine | Admitting: Family Medicine

## 2019-04-18 DIAGNOSIS — M1712 Unilateral primary osteoarthritis, left knee: Secondary | ICD-10-CM | POA: Diagnosis present

## 2019-04-18 DIAGNOSIS — R000 Tachycardia, unspecified: Secondary | ICD-10-CM | POA: Diagnosis not present

## 2019-04-18 DIAGNOSIS — J9809 Other diseases of bronchus, not elsewhere classified: Secondary | ICD-10-CM | POA: Diagnosis not present

## 2019-04-18 DIAGNOSIS — J9811 Atelectasis: Secondary | ICD-10-CM | POA: Diagnosis not present

## 2019-04-18 DIAGNOSIS — E785 Hyperlipidemia, unspecified: Secondary | ICD-10-CM | POA: Diagnosis present

## 2019-04-18 DIAGNOSIS — R918 Other nonspecific abnormal finding of lung field: Secondary | ICD-10-CM | POA: Diagnosis not present

## 2019-04-18 DIAGNOSIS — N179 Acute kidney failure, unspecified: Secondary | ICD-10-CM

## 2019-04-18 DIAGNOSIS — K625 Hemorrhage of anus and rectum: Secondary | ICD-10-CM | POA: Diagnosis present

## 2019-04-18 DIAGNOSIS — E876 Hypokalemia: Secondary | ICD-10-CM | POA: Diagnosis not present

## 2019-04-18 DIAGNOSIS — Z5320 Procedure and treatment not carried out because of patient's decision for unspecified reasons: Secondary | ICD-10-CM | POA: Diagnosis not present

## 2019-04-18 DIAGNOSIS — Z86010 Personal history of colonic polyps: Secondary | ICD-10-CM

## 2019-04-18 DIAGNOSIS — K921 Melena: Secondary | ICD-10-CM | POA: Diagnosis present

## 2019-04-18 DIAGNOSIS — K922 Gastrointestinal hemorrhage, unspecified: Secondary | ICD-10-CM | POA: Diagnosis present

## 2019-04-18 DIAGNOSIS — Z1159 Encounter for screening for other viral diseases: Secondary | ICD-10-CM

## 2019-04-18 DIAGNOSIS — E875 Hyperkalemia: Secondary | ICD-10-CM | POA: Diagnosis not present

## 2019-04-18 DIAGNOSIS — I129 Hypertensive chronic kidney disease with stage 1 through stage 4 chronic kidney disease, or unspecified chronic kidney disease: Secondary | ICD-10-CM | POA: Diagnosis present

## 2019-04-18 DIAGNOSIS — K661 Hemoperitoneum: Secondary | ICD-10-CM | POA: Diagnosis not present

## 2019-04-18 DIAGNOSIS — K5730 Diverticulosis of large intestine without perforation or abscess without bleeding: Secondary | ICD-10-CM | POA: Diagnosis present

## 2019-04-18 DIAGNOSIS — K632 Fistula of intestine: Principal | ICD-10-CM | POA: Diagnosis present

## 2019-04-18 DIAGNOSIS — D175 Benign lipomatous neoplasm of intra-abdominal organs: Secondary | ICD-10-CM | POA: Diagnosis not present

## 2019-04-18 DIAGNOSIS — M25462 Effusion, left knee: Secondary | ICD-10-CM | POA: Diagnosis not present

## 2019-04-18 DIAGNOSIS — D62 Acute posthemorrhagic anemia: Secondary | ICD-10-CM | POA: Diagnosis present

## 2019-04-18 DIAGNOSIS — E1129 Type 2 diabetes mellitus with other diabetic kidney complication: Secondary | ICD-10-CM

## 2019-04-18 DIAGNOSIS — D127 Benign neoplasm of rectosigmoid junction: Secondary | ICD-10-CM | POA: Diagnosis present

## 2019-04-18 DIAGNOSIS — K8010 Calculus of gallbladder with chronic cholecystitis without obstruction: Secondary | ICD-10-CM

## 2019-04-18 DIAGNOSIS — K838 Other specified diseases of biliary tract: Secondary | ICD-10-CM | POA: Diagnosis not present

## 2019-04-18 DIAGNOSIS — N183 Chronic kidney disease, stage 3 unspecified: Secondary | ICD-10-CM | POA: Diagnosis present

## 2019-04-18 DIAGNOSIS — D649 Anemia, unspecified: Secondary | ICD-10-CM | POA: Diagnosis not present

## 2019-04-18 DIAGNOSIS — Z03818 Encounter for observation for suspected exposure to other biological agents ruled out: Secondary | ICD-10-CM | POA: Diagnosis not present

## 2019-04-18 DIAGNOSIS — K567 Ileus, unspecified: Secondary | ICD-10-CM | POA: Diagnosis not present

## 2019-04-18 DIAGNOSIS — Z7982 Long term (current) use of aspirin: Secondary | ICD-10-CM | POA: Diagnosis not present

## 2019-04-18 DIAGNOSIS — Z8719 Personal history of other diseases of the digestive system: Secondary | ICD-10-CM

## 2019-04-18 DIAGNOSIS — K823 Fistula of gallbladder: Secondary | ICD-10-CM | POA: Diagnosis not present

## 2019-04-18 DIAGNOSIS — Z48815 Encounter for surgical aftercare following surgery on the digestive system: Secondary | ICD-10-CM | POA: Diagnosis not present

## 2019-04-18 DIAGNOSIS — K822 Perforation of gallbladder: Secondary | ICD-10-CM | POA: Diagnosis not present

## 2019-04-18 DIAGNOSIS — E1122 Type 2 diabetes mellitus with diabetic chronic kidney disease: Secondary | ICD-10-CM | POA: Diagnosis present

## 2019-04-18 DIAGNOSIS — K832 Perforation of bile duct: Secondary | ICD-10-CM | POA: Diagnosis not present

## 2019-04-18 DIAGNOSIS — Z86718 Personal history of other venous thrombosis and embolism: Secondary | ICD-10-CM

## 2019-04-18 DIAGNOSIS — K8012 Calculus of gallbladder with acute and chronic cholecystitis without obstruction: Secondary | ICD-10-CM | POA: Diagnosis not present

## 2019-04-18 DIAGNOSIS — Z833 Family history of diabetes mellitus: Secondary | ICD-10-CM

## 2019-04-18 DIAGNOSIS — E118 Type 2 diabetes mellitus with unspecified complications: Secondary | ICD-10-CM | POA: Diagnosis not present

## 2019-04-18 DIAGNOSIS — M6281 Muscle weakness (generalized): Secondary | ICD-10-CM | POA: Diagnosis not present

## 2019-04-18 DIAGNOSIS — R52 Pain, unspecified: Secondary | ICD-10-CM

## 2019-04-18 DIAGNOSIS — Z7984 Long term (current) use of oral hypoglycemic drugs: Secondary | ICD-10-CM

## 2019-04-18 DIAGNOSIS — I10 Essential (primary) hypertension: Secondary | ICD-10-CM | POA: Diagnosis not present

## 2019-04-18 DIAGNOSIS — Z79899 Other long term (current) drug therapy: Secondary | ICD-10-CM

## 2019-04-18 DIAGNOSIS — Z8249 Family history of ischemic heart disease and other diseases of the circulatory system: Secondary | ICD-10-CM

## 2019-04-18 DIAGNOSIS — N281 Cyst of kidney, acquired: Secondary | ICD-10-CM | POA: Diagnosis not present

## 2019-04-18 DIAGNOSIS — R509 Fever, unspecified: Secondary | ICD-10-CM

## 2019-04-18 DIAGNOSIS — K635 Polyp of colon: Secondary | ICD-10-CM | POA: Diagnosis not present

## 2019-04-18 LAB — COMPREHENSIVE METABOLIC PANEL
ALT: 25 U/L (ref 0–44)
AST: 31 U/L (ref 15–41)
Albumin: 3.6 g/dL (ref 3.5–5.0)
Alkaline Phosphatase: 119 U/L (ref 38–126)
Anion gap: 15 (ref 5–15)
BUN: 21 mg/dL (ref 8–23)
CO2: 21 mmol/L — ABNORMAL LOW (ref 22–32)
Calcium: 8.6 mg/dL — ABNORMAL LOW (ref 8.9–10.3)
Chloride: 106 mmol/L (ref 98–111)
Creatinine, Ser: 1.63 mg/dL — ABNORMAL HIGH (ref 0.61–1.24)
GFR calc Af Amer: 46 mL/min — ABNORMAL LOW (ref >60)
GFR calc non Af Amer: 39 mL/min — ABNORMAL LOW (ref >60)
Glucose, Bld: 191 mg/dL — ABNORMAL HIGH (ref 70–99)
Potassium: 4.4 mmol/L (ref 3.5–5.1)
Sodium: 142 mmol/L (ref 135–145)
Total Bilirubin: 0.7 mg/dL (ref 0.3–1.2)
Total Protein: 6.4 g/dL — ABNORMAL LOW (ref 6.5–8.1)

## 2019-04-18 LAB — CBC WITH DIFFERENTIAL/PLATELET
Abs Immature Granulocytes: 0.04 10*3/uL (ref 0.00–0.07)
Basophils Absolute: 0.1 10*3/uL (ref 0.0–0.1)
Basophils Relative: 0 %
Eosinophils Absolute: 0.1 10*3/uL (ref 0.0–0.5)
Eosinophils Relative: 1 %
HCT: 36.2 % — ABNORMAL LOW (ref 39.0–52.0)
Hemoglobin: 11.1 g/dL — ABNORMAL LOW (ref 13.0–17.0)
Immature Granulocytes: 0 %
Lymphocytes Relative: 29 %
Lymphs Abs: 3.9 10*3/uL (ref 0.7–4.0)
MCH: 29.6 pg (ref 26.0–34.0)
MCHC: 30.7 g/dL (ref 30.0–36.0)
MCV: 96.5 fL (ref 80.0–100.0)
Monocytes Absolute: 0.7 10*3/uL (ref 0.1–1.0)
Monocytes Relative: 5 %
Neutro Abs: 8.7 10*3/uL — ABNORMAL HIGH (ref 1.7–7.7)
Neutrophils Relative %: 65 %
Platelets: 288 10*3/uL (ref 150–400)
RBC: 3.75 MIL/uL — ABNORMAL LOW (ref 4.22–5.81)
RDW: 14.3 % (ref 11.5–15.5)
WBC: 13.5 10*3/uL — ABNORMAL HIGH (ref 4.0–10.5)
nRBC: 0 % (ref 0.0–0.2)

## 2019-04-18 LAB — CBC
HCT: 30.9 % — ABNORMAL LOW (ref 39.0–52.0)
Hemoglobin: 9.5 g/dL — ABNORMAL LOW (ref 13.0–17.0)
MCH: 29.6 pg (ref 26.0–34.0)
MCHC: 30.7 g/dL (ref 30.0–36.0)
MCV: 96.3 fL (ref 80.0–100.0)
Platelets: 234 10*3/uL (ref 150–400)
RBC: 3.21 MIL/uL — ABNORMAL LOW (ref 4.22–5.81)
RDW: 14.3 % (ref 11.5–15.5)
WBC: 10.6 10*3/uL — ABNORMAL HIGH (ref 4.0–10.5)
nRBC: 0 % (ref 0.0–0.2)

## 2019-04-18 LAB — SARS CORONAVIRUS 2 BY RT PCR (HOSPITAL ORDER, PERFORMED IN ~~LOC~~ HOSPITAL LAB): SARS Coronavirus 2: NEGATIVE

## 2019-04-18 LAB — PROTIME-INR
INR: 1 (ref 0.8–1.2)
Prothrombin Time: 12.9 seconds (ref 11.4–15.2)

## 2019-04-18 MED ORDER — HYDRALAZINE HCL 25 MG PO TABS
100.0000 mg | ORAL_TABLET | Freq: Two times a day (BID) | ORAL | Status: DC
  Administered 2019-04-18 – 2019-04-23 (×9): 100 mg via ORAL
  Filled 2019-04-18 (×10): qty 4

## 2019-04-18 MED ORDER — VERAPAMIL HCL ER 240 MG PO TBCR
240.0000 mg | EXTENDED_RELEASE_TABLET | Freq: Every day | ORAL | Status: DC
  Administered 2019-04-18 – 2019-04-22 (×5): 240 mg via ORAL
  Filled 2019-04-18 (×5): qty 1

## 2019-04-18 MED ORDER — BISACODYL 5 MG PO TBEC
10.0000 mg | DELAYED_RELEASE_TABLET | Freq: Once | ORAL | Status: AC
  Administered 2019-04-19: 07:00:00 10 mg via ORAL
  Filled 2019-04-18: qty 2

## 2019-04-18 MED ORDER — ATORVASTATIN CALCIUM 40 MG PO TABS
40.0000 mg | ORAL_TABLET | Freq: Every day | ORAL | Status: DC
  Administered 2019-04-19 – 2019-04-22 (×4): 40 mg via ORAL
  Filled 2019-04-18 (×4): qty 1

## 2019-04-18 MED ORDER — ACETAMINOPHEN 650 MG RE SUPP: 650.0000 mg | Freq: Four times a day (QID) | RECTAL | Status: DC | PRN

## 2019-04-18 MED ORDER — SODIUM CHLORIDE 0.9 % IV BOLUS
500.0000 mL | Freq: Once | INTRAVENOUS | Status: AC
  Administered 2019-04-18: 500 mL via INTRAVENOUS

## 2019-04-18 MED ORDER — INSULIN ASPART 100 UNIT/ML ~~LOC~~ SOLN
0.0000 [IU] | SUBCUTANEOUS | Status: DC
  Administered 2019-04-18: 20:00:00 2 [IU] via SUBCUTANEOUS
  Administered 2019-04-19: 1 [IU] via SUBCUTANEOUS
  Administered 2019-04-19: 2 [IU] via SUBCUTANEOUS

## 2019-04-18 MED ORDER — ACETAMINOPHEN 325 MG PO TABS: 650.0000 mg | ORAL_TABLET | Freq: Four times a day (QID) | ORAL | Status: DC | PRN

## 2019-04-18 MED ORDER — PEG 3350-KCL-NABCB-NACL-NASULF 236 G PO SOLR
4000.0000 mL | Freq: Once | ORAL | Status: AC
  Administered 2019-04-19: 4000 mL via ORAL
  Filled 2019-04-18: qty 4000

## 2019-04-18 MED ORDER — LISINOPRIL 10 MG PO TABS
40.0000 mg | ORAL_TABLET | Freq: Every day | ORAL | Status: DC
  Administered 2019-04-20 – 2019-04-23 (×4): 40 mg via ORAL
  Filled 2019-04-18 (×5): qty 4

## 2019-04-18 MED ORDER — HYDRALAZINE HCL 20 MG/ML IJ SOLN: 10.0000 mg | Freq: Four times a day (QID) | INTRAMUSCULAR | Status: DC | PRN

## 2019-04-18 MED ORDER — SODIUM CHLORIDE 0.9 % IV SOLN
INTRAVENOUS | Status: DC
  Administered 2019-04-18: 18:00:00 via INTRAVENOUS

## 2019-04-18 NOTE — H&P (Addendum)
TRH H&P    Patient Demographics:    Stephen Rogers, is a 78 y.o. male  MRN: [id number]  DOB - Nov 17, 1941  Admit Date - 04/18/2019  Referring MD/NP/PA:  Kem Parkinson PA  Outpatient Primary MD for the patient is [name], MD  Patient coming from: home  Chief complaint- rectal bleeding   HPI:    Stephen Rogers  is a 78 y.o. male,hypertension, hyperlipidemia, Dm2 with CKD stage3, DVT in 2017 after sepsis/cholecystitis w perc cholecystostomy, h/o prior colonoscopy 2011=> diverticulosis, cecal adenoma, apparently c/o painless rectal bleeding x3. Slight abdominal discomfort around umbilicus earlier in the day but none presently.  Pt notes nausea.  Pt notes takes aspirin, but denies NSAIDS or blood thinners.  Pt denies dizziness,  Cp, palp, sob, emesis, diarrhea, black stool, dysuria.    In ED,  T 97.6  P 119  R 12 Bp 118/70  Pox 93%   Wt 91.2kg  Wbc 13.5, Hgb 11.2 (prev 12.1, 8.4) INR 1.0 Type and screen pending Na 142,  K 4.4  Bun 21, Creatinine 1.63 Alb 3.6 Ast 31, Alt 25  Pt made NPO,  GI consulted, appreciate input.   Pt started on ns at 5ml per hour in ED.   Pt will be admitted for rectal bleeding.    Review of systems:    In addition to the HPI above,  No Fever-chills, No Headache, No changes with Vision or hearing, No problems swallowing food or Liquids, No Chest pain, Cough or Shortness of Breath, No Abdominal pain, No Nausea or Vomiting,  No Blood in Urine, No dysuria, No new skin rashes or bruises, No new joints pains-aches,  No new weakness, tingling, numbness in any extremity, No recent weight gain or loss, No polyuria, polydypsia or polyphagia, No significant Mental Stressors.  All other systems reviewed and are negative.    Past History of the following :    Past Medical History:  Diagnosis Date  . CKD (chronic kidney disease), stage III ([place])   . Diabetes  mellitus   . DVT (deep venous thrombosis) ([place]) 10/14/2016  . Extremity edema 09/2016  . Hyperlipemia   . Hypertension       Past Surgical History:  Procedure Laterality Date  . COLONOSCOPY  02/2010   Dr. [name]: diverticuolosis, multiple polyps removed, cecal adenoma.   . IR GENERIC HISTORICAL  10/19/2016   IR PERC CHOLECYSTOSTOMY 10/19/2016 [name], MD [facility]  . IR GENERIC HISTORICAL  10/25/2016   IR CATHETER TUBE CHANGE 10/25/2016 [name], MD [facility]  . IR GENERIC HISTORICAL  11/30/2016   IR RADIOLOGIST EVAL & MGMT 11/30/2016 [facility]      Social History:      Social History   Tobacco Use  . Smoking status: Never Smoker  . Smokeless tobacco: Never Used  Substance Use Topics  . Alcohol use: No       Family History :     Family History  Problem Relation Age of Onset  . Heart attack Other   . Tuberculosis  Other   . Tuberculosis Other   . Diabetes type II Father   . Hypertension Father   . Heart attack Maternal Aunt        Home Medications:   Prior to Admission medications   Medication Sig Start Date End Date Taking? Authorizing Provider  aspirin EC 81 MG tablet Take 81 mg by mouth every evening.    Yes [provider]  atorvastatin (LIPITOR) 40 MG tablet Take 40 mg by mouth daily.   Yes [provider]  furosemide (LASIX) 20 MG tablet Take 20 mg by mouth every evening.  02/28/19  Yes [provider]  glipiZIDE (GLUCOTROL) 10 MG tablet Take 10 mg by mouth 2 (two) times daily before a meal.    Yes [provider]  hydrALAZINE (APRESOLINE) 100 MG tablet Take 1 tablet (100 mg total) by mouth 3 (three) times daily. Patient taking differently: Take 100 mg by mouth 2 (two) times daily.  08/31/18  Yes BranchAlphonse Guild, MD  lisinopril (PRINIVIL,ZESTRIL) 40 MG tablet Take 40 mg by mouth daily.   Yes [provider]  metFORMIN (GLUCOPHAGE) 500 MG tablet Take 500 mg by mouth 2 (two) times a day.  02/28/19  Yes [provider]  spironolactone (ALDACTONE) 25 MG tablet Take 25 mg by mouth daily.   Yes [provider]  verapamil (CALAN-SR) 240 MG CR tablet Take 240 mg by mouth at bedtime.   Yes [provider]     Allergies:    No Known Allergies   Physical Exam:   Vitals  Blood pressure 118/70, pulse 97, temperature 97.6 F (36.4 C), temperature source Oral, resp. rate 12, height 5\' 6"  (1.676 m), weight 91.2 kg, SpO2 93 %.  1.  General: aoxox3  2. Psychiatric: euthymic  3. Neurologic: cn2-12 intact, reflexes 2+ symmetric, diffuse , no clonus, motor 5/5 in all 4 ext  4. HEENMT:  Anicteric,conjunctiva pink,  pupils 1.50mm symmetric, direct, consensual, near intact Mmm Neck: no jvd  5. Respiratory : CTAB  6. Cardiovascular : rrr s1, s2, 1/6 sem LLSB  7. Gastrointestinal:  Abd: soft, nt, nd, +bs, no hsm Per ED, maroon brown stool mixed together on rectal exam  8. Skin:  Ext: no c/c/e,  No rash  9.Musculoskeletal:  Good ROM,  No adenoapathy    Data Review:    CBC Recent Labs  Lab 04/18/19 1545  WBC 13.5*  HGB 11.1*  HCT 36.2*  PLT 288  MCV 96.5  MCH 29.6  MCHC 30.7  RDW 14.3  LYMPHSABS 3.9  MONOABS 0.7  EOSABS 0.1  BASOSABS 0.1   ------------------------------------------------------------------------------------------------------------------  Results for orders placed or performed during the hospital encounter of 04/18/19 (from the past 48 hour(s))  Comprehensive metabolic panel     Status: Abnormal   Collection Time: 04/18/19  3:45 PM  Result Value Ref Range   Sodium 142 135 - 145 mmol/L   Potassium 4.4 3.5 - 5.1 mmol/L   Chloride 106 98 - 111 mmol/L   CO2 21 (L) 22 - 32 mmol/L   Glucose, Bld 191 (H) 70 - 99 mg/dL   BUN 21 8 - 23 mg/dL   Creatinine, Ser 1.63 (H) 0.61 - 1.24 mg/dL   Calcium 8.6 (L) 8.9 - 10.3 mg/dL   Total Protein 6.4 (L) 6.5 - 8.1 g/dL   Albumin 3.6 3.5 - 5.0 g/dL   AST 31 15 - 41 U/L   ALT  25 0 - 44 U/L   Alkaline Phosphatase  119 38 - 126 U/L   Total Bilirubin 0.7 0.3 - 1.2 mg/dL   GFR calc non Af Amer 39 (L) >60 mL/min   GFR calc Af Amer 46 (L) >60 mL/min   Anion gap 15 5 - 15    Comment: Performed at [facility], [address], [place], [place] [address]  CBC WITH DIFFERENTIAL     Status: Abnormal   Collection Time: 04/18/19  3:45 PM  Result Value Ref Range   WBC 13.5 (H) 4.0 - 10.5 K/uL   RBC 3.75 (L) 4.22 - 5.81 MIL/uL   Hemoglobin 11.1 (L) 13.0 - 17.0 g/dL   HCT 36.2 (L) 39.0 - 52.0 %   MCV 96.5 80.0 - 100.0 fL   MCH 29.6 26.0 - 34.0 pg   MCHC 30.7 30.0 - 36.0 g/dL   RDW 14.3 11.5 - 15.5 %   Platelets 288 150 - 400 K/uL   nRBC 0.0 0.0 - 0.2 %   Neutrophils Relative % 65 %   Neutro Abs 8.7 (H) 1.7 - 7.7 K/uL   Lymphocytes Relative 29 %   Lymphs Abs 3.9 0.7 - 4.0 K/uL   Monocytes Relative 5 %   Monocytes Absolute 0.7 0.1 - 1.0 K/uL   Eosinophils Relative 1 %   Eosinophils Absolute 0.1 0.0 - 0.5 K/uL   Basophils Relative 0 %   Basophils Absolute 0.1 0.0 - 0.1 K/uL   Immature Granulocytes 0 %   Abs Immature Granulocytes 0.04 0.00 - 0.07 K/uL    Comment: Performed at [facility], [address], [place], [place] [address]  Protime-INR     Status: None   Collection Time: 04/18/19  3:45 PM  Result Value Ref Range   Prothrombin Time 12.9 11.4 - 15.2 seconds   INR 1.0 0.8 - 1.2    Comment: (NOTE) INR goal varies based on device and disease states. Performed at [facility], [address] [place], [place] [address]   Type and screen [employer]     Status: None   Collection Time: 04/18/19  3:45 PM  Result Value Ref Range   ABO/RH(D) B POS    Antibody Screen POS    Sample Expiration      04/21/2019,2359 Performed at [facility], [address], [place],  [address]     Chemistries  Recent Labs  Lab 04/18/19 1545  NA 142  K 4.4  CL 106  CO2 21*  GLUCOSE 191*  BUN 21  CREATININE 1.63*  CALCIUM 8.6*  AST 31  ALT 25  ALKPHOS 119   BILITOT 0.7   ------------------------------------------------------------------------------------------------------------------  ------------------------------------------------------------------------------------------------------------------ GFR: Estimated Creatinine Clearance: 38.9 mL/min (A) (by C-G formula based on SCr of 1.63 mg/dL (H)). Liver Function Tests: Recent Labs  Lab 04/18/19 1545  AST 31  ALT 25  ALKPHOS 119  BILITOT 0.7  PROT 6.4*  ALBUMIN 3.6   No results for input(s): LIPASE, AMYLASE in the last 168 hours. No results for input(s): AMMONIA in the last 168 hours. Coagulation Profile: Recent Labs  Lab 04/18/19 1545  INR 1.0   Cardiac Enzymes: No results for input(s): CKTOTAL, CKMB, CKMBINDEX, TROPONINI in the last 168 hours. BNP (last 3 results) No results for input(s): PROBNP in the last 8760 hours. HbA1C: No results for input(s): HGBA1C in the last 72 hours. CBG: No results for input(s): GLUCAP in the last 168 hours. Lipid Profile: No results for input(s): CHOL, HDL, LDLCALC, TRIG, CHOLHDL, LDLDIRECT in the last 72 hours. Thyroid Function Tests:  No results for input(s): TSH, T4TOTAL, FREET4, T3FREE, THYROIDAB in the last 72 hours. Anemia Panel: No results for input(s): VITAMINB12, FOLATE, FERRITIN, TIBC, IRON, RETICCTPCT in the last 72 hours.  --------------------------------------------------------------------------------------------------------------- Urine analysis:    Component Value Date/Time   COLORURINE YELLOW 10/17/2016 2204   APPEARANCEUR CLOUDY (A) 10/17/2016 2204   LABSPEC 1.016 10/17/2016 2204   PHURINE 5.0 10/17/2016 2204   GLUCOSEU NEGATIVE 10/17/2016 2204   HGBUR MODERATE (A) 10/17/2016 2204   BILIRUBINUR MODERATE (A) 10/17/2016 2204   KETONESUR NEGATIVE 10/17/2016 2204   PROTEINUR 30 (A) 10/17/2016 2204   UROBILINOGEN 0.2 01/04/2012 1646   NITRITE NEGATIVE 10/17/2016 2204   LEUKOCYTESUR TRACE (A) 10/17/2016 2204       Imaging Results:    No results found.     Assessment & Plan:    Principal Problem:   Rectal bleeding Active Problems:   Diabetes mellitus ([place])   CKD (chronic kidney disease), stage III ([facility])   Anemia  Rectal bleeding,  H/o diverticulosis, cecal polyp 2011 Stop Aspirin NPO Ns iv GI consulted by ED for w/up, appreciate input  Abdominal discomfort, now resolved per pt Check lipase Monitor clinically, defer to GI whether needs CT abd/ pelvis for further evaluaion  Anemia (secondary to melena, ckd), Leukocytosis Trend Hgb, Repeat cbc at 2330, and in AM  Dm2 Hold metformin, Note probably should not use with CKD stage3 fsbs q4h, ISS  Hypertension Cont Verapamil Cont Lisinopril Cont Hydralazine Hold Lasix/ Spironolactone since NPO Hydralazine 10mg  iv q6h prn sbp >160  Hyperlipidemia Cont Lipitor 40mg  po qhs     DVT Prophylaxis-  - SCDs   AM Labs Ordered, also please review Full Orders  Family Communication: Admission, patients condition and plan of care including tests being ordered have been discussed with the patient who indicate understanding and agree with the plan and Code Status.  Code Status:  FULL CODE  Admission status: Observation/: Based on patients clinical presentation and evaluation of above clinical data, I have made determination that patient meets observation criteria at this time. Pt  Will require admission for rectal bleeding.  Will make observation at this time. Depending upon his clinical course, or GI recommendations may require inpatient stay,   Time spent in minutes : 55   Jani Gravel M.D on 04/18/2019 at 5:36 PM

## 2019-04-18 NOTE — ED Provider Notes (Signed)
  1705  Pt signed out to me by Evalee Jefferson, PA-C.  Pt is 78 yo male with sudden onset of gross blood per rectum 3 hrs prior to arrival.  Hx of HTN, DM, CKD stage III.  He does take 81 mg ASA daily, no hx of PUD, diverticular dz or previous GI bleeding.  On his initial exam here, it was noted that pt had blood clot from his anus on exam and blood on left foot and blood inside his shoe.    GI, Dr. [name], has been consulted and I was advised by his PA, Magda Paganini, that plan is for colonoscopy tomorrow.    Labs Reviewed  COMPREHENSIVE METABOLIC PANEL - Abnormal; Notable for the following components:      Result Value   CO2 21 (*)    Glucose, Bld 191 (*)    Creatinine, Ser 1.63 (*)    Calcium 8.6 (*)    Total Protein 6.4 (*)    GFR calc non Af Amer 39 (*)    GFR calc Af Amer 46 (*)    All other components within normal limits  CBC WITH DIFFERENTIAL/PLATELET - Abnormal; Notable for the following components:   WBC 13.5 (*)    RBC 3.75 (*)    Hemoglobin 11.1 (*)    HCT 36.2 (*)    Neutro Abs 8.7 (*)    All other components within normal limits  SARS CORONAVIRUS 2 (HOSPITAL ORDER, [place] [facility])  PROTIME-INR  TYPE AND SCREEN    1720 pt is resting comfortably, appears hemodynamically stable. CMET resulted, pt has CKD.  Will consult hospitalist for admit.    [place] hospitalist, Dr. Maudie Mercury, who agrees to admit.     Kem Parkinson, PA-C 04/18/19 1736    Nat Christen, MD 04/18/19 2000

## 2019-04-18 NOTE — Consult Note (Signed)
Referring Provider: Nat Christen, MD Primary Care Physician:  [name], MD Primary Gastroenterologist:  Garfield Cornea, MD  Reason for Consultation:  Rectal bleeding  HPI: Stephen Bales Sr. is a 78 y.o. male presented to ED with acute onset rectal bleeding which began this afternoon. Patient has PMH significant for DM, HTN, CKD, DVT in 2017 during prolonged hospitalization for cholecystitis/sepsis (received percutaneous cholecystostomy only). He presents with several episodes of painless rectal bleeding. Initially had urge for BM, passed couple of watery nonbloody stools. After that he passed 3 bloody BMS, large volume associated with clots. Clots noted in his shoe on arrival. Associated with lightheadedness. No abd pain. Appetite good. No GERD, dysphagia, vomiting. No melena. Takes asa 81mg  daily. No other blood thinners. Last colonoscopy in 2011, diverticulosis, cecal adenoma removed.   In ED, H/H 11.1/36.2, WBC 13,500, Platelets 288000, INR 1, creatinine 1.63, BUN 21. No additional episodes of bleeding since in the ED.    Prior to Admission medications   Medication Sig Start Date End Date Taking? Authorizing Provider  aspirin EC 81 MG tablet Take 81 mg by mouth every evening.    Yes [provider]  atorvastatin (LIPITOR) 40 MG tablet Take 40 mg by mouth daily.   Yes [provider]  furosemide (LASIX) 20 MG tablet Take 20 mg by mouth every evening.  02/28/19  Yes [provider]  glipiZIDE (GLUCOTROL) 10 MG tablet Take 10 mg by mouth 2 (two) times daily before a meal.    Yes [provider]  hydrALAZINE (APRESOLINE) 100 MG tablet Take 1 tablet (100 mg total) by mouth 3 (three) times daily. Patient taking differently: Take 100 mg by mouth 2 (two) times daily.  08/31/18  Yes BranchAlphonse Guild, MD  lisinopril (PRINIVIL,ZESTRIL) 40 MG tablet Take 40 mg by mouth daily.   Yes [provider]  metFORMIN (GLUCOPHAGE) 500 MG tablet Take 500 mg by  mouth 2 (two) times a day. 02/28/19  Yes [provider]  spironolactone (ALDACTONE) 25 MG tablet Take 25 mg by mouth daily.   Yes [provider]  verapamil (CALAN-SR) 240 MG CR tablet Take 240 mg by mouth at bedtime.   Yes [provider]    No current facility-administered medications for this encounter.    Current Outpatient Medications  Medication Sig Dispense Refill  . aspirin EC 81 MG tablet Take 81 mg by mouth every evening.     Marland Kitchen atorvastatin (LIPITOR) 40 MG tablet Take 40 mg by mouth daily.    . furosemide (LASIX) 20 MG tablet Take 20 mg by mouth every evening.     Marland Kitchen glipiZIDE (GLUCOTROL) 10 MG tablet Take 10 mg by mouth 2 (two) times daily before a meal.     . hydrALAZINE (APRESOLINE) 100 MG tablet Take 1 tablet (100 mg total) by mouth 3 (three) times daily. (Patient taking differently: Take 100 mg by mouth 2 (two) times daily. ) 270 tablet 3  . lisinopril (PRINIVIL,ZESTRIL) 40 MG tablet Take 40 mg by mouth daily.    . metFORMIN (GLUCOPHAGE) 500 MG tablet Take 500 mg by mouth 2 (two) times a day.    . spironolactone (ALDACTONE) 25 MG tablet Take 25 mg by mouth daily.    . verapamil (CALAN-SR) 240 MG CR tablet Take 240 mg by mouth at bedtime.      Allergies as of 04/18/2019  . (No Known Allergies)    Past Medical History:  Diagnosis Date  . CKD (chronic kidney disease),  stage III ([place])   . Diabetes mellitus   . DVT (deep venous thrombosis) ([place]) 10/14/2016  . Extremity edema 09/2016  . Hyperlipemia   . Hypertension     Past Surgical History:  Procedure Laterality Date  . COLONOSCOPY  02/2010   Dr. [name]: diverticuolosis, multiple polyps removed, cecal adenoma.   . IR GENERIC HISTORICAL  10/19/2016   IR PERC CHOLECYSTOSTOMY 10/19/2016 [name], MD [facility]  . IR GENERIC HISTORICAL  10/25/2016   IR CATHETER TUBE CHANGE 10/25/2016 [name], MD [facility]  . IR GENERIC HISTORICAL  11/30/2016   IR RADIOLOGIST EVAL & MGMT  11/30/2016 [facility]    Family History  Problem Relation Age of Onset  . Heart attack Other   . Tuberculosis Other   . Tuberculosis Other   . Diabetes type II Father   . Hypertension Father   . Heart attack Maternal Aunt     Social History   Socioeconomic History  . Marital status: Widowed    Spouse name: Not on file  . Number of children: Not on file  . Years of education: Not on file  . Highest education level: Not on file  Occupational History  . Not on file  Social Needs  . Financial resource strain: Not on file  . Food insecurity:    Worry: Not on file    Inability: Not on file  . Transportation needs:    Medical: Not on file    Non-medical: Not on file  Tobacco Use  . Smoking status: Never Smoker  . Smokeless tobacco: Never Used  Substance and Sexual Activity  . Alcohol use: No  . Drug use: No  . Sexual activity: Not on file  Lifestyle  . Physical activity:    Days per week: Not on file    Minutes per session: Not on file  . Stress: Not on file  Relationships  . Social connections:    Talks on phone: Not on file    Gets together: Not on file    Attends religious service: Not on file    Active member of club or organization: Not on file    Attends meetings of clubs or organizations: Not on file    Relationship status: Not on file  . Intimate partner violence:    Fear of current or ex partner: Not on file    Emotionally abused: Not on file    Physically abused: Not on file    Forced sexual activity: Not on file  Other Topics Concern  . Not on file  Social History Narrative  . Not on file     ROS:  General: Negative for anorexia, weight loss, fever, chills, fatigue,+ weakness. Eyes: Negative for vision changes.  ENT: Negative for hoarseness, difficulty swallowing , nasal congestion. CV: Negative for chest pain, angina, palpitations, dyspnea on exertion, peripheral edema.  Respiratory: Negative for dyspnea at rest, dyspnea on exertion,  cough, sputum, wheezing.  GI: See history of present illness. GU:  Negative for dysuria, hematuria, urinary incontinence, urinary frequency, nocturnal urination.  MS: Negative for joint pain, low back pain.  Derm: Negative for rash or itching.  Neuro: Negative for weakness, abnormal sensation, seizure, frequent headaches, memory loss, confusion.  Psych: Negative for anxiety, depression, suicidal ideation, hallucinations.  Endo: Negative for unusual weight change.  Heme: Negative for bruising or bleeding. Allergy: Negative for rash or hives.       Physical Examination: Vital signs in last 24 hours: Temp:  [  97.6 F (36.4 C)] 97.6 F (36.4 C) (05/27 1518) Pulse Rate:  [119] 119 (05/27 1518) Resp:  [14] 14 (05/27 1518) BP: (129)/(85) 129/85 (05/27 1518) SpO2:  [100 %] 100 % (05/27 1518) Weight:  [91.2 kg] 91.2 kg (05/27 1518)    General: Well-nourished, well-developed in no acute distress.  Head: Normocephalic, atraumatic.   Eyes: Conjunctiva pink, no icterus. Mouth: Oropharyngeal mucosa moist and pink , no lesions erythema or exudate. Neck: Supple without thyromegaly, masses, or lymphadenopathy.  Lungs: Clear to auscultation bilaterally.  Heart: Regular rate and rhythm, no murmurs rubs or gallops.  Abdomen: Bowel sounds are normal, nontender, nondistended, no hepatosplenomegaly or masses, no abdominal bruits or    hernia , no rebound or guarding.   Rectal: not performed Extremities: No lower extremity edema, clubbing, deformity.  Neuro: Alert and oriented x 4 , grossly normal neurologically.  Skin: Warm and dry, no rash or jaundice.   Psych: Alert and cooperative, normal mood and affect.        Intake/Output from previous day: No intake/output data recorded. Intake/Output this shift: No intake/output data recorded.  Lab Results: CBC Recent Labs    04/18/19 1545  WBC 13.5*  HGB 11.1*  HCT 36.2*  MCV 96.5  PLT 288   BMET No results for input(s): NA, K, CL, CO2,  GLUCOSE, BUN, CREATININE, CALCIUM in the last 72 hours. LFT No results for input(s): BILITOT, BILIDIR, IBILI, ALKPHOS, AST, ALT, PROT, ALBUMIN in the last 72 hours.  Lipase No results for input(s): LIPASE in the last 72 hours.  PT/INR Recent Labs    04/18/19 1545  LABPROT 12.9  INR 1.0      Imaging Studies: No results found.Minnie.Brome week]   Impression: 78 y/o male presenting with acute onset painless hematochezia associated with blood clots, lightheadedness. His vitals have been stable with exception of my tachycardia. Initial Hgb 11.1 without recent baseline available. Suspect diverticular bleeding.     Plan: 1. Serial H/H. Transfuse if needed.  2. Clear liquid diet.  3. Consider colonoscopy tomorrow.  I have discussed the risks, alternatives, benefits with regards to but not limited to the risk of reaction to medication, bleeding, infection, perforation and the patient is agreeable to proceed. Written consent to be obtained.  We would like to thank you for the opportunity to participate in the care of [place].Marland Kitchen  Laureen Ochs. Bernarda Caffey [facility] [phone] 5/27/20205:18 PM     LOS: 0 days

## 2019-04-18 NOTE — H&P (View-Only) (Signed)
Referring Provider: Nat Christen, MD Primary Care Physician:  [name], MD Primary Gastroenterologist:  Garfield Cornea, MD  Reason for Consultation:  Rectal bleeding  HPI: Stephen Bales Sr. is a 78 y.o. male presented to ED with acute onset rectal bleeding which began this afternoon. Patient has PMH significant for DM, HTN, CKD, DVT in 2017 during prolonged hospitalization for cholecystitis/sepsis (received percutaneous cholecystostomy only). He presents with several episodes of painless rectal bleeding. Initially had urge for BM, passed couple of watery nonbloody stools. After that he passed 3 bloody BMS, large volume associated with clots. Clots noted in his shoe on arrival. Associated with lightheadedness. No abd pain. Appetite good. No GERD, dysphagia, vomiting. No melena. Takes asa 81mg  daily. No other blood thinners. Last colonoscopy in 2011, diverticulosis, cecal adenoma removed.   In ED, H/H 11.1/36.2, WBC 13,500, Platelets 288000, INR 1, creatinine 1.63, BUN 21. No additional episodes of bleeding since in the ED.    Prior to Admission medications   Medication Sig Start Date End Date Taking? Authorizing Provider  aspirin EC 81 MG tablet Take 81 mg by mouth every evening.    Yes [provider]  atorvastatin (LIPITOR) 40 MG tablet Take 40 mg by mouth daily.   Yes [provider]  furosemide (LASIX) 20 MG tablet Take 20 mg by mouth every evening.  02/28/19  Yes [provider]  glipiZIDE (GLUCOTROL) 10 MG tablet Take 10 mg by mouth 2 (two) times daily before a meal.    Yes [provider]  hydrALAZINE (APRESOLINE) 100 MG tablet Take 1 tablet (100 mg total) by mouth 3 (three) times daily. Patient taking differently: Take 100 mg by mouth 2 (two) times daily.  08/31/18  Yes BranchAlphonse Guild, MD  lisinopril (PRINIVIL,ZESTRIL) 40 MG tablet Take 40 mg by mouth daily.   Yes [provider]  metFORMIN (GLUCOPHAGE) 500 MG tablet Take 500 mg by  mouth 2 (two) times a day. 02/28/19  Yes [provider]  spironolactone (ALDACTONE) 25 MG tablet Take 25 mg by mouth daily.   Yes [provider]  verapamil (CALAN-SR) 240 MG CR tablet Take 240 mg by mouth at bedtime.   Yes [provider]    No current facility-administered medications for this encounter.    Current Outpatient Medications  Medication Sig Dispense Refill  . aspirin EC 81 MG tablet Take 81 mg by mouth every evening.     Marland Kitchen atorvastatin (LIPITOR) 40 MG tablet Take 40 mg by mouth daily.    . furosemide (LASIX) 20 MG tablet Take 20 mg by mouth every evening.     Marland Kitchen glipiZIDE (GLUCOTROL) 10 MG tablet Take 10 mg by mouth 2 (two) times daily before a meal.     . hydrALAZINE (APRESOLINE) 100 MG tablet Take 1 tablet (100 mg total) by mouth 3 (three) times daily. (Patient taking differently: Take 100 mg by mouth 2 (two) times daily. ) 270 tablet 3  . lisinopril (PRINIVIL,ZESTRIL) 40 MG tablet Take 40 mg by mouth daily.    . metFORMIN (GLUCOPHAGE) 500 MG tablet Take 500 mg by mouth 2 (two) times a day.    . spironolactone (ALDACTONE) 25 MG tablet Take 25 mg by mouth daily.    . verapamil (CALAN-SR) 240 MG CR tablet Take 240 mg by mouth at bedtime.      Allergies as of 04/18/2019  . (No Known Allergies)    Past Medical History:  Diagnosis Date  . CKD (chronic kidney disease),  stage III ([place])   . Diabetes mellitus   . DVT (deep venous thrombosis) ([place]) 10/14/2016  . Extremity edema 09/2016  . Hyperlipemia   . Hypertension     Past Surgical History:  Procedure Laterality Date  . COLONOSCOPY  02/2010   Dr. [name]: diverticuolosis, multiple polyps removed, cecal adenoma.   . IR GENERIC HISTORICAL  10/19/2016   IR PERC CHOLECYSTOSTOMY 10/19/2016 [name], MD [facility]  . IR GENERIC HISTORICAL  10/25/2016   IR CATHETER TUBE CHANGE 10/25/2016 [name], MD [facility]  . IR GENERIC HISTORICAL  11/30/2016   IR RADIOLOGIST EVAL & MGMT  11/30/2016 [facility]    Family History  Problem Relation Age of Onset  . Heart attack Other   . Tuberculosis Other   . Tuberculosis Other   . Diabetes type II Father   . Hypertension Father   . Heart attack Maternal Aunt     Social History   Socioeconomic History  . Marital status: Widowed    Spouse name: Not on file  . Number of children: Not on file  . Years of education: Not on file  . Highest education level: Not on file  Occupational History  . Not on file  Social Needs  . Financial resource strain: Not on file  . Food insecurity:    Worry: Not on file    Inability: Not on file  . Transportation needs:    Medical: Not on file    Non-medical: Not on file  Tobacco Use  . Smoking status: Never Smoker  . Smokeless tobacco: Never Used  Substance and Sexual Activity  . Alcohol use: No  . Drug use: No  . Sexual activity: Not on file  Lifestyle  . Physical activity:    Days per week: Not on file    Minutes per session: Not on file  . Stress: Not on file  Relationships  . Social connections:    Talks on phone: Not on file    Gets together: Not on file    Attends religious service: Not on file    Active member of club or organization: Not on file    Attends meetings of clubs or organizations: Not on file    Relationship status: Not on file  . Intimate partner violence:    Fear of current or ex partner: Not on file    Emotionally abused: Not on file    Physically abused: Not on file    Forced sexual activity: Not on file  Other Topics Concern  . Not on file  Social History Narrative  . Not on file     ROS:  General: Negative for anorexia, weight loss, fever, chills, fatigue,+ weakness. Eyes: Negative for vision changes.  ENT: Negative for hoarseness, difficulty swallowing , nasal congestion. CV: Negative for chest pain, angina, palpitations, dyspnea on exertion, peripheral edema.  Respiratory: Negative for dyspnea at rest, dyspnea on exertion,  cough, sputum, wheezing.  GI: See history of present illness. GU:  Negative for dysuria, hematuria, urinary incontinence, urinary frequency, nocturnal urination.  MS: Negative for joint pain, low back pain.  Derm: Negative for rash or itching.  Neuro: Negative for weakness, abnormal sensation, seizure, frequent headaches, memory loss, confusion.  Psych: Negative for anxiety, depression, suicidal ideation, hallucinations.  Endo: Negative for unusual weight change.  Heme: Negative for bruising or bleeding. Allergy: Negative for rash or hives.       Physical Examination: Vital signs in last 24 hours: Temp:  [  97.6 F (36.4 C)] 97.6 F (36.4 C) (05/27 1518) Pulse Rate:  [119] 119 (05/27 1518) Resp:  [14] 14 (05/27 1518) BP: (129)/(85) 129/85 (05/27 1518) SpO2:  [100 %] 100 % (05/27 1518) Weight:  [91.2 kg] 91.2 kg (05/27 1518)    General: Well-nourished, well-developed in no acute distress.  Head: Normocephalic, atraumatic.   Eyes: Conjunctiva pink, no icterus. Mouth: Oropharyngeal mucosa moist and pink , no lesions erythema or exudate. Neck: Supple without thyromegaly, masses, or lymphadenopathy.  Lungs: Clear to auscultation bilaterally.  Heart: Regular rate and rhythm, no murmurs rubs or gallops.  Abdomen: Bowel sounds are normal, nontender, nondistended, no hepatosplenomegaly or masses, no abdominal bruits or    hernia , no rebound or guarding.   Rectal: not performed Extremities: No lower extremity edema, clubbing, deformity.  Neuro: Alert and oriented x 4 , grossly normal neurologically.  Skin: Warm and dry, no rash or jaundice.   Psych: Alert and cooperative, normal mood and affect.        Intake/Output from previous day: No intake/output data recorded. Intake/Output this shift: No intake/output data recorded.  Lab Results: CBC Recent Labs    04/18/19 1545  WBC 13.5*  HGB 11.1*  HCT 36.2*  MCV 96.5  PLT 288   BMET No results for input(s): NA, K, CL, CO2,  GLUCOSE, BUN, CREATININE, CALCIUM in the last 72 hours. LFT No results for input(s): BILITOT, BILIDIR, IBILI, ALKPHOS, AST, ALT, PROT, ALBUMIN in the last 72 hours.  Lipase No results for input(s): LIPASE in the last 72 hours.  PT/INR Recent Labs    04/18/19 1545  LABPROT 12.9  INR 1.0      Imaging Studies: No results found.Minnie.[place]rome week]   Impression: 78 y/o male presenting with acute onset painless hematochezia associated with blood clots, lightheadedness. His vitals have been stable with exception of my tachycardia. Initial Hgb 11.1 without recent baseline available. Suspect diverticular bleeding.     Plan: 1. Serial H/H. Transfuse if needed.  2. Clear liquid diet.  3. Consider colonoscopy tomorrow.  I have discussed the risks, alternatives, benefits with regards to but not limited to the risk of reaction to medication, bleeding, infection, perforation and the patient is agreeable to proceed. Written consent to be obtained.  We would like to thank you for the opportunity to participate in the care of Stephen Rogers.Marland Kitchen  Laureen Ochs. Bernarda Caffey [facility] [phone] 5/27/20205:18 PM     LOS: 0 days

## 2019-04-18 NOTE — ED Provider Notes (Signed)
[facility] Provider Note   CSN: [id number] Arrival date & time: 04/18/19  1510    History   Chief Complaint Chief Complaint  Patient presents with  . Rectal Bleeding    HPI Stephen SCHOR Sr. is a 78 y.o. male with a history as outlined below, most significant for hypertension, diabetes, chronic kidney disease stage III, transaminitis (associated with acute cholecystitis) and history of DVT but no longer on Eliquis, but does take a baby aspirin daily presenting with a 3-hour history of episodic rectal bleeding.  He reports having a watery stool with some formed stool around 1 PM which was not bloody, but then had 3 successive stools with bright red blood and clots.  He also endorses feeling lightheaded with the third episode of bleeding.  He denies abdominal pain or cramping, denies upper abdominal pain, acid reflux or PUD. Denies prior history of GI bleeding, no problems with hemorrhoids, had a screening colonoscopy in 2011 (per chart) no diverticular disease, but had multiple benign polyps.    He has had no treatment prior to arrival.    Denies history of hemorrhoids.  No history of constipation.  Pt bled all the way into his shoes prior to arrival, several  Blood clots noted on his left foot and in his shoe.     The history is provided by the patient.    Past Medical History:  Diagnosis Date  . CKD (chronic kidney disease), stage III ([place])   . Diabetes mellitus   . DVT (deep venous thrombosis) ([place]) 10/14/2016  . Extremity edema 09/2016  . Hyperlipemia   . Hypertension     Patient Active Problem List   Diagnosis Date Noted  . Cholecystitis   . Fever   . At high risk for fluid overload 10/13/2016  . Fluid overload 10/13/2016  . Bilateral lower extremity edema 10/13/2016  . Gait disturbance 10/13/2016  . Leukocytosis 10/13/2016  . CKD (chronic kidney disease), stage III ([place]) 10/13/2016  . Left leg DVT ([place]) 10/13/2016  . Acute renal failure (ARF)  ([place]) 10/08/2016  . Nausea and vomiting 10/08/2016  . Diabetes mellitus ([place]) 10/08/2016  . Transaminitis 10/08/2016  . Hyperlipemia 10/08/2016  . Renal insufficiency   . Dehydration 10/07/2016    Past Surgical History:  Procedure Laterality Date  . COLONOSCOPY  02/2010   Dr. [name]: diverticuolosis, multiple polyps removed, cecal adenoma.   . IR GENERIC HISTORICAL  10/19/2016   IR PERC CHOLECYSTOSTOMY 10/19/2016 [name], MD [facility]  . IR GENERIC HISTORICAL  10/25/2016   IR CATHETER TUBE CHANGE 10/25/2016 [name], MD [facility]  . IR GENERIC HISTORICAL  11/30/2016   IR RADIOLOGIST EVAL & MGMT 11/30/2016 [facility] INTER[facility] RAD        Home Medications    Prior to Admission medications   Medication Sig Start Date End Date Taking? Authorizing Provider  aspirin EC 81 MG tablet Take 81 mg by mouth every evening.    Yes [provider]  atorvastatin (LIPITOR) 40 MG tablet Take 40 mg by mouth daily.   Yes [provider]  furosemide (LASIX) 20 MG tablet Take 20 mg by mouth every evening.  02/28/19  Yes [provider]  glipiZIDE (GLUCOTROL) 10 MG tablet Take 10 mg by mouth 2 (two) times daily before a meal.    Yes [provider]  hydrALAZINE (APRESOLINE) 100 MG tablet Take 1 tablet (100 mg total) by mouth 3 (three) times daily. Patient taking differently: Take 100  mg by mouth 2 (two) times daily.  08/31/18  Yes BranchAlphonse Guild, MD  lisinopril (PRINIVIL,ZESTRIL) 40 MG tablet Take 40 mg by mouth daily.   Yes [provider]  metFORMIN (GLUCOPHAGE) 500 MG tablet Take 500 mg by mouth 2 (two) times a day. 02/28/19  Yes [provider]  spironolactone (ALDACTONE) 25 MG tablet Take 25 mg by mouth daily.   Yes [provider]  verapamil (CALAN-SR) 240 MG CR tablet Take 240 mg by mouth at bedtime.   Yes [provider]    Family History Family History  Problem Relation Age of Onset  . Heart attack Other   .  Tuberculosis Other   . Tuberculosis Other   . Diabetes type II Father   . Hypertension Father   . Heart attack Maternal Aunt     Social History Social History   Tobacco Use  . Smoking status: Never Smoker  . Smokeless tobacco: Never Used  Substance Use Topics  . Alcohol use: No  . Drug use: No     Allergies   Patient has no known allergies.   Review of Systems Review of Systems  Constitutional: Negative for fever.  HENT: Negative for congestion and sore throat.   Eyes: Negative.   Respiratory: Negative for chest tightness and shortness of breath.   Cardiovascular: Negative for chest pain.  Gastrointestinal: Positive for blood in stool. Negative for abdominal pain, constipation, nausea and vomiting.  Genitourinary: Negative.   Musculoskeletal: Negative for arthralgias, joint swelling and neck pain.  Skin: Negative.  Negative for rash and wound.  Neurological: Positive for weakness and light-headedness. Negative for dizziness, numbness and headaches.  Psychiatric/Behavioral: Negative.      Physical Exam Updated Vital Signs BP 129/85 (BP Location: Right Arm)   Pulse (!) 119   Temp 97.6 F (36.4 C) (Oral)   Resp 14   Ht 5\' 6"  (1.676 m)   Wt 91.2 kg   SpO2 100%   BMI 32.44 kg/m   Physical Exam Vitals signs and nursing note reviewed.  Constitutional:      Appearance: He is well-developed.  HENT:     Head: Normocephalic and atraumatic.     Mouth/Throat:     Mouth: Mucous membranes are moist.  Eyes:     Conjunctiva/sclera: Conjunctivae normal.     Comments: No conjunctival pallor  Neck:     Musculoskeletal: Normal range of motion.  Cardiovascular:     Rate and Rhythm: Normal rate and regular rhythm.     Heart sounds: Normal heart sounds.  Pulmonary:     Effort: Pulmonary effort is normal.     Breath sounds: Normal breath sounds. No wheezing.  Abdominal:     General: Bowel sounds are normal.     Palpations: Abdomen is soft.     Tenderness: There is no  abdominal tenderness.     Comments: No abdominal ttp, adipose abdomen, soft.  Rectal exam with bright red blood and significant blood clot from anus.   Musculoskeletal: Normal range of motion.  Skin:    General: Skin is warm and dry.  Neurological:     Mental Status: He is alert.      ED Treatments / Results  Labs (all labs ordered are listed, but only abnormal results are displayed) Labs Reviewed  CBC WITH DIFFERENTIAL/PLATELET - Abnormal; Notable for the following components:      Result Value   WBC 13.5 (*)    RBC 3.75 (*)    Hemoglobin  11.1 (*)    HCT 36.2 (*)    Neutro Abs 8.7 (*)    All other components within normal limits  PROTIME-INR  COMPREHENSIVE METABOLIC PANEL  TYPE AND SCREEN    EKG None  Radiology No results found.  Procedures Procedures (including critical care time)  Medications Ordered in ED Medications  sodium chloride 0.9 % bolus 500 mL (500 mLs Intravenous New Bag/Given 04/18/19 1554)     Initial Impression / Assessment and Plan / ED Course  I have reviewed the triage vital signs and the nursing notes.  Pertinent labs & imaging results that were available during my care of the patient were reviewed by me and considered in my medical decision making (see chart for details).        Hemoglobin 11.1, last comparable hemoglobin 12.1 but from 02/24/17.    Discussed pt with Dr. [name] who will consult patient.   Pending cmet results prior to calling for admission.  Discussed with Kem Parkinson PA who assumes care.   Final Clinical Impressions(s) / ED Diagnoses   Final diagnoses:  Acute GI bleeding    ED Discharge Orders    None       Landis Martins 04/18/19 1647    Nat Christen, MD 04/19/19 [phone]

## 2019-04-18 NOTE — ED Triage Notes (Signed)
Pt states that he has been passing bright red blood

## 2019-04-19 ENCOUNTER — Other Ambulatory Visit: Payer: Self-pay

## 2019-04-19 ENCOUNTER — Encounter (HOSPITAL_COMMUNITY)
Admission: EM | Disposition: A | Discharge status: Skilled Nursing Facility | Payer: Self-pay | Source: Home / Self Care | Attending: Family Medicine

## 2019-04-19 ENCOUNTER — Encounter (HOSPITAL_COMMUNITY): Payer: Self-pay | Admitting: Anesthesiology

## 2019-04-19 ENCOUNTER — Observation Stay (HOSPITAL_COMMUNITY): Payer: Medicare HMO | Admitting: Anesthesiology

## 2019-04-19 ENCOUNTER — Observation Stay (HOSPITAL_COMMUNITY): Payer: Medicare HMO

## 2019-04-19 DIAGNOSIS — K921 Melena: Secondary | ICD-10-CM | POA: Diagnosis not present

## 2019-04-19 DIAGNOSIS — K635 Polyp of colon: Secondary | ICD-10-CM | POA: Diagnosis not present

## 2019-04-19 DIAGNOSIS — D649 Anemia, unspecified: Secondary | ICD-10-CM | POA: Diagnosis not present

## 2019-04-19 DIAGNOSIS — D175 Benign lipomatous neoplasm of intra-abdominal organs: Secondary | ICD-10-CM | POA: Diagnosis not present

## 2019-04-19 DIAGNOSIS — K625 Hemorrhage of anus and rectum: Secondary | ICD-10-CM | POA: Diagnosis not present

## 2019-04-19 DIAGNOSIS — K838 Other specified diseases of biliary tract: Secondary | ICD-10-CM | POA: Diagnosis not present

## 2019-04-19 HISTORY — PX: POLYPECTOMY: SHX5525

## 2019-04-19 HISTORY — PX: COLONOSCOPY WITH PROPOFOL: SHX5780

## 2019-04-19 LAB — CBC
HCT: 28.9 % — ABNORMAL LOW (ref 39.0–52.0)
Hemoglobin: 9.1 g/dL — ABNORMAL LOW (ref 13.0–17.0)
MCH: 29.5 pg (ref 26.0–34.0)
MCHC: 31.5 g/dL (ref 30.0–36.0)
MCV: 93.8 fL (ref 80.0–100.0)
Platelets: 225 10*3/uL (ref 150–400)
RBC: 3.08 MIL/uL — ABNORMAL LOW (ref 4.22–5.81)
RDW: 14.5 % (ref 11.5–15.5)
WBC: 8.2 10*3/uL (ref 4.0–10.5)
nRBC: 0 % (ref 0.0–0.2)

## 2019-04-19 LAB — COMPREHENSIVE METABOLIC PANEL
ALT: 25 U/L (ref 0–44)
AST: 22 U/L (ref 15–41)
Albumin: 3.1 g/dL — ABNORMAL LOW (ref 3.5–5.0)
Alkaline Phosphatase: 94 U/L (ref 38–126)
Anion gap: 8 (ref 5–15)
BUN: 20 mg/dL (ref 8–23)
CO2: 27 mmol/L (ref 22–32)
Calcium: 8.1 mg/dL — ABNORMAL LOW (ref 8.9–10.3)
Chloride: 108 mmol/L (ref 98–111)
Creatinine, Ser: 1.18 mg/dL (ref 0.61–1.24)
GFR calc Af Amer: >60 mL/min (ref >60)
GFR calc non Af Amer: 58 mL/min — ABNORMAL LOW (ref >60)
Glucose, Bld: 143 mg/dL — ABNORMAL HIGH (ref 70–99)
Potassium: 4 mmol/L (ref 3.5–5.1)
Sodium: 143 mmol/L (ref 135–145)
Total Bilirubin: 0.7 mg/dL (ref 0.3–1.2)
Total Protein: 5.7 g/dL — ABNORMAL LOW (ref 6.5–8.1)

## 2019-04-19 LAB — GLUCOSE, CAPILLARY
Glucose-Capillary: 103 mg/dL — ABNORMAL HIGH (ref 70–99)
Glucose-Capillary: 104 mg/dL — ABNORMAL HIGH (ref 70–99)
Glucose-Capillary: 113 mg/dL — ABNORMAL HIGH (ref 70–99)
Glucose-Capillary: 133 mg/dL — ABNORMAL HIGH (ref 70–99)
Glucose-Capillary: 155 mg/dL — ABNORMAL HIGH (ref 70–99)
Glucose-Capillary: 156 mg/dL — ABNORMAL HIGH (ref 70–99)
Glucose-Capillary: 180 mg/dL — ABNORMAL HIGH (ref 70–99)

## 2019-04-19 LAB — TYPE AND SCREEN
ABO/RH(D): B POS
Antibody Screen: POSITIVE

## 2019-04-19 SURGERY — COLONOSCOPY WITH PROPOFOL
Anesthesia: General

## 2019-04-19 MED ORDER — PROPOFOL 500 MG/50ML IV EMUL
INTRAVENOUS | Status: DC | PRN
  Administered 2019-04-19: 16:00:00 via INTRAVENOUS
  Administered 2019-04-19: 150 ug/kg/min via INTRAVENOUS

## 2019-04-19 MED ORDER — IOPAMIDOL (ISOVUE-300) INJECTION 61%: 100.0000 mL | Freq: Once | INTRAVENOUS | Status: DC | PRN

## 2019-04-19 MED ORDER — KETAMINE HCL 10 MG/ML IJ SOLN
INTRAMUSCULAR | Status: DC | PRN
  Administered 2019-04-19 (×2): 10 mg via INTRAVENOUS

## 2019-04-19 MED ORDER — BISACODYL 5 MG PO TBEC
10.0000 mg | DELAYED_RELEASE_TABLET | Freq: Once | ORAL | Status: AC
  Administered 2019-04-19: 10 mg via ORAL
  Filled 2019-04-19: qty 2

## 2019-04-19 MED ORDER — SODIUM CHLORIDE 0.9 % IV SOLN: INTRAVENOUS | Status: DC

## 2019-04-19 MED ORDER — HYDROMORPHONE HCL 1 MG/ML IJ SOLN: 0.2500 mg | INTRAMUSCULAR | Status: DC | PRN

## 2019-04-19 MED ORDER — LIDOCAINE HCL (CARDIAC) PF 100 MG/5ML IV SOSY
PREFILLED_SYRINGE | INTRAVENOUS | Status: DC | PRN
  Administered 2019-04-19: 40 mg via INTRAVENOUS

## 2019-04-19 MED ORDER — HYDROCODONE-ACETAMINOPHEN 7.5-325 MG PO TABS: 1.0000 | ORAL_TABLET | Freq: Once | ORAL | Status: DC | PRN

## 2019-04-19 MED ORDER — STERILE WATER FOR IRRIGATION IR SOLN
Status: DC | PRN
  Administered 2019-04-19: 100 mL

## 2019-04-19 MED ORDER — SPOT INK MARKER SYRINGE KIT
PACK | SUBMUCOSAL | Status: DC | PRN
  Administered 2019-04-19: 3 mL via SUBMUCOSAL

## 2019-04-19 MED ORDER — MIDAZOLAM HCL 2 MG/2ML IJ SOLN: 0.5000 mg | Freq: Once | INTRAMUSCULAR | Status: DC | PRN

## 2019-04-19 MED ORDER — INSULIN ASPART 100 UNIT/ML ~~LOC~~ SOLN
0.0000 [IU] | Freq: Three times a day (TID) | SUBCUTANEOUS | Status: DC
  Administered 2019-04-20: 12:00:00 2 [IU] via SUBCUTANEOUS
  Administered 2019-04-20: 1 [IU] via SUBCUTANEOUS
  Administered 2019-04-21 (×2): 2 [IU] via SUBCUTANEOUS
  Administered 2019-04-22 (×2): 1 [IU] via SUBCUTANEOUS

## 2019-04-19 MED ORDER — PROMETHAZINE HCL 25 MG/ML IJ SOLN: 6.2500 mg | INTRAMUSCULAR | Status: DC | PRN

## 2019-04-19 MED ORDER — LACTATED RINGERS IV SOLN
INTRAVENOUS | Status: DC
  Administered 2019-04-19: 16:00:00 via INTRAVENOUS

## 2019-04-19 MED ORDER — EPHEDRINE SULFATE 50 MG/ML IJ SOLN
INTRAMUSCULAR | Status: DC | PRN
  Administered 2019-04-19 (×2): 10 mg via INTRAVENOUS

## 2019-04-19 MED ORDER — IOHEXOL 300 MG/ML  SOLN
100.0000 mL | Freq: Once | INTRAMUSCULAR | Status: AC | PRN
  Administered 2019-04-19: 100 mL via INTRAVENOUS

## 2019-04-19 MED ORDER — SPOT INK MARKER SYRINGE KIT
PACK | SUBMUCOSAL | Status: AC
  Filled 2019-04-19: qty 5

## 2019-04-19 MED ORDER — PROPOFOL 10 MG/ML IV BOLUS
INTRAVENOUS | Status: DC | PRN
  Administered 2019-04-19: 20 mg via INTRAVENOUS

## 2019-04-19 MED ORDER — GLYCOPYRROLATE 0.2 MG/ML IJ SOLN
INTRAMUSCULAR | Status: DC | PRN
  Administered 2019-04-19: 0.2 mg via INTRAVENOUS

## 2019-04-19 NOTE — Progress Notes (Signed)
PROGRESS NOTE    Stephen MCMANAMON Sr.  EXH:[id number]  DOB: 04/30/1941  DOA: 04/18/2019 PCP: [name], MD   Brief Admission Hx: 78 y.o. male admitted with painless rectal bleeding.   MDM/Assessment & Plan:   1. Painless rectal bleeding - Pt says that he continues to have rectal bleeding but it has slowed down.  GI team consulted for further work up.  Monitor CBC.  Hold aspirin.  2. Acute blood loss anemia - GI work up in progress.  Follow Hg.   3. Leukocytosis - resolved.  4. Type 2 DM - continue sliding scale coverage and CBG testing.  5. Hyperlipidemia - atorvastatin 40 mg daily ordered.   DVT prophylaxis: SCDs Code Status: Full  Family Communication: sister updated telephone  Disposition Plan:    Consultants:  GI  Procedures:    Antimicrobials:     Subjective: Pt says he had some bleeding earlier this morning but nothing since that time, abdominal discomfort seems to be better.    Objective: Vitals:   04/18/19 1840 04/18/19 1950 04/19/19 0611 04/19/19 0812  BP: (!) 118/104 (!) 162/97 111/62   Pulse: (!) 104 (!) 101 82   Resp: 12 20 16    Temp:  97.9 F (36.6 C) 98.6 F (37 C)   TempSrc:  Oral Oral   SpO2: 98% 98% 96% 96%  Weight:   95.6 kg   Height:        Intake/Output Summary (Last 24 hours) at 04/19/2019 1130 Last data filed at 04/19/2019 0300 Gross per 24 hour  Intake 1195.69 ml  Output -  Net 1195.69 ml   Filed Weights   04/18/19 1518 04/19/19 0611  Weight: 91.2 kg 95.6 kg     REVIEW OF SYSTEMS  As per history otherwise all reviewed and reported negative  Exam:  General exam: awake, alert, NAD, cooperative.   Respiratory system: Clear. No increased work of breathing. Cardiovascular system: S1 & S2 heard. No JVD, murmurs, gallops, clicks or pedal edema. Gastrointestinal system: Abdomen is nondistended, soft and nontender. Normal bowel sounds heard. Central nervous system: Alert and oriented. No focal neurological deficits.  Extremities: no CCE.  Data Reviewed: Basic Metabolic Panel: Recent Labs  Lab 04/18/19 1545 04/19/19 0424  NA 142 143  K 4.4 4.0  CL 106 108  CO2 21* 27  GLUCOSE 191* 143*  BUN 21 20  CREATININE 1.63* 1.18  CALCIUM 8.6* 8.1*   Liver Function Tests: Recent Labs  Lab 04/18/19 1545 04/19/19 0424  AST 31 22  ALT 25 25  ALKPHOS 119 94  BILITOT 0.7 0.7  PROT 6.4* 5.7*  ALBUMIN 3.6 3.1*   No results for input(s): LIPASE, AMYLASE in the last 168 hours. No results for input(s): AMMONIA in the last 168 hours. CBC: Recent Labs  Lab 04/18/19 1545 04/18/19 2338 04/19/19 0424  WBC 13.5* 10.6* 8.2  NEUTROABS 8.7*  --   --   HGB 11.1* 9.5* 9.1*  HCT 36.2* 30.9* 28.9*  MCV 96.5 96.3 93.8  PLT 288 234 225   Cardiac Enzymes: No results for input(s): CKTOTAL, CKMB, CKMBINDEX, TROPONINI in the last 168 hours. CBG (last 3)  Recent Labs    04/19/19 0419 04/19/19 0724 04/19/19 1112  GLUCAP 133* 103* 156*   Recent Results (from the past 240 hour(s))  SARS Coronavirus 2 (CEPHEID - Performed in [facility] hospital lab), Hosp Order     Status: None   Collection Time: 04/18/19  5:08 PM  Result Value Ref Range Status  SARS Coronavirus 2 NEGATIVE NEGATIVE Final    Comment: (NOTE) If result is NEGATIVE SARS-CoV-2 target nucleic acids are NOT DETECTED. The SARS-CoV-2 RNA is generally detectable in upper and lower  respiratory specimens during the acute phase of infection. The lowest  concentration of SARS-CoV-2 viral copies this assay can detect is 250  copies / mL. A negative result does not preclude SARS-CoV-2 infection  and should not be used as the sole basis for treatment or other  patient management decisions.  A negative result may occur with  improper specimen collection / handling, submission of specimen other  than nasopharyngeal swab, presence of viral mutation(s) within the  areas targeted by this assay, and inadequate number of viral copies  (<250 copies / mL). A  negative result must be combined with clinical  observations, patient history, and epidemiological information. If result is POSITIVE SARS-CoV-2 target nucleic acids are DETECTED. The SARS-CoV-2 RNA is generally detectable in upper and lower  respiratory specimens dur ing the acute phase of infection.  Positive  results are indicative of active infection with SARS-CoV-2.  Clinical  correlation with patient history and other diagnostic information is  necessary to determine patient infection status.  Positive results do  not rule out bacterial infection or co-infection with other viruses. If result is PRESUMPTIVE POSTIVE SARS-CoV-2 nucleic acids MAY BE PRESENT.   A presumptive positive result was obtained on the submitted specimen  and confirmed on repeat testing.  While 2019 novel coronavirus  (SARS-CoV-2) nucleic acids may be present in the submitted sample  additional confirmatory testing may be necessary for epidemiological  and / or clinical management purposes  to differentiate between  SARS-CoV-2 and other Sarbecovirus currently known to infect humans.  If clinically indicated additional testing with an alternate test  methodology [phone]) is advised. The SARS-CoV-2 RNA is generally  detectable in upper and lower respiratory sp ecimens during the acute  phase of infection. The expected result is Negative. Fact Sheet for Patients:  StrictlyIdeas.no Fact Sheet for Healthcare Providers: BankingDealers.co.za This test is not yet approved or cleared by the [place] FDA and has been authorized for detection and/or diagnosis of SARS-CoV-2 by FDA under an Emergency Use Authorization (EUA).  This EUA will remain in effect (meaning this test can be used) for the duration of the COVID-19 declaration under Section 564(b)(1) of the Act, 21 U.S.C. section 360bbb-3(b)(1), unless the authorization is terminated or revoked sooner. Performed  at [facility], [address], [place], [place] [address]      Studies: No results found.   Scheduled Meds: . atorvastatin  40 mg Oral q1800  . bisacodyl  10 mg Oral Once  . hydrALAZINE  100 mg Oral BID  . insulin aspart  0-9 Units Subcutaneous Q4H  . lisinopril  40 mg Oral Daily  . verapamil  240 mg Oral QHS   Continuous Infusions:  Principal Problem:   Rectal bleeding Active Problems:   Diabetes mellitus ([place])   CKD (chronic kidney disease), stage III ([facility])   Anemia   Time spent:   Irwin Brakeman, MD [facility] 04/19/2019, 11:30 AM    LOS: 0 days  How to contact the [facility] Attending or Consulting provider [place] or covering provider during after hours [place], for this patient?  1. Check the care team in [facility] and look for a) attending/consulting [facility] provider listed and b) the [facility] team listed 2. Log into [email or web address] and use [place]'s universal password to access. If you  do not have the password, please contact the hospital operator. 3. Locate the [facility] provider you are looking for under [facility] and page to a number that you can be directly reached. 4. If you still have difficulty reaching the provider, please page the [facility] (Director on Call) for the Hospitalists listed on amion for assistance.

## 2019-04-19 NOTE — Interval H&P Note (Signed)
History and Physical Interval Note:  04/19/2019 3:59 PM  Stephen Bales Sr.  has presented today for surgery, with the diagnosis of hematochezia, anemia.  The various methods of treatment have been discussed with the patient and family. After consideration of risks, benefits and other options for treatment, the patient has consented to  Procedure(s): COLONOSCOPY WITH PROPOFOL (N/A) as a surgical intervention.  The patient's history has been reviewed, patient examined, no change in status, stable for surgery.  I have reviewed the patient's chart and labs.  Questions were answered to the patient's satisfaction.     Manus Rudd  Patient stable in short stay.  Hemoglobin this morning 9.1.  Colonoscopy per plan.

## 2019-04-19 NOTE — Anesthesia Postprocedure Evaluation (Signed)
Anesthesia Post Note  Patient: Stephen Bales Sr.  Procedure(s) Performed: COLONOSCOPY WITH PROPOFOL (N/A ) POLYPECTOMY  Patient location during evaluation: PACU Anesthesia Type: General Level of consciousness: awake, oriented and patient cooperative Pain management: pain level controlled Vital Signs Assessment: post-procedure vital signs reviewed and stable Respiratory status: spontaneous breathing Cardiovascular status: stable Postop Assessment: no apparent nausea or vomiting Anesthetic complications: no     Last Vitals:  Vitals:   04/19/19 1545 04/19/19 1643  BP: (!) 163/90 104/67  Pulse: 90 (!) 103  Resp: 17 16  Temp: 36.5 C (!) 36.1 C  SpO2: 98% 92%    Last Pain:  Vitals:   04/19/19 1643  TempSrc:   PainSc: 0-No pain                 Branson Kranz A

## 2019-04-19 NOTE — Care Management Obs Status (Signed)
[facility] NOTIFICATION   Patient Details  Name: Stephen SEIDMAN Sr. MRN: [id number] Date of Birth: 1941/06/21   Medicare Observation Status Notification Given:  Yes    Sherald Barge, RN 04/19/2019, 10:48 AM

## 2019-04-19 NOTE — Transfer of Care (Signed)
Immediate Anesthesia Transfer of Care Note  Patient: Stephen Bales Sr.  Procedure(s) Performed: COLONOSCOPY WITH PROPOFOL (N/A ) POLYPECTOMY  Patient Location: PACU  Anesthesia Type:General  Level of Consciousness: awake, oriented and patient cooperative  Airway & Oxygen Therapy: Patient Spontanous Breathing and Patient connected to nasal cannula oxygen  Post-op Assessment: Report given to RN and Post -op Vital signs reviewed and stable  Post vital signs: Reviewed and stable  Last Vitals:  Vitals Value Taken Time  BP 101/72 04/19/2019  4:45 PM  Temp 36.1 C 04/19/2019  4:43 PM  Pulse 97 04/19/2019  4:50 PM  Resp 17 04/19/2019  4:50 PM  SpO2 92 % 04/19/2019  4:50 PM  Vitals shown include unvalidated device data.  Last Pain:  Vitals:   04/19/19 1643  TempSrc:   PainSc: 0-No pain         Complications: No apparent anesthesia complications

## 2019-04-19 NOTE — Op Note (Signed)
[facility] Patient Name: Stephen Rogers Procedure Date: 04/19/2019 3:39 PM MRN: [id number] Date of Birth: 05/25/1941 Attending MD: Norvel Richards , MD CSN: [id number] Age: 78 Admit Type: Outpatient Procedure:                Colonoscopy Indications:              Hematochezia Providers:                Norvel Richards, MD, Rosina Lowenstein, RN, Aram Candela Referring MD:             [name] MD, MD Medicines:                Propofol per Anesthesia Complications:            No immediate complications. Estimated Blood Loss:     Estimated blood loss was minimal. Procedure:                Pre-Anesthesia Assessment:                           - Prior to the procedure, a History and Physical                            was performed, and patient medications and                            allergies were reviewed. The patient's tolerance of                            previous anesthesia was also reviewed. The risks                            and benefits of the procedure and the sedation                            options and risks were discussed with the patient.                            All questions were answered, and informed consent                            was obtained. Prior Anticoagulants: The patient has                            taken no previous anticoagulant or antiplatelet                            agents. ASA Grade Assessment: III - A patient with                            severe systemic disease. After reviewing the risks  and benefits, the patient was deemed in                            satisfactory condition to undergo the procedure.                           After obtaining informed consent, the colonoscope                            was passed under direct vision. Throughout the                            procedure, the patient's blood pressure, pulse, and                            oxygen  saturations were monitored continuously. The                            [id number]-[id number] ([id number]) scope was introduced through                            the and advanced to the the cecum, identified by                            appendiceal orifice and ileocecal valve. The                            colonoscopy was performed without difficulty. The                            patient tolerated the procedure well. The quality                            of the bowel preparation was adequate. The entire                            colon was well visualized. Scope In: 4:06:27 PM Scope Out: 4:35:03 PM Total Procedure Duration: 0 hours 28 minutes 36 seconds  Findings:      The perianal and digital rectal examinations were normal.      A 4 mm polyp was found in the recto-sigmoid colon. The polyp was       sessile. The polyp was removed with a cold snare. Resection and       retrieval were complete. Estimated blood loss was minimal. 1.5 cm       mucosal nodule opposite ileocecal valve. (Positive pillow sign       consistent with a lipoma).      In the vicinity of the hepatic flexure there was a very unusual       appearing deeply invaginated area with markedly abnormal mucosa.       Appeared to track cephalad, producing a extrinsic bulge / compression on       the colon wall for a good 5 to 6 cm. This defect was at least 3 to 4 cm       deep. In the center of  it, it appeared to have foreign material. It       actually looked as though there was a quarter of a tangerine in the base       of this area. Adjacent to it, there was approximately 10 mm round area       of what appeared to be gelatinous clot present. This did not have the       appearance of a typical neoplasm. The remainder of the colonic mucosa       appeared normal. There was not a single diverticulum seen.      The abnormality at the hepatic flexure was demarcated with ink       immediately beyond the distal extent of it. Please see  photographs. Impression:               - One 4 mm polyp at the recto-sigmoid colon,                            removed with a cold snare. Resected and retrieved.                            Colonic lipoma.                           Markedly abnormal area of colon at the hepatic                            flexure of uncertain etiology. (Query fistula,                            atypical presentation for infiltrating tumor or                            possibly related to previously placed                            cholecystostomy tube). This area is suspicious for                            site of bleeding. Moderate Sedation:      Moderate (conscious) sedation was personally administered by an       anesthesia professional. The following parameters were monitored: oxygen       saturation, heart rate, blood pressure, respiratory rate, EKG, adequacy       of pulmonary ventilation, and response to care. Recommendation:           - Return patient to hospital ward for ongoing care.                           - Clear liquid diet.                           - Continue present medications. No repeat                            colonoscopy recommended. Proceed with a contrast CT  of the abdomen pelvis to further evaluate abnormal                            colon. I have discussed my findings and                            recommendations with the patient's sister, Katharine Look                            Ghan at [phone]. May need a surgery                            consultation. I will follow-up on polyp pathology. Procedure Code(s):        --- Professional ---                           [phone], Colonoscopy, flexible; with removal of                            tumor(s), polyp(s), or other lesion(s) by snare                            technique Diagnosis Code(s):        --- Professional ---                           K63.5, Polyp of colon                           K92.1,  Melena (includes Hematochezia) CPT copyright 2019 American Medical Association. All rights reserved. The codes documented in this report are preliminary and upon coder review may  be revised to meet current compliance requirements. Cristopher Estimable. Beronica Lansdale, MD Norvel Richards, MD 04/19/2019 5:20:03 PM This report has been signed electronically. Number of Addenda: 0

## 2019-04-19 NOTE — Anesthesia Preprocedure Evaluation (Signed)
Anesthesia Evaluation  Patient identified by MRN, date of birth, ID band Patient awake    Reviewed: Allergy & Precautions, NPO status , Patient's Chart, lab work & pertinent test results  Airway Mallampati: II  TM Distance: >3 FB Neck ROM: Full    Dental no notable dental hx. (+) Teeth Intact   Pulmonary neg pulmonary ROS,    Pulmonary exam normal breath sounds clear to auscultation       Cardiovascular Exercise Tolerance: Good hypertension, Pt. on medications negative cardio ROS Normal cardiovascular examI Rhythm:Regular Rate:Normal     Neuro/Psych negative neurological ROS  negative psych ROS   GI/Hepatic negative GI ROS, Neg liver ROS,   Endo/Other  negative endocrine ROSdiabetes, Well Controlled, Type 2, Oral Hypoglycemic Agents  Renal/GU Renal InsufficiencyRenal disease  negative genitourinary   Musculoskeletal negative musculoskeletal ROS (+)   Abdominal   Peds negative pediatric ROS (+)  Hematology negative hematology ROS (+) anemia , Pt here for colonoscopy for rectal bleeding  H/H decreased from ~11 to 9. Reports blood loss is lessening    Anesthesia Other Findings   Reproductive/Obstetrics negative OB ROS                             Anesthesia Physical Anesthesia Plan  ASA: III  Anesthesia Plan: General   Post-op Pain Management:    Induction: Intravenous  PONV Risk Score and Plan:   Airway Management Planned: Nasal Cannula and Simple Face Mask  Additional Equipment:   Intra-op Plan:   Post-operative Plan:   Informed Consent: I have reviewed the patients History and Physical, chart, labs and discussed the procedure including the risks, benefits and alternatives for the proposed anesthesia with the patient or authorized representative who has indicated his/her understanding and acceptance.     Dental advisory given  Plan Discussed with: CRNA  Anesthesia  Plan Comments: (Plan Full PPE use  Plan GA with GETA back up as needed -WTP same. )        Anesthesia Quick Evaluation

## 2019-04-20 ENCOUNTER — Encounter (HOSPITAL_COMMUNITY): Payer: Self-pay | Admitting: Internal Medicine

## 2019-04-20 DIAGNOSIS — Z7984 Long term (current) use of oral hypoglycemic drugs: Secondary | ICD-10-CM | POA: Diagnosis not present

## 2019-04-20 DIAGNOSIS — K921 Melena: Secondary | ICD-10-CM | POA: Diagnosis present

## 2019-04-20 DIAGNOSIS — Z86718 Personal history of other venous thrombosis and embolism: Secondary | ICD-10-CM | POA: Diagnosis not present

## 2019-04-20 DIAGNOSIS — K823 Fistula of gallbladder: Secondary | ICD-10-CM | POA: Diagnosis not present

## 2019-04-20 DIAGNOSIS — E876 Hypokalemia: Secondary | ICD-10-CM | POA: Diagnosis not present

## 2019-04-20 DIAGNOSIS — Z7982 Long term (current) use of aspirin: Secondary | ICD-10-CM | POA: Diagnosis not present

## 2019-04-20 DIAGNOSIS — E875 Hyperkalemia: Secondary | ICD-10-CM | POA: Diagnosis not present

## 2019-04-20 DIAGNOSIS — D649 Anemia, unspecified: Secondary | ICD-10-CM | POA: Diagnosis not present

## 2019-04-20 DIAGNOSIS — K567 Ileus, unspecified: Secondary | ICD-10-CM | POA: Diagnosis not present

## 2019-04-20 DIAGNOSIS — K632 Fistula of intestine: Secondary | ICD-10-CM | POA: Diagnosis present

## 2019-04-20 DIAGNOSIS — N179 Acute kidney failure, unspecified: Secondary | ICD-10-CM | POA: Diagnosis not present

## 2019-04-20 DIAGNOSIS — Z1159 Encounter for screening for other viral diseases: Secondary | ICD-10-CM | POA: Diagnosis not present

## 2019-04-20 DIAGNOSIS — M1712 Unilateral primary osteoarthritis, left knee: Secondary | ICD-10-CM | POA: Diagnosis present

## 2019-04-20 DIAGNOSIS — I129 Hypertensive chronic kidney disease with stage 1 through stage 4 chronic kidney disease, or unspecified chronic kidney disease: Secondary | ICD-10-CM | POA: Diagnosis present

## 2019-04-20 DIAGNOSIS — K625 Hemorrhage of anus and rectum: Secondary | ICD-10-CM | POA: Diagnosis not present

## 2019-04-20 DIAGNOSIS — Z5320 Procedure and treatment not carried out because of patient's decision for unspecified reasons: Secondary | ICD-10-CM | POA: Diagnosis not present

## 2019-04-20 DIAGNOSIS — Z8719 Personal history of other diseases of the digestive system: Secondary | ICD-10-CM | POA: Diagnosis not present

## 2019-04-20 DIAGNOSIS — K922 Gastrointestinal hemorrhage, unspecified: Secondary | ICD-10-CM | POA: Diagnosis present

## 2019-04-20 DIAGNOSIS — E1122 Type 2 diabetes mellitus with diabetic chronic kidney disease: Secondary | ICD-10-CM | POA: Diagnosis present

## 2019-04-20 DIAGNOSIS — N183 Chronic kidney disease, stage 3 (moderate): Secondary | ICD-10-CM | POA: Diagnosis present

## 2019-04-20 DIAGNOSIS — K8010 Calculus of gallbladder with chronic cholecystitis without obstruction: Secondary | ICD-10-CM | POA: Diagnosis present

## 2019-04-20 DIAGNOSIS — K5730 Diverticulosis of large intestine without perforation or abscess without bleeding: Secondary | ICD-10-CM | POA: Diagnosis present

## 2019-04-20 DIAGNOSIS — D127 Benign neoplasm of rectosigmoid junction: Secondary | ICD-10-CM | POA: Diagnosis present

## 2019-04-20 DIAGNOSIS — D62 Acute posthemorrhagic anemia: Secondary | ICD-10-CM | POA: Diagnosis present

## 2019-04-20 DIAGNOSIS — R000 Tachycardia, unspecified: Secondary | ICD-10-CM | POA: Diagnosis not present

## 2019-04-20 DIAGNOSIS — J9811 Atelectasis: Secondary | ICD-10-CM | POA: Diagnosis not present

## 2019-04-20 DIAGNOSIS — Z79899 Other long term (current) drug therapy: Secondary | ICD-10-CM | POA: Diagnosis not present

## 2019-04-20 DIAGNOSIS — E785 Hyperlipidemia, unspecified: Secondary | ICD-10-CM | POA: Diagnosis present

## 2019-04-20 LAB — GLUCOSE, CAPILLARY
Glucose-Capillary: 80 mg/dL (ref 70–99)
Glucose-Capillary: 130 mg/dL — ABNORMAL HIGH (ref 70–99)
Glucose-Capillary: 118 mg/dL — ABNORMAL HIGH (ref 70–99)
Glucose-Capillary: 124 mg/dL — ABNORMAL HIGH (ref 70–99)
Glucose-Capillary: 153 mg/dL — ABNORMAL HIGH (ref 70–99)
Glucose-Capillary: 83 mg/dL (ref 70–99)
Glucose-Capillary: 108 mg/dL — ABNORMAL HIGH (ref 70–99)

## 2019-04-20 LAB — COMPREHENSIVE METABOLIC PANEL
ALT: 71 U/L — ABNORMAL HIGH (ref 0–44)
AST: 51 U/L — ABNORMAL HIGH (ref 15–41)
Albumin: 3.3 g/dL — ABNORMAL LOW (ref 3.5–5.0)
Alkaline Phosphatase: 109 U/L (ref 38–126)
Anion gap: 9 (ref 5–15)
BUN: 12 mg/dL (ref 8–23)
CO2: 23 mmol/L (ref 22–32)
Calcium: 8.2 mg/dL — ABNORMAL LOW (ref 8.9–10.3)
Chloride: 106 mmol/L (ref 98–111)
Creatinine, Ser: 1.15 mg/dL (ref 0.61–1.24)
GFR calc Af Amer: >60 mL/min (ref >60)
GFR calc non Af Amer: >60 mL/min (ref >60)
Glucose, Bld: 147 mg/dL — ABNORMAL HIGH (ref 70–99)
Potassium: 4.2 mmol/L (ref 3.5–5.1)
Sodium: 138 mmol/L (ref 135–145)
Total Bilirubin: 0.5 mg/dL (ref 0.3–1.2)
Total Protein: 6.1 g/dL — ABNORMAL LOW (ref 6.5–8.1)

## 2019-04-20 LAB — CBC
HCT: 28.6 % — ABNORMAL LOW (ref 39.0–52.0)
Hemoglobin: 9 g/dL — ABNORMAL LOW (ref 13.0–17.0)
MCH: 29.4 pg (ref 26.0–34.0)
MCHC: 31.5 g/dL (ref 30.0–36.0)
MCV: 93.5 fL (ref 80.0–100.0)
Platelets: 219 10*3/uL (ref 150–400)
RBC: 3.06 MIL/uL — ABNORMAL LOW (ref 4.22–5.81)
RDW: 14.3 % (ref 11.5–15.5)
WBC: 6.4 10*3/uL (ref 4.0–10.5)
nRBC: 0 % (ref 0.0–0.2)

## 2019-04-20 NOTE — Progress Notes (Addendum)
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  Fistula likely explains bleeding. Recommend surgical consultation.  I have paged Dr. Wynetta Emery to discuss. Incidental observation of dilated bile duct with normal LFTs..  Discussed with Dr. [name] and communicated with Dr. Wynetta Emery.

## 2019-04-20 NOTE — TOC Initial Note (Signed)
Transition of Care (TOC) - Initial/Assessment Note    Patient Details  Name: Stephen JELLEY Sr. MRN: [id number] Date of Birth: [birth year]  Transition of Care [facility]) CM/SW Contact:    Boneta Lucks, RN Phone Number: 04/20/2019, 3:00 PM  Clinical Narrative:         Patient admitted for rectal bleeding. Patient is schedule for Surgery on Monday. NO barriers identified. TOC will follow.           Expected Discharge Plan: [place] Barriers to Discharge: Continued Medical Work up   Patient Goals and CMS Choice        Expected Discharge Plan and Services Expected Discharge Plan: [place]   Discharge Planning Services: CM Consult                                          Prior Living Arrangements/Services                       Activities of Daily Living Home Assistive Devices/Equipment: None ADL Screening (condition at time of admission) Patient's cognitive ability adequate to safely complete daily activities?: Yes Is the patient deaf or have difficulty hearing?: No Does the patient have difficulty seeing, even when wearing glasses/contacts?: No Does the patient have difficulty concentrating, remembering, or making decisions?: No Patient able to express need for assistance with ADLs?: Yes Does the patient have difficulty dressing or bathing?: No Independently performs ADLs?: Yes (appropriate for developmental age) Does the patient have difficulty walking or climbing stairs?: No Weakness of Legs: None Weakness of Arms/Hands: None  Permission Sought/Granted                  Emotional Assessment              Admission diagnosis:  Acute GI bleeding [K92.2] Patient Active Problem List   Diagnosis Date Noted  . Rectal bleeding 04/18/2019  . Anemia 04/18/2019  . Acute GI bleeding   . Cholecystitis   . Fever   . At high risk for fluid overload 10/13/2016  . Fluid overload 10/13/2016  . Bilateral lower  extremity edema 10/13/2016  . Gait disturbance 10/13/2016  . Leukocytosis 10/13/2016  . CKD (chronic kidney disease), stage III ([place]) 10/13/2016  . Left leg DVT ([place]) 10/13/2016  . Acute renal failure (ARF) ([place]) 10/08/2016  . Nausea and vomiting 10/08/2016  . Diabetes mellitus ([place]) 10/08/2016  . Transaminitis 10/08/2016  . Hyperlipemia 10/08/2016  . Renal insufficiency   . Dehydration 10/07/2016   PCP:  [name], MD Pharmacy:   [facility], [address] [place] [address] Phone: [phone] Fax: [phone]  [facility] [address] [place] - [place] [address] [place] [address] [place] [address] [place] [address] Phone: [phone] Fax: [phone]         Readmission Risk Interventions No flowsheet data found.

## 2019-04-20 NOTE — Addendum Note (Signed)
Addendum  created 04/20/19 0854 by Mickel Baas, CRNA   Charge Capture section accepted, Visit diagnoses modified

## 2019-04-20 NOTE — Consult Note (Signed)
Reason for Consult:Cholecolo fistula Referring Physician: Dr. Wynetta Emery, Stephen Setters Sr. is an 78 y.o. male.  HPI: Stephen Rogers is a 25 year old black male status post percutaneous cholecystostomy tube placement for acute cholecystitis in 2017 who presented to the hospital with hematochezia.  A CT scan of the abdomen was performed which revealed a possible fistula between the gallbladder lumen and the hepatic flexure of the colon.  The Stephen Rogers underwent a colonoscopy by Dr. [name] and was found to have clot and fluid in the inflamed area in the hepatic flexure.  Stephen Rogers states he has had 2 gallbladder attacks in the past.  He recently has not had right upper quadrant abdominal pain, nausea, or vomiting.  He denies any fever or chills.  Past Medical History:  Diagnosis Date  . CKD (chronic kidney disease), stage III ([place])   . Diabetes mellitus   . DVT (deep venous thrombosis) ([place]) 10/14/2016  . Extremity edema 09/2016  . Hyperlipemia   . Hypertension     Past Surgical History:  Procedure Laterality Date  . COLONOSCOPY  02/2010   Dr. [name]: diverticuolosis, multiple polyps removed, cecal adenoma.   . IR GENERIC HISTORICAL  10/19/2016   IR PERC CHOLECYSTOSTOMY 10/19/2016 [name], MD [facility]  . IR GENERIC HISTORICAL  10/25/2016   IR CATHETER TUBE CHANGE 10/25/2016 [name], MD [facility]  . IR GENERIC HISTORICAL  11/30/2016   IR RADIOLOGIST EVAL & MGMT 11/30/2016 [facility]    Family History  Problem Relation Age of Onset  . Heart attack Other   . Tuberculosis Other   . Tuberculosis Other   . Diabetes type II Father   . Hypertension Father   . Heart attack Maternal Aunt     Social History:  reports that he has never smoked. He has never used smokeless tobacco. He reports that he does not drink alcohol or use drugs.  Allergies: No Known Allergies  Medications: I have reviewed the Stephen Rogers's current medications.  Results for orders placed or performed  during the hospital encounter of 04/18/19 (from the past 48 hour(s))  Comprehensive metabolic panel     Status: Abnormal   Collection Time: 04/18/19  3:45 PM  Result Value Ref Range   Sodium 142 135 - 145 mmol/L   Potassium 4.4 3.5 - 5.1 mmol/L   Chloride 106 98 - 111 mmol/L   CO2 21 (L) 22 - 32 mmol/L   Glucose, Bld 191 (H) 70 - 99 mg/dL   BUN 21 8 - 23 mg/dL   Creatinine, Ser 1.63 (H) 0.61 - 1.24 mg/dL   Calcium 8.6 (L) 8.9 - 10.3 mg/dL   Total Protein 6.4 (L) 6.5 - 8.1 g/dL   Albumin 3.6 3.5 - 5.0 g/dL   AST 31 15 - 41 U/L   ALT 25 0 - 44 U/L   Alkaline Phosphatase 119 38 - 126 U/L   Total Bilirubin 0.7 0.3 - 1.2 mg/dL   GFR calc non Af Amer 39 (L) >60 mL/min   GFR calc Af Amer 46 (L) >60 mL/min   Anion gap 15 5 - 15    Comment: Performed at [facility], [address] [place], [place] [address]  CBC WITH DIFFERENTIAL     Status: Abnormal   Collection Time: 04/18/19  3:45 PM  Result Value Ref Range   WBC 13.5 (H) 4.0 - 10.5 K/uL   RBC 3.75 (L) 4.22 - 5.81 MIL/uL   Hemoglobin 11.1 (L) 13.0 - 17.0 g/dL  HCT 36.2 (L) 39.0 - 52.0 %   MCV 96.5 80.0 - 100.0 fL   MCH 29.6 26.0 - 34.0 pg   MCHC 30.7 30.0 - 36.0 g/dL   RDW 14.3 11.5 - 15.5 %   Platelets 288 150 - 400 K/uL   nRBC 0.0 0.0 - 0.2 %   Neutrophils Relative % 65 %   Neutro Abs 8.7 (H) 1.7 - 7.7 K/uL   Lymphocytes Relative 29 %   Lymphs Abs 3.9 0.7 - 4.0 K/uL   Monocytes Relative 5 %   Monocytes Absolute 0.7 0.1 - 1.0 K/uL   Eosinophils Relative 1 %   Eosinophils Absolute 0.1 0.0 - 0.5 K/uL   Basophils Relative 0 %   Basophils Absolute 0.1 0.0 - 0.1 K/uL   Immature Granulocytes 0 %   Abs Immature Granulocytes 0.04 0.00 - 0.07 K/uL    Comment: Performed at [facility], [address], [place], [place] [address]  Protime-INR     Status: None   Collection Time: 04/18/19  3:45 PM  Result Value Ref Range   Prothrombin Time 12.9 11.4 - 15.2 seconds   INR 1.0 0.8 - 1.2    Comment: (NOTE) INR goal varies based on  device and disease states. Performed at [facility], [address] [place], [place] [address]   Type and screen [facility]     Status: None   Collection Time: 04/18/19  3:45 PM  Result Value Ref Range   ABO/RH(D)      B POS Performed at [facility], [address], [place], [place] [address]    Antibody Screen      POS Performed at [facility], [address], [place], [place] [address]    Sample Expiration      04/21/2019,2359 Performed at [facility], [address] [place], [place] [address]    Antibody Identification      NO CLINICALLY SIGNIFICANT ANTIBODY IDENTIFIED Performed at [facility], [place] [address], [place], [place] [address]   SARS Coronavirus 2 (CEPHEID - Performed in [place] hospital lab), Hosp Order     Status: None   Collection Time: 04/18/19  5:08 PM  Result Value Ref Range   SARS Coronavirus 2 NEGATIVE NEGATIVE    Comment: (NOTE) If result is NEGATIVE SARS-CoV-2 target nucleic acids are NOT DETECTED. The SARS-CoV-2 RNA is generally detectable in upper and lower  respiratory specimens during the acute phase of infection. The lowest  concentration of SARS-CoV-2 viral copies this assay can detect is 250  copies / mL. A negative result does not preclude SARS-CoV-2 infection  and should not be used as the sole basis for treatment or other  Stephen Rogers management decisions.  A negative result may occur with  improper specimen collection / handling, submission of specimen other  than nasopharyngeal swab, presence of viral mutation(s) within the  areas targeted by this assay, and inadequate number of viral copies  (<250 copies / mL). A negative result must be combined with clinical  observations, Stephen Rogers history, and epidemiological information. If result is POSITIVE SARS-CoV-2 target nucleic acids are DETECTED. The SARS-CoV-2 RNA is generally detectable in upper and lower  respiratory specimens dur ing the acute phase of  infection.  Positive  results are indicative of active infection with SARS-CoV-2.  Clinical  correlation with Stephen Rogers history and other diagnostic information is  necessary to determine Stephen Rogers infection status.  Positive results do  not rule out bacterial infection or co-infection with other  viruses. If result is PRESUMPTIVE POSTIVE SARS-CoV-2 nucleic acids MAY BE PRESENT.   A presumptive positive result was obtained on the submitted specimen  and confirmed on repeat testing.  While 2019 novel coronavirus  (SARS-CoV-2) nucleic acids may be present in the submitted sample  additional confirmatory testing may be necessary for epidemiological  and / or clinical management purposes  to differentiate between  SARS-CoV-2 and other Sarbecovirus currently known to infect humans.  If clinically indicated additional testing with an alternate test  methodology [phone]) is advised. The SARS-CoV-2 RNA is generally  detectable in upper and lower respiratory sp ecimens during the acute  phase of infection. The expected result is Negative. Fact Sheet for Patients:  StrictlyIdeas.no Fact Sheet for Healthcare Providers: BankingDealers.co.za This test is not yet approved or cleared by the [place] FDA and has been authorized for detection and/or diagnosis of SARS-CoV-2 by FDA under an Emergency Use Authorization (EUA).  This EUA will remain in effect (meaning this test can be used) for the duration of the COVID-19 declaration under Section 564(b)(1) of the Act, 21 U.S.C. section 360bbb-3(b)(1), unless the authorization is terminated or revoked sooner. Performed at [facility], [address], [place], [place] [address]   Glucose, capillary     Status: Abnormal   Collection Time: 04/18/19  7:59 PM  Result Value Ref Range   Glucose-Capillary 155 (H) 70 - 99 mg/dL   Comment 1 Notify RN    Comment 2 Document in Chart   CBC     Status: Abnormal    Collection Time: 04/18/19 11:38 PM  Result Value Ref Range   WBC 10.6 (H) 4.0 - 10.5 K/uL   RBC 3.21 (L) 4.22 - 5.81 MIL/uL   Hemoglobin 9.5 (L) 13.0 - 17.0 g/dL   HCT 30.9 (L) 39.0 - 52.0 %   MCV 96.3 80.0 - 100.0 fL   MCH 29.6 26.0 - 34.0 pg   MCHC 30.7 30.0 - 36.0 g/dL   RDW 14.3 11.5 - 15.5 %   Platelets 234 150 - 400 K/uL   nRBC 0.0 0.0 - 0.2 %    Comment: Performed at [facility], [address] [place], [place] [address]  Glucose, capillary     Status: Abnormal   Collection Time: 04/19/19 12:00 AM  Result Value Ref Range   Glucose-Capillary 113 (H) 70 - 99 mg/dL  Glucose, capillary     Status: Abnormal   Collection Time: 04/19/19  4:19 AM  Result Value Ref Range   Glucose-Capillary 133 (H) 70 - 99 mg/dL  Comprehensive metabolic panel     Status: Abnormal   Collection Time: 04/19/19  4:24 AM  Result Value Ref Range   Sodium 143 135 - 145 mmol/L   Potassium 4.0 3.5 - 5.1 mmol/L   Chloride 108 98 - 111 mmol/L   CO2 27 22 - 32 mmol/L   Glucose, Bld 143 (H) 70 - 99 mg/dL   BUN 20 8 - 23 mg/dL   Creatinine, Ser 1.18 0.61 - 1.24 mg/dL   Calcium 8.1 (L) 8.9 - 10.3 mg/dL   Total Protein 5.7 (L) 6.5 - 8.1 g/dL   Albumin 3.1 (L) 3.5 - 5.0 g/dL   AST 22 15 - 41 U/L   ALT 25 0 - 44 U/L   Alkaline Phosphatase 94 38 - 126 U/L   Total Bilirubin 0.7 0.3 - 1.2 mg/dL   GFR calc non Af Amer 58 (L) >60 mL/min   GFR calc Af Amer >60 >60  mL/min   Anion gap 8 5 - 15    Comment: Performed at [facility], [address], [place], [place] [address]  CBC     Status: Abnormal   Collection Time: 04/19/19  4:24 AM  Result Value Ref Range   WBC 8.2 4.0 - 10.5 K/uL   RBC 3.08 (L) 4.22 - 5.81 MIL/uL   Hemoglobin 9.1 (L) 13.0 - 17.0 g/dL   HCT 28.9 (L) 39.0 - 52.0 %   MCV 93.8 80.0 - 100.0 fL   MCH 29.5 26.0 - 34.0 pg   MCHC 31.5 30.0 - 36.0 g/dL   RDW 14.5 11.5 - 15.5 %   Platelets 225 150 - 400 K/uL   nRBC 0.0 0.0 - 0.2 %    Comment: Performed at [facility], [address]  [place], [place] [address]  Glucose, capillary     Status: Abnormal   Collection Time: 04/19/19  7:24 AM  Result Value Ref Range   Glucose-Capillary 103 (H) 70 - 99 mg/dL  Glucose, capillary     Status: Abnormal   Collection Time: 04/19/19 11:12 AM  Result Value Ref Range   Glucose-Capillary 156 (H) 70 - 99 mg/dL  Glucose, capillary     Status: None   Collection Time: 04/19/19  3:39 PM  Result Value Ref Range   Glucose-Capillary 80 70 - 99 mg/dL  Glucose, capillary     Status: Abnormal   Collection Time: 04/19/19  4:55 PM  Result Value Ref Range   Glucose-Capillary 104 (H) 70 - 99 mg/dL  Glucose, capillary     Status: Abnormal   Collection Time: 04/19/19  9:40 PM  Result Value Ref Range   Glucose-Capillary 180 (H) 70 - 99 mg/dL  Glucose, capillary     Status: Abnormal   Collection Time: 04/20/19  1:00 AM  Result Value Ref Range   Glucose-Capillary 130 (H) 70 - 99 mg/dL  Glucose, capillary     Status: Abnormal   Collection Time: 04/20/19  4:13 AM  Result Value Ref Range   Glucose-Capillary 118 (H) 70 - 99 mg/dL  Glucose, capillary     Status: Abnormal   Collection Time: 04/20/19  7:24 AM  Result Value Ref Range   Glucose-Capillary 124 (H) 70 - 99 mg/dL  CBC     Status: Abnormal   Collection Time: 04/20/19  8:55 AM  Result Value Ref Range   WBC 6.4 4.0 - 10.5 K/uL   RBC 3.06 (L) 4.22 - 5.81 MIL/uL   Hemoglobin 9.0 (L) 13.0 - 17.0 g/dL   HCT 28.6 (L) 39.0 - 52.0 %   MCV 93.5 80.0 - 100.0 fL   MCH 29.4 26.0 - 34.0 pg   MCHC 31.5 30.0 - 36.0 g/dL   RDW 14.3 11.5 - 15.5 %   Platelets 219 150 - 400 K/uL   nRBC 0.0 0.0 - 0.2 %    Comment: Performed at [facility], [address] [place], [place] [address]  Comprehensive metabolic panel     Status: Abnormal   Collection Time: 04/20/19  8:55 AM  Result Value Ref Range   Sodium 138 135 - 145 mmol/L   Potassium 4.2 3.5 - 5.1 mmol/L   Chloride 106 98 - 111 mmol/L   CO2 23 22 - 32 mmol/L   Glucose, Bld 147 (H) 70 - 99 mg/dL   BUN  12 8 - 23 mg/dL   Creatinine, Ser 1.15 0.61 - 1.24 mg/dL   Calcium 8.2 (L) 8.9 - 10.3  mg/dL   Total Protein 6.1 (L) 6.5 - 8.1 g/dL   Albumin 3.3 (L) 3.5 - 5.0 g/dL   AST 51 (H) 15 - 41 U/L   ALT 71 (H) 0 - 44 U/L   Alkaline Phosphatase 109 38 - 126 U/L   Total Bilirubin 0.5 0.3 - 1.2 mg/dL   GFR calc non Af Amer >60 >60 mL/min   GFR calc Af Amer >60 >60 mL/min   Anion gap 9 5 - 15    Comment: Performed at [facility], [address] [place], [place] [address]  Glucose, capillary     Status: Abnormal   Collection Time: 04/20/19 10:59 AM  Result Value Ref Range   Glucose-Capillary 153 (H) 70 - 99 mg/dL    Ct Abdomen Pelvis W Contrast  Result Date: 04/19/2019 CLINICAL DATA:  Rectal bleeding and anemia. EXAM: CT ABDOMEN AND PELVIS WITH CONTRAST TECHNIQUE: Multidetector CT imaging of the abdomen and pelvis was performed using the standard protocol following bolus administration of intravenous contrast. CONTRAST:  129mL OMNIPAQUE IOHEXOL 300 MG/ML  SOLN COMPARISON:  CT dated 10/22/2016. FINDINGS: Lower chest: There is bilateral gynecomastia. The heart is enlarged. Coronary artery calcifications are noted. Hepatobiliary: The liver is unremarkable. The gallbladder is diffusely abnormal with multiple pockets of gas and hyperdense material. The gallbladder wall does not appear significantly thickened. The common bile duct is mildly dilated. There appears to be a fistula tract from the gallbladder to the hepatic flexure of the colon. This can be best visualized on the coronal and sagittal series. An old cholecystostomy tube drain track is noted coursing towards the anterior abdominal wall. Pancreas: Unremarkable. No pancreatic ductal dilatation or surrounding inflammatory changes. Spleen: Normal in size without focal abnormality. Adrenals/Urinary Tract: The left kidney is unremarkable aside from a small cyst arising from the lower pole. There is a slightly complex 2.3 cm cystic structure in the upper pole  the right kidney. Both adrenal glands are unremarkable. There is no hydronephrosis. The urinary bladder is significantly distended with layering hyperdense material within the dependent portion, likely related to prior contrast injection. Stomach/Bowel: There are scattered colonic diverticula without CT evidence of diverticulitis. There are air-fluid levels in the colon which are likely related to prior colonoscopy prep. The appendix is unremarkable. The stomach is grossly unremarkable. There is no evidence of a small-bowel obstruction. Vascular/Lymphatic: Aortic atherosclerosis. No enlarged abdominal or pelvic lymph nodes. Reproductive: The prostate gland is significantly enlarged. Other: No abdominal wall hernia or abnormality. No abdominopelvic ascites. Musculoskeletal: Multilevel degenerative changes are noted of the thoracolumbar spine. IMPRESSION: 1. Findings highly suspicious for a fistula track between the gallbladder lumen and hepatic flexure of the colon. There are multiple pockets of gas and debris within the gallbladder lumen. There is no definite CT evidence of acute cholecystitis. 2. Moderate dilatation of the common bile duct. Correlation with laboratory studies is recommended to help exclude an obstructing process. 3. Significantly distended urinary bladder. 4. No specific abnormality identified to explain the Stephen Rogers's painless hematochezia. 5. Cardiomegaly.  Bilateral gynecomastia is noted. Electronically Signed   By: Constance Holster M.D.   On: 04/19/2019 21:41    ROS:  Pertinent items are noted in HPI.  Blood pressure 132/79, pulse 75, temperature 98 F (36.7 C), temperature source Oral, resp. rate 16, height 5\' 6"  (1.676 m), weight 95.7 kg, SpO2 99 %. Physical Exam: Pleasant well-developed well-nourished black male no acute distress Head is normocephalic, atraumatic Eyes without scleral icterus. Lungs clear to auscultation with good  breath sounds bilaterally Heart examination  reveals a regular rate and rhythm without S3, S4, murmurs Abdomen is soft, nontender, nondistended.  No hepatosplenomegaly, masses, hernias identified  CT scan images personally reviewed  Assessment/Plan: Impression: History of cholecystitis with cholelithiasis, now with cholecolo fistula.  This is a very rare finding and I suspect it has to be related to his previous cholecystostomy tube and inflamed gallbladder.  As he has had several episodes of cholecystitis in the past and he does have a dilated common bile duct as well as slightly abnormal LFTs, I would recommend open cholecystectomy and repair of the hepatic flexure colon.  Stephen Rogers understands this and agrees.  We will proceed with open cholecystectomy with repair of colotomy on 04/23/2019.  The risks and benefits of the procedure including bleeding, infection, cardiopulmonary difficulties, and the possibility of anastomotic leak or breakdown were fully explained to the Stephen Rogers, who gave informed consent.  Would leave the Stephen Rogers on a full liquid diet today.  Aviva Signs 04/20/2019, 12:53 PM

## 2019-04-20 NOTE — Progress Notes (Signed)
PROGRESS NOTE    [name]  [id number]:[id number]  DOB: [birth year]  DOA: 04/18/2019 PCP: [name], MD   Brief Admission Hx: 78 y.o. male admitted with painless rectal bleeding.   MDM/Assessment & Plan:   1. Painless rectal bleeding - Pt had colonscopy done earlier today with findings of abnormal area of colon at hepatic flexure.  He had a CT abdomen with findings of a fistula involving the gallbladder and colon.  Surgery was consulted.   GI team following.  2. Cholecolo fistula - Pt was seen by Dr. [name] who is formulating plan for operative management on 04/23/19.  Pt will remain on full liquid diet for now.  3. Acute blood loss anemia -  Follow Hg.   4. Leukocytosis - resolved.  5. Type 2 DM - continue sliding scale coverage and CBG testing.  6. Hyperlipidemia - atorvastatin 40 mg daily ordered.   DVT prophylaxis: SCDs Code Status: Full  Family Communication: daughter updated telephone  Disposition Plan:    Consultants:  GI  Procedures: Impression:               - One 4 mm polyp at the recto-sigmoid colon,                            removed with a cold snare. Resected and retrieved.                            Colonic lipoma.                           Markedly abnormal area of colon at the hepatic                            flexure of uncertain etiology. (Query fistula,                            atypical presentation for infiltrating tumor or                            possibly related to previously placed                            cholecystostomy tube). This area is suspicious for                            site of bleeding.  Antimicrobials:     Subjective: Pt without any complaints this morning.     Objective: Vitals:   04/19/19 2137 04/20/19 0500 04/20/19 0555 04/20/19 1300  BP: (!) 156/76  132/79 (!) 164/85  Pulse: 88  75 88  Resp: 16  16 19   Temp: 97.8 F (36.6 C)  98 F (36.7 C) 98.2 F (36.8 C)  TempSrc: Oral  Oral Oral  SpO2: 100%   99% 100%  Weight:  95.7 kg    Height:        Intake/Output Summary (Last 24 hours) at 04/20/2019 1521 Last data filed at 04/20/2019 0900 Gross per 24 hour  Intake 1380 ml  Output --  Net 1380 ml   Filed Weights   04/18/19 1518 04/19/19 0611 04/20/19 0500  Weight:  91.2 kg 95.6 kg 95.7 kg     REVIEW OF SYSTEMS  As per history otherwise all reviewed and reported negative  Exam:  General exam: awake, alert, NAD, cooperative.   Respiratory system: Clear. No increased work of breathing. Cardiovascular system: S1 & S2 heard. No JVD, murmurs, gallops, clicks or pedal edema. Gastrointestinal system: Abdomen is nondistended, soft and nontender. Normal bowel sounds heard. Central nervous system: Alert and oriented. No focal neurological deficits. Extremities: no CCE.  Data Reviewed: Basic Metabolic Panel: Recent Labs  Lab 04/18/19 1545 04/19/19 0424 04/20/19 0855  NA 142 143 138  K 4.4 4.0 4.2  CL 106 108 106  CO2 21* 27 23  GLUCOSE 191* 143* 147*  BUN 21 20 12   CREATININE 1.63* 1.18 1.15  CALCIUM 8.6* 8.1* 8.2*   Liver Function Tests: Recent Labs  Lab 04/18/19 1545 04/19/19 0424 04/20/19 0855  AST 31 22 51*  ALT 25 25 71*  ALKPHOS 119 94 109  BILITOT 0.7 0.7 0.5  PROT 6.4* 5.7* 6.1*  ALBUMIN 3.6 3.1* 3.3*   No results for input(s): LIPASE, AMYLASE in the last 168 hours. No results for input(s): AMMONIA in the last 168 hours. CBC: Recent Labs  Lab 04/18/19 1545 04/18/19 2338 04/19/19 0424 04/20/19 0855  WBC 13.5* 10.6* 8.2 6.4  NEUTROABS 8.7*  --   --   --   HGB 11.1* 9.5* 9.1* 9.0*  HCT 36.2* 30.9* 28.9* 28.6*  MCV 96.5 96.3 93.8 93.5  PLT 288 234 225 219   Cardiac Enzymes: No results for input(s): CKTOTAL, CKMB, CKMBINDEX, TROPONINI in the last 168 hours. CBG (last 3)  Recent Labs    04/20/19 0413 04/20/19 0724 04/20/19 1059  GLUCAP 118* 124* 153*   Recent Results (from the past 240 hour(s))  SARS Coronavirus 2 (CEPHEID - Performed in [place] hospital lab), Hosp Order     Status: None   Collection Time: 04/18/19  5:08 PM  Result Value Ref Range Status   SARS Coronavirus 2 NEGATIVE NEGATIVE Final    Comment: (NOTE) If result is NEGATIVE SARS-CoV-2 target nucleic acids are NOT DETECTED. The SARS-CoV-2 RNA is generally detectable in upper and lower  respiratory specimens during the acute phase of infection. The lowest  concentration of SARS-CoV-2 viral copies this assay can detect is 250  copies / mL. A negative result does not preclude SARS-CoV-2 infection  and should not be used as the sole basis for treatment or other  patient management decisions.  A negative result may occur with  improper specimen collection / handling, submission of specimen other  than nasopharyngeal swab, presence of viral mutation(s) within the  areas targeted by this assay, and inadequate number of viral copies  (<250 copies / mL). A negative result must be combined with clinical  observations, patient history, and epidemiological information. If result is POSITIVE SARS-CoV-2 target nucleic acids are DETECTED. The SARS-CoV-2 RNA is generally detectable in upper and lower  respiratory specimens dur ing the acute phase of infection.  Positive  results are indicative of active infection with SARS-CoV-2.  Clinical  correlation with patient history and other diagnostic information is  necessary to determine patient infection status.  Positive results do  not rule out bacterial infection or co-infection with other viruses. If result is PRESUMPTIVE POSTIVE SARS-CoV-2 nucleic acids MAY BE PRESENT.   A presumptive positive result was obtained on the submitted specimen  and confirmed on repeat testing.  While 2019 novel coronavirus  (SARS-CoV-2) nucleic acids  may be present in the submitted sample  additional confirmatory testing may be necessary for epidemiological  and / or clinical management purposes  to differentiate between  SARS-CoV-2 and  other Sarbecovirus currently known to infect humans.  If clinically indicated additional testing with an alternate test  methodology [phone]) is advised. The SARS-CoV-2 RNA is generally  detectable in upper and lower respiratory sp ecimens during the acute  phase of infection. The expected result is Negative. Fact Sheet for Patients:  StrictlyIdeas.no Fact Sheet for Healthcare Providers: BankingDealers.co.za This test is not yet approved or cleared by the [place] FDA and has been authorized for detection and/or diagnosis of SARS-CoV-2 by FDA under an Emergency Use Authorization (EUA).  This EUA will remain in effect (meaning this test can be used) for the duration of the COVID-19 declaration under Section 564(b)(1) of the Act, 21 U.S.C. section 360bbb-3(b)(1), unless the authorization is terminated or revoked sooner. Performed at [facility], [address] [place], [place] [address]      Studies: Ct Abdomen Pelvis W Contrast  Result Date: 04/19/2019 CLINICAL DATA:  Rectal bleeding and anemia. EXAM: CT ABDOMEN AND PELVIS WITH CONTRAST TECHNIQUE: Multidetector CT imaging of the abdomen and pelvis was performed using the standard protocol following bolus administration of intravenous contrast. CONTRAST:  177mL OMNIPAQUE IOHEXOL 300 MG/ML  SOLN COMPARISON:  CT dated 10/22/2016. FINDINGS: Lower chest: There is bilateral gynecomastia. The heart is enlarged. Coronary artery calcifications are noted. Hepatobiliary: The liver is unremarkable. The gallbladder is diffusely abnormal with multiple pockets of gas and hyperdense material. The gallbladder wall does not appear significantly thickened. The common bile duct is mildly dilated. There appears to be a fistula tract from the gallbladder to the hepatic flexure of the colon. This can be best visualized on the coronal and sagittal series. An old cholecystostomy tube drain track is noted coursing  towards the anterior abdominal wall. Pancreas: Unremarkable. No pancreatic ductal dilatation or surrounding inflammatory changes. Spleen: Normal in size without focal abnormality. Adrenals/Urinary Tract: The left kidney is unremarkable aside from a small cyst arising from the lower pole. There is a slightly complex 2.3 cm cystic structure in the upper pole the right kidney. Both adrenal glands are unremarkable. There is no hydronephrosis. The urinary bladder is significantly distended with layering hyperdense material within the dependent portion, likely related to prior contrast injection. Stomach/Bowel: There are scattered colonic diverticula without CT evidence of diverticulitis. There are air-fluid levels in the colon which are likely related to prior colonoscopy prep. The appendix is unremarkable. The stomach is grossly unremarkable. There is no evidence of a small-bowel obstruction. Vascular/Lymphatic: Aortic atherosclerosis. No enlarged abdominal or pelvic lymph nodes. Reproductive: The prostate gland is significantly enlarged. Other: No abdominal wall hernia or abnormality. No abdominopelvic ascites. Musculoskeletal: Multilevel degenerative changes are noted of the thoracolumbar spine. IMPRESSION: 1. Findings highly suspicious for a fistula track between the gallbladder lumen and hepatic flexure of the colon. There are multiple pockets of gas and debris within the gallbladder lumen. There is no definite CT evidence of acute cholecystitis. 2. Moderate dilatation of the common bile duct. Correlation with laboratory studies is recommended to help exclude an obstructing process. 3. Significantly distended urinary bladder. 4. No specific abnormality identified to explain the patient's painless hematochezia. 5. Cardiomegaly.  Bilateral gynecomastia is noted. Electronically Signed   By: Constance Holster M.D.   On: 04/19/2019 21:41     Scheduled Meds:  atorvastatin  40 mg Oral q1800   hydrALAZINE  100 mg  Oral BID   insulin aspart  0-9 Units Subcutaneous TID WC   lisinopril  40 mg Oral Daily   verapamil  240 mg Oral QHS   Continuous Infusions:  Principal Problem:   Rectal bleeding Active Problems:   Diabetes mellitus (HCC)   CKD (chronic kidney disease), stage III (HCC)   Anemia   Time spent:   Irwin Brakeman, MD [facility] 04/20/2019, 3:21 PM    LOS: 0 days  How to contact the [facility] Attending or Consulting provider [place] or covering provider during after hours [place], for this patient?  1. Check the care team in [facility] and look for a) attending/consulting TRH provider listed and b) the [facility] team listed 2. Log into [email or web address] and use [place]'s universal password to access. If you do not have the password, please contact the hospital operator. 3. Locate the [facility] provider you are looking for under [facility] and page to a number that you can be directly reached. 4. If you still have difficulty reaching the provider, please page the [facility] (Director on Call) for the Hospitalists listed on amion for assistance.

## 2019-04-21 DIAGNOSIS — Z8719 Personal history of other diseases of the digestive system: Secondary | ICD-10-CM

## 2019-04-21 LAB — GLUCOSE, CAPILLARY
Glucose-Capillary: 153 mg/dL — ABNORMAL HIGH (ref 70–99)
Glucose-Capillary: 151 mg/dL — ABNORMAL HIGH (ref 70–99)
Glucose-Capillary: 109 mg/dL — ABNORMAL HIGH (ref 70–99)
Glucose-Capillary: 155 mg/dL — ABNORMAL HIGH (ref 70–99)

## 2019-04-21 LAB — COMPREHENSIVE METABOLIC PANEL
ALT: 55 U/L — ABNORMAL HIGH (ref 0–44)
AST: 36 U/L (ref 15–41)
Albumin: 3.3 g/dL — ABNORMAL LOW (ref 3.5–5.0)
Alkaline Phosphatase: 110 U/L (ref 38–126)
Anion gap: 8 (ref 5–15)
BUN: 9 mg/dL (ref 8–23)
CO2: 26 mmol/L (ref 22–32)
Calcium: 8.6 mg/dL — ABNORMAL LOW (ref 8.9–10.3)
Chloride: 106 mmol/L (ref 98–111)
Creatinine, Ser: 1.13 mg/dL (ref 0.61–1.24)
GFR calc Af Amer: >60 mL/min (ref >60)
GFR calc non Af Amer: >60 mL/min (ref >60)
Glucose, Bld: 142 mg/dL — ABNORMAL HIGH (ref 70–99)
Potassium: 4 mmol/L (ref 3.5–5.1)
Sodium: 140 mmol/L (ref 135–145)
Total Bilirubin: 0.6 mg/dL (ref 0.3–1.2)
Total Protein: 6.2 g/dL — ABNORMAL LOW (ref 6.5–8.1)

## 2019-04-21 LAB — CBC
HCT: 27.7 % — ABNORMAL LOW (ref 39.0–52.0)
Hemoglobin: 8.7 g/dL — ABNORMAL LOW (ref 13.0–17.0)
MCH: 29.4 pg (ref 26.0–34.0)
MCHC: 31.4 g/dL (ref 30.0–36.0)
MCV: 93.6 fL (ref 80.0–100.0)
Platelets: 229 10*3/uL (ref 150–400)
RBC: 2.96 MIL/uL — ABNORMAL LOW (ref 4.22–5.81)
RDW: 14.4 % (ref 11.5–15.5)
WBC: 5.5 10*3/uL (ref 4.0–10.5)
nRBC: 0 % (ref 0.0–0.2)

## 2019-04-21 NOTE — Progress Notes (Signed)
PROGRESS NOTE    Stephen Rogers.  SFK:[id number]  DOB: 02-May-1941  DOA: 04/18/2019 PCP: [name], MD   Brief Admission Hx: 78 y.o. male admitted with painless rectal bleeding.   MDM/Assessment & Plan:   1. Painless rectal bleeding - Pt had colonscopy done 5/30 with findings of abnormal area of colon at hepatic flexure.  He had a CT abdomen with findings of a fistula involving the gallbladder and colon.  Surgery was consulted.     2. Cholecolo fistula - Pt was seen by Dr. [name] who is planning for operative management on 04/23/19.  Pt will remain on full liquid diet for now.  3. Acute blood loss anemia -  Follow Hg.   4. Leukocytosis - resolved.  5. Type 2 DM - continue sliding scale coverage and CBG testing.  6. Hyperlipidemia - atorvastatin 40 mg daily ordered.   DVT prophylaxis: SCDs Code Status: Full  Family Communication: daughter updated telephone  Disposition Plan: inpatient   Consultants:  GI  Procedures: Impression:               - One 4 mm polyp at the recto-sigmoid colon,                            removed with a cold snare. Resected and retrieved.                            Colonic lipoma.                           Markedly abnormal area of colon at the hepatic                            flexure of uncertain etiology. (Query fistula,                            atypical presentation for infiltrating tumor or                            possibly related to previously placed                            cholecystostomy tube). This area is suspicious for                            site of bleeding.  Antimicrobials:     Subjective: Pt without any complaints this morning.  He has minimal stool output. No abdominal pain.      Objective: Vitals:   04/20/19 1948 04/20/19 2214 04/21/19 0531 04/21/19 0846  BP:  (!) 159/93 128/73   Pulse: 83 82 79   Resp: 20 12 18    Temp:  98.6 F (37 C) 97.7 F (36.5 C)   TempSrc:  Oral Oral   SpO2: 96% 97% 98% 97%   Weight:   92.9 kg   Height:        Intake/Output Summary (Last 24 hours) at 04/21/2019 1127 Last data filed at 04/21/2019 0900 Gross per 24 hour  Intake 2070 ml  Output --  Net 2070 ml   Filed Weights   04/19/19 0611 04/20/19 0500 04/21/19 0531  Weight: 95.6 kg 95.7 kg 92.9 kg     REVIEW OF SYSTEMS  As per history otherwise all reviewed and reported negative  Exam:  General exam: awake, alert, NAD, cooperative.   Respiratory system: Clear. No increased work of breathing. Cardiovascular system: S1 & S2 heard. No JVD, murmurs, gallops, clicks or pedal edema. Gastrointestinal system: Abdomen is nondistended, soft and nontender. Normal bowel sounds heard. Central nervous system: Alert and oriented. No focal neurological deficits. Extremities: no CCE.  Data Reviewed: Basic Metabolic Panel: Recent Labs  Lab 04/18/19 1545 04/19/19 0424 04/20/19 0855 04/21/19 0617  NA 142 143 138 140  K 4.4 4.0 4.2 4.0  CL 106 108 106 106  CO2 21* 27 23 26   GLUCOSE 191* 143* 147* 142*  BUN 21 20 12 9   CREATININE 1.63* 1.18 1.15 1.13  CALCIUM 8.6* 8.1* 8.2* 8.6*   Liver Function Tests: Recent Labs  Lab 04/18/19 1545 04/19/19 0424 04/20/19 0855 04/21/19 0617  AST 31 22 51* 36  ALT 25 25 71* 55*  ALKPHOS 119 94 109 110  BILITOT 0.7 0.7 0.5 0.6  PROT 6.4* 5.7* 6.1* 6.2*  ALBUMIN 3.6 3.1* 3.3* 3.3*   No results for input(s): LIPASE, AMYLASE in the last 168 hours. No results for input(s): AMMONIA in the last 168 hours. CBC: Recent Labs  Lab 04/18/19 1545 04/18/19 2338 04/19/19 0424 04/20/19 0855 04/21/19 0617  WBC 13.5* 10.6* 8.2 6.4 5.5  NEUTROABS 8.7*  --   --   --   --   HGB 11.1* 9.5* 9.1* 9.0* 8.7*  HCT 36.2* 30.9* 28.9* 28.6* 27.7*  MCV 96.5 96.3 93.8 93.5 93.6  PLT 288 234 225 219 229   Cardiac Enzymes: No results for input(s): CKTOTAL, CKMB, CKMBINDEX, TROPONINI in the last 168 hours. CBG (last 3)  Recent Labs    04/20/19 2200 04/21/19 0725 04/21/19 1111   GLUCAP 108* 153* 151*   Recent Results (from the past 240 hour(s))  SARS Coronavirus 2 (CEPHEID - Performed in [place] hospital lab), Hosp Order     Status: None   Collection Time: 04/18/19  5:08 PM  Result Value Ref Range Status   SARS Coronavirus 2 NEGATIVE NEGATIVE Final    Comment: (NOTE) If result is NEGATIVE SARS-CoV-2 target nucleic acids are NOT DETECTED. The SARS-CoV-2 RNA is generally detectable in upper and lower  respiratory specimens during the acute phase of infection. The lowest  concentration of SARS-CoV-2 viral copies this assay can detect is 250  copies / mL. A negative result does not preclude SARS-CoV-2 infection  and should not be used as the sole basis for treatment or other  patient management decisions.  A negative result may occur with  improper specimen collection / handling, submission of specimen other  than nasopharyngeal swab, presence of viral mutation(s) within the  areas targeted by this assay, and inadequate number of viral copies  (<250 copies / mL). A negative result must be combined with clinical  observations, patient history, and epidemiological information. If result is POSITIVE SARS-CoV-2 target nucleic acids are DETECTED. The SARS-CoV-2 RNA is generally detectable in upper and lower  respiratory specimens dur ing the acute phase of infection.  Positive  results are indicative of active infection with SARS-CoV-2.  Clinical  correlation with patient history and other diagnostic information is  necessary to determine patient infection status.  Positive results do  not rule out bacterial infection or co-infection with other viruses. If result is PRESUMPTIVE POSTIVE SARS-CoV-2 nucleic acids MAY  BE PRESENT.   A presumptive positive result was obtained on the submitted specimen  and confirmed on repeat testing.  While 2019 novel coronavirus  (SARS-CoV-2) nucleic acids may be present in the submitted sample  additional confirmatory testing  may be necessary for epidemiological  and / or clinical management purposes  to differentiate between  SARS-CoV-2 and other Sarbecovirus currently known to infect humans.  If clinically indicated additional testing with an alternate test  methodology [phone]) is advised. The SARS-CoV-2 RNA is generally  detectable in upper and lower respiratory sp ecimens during the acute  phase of infection. The expected result is Negative. Fact Sheet for Patients:  StrictlyIdeas.no Fact Sheet for Healthcare Providers: BankingDealers.co.za This test is not yet approved or cleared by the [place] FDA and has been authorized for detection and/or diagnosis of SARS-CoV-2 by FDA under an Emergency Use Authorization (EUA).  This EUA will remain in effect (meaning this test can be used) for the duration of the COVID-19 declaration under Section 564(b)(1) of the Act, 21 U.S.C. section 360bbb-3(b)(1), unless the authorization is terminated or revoked sooner. Performed at [facility], [address] [place],  [address]      Studies: Ct Abdomen Pelvis W Contrast  Result Date: 04/19/2019 CLINICAL DATA:  Rectal bleeding and anemia. EXAM: CT ABDOMEN AND PELVIS WITH CONTRAST TECHNIQUE: Multidetector CT imaging of the abdomen and pelvis was performed using the standard protocol following bolus administration of intravenous contrast. CONTRAST:  142mL OMNIPAQUE IOHEXOL 300 MG/ML  SOLN COMPARISON:  CT dated 10/22/2016. FINDINGS: Lower chest: There is bilateral gynecomastia. The heart is enlarged. Coronary artery calcifications are noted. Hepatobiliary: The liver is unremarkable. The gallbladder is diffusely abnormal with multiple pockets of gas and hyperdense material. The gallbladder wall does not appear significantly thickened. The common bile duct is mildly dilated. There appears to be a fistula tract from the gallbladder to the hepatic flexure of the colon. This  can be best visualized on the coronal and sagittal series. An old cholecystostomy tube drain track is noted coursing towards the anterior abdominal wall. Pancreas: Unremarkable. No pancreatic ductal dilatation or surrounding inflammatory changes. Spleen: Normal in size without focal abnormality. Adrenals/Urinary Tract: The left kidney is unremarkable aside from a small cyst arising from the lower pole. There is a slightly complex 2.3 cm cystic structure in the upper pole the right kidney. Both adrenal glands are unremarkable. There is no hydronephrosis. The urinary bladder is significantly distended with layering hyperdense material within the dependent portion, likely related to prior contrast injection. Stomach/Bowel: There are scattered colonic diverticula without CT evidence of diverticulitis. There are air-fluid levels in the colon which are likely related to prior colonoscopy prep. The appendix is unremarkable. The stomach is grossly unremarkable. There is no evidence of a small-bowel obstruction. Vascular/Lymphatic: Aortic atherosclerosis. No enlarged abdominal or pelvic lymph nodes. Reproductive: The prostate gland is significantly enlarged. Other: No abdominal wall hernia or abnormality. No abdominopelvic ascites. Musculoskeletal: Multilevel degenerative changes are noted of the thoracolumbar spine. IMPRESSION: 1. Findings highly suspicious for a fistula track between the gallbladder lumen and hepatic flexure of the colon. There are multiple pockets of gas and debris within the gallbladder lumen. There is no definite CT evidence of acute cholecystitis. 2. Moderate dilatation of the common bile duct. Correlation with laboratory studies is recommended to help exclude an obstructing process. 3. Significantly distended urinary bladder. 4. No specific abnormality identified to explain the patient's painless hematochezia. 5. Cardiomegaly.  Bilateral gynecomastia is noted. Electronically Signed  By: Constance Holster M.D.   On: 04/19/2019 21:41     Scheduled Meds:  atorvastatin  40 mg Oral q1800   hydrALAZINE  100 mg Oral BID   insulin aspart  0-9 Units Subcutaneous TID WC   lisinopril  40 mg Oral Daily   verapamil  240 mg Oral QHS   Continuous Infusions:  Principal Problem:   Rectal bleeding Active Problems:   Diabetes mellitus ([place])   CKD (chronic kidney disease), stage III (HCC)   Anemia   History of cholecystitis   Time spent:   Stephen Brakeman, MD [facility] 04/21/2019, 11:27 AM    LOS: 1 day  How to contact the [facility] Attending or Consulting provider [place] or covering provider during after hours [place], for this patient?  1. Check the care team in [facility] and look for a) attending/consulting TRH provider listed and b) the [facility] team listed 2. Log into [email or web address] and use [place]'s universal password to access. If you do not have the password, please contact the hospital operator. 3. Locate the [facility] provider you are looking for under [facility] and page to a number that you can be directly reached. 4. If you still have difficulty reaching the provider, please page the [facility] (Director on Call) for the Hospitalists listed on amion for assistance.

## 2019-04-21 NOTE — Progress Notes (Signed)
2 Days Post-Op  Subjective: No complaints  Objective: Vital signs in last 24 hours: Temp:  [97.7 F (36.5 C)-98.6 F (37 C)] 97.7 F (36.5 C) (05/30 0531) Pulse Rate:  [79-88] 79 (05/30 0531) Resp:  [12-20] 18 (05/30 0531) BP: (128-164)/(73-93) 128/73 (05/30 0531) SpO2:  [96 %-100 %] 98 % (05/30 0531) Weight:  [92.9 kg] 92.9 kg (05/30 0531) Last BM Date: 04/18/19  Intake/Output from previous day: 05/29 0701 - 05/30 0700 In: 1830 [P.O.:1830] Out: -  Intake/Output this shift: No intake/output data recorded.  General appearance: alert, cooperative and no distress GI: soft, non-tender; bowel sounds normal; no masses,  no organomegaly  Lab Results:  Recent Labs    04/20/19 0855 04/21/19 0617  WBC 6.4 5.5  HGB 9.0* 8.7*  HCT 28.6* 27.7*  PLT 219 229   BMET Recent Labs    04/20/19 0855 04/21/19 0617  NA 138 140  K 4.2 4.0  CL 106 106  CO2 23 26  GLUCOSE 147* 142*  BUN 12 9  CREATININE 1.15 1.13  CALCIUM 8.2* 8.6*   PT/INR Recent Labs    04/18/19 1545  LABPROT 12.9  INR 1.0    Studies/Results: Ct Abdomen Pelvis W Contrast  Result Date: 04/19/2019 CLINICAL DATA:  Rectal bleeding and anemia. EXAM: CT ABDOMEN AND PELVIS WITH CONTRAST TECHNIQUE: Multidetector CT imaging of the abdomen and pelvis was performed using the standard protocol following bolus administration of intravenous contrast. CONTRAST:  132mL OMNIPAQUE IOHEXOL 300 MG/ML  SOLN COMPARISON:  CT dated 10/22/2016. FINDINGS: Lower chest: There is bilateral gynecomastia. The heart is enlarged. Coronary artery calcifications are noted. Hepatobiliary: The liver is unremarkable. The gallbladder is diffusely abnormal with multiple pockets of gas and hyperdense material. The gallbladder wall does not appear significantly thickened. The common bile duct is mildly dilated. There appears to be a fistula tract from the gallbladder to the hepatic flexure of the colon. This can be best visualized on the coronal and  sagittal series. An old cholecystostomy tube drain track is noted coursing towards the anterior abdominal wall. Pancreas: Unremarkable. No pancreatic ductal dilatation or surrounding inflammatory changes. Spleen: Normal in size without focal abnormality. Adrenals/Urinary Tract: The left kidney is unremarkable aside from a small cyst arising from the lower pole. There is a slightly complex 2.3 cm cystic structure in the upper pole the right kidney. Both adrenal glands are unremarkable. There is no hydronephrosis. The urinary bladder is significantly distended with layering hyperdense material within the dependent portion, likely related to prior contrast injection. Stomach/Bowel: There are scattered colonic diverticula without CT evidence of diverticulitis. There are air-fluid levels in the colon which are likely related to prior colonoscopy prep. The appendix is unremarkable. The stomach is grossly unremarkable. There is no evidence of a small-bowel obstruction. Vascular/Lymphatic: Aortic atherosclerosis. No enlarged abdominal or pelvic lymph nodes. Reproductive: The prostate gland is significantly enlarged. Other: No abdominal wall hernia or abnormality. No abdominopelvic ascites. Musculoskeletal: Multilevel degenerative changes are noted of the thoracolumbar spine. IMPRESSION: 1. Findings highly suspicious for a fistula track between the gallbladder lumen and hepatic flexure of the colon. There are multiple pockets of gas and debris within the gallbladder lumen. There is no definite CT evidence of acute cholecystitis. 2. Moderate dilatation of the common bile duct. Correlation with laboratory studies is recommended to help exclude an obstructing process. 3. Significantly distended urinary bladder. 4. No specific abnormality identified to explain the patient's painless hematochezia. 5. Cardiomegaly.  Bilateral gynecomastia is noted. Electronically Signed  By: Constance Holster M.D.   On: 04/19/2019 21:41     Anti-infectives: Anti-infectives (From admission, onward)   None      Assessment/Plan: s/p Procedure(s): COLONOSCOPY WITH PROPOFOL POLYPECTOMY Imp:  Doing well, on full liquid diet. Plan:  Clear liquid diet.  For surgery on 04/23/19.  LOS: 1 day    Aviva Signs 04/21/2019

## 2019-04-22 DIAGNOSIS — Z8719 Personal history of other diseases of the digestive system: Secondary | ICD-10-CM

## 2019-04-22 LAB — CBC
HCT: 29.6 % — ABNORMAL LOW (ref 39.0–52.0)
Hemoglobin: 9.4 g/dL — ABNORMAL LOW (ref 13.0–17.0)
MCH: 29.7 pg (ref 26.0–34.0)
MCHC: 31.8 g/dL (ref 30.0–36.0)
MCV: 93.7 fL (ref 80.0–100.0)
Platelets: 293 10*3/uL (ref 150–400)
RBC: 3.16 MIL/uL — ABNORMAL LOW (ref 4.22–5.81)
RDW: 14.3 % (ref 11.5–15.5)
WBC: 7.2 10*3/uL (ref 4.0–10.5)
nRBC: 0.3 % — ABNORMAL HIGH (ref 0.0–0.2)

## 2019-04-22 LAB — BPAM RBC
Blood Product Expiration Date: 202006102359
Unit Type and Rh: 7300

## 2019-04-22 LAB — SURGICAL PCR SCREEN
MRSA, PCR: NEGATIVE
Staphylococcus aureus: NEGATIVE

## 2019-04-22 LAB — GLUCOSE, CAPILLARY
Glucose-Capillary: 131 mg/dL — ABNORMAL HIGH (ref 70–99)
Glucose-Capillary: 142 mg/dL — ABNORMAL HIGH (ref 70–99)
Glucose-Capillary: 118 mg/dL — ABNORMAL HIGH (ref 70–99)
Glucose-Capillary: 142 mg/dL — ABNORMAL HIGH (ref 70–99)

## 2019-04-22 LAB — PREPARE RBC (CROSSMATCH)

## 2019-04-22 MED ORDER — CHLORHEXIDINE GLUCONATE CLOTH 2 % EX PADS
6.0000 | MEDICATED_PAD | Freq: Once | CUTANEOUS | Status: AC
  Administered 2019-04-23: 05:00:00 6 via TOPICAL

## 2019-04-22 MED ORDER — CHLORHEXIDINE GLUCONATE CLOTH 2 % EX PADS: 6.0000 | MEDICATED_PAD | Freq: Once | CUTANEOUS | Status: AC

## 2019-04-22 MED ORDER — CHLORHEXIDINE GLUCONATE CLOTH 2 % EX PADS
6.0000 | MEDICATED_PAD | Freq: Once | CUTANEOUS | Status: AC
  Administered 2019-04-22: 6 via TOPICAL

## 2019-04-22 MED ORDER — ENOXAPARIN SODIUM 40 MG/0.4ML ~~LOC~~ SOLN
40.0000 mg | Freq: Once | SUBCUTANEOUS | Status: AC
  Administered 2019-04-23: 08:00:00 40 mg via SUBCUTANEOUS
  Filled 2019-04-22: qty 0.4

## 2019-04-22 MED ORDER — SODIUM CHLORIDE 0.9 % IV SOLN
1.0000 g | INTRAVENOUS | Status: AC
  Filled 2019-04-22: qty 1

## 2019-04-22 NOTE — Progress Notes (Signed)
PROGRESS NOTE  Stephen ELLINWOOD Sr.  ZOX:[id number]  DOB: [birth year]  DOA: 04/18/2019 PCP: [name], MD   Brief Admission Hx: 78 y.o. male admitted with painless rectal bleeding.   MDM/Assessment & Plan:   1. Painless rectal bleeding - Pt had colonscopy done 5/30 with findings of abnormal area of colon at hepatic flexure.  He had a CT abdomen with findings of a fistula involving the gallbladder and colon.  Surgery was consulted.     2. Cholecolo fistula - Pt was seen by Dr. [name] who is planning for operative management on 04/23/19.  Pt now on clear liquid diet.   3. Acute blood loss anemia -  Follow Hg.   4. Leukocytosis - resolved.  5. Type 2 DM - continue sliding scale coverage and CBG testing.  6. Hyperlipidemia - atorvastatin 40 mg daily ordered.   DVT prophylaxis: SCDs Code Status: Full  Family Communication: daughter updated telephone  Disposition Plan: inpatient  Consultants:  GI  Procedures: Impression:     - One 4 mm polyp at the recto-sigmoid colon,                            removed with a cold snare. Resected and retrieved.                            Colonic lipoma.                           Markedly abnormal area of colon at the hepatic                            flexure of uncertain etiology. (Query fistula,                            atypical presentation for infiltrating tumor or                            possibly related to previously placed                            cholecystostomy tube). This area is suspicious for                            site of bleeding.  Antimicrobials:     Subjective: Pt without any complaints this morning.  He has minimal stool output. No abdominal pain.      Objective: Vitals:   04/21/19 0846 04/21/19 1249 04/21/19 2101 04/22/19 0632  BP:  (!) 149/79 (!) 161/96 137/78  Pulse:  85 85 80  Resp:  17 20 18   Temp:  98 F (36.7 C) 98.2 F (36.8 C) 98.1 F (36.7 C)  TempSrc:   Oral Oral  SpO2: 97% 99% 100% 97%   Weight:    90.7 kg  Height:        Intake/Output Summary (Last 24 hours) at 04/22/2019 1200 Last data filed at 04/22/2019 1100 Gross per 24 hour  Intake 1920 ml  Output 2175 ml  Net -255 ml   Filed Weights   04/20/19 0500 04/21/19 0531 04/22/19 8119  Weight: 95.7 kg 92.9 kg 90.7 kg  REVIEW OF SYSTEMS  As per history otherwise all reviewed and reported negative  Exam:  General exam: awake, alert, NAD, cooperative.   Respiratory system: Clear. No increased work of breathing. Cardiovascular system: S1 & S2 heard. No JVD, murmurs, gallops, clicks or pedal edema. Gastrointestinal system: Abdomen is nondistended, soft and nontender. Normal bowel sounds heard. Central nervous system: Alert and oriented. No focal neurological deficits. Extremities: no CCE.  Data Reviewed: Basic Metabolic Panel: Recent Labs  Lab 04/18/19 1545 04/19/19 0424 04/20/19 0855 04/21/19 0617  NA 142 143 138 140  K 4.4 4.0 4.2 4.0  CL 106 108 106 106  CO2 21* 27 23 26   GLUCOSE 191* 143* 147* 142*  BUN 21 20 12 9   CREATININE 1.63* 1.18 1.15 1.13  CALCIUM 8.6* 8.1* 8.2* 8.6*   Liver Function Tests: Recent Labs  Lab 04/18/19 1545 04/19/19 0424 04/20/19 0855 04/21/19 0617  AST 31 22 51* 36  ALT 25 25 71* 55*  ALKPHOS 119 94 109 110  BILITOT 0.7 0.7 0.5 0.6  PROT 6.4* 5.7* 6.1* 6.2*  ALBUMIN 3.6 3.1* 3.3* 3.3*   No results for input(s): LIPASE, AMYLASE in the last 168 hours. No results for input(s): AMMONIA in the last 168 hours. CBC: Recent Labs  Lab 04/18/19 1545 04/18/19 2338 04/19/19 0424 04/20/19 0855 04/21/19 0617 04/22/19 0458  WBC 13.5* 10.6* 8.2 6.4 5.5 7.2  NEUTROABS 8.7*  --   --   --   --   --   HGB 11.1* 9.5* 9.1* 9.0* 8.7* 9.4*  HCT 36.2* 30.9* 28.9* 28.6* 27.7* 29.6*  MCV 96.5 96.3 93.8 93.5 93.6 93.7  PLT 288 234 225 219 229 293   Cardiac Enzymes: No results for input(s): CKTOTAL, CKMB, CKMBINDEX, TROPONINI in the last 168 hours. CBG (last 3)  Recent Labs     04/21/19 2103 04/22/19 0715 04/22/19 1137  GLUCAP 155* 131* 142*   Recent Results (from the past 240 hour(s))  SARS Coronavirus 2 (CEPHEID - Performed in [facility] hospital lab), Hosp Order     Status: None   Collection Time: 04/18/19  5:08 PM  Result Value Ref Range Status   SARS Coronavirus 2 NEGATIVE NEGATIVE Final    Comment: (NOTE) If result is NEGATIVE SARS-CoV-2 target nucleic acids are NOT DETECTED. The SARS-CoV-2 RNA is generally detectable in upper and lower  respiratory specimens during the acute phase of infection. The lowest  concentration of SARS-CoV-2 viral copies this assay can detect is 250  copies / mL. A negative result does not preclude SARS-CoV-2 infection  and should not be used as the sole basis for treatment or other  patient management decisions.  A negative result may occur with  improper specimen collection / handling, submission of specimen other  than nasopharyngeal swab, presence of viral mutation(s) within the  areas targeted by this assay, and inadequate number of viral copies  (<250 copies / mL). A negative result must be combined with clinical  observations, patient history, and epidemiological information. If result is POSITIVE SARS-CoV-2 target nucleic acids are DETECTED. The SARS-CoV-2 RNA is generally detectable in upper and lower  respiratory specimens dur ing the acute phase of infection.  Positive  results are indicative of active infection with SARS-CoV-2.  Clinical  correlation with patient history and other diagnostic information is  necessary to determine patient infection status.  Positive results do  not rule out bacterial infection or co-infection with other viruses. If result is PRESUMPTIVE POSTIVE SARS-CoV-2 nucleic acids MAY BE  PRESENT.   A presumptive positive result was obtained on the submitted specimen  and confirmed on repeat testing.  While 2019 novel coronavirus  (SARS-CoV-2) nucleic acids may be present in the  submitted sample  additional confirmatory testing may be necessary for epidemiological  and / or clinical management purposes  to differentiate between  SARS-CoV-2 and other Sarbecovirus currently known to infect humans.  If clinically indicated additional testing with an alternate test  methodology [phone]) is advised. The SARS-CoV-2 RNA is generally  detectable in upper and lower respiratory sp ecimens during the acute  phase of infection. The expected result is Negative. Fact Sheet for Patients:  StrictlyIdeas.no Fact Sheet for Healthcare Providers: BankingDealers.co.za This test is not yet approved or cleared by the [place] FDA and has been authorized for detection and/or diagnosis of SARS-CoV-2 by FDA under an Emergency Use Authorization (EUA).  This EUA will remain in effect (meaning this test can be used) for the duration of the COVID-19 declaration under Section 564(b)(1) of the Act, 21 U.S.C. section 360bbb-3(b)(1), unless the authorization is terminated or revoked sooner. Performed at [facility], [address], [place], [place] [address]   Surgical pcr screen     Status: None   Collection Time: 04/22/19  7:33 AM  Result Value Ref Range Status   MRSA, PCR NEGATIVE NEGATIVE Final   Staphylococcus aureus NEGATIVE NEGATIVE Final    Comment: (NOTE) The Xpert SA Assay (FDA approved for NASAL specimens in patients 101 years of age and older), is one component of a comprehensive surveillance program. It is not intended to diagnose infection nor to guide or monitor treatment. Performed at [facility], [address] [place], [place] [address]     Studies: No results found.  Scheduled Meds: . atorvastatin  40 mg Oral q1800  . Chlorhexidine Gluconate Cloth  6 each Topical Once   And  . Chlorhexidine Gluconate Cloth  6 each Topical Once  . Chlorhexidine Gluconate Cloth  6 each Topical Once   And  . Chlorhexidine  Gluconate Cloth  6 each Topical Once  . enoxaparin (LOVENOX) injection  40 mg Subcutaneous Once  . hydrALAZINE  100 mg Oral BID  . insulin aspart  0-9 Units Subcutaneous TID WC  . lisinopril  40 mg Oral Daily  . verapamil  240 mg Oral QHS   Continuous Infusions: . ertapenem     Principal Problem:   Rectal bleeding Active Problems:   Diabetes mellitus (HCC)   CKD (chronic kidney disease), stage III (HCC)   Anemia   History of cholecystitis  Time spent:   Irwin Brakeman, MD [facility] 04/22/2019, 12:00 PM    LOS: 2 days  How to contact the [facility] Attending or Consulting provider [place] or covering provider during after hours [place], for this patient?  1. Check the care team in [facility] and look for a) attending/consulting TR[facility] provider listed and b) the [facility] team listed 2. Log into [email or web address] and use [place]'s universal password to access. If you do not have the password, please contact the hospital operator. 3. Locate the [facility] provider you are looking for under [facility] and page to a number that you can be directly reached. 4. If you still have difficulty reaching the provider, please page the [facility] (Director on Call) for the Hospitalists listed on amion for assistance.

## 2019-04-22 NOTE — Progress Notes (Signed)
3 Days Post-Op  Subjective: No complaints.  Objective: Vital signs in last 24 hours: Temp:  [98 F (36.7 C)-98.2 F (36.8 C)] 98.1 F (36.7 C) (05/31 7614) Pulse Rate:  [80-85] 80 (05/31 0632) Resp:  [17-20] 18 (05/31 7092) BP: (137-161)/(78-96) 137/78 (05/31 9574) SpO2:  [97 %-100 %] 97 % (05/31 7340) Weight:  [90.7 kg] 90.7 kg (05/31 3709) Last BM Date: 04/18/19  Intake/Output from previous day: 05/30 0701 - 05/31 0700 In: 1920 [P.O.:1920] Out: 1400 [Urine:1400] Intake/Output this shift: Total I/O In: 720 [P.O.:720] Out: 325 [Urine:325]  General appearance: alert, cooperative and no distress GI: soft, non-tender; bowel sounds normal; no masses,  no organomegaly  Lab Results:  Recent Labs    04/21/19 0617 04/22/19 0458  WBC 5.5 7.2  HGB 8.7* 9.4*  HCT 27.7* 29.6*  PLT 229 293   BMET Recent Labs    04/20/19 0855 04/21/19 0617  NA 138 140  K 4.2 4.0  CL 106 106  CO2 23 26  GLUCOSE 147* 142*  BUN 12 9  CREATININE 1.15 1.13  CALCIUM 8.2* 8.6*   PT/INR No results for input(s): LABPROT, INR in the last 72 hours.  Studies/Results: No results found.  Anti-infectives: Anti-infectives (From admission, onward)   None      Assessment/Plan: s/p Procedure(s): COLONOSCOPY WITH PROPOFOL POLYPECTOMY Imp: Cholecystitis with perforation in the colon Plan: Patient is scheduled for cholecystectomy with repair of colon tomorrow.  The risks and benefits of the procedure including bleeding, infection, hepatobiliary injury, and the possibility of extended colectomy were fully explained to the patient, who gave informed consent.  LOS: 2 days    Aviva Signs 04/22/2019

## 2019-04-22 NOTE — H&P (View-Only) (Signed)
3 Days Post-Op  Subjective: No complaints.  Objective: Vital signs in last 24 hours: Temp:  [98 F (36.7 C)-98.2 F (36.8 C)] 98.1 F (36.7 C) (05/31 8250) Pulse Rate:  [80-85] 80 (05/31 0632) Resp:  [17-20] 18 (05/31 5397) BP: (137-161)/(78-96) 137/78 (05/31 6734) SpO2:  [97 %-100 %] 97 % (05/31 1937) Weight:  [90.7 kg] 90.7 kg (05/31 9024) Last BM Date: 04/18/19  Intake/Output from previous day: 05/30 0701 - 05/31 0700 In: 1920 [P.O.:1920] Out: 1400 [Urine:1400] Intake/Output this shift: Total I/O In: 720 [P.O.:720] Out: 325 [Urine:325]  General appearance: alert, cooperative and no distress GI: soft, non-tender; bowel sounds normal; no masses,  no organomegaly  Lab Results:  Recent Labs    04/21/19 0617 04/22/19 0458  WBC 5.5 7.2  HGB 8.7* 9.4*  HCT 27.7* 29.6*  PLT 229 293   BMET Recent Labs    04/20/19 0855 04/21/19 0617  NA 138 140  K 4.2 4.0  CL 106 106  CO2 23 26  GLUCOSE 147* 142*  BUN 12 9  CREATININE 1.15 1.13  CALCIUM 8.2* 8.6*   PT/INR No results for input(s): LABPROT, INR in the last 72 hours.  Studies/Results: No results found.  Anti-infectives: Anti-infectives (From admission, onward)   None      Assessment/Plan: s/p Procedure(s): COLONOSCOPY WITH PROPOFOL POLYPECTOMY Imp: Cholecystitis with perforation in the colon Plan: Patient is scheduled for cholecystectomy with repair of colon tomorrow.  The risks and benefits of the procedure including bleeding, infection, hepatobiliary injury, and the possibility of extended colectomy were fully explained to the patient, who gave informed consent.  LOS: 2 days    Aviva Signs 04/22/2019

## 2019-04-22 NOTE — Progress Notes (Signed)
Consent signed and placed in the front of patient's chart. Pt is aware of NPO status after midnight.

## 2019-04-23 ENCOUNTER — Encounter (HOSPITAL_COMMUNITY)
Admission: EM | Disposition: A | Discharge status: Skilled Nursing Facility | Payer: Self-pay | Source: Home / Self Care | Attending: Family Medicine

## 2019-04-23 ENCOUNTER — Encounter (HOSPITAL_COMMUNITY): Payer: Self-pay | Admitting: *Deleted

## 2019-04-23 ENCOUNTER — Inpatient Hospital Stay (HOSPITAL_COMMUNITY): Payer: Medicare HMO | Admitting: Anesthesiology

## 2019-04-23 DIAGNOSIS — K8010 Calculus of gallbladder with chronic cholecystitis without obstruction: Secondary | ICD-10-CM

## 2019-04-23 DIAGNOSIS — K823 Fistula of gallbladder: Secondary | ICD-10-CM

## 2019-04-23 HISTORY — PX: CHOLECYSTECTOMY: SHX55

## 2019-04-23 HISTORY — PX: PARTIAL COLECTOMY: SHX5273

## 2019-04-23 LAB — CBC
HCT: 32.7 % — ABNORMAL LOW (ref 39.0–52.0)
Hemoglobin: 10.1 g/dL — ABNORMAL LOW (ref 13.0–17.0)
MCH: 29.6 pg (ref 26.0–34.0)
MCHC: 30.9 g/dL (ref 30.0–36.0)
MCV: 95.9 fL (ref 80.0–100.0)
Platelets: 287 10*3/uL (ref 150–400)
RBC: 3.41 MIL/uL — ABNORMAL LOW (ref 4.22–5.81)
RDW: 14.3 % (ref 11.5–15.5)
WBC: 7.5 10*3/uL (ref 4.0–10.5)
nRBC: 0 % (ref 0.0–0.2)

## 2019-04-23 LAB — CREATININE, SERUM
Creatinine, Ser: 1.63 mg/dL — ABNORMAL HIGH (ref 0.61–1.24)
GFR calc Af Amer: 46 mL/min — ABNORMAL LOW (ref >60)
GFR calc non Af Amer: 39 mL/min — ABNORMAL LOW (ref >60)

## 2019-04-23 LAB — GLUCOSE, CAPILLARY
Glucose-Capillary: 120 mg/dL — ABNORMAL HIGH (ref 70–99)
Glucose-Capillary: 171 mg/dL — ABNORMAL HIGH (ref 70–99)
Glucose-Capillary: 160 mg/dL — ABNORMAL HIGH (ref 70–99)
Glucose-Capillary: 219 mg/dL — ABNORMAL HIGH (ref 70–99)

## 2019-04-23 SURGERY — CHOLECYSTECTOMY
Anesthesia: General

## 2019-04-23 MED ORDER — SODIUM CHLORIDE 0.9 % IV SOLN
1.0000 g | INTRAVENOUS | Status: DC
  Administered 2019-04-24 – 2019-04-26 (×3): 1000 mg via INTRAVENOUS
  Filled 2019-04-23 (×8): qty 1

## 2019-04-23 MED ORDER — PROMETHAZINE HCL 25 MG/ML IJ SOLN: 6.2500 mg | INTRAMUSCULAR | Status: DC | PRN

## 2019-04-23 MED ORDER — DIPHENHYDRAMINE HCL 12.5 MG/5ML PO ELIX: 12.5000 mg | ORAL_SOLUTION | Freq: Four times a day (QID) | ORAL | Status: DC | PRN

## 2019-04-23 MED ORDER — PROPOFOL 10 MG/ML IV BOLUS
INTRAVENOUS | Status: DC | PRN
  Administered 2019-04-23: 170 mg via INTRAVENOUS

## 2019-04-23 MED ORDER — SUCCINYLCHOLINE CHLORIDE 200 MG/10ML IV SOSY
PREFILLED_SYRINGE | INTRAVENOUS | Status: AC
  Filled 2019-04-23: qty 10

## 2019-04-23 MED ORDER — LORAZEPAM 2 MG/ML IJ SOLN: 0.5000 mg | INTRAMUSCULAR | Status: DC | PRN

## 2019-04-23 MED ORDER — HEMOSTATIC AGENTS (NO CHARGE) OPTIME
TOPICAL | Status: DC | PRN
  Administered 2019-04-23: 1 via TOPICAL

## 2019-04-23 MED ORDER — POVIDONE-IODINE 10 % EX OINT
TOPICAL_OINTMENT | CUTANEOUS | Status: AC
  Filled 2019-04-23: qty 1

## 2019-04-23 MED ORDER — BUPIVACAINE LIPOSOME 1.3 % IJ SUSP
INTRAMUSCULAR | Status: AC
  Filled 2019-04-23: qty 20

## 2019-04-23 MED ORDER — ROCURONIUM BROMIDE 10 MG/ML (PF) SYRINGE
PREFILLED_SYRINGE | INTRAVENOUS | Status: AC
  Filled 2019-04-23: qty 10

## 2019-04-23 MED ORDER — 0.9 % SODIUM CHLORIDE (POUR BTL) OPTIME
TOPICAL | Status: DC | PRN
  Administered 2019-04-23 (×5): 1000 mL

## 2019-04-23 MED ORDER — LACTATED RINGERS IV SOLN: INTRAVENOUS | Status: DC

## 2019-04-23 MED ORDER — MIDAZOLAM HCL 5 MG/5ML IJ SOLN
INTRAMUSCULAR | Status: DC | PRN
  Administered 2019-04-23 (×2): 2 mg via INTRAVENOUS

## 2019-04-23 MED ORDER — SODIUM CHLORIDE 0.9 % IV SOLN
INTRAVENOUS | Status: DC
  Administered 2019-04-23 – 2019-04-25 (×6): via INTRAVENOUS

## 2019-04-23 MED ORDER — MIDAZOLAM HCL 2 MG/2ML IJ SOLN
INTRAMUSCULAR | Status: AC
  Filled 2019-04-23: qty 2

## 2019-04-23 MED ORDER — KETOROLAC TROMETHAMINE 30 MG/ML IJ SOLN
15.0000 mg | Freq: Once | INTRAMUSCULAR | Status: AC
  Administered 2019-04-23: 13:00:00 15 mg via INTRAVENOUS
  Filled 2019-04-23: qty 1

## 2019-04-23 MED ORDER — PHENYLEPHRINE 40 MCG/ML (10ML) SYRINGE FOR IV PUSH (FOR BLOOD PRESSURE SUPPORT)
PREFILLED_SYRINGE | INTRAVENOUS | Status: AC
  Filled 2019-04-23: qty 10

## 2019-04-23 MED ORDER — ACETAMINOPHEN 325 MG PO TABS
650.0000 mg | ORAL_TABLET | Freq: Four times a day (QID) | ORAL | Status: DC | PRN
  Administered 2019-04-28 – 2019-04-30 (×4): 650 mg via ORAL
  Filled 2019-04-23 (×4): qty 2

## 2019-04-23 MED ORDER — HYDROMORPHONE HCL 1 MG/ML IJ SOLN: 0.2500 mg | INTRAMUSCULAR | Status: DC | PRN

## 2019-04-23 MED ORDER — FENTANYL CITRATE (PF) 250 MCG/5ML IJ SOLN
INTRAMUSCULAR | Status: AC
  Filled 2019-04-23: qty 5

## 2019-04-23 MED ORDER — SODIUM CHLORIDE 0.9 % IV BOLUS
1000.0000 mL | Freq: Once | INTRAVENOUS | Status: AC
  Administered 2019-04-23: 1000 mL via INTRAVENOUS

## 2019-04-23 MED ORDER — SUGAMMADEX SODIUM 500 MG/5ML IV SOLN
INTRAVENOUS | Status: DC | PRN
  Administered 2019-04-23: 200 mg via INTRAVENOUS

## 2019-04-23 MED ORDER — SIMETHICONE 80 MG PO CHEW
40.0000 mg | CHEWABLE_TABLET | Freq: Four times a day (QID) | ORAL | Status: DC | PRN
  Administered 2019-04-27: 18:00:00 40 mg via ORAL
  Filled 2019-04-23: qty 1

## 2019-04-23 MED ORDER — FENTANYL CITRATE (PF) 100 MCG/2ML IJ SOLN
50.0000 ug | INTRAMUSCULAR | Status: DC | PRN
  Administered 2019-04-24 – 2019-04-27 (×4): 50 ug via INTRAVENOUS
  Filled 2019-04-23 (×4): qty 2

## 2019-04-23 MED ORDER — POVIDONE-IODINE 10 % OINT PACKET
TOPICAL_OINTMENT | CUTANEOUS | Status: DC | PRN
  Administered 2019-04-23: 1 via TOPICAL

## 2019-04-23 MED ORDER — ACETAMINOPHEN 650 MG RE SUPP
650.0000 mg | Freq: Four times a day (QID) | RECTAL | Status: DC | PRN
  Administered 2019-04-29: 14:00:00 650 mg via RECTAL
  Filled 2019-04-23: qty 1

## 2019-04-23 MED ORDER — EPHEDRINE 5 MG/ML INJ
INTRAVENOUS | Status: AC
  Filled 2019-04-23: qty 10

## 2019-04-23 MED ORDER — HYDROCODONE-ACETAMINOPHEN 5-325 MG PO TABS
1.0000 | ORAL_TABLET | ORAL | Status: DC | PRN
  Administered 2019-04-25 – 2019-05-01 (×4): 2 via ORAL
  Filled 2019-04-23 (×4): qty 2

## 2019-04-23 MED ORDER — ONDANSETRON HCL 4 MG/2ML IJ SOLN: 4.0000 mg | Freq: Four times a day (QID) | INTRAMUSCULAR | Status: DC | PRN

## 2019-04-23 MED ORDER — SUCCINYLCHOLINE CHLORIDE 20 MG/ML IJ SOLN
INTRAMUSCULAR | Status: DC | PRN
  Administered 2019-04-23: 120 mg via INTRAVENOUS

## 2019-04-23 MED ORDER — DIPHENHYDRAMINE HCL 50 MG/ML IJ SOLN: 12.5000 mg | Freq: Four times a day (QID) | INTRAMUSCULAR | Status: DC | PRN

## 2019-04-23 MED ORDER — HYDROCODONE-ACETAMINOPHEN 7.5-325 MG PO TABS: 1.0000 | ORAL_TABLET | Freq: Once | ORAL | Status: DC | PRN

## 2019-04-23 MED ORDER — SODIUM CHLORIDE 0.9 % IV SOLN
1.0000 g | Freq: Once | INTRAVENOUS | Status: AC
  Administered 2019-04-23: 10:00:00 1 g via INTRAVENOUS

## 2019-04-23 MED ORDER — ENOXAPARIN SODIUM 40 MG/0.4ML ~~LOC~~ SOLN
40.0000 mg | SUBCUTANEOUS | Status: DC
  Administered 2019-04-24 – 2019-04-28 (×5): 40 mg via SUBCUTANEOUS
  Filled 2019-04-23 (×5): qty 0.4

## 2019-04-23 MED ORDER — LACTATED RINGERS IV SOLN
INTRAVENOUS | Status: DC
  Administered 2019-04-23 (×2): via INTRAVENOUS

## 2019-04-23 MED ORDER — PANTOPRAZOLE SODIUM 40 MG PO TBEC
40.0000 mg | DELAYED_RELEASE_TABLET | Freq: Every day | ORAL | Status: DC
  Administered 2019-04-24 – 2019-04-29 (×6): 40 mg via ORAL
  Filled 2019-04-23 (×5): qty 1

## 2019-04-23 MED ORDER — SODIUM CHLORIDE 0.9 % IV SOLN
INTRAVENOUS | Status: AC
  Filled 2019-04-23: qty 1

## 2019-04-23 MED ORDER — DEXAMETHASONE SODIUM PHOSPHATE 10 MG/ML IJ SOLN
INTRAMUSCULAR | Status: AC
  Filled 2019-04-23: qty 1

## 2019-04-23 MED ORDER — BUPIVACAINE LIPOSOME 1.3 % IJ SUSP
INTRAMUSCULAR | Status: DC | PRN
  Administered 2019-04-23: 20 mL

## 2019-04-23 MED ORDER — FENTANYL CITRATE (PF) 250 MCG/5ML IJ SOLN
INTRAMUSCULAR | Status: DC | PRN
  Administered 2019-04-23: 100 ug via INTRAVENOUS

## 2019-04-23 MED ORDER — DEXAMETHASONE SODIUM PHOSPHATE 10 MG/ML IJ SOLN
INTRAMUSCULAR | Status: DC | PRN
  Administered 2019-04-23: 10 mg via INTRAVENOUS

## 2019-04-23 MED ORDER — INSULIN ASPART 100 UNIT/ML ~~LOC~~ SOLN
0.0000 [IU] | Freq: Three times a day (TID) | SUBCUTANEOUS | Status: DC
  Administered 2019-04-23: 17:00:00 3 [IU] via SUBCUTANEOUS
  Administered 2019-04-24: 08:00:00 5 [IU] via SUBCUTANEOUS
  Administered 2019-04-24 (×2): 3 [IU] via SUBCUTANEOUS
  Administered 2019-04-25 (×3): 2 [IU] via SUBCUTANEOUS
  Administered 2019-04-26: 13:00:00 3 [IU] via SUBCUTANEOUS
  Administered 2019-04-26 – 2019-04-27 (×3): 2 [IU] via SUBCUTANEOUS
  Administered 2019-04-27: 3 [IU] via SUBCUTANEOUS
  Administered 2019-04-27: 09:00:00 2 [IU] via SUBCUTANEOUS
  Administered 2019-04-28 (×2): 3 [IU] via SUBCUTANEOUS
  Administered 2019-04-28: 09:00:00 2 [IU] via SUBCUTANEOUS
  Administered 2019-04-29: 09:00:00 3 [IU] via SUBCUTANEOUS
  Administered 2019-04-29 (×2): 5 [IU] via SUBCUTANEOUS
  Administered 2019-04-30 (×2): 2 [IU] via SUBCUTANEOUS
  Administered 2019-04-30: 17:00:00 8 [IU] via SUBCUTANEOUS
  Administered 2019-05-01 (×3): 2 [IU] via SUBCUTANEOUS
  Administered 2019-05-02 (×2): 3 [IU] via SUBCUTANEOUS
  Administered 2019-05-02: 08:00:00 2 [IU] via SUBCUTANEOUS
  Administered 2019-05-03: 12:00:00 5 [IU] via SUBCUTANEOUS
  Administered 2019-05-03: 08:00:00 3 [IU] via SUBCUTANEOUS

## 2019-04-23 MED ORDER — MEPERIDINE HCL 50 MG/ML IJ SOLN: 6.2500 mg | INTRAMUSCULAR | Status: DC | PRN

## 2019-04-23 MED ORDER — ONDANSETRON 4 MG PO TBDP: 4.0000 mg | ORAL_TABLET | Freq: Four times a day (QID) | ORAL | Status: DC | PRN

## 2019-04-23 MED ORDER — EPHEDRINE SULFATE 50 MG/ML IJ SOLN
INTRAMUSCULAR | Status: DC | PRN
  Administered 2019-04-23 (×3): 10 mg via INTRAVENOUS
  Administered 2019-04-23 (×2): 5 mg via INTRAVENOUS

## 2019-04-23 MED ORDER — ROCURONIUM BROMIDE 100 MG/10ML IV SOLN
INTRAVENOUS | Status: DC | PRN
  Administered 2019-04-23: 10 mg via INTRAVENOUS
  Administered 2019-04-23: 40 mg via INTRAVENOUS

## 2019-04-23 MED ORDER — PHENYLEPHRINE HCL (PRESSORS) 10 MG/ML IV SOLN
INTRAVENOUS | Status: DC | PRN
  Administered 2019-04-23 (×2): 80 ug via INTRAVENOUS
  Administered 2019-04-23: 40 ug via INTRAVENOUS
  Administered 2019-04-23 (×2): 80 ug via INTRAVENOUS
  Administered 2019-04-23: 40 ug via INTRAVENOUS
  Administered 2019-04-23: 80 ug via INTRAVENOUS
  Administered 2019-04-23 (×2): 40 ug via INTRAVENOUS

## 2019-04-23 SURGICAL SUPPLY — 58 items
APPLIER CLIP 11 MED OPEN (CLIP) ×2
APPLIER CLIP 13 LRG OPEN (CLIP) ×2
CELLS DAT CNTRL 66122 CELL SVR (MISCELLANEOUS) ×1 IMPLANT
CHLORAPREP W/TINT 26 (MISCELLANEOUS) ×2 IMPLANT
CLIP APPLIE 11 MED OPEN (CLIP) IMPLANT
CLIP APPLIE 13 LRG OPEN (CLIP) IMPLANT
CLOTH BEACON ORANGE TIMEOUT ST (SAFETY) ×2 IMPLANT
COVER LIGHT HANDLE STERIS (MISCELLANEOUS) ×8 IMPLANT
COVER WAND RF STERILE (DRAPES) ×2 IMPLANT
DRAPE HALF SHEET 40X57 (DRAPES) ×1 IMPLANT
DRAPE WARM FLUID 44X44 (DRAPE) ×2 IMPLANT
DRSG OPSITE POSTOP 4X8 (GAUZE/BANDAGES/DRESSINGS) ×2 IMPLANT
ELECT BLADE 6 FLAT ULTRCLN (ELECTRODE) ×2 IMPLANT
ELECT REM PT RETURN 9FT ADLT (ELECTROSURGICAL) ×2
ELECTRODE REM PT RTRN 9FT ADLT (ELECTROSURGICAL) ×1 IMPLANT
GAUZE SPONGE 4X4 12PLY STRL (GAUZE/BANDAGES/DRESSINGS) ×2 IMPLANT
GLOVE BIO SURGEON STRL SZ 6.5 (GLOVE) ×3 IMPLANT
GLOVE BIOGEL PI IND STRL 6.5 (GLOVE) ×2 IMPLANT
GLOVE BIOGEL PI IND STRL 7.0 (GLOVE) ×5 IMPLANT
GLOVE BIOGEL PI INDICATOR 6.5 (GLOVE) ×2
GLOVE BIOGEL PI INDICATOR 7.0 (GLOVE) ×6
GLOVE ECLIPSE 6.5 STRL STRAW (GLOVE) ×3 IMPLANT
GLOVE SURG SS PI 7.5 STRL IVOR (GLOVE) ×4 IMPLANT
GOWN STRL REUS W/ TWL LRG LVL3 (GOWN DISPOSABLE) IMPLANT
GOWN STRL REUS W/TWL LRG LVL3 (GOWN DISPOSABLE) ×12 IMPLANT
HANDLE SUCTION POOLE (INSTRUMENTS) IMPLANT
HEMOSTAT SNOW SURGICEL 2X4 (HEMOSTASIS) ×2 IMPLANT
INST SET MAJOR GENERAL (KITS) ×2 IMPLANT
KIT TURNOVER KIT A (KITS) ×2 IMPLANT
LIGASURE IMPACT 36 18CM CVD LR (INSTRUMENTS) ×2 IMPLANT
MANIFOLD NEPTUNE II (INSTRUMENTS) ×2 IMPLANT
NDL HYPO 18GX1.5 BLUNT FILL (NEEDLE) ×1 IMPLANT
NDL HYPO 21X1.5 SAFETY (NEEDLE) ×1 IMPLANT
NEEDLE HYPO 18GX1.5 BLUNT FILL (NEEDLE) ×2 IMPLANT
NEEDLE HYPO 21X1.5 SAFETY (NEEDLE) ×2 IMPLANT
NS IRRIG 1000ML POUR BTL (IV SOLUTION) ×7 IMPLANT
PACK ABDOMINAL MAJOR (CUSTOM PROCEDURE TRAY) ×2 IMPLANT
PAD ARMBOARD 7.5X6 YLW CONV (MISCELLANEOUS) ×2 IMPLANT
RELOAD PROXIMATE 75MM BLUE (ENDOMECHANICALS) ×4 IMPLANT
RELOAD STAPLE 75 3.8 BLU REG (ENDOMECHANICALS) IMPLANT
RETRACTOR WND ALEXIS 18 MED (MISCELLANEOUS) IMPLANT
RTRCTR WOUND ALEXIS 18CM MED (MISCELLANEOUS) ×2
SET BASIN LINEN APH (SET/KITS/TRAYS/PACK) ×2 IMPLANT
SPONGE INTESTINAL PEANUT (DISPOSABLE) ×2 IMPLANT
SPONGE LAP 18X18 RF (DISPOSABLE) ×4 IMPLANT
STAPLER GUN LINEAR PROX 60 (STAPLE) ×1 IMPLANT
STAPLER PROXIMATE 75MM BLUE (STAPLE) ×1 IMPLANT
STAPLER VISISTAT (STAPLE) ×2 IMPLANT
SUCTION POOLE HANDLE (INSTRUMENTS) ×2
SUT SILK 2 0 (SUTURE) ×1
SUT SILK 2-0 18XBRD TIE 12 (SUTURE) IMPLANT
SUT SILK 3 0 SH CR/8 (SUTURE) ×2 IMPLANT
SUT VIC AB 0 CT1 27 (SUTURE) ×3
SUT VIC AB 0 CT1 27XBRD ANTBC (SUTURE) ×2 IMPLANT
SUT VIC AB 0 CT1 27XCR 8 STRN (SUTURE) ×1 IMPLANT
SYR 20CC LL (SYRINGE) ×3 IMPLANT
TOWEL SURG RFD BLUE STRL DISP (DISPOSABLE) ×1 IMPLANT
TRAY FOLEY MTR SLVR 16FR STAT (SET/KITS/TRAYS/PACK) ×2 IMPLANT

## 2019-04-23 NOTE — Progress Notes (Signed)
RN adv Dr. [name] of pts minimal output since start of shift as well as 7mL documented @ 1823- new order for 1062mL bolus over 2 hours. Will continue to monitor pt.

## 2019-04-23 NOTE — Anesthesia Procedure Notes (Signed)
Procedure Name: Intubation Date/Time: 04/23/2019 10:05 AM Performed by: Charmaine Downs, CRNA Pre-anesthesia Checklist: Emergency Drugs available, Suction available, Patient being monitored and Patient identified Patient Re-evaluated:Patient Re-evaluated prior to induction Oxygen Delivery Method: Circle system utilized Preoxygenation: Pre-oxygenation with 100% oxygen Induction Type: IV induction, Rapid sequence and Cricoid Pressure applied Ventilation: Mask ventilation with difficulty Laryngoscope Size: Glidescope Grade View: Grade III Tube size: 8.0 mm Number of attempts: 1 Airway Equipment and Method: Video-laryngoscopy and Rigid stylet Placement Confirmation: ETT inserted through vocal cords under direct vision,  positive ETCO2 and breath sounds checked- equal and bilateral Secured at: 22 cm Tube secured with: Tape Dental Injury: Teeth and Oropharynx as per pre-operative assessment  Difficulty Due To: Difficulty was anticipated and Difficult Airway- due to limited oral opening Future Recommendations: Recommend- induction with short-acting agent, and alternative techniques readily available Comments: Lo-Pro #3 used with easy visualization.

## 2019-04-23 NOTE — Anesthesia Preprocedure Evaluation (Signed)
Anesthesia Evaluation    Airway Mallampati: III       Dental  (+) Poor Dentition   Pulmonary     + decreased breath sounds      Cardiovascular hypertension,  Rhythm:regular     Neuro/Psych    GI/Hepatic   Endo/Other  diabetes  Renal/GU ARF and CRF     Musculoskeletal   Abdominal   Peds  Hematology  (+) Blood dyscrasia, anemia ,   Anesthesia Other Findings Fistula from GB to bowel- requires open procedure 2 units RBC available Gait change- denies CVA, TIA 15 yr hx DM2 12 lead EKG SR at 94 EGD 5/29, no anesthesia issues Anticipate DIFFICULT AIRWAY  Reproductive/Obstetrics                             Anesthesia Physical Anesthesia Plan  ASA: III  Anesthesia Plan: General   Post-op Pain Management:    Induction:   PONV Risk Score and Plan:   Airway Management Planned:   Additional Equipment:   Intra-op Plan:   Post-operative Plan:   Informed Consent: I have reviewed the patients History and Physical, chart, labs and discussed the procedure including the risks, benefits and alternatives for the proposed anesthesia with the patient or authorized representative who has indicated his/her understanding and acceptance.     Dental Advisory Given  Plan Discussed with: Anesthesiologist  Anesthesia Plan Comments:         Anesthesia Quick Evaluation

## 2019-04-23 NOTE — Anesthesia Postprocedure Evaluation (Signed)
Anesthesia Post Note  Patient: Stephen Rogers.  Procedure(s) Performed: CHOLECYSTECTOMY (N/A ) PARTIAL COLECTOMY (N/A )  Patient location during evaluation: PACU Anesthesia Type: General Level of consciousness: awake and patient cooperative Pain management: pain level controlled Vital Signs Assessment: post-procedure vital signs reviewed and stable Respiratory status: spontaneous breathing, nonlabored ventilation and respiratory function stable Cardiovascular status: blood pressure returned to baseline Postop Assessment: no apparent nausea or vomiting Anesthetic complications: no     Last Vitals:  Vitals:   04/23/19 1300 04/23/19 1315  BP: (!) 104/54   Pulse: (!) 101 99  Resp: (!) 23 (!) 21  Temp:    SpO2: 100% 94%    Last Pain:  Vitals:   04/23/19 1300  TempSrc:   PainSc: Asleep                 Dawood Spitler J

## 2019-04-23 NOTE — Interval H&P Note (Signed)
History and Physical Interval Note:  04/23/2019 9:28 AM  Stephen Bales Sr.  has presented today for surgery, with the diagnosis of cholecolo fistula.  The various methods of treatment have been discussed with the patient and family. After consideration of risks, benefits and other options for treatment, the patient has consented to  Procedure(s): CHOLECYSTECTOMY (N/A) PARTIAL COLECTOMY (N/A) as a surgical intervention.  The patient's history has been reviewed, patient examined, no change in status, stable for surgery.  I have reviewed the patient's chart and labs.  Questions were answered to the patient's satisfaction.     Aviva Signs

## 2019-04-23 NOTE — Op Note (Signed)
Patient:  Stephen DUBUC Sr.  DOB:  [birth year]  MRN:  [id number]   Preop Diagnosis: Cholecystitis with cholelithiasis, cholecystocolonic fistula  Postop Diagnosis: Same  Procedure: Open cholecystectomy, partial colectomy  Surgeon: Aviva Signs, MD  Assistant: Curlene Labrum, MD  Anes: General endotracheal  Indications: Patient is a 78 year old black male who presented to [facility] with blood per rectum.  CT scan and colonoscopy were performed which revealed cholelithiasis with a fistula between the gallbladder and the hepatic flexure of the colon.  Patient now comes to the operating room for an open cholecystectomy with repair of colon.  The risks and benefits of the procedure including bleeding, infection, hepatobiliary injury, the possibility of a blood transfusion, and the possibility of an anastomotic leak and colon resection were fully explained to the patient, who gave informed consent.  Procedure note: The patient was placed in the supine position.  After induction of general endotracheal anesthesia, the abdomen was prepped and draped using the usual sterile technique with ChloraPrep.  Surgical site confirmation was performed.  A right subcostal incision was made into the peritoneal cavity.  The peritoneal cavity was entered into without difficulty.  The liver was somewhat superior to the edge of the costal margin.  There was a significant amount of inflammatory response between the colon and the gallbladder.  The gallbladder was full of stones and had a thickened gallbladder wall.  Evidence of previously injected blue dye was noted from the colonoscopy.  The hepatic flexure was taken down and the colon was freed away from the fundus of the gallbladder using both sharp and blunt dissection.  While doing this, a defect was noted in the colon which was a part of the good-sized fistula.  It did not lend itself to a primary repair.  There was some stool spillage at this time.  It  was contained.  An approximate 8 cm segment of colon was resected.  A GIA-75 stapler was placed proximally distally around the area of colon disruption.  The mesentery was divided using the LigaSure.  We temporarily then turned our attention to the gallbladder.  The gallbladder was freed from the liver in a retrograde fashion using both finger dissection and cautery.  The common duct was fully visualized.  I was able to get the gallbladder up on the pedicle.  Multiple stones were noted to emanate from the gallbladder.  These were all gathered and removed from the operative field.  A large clip applier was used to come across the cystic duct as well as the cystic artery.  The gallbladder was freed away from these attachments and sent to pathology for further examination.  The gallbladder bed was inspected and no bile leakage or bleeding was noted.  The common duct was fully visualized.  The liver was inspected and noted to be in normal limits.  No ischemic changes were noted.  The right upper quadrant was then copiously irrigated with normal saline.  Surgicel was placed on the gallbladder fossa.  The colon was then reattached to the side to side anastomosis using a GIA 75 stapler.  The colotomy was closed using a TA 60 stapler.  The staple line was bolstered using 3-0 silk sutures.  Surrounding omentum was placed over the anastomotic line.  The right upper quadrant was then copiously irrigated with normal saline.  All operating personnel then changed her gown and gloves.  A new set up was used for closure.  The posterior fascia and peritoneum were closed using  a running 0 Vicryl suture.  The anterior abdominal wall fascia was reapproximated using interrupted 0 Vicryl sutures.  Subcutaneous layer was irrigated with normal saline.  Exparel was instilled into the surrounding wound.  The skin was closed using staples.  Betadine ointment and dry sterile dressings were applied.  All tip needle counts were correct at  the end of the procedure.  The patient was extubated in the operating room and transferred to PACU in stable condition.  He will be transferred to the ICU as his blood pressure was relatively low to start off the case and throughout the case.  Complications: None  EBL: 100 cc  Specimen: Gallbladder, partial colon

## 2019-04-23 NOTE — Progress Notes (Signed)
04/23/2019 5:56 PM  Pt was seen briefly in recovery from surgery.  His vitals are improving nicely.  He is resting well.  He will be monitored in ICU.    04/23/2019 5:57 PM  Stephen Bales Sr. was seen and examined.  The H&P by the admitting provider, orders, imaging was reviewed.  Please see new orders.  Will continue to follow.   Vitals:   04/23/19 1515 04/23/19 1602  BP: 111/62 108/69  Pulse: 99 (!) 103  Resp: 15 16  Temp:  98.5 F (36.9 C)  SpO2: 100% 100%    Results for orders placed or performed during the hospital encounter of 04/18/19  SARS Coronavirus 2 (CEPHEID - Performed in [place] hospital lab), [facility] Order  Result Value Ref Range   SARS Coronavirus 2 NEGATIVE NEGATIVE  Surgical pcr screen  Result Value Ref Range   MRSA, PCR NEGATIVE NEGATIVE   Staphylococcus aureus NEGATIVE NEGATIVE  Comprehensive metabolic panel  Result Value Ref Range   Sodium 142 135 - 145 mmol/L   Potassium 4.4 3.5 - 5.1 mmol/L   Chloride 106 98 - 111 mmol/L   CO2 21 (L) 22 - 32 mmol/L   Glucose, Bld 191 (H) 70 - 99 mg/dL   BUN 21 8 - 23 mg/dL   Creatinine, Ser 1.63 (H) 0.61 - 1.24 mg/dL   Calcium 8.6 (L) 8.9 - 10.3 mg/dL   Total Protein 6.4 (L) 6.5 - 8.1 g/dL   Albumin 3.6 3.5 - 5.0 g/dL   AST 31 15 - 41 U/L   ALT 25 0 - 44 U/L   Alkaline Phosphatase 119 38 - 126 U/L   Total Bilirubin 0.7 0.3 - 1.2 mg/dL   GFR calc non Af Amer 39 (L) >60 mL/min   GFR calc Af Amer 46 (L) >60 mL/min   Anion gap 15 5 - 15  CBC WITH DIFFERENTIAL  Result Value Ref Range   WBC 13.5 (H) 4.0 - 10.5 K/uL   RBC 3.75 (L) 4.22 - 5.81 MIL/uL   Hemoglobin 11.1 (L) 13.0 - 17.0 g/dL   HCT 36.2 (L) 39.0 - 52.0 %   MCV 96.5 80.0 - 100.0 fL   MCH 29.6 26.0 - 34.0 pg   MCHC 30.7 30.0 - 36.0 g/dL   RDW 14.3 11.5 - 15.5 %   Platelets 288 150 - 400 K/uL   nRBC 0.0 0.0 - 0.2 %   Neutrophils Relative % 65 %   Neutro Abs 8.7 (H) 1.7 - 7.7 K/uL   Lymphocytes Relative 29 %   Lymphs Abs 3.9 0.7 - 4.0 K/uL   Monocytes Relative 5 %   Monocytes Absolute 0.7 0.1 - 1.0 K/uL   Eosinophils Relative 1 %   Eosinophils Absolute 0.1 0.0 - 0.5 K/uL   Basophils Relative 0 %   Basophils Absolute 0.1 0.0 - 0.1 K/uL   Immature Granulocytes 0 %   Abs Immature Granulocytes 0.04 0.00 - 0.07 K/uL  Protime-INR  Result Value Ref Range   Prothrombin Time 12.9 11.4 - 15.2 seconds   INR 1.0 0.8 - 1.2  CBC  Result Value Ref Range   WBC 10.6 (H) 4.0 - 10.5 K/uL   RBC 3.21 (L) 4.22 - 5.81 MIL/uL   Hemoglobin 9.5 (L) 13.0 - 17.0 g/dL   HCT 30.9 (L) 39.0 - 52.0 %   MCV 96.3 80.0 - 100.0 fL   MCH 29.6 26.0 - 34.0 pg   MCHC 30.7 30.0 - 36.0 g/dL   RDW  14.3 11.5 - 15.5 %   Platelets 234 150 - 400 K/uL   nRBC 0.0 0.0 - 0.2 %  Comprehensive metabolic panel  Result Value Ref Range   Sodium 143 135 - 145 mmol/L   Potassium 4.0 3.5 - 5.1 mmol/L   Chloride 108 98 - 111 mmol/L   CO2 27 22 - 32 mmol/L   Glucose, Bld 143 (H) 70 - 99 mg/dL   BUN 20 8 - 23 mg/dL   Creatinine, Ser 1.18 0.61 - 1.24 mg/dL   Calcium 8.1 (L) 8.9 - 10.3 mg/dL   Total Protein 5.7 (L) 6.5 - 8.1 g/dL   Albumin 3.1 (L) 3.5 - 5.0 g/dL   AST 22 15 - 41 U/L   ALT 25 0 - 44 U/L   Alkaline Phosphatase 94 38 - 126 U/L   Total Bilirubin 0.7 0.3 - 1.2 mg/dL   GFR calc non Af Amer 58 (L) >60 mL/min   GFR calc Af Amer >60 >60 mL/min   Anion gap 8 5 - 15  CBC  Result Value Ref Range   WBC 8.2 4.0 - 10.5 K/uL   RBC 3.08 (L) 4.22 - 5.81 MIL/uL   Hemoglobin 9.1 (L) 13.0 - 17.0 g/dL   HCT 28.9 (L) 39.0 - 52.0 %   MCV 93.8 80.0 - 100.0 fL   MCH 29.5 26.0 - 34.0 pg   MCHC 31.5 30.0 - 36.0 g/dL   RDW 14.5 11.5 - 15.5 %   Platelets 225 150 - 400 K/uL   nRBC 0.0 0.0 - 0.2 %  Glucose, capillary  Result Value Ref Range   Glucose-Capillary 113 (H) 70 - 99 mg/dL  Glucose, capillary  Result Value Ref Range   Glucose-Capillary 133 (H) 70 - 99 mg/dL  Glucose, capillary  Result Value Ref Range   Glucose-Capillary 103 (H) 70 - 99 mg/dL  Glucose,  capillary  Result Value Ref Range   Glucose-Capillary 155 (H) 70 - 99 mg/dL   Comment 1 Notify RN    Comment 2 Document in Chart   Glucose, capillary  Result Value Ref Range   Glucose-Capillary 156 (H) 70 - 99 mg/dL  Glucose, capillary  Result Value Ref Range   Glucose-Capillary 104 (H) 70 - 99 mg/dL  Glucose, capillary  Result Value Ref Range   Glucose-Capillary 180 (H) 70 - 99 mg/dL  Glucose, capillary  Result Value Ref Range   Glucose-Capillary 130 (H) 70 - 99 mg/dL  Glucose, capillary  Result Value Ref Range   Glucose-Capillary 118 (H) 70 - 99 mg/dL  CBC  Result Value Ref Range   WBC 6.4 4.0 - 10.5 K/uL   RBC 3.06 (L) 4.22 - 5.81 MIL/uL   Hemoglobin 9.0 (L) 13.0 - 17.0 g/dL   HCT 28.6 (L) 39.0 - 52.0 %   MCV 93.5 80.0 - 100.0 fL   MCH 29.4 26.0 - 34.0 pg   MCHC 31.5 30.0 - 36.0 g/dL   RDW 14.3 11.5 - 15.5 %   Platelets 219 150 - 400 K/uL   nRBC 0.0 0.0 - 0.2 %  Comprehensive metabolic panel  Result Value Ref Range   Sodium 138 135 - 145 mmol/L   Potassium 4.2 3.5 - 5.1 mmol/L   Chloride 106 98 - 111 mmol/L   CO2 23 22 - 32 mmol/L   Glucose, Bld 147 (H) 70 - 99 mg/dL   BUN 12 8 - 23 mg/dL   Creatinine, Ser 1.15 0.61 - 1.24 mg/dL   Calcium 8.2 (  L) 8.9 - 10.3 mg/dL   Total Protein 6.1 (L) 6.5 - 8.1 g/dL   Albumin 3.3 (L) 3.5 - 5.0 g/dL   AST 51 (H) 15 - 41 U/L   ALT 71 (H) 0 - 44 U/L   Alkaline Phosphatase 109 38 - 126 U/L   Total Bilirubin 0.5 0.3 - 1.2 mg/dL   GFR calc non Af Amer >60 >60 mL/min   GFR calc Af Amer >60 >60 mL/min   Anion gap 9 5 - 15  Glucose, capillary  Result Value Ref Range   Glucose-Capillary 124 (H) 70 - 99 mg/dL  Glucose, capillary  Result Value Ref Range   Glucose-Capillary 80 70 - 99 mg/dL  Glucose, capillary  Result Value Ref Range   Glucose-Capillary 153 (H) 70 - 99 mg/dL  Glucose, capillary  Result Value Ref Range   Glucose-Capillary 83 70 - 99 mg/dL  CBC  Result Value Ref Range   WBC 5.5 4.0 - 10.5 K/uL   RBC 2.96 (L)  4.22 - 5.81 MIL/uL   Hemoglobin 8.7 (L) 13.0 - 17.0 g/dL   HCT 27.7 (L) 39.0 - 52.0 %   MCV 93.6 80.0 - 100.0 fL   MCH 29.4 26.0 - 34.0 pg   MCHC 31.4 30.0 - 36.0 g/dL   RDW 14.4 11.5 - 15.5 %   Platelets 229 150 - 400 K/uL   nRBC 0.0 0.0 - 0.2 %  Comprehensive metabolic panel  Result Value Ref Range   Sodium 140 135 - 145 mmol/L   Potassium 4.0 3.5 - 5.1 mmol/L   Chloride 106 98 - 111 mmol/L   CO2 26 22 - 32 mmol/L   Glucose, Bld 142 (H) 70 - 99 mg/dL   BUN 9 8 - 23 mg/dL   Creatinine, Ser 1.13 0.61 - 1.24 mg/dL   Calcium 8.6 (L) 8.9 - 10.3 mg/dL   Total Protein 6.2 (L) 6.5 - 8.1 g/dL   Albumin 3.3 (L) 3.5 - 5.0 g/dL   AST 36 15 - 41 U/L   ALT 55 (H) 0 - 44 U/L   Alkaline Phosphatase 110 38 - 126 U/L   Total Bilirubin 0.6 0.3 - 1.2 mg/dL   GFR calc non Af Amer >60 >60 mL/min   GFR calc Af Amer >60 >60 mL/min   Anion gap 8 5 - 15  Glucose, capillary  Result Value Ref Range   Glucose-Capillary 108 (H) 70 - 99 mg/dL  Glucose, capillary  Result Value Ref Range   Glucose-Capillary 153 (H) 70 - 99 mg/dL  Glucose, capillary  Result Value Ref Range   Glucose-Capillary 151 (H) 70 - 99 mg/dL  CBC  Result Value Ref Range   WBC 7.2 4.0 - 10.5 K/uL   RBC 3.16 (L) 4.22 - 5.81 MIL/uL   Hemoglobin 9.4 (L) 13.0 - 17.0 g/dL   HCT 29.6 (L) 39.0 - 52.0 %   MCV 93.7 80.0 - 100.0 fL   MCH 29.7 26.0 - 34.0 pg   MCHC 31.8 30.0 - 36.0 g/dL   RDW 14.3 11.5 - 15.5 %   Platelets 293 150 - 400 K/uL   nRBC 0.3 (H) 0.0 - 0.2 %  Glucose, capillary  Result Value Ref Range   Glucose-Capillary 109 (H) 70 - 99 mg/dL   Comment 1 Notify RN    Comment 2 Document in Chart   Glucose, capillary  Result Value Ref Range   Glucose-Capillary 155 (H) 70 - 99 mg/dL   Comment 1 Notify RN  Comment 2 Document in Chart   Glucose, capillary  Result Value Ref Range   Glucose-Capillary 131 (H) 70 - 99 mg/dL  Glucose, capillary  Result Value Ref Range   Glucose-Capillary 142 (H) 70 - 99 mg/dL  Glucose,  capillary  Result Value Ref Range   Glucose-Capillary 118 (H) 70 - 99 mg/dL  Glucose, capillary  Result Value Ref Range   Glucose-Capillary 142 (H) 70 - 99 mg/dL  Glucose, capillary  Result Value Ref Range   Glucose-Capillary 120 (H) 70 - 99 mg/dL  Glucose, capillary  Result Value Ref Range   Glucose-Capillary 171 (H) 70 - 99 mg/dL  CBC  Result Value Ref Range   WBC 7.5 4.0 - 10.5 K/uL   RBC 3.41 (L) 4.22 - 5.81 MIL/uL   Hemoglobin 10.1 (L) 13.0 - 17.0 g/dL   HCT 32.7 (L) 39.0 - 52.0 %   MCV 95.9 80.0 - 100.0 fL   MCH 29.6 26.0 - 34.0 pg   MCHC 30.9 30.0 - 36.0 g/dL   RDW 14.3 11.5 - 15.5 %   Platelets 287 150 - 400 K/uL   nRBC 0.0 0.0 - 0.2 %  Creatinine, serum  Result Value Ref Range   Creatinine, Ser 1.63 (H) 0.61 - 1.24 mg/dL   GFR calc non Af Amer 39 (L) >60 mL/min   GFR calc Af Amer 46 (L) >60 mL/min  Glucose, capillary  Result Value Ref Range   Glucose-Capillary 160 (H) 70 - 99 mg/dL  Type and screen [facility]  Result Value Ref Range   ABO/RH(D)      B POS Performed at [facility], [address], [place], [place] [address]    Antibody Screen      POS Performed at [facility], [address], [place], [place] [address]    Sample Expiration      04/21/2019,2359 Performed at [facility], [address] [place], [place] [address]    Antibody Identification      NO CLINICALLY SIGNIFICANT ANTIBODY IDENTIFIED Performed at [facility], [place] [address], [place], [place] [address]   Type and screen [facility]  Result Value Ref Range   ABO/RH(D) B POS    Antibody Screen POS    Sample Expiration 04/25/2019,2359    Antibody Identification NO CLINICALLY SIGNIFICANT ANTIBODY IDENTIFIED    Unit Number      [id number] Performed at [facility], [place] [address] [place], [place] [address]    Blood Component Type      RED CELLS,LR Performed at [facility] [address],  [place], [place] [address]    Unit division      00 Performed at [facility] [place] [address] [place], [place] [address]    Status of Unit      ALLOCATED Performed at [facility], [address], [place], [place] [address]    Transfusion Status OK TO TRANSFUSE    Crossmatch Result COMPATIBLE   Prepare RBC  Result Value Ref Range   Order Confirmation      ORDER PROCESSED BY BLOOD BANK Performed at [facility], [address] [place], [place] [address]   BPAM [facility]  Result Value Ref Range   Blood Product Unit Number [id number]    PRODUCT CODE [id number]    Unit Type and Rh 7300    Blood Product Expiration Date [id number]      C. Wynetta Emery, MD [facility]   04/18/2019  3:20 PM  How to contact the [facility] Attending or Consulting provider [place] or covering provider during after hours [place], for this patient?  1. Check the care team in [facility] and look for a) attending/consulting [facility] provider listed and b) the [facility] team listed 2. Log into [email or web address] and use [place]'s universal password to access. If you do not have the password, please contact the hospital operator. 3. Locate the [facility] provider you are looking for under [facility] and page to a number that you can be directly reached. 4. If you still have difficulty reaching the provider, please page the [facility] (Director on Call) for the Hospitalists listed on amion for assistance.   Vitals:   04/23/19 1515 04/23/19 1602  BP: 111/62 108/69  Pulse: 99 (!) 103  Resp: 15 16  Temp:  98.5 F (36.9 C)  SpO2: 100% 100%

## 2019-04-23 NOTE — Transfer of Care (Signed)
Immediate Anesthesia Transfer of Care Note  Patient: Stephen Bales Sr.  Procedure(s) Performed: CHOLECYSTECTOMY (N/A ) PARTIAL COLECTOMY (N/A )  Patient Location: PACU  Anesthesia Type:General  Level of Consciousness: drowsy  Airway & Oxygen Therapy: Patient Spontanous Breathing and Patient connected to face mask oxygen  Post-op Assessment: Report given to RN and Post -op Vital signs reviewed and stable  Post vital signs: Reviewed and stable  Last Vitals:  Vitals Value Taken Time  BP 119/60 04/23/2019 12:30 PM  Temp    Pulse 99 04/23/2019 12:31 PM  Resp 27 04/23/2019 12:31 PM  SpO2 98 % 04/23/2019 12:31 PM  Vitals shown include unvalidated device data.  Last Pain:  Vitals:   04/23/19 0854  TempSrc: Oral  PainSc: 0-No pain      Patients Stated Pain Goal: 5 (83/46/21 9471)  Complications: No apparent anesthesia complications

## 2019-04-23 NOTE — Care Management Important Message (Signed)
Important Message  Patient Details  Name: Stephen THIEME Sr. MRN: [id number] Date of Birth: 1941-02-19   Medicare Important Message Given:  Yes    Tommy Medal 04/23/2019, 2:47 PM

## 2019-04-24 ENCOUNTER — Encounter (HOSPITAL_COMMUNITY): Payer: Self-pay | Admitting: General Surgery

## 2019-04-24 LAB — GLUCOSE, CAPILLARY
Glucose-Capillary: 203 mg/dL — ABNORMAL HIGH (ref 70–99)
Glucose-Capillary: 173 mg/dL — ABNORMAL HIGH (ref 70–99)
Glucose-Capillary: 163 mg/dL — ABNORMAL HIGH (ref 70–99)
Glucose-Capillary: 139 mg/dL — ABNORMAL HIGH (ref 70–99)

## 2019-04-24 LAB — CBC
HCT: 28.7 % — ABNORMAL LOW (ref 39.0–52.0)
Hemoglobin: 8.9 g/dL — ABNORMAL LOW (ref 13.0–17.0)
MCH: 29.7 pg (ref 26.0–34.0)
MCHC: 31 g/dL (ref 30.0–36.0)
MCV: 95.7 fL (ref 80.0–100.0)
Platelets: 240 10*3/uL (ref 150–400)
RBC: 3 MIL/uL — ABNORMAL LOW (ref 4.22–5.81)
RDW: 14.4 % (ref 11.5–15.5)
WBC: 12.2 10*3/uL — ABNORMAL HIGH (ref 4.0–10.5)
nRBC: 0 % (ref 0.0–0.2)

## 2019-04-24 LAB — COMPREHENSIVE METABOLIC PANEL
ALT: 47 U/L — ABNORMAL HIGH (ref 0–44)
AST: 42 U/L — ABNORMAL HIGH (ref 15–41)
Albumin: 2.8 g/dL — ABNORMAL LOW (ref 3.5–5.0)
Alkaline Phosphatase: 81 U/L (ref 38–126)
Anion gap: 10 (ref 5–15)
BUN: 19 mg/dL (ref 8–23)
CO2: 20 mmol/L — ABNORMAL LOW (ref 22–32)
Calcium: 7.4 mg/dL — ABNORMAL LOW (ref 8.9–10.3)
Chloride: 106 mmol/L (ref 98–111)
Creatinine, Ser: 1.48 mg/dL — ABNORMAL HIGH (ref 0.61–1.24)
GFR calc Af Amer: 51 mL/min — ABNORMAL LOW (ref >60)
GFR calc non Af Amer: 44 mL/min — ABNORMAL LOW (ref >60)
Glucose, Bld: 227 mg/dL — ABNORMAL HIGH (ref 70–99)
Potassium: 5.4 mmol/L — ABNORMAL HIGH (ref 3.5–5.1)
Sodium: 136 mmol/L (ref 135–145)
Total Bilirubin: 0.4 mg/dL (ref 0.3–1.2)
Total Protein: 5.6 g/dL — ABNORMAL LOW (ref 6.5–8.1)

## 2019-04-24 LAB — PHOSPHORUS: Phosphorus: 3.4 mg/dL (ref 2.5–4.6)

## 2019-04-24 LAB — MAGNESIUM: Magnesium: 1.5 mg/dL — ABNORMAL LOW (ref 1.7–2.4)

## 2019-04-24 MED ORDER — CHLORHEXIDINE GLUCONATE CLOTH 2 % EX PADS
6.0000 | MEDICATED_PAD | Freq: Every day | CUTANEOUS | Status: DC
  Administered 2019-04-24 – 2019-05-03 (×7): 6 via TOPICAL

## 2019-04-24 MED ORDER — SODIUM ZIRCONIUM CYCLOSILICATE 10 G PO PACK
10.0000 g | PACK | Freq: Three times a day (TID) | ORAL | Status: AC
  Administered 2019-04-24 (×2): 10 g via ORAL
  Filled 2019-04-24 (×2): qty 1

## 2019-04-24 NOTE — Progress Notes (Signed)
1 Day Post-Op  Subjective: Mild incisional pain but seems to be well controlled.  Objective: Vital signs in last 24 hours: Temp:  [97.2 F (36.2 C)-99.4 F (37.4 C)] 98.4 F (36.9 C) (06/02 1119) Pulse Rate:  [93-136] 121 (06/02 1130) Resp:  [0-26] 26 (06/02 1130) BP: (82-133)/(52-80) 95/68 (06/02 1000) SpO2:  [90 %-100 %] 96 % (06/02 1130) Weight:  [90.7 kg-94.4 kg] 94.4 kg (06/02 0500) Last BM Date: 04/18/19  Intake/Output from previous day: 06/01 0701 - 06/02 0700 In: 3750.9 [P.O.:440; I.V.:3210.4; IV Piggyback:100.5] Out: 1050 [Urine:950; Blood:100] Intake/Output this shift: Total I/O In: 1234 [I.V.:1234] Out: -   General appearance: alert, cooperative and no distress Resp: clear to auscultation bilaterally Cardio: regular rate and rhythm, S1, S2 normal, no murmur, click, rub or gallop GI: Soft, incision healing well.  Lab Results:  Recent Labs    04/23/19 1510 04/24/19 0435  WBC 7.5 12.2*  HGB 10.1* 8.9*  HCT 32.7* 28.7*  PLT 287 240   BMET Recent Labs    04/23/19 1510 04/24/19 0435  NA  --  136  K  --  5.4*  CL  --  106  CO2  --  20*  GLUCOSE  --  227*  BUN  --  19  CREATININE 1.63* 1.48*  CALCIUM  --  7.4*   PT/INR No results for input(s): LABPROT, INR in the last 72 hours.  Studies/Results: No results found.  Anti-infectives: Anti-infectives (From admission, onward)   Start     Dose/Rate Route Frequency Ordered Stop   04/24/19 1030  ertapenem (INVANZ) 1,000 mg in sodium chloride 0.9 % 50 mL IVPB     1 g 100 mL/hr over 30 Minutes Intravenous Every 24 hours 04/23/19 1426     04/23/19 1000  ertapenem (INVANZ) 1,000 mg in sodium chloride 0.9 % 100 mL IVPB     1 g 200 mL/hr over 30 Minutes Intravenous  Once 04/23/19 0930 04/23/19 1345   04/22/19 1030  ertapenem (INVANZ) 1,000 mg in sodium chloride 0.9 % 100 mL IVPB     1 g 200 mL/hr over 30 Minutes Intravenous On call to O.R. 04/22/19 1021 04/23/19 0559      Assessment/Plan: s/p  Procedure(s): CHOLECYSTECTOMY PARTIAL COLECTOMY Impression: Stable on postoperative day 1.  Creatinine slightly elevated but improving.  Mild hyperkalemia.  This is being treated. Plan: Continue IV fluids.  Continue clear liquid diet.  Recheck labs in a.m.  LOS: 4 days    Aviva Signs 04/24/2019

## 2019-04-24 NOTE — Plan of Care (Signed)
  Problem: Acute Rehab PT Goals(only PT should resolve) Goal: Pt Will Go Supine/Side To Sit Outcome: Progressing Flowsheets (Taken 04/24/2019 1238) Pt will go Supine/Side to Sit: with min guard assist Goal: Patient Will Transfer Sit To/From Stand Outcome: Progressing Flowsheets (Taken 04/24/2019 1238) Patient will transfer sit to/from stand: with supervision Goal: Pt Will Transfer Bed To Chair/Chair To Bed Outcome: Progressing Flowsheets (Taken 04/24/2019 1238) Pt will Transfer Bed to Chair/Chair to Bed: min guard assist Goal: Pt Will Ambulate Outcome: Progressing Flowsheets (Taken 04/24/2019 1238) Pt will Ambulate: 50 feet; with min guard assist; with rolling walker   12:39 PM, 04/24/19 Lonell Grandchild, MPT Physical Therapist with [facility] [phone] office [phone] mobile phone

## 2019-04-24 NOTE — Progress Notes (Addendum)
PROGRESS NOTE  Stephen FLINK Sr.  JWJ:[id number]  DOB: 04/08/1941  DOA: 04/18/2019 PCP: [name], MD   Brief Admission Hx: 78 y.o. male admitted with painless rectal bleeding. 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   MDM/Assessment & Plan:   1. Postop day #1 s/p open cholecystectomy and partial colectomy  - Pt had colonscopy done 5/30 with findings of abnormal area of colon at hepatic flexure.  He had a CT abdomen with findings of a fistula involving the gallbladder and colon.  Surgery was consulted.  He was taken to OR on 6/1.  He is recovering nicely in the stepdown ICU.  He is tolerating diet and pain well controlled.     2. 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  Pt now on clear liquid diet and tolerating diet well. 3. Hyperkalemia - ordered lokelma x 2 doses.  Following lytes.    4. Acute blood loss anemia -  Hg 8.9. Follow.   5. Leukocytosis - post op, recheck CBC in AM.  6. Type 2 DM - continue sliding scale coverage and CBG testing.   DVT prophylaxis: lovenox / SCDs Code Status: Full  Family Communication: daughter updated telephone  Disposition Plan: inpatient  Consultants:  GI  Procedures: Impression:     - One 4 mm polyp at the recto-sigmoid colon,                            removed with a cold snare. Resected and retrieved.                            Colonic lipoma.                           Markedly abnormal area of colon at the hepatic                            flexure of uncertain etiology. (Query fistula,                            atypical presentation for infiltrating tumor or                            possibly related to previously placed                            cholecystostomy tube). This area is suspicious for                            site of bleeding.  Antimicrobials:   Invanz 6/1 >>  Subjective: Pt tolerating diet and pain well controlled.        Objective: Vitals:   04/24/19 0745 04/24/19 0800 04/24/19 0810 04/24/19 0830  BP:  103/70    Pulse: (!) 112 (!) 136  (!) 116  Resp: 20 17  (!) 21  Temp:      TempSrc:      SpO2: 94% 90% 93% 95%  Weight:      Height:        Intake/Output Summary (Last 24 hours) at  04/24/2019 1013 Last data filed at 04/24/2019 0755 Gross per 24 hour  Intake 4087.29 ml  Output 1050 ml  Net 3037.29 ml   Filed Weights   04/23/19 0500 04/23/19 1335 04/24/19 0500  Weight: 90.1 kg 90.7 kg 94.4 kg   REVIEW OF SYSTEMS  As per history otherwise all reviewed and reported negative  Exam:  General exam: awake, alert, NAD, cooperative.   Respiratory system: Clear. No increased work of breathing. Cardiovascular system: S1 & S2 heard. No JVD, murmurs, gallops, clicks or pedal edema. Gastrointestinal system: Abdomen is soft, hypoactive BS, bandages clean and dry.  Central nervous system: Alert and oriented. No focal neurological deficits. Extremities: no CCE. SCDs.   Data Reviewed: Basic Metabolic Panel: Recent Labs  Lab 04/18/19 1545 04/19/19 0424 04/20/19 0855 04/21/19 0617 04/23/19 1510 04/24/19 0435  NA 142 143 138 140  --  136  K 4.4 4.0 4.2 4.0  --  5.4*  CL 106 108 106 106  --  106  CO2 21* 27 23 26   --  20*  GLUCOSE 191* 143* 147* 142*  --  227*  BUN 21 20 12 9   --  19  CREATININE 1.63* 1.18 1.15 1.13 1.63* 1.48*  CALCIUM 8.6* 8.1* 8.2* 8.6*  --  7.4*  MG  --   --   --   --   --  1.5*  PHOS  --   --   --   --   --  3.4   Liver Function Tests: Recent Labs  Lab 04/18/19 1545 04/19/19 0424 04/20/19 0855 04/21/19 0617 04/24/19 0435  AST 31 22 51* 36 42*  ALT 25 25 71* 55* 47*  ALKPHOS 119 94 109 110 81  BILITOT 0.7 0.7 0.5 0.6 0.4  PROT 6.4* 5.7* 6.1* 6.2* 5.6*  ALBUMIN 3.6 3.1* 3.3* 3.3* 2.8*   No results for input(s): LIPASE, AMYLASE in the last 168 hours. No results for input(s): AMMONIA in the last 168 hours. CBC: Recent Labs  Lab 04/18/19 1545  04/20/19 0855  04/21/19 0617 04/22/19 0458 04/23/19 1510 04/24/19 0435  WBC 13.5*   < > 6.4 5.5 7.2 7.5 12.2*  NEUTROABS 8.7*  --   --   --   --   --   --   HGB 11.1*   < > 9.0* 8.7* 9.4* 10.1* 8.9*  HCT 36.2*   < > 28.6* 27.7* 29.6* 32.7* 28.7*  MCV 96.5   < > 93.5 93.6 93.7 95.9 95.7  PLT 288   < > 219 229 293 287 240   < > = values in this interval not displayed.   Cardiac Enzymes: No results for input(s): CKTOTAL, CKMB, CKMBINDEX, TROPONINI in the last 168 hours. CBG (last 3)  Recent Labs    04/23/19 1604 04/23/19 2048 04/24/19 0733  GLUCAP 160* 219* 203*   Recent Results (from the past 240 hour(s))  SARS Coronavirus 2 (CEPHEID - Performed in [place] hospital lab), Hosp Order     Status: None   Collection Time: 04/18/19  5:08 PM  Result Value Ref Range Status   SARS Coronavirus 2 NEGATIVE NEGATIVE Final    Comment: (NOTE) If result is NEGATIVE SARS-CoV-2 target nucleic acids are NOT DETECTED. The SARS-CoV-2 RNA is generally detectable in upper and lower  respiratory specimens during the acute phase of infection. The lowest  concentration of SARS-CoV-2 viral copies this assay can detect is 250  copies / mL. A negative result does not preclude SARS-CoV-2 infection  and should not be used as the sole basis for treatment or other  patient management decisions.  A negative result may occur with  improper specimen collection / handling, submission of specimen other  than nasopharyngeal swab, presence of viral mutation(s) within the  areas targeted by this assay, and inadequate number of viral copies  (<250 copies / mL). A negative result must be combined with clinical  observations, patient history, and epidemiological information. If result is POSITIVE SARS-CoV-2 target nucleic acids are DETECTED. The SARS-CoV-2 RNA is generally detectable in upper and lower  respiratory specimens dur ing the acute phase of infection.  Positive  results are indicative of active infection with  SARS-CoV-2.  Clinical  correlation with patient history and other diagnostic information is  necessary to determine patient infection status.  Positive results do  not rule out bacterial infection or co-infection with other viruses. If result is PRESUMPTIVE POSTIVE SARS-CoV-2 nucleic acids MAY BE PRESENT.   A presumptive positive result was obtained on the submitted specimen  and confirmed on repeat testing.  While 2019 novel coronavirus  (SARS-CoV-2) nucleic acids may be present in the submitted sample  additional confirmatory testing may be necessary for epidemiological  and / or clinical management purposes  to differentiate between  SARS-CoV-2 and other Sarbecovirus currently known to infect humans.  If clinically indicated additional testing with an alternate test  methodology [id number]) is advised. The SARS-CoV-2 RNA is generally  detectable in upper and lower respiratory sp ecimens during the acute  phase of infection. The expected result is Negative. Fact Sheet for Patients:  StrictlyIdeas.no Fact Sheet for Healthcare Providers: BankingDealers.co.za This test is not yet approved or cleared by the [place] FDA and has been authorized for detection and/or diagnosis of SARS-CoV-2 by FDA under an Emergency Use Authorization (EUA).  This EUA will remain in effect (meaning this test can be used) for the duration of the COVID-19 declaration under Section 564(b)(1) of the Act, 21 U.S.C. section 360bbb-3(b)(1), unless the authorization is terminated or revoked sooner. Performed at [facility], [address] [place], [place] [address]   Surgical pcr screen     Status: None   Collection Time: 04/22/19  7:33 AM  Result Value Ref Range Status   MRSA, PCR NEGATIVE NEGATIVE Final   Staphylococcus aureus NEGATIVE NEGATIVE Final    Comment: (NOTE) The Xpert SA Assay (FDA approved for NASAL specimens in patients 80 years of age and  older), is one component of a comprehensive surveillance program. It is not intended to diagnose infection nor to guide or monitor treatment. Performed at [facility], [address], [place], [place] [address]     Studies: No results found.  Scheduled Meds: . Chlorhexidine Gluconate Cloth  6 each Topical Q0600  . enoxaparin (LOVENOX) injection  40 mg Subcutaneous Q24H  . insulin aspart  0-15 Units Subcutaneous TID WC  . pantoprazole  40 mg Oral Q1200  . sodium zirconium cyclosilicate  10 g Oral TID   Continuous Infusions: . sodium chloride 150 mL/hr at 04/24/19 0755  . ertapenem [facility]) IV     Principal Problem:   Rectal bleeding Active Problems:   Diabetes mellitus (HCC)   CKD (chronic kidney disease), stage III (HCC)   Anemia   History of cholecystitis   Calculus of gallbladder with cholecystitis without biliary obstruction   Choloenteric fistula  Critical Care Time spent: 33 minutes   Irwin Brakeman, MD [facility] 04/24/2019, 10:13 AM    LOS: 4 days  How to contact the [facility] Attending or Consulting provider [place] or covering provider during after hours [place], for this patient?  1. Check the care team in [facility] and look for a) attending/consulting TRH provider listed and b) the [facility] team listed 2. Log into [email or web address] and use [place]'s universal password to access. If you do not have the password, please contact the hospital operator. 3. Locate the [facility] provider you are looking for under [facility] and page to a number that you can be directly reached. 4. If you still have difficulty reaching the provider, please page the [facility] (Director on Call) for the Hospitalists listed on amion for assistance.

## 2019-04-24 NOTE — Evaluation (Signed)
Physical Therapy Evaluation Patient Details Name: Stephen RANSFORD Sr. MRN: [id number] DOB: 1941/07/22 Today's Date: 04/24/2019   History of Present Illness  Stephen Rogers  is a 78 y.o. male s/p Open cholecystectomy, partial colectomy, 04/23/19 with hx of hypertension, hyperlipidemia, Dm2 with CKD stage3, DVT in 2017 after sepsis/cholecystitis w perc cholecystostomy, h/o prior colonoscopy 2011=> diverticulosis, cecal adenoma, apparently c/o painless rectal bleeding x3. Slight abdominal discomfort around umbilicus earlier in the day but none presently.  Pt notes nausea.  Pt notes takes aspirin, but denies NSAIDS or blood thinners.  Pt denies dizziness,  Cp, palp, sob, emesis, diarrhea, black stool, dysuria.      Clinical Impression  Patient demonstrates labored movement for sitting up at bedside requiring use of bed rail and assistance, has to lean on nearby objects for support when completing transfers without AD, required use of RW for safety, ambulated up to doorway and back to bedside, limited secondary to fatigue and unable to full extend trunk due to abdominal discomfort, but patient states he is normally unable to fully extend trunk when walking.  Patient tolerated sitting up in chair after therapy.  Patient will benefit from continued physical therapy in hospital and recommended venue below to increase strength, balance, endurance for safe ADLs and gait.    Follow Up Recommendations Home health PT;Supervision for mobility/OOB;Supervision - Intermittent    Equipment Recommendations  None recommended by PT    Recommendations for Other Services       Precautions / Restrictions Precautions Precautions: Fall Restrictions Weight Bearing Restrictions: No      Mobility  Bed Mobility Overal bed mobility: Needs Assistance Bed Mobility: Supine to Sit     Supine to sit: Min assist     General bed mobility comments: slow labored movement requiring use of bedrail   Transfers Overall  transfer level: Needs assistance Equipment used: Rolling walker (2 wheeled) Transfers: Sit to/from [facility] Sit to Stand: Min assist Stand pivot transfers: Min assist       General transfer comment: has to lean on nearby objects for support when not using AD, required use of RW for safety  Ambulation/Gait Ambulation/Gait assistance: Min guard;Min assist Gait Distance (Feet): 22 Feet Assistive device: Rolling walker (2 wheeled) Gait Pattern/deviations: Decreased step length - right;Decreased step length - left;Decreased stride length Gait velocity: decreased   General Gait Details: slow labored cadence with kyphotic trunk, unable to fully extend trunk which is baseline per patient, limited for ambulation due to c/o fatigue  Stairs            Wheelchair Mobility    Modified Rankin (Stroke Patients Only)       Balance Overall balance assessment: Needs assistance Sitting-balance support: Feet supported;No upper extremity supported Sitting balance-Leahy Scale: Fair     Standing balance support: No upper extremity supported;During functional activity Standing balance-Leahy Scale: Poor Standing balance comment: fair using RW                             Pertinent Vitals/Pain Pain Assessment: 0-10 Pain Score: 5  Pain Location: bilateral shoulders and right side of abdomen at surgical site with movement, no pain at rest Pain Descriptors / Indicators: Sore;Discomfort Pain Intervention(s): Limited activity within patient's tolerance;Monitored during session    Home Living Family/patient expects to be discharged to:: Private residence Living Arrangements: Children Available Help at Discharge: Family;Available 24 hours/day Type of Home: House Home Access: Level entry  Home Layout: Two level;Able to live on main level with bedroom/bathroom Home Equipment: Gilford Rile - 2 wheels;Cane - single point;Bedside commode;Shower seat      Prior  Function Level of Independence: Needs assistance   Gait / Transfers Assistance Needed: Household ambulator without AD  ADL's / Homemaking Assistance Needed: assisted by family for community ADL's        Hand Dominance        Extremity/Trunk Assessment   Upper Extremity Assessment Upper Extremity Assessment: Generalized weakness    Lower Extremity Assessment Lower Extremity Assessment: Generalized weakness    Cervical / Trunk Assessment Cervical / Trunk Assessment: Kyphotic  Communication   Communication: No difficulties  Cognition Arousal/Alertness: Awake/alert Behavior During Therapy: WFL for tasks assessed/performed Overall Cognitive Status: Within Functional Limits for tasks assessed                                        General Comments      Exercises     Assessment/Plan    PT Assessment Patient needs continued PT services  PT Problem List Decreased strength;Decreased activity tolerance;Decreased balance;Decreased mobility       PT Treatment Interventions Therapeutic exercise;Gait training;Stair training;Functional mobility training;Therapeutic activities;Patient/family education    PT Goals (Current goals can be found in the Care Plan section)  Acute Rehab PT Goals Patient Stated Goal: return home with family to assist PT Goal Formulation: With patient Time For Goal Achievement: 05/01/19 Potential to Achieve Goals: Good    Frequency Min 3X/week   Barriers to discharge        Co-evaluation               AM-PAC PT "6 Clicks" Mobility  Outcome Measure Help needed turning from your back to your side while in a flat bed without using bedrails?: A Little Help needed moving from lying on your back to sitting on the side of a flat bed without using bedrails?: A Little Help needed moving to and from a bed to a chair (including a wheelchair)?: A Little Help needed standing up from a chair using your arms (e.g., wheelchair or  bedside chair)?: A Little Help needed to walk in hospital room?: A Little Help needed climbing 3-5 steps with a railing? : A Lot 6 Click Score: 17    End of Session   Activity Tolerance: Patient tolerated treatment well;Patient limited by fatigue Patient left: in chair;with call bell/phone within reach Nurse Communication: Mobility status PT Visit Diagnosis: Unsteadiness on feet (R26.81);Other abnormalities of gait and mobility (R26.89);Muscle weakness (generalized) (M62.81)    Time: 1610-9604 PT Time Calculation (min) (ACUTE ONLY): 33 min   Charges:   PT Evaluation $PT Eval Moderate Complexity: 1 Mod PT Treatments $Therapeutic Activity: 23-37 mins        12:36 PM, 04/24/19 Lonell Grandchild, MPT Physical Therapist with [facility] [phone] office [phone] mobile phone

## 2019-04-25 ENCOUNTER — Encounter: Payer: Self-pay | Admitting: Internal Medicine

## 2019-04-25 LAB — COMPREHENSIVE METABOLIC PANEL
ALT: 36 U/L (ref 0–44)
AST: 32 U/L (ref 15–41)
Albumin: 2.5 g/dL — ABNORMAL LOW (ref 3.5–5.0)
Alkaline Phosphatase: 73 U/L (ref 38–126)
Anion gap: 7 (ref 5–15)
BUN: 17 mg/dL (ref 8–23)
CO2: 22 mmol/L (ref 22–32)
Calcium: 7.1 mg/dL — ABNORMAL LOW (ref 8.9–10.3)
Chloride: 108 mmol/L (ref 98–111)
Creatinine, Ser: 1.3 mg/dL — ABNORMAL HIGH (ref 0.61–1.24)
GFR calc Af Amer: >60 mL/min (ref >60)
GFR calc non Af Amer: 52 mL/min — ABNORMAL LOW (ref >60)
Glucose, Bld: 138 mg/dL — ABNORMAL HIGH (ref 70–99)
Potassium: 4.5 mmol/L (ref 3.5–5.1)
Sodium: 137 mmol/L (ref 135–145)
Total Bilirubin: 0.6 mg/dL (ref 0.3–1.2)
Total Protein: 5.2 g/dL — ABNORMAL LOW (ref 6.5–8.1)

## 2019-04-25 LAB — CBC
HCT: 23.1 % — ABNORMAL LOW (ref 39.0–52.0)
Hemoglobin: 7.3 g/dL — ABNORMAL LOW (ref 13.0–17.0)
MCH: 29.8 pg (ref 26.0–34.0)
MCHC: 31.6 g/dL (ref 30.0–36.0)
MCV: 94.3 fL (ref 80.0–100.0)
Platelets: 207 10*3/uL (ref 150–400)
RBC: 2.45 MIL/uL — ABNORMAL LOW (ref 4.22–5.81)
RDW: 14.2 % (ref 11.5–15.5)
WBC: 12.2 10*3/uL — ABNORMAL HIGH (ref 4.0–10.5)
nRBC: 0 % (ref 0.0–0.2)

## 2019-04-25 LAB — GLUCOSE, CAPILLARY
Glucose-Capillary: 126 mg/dL — ABNORMAL HIGH (ref 70–99)
Glucose-Capillary: 122 mg/dL — ABNORMAL HIGH (ref 70–99)
Glucose-Capillary: 137 mg/dL — ABNORMAL HIGH (ref 70–99)
Glucose-Capillary: 152 mg/dL — ABNORMAL HIGH (ref 70–99)

## 2019-04-25 LAB — PHOSPHORUS: Phosphorus: 2.5 mg/dL (ref 2.5–4.6)

## 2019-04-25 LAB — MAGNESIUM: Magnesium: 1.7 mg/dL (ref 1.7–2.4)

## 2019-04-25 MED ORDER — METOPROLOL TARTRATE 5 MG/5ML IV SOLN: 5.0000 mg | Freq: Four times a day (QID) | INTRAVENOUS | Status: DC | PRN

## 2019-04-25 MED ORDER — VERAPAMIL HCL ER 240 MG PO TBCR
240.0000 mg | EXTENDED_RELEASE_TABLET | Freq: Every day | ORAL | Status: DC
  Administered 2019-04-25 – 2019-04-28 (×4): 240 mg via ORAL
  Filled 2019-04-25 (×4): qty 1

## 2019-04-25 MED ORDER — METOPROLOL TARTRATE 5 MG/5ML IV SOLN
5.0000 mg | Freq: Once | INTRAVENOUS | Status: AC
  Administered 2019-04-25: 01:00:00 5 mg via INTRAVENOUS
  Filled 2019-04-25: qty 5

## 2019-04-25 MED ORDER — SODIUM CHLORIDE 0.9 % IV BOLUS
250.0000 mL | Freq: Once | INTRAVENOUS | Status: AC
  Administered 2019-04-25: 250 mL via INTRAVENOUS

## 2019-04-25 NOTE — Progress Notes (Signed)
2 Days Post-Op  Subjective: No complaints.  Minimal incisional pain.  Objective: Vital signs in last 24 hours: Temp:  [98.4 F (36.9 C)-99.9 F (37.7 C)] 99.9 F (37.7 C) (06/03 0400) Pulse Rate:  [97-126] 106 (06/03 0815) Resp:  [16-26] 20 (06/03 0815) BP: (95-150)/(38-76) 108/57 (06/03 0800) SpO2:  [80 %-100 %] 99 % (06/03 0815) Weight:  [98.1 kg] 98.1 kg (06/03 0500) Last BM Date: 04/18/19  Intake/Output from previous day: 06/02 0701 - 06/03 0700 In: 3901.1 [P.O.:220; I.V.:3631.1; IV Piggyback:50] Out: 2700 [Urine:2700] Intake/Output this shift: Total I/O In: 783.1 [I.V.:783.1] Out: 325 [Urine:325]  General appearance: alert, cooperative and no distress Resp: clear to auscultation bilaterally Cardio: Slight sinus tachycardia with regular rhythm. GI: Soft with minimal bowel sounds appreciated.  Incision healing well.  Lab Results:  Recent Labs    04/24/19 0435 04/25/19 0414  WBC 12.2* 12.2*  HGB 8.9* 7.3*  HCT 28.7* 23.1*  PLT 240 207   BMET Recent Labs    04/24/19 0435 04/25/19 0414  NA 136 137  K 5.4* 4.5  CL 106 108  CO2 20* 22  GLUCOSE 227* 138*  BUN 19 17  CREATININE 1.48* 1.30*  CALCIUM 7.4* 7.1*   PT/INR No results for input(s): LABPROT, INR in the last 72 hours.  Studies/Results: No results found.  Anti-infectives: Anti-infectives (From admission, onward)   Start     Dose/Rate Route Frequency Ordered Stop   04/24/19 1030  ertapenem (INVANZ) 1,000 mg in sodium chloride 0.9 % 50 mL IVPB     1 g 100 mL/hr over 30 Minutes Intravenous Every 24 hours 04/23/19 1426     04/23/19 1000  ertapenem (INVANZ) 1,000 mg in sodium chloride 0.9 % 100 mL IVPB     1 g 200 mL/hr over 30 Minutes Intravenous  Once 04/23/19 0930 04/23/19 1345   04/22/19 1030  ertapenem (INVANZ) 1,000 mg in sodium chloride 0.9 % 100 mL IVPB     1 g 200 mL/hr over 30 Minutes Intravenous On call to O.R. 04/22/19 1021 04/23/19 0559      Assessment/Plan: s/p  Procedure(s): CHOLECYSTECTOMY PARTIAL COLECTOMY Impression: Stable on postoperative day 2.  Mild hyperkalemia resolved.  Suspect drop in hemoglobin secondary to rehydration and fluid boluses.  Will advance to full liquid diet.  Adjust IV fluids accordingly.  Okay for transfer to regular floor from surgical standpoint.  LOS: 5 days    [facility] Signs 04/25/2019

## 2019-04-25 NOTE — TOC Progression Note (Signed)
Transition of Care (TOC) - Progression Note    Patient Details  Name: Stephen DOWE Sr. MRN: [id number] Date of Birth: August 16, 1941  Transition of Care [facility]) CM/SW Contact  Shade Flood, LCSW Phone Number: 04/25/2019, 11:27 AM  Clinical Narrative:     TOC following. PT recommending HH PT at dc. Met with pt today to discuss. Pt agreeable. He requests referral to [facility]. Referral made today. TOC will update Tim at [facility] when pt has known dc date. MD anticipating several more days of hospital care as pt has not had return of bowel function since surgery.   TOC will follow.  Expected Discharge Plan: [place] Barriers to Discharge: Continued Medical Work up  Expected Discharge Plan and Services Expected Discharge Plan: [place] In-house Referral: Clinical Social Work Discharge Planning Services: CM Consult Post Acute Care Choice: Man arrangements for the past 2 months: [place]: RN, PT [place] Agency: [facility] (formerly [employer]) Date [place]: 04/25/19 Time [place]: [place] Representative spoke with at [place]: [place] ([place]) Interventions    Readmission Risk Interventions No flowsheet data found.

## 2019-04-25 NOTE — Progress Notes (Signed)
PT Cancellation Note  Patient Details Name: Stephen SHERROW Sr. MRN: [id number] DOB: 03/21/1941   Cancelled Treatment:    Reason Eval/Treat Not Completed: Pain limiting ability to participate.  Patient declined therapy in AM secondary to c/o pain - RN notified and patient given pain medication.  Later in PM declined due fatigue after being assisted by nursing staff to chair - RN aware.   4:00 PM, 04/25/19 Lonell Grandchild, MPT Physical Therapist with [facility] [phone] office [phone] mobile phone

## 2019-04-25 NOTE — Progress Notes (Signed)
Pts HR sustaining to 120's, BP-122/66, denies pain. Lamar Blinks, NP paged to see if Metopropol could be ordered x1.  Pt placed on 2L O2 per [place] d/t oxygen 90% on RA. Waiting for call back/orders. Will continue to monitor pt

## 2019-04-25 NOTE — Progress Notes (Signed)
Metoprolol 5mg  IV given & 218mL bolus per MD order d/t increased HR. Will continue to monitor pt.

## 2019-04-25 NOTE — Progress Notes (Signed)
PROGRESS NOTE    Stephen GINTZ Sr.  HYW:[id number] DOB: 07/18/1941 DOA: 04/18/2019 PCP: [name], MD   Brief Narrative:  78 y.o. male admitted with painless rectal bleeding. 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  Assessment & Plan:   Principal Problem:   Rectal bleeding Active Problems:   Diabetes mellitus (HCC)   CKD (chronic kidney disease), stage III (HCC)   Anemia   History of cholecystitis   Calculus of gallbladder with cholecystitis without biliary obstruction   Choloenteric fistula 1. Postop day #2 s/p open cholecystectomy and partial colectomy  - Pt had colonscopy done 5/30 with findings of abnormal area of colon at hepatic flexure.  He had a CT abdomen with findings of a fistula involving the gallbladder and colon.  Surgery was consulted.  He was taken to OR on 6/1.  He is recovering nicely in the stepdown ICU.  He is tolerating diet and pain well controlled.      Advance per general surgery, but no flatus or bowel movement noted yet. 2. 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  Pt now on clear liquid diet and tolerating diet well.  Advance diet per general surgery. 3. Hyperkalemia - ordered lokelma x 2 doses ordered on 6/2.  Currently improved and will follow repeat BMP. 4. Mild sinus tachycardia-asymptomatic.  Restart home verapamil to start at bedtime.  Metoprolol IV to be ordered as needed in the meantime.  Okay to transfer to telemetry floor. 5. Acute blood loss anemia -  Hg set 7.3. Follow with repeat CBC in a.m. and transfuse if hemoglobin less than 7.  Hold home aspirin for now. 6. Leukocytosis - stable post op, recheck CBC in AM.   No significant fever noted. 7. Dyslipidemia.  Hold statin for now. 8. Hypertension.  Restarted verapamil and will hold spironolactone, lisinopril, and hydralazine until blood pressures improved. Type 2 DM - continue sliding scale coverage  and CBG testing.  Improved control noted.  Hold metformin and glipizide for now.     DVT prophylaxis:Lovenox Code Status: Full Family Communication: Will plan to update daughter Disposition Plan: Advance diet per general surgery and continue to monitor labs and electrolytes with improving trend.  Transfer to telemetry floor and restart home verapamil at bedtime.   Consultants:   GI  GS  Procedures:  Impression: - One 4 mm polyp at the recto-sigmoid colon,  removed with a cold snare. Resected and retrieved.  Colonic lipoma. Markedly abnormal area of colon at the hepatic  flexure of uncertain etiology. (Query fistula,  atypical presentation for infiltrating tumor or  possibly related to previously placed  cholecystostomy tube). This area is suspicious for  site of bleeding.  6/1- open cholecystectomy and partial colectomy  Antimicrobials:   Invanz 6/1->   Subjective: Patient seen and evaluated today with no new acute complaints or concerns. No acute concerns or events noted overnight.  He was noted to have some elevated heart rate overnight for which she was given IV metoprolol.  Heart rate remains in the low 100 range and blood pressures are stable.  No flatus or BM noted overnight.  PT has evaluated patient and recommends home health physical therapy.  Objective: Vitals:   04/25/19 0300 04/25/19 0400 04/25/19 0500 04/25/19 0600  BP: 126/60 120/70 130/68 120/67  Pulse: (!) 103 (!) 102 (!) 105 (!) 102  Resp: (!) 22 (!) 23 20 17   Temp:  99.9 F (37.7 C)    TempSrc:  Oral    SpO2: 100% 98% 98% 99%  Weight:   98.1 kg   Height:        Intake/Output Summary (Last 24 hours) at 04/25/2019 0658 Last data filed at 04/25/2019 0516 Gross per 24 hour  Intake 3901.09 ml   Output 2700 ml  Net 1201.09 ml   Filed Weights   04/23/19 1335 04/24/19 0500 04/25/19 0500  Weight: 90.7 kg 94.4 kg 98.1 kg    Examination:  General exam: Appears calm and comfortable  Respiratory system: Clear to auscultation. Respiratory effort normal.  Currently on 2.5 L nasal cannula. Cardiovascular system: S1 & S2 heard, RRR-mildly tachycardic. No JVD, murmurs, rubs, gallops or clicks. No pedal edema. Gastrointestinal system: Abdomen is nondistended, soft and nontender. No organomegaly or masses felt. Normal bowel sounds heard.  Abdominal incision clean dry and intact. Central nervous system: Alert and oriented. No focal neurological deficits. Extremities: Symmetric 5 x 5 power. Skin: No rashes, lesions or ulcers Psychiatry: Judgement and insight appear normal. Mood & affect appropriate.     Data Reviewed: I have personally reviewed following labs and imaging studies  CBC: Recent Labs  Lab 04/18/19 1545  04/21/19 0617 04/22/19 0458 04/23/19 1510 04/24/19 0435 04/25/19 0414  WBC 13.5*   < > 5.5 7.2 7.5 12.2* 12.2*  NEUTROABS 8.7*  --   --   --   --   --   --   HGB 11.1*   < > 8.7* 9.4* 10.1* 8.9* 7.3*  HCT 36.2*   < > 27.7* 29.6* 32.7* 28.7* 23.1*  MCV 96.5   < > 93.6 93.7 95.9 95.7 94.3  PLT 288   < > 229 293 287 240 207   < > = values in this interval not displayed.   Basic Metabolic Panel: Recent Labs  Lab 04/19/19 0424 04/20/19 0855 04/21/19 0617 04/23/19 1510 04/24/19 0435 04/25/19 0414  NA 143 138 140  --  136 137  K 4.0 4.2 4.0  --  5.4* 4.5  CL 108 106 106  --  106 108  CO2 27 23 26   --  20* 22  GLUCOSE 143* 147* 142*  --  227* 138*  BUN 20 12 9   --  19 17  CREATININE 1.18 1.15 1.13 1.63* 1.48* 1.30*  CALCIUM 8.1* 8.2* 8.6*  --  7.4* 7.1*  MG  --   --   --   --  1.5* 1.7  PHOS  --   --   --   --  3.4 2.5   GFR: Estimated Creatinine Clearance: 50.5 mL/min (A) (by C-G formula based on SCr of 1.3 mg/dL (H)). Liver Function Tests: Recent Labs   Lab 04/19/19 0424 04/20/19 0855 04/21/19 0617 04/24/19 0435 04/25/19 0414  AST 22 51* 36 42* 32  ALT 25 71* 55* 47* 36  ALKPHOS 94 109 110 81 73  BILITOT 0.7 0.5 0.6 0.4 0.6  PROT 5.7* 6.1* 6.2* 5.6* 5.2*  ALBUMIN 3.1* 3.3* 3.3* 2.8* 2.5*   No results for input(s): LIPASE, AMYLASE in the last 168 hours. No results for input(s): AMMONIA in the last 168 hours. Coagulation Profile: Recent Labs  Lab 04/18/19 1545  INR 1.0   Cardiac Enzymes: No results for input(s): CKTOTAL, CKMB, CKMBINDEX, TROPONINI in the last 168 hours. BNP (last 3 results) No results for input(s): PROBNP in the last 8760 hours. HbA1C: No results for input(s): HGBA1C in the last 72 hours. CBG: Recent  Labs  Lab 04/23/19 2048 04/24/19 0733 04/24/19 1109 04/24/19 1622 04/24/19 2139  GLUCAP 219* 203* 173* 163* 139*   Lipid Profile: No results for input(s): CHOL, HDL, LDLCALC, TRIG, CHOLHDL, LDLDIRECT in the last 72 hours. Thyroid Function Tests: No results for input(s): TSH, T4TOTAL, FREET4, T3FREE, THYROIDAB in the last 72 hours. Anemia Panel: No results for input(s): VITAMINB12, FOLATE, FERRITIN, TIBC, IRON, RETICCTPCT in the last 72 hours. Sepsis Labs: No results for input(s): PROCALCITON, LATICACIDVEN in the last 168 hours.  Recent Results (from the past 240 hour(s))  SARS Coronavirus 2 (CEPHEID - Performed in [place] hospital lab), Hosp Order     Status: None   Collection Time: 04/18/19  5:08 PM  Result Value Ref Range Status   SARS Coronavirus 2 NEGATIVE NEGATIVE Final    Comment: (NOTE) If result is NEGATIVE SARS-CoV-2 target nucleic acids are NOT DETECTED. The SARS-CoV-2 RNA is generally detectable in upper and lower  respiratory specimens during the acute phase of infection. The lowest  concentration of SARS-CoV-2 viral copies this assay can detect is 250  copies / mL. A negative result does not preclude SARS-CoV-2 infection  and should not be used as the sole basis for treatment or  other  patient management decisions.  A negative result may occur with  improper specimen collection / handling, submission of specimen other  than nasopharyngeal swab, presence of viral mutation(s) within the  areas targeted by this assay, and inadequate number of viral copies  (<250 copies / mL). A negative result must be combined with clinical  observations, patient history, and epidemiological information. If result is POSITIVE SARS-CoV-2 target nucleic acids are DETECTED. The SARS-CoV-2 RNA is generally detectable in upper and lower  respiratory specimens dur ing the acute phase of infection.  Positive  results are indicative of active infection with SARS-CoV-2.  Clinical  correlation with patient history and other diagnostic information is  necessary to determine patient infection status.  Positive results do  not rule out bacterial infection or co-infection with other viruses. If result is PRESUMPTIVE POSTIVE SARS-CoV-2 nucleic acids MAY BE PRESENT.   A presumptive positive result was obtained on the submitted specimen  and confirmed on repeat testing.  While 2019 novel coronavirus  (SARS-CoV-2) nucleic acids may be present in the submitted sample  additional confirmatory testing may be necessary for epidemiological  and / or clinical management purposes  to differentiate between  SARS-CoV-2 and other Sarbecovirus currently known to infect humans.  If clinically indicated additional testing with an alternate test  methodology [phone]) is advised. The SARS-CoV-2 RNA is generally  detectable in upper and lower respiratory sp ecimens during the acute  phase of infection. The expected result is Negative. Fact Sheet for Patients:  StrictlyIdeas.no Fact Sheet for Healthcare Providers: BankingDealers.co.za This test is not yet approved or cleared by the [place] FDA and has been authorized for detection and/or diagnosis of  SARS-CoV-2 by FDA under an Emergency Use Authorization (EUA).  This EUA will remain in effect (meaning this test can be used) for the duration of the COVID-19 declaration under Section 564(b)(1) of the Act, 21 U.S.C. section 360bbb-3(b)(1), unless the authorization is terminated or revoked sooner. Performed at [facility], [address], [place], [place] [address]   Surgical pcr screen     Status: None   Collection Time: 04/22/19  7:33 AM  Result Value Ref Range Status   MRSA, PCR NEGATIVE NEGATIVE Final   Staphylococcus aureus NEGATIVE NEGATIVE Final  Comment: (NOTE) The Xpert SA Assay (FDA approved for NASAL specimens in patients 57 years of age and older), is one component of a comprehensive surveillance program. It is not intended to diagnose infection nor to guide or monitor treatment. Performed at [facility], [address], [place], [place] [address]          Radiology Studies: No results found.      Scheduled Meds: . Chlorhexidine Gluconate Cloth  6 each Topical Q0600  . enoxaparin (LOVENOX) injection  40 mg Subcutaneous Q24H  . insulin aspart  0-15 Units Subcutaneous TID WC  . pantoprazole  40 mg Oral Q1200   Continuous Infusions: . sodium chloride 150 mL/hr at 04/25/19 0511  . ertapenem [facility]) IV 1,000 mg (04/24/19 1110)     LOS: 5 days    Time spent: 30 minutes    Stephen Shackett Darleen Crocker, DO [facility] Pager [phone]  If 7PM-7AM, please contact night-coverage [email or web address] Password [facility] 04/25/2019, 6:58 AM

## 2019-04-25 NOTE — Care Management Important Message (Signed)
Important Message  Patient Details  Name: Stephen SHIPES Sr. MRN: [id number] Date of Birth: [birth year]   Medicare Important Message Given:  Yes    Tommy Medal 04/25/2019, 2:14 PM

## 2019-04-26 LAB — COMPREHENSIVE METABOLIC PANEL
ALT: 26 U/L (ref 0–44)
AST: 25 U/L (ref 15–41)
Albumin: 2.4 g/dL — ABNORMAL LOW (ref 3.5–5.0)
Alkaline Phosphatase: 83 U/L (ref 38–126)
Anion gap: 7 (ref 5–15)
BUN: 14 mg/dL (ref 8–23)
CO2: 23 mmol/L (ref 22–32)
Calcium: 7.2 mg/dL — ABNORMAL LOW (ref 8.9–10.3)
Chloride: 107 mmol/L (ref 98–111)
Creatinine, Ser: 1.24 mg/dL (ref 0.61–1.24)
GFR calc Af Amer: >60 mL/min (ref >60)
GFR calc non Af Amer: 55 mL/min — ABNORMAL LOW (ref >60)
Glucose, Bld: 152 mg/dL — ABNORMAL HIGH (ref 70–99)
Potassium: 3.9 mmol/L (ref 3.5–5.1)
Sodium: 137 mmol/L (ref 135–145)
Total Bilirubin: 0.6 mg/dL (ref 0.3–1.2)
Total Protein: 5.4 g/dL — ABNORMAL LOW (ref 6.5–8.1)

## 2019-04-26 LAB — TYPE AND SCREEN
ABO/RH(D): B POS
Antibody Screen: POSITIVE
Unit division: 0

## 2019-04-26 LAB — CBC
HCT: 23 % — ABNORMAL LOW (ref 39.0–52.0)
Hemoglobin: 7.1 g/dL — ABNORMAL LOW (ref 13.0–17.0)
MCH: 29.1 pg (ref 26.0–34.0)
MCHC: 30.9 g/dL (ref 30.0–36.0)
MCV: 94.3 fL (ref 80.0–100.0)
Platelets: 218 10*3/uL (ref 150–400)
RBC: 2.44 MIL/uL — ABNORMAL LOW (ref 4.22–5.81)
RDW: 14.3 % (ref 11.5–15.5)
WBC: 12.8 10*3/uL — ABNORMAL HIGH (ref 4.0–10.5)
nRBC: 0 % (ref 0.0–0.2)

## 2019-04-26 LAB — MAGNESIUM: Magnesium: 1.9 mg/dL (ref 1.7–2.4)

## 2019-04-26 LAB — GLUCOSE, CAPILLARY
Glucose-Capillary: 131 mg/dL — ABNORMAL HIGH (ref 70–99)
Glucose-Capillary: 193 mg/dL — ABNORMAL HIGH (ref 70–99)
Glucose-Capillary: 130 mg/dL — ABNORMAL HIGH (ref 70–99)
Glucose-Capillary: 132 mg/dL — ABNORMAL HIGH (ref 70–99)

## 2019-04-26 MED ORDER — MAGNESIUM OXIDE 400 (241.3 MG) MG PO TABS
400.0000 mg | ORAL_TABLET | Freq: Two times a day (BID) | ORAL | Status: DC
  Administered 2019-04-26 – 2019-04-29 (×7): 400 mg via ORAL
  Filled 2019-04-26 (×7): qty 1

## 2019-04-26 NOTE — Progress Notes (Signed)
PROGRESS NOTE    Stephen GASCOIGNE Sr.  ZHY:[id number] DOB: 10/25/1941 DOA: 04/18/2019 PCP: [name], MD   Brief Narrative:  78 y.o. male admitted with painless rectal bleeding.Pt was found to have a cholecolo fistula and taken to OR 6/1 for open cholecystectomy and partial colectomy.  Assessment & Plan:   Principal Problem:   Rectal bleeding Active Problems:   Diabetes mellitus (HCC)   CKD (chronic kidney disease), stage III (HCC)   Anemia   History of cholecystitis   Calculus of gallbladder with cholecystitis without biliary obstruction   Choloenteric fistula   1. Postop day #3 s/p open cholecystectomy and partial colectomy- Pt had colonscopy done 5/30 with findings of abnormal area of colon at hepatic flexure. He had a CT abdomen with findings of a fistula involving the gallbladder and colon. Surgery was consulted. He was taken to OR on 6/1. He is recovering nicely in the stepdown ICU. He is tolerating diet and pain well controlled.   Advance per general surgery to regular with ambulation today.  He is now passing flatus and has had bowel movement. 2. Cholecolo fistula -s/p open chole and partial colectomy6/1/20. Pt now on clear liquid diet and tolerating diet well.  Advance diet per general surgery. 3. Hyperkalemia - ordered lokelma x 2 doses ordered on 6/2.  Currently improved and will follow repeat BMP. 4. Mild sinus tachycardia-asymptomatic.  Restart home verapamil to start at bedtime.  Metoprolol IV to be ordered as needed in the meantime.  Okay to transfer to telemetry floor. 5. Acute blood loss anemia - Hg set 7.1. Follow with repeat CBC in a.m. and transfuse if hemoglobin less than 7.  Hold home aspirin for now.  Recheck a.m. CBC. 6. Leukocytosis -stable post op, recheck CBC in AM.  No significant fever noted. 7. Dyslipidemia.  Hold statin for now. 8. Hypertension.  Restarted verapamil and will hold spironolactone, lisinopril, and hydralazine until blood  pressures improved. Type 2 DM - continue sliding scale coverage and CBG testing.  Improved control noted.  Hold metformin and glipizide for now.   DVT prophylaxis:Lovenox Code Status: Full Family Communication: Will plan to update daughter Disposition Plan: Advance diet per general surgery and ambulate today.  Recheck labs in a.m. and anticipate discharge in a.m. if stable.  PT evaluation pending today.   Consultants:   GI  GS  Procedures:  Impression: - One 4 mm polyp at the recto-sigmoid colon,  removed with a cold snare. Resected and retrieved.  Colonic lipoma. Markedly abnormal area of colon at the hepatic  flexure of uncertain etiology. (Query fistula,  atypical presentation for infiltrating tumor or  possibly related to previously placed  cholecystostomy tube). This area is suspicious for  site of bleeding.  6/1- open cholecystectomy and partial colectomy  Antimicrobials:  Anti-infectives (From admission, onward)   Start     Dose/Rate Route Frequency Ordered Stop   04/24/19 1030  ertapenem (INVANZ) 1,000 mg in sodium chloride 0.9 % 50 mL IVPB     1 g 100 mL/hr over 30 Minutes Intravenous Every 24 hours 04/23/19 1426     04/23/19 1000  ertapenem (INVANZ) 1,000 mg in sodium chloride 0.9 % 100 mL IVPB     1 g 200 mL/hr over 30 Minutes Intravenous  Once 04/23/19 0930 04/23/19 1345   04/22/19 1030  ertapenem (INVANZ) 1,000 mg in sodium chloride 0.9 % 100 mL IVPB     1 g 200 mL/hr over 30 Minutes Intravenous On call to O.R.  04/22/19 1021 04/23/19 0559       Subjective: Patient seen and evaluated today with no new acute complaints or concerns. No acute concerns or events noted overnight.  He appears to have had a bowel movement last night and this passing  flatus.  His abdomen is still distended with some discomfort noted, but he is tolerating full liquids.  Seen by general surgery this morning with diet advanced.  Patient will ambulate more today.  Objective: Vitals:   04/25/19 1839 04/25/19 2119 04/25/19 2122 04/26/19 0544  BP: 137/78 (!) 148/92 (!) 148/92 125/71  Pulse: (!) 112  (!) 118 (!) 108  Resp: 19  16 16   Temp: 99.2 F (37.3 C)  99.5 F (37.5 C) 99.3 F (37.4 C)  TempSrc: Oral  Oral Oral  SpO2: 90%  94% 91%  Weight:      Height:        Intake/Output Summary (Last 24 hours) at 04/26/2019 1018 Last data filed at 04/25/2019 1120 Gross per 24 hour  Intake 109.48 ml  Output --  Net 109.48 ml   Filed Weights   04/23/19 1335 04/24/19 0500 04/25/19 0500  Weight: 90.7 kg 94.4 kg 98.1 kg    Examination:  General exam: Appears calm and comfortable  Respiratory system: Clear to auscultation. Respiratory effort normal.  Currently on nasal cannula. Cardiovascular system: S1 & S2 heard, RRR. No JVD, murmurs, rubs, gallops or clicks. No pedal edema. Gastrointestinal system: Abdomen is minimally distended, soft and nontender. No organomegaly or masses felt.  Hypoactive bowel sounds. Central nervous system: Alert and oriented. No focal neurological deficits. Extremities: Symmetric 5 x 5 power. Skin: No rashes, lesions or ulcers Psychiatry: Judgement and insight appear normal. Mood & affect appropriate.     Data Reviewed: I have personally reviewed following labs and imaging studies  CBC: Recent Labs  Lab 04/22/19 0458 04/23/19 1510 04/24/19 0435 04/25/19 0414 04/26/19 0558  WBC 7.2 7.5 12.2* 12.2* 12.8*  HGB 9.4* 10.1* 8.9* 7.3* 7.1*  HCT 29.6* 32.7* 28.7* 23.1* 23.0*  MCV 93.7 95.9 95.7 94.3 94.3  PLT 293 287 240 207 793   Basic Metabolic Panel: Recent Labs  Lab 04/20/19 0855 04/21/19 0617 04/23/19 1510 04/24/19 0435 04/25/19 0414 04/26/19 0558  NA 138 140  --  136 137 137  K 4.2 4.0  --  5.4* 4.5 3.9  CL 106  106  --  106 108 107  CO2 23 26  --  20* 22 23  GLUCOSE 147* 142*  --  227* 138* 152*  BUN 12 9  --  19 17 14   CREATININE 1.15 1.13 1.63* 1.48* 1.30* 1.24  CALCIUM 8.2* 8.6*  --  7.4* 7.1* 7.2*  MG  --   --   --  1.5* 1.7 1.9  PHOS  --   --   --  3.4 2.5  --    GFR: Estimated Creatinine Clearance: 53 mL/min (by C-G formula based on SCr of 1.24 mg/dL). Liver Function Tests: Recent Labs  Lab 04/20/19 0855 04/21/19 0617 04/24/19 0435 04/25/19 0414 04/26/19 0558  AST 51* 36 42* 32 25  ALT 71* 55* 47* 36 26  ALKPHOS 109 110 81 73 83  BILITOT 0.5 0.6 0.4 0.6 0.6  PROT 6.1* 6.2* 5.6* 5.2* 5.4*  ALBUMIN 3.3* 3.3* 2.8* 2.5* 2.4*   No results for input(s): LIPASE, AMYLASE in the last 168 hours. No results for input(s): AMMONIA in the last 168 hours. Coagulation Profile: No results for input(s): INR, PROTIME  in the last 168 hours. Cardiac Enzymes: No results for input(s): CKTOTAL, CKMB, CKMBINDEX, TROPONINI in the last 168 hours. BNP (last 3 results) No results for input(s): PROBNP in the last 8760 hours. HbA1C: No results for input(s): HGBA1C in the last 72 hours. CBG: Recent Labs  Lab 04/25/19 0756 04/25/19 1116 04/25/19 1640 04/25/19 2300 04/26/19 0734  GLUCAP 126* 122* 137* 152* 131*   Lipid Profile: No results for input(s): CHOL, HDL, LDLCALC, TRIG, CHOLHDL, LDLDIRECT in the last 72 hours. Thyroid Function Tests: No results for input(s): TSH, T4TOTAL, FREET4, T3FREE, THYROIDAB in the last 72 hours. Anemia Panel: No results for input(s): VITAMINB12, FOLATE, FERRITIN, TIBC, IRON, RETICCTPCT in the last 72 hours. Sepsis Labs: No results for input(s): PROCALCITON, LATICACIDVEN in the last 168 hours.  Recent Results (from the past 240 hour(s))  SARS Coronavirus 2 (CEPHEID - Performed in [place] hospital lab), Hosp Order     Status: None   Collection Time: 04/18/19  5:08 PM  Result Value Ref Range Status   SARS Coronavirus 2 NEGATIVE NEGATIVE Final    Comment:  (NOTE) If result is NEGATIVE SARS-CoV-2 target nucleic acids are NOT DETECTED. The SARS-CoV-2 RNA is generally detectable in upper and lower  respiratory specimens during the acute phase of infection. The lowest  concentration of SARS-CoV-2 viral copies this assay can detect is 250  copies / mL. A negative result does not preclude SARS-CoV-2 infection  and should not be used as the sole basis for treatment or other  patient management decisions.  A negative result may occur with  improper specimen collection / handling, submission of specimen other  than nasopharyngeal swab, presence of viral mutation(s) within the  areas targeted by this assay, and inadequate number of viral copies  (<250 copies / mL). A negative result must be combined with clinical  observations, patient history, and epidemiological information. If result is POSITIVE SARS-CoV-2 target nucleic acids are DETECTED. The SARS-CoV-2 RNA is generally detectable in upper and lower  respiratory specimens dur ing the acute phase of infection.  Positive  results are indicative of active infection with SARS-CoV-2.  Clinical  correlation with patient history and other diagnostic information is  necessary to determine patient infection status.  Positive results do  not rule out bacterial infection or co-infection with other viruses. If result is PRESUMPTIVE POSTIVE SARS-CoV-2 nucleic acids MAY BE PRESENT.   A presumptive positive result was obtained on the submitted specimen  and confirmed on repeat testing.  While 2019 novel coronavirus  (SARS-CoV-2) nucleic acids may be present in the submitted sample  additional confirmatory testing may be necessary for epidemiological  and / or clinical management purposes  to differentiate between  SARS-CoV-2 and other Sarbecovirus currently known to infect humans.  If clinically indicated additional testing with an alternate test  methodology [phone]) is advised. The SARS-CoV-2 RNA is  generally  detectable in upper and lower respiratory sp ecimens during the acute  phase of infection. The expected result is Negative. Fact Sheet for Patients:  StrictlyIdeas.no Fact Sheet for Healthcare Providers: BankingDealers.co.za This test is not yet approved or cleared by the [place] FDA and has been authorized for detection and/or diagnosis of SARS-CoV-2 by FDA under an Emergency Use Authorization (EUA).  This EUA will remain in effect (meaning this test can be used) for the duration of the COVID-19 declaration under Section 564(b)(1) of the Act, 21 U.S.C. section 360bbb-3(b)(1), unless the authorization is terminated or revoked sooner. Performed at [facility],  [address], [place], [place] [address]   Surgical pcr screen     Status: None   Collection Time: 04/22/19  7:33 AM  Result Value Ref Range Status   MRSA, PCR NEGATIVE NEGATIVE Final   Staphylococcus aureus NEGATIVE NEGATIVE Final    Comment: (NOTE) The Xpert SA Assay (FDA approved for NASAL specimens in patients 47 years of age and older), is one component of a comprehensive surveillance program. It is not intended to diagnose infection nor to guide or monitor treatment. Performed at [facility], [address] [place], [place] [address]          Radiology Studies: No results found.      Scheduled Meds:  Chlorhexidine Gluconate Cloth  6 each Topical Q0600   enoxaparin (LOVENOX) injection  40 mg Subcutaneous Q24H   insulin aspart  0-15 Units Subcutaneous TID WC   pantoprazole  40 mg Oral Q1200   verapamil  240 mg Oral QHS   Continuous Infusions:  ertapenem (INVANZ) IV Stopped (04/25/19 1200)     LOS: 6 days    Time spent: 30 minutes    Laverne Klugh Darleen Crocker, DO [facility] Pager [phone]  If 7PM-7AM, please contact night-coverage [email or web address] Password [email or web address] 04/26/2019, 10:18 AM

## 2019-04-26 NOTE — Progress Notes (Signed)
3 Days Post-Op  Subjective: Patient complaining of some incisional pain.  Did have a bowel movement last night.  Objective: Vital signs in last 24 hours: Temp:  [98.9 F (37.2 C)-99.5 F (37.5 C)] 99.3 F (37.4 C) (06/04 0544) Pulse Rate:  [96-129] 108 (06/04 0544) Resp:  [14-24] 16 (06/04 0544) BP: (114-154)/(63-92) 125/71 (06/04 0544) SpO2:  [88 %-100 %] 91 % (06/04 0544) Last BM Date: 04/18/19  Intake/Output from previous day: 06/03 0701 - 06/04 0700 In: 1008.8 [I.V.:1008.8] Out: 325 [Urine:325] Intake/Output this shift: No intake/output data recorded.  General appearance: alert, cooperative and no distress GI: Soft, incision healing well.  Bowel sounds active.  Lab Results:  Recent Labs    04/25/19 0414 04/26/19 0558  WBC 12.2* 12.8*  HGB 7.3* 7.1*  HCT 23.1* 23.0*  PLT 207 218   BMET Recent Labs    04/25/19 0414 04/26/19 0558  NA 137 137  K 4.5 3.9  CL 108 107  CO2 22 23  GLUCOSE 138* 152*  BUN 17 14  CREATININE 1.30* 1.24  CALCIUM 7.1* 7.2*   PT/INR No results for input(s): LABPROT, INR in the last 72 hours.  Studies/Results: No results found.  Anti-infectives: Anti-infectives (From admission, onward)   Start     Dose/Rate Route Frequency Ordered Stop   04/24/19 1030  ertapenem (INVANZ) 1,000 mg in sodium chloride 0.9 % 50 mL IVPB     1 g 100 mL/hr over 30 Minutes Intravenous Every 24 hours 04/23/19 1426     04/23/19 1000  ertapenem (INVANZ) 1,000 mg in sodium chloride 0.9 % 100 mL IVPB     1 g 200 mL/hr over 30 Minutes Intravenous  Once 04/23/19 0930 04/23/19 1345   04/22/19 1030  ertapenem (INVANZ) 1,000 mg in sodium chloride 0.9 % 100 mL IVPB     1 g 200 mL/hr over 30 Minutes Intravenous On call to O.R. 04/22/19 1021 04/23/19 0559      Assessment/Plan: s/p Procedure(s): CHOLECYSTECTOMY PARTIAL COLECTOMY Impression: Bowel function has returned.  Will advance to heart healthy carb modified diet.  Hep-Lock IV.  Have encouraged patient  to ambulate.  LOS: 6 days    Aviva Signs 04/26/2019

## 2019-04-26 NOTE — TOC Progression Note (Signed)
Transition of Care (TOC) - Progression Note    Patient Details  Name: Stephen BRABSON Sr. MRN: [id number] Date of Birth: [birth year]  Transition of Care [facility]) CM/SW Contact  Ohm, Dentler, LCSW Phone Number: 04/26/2019, 2:01 PM  Clinical Narrative:    LCSW spoke with patient about PT today and his abilities during session. Discussed updated recommendation for SNF. Patient stated that he has previously been to SNF and was there for about a month. Most recently he has [place] with PT, nurse, aide active with [facility].  Patient indicated that he was not interested in SNF despite his difficulty during his session.  Patient plans on going home with HHPT resumed. Patient states that family in the home will assist him as needed.    Expected Discharge Plan: [place] Barriers to Discharge: Continued Medical Work up  Expected Discharge Plan and Services Expected Discharge Plan: [place] In-house Referral: Clinical Social Work Discharge Planning Services: CM Consult Post Acute Care Choice: [place] arrangements for the past 2 months: [place]: RN, PT [place] Agency: [facility] (formerly [employer]) Date [place]: 04/25/19 Time [place]: [place] Representative spoke with at [place]: [place]) Interventions    Readmission Risk Interventions Readmission Risk Prevention Plan 04/25/2019  Transportation Screening Complete  Home Care Screening Complete  Some recent data might be hidden

## 2019-04-26 NOTE — Progress Notes (Addendum)
Physical Therapy Treatment Patient Details Name: Stephen WITTLER Sr. MRN: [id number] DOB: [birth year] Today's Date: 04/26/2019    History of Present Illness Stephen Rogers  is a 78 y.o. male s/p Open cholecystectomy, partial colectomy, 04/23/19 with hx of hypertension, hyperlipidemia, Dm2 with CKD stage3, DVT in 2017 after sepsis/cholecystitis w perc cholecystostomy, h/o prior colonoscopy 2011=> diverticulosis, cecal adenoma, apparently c/o painless rectal bleeding x3. Slight abdominal discomfort around umbilicus earlier in the day but none presently.  Pt notes nausea.  Pt notes takes aspirin, but denies NSAIDS or blood thinners.  Pt denies dizziness,  Cp, palp, sob, emesis, diarrhea, black stool, dysuria.      PT Comments    Patient presents very diaphoretic with nearly entire gown wet, attempted treatment with patient on room air, but SpO2 dropped to 85% and patient put back on supplemental O2 - RN/NT notified.  Patient tolerated sitting up at bedside for approximately 10 minutes with poor return for completing exercises due to c/o fatigue, limited to a few steps to transfer to chair, had episode of lose watery stool during transfer to chair and limited for sitting up due to c/o fatigue.  Patient tolerated standing for up to 2-3 minutes while being cleaned before put back to bed and required Max assist to reposition in bed.  Patient will benefit from continued physical therapy in hospital and recommended venue below to increase strength, balance, endurance for safe ADLs and gait.   Follow Up Recommendations  SNF;Supervision/Assistance - 24 hour;Supervision for mobility/OOB     Equipment Recommendations  None recommended by PT    Recommendations for Other Services       Precautions / Restrictions Precautions Precautions: Fall Restrictions Weight Bearing Restrictions: No    Mobility  Bed Mobility Overal bed mobility: Needs Assistance Bed Mobility: Supine to Sit;Sit to  Supine;Rolling Rolling: Mod assist   Supine to sit: Mod assist;Max assist Sit to supine: Mod assist   General bed mobility comments: poor return for completing logrolling technique due to weakness  Transfers Overall transfer level: Needs assistance Equipment used: Rolling walker (2 wheeled) Transfers: Sit to/from [facility] Sit to Stand: Mod assist Stand pivot transfers: Mod assist       General transfer comment: slow labored movement with increased leaning on RW due to weakness  Ambulation/Gait Ambulation/Gait assistance: Mod assist;Max assist Gait Distance (Feet): 3 Feet Assistive device: Rolling walker (2 wheeled) Gait Pattern/deviations: Decreased step length - right;Decreased step length - left;Decreased stride length Gait velocity: decreased   General Gait Details: limited to 3-4 slow unsteady labored side steps due to generalized weakness and c/o fatigue   Stairs             Wheelchair Mobility    Modified Rankin (Stroke Patients Only)       Balance Overall balance assessment: Needs assistance Sitting-balance support: Feet supported;Bilateral upper extremity supported Sitting balance-Leahy Scale: Fair     Standing balance support: Bilateral upper extremity supported;During functional activity Standing balance-Leahy Scale: Poor Standing balance comment: fair/poor using RW                            Cognition Arousal/Alertness: Awake/alert Behavior During Therapy: WFL for tasks assessed/performed Overall Cognitive Status: Within Functional Limits for tasks assessed  Exercises General Exercises - Lower Extremity Heel Raises: Seated;AROM;Strengthening;Both;10 reps    General Comments        Pertinent Vitals/Pain Pain Assessment: 0-10 Pain Score: 5  Pain Location: right side of abdomen at surgical site  Pain Descriptors / Indicators: Sore;Discomfort Pain  Intervention(s): Limited activity within patient's tolerance;Monitored during session    Home Living                      Prior Function            PT Goals (current goals can now be found in the care plan section) Acute Rehab PT Goals Patient Stated Goal: return home with family to assist PT Goal Formulation: With patient Time For Goal Achievement: 05/03/19 Potential to Achieve Goals: Good Progress towards PT goals: Not progressing toward goals - comment(generalized weakness)    Frequency    Min 3X/week      PT Plan Current plan remains appropriate;Discharge plan needs to be updated    Co-evaluation              AM-PAC PT "6 Clicks" Mobility   Outcome Measure  Help needed turning from your back to your side while in a flat bed without using bedrails?: A Lot Help needed moving from lying on your back to sitting on the side of a flat bed without using bedrails?: A Lot Help needed moving to and from a bed to a chair (including a wheelchair)?: A Lot Help needed standing up from a chair using your arms (e.g., wheelchair or bedside chair)?: A Lot Help needed to walk in hospital room?: A Lot Help needed climbing 3-5 steps with a railing? : A Lot 6 Click Score: 12    End of Session Equipment Utilized During Treatment: Oxygen Activity Tolerance: Patient limited by fatigue;Patient limited by pain Patient left: in bed;with call bell/phone within reach;with bed alarm set Nurse Communication: Mobility status PT Visit Diagnosis: Unsteadiness on feet (R26.81);Other abnormalities of gait and mobility (R26.89);Muscle weakness (generalized) (M62.81)     Time: 1610-9604 PT Time Calculation (min) (ACUTE ONLY): 41 min  Charges:  $Therapeutic Activity: 38-52 mins                     1:05 PM, 04/26/19 Lonell Grandchild, MPT Physical Therapist with [facility] 336 [phone] office [phone] mobile phone

## 2019-04-27 ENCOUNTER — Inpatient Hospital Stay (HOSPITAL_COMMUNITY): Payer: Medicare HMO

## 2019-04-27 LAB — BASIC METABOLIC PANEL
Anion gap: 8 (ref 5–15)
BUN: 16 mg/dL (ref 8–23)
CO2: 24 mmol/L (ref 22–32)
Calcium: 7.4 mg/dL — ABNORMAL LOW (ref 8.9–10.3)
Chloride: 106 mmol/L (ref 98–111)
Creatinine, Ser: 1.33 mg/dL — ABNORMAL HIGH (ref 0.61–1.24)
GFR calc Af Amer: 59 mL/min — ABNORMAL LOW (ref >60)
GFR calc non Af Amer: 50 mL/min — ABNORMAL LOW (ref >60)
Glucose, Bld: 151 mg/dL — ABNORMAL HIGH (ref 70–99)
Potassium: 3.9 mmol/L (ref 3.5–5.1)
Sodium: 138 mmol/L (ref 135–145)

## 2019-04-27 LAB — GLUCOSE, CAPILLARY
Glucose-Capillary: 131 mg/dL — ABNORMAL HIGH (ref 70–99)
Glucose-Capillary: 158 mg/dL — ABNORMAL HIGH (ref 70–99)
Glucose-Capillary: 136 mg/dL — ABNORMAL HIGH (ref 70–99)
Glucose-Capillary: 138 mg/dL — ABNORMAL HIGH (ref 70–99)

## 2019-04-27 LAB — URINALYSIS, ROUTINE W REFLEX MICROSCOPIC
Bacteria, UA: NONE SEEN
Bilirubin Urine: NEGATIVE
Glucose, UA: NEGATIVE mg/dL
Hgb urine dipstick: NEGATIVE
Ketones, ur: NEGATIVE mg/dL
Leukocytes,Ua: NEGATIVE
Nitrite: NEGATIVE
Protein, ur: 30 mg/dL — AB
Specific Gravity, Urine: 1.016 (ref 1.005–1.030)
pH: 5 (ref 5.0–8.0)

## 2019-04-27 LAB — CBC
HCT: 23.5 % — ABNORMAL LOW (ref 39.0–52.0)
Hemoglobin: 7.5 g/dL — ABNORMAL LOW (ref 13.0–17.0)
MCH: 30 pg (ref 26.0–34.0)
MCHC: 31.9 g/dL (ref 30.0–36.0)
MCV: 94 fL (ref 80.0–100.0)
Platelets: 245 10*3/uL (ref 150–400)
RBC: 2.5 MIL/uL — ABNORMAL LOW (ref 4.22–5.81)
RDW: 14.4 % (ref 11.5–15.5)
WBC: 13.9 10*3/uL — ABNORMAL HIGH (ref 4.0–10.5)
nRBC: 0 % (ref 0.0–0.2)

## 2019-04-27 LAB — PREPARE RBC (CROSSMATCH)

## 2019-04-27 LAB — MAGNESIUM: Magnesium: 2 mg/dL (ref 1.7–2.4)

## 2019-04-27 MED ORDER — SODIUM CHLORIDE 0.9% IV SOLUTION
Freq: Once | INTRAVENOUS | Status: AC
  Administered 2019-04-29: 17:00:00 via INTRAVENOUS

## 2019-04-27 MED ORDER — PIPERACILLIN-TAZOBACTAM 3.375 G IVPB
3.3750 g | Freq: Three times a day (TID) | INTRAVENOUS | Status: DC
  Administered 2019-04-27 – 2019-05-01 (×11): 3.375 g via INTRAVENOUS
  Filled 2019-04-27 (×11): qty 50

## 2019-04-27 MED ORDER — POLYETHYLENE GLYCOL 3350 17 G PO PACK
17.0000 g | PACK | Freq: Every day | ORAL | Status: DC
  Administered 2019-04-27 – 2019-04-29 (×2): 17 g via ORAL
  Filled 2019-04-27 (×2): qty 1

## 2019-04-27 MED ORDER — MAGNESIUM HYDROXIDE 400 MG/5ML PO SUSP
30.0000 mL | Freq: Two times a day (BID) | ORAL | Status: DC
  Administered 2019-04-27 – 2019-04-29 (×4): 30 mL via ORAL
  Filled 2019-04-27 (×5): qty 30

## 2019-04-27 NOTE — Progress Notes (Signed)
PROGRESS NOTE    Stephen CORNELIO Sr.  BPZ:[id number] DOB: 06/22/1941 DOA: 04/18/2019 PCP: [name], MD   Brief Narrative:   78 y.o. male admitted with painless rectal bleeding.Pt was found to have a cholecolo fistula and taken to OR 6/1 for open cholecystectomy and partial colectomy.  He is noted to have some continued weakness and barely participates with physical therapy.  He is having some bowel movements.  Assessment & Plan:   Principal Problem:   Rectal bleeding Active Problems:   Diabetes mellitus (HCC)   CKD (chronic kidney disease), stage III (HCC)   Anemia   History of cholecystitis   Calculus of gallbladder with cholecystitis without biliary obstruction   Choloenteric fistula   1. Postop day #4s/p open cholecystectomy and partial colectomy- Pt had colonscopy done 5/30 with findings of abnormal area of colon at hepatic flexure. He had a CT abdomen with findings of a fistula involving the gallbladder and colon. Surgery was consulted. He was taken to OR on 6/1. He is recovering fairly slowly and tolerating diet with some bowel movement and flatus noted.  He still continues to have some abdominal distention and ileus with leukocytosis noted this morning.  Chest x-ray as well as urine analysis and urine culture ordered for further evaluation this morning.  Refuses to participate much with PT and ambulate.  Milk of magnesia ordered per general surgery. 2. Cholecolo fistula -s/p open chole and partial colectomy6/1/20. Pt now on diet and tolerating well. 3. Hyperkalemia -resolved.  Follow-up in a.m. 4. Mild sinus tachycardia-asymptomatic. Restart home verapamil to start at bedtime. Metoprolol IV to be ordered as needed in the meantime. Okay to transfer to telemetry floor. 5. Acute blood loss anemia -1 unit PRBC transfusion ordered per general surgery and will recheck CBC in a.m. 6. Dyslipidemia. Hold statin for now. 7. Hypertension. Restarted verapamil and will  hold spironolactone, lisinopril, and hydralazine until blood pressures improved. Type 2 DM - continue sliding scale coverage and CBG testing.Improved control noted. Hold metformin and glipizide for now.   DVT prophylaxis:Lovenox Code Status:Full Family Communication:Will plan to update daughter Disposition Plan:Advance diet per general surgery and ambulate today.  Recheck labs in a.m. and anticipate discharge in a.m. if stable.  PT evaluation pending today.   Consultants:  GI  GS  Procedures: Impression: - One 4 mm polyp at the recto-sigmoid colon,  removed with a cold snare. Resected and retrieved.  Colonic lipoma. Markedly abnormal area of colon at the hepatic  flexure of uncertain etiology. (Query fistula,  atypical presentation for infiltrating tumor or  possibly related to previously placed  cholecystostomy tube). This area is suspicious for  site of bleeding.  6/1- open cholecystectomy and partial colectomy  Antimicrobials:  Anti-infectives (From admission, onward)   Start     Dose/Rate Route Frequency Ordered Stop   04/24/19 1030  ertapenem (INVANZ) 1,000 mg in sodium chloride 0.9 % 50 mL IVPB     1 g 100 mL/hr over 30 Minutes Intravenous Every 24 hours 04/23/19 1426     04/23/19 1000  ertapenem (INVANZ) 1,000 mg in sodium chloride 0.9 % 100 mL IVPB     1 g 200 mL/hr over 30 Minutes Intravenous  Once 04/23/19 0930 04/23/19 1345   04/22/19 1030  ertapenem (INVANZ) 1,000 mg in sodium chloride 0.9 % 100 mL IVPB     1 g 200 mL/hr over 30 Minutes Intravenous On call to O.R. 04/22/19 1021 04/23/19 0559        Subjective:  Patient seen and evaluated today with no new acute complaints or concerns. No acute concerns or events noted overnight.  He is  noted to feel weak and continues to have abdominal pain that is persistent.  He denies any nausea or vomiting.  Objective: Vitals:   04/26/19 0544 04/26/19 1200 04/26/19 2246 04/27/19 0534  BP: 125/71 128/69 (!) 148/80 (!) 142/82  Pulse: (!) 108 (!) 110 (!) 103 (!) 111  Resp: 16 20 18 17   Temp: 99.3 F (37.4 C) 100 F (37.8 C) 98.2 F (36.8 C) 99.1 F (37.3 C)  TempSrc: Oral Oral Oral Oral  SpO2: 91% 96% 98% 92%  Weight:      Height:        Intake/Output Summary (Last 24 hours) at 04/27/2019 1013 Last data filed at 04/27/2019 0400 Gross per 24 hour  Intake 1355 ml  Output -  Net 1355 ml   Filed Weights   04/23/19 1335 04/24/19 0500 04/25/19 0500  Weight: 90.7 kg 94.4 kg 98.1 kg    Examination:  General exam: Appears calm and comfortable  Respiratory system: Clear to auscultation. Respiratory effort normal. Cardiovascular system: S1 & S2 heard, RRR. No JVD, murmurs, rubs, gallops or clicks. No pedal edema. Gastrointestinal system: Abdomen is minimally distended, soft and nontender. No organomegaly or masses felt. Normal bowel sounds heard.  Incision clean dry and intact.  Hypoactive bowel sounds. Central nervous system: Alert and oriented. No focal neurological deficits. Extremities: Symmetric 5 x 5 power. Skin: No rashes, lesions or ulcers Psychiatry: Difficult to assess.    Data Reviewed: I have personally reviewed following labs and imaging studies  CBC: Recent Labs  Lab 04/23/19 1510 04/24/19 0435 04/25/19 0414 04/26/19 0558 04/27/19 0609  WBC 7.5 12.2* 12.2* 12.8* 13.9*  HGB 10.1* 8.9* 7.3* 7.1* 7.5*  HCT 32.7* 28.7* 23.1* 23.0* 23.5*  MCV 95.9 95.7 94.3 94.3 94.0  PLT 287 240 207 218 767   Basic Metabolic Panel: Recent Labs  Lab 04/21/19 0617 04/23/19 1510 04/24/19 0435 04/25/19 0414 04/26/19 0558 04/27/19 0609  NA 140  --  136 137 137 138  K 4.0  --  5.4* 4.5 3.9 3.9  CL 106  --  106 108 107 106  CO2 26  --  20* 22 23 24   GLUCOSE 142*  --   227* 138* 152* 151*  BUN 9  --  19 17 14 16   CREATININE 1.13 1.63* 1.48* 1.30* 1.24 1.33*  CALCIUM 8.6*  --  7.4* 7.1* 7.2* 7.4*  MG  --   --  1.5* 1.7 1.9 2.0  PHOS  --   --  3.4 2.5  --   --    GFR: Estimated Creatinine Clearance: 49.4 mL/min (A) (by C-G formula based on SCr of 1.33 mg/dL (H)). Liver Function Tests: Recent Labs  Lab 04/21/19 0617 04/24/19 0435 04/25/19 0414 04/26/19 0558  AST 36 42* 32 25  ALT 55* 47* 36 26  ALKPHOS 110 81 73 83  BILITOT 0.6 0.4 0.6 0.6  PROT 6.2* 5.6* 5.2* 5.4*  ALBUMIN 3.3* 2.8* 2.5* 2.4*   No results for input(s): LIPASE, AMYLASE in the last 168 hours. No results for input(s): AMMONIA in the last 168 hours. Coagulation Profile: No results for input(s): INR, PROTIME in the last 168 hours. Cardiac Enzymes: No results for input(s): CKTOTAL, CKMB, CKMBINDEX, TROPONINI in the last 168 hours. BNP (last 3 results) No results for input(s): PROBNP in the last 8760 hours. HbA1C: No results for input(s): HGBA1C in  the last 72 hours. CBG: Recent Labs  Lab 04/26/19 0734 04/26/19 1127 04/26/19 1626 04/26/19 2247 04/27/19 0734  GLUCAP 131* 193* 130* 132* 131*   Lipid Profile: No results for input(s): CHOL, HDL, LDLCALC, TRIG, CHOLHDL, LDLDIRECT in the last 72 hours. Thyroid Function Tests: No results for input(s): TSH, T4TOTAL, FREET4, T3FREE, THYROIDAB in the last 72 hours. Anemia Panel: No results for input(s): VITAMINB12, FOLATE, FERRITIN, TIBC, IRON, RETICCTPCT in the last 72 hours. Sepsis Labs: No results for input(s): PROCALCITON, LATICACIDVEN in the last 168 hours.  Recent Results (from the past 240 hour(s))  SARS Coronavirus 2 (CEPHEID - Performed in [place] hospital lab), Hosp Order     Status: None   Collection Time: 04/18/19  5:08 PM  Result Value Ref Range Status   SARS Coronavirus 2 NEGATIVE NEGATIVE Final    Comment: (NOTE) If result is NEGATIVE SARS-CoV-2 target nucleic acids are NOT DETECTED. The SARS-CoV-2 RNA is  generally detectable in upper and lower  respiratory specimens during the acute phase of infection. The lowest  concentration of SARS-CoV-2 viral copies this assay can detect is 250  copies / mL. A negative result does not preclude SARS-CoV-2 infection  and should not be used as the sole basis for treatment or other  patient management decisions.  A negative result may occur with  improper specimen collection / handling, submission of specimen other  than nasopharyngeal swab, presence of viral mutation(s) within the  areas targeted by this assay, and inadequate number of viral copies  (<250 copies / mL). A negative result must be combined with clinical  observations, patient history, and epidemiological information. If result is POSITIVE SARS-CoV-2 target nucleic acids are DETECTED. The SARS-CoV-2 RNA is generally detectable in upper and lower  respiratory specimens dur ing the acute phase of infection.  Positive  results are indicative of active infection with SARS-CoV-2.  Clinical  correlation with patient history and other diagnostic information is  necessary to determine patient infection status.  Positive results do  not rule out bacterial infection or co-infection with other viruses. If result is PRESUMPTIVE POSTIVE SARS-CoV-2 nucleic acids MAY BE PRESENT.   A presumptive positive result was obtained on the submitted specimen  and confirmed on repeat testing.  While 2019 novel coronavirus  (SARS-CoV-2) nucleic acids may be present in the submitted sample  additional confirmatory testing may be necessary for epidemiological  and / or clinical management purposes  to differentiate between  SARS-CoV-2 and other Sarbecovirus currently known to infect humans.  If clinically indicated additional testing with an alternate test  methodology [id number]) is advised. The SARS-CoV-2 RNA is generally  detectable in upper and lower respiratory sp ecimens during the acute  phase of infection.  The expected result is Negative. Fact Sheet for Patients:  StrictlyIdeas.no Fact Sheet for Healthcare Providers: BankingDealers.co.za This test is not yet approved or cleared by the [place] FDA and has been authorized for detection and/or diagnosis of SARS-CoV-2 by FDA under an Emergency Use Authorization (EUA).  This EUA will remain in effect (meaning this test can be used) for the duration of the COVID-19 declaration under Section 564(b)(1) of the Act, 21 U.S.C. section 360bbb-3(b)(1), unless the authorization is terminated or revoked sooner. Performed at [facility], [address] [place], [place] [address]   Surgical pcr screen     Status: None   Collection Time: 04/22/19  7:33 AM  Result Value Ref Range Status   MRSA, PCR NEGATIVE NEGATIVE Final   Staphylococcus  aureus NEGATIVE NEGATIVE Final    Comment: (NOTE) The Xpert SA Assay (FDA approved for NASAL specimens in patients 57 years of age and older), is one component of a comprehensive surveillance program. It is not intended to diagnose infection nor to guide or monitor treatment. Performed at [facility], [address], [place], [place] [address]          Radiology Studies: No results found.      Scheduled Meds: . sodium chloride   Intravenous Once  . Chlorhexidine Gluconate Cloth  6 each Topical Q0600  . enoxaparin (LOVENOX) injection  40 mg Subcutaneous Q24H  . insulin aspart  0-15 Units Subcutaneous TID WC  . magnesium hydroxide  30 mL Oral BID  . magnesium oxide  400 mg Oral BID  . pantoprazole  40 mg Oral Q1200  . polyethylene glycol  17 g Oral Daily  . verapamil  240 mg Oral QHS   Continuous Infusions: . ertapenem (INVANZ) IV 1,000 mg (04/26/19 1300)     LOS: 7 days    Time spent: 30 minutes    Stephen Deangelo Darleen Crocker, DO [facility] Pager [phone]  If 7PM-7AM, please contact night-coverage [email or web address] Password [email or web address] 04/27/2019,  10:13 AM

## 2019-04-27 NOTE — Care Management Important Message (Signed)
Important Message  Patient Details  Name: Stephen BRAY Sr. MRN: [id number] Date of Birth: 01/11/1941   Medicare Important Message Given:  Yes    Tommy Medal 04/27/2019, 1:25 PM

## 2019-04-27 NOTE — Progress Notes (Signed)
4 Days Post-Op  Subjective: Patient feels weak.  Minimal abdominal pain.  No nausea or vomiting noted.  Objective: Vital signs in last 24 hours: Temp:  [98.2 F (36.8 C)-100 F (37.8 C)] 99.1 F (37.3 C) (06/05 0534) Pulse Rate:  [103-111] 111 (06/05 0534) Resp:  [17-20] 17 (06/05 0534) BP: (128-148)/(69-82) 142/82 (06/05 0534) SpO2:  [92 %-98 %] 92 % (06/05 0534) Last BM Date: 04/26/19  Intake/Output from previous day: 06/04 0701 - 06/05 0700 In: 1355 [P.O.:480; I.V.:825; IV Piggyback:50] Out: -  Intake/Output this shift: No intake/output data recorded.  General appearance: alert, cooperative and no distress GI: Soft.  Incision healing well.  Patient's belly is distended but he states this is in his normal state.  It is not rigid.  It is nontender.  Minimal bowel sounds appreciated.  Lab Results:  Recent Labs    04/26/19 0558 04/27/19 0609  WBC 12.8* 13.9*  HGB 7.1* 7.5*  HCT 23.0* 23.5*  PLT 218 245   BMET Recent Labs    04/26/19 0558 04/27/19 0609  NA 137 138  K 3.9 3.9  CL 107 106  CO2 23 24  GLUCOSE 152* 151*  BUN 14 16  CREATININE 1.24 1.33*  CALCIUM 7.2* 7.4*   PT/INR No results for input(s): LABPROT, INR in the last 72 hours.  Studies/Results: No results found.  Anti-infectives: Anti-infectives (From admission, onward)   Start     Dose/Rate Route Frequency Ordered Stop   04/24/19 1030  ertapenem (INVANZ) 1,000 mg in sodium chloride 0.9 % 50 mL IVPB     1 g 100 mL/hr over 30 Minutes Intravenous Every 24 hours 04/23/19 1426     04/23/19 1000  ertapenem (INVANZ) 1,000 mg in sodium chloride 0.9 % 100 mL IVPB     1 g 200 mL/hr over 30 Minutes Intravenous  Once 04/23/19 0930 04/23/19 1345   04/22/19 1030  ertapenem (INVANZ) 1,000 mg in sodium chloride 0.9 % 100 mL IVPB     1 g 200 mL/hr over 30 Minutes Intravenous On call to O.R. 04/22/19 1021 04/23/19 0559      Assessment/Plan: s/p Procedure(s): CHOLECYSTECTOMY PARTIAL  COLECTOMY Impression: Patient still running a slight leukocytosis.  Will get chest x-ray.  Will transfuse 1 unit of packed red blood cells due to symptomatic anemia as he felt weak when being transported for a chest x-ray.  Hypomagnesemia resolved.  Will give cathartics.  Urinalysis pending.  LOS: 7 days    Aviva Signs 04/27/2019

## 2019-04-28 ENCOUNTER — Inpatient Hospital Stay (HOSPITAL_COMMUNITY): Payer: Medicare HMO

## 2019-04-28 ENCOUNTER — Encounter (HOSPITAL_COMMUNITY): Payer: Self-pay | Admitting: Internal Medicine

## 2019-04-28 LAB — BASIC METABOLIC PANEL
Anion gap: 10 (ref 5–15)
BUN: 27 mg/dL — ABNORMAL HIGH (ref 8–23)
CO2: 23 mmol/L (ref 22–32)
Calcium: 7.5 mg/dL — ABNORMAL LOW (ref 8.9–10.3)
Chloride: 105 mmol/L (ref 98–111)
Creatinine, Ser: 2.01 mg/dL — ABNORMAL HIGH (ref 0.61–1.24)
GFR calc Af Amer: 36 mL/min — ABNORMAL LOW (ref >60)
GFR calc non Af Amer: 31 mL/min — ABNORMAL LOW (ref >60)
Glucose, Bld: 185 mg/dL — ABNORMAL HIGH (ref 70–99)
Potassium: 3.9 mmol/L (ref 3.5–5.1)
Sodium: 138 mmol/L (ref 135–145)

## 2019-04-28 LAB — URINE CULTURE: Culture: NO GROWTH

## 2019-04-28 LAB — CBC
HCT: 24.3 % — ABNORMAL LOW (ref 39.0–52.0)
Hemoglobin: 7.8 g/dL — ABNORMAL LOW (ref 13.0–17.0)
MCH: 29 pg (ref 26.0–34.0)
MCHC: 32.1 g/dL (ref 30.0–36.0)
MCV: 90.3 fL (ref 80.0–100.0)
Platelets: 254 10*3/uL (ref 150–400)
RBC: 2.69 MIL/uL — ABNORMAL LOW (ref 4.22–5.81)
RDW: 15.7 % — ABNORMAL HIGH (ref 11.5–15.5)
WBC: 12.1 10*3/uL — ABNORMAL HIGH (ref 4.0–10.5)
nRBC: 0 % (ref 0.0–0.2)

## 2019-04-28 LAB — GLUCOSE, CAPILLARY
Glucose-Capillary: 142 mg/dL — ABNORMAL HIGH (ref 70–99)
Glucose-Capillary: 157 mg/dL — ABNORMAL HIGH (ref 70–99)
Glucose-Capillary: 156 mg/dL — ABNORMAL HIGH (ref 70–99)
Glucose-Capillary: 145 mg/dL — ABNORMAL HIGH (ref 70–99)

## 2019-04-28 LAB — MAGNESIUM: Magnesium: 2.2 mg/dL (ref 1.7–2.4)

## 2019-04-28 LAB — CREATININE, URINE, RANDOM: Creatinine, Urine: 226.2 mg/dL

## 2019-04-28 LAB — SODIUM, URINE, RANDOM: Sodium, Ur: 18 mmol/L

## 2019-04-28 MED ORDER — HEPARIN SODIUM (PORCINE) 5000 UNIT/ML IJ SOLN
5000.0000 [IU] | Freq: Three times a day (TID) | INTRAMUSCULAR | Status: DC
  Administered 2019-04-28 – 2019-04-29 (×3): 5000 [IU] via SUBCUTANEOUS
  Filled 2019-04-28 (×3): qty 1

## 2019-04-28 MED ORDER — LACTATED RINGERS IV SOLN
INTRAVENOUS | Status: DC
  Administered 2019-04-28 – 2019-05-01 (×5): via INTRAVENOUS

## 2019-04-28 NOTE — Progress Notes (Signed)
[facility] Progress Note  5 Days Post-Op  Subjective: Received 1 U pRBC last night and had low grade fever prior.  H&H only slightly up after unit. No complaints. Says he is just resting. He did not get the chair or walk yesterday. He refused PT.  He is eating and having Bms. He had one this AM.  He denies nausea.  IS is only pulling < 500, and O2 is on 4L now. CXR from yesterday with low volumes and atelectasis.  Creatinine also up this AM, Dr. Manuella Ghazi has ordered US and urine lytes.   Objective: Vital signs in last 24 hours: Temp:  [98.7 F (37.1 C)-100.3 F (37.9 C)] 98.9 F (37.2 C) (06/06 0622) Pulse Rate:  [81-93] 81 (06/06 0622) Resp:  [18-24] 20 (06/06 0622) BP: (107-126)/(53-72) 107/56 (06/06 0622) SpO2:  [88 %-100 %] 92 % (06/06 0753) Last BM Date: 04/26/19  Intake/Output from previous day: 06/05 0701 - 06/06 0700 In: 1130.6 [P.O.:720; Blood:315; IV Piggyback:95.6] Out: 550 [Urine:550] Intake/Output this shift: Total I/O In: 240 [P.O.:240] Out: -   General appearance: alert, cooperative and no distress Resp: normal work of breathing, O2 4L  GI: soft, appropriately tender, distended, RUQ incision c/d/i with honeycomb dressing and betadine at incision, no erythema or drainage noted Extremities: minor edema  Lab Results:  Recent Labs    04/27/19 0609 04/28/19 0757  WBC 13.9* 12.1*  HGB 7.5* 7.8*  HCT 23.5* 24.3*  PLT 245 254   BMET Recent Labs    04/27/19 0609 04/28/19 0757  NA 138 138  K 3.9 3.9  CL 106 105  CO2 24 23  GLUCOSE 151* 185*  BUN 16 27*  CREATININE 1.33* 2.01*  CALCIUM 7.4* 7.5*   PT/INR No results for input(s): LABPROT, INR in the last 72 hours.  Studies/Results: Dg Chest Port 1 View  Result Date: 04/27/2019 CLINICAL DATA:  Fever. EXAM: PORTABLE CHEST 1 VIEW COMPARISON:  10/21/2016. FINDINGS: Again demonstrated is a very poor inspiration. Mild-to-moderate bibasilar atelectasis. Mild peribronchial thickening. Stable  borderline enlarged cardiac silhouette. Unremarkable bones. IMPRESSION: 1. Poor inspiration with mild to moderate bibasilar atelectasis. 2. Mild bronchitic changes. Electronically Signed   By: Claudie Revering M.D.   On: 04/27/2019 13:28    Anti-infectives: Anti-infectives (From admission, onward)   Start     Dose/Rate Route Frequency Ordered Stop   04/27/19 1400  piperacillin-tazobactam (ZOSYN) IVPB 3.375 g     3.375 g 12.5 mL/hr over 240 Minutes Intravenous Every 8 hours 04/27/19 1015     04/24/19 1030  ertapenem (INVANZ) 1,000 mg in sodium chloride 0.9 % 50 mL IVPB  Status:  Discontinued     1 g 100 mL/hr over 30 Minutes Intravenous Every 24 hours 04/23/19 1426 04/27/19 1015   04/23/19 1000  ertapenem (INVANZ) 1,000 mg in sodium chloride 0.9 % 100 mL IVPB     1 g 200 mL/hr over 30 Minutes Intravenous  Once 04/23/19 0930 04/23/19 1345   04/22/19 1030  ertapenem (INVANZ) 1,000 mg in sodium chloride 0.9 % 100 mL IVPB     1 g 200 mL/hr over 30 Minutes Intravenous On call to O.R. 04/22/19 1021 04/23/19 0559      Assessment/Plan: Mr. Coba is an 78 yo POD 5 s/p open cholecystectomy and partial colectomy. Doing fair but not ambulating and not doing IS. I have explained to the patient the reason for the IS and need to ambulate and risk of needing a SNF if he does not ambulate  due to deconditioning.  -Norco PRN for pain, using minimal -IS, OOB< needs to get to chair at least, and work with PT if they come by -Respiratory therapy ordered to help with IS and to help with oxygenation/ wean off O2  -HR 80-90s, improved with metoprolol, BP 161-096E systolic -Diet as tolerated, having Bms, if having loose stools can hold the miralax, MOM  -Leukocytosis slightly down, zosyn post op changed yesterday from invanz, continuing secondary to gross spillage of stool, added differential tomorrow given the hovering leukocytosis  -H&H minimal response, may have some dilution given the Cr elevation and  edema -Creatinine up, lytes ok, US renal ordered and urine lytes, appreciate Dr. Trena Platt assistance  -SCDs, heparin sq from lovenox given Creatinine elevation   Discussed with Dr. Manuella Ghazi.    LOS: 8 days    Virl Cagey 04/28/2019

## 2019-04-28 NOTE — Progress Notes (Signed)
Patient ordered to have PRBCs transfused. Before retrieving blood from lab, patient had a temp of 100.3. MD paged and ordered to give 650 mg of Tylenol. Tylenol given. Patient's vitals were rechecked after 1 hour and patient's temp was down to 100.0. Notified MD and he gave okay to transfuse. Will continue to monitor patient.

## 2019-04-28 NOTE — Progress Notes (Addendum)
UNMATCHED BLOOD PRODUCT NOTE  Compare the patient ID on the blood tag to the patient ID on the hospital armband and Blood Bank armband. Then confirm the unit number on the blood tag matches the unit number on the blood product.  If a discrepancy is discovered return the product to blood bank immediately.   Blood Product Type: Packed Red Blood Cells  Unit #: (Found on blood product bag, begins with W) [id number]  Product Code #: (Found on blood product bag, begins with E) [id number]   Start Time: 0330  Starting Rate: 120 ml/hr  Rate increase/decreased  (if applicable):      ml/hr  Rate changed time (if applicable):    Stop Time: 0620   All Other Documentation should be documented within the Blood Admin Flowsheet per policy.

## 2019-04-28 NOTE — Progress Notes (Signed)
New order for incentive spirometry. Pt has an IS. Pt reinstructed on use of IS and importance of use.

## 2019-04-28 NOTE — Progress Notes (Signed)
PROGRESS NOTE    WOODROW DRAB Sr.  EGB:[id number] DOB: 1941-05-11 DOA: 04/18/2019 PCP: [name], MD   Brief Narrative:   78 y.o. male admitted with painless rectal bleeding.Pt was found to have a cholecolo fistula and taken to OR 6/1 for open cholecystectomy and partial colectomy.  He is noted to have some continued weakness and barely participates with physical therapy.  He is having some bowel movements.  He continues to remain fairly sedentary and is feeling weak.  He has not had much improvement in hemoglobin levels after PRBC transfusion on 6/6.  He is also noted to have worsening creatinine levels this a.m.  but is nonoliguric.  Assessment & Plan:   Principal Problem:   Rectal bleeding Active Problems:   Diabetes mellitus (HCC)   CKD (chronic kidney disease), stage III (HCC)   Anemia   History of cholecystitis   Calculus of gallbladder with cholecystitis without biliary obstruction   Choloenteric fistula   1. Postop day #5s/p open cholecystectomy and partial colectomy- Pt had colonscopy done 5/30 with findings of abnormal area of colon at hepatic flexure. He had a CT abdomen with findings of a fistula involving the gallbladder and colon. Surgery was consulted. He was taken to OR on 6/1. He is recovering fairly slowly and tolerating diet with some bowel movement and flatus noted.  He still continues to have some abdominal distention and ileus with leukocytosis noted this morning.  Chest x-ray as well as urine analysis with no signs of infection and this appears to simply be related to some ileus and atelectasis.  Urine culture pending. Refuses to participate much with PT and ambulate.  Milk of magnesia ordered per general surgery. 2. Cholecolo fistula -s/p open chole and partial colectomy6/1/20. Pt now on diet and tolerating well. 3. Hyperkalemia -resolved.  Follow-up in a.m. 4. Nonoliguric AKI.  Will start on IV fluid and check renal ultrasound as well as urine  electrolytes.  Monitor repeat labs in a.m.  Continue strict I's and O's. 5. Mild sinus tachycardia-asymptomatic. Restart home verapamil to start at bedtime. Metoprolol IV to be ordered as needed in the meantime. Okay to transfer to telemetry floor. 6. Acute blood loss anemia -1 unit PRBC transfusion transfused early on 6/6.  Hemoglobin levels continue to remain stable without any significant improvement.  No overt bleeding identified. 7. Dyslipidemia. Hold statin for now. 8. Hypertension. Restarted verapamil and will hold spironolactone, lisinopril, and hydralazine for now on account of AKI. Type 2 DM - continue sliding scale coverage and CBG testing.Improved control noted. Hold metformin and glipizide for now.   DVT prophylaxis:Lovenox now to heparin Code Status:Full Family Communication:Will plan to update daughter Disposition Plan:PT evaluation reveals need for SNF on discharge, but wants to go home with home health PT services once ready.  Evaluating AKI as noted above and maintain on IV fluid.  Appreciate further general surgery recommendations.   Consultants:  GI  GS  Procedures: Impression: - One 4 mm polyp at the recto-sigmoid colon,  removed with a cold snare. Resected and retrieved.  Colonic lipoma. Markedly abnormal area of colon at the hepatic  flexure of uncertain etiology. (Query fistula,  atypical presentation for infiltrating tumor or  possibly related to previously placed  cholecystostomy tube). This area is suspicious for  site of bleeding.  6/1-open cholecystectomy and partial colectomy  Antimicrobials:  Anti-infectives (From admission, onward)   Start     Dose/Rate Route Frequency Ordered Stop   04/27/19 1400   piperacillin-tazobactam (  ZOSYN) IVPB 3.375 g     3.375 g 12.5 mL/hr over 240 Minutes Intravenous Every 8 hours 04/27/19 1015     04/24/19 1030  ertapenem (INVANZ) 1,000 mg in sodium chloride 0.9 % 50 mL IVPB  Status:  Discontinued     1 g 100 mL/hr over 30 Minutes Intravenous Every 24 hours 04/23/19 1426 04/27/19 1015   04/23/19 1000  ertapenem (INVANZ) 1,000 mg in sodium chloride 0.9 % 100 mL IVPB     1 g 200 mL/hr over 30 Minutes Intravenous  Once 04/23/19 0930 04/23/19 1345   04/22/19 1030  ertapenem (INVANZ) 1,000 mg in sodium chloride 0.9 % 100 mL IVPB     1 g 200 mL/hr over 30 Minutes Intravenous On call to O.R. 04/22/19 1021 04/23/19 0559        Subjective: Patient seen and evaluated today with no new acute complaints or concerns. No acute concerns or events noted overnight.  He continues to feel weak and does not want to ambulate very much.  He states he did have a bowel movement last night and is passing flatus.  Appears to be tolerating diet.  Objective: Vitals:   04/28/19 0354 04/28/19 0439 04/28/19 0622 04/28/19 0753  BP: 115/64 124/72 (!) 107/56   Pulse: 90 91 81   Resp: 18 (!) 24 20   Temp: 99.8 F (37.7 C) 99.5 F (37.5 C) 98.9 F (37.2 C)   TempSrc: Oral Oral Oral   SpO2: 100% 95% 97% 92%  Weight:      Height:        Intake/Output Summary (Last 24 hours) at 04/28/2019 1034 Last data filed at 04/28/2019 0900 Gross per 24 hour  Intake 1130.58 ml  Output 550 ml  Net 580.58 ml   Filed Weights   04/23/19 1335 04/24/19 0500 04/25/19 0500  Weight: 90.7 kg 94.4 kg 98.1 kg    Examination:  General exam: Appears calm and comfortable, somnolent Respiratory system: Clear to auscultation. Respiratory effort normal.  On nasal cannula Cardiovascular system: S1 & S2 heard, RRR. No JVD, murmurs, rubs, gallops or clicks. No pedal edema. Gastrointestinal system: Abdomen is minimally distended, soft and nontender. No organomegaly or masses felt. Normal bowel sounds heard.   Incision clean dry and intact Central nervous system: Alert and awake. Extremities: Symmetric 5 x 5 power. Skin: No rashes, lesions or ulcers Psychiatry: Difficult to assess.    Data Reviewed: I have personally reviewed following labs and imaging studies  CBC: Recent Labs  Lab 04/24/19 0435 04/25/19 0414 04/26/19 0558 04/27/19 0609 04/28/19 0757  WBC 12.2* 12.2* 12.8* 13.9* 12.1*  HGB 8.9* 7.3* 7.1* 7.5* 7.8*  HCT 28.7* 23.1* 23.0* 23.5* 24.3*  MCV 95.7 94.3 94.3 94.0 90.3  PLT 240 207 218 245 366   Basic Metabolic Panel: Recent Labs  Lab 04/24/19 0435 04/25/19 0414 04/26/19 0558 04/27/19 0609 04/28/19 0757  NA 136 137 137 138 138  K 5.4* 4.5 3.9 3.9 3.9  CL 106 108 107 106 105  CO2 20* 22 23 24 23   GLUCOSE 227* 138* 152* 151* 185*  BUN 19 17 14 16  27*  CREATININE 1.48* 1.30* 1.24 1.33* 2.01*  CALCIUM 7.4* 7.1* 7.2* 7.4* 7.5*  MG 1.5* 1.7 1.9 2.0 2.2  PHOS 3.4 2.5  --   --   --    GFR: Estimated Creatinine Clearance: 32.7 mL/min (A) (by C-G formula based on SCr of 2.01 mg/dL (H)). Liver Function Tests: Recent Labs  Lab 04/24/19 0435 04/25/19 0414  04/26/19 0558  AST 42* 32 25  ALT 47* 36 26  ALKPHOS 81 73 83  BILITOT 0.4 0.6 0.6  PROT 5.6* 5.2* 5.4*  ALBUMIN 2.8* 2.5* 2.4*   No results for input(s): LIPASE, AMYLASE in the last 168 hours. No results for input(s): AMMONIA in the last 168 hours. Coagulation Profile: No results for input(s): INR, PROTIME in the last 168 hours. Cardiac Enzymes: No results for input(s): CKTOTAL, CKMB, CKMBINDEX, TROPONINI in the last 168 hours. BNP (last 3 results) No results for input(s): PROBNP in the last 8760 hours. HbA1C: No results for input(s): HGBA1C in the last 72 hours. CBG: Recent Labs  Lab 04/27/19 0734 04/27/19 1120 04/27/19 1619 04/27/19 2108 04/28/19 0718  GLUCAP 131* 158* 136* 138* 142*   Lipid Profile: No results for input(s): CHOL, HDL, LDLCALC, TRIG, CHOLHDL, LDLDIRECT in the last 72 hours.  Thyroid Function Tests: No results for input(s): TSH, T4TOTAL, FREET4, T3FREE, THYROIDAB in the last 72 hours. Anemia Panel: No results for input(s): VITAMINB12, FOLATE, FERRITIN, TIBC, IRON, RETICCTPCT in the last 72 hours. Sepsis Labs: No results for input(s): PROCALCITON, LATICACIDVEN in the last 168 hours.  Recent Results (from the past 240 hour(s))  SARS Coronavirus 2 (CEPHEID - Performed in [place] hospital lab), Hosp Order     Status: None   Collection Time: 04/18/19  5:08 PM  Result Value Ref Range Status   SARS Coronavirus 2 NEGATIVE NEGATIVE Final    Comment: (NOTE) If result is NEGATIVE SARS-CoV-2 target nucleic acids are NOT DETECTED. The SARS-CoV-2 RNA is generally detectable in upper and lower  respiratory specimens during the acute phase of infection. The lowest  concentration of SARS-CoV-2 viral copies this assay can detect is 250  copies / mL. A negative result does not preclude SARS-CoV-2 infection  and should not be used as the sole basis for treatment or other  patient management decisions.  A negative result may occur with  improper specimen collection / handling, submission of specimen other  than nasopharyngeal swab, presence of viral mutation(s) within the  areas targeted by this assay, and inadequate number of viral copies  (<250 copies / mL). A negative result must be combined with clinical  observations, patient history, and epidemiological information. If result is POSITIVE SARS-CoV-2 target nucleic acids are DETECTED. The SARS-CoV-2 RNA is generally detectable in upper and lower  respiratory specimens dur ing the acute phase of infection.  Positive  results are indicative of active infection with SARS-CoV-2.  Clinical  correlation with patient history and other diagnostic information is  necessary to determine patient infection status.  Positive results do  not rule out bacterial infection or co-infection with other viruses. If result is  PRESUMPTIVE POSTIVE SARS-CoV-2 nucleic acids MAY BE PRESENT.   A presumptive positive result was obtained on the submitted specimen  and confirmed on repeat testing.  While 2019 novel coronavirus  (SARS-CoV-2) nucleic acids may be present in the submitted sample  additional confirmatory testing may be necessary for epidemiological  and / or clinical management purposes  to differentiate between  SARS-CoV-2 and other Sarbecovirus currently known to infect humans.  If clinically indicated additional testing with an alternate test  methodology [id number]) is advised. The SARS-CoV-2 RNA is generally  detectable in upper and lower respiratory sp ecimens during the acute  phase of infection. The expected result is Negative. Fact Sheet for Patients:  StrictlyIdeas.no Fact Sheet for Healthcare Providers: BankingDealers.co.za This test is not yet approved or cleared by the [place]  States FDA and has been authorized for detection and/or diagnosis of SARS-CoV-2 by FDA under an Emergency Use Authorization (EUA).  This EUA will remain in effect (meaning this test can be used) for the duration of the COVID-19 declaration under Section 564(b)(1) of the Act, 21 U.S.C. section 360bbb-3(b)(1), unless the authorization is terminated or revoked sooner. Performed at [facility], [address], [place], [place] [address]   Surgical pcr screen     Status: None   Collection Time: 04/22/19  7:33 AM  Result Value Ref Range Status   MRSA, PCR NEGATIVE NEGATIVE Final   Staphylococcus aureus NEGATIVE NEGATIVE Final    Comment: (NOTE) The Xpert SA Assay (FDA approved for NASAL specimens in patients 20 years of age and older), is one component of a comprehensive surveillance program. It is not intended to diagnose infection nor to guide or monitor treatment. Performed at [facility] [address] [place], [place] [address]          Radiology Studies: Dg  Chest [facility] 1 View  Result Date: 04/27/2019 CLINICAL DATA:  Fever. EXAM: PORTABLE CHEST 1 VIEW COMPARISON:  10/21/2016. FINDINGS: Again demonstrated is a very poor inspiration. Mild-to-moderate bibasilar atelectasis. Mild peribronchial thickening. Stable borderline enlarged cardiac silhouette. Unremarkable bones. IMPRESSION: 1. Poor inspiration with mild to moderate bibasilar atelectasis. 2. Mild bronchitic changes. Electronically Signed   By: Claudie Revering M.D.   On: 04/27/2019 13:28        Scheduled Meds: . sodium chloride   Intravenous Once  . Chlorhexidine Gluconate Cloth  6 each Topical Q0600  . heparin injection (subcutaneous)  5,000 Units Subcutaneous Q8H  . insulin aspart  0-15 Units Subcutaneous TID WC  . magnesium hydroxide  30 mL Oral BID  . magnesium oxide  400 mg Oral BID  . pantoprazole  40 mg Oral Q1200  . polyethylene glycol  17 g Oral Daily  . verapamil  240 mg Oral QHS   Continuous Infusions: . lactated ringers    . piperacillin-tazobactam 3.375 g (04/28/19 0847)     LOS: 8 days    Time spent: 30 minutes    Emigdio Wildeman Darleen Crocker, DO [facility] Pager [phone]  If 7PM-7AM, please contact night-coverage [email or web address] Password [email or web address] 04/28/2019, 10:34 AM

## 2019-04-29 ENCOUNTER — Inpatient Hospital Stay (HOSPITAL_COMMUNITY): Payer: Medicare HMO

## 2019-04-29 LAB — CBC WITH DIFFERENTIAL/PLATELET
Abs Immature Granulocytes: 0.16 10*3/uL — ABNORMAL HIGH (ref 0.00–0.07)
Basophils Absolute: 0 10*3/uL (ref 0.0–0.1)
Basophils Relative: 0 %
Eosinophils Absolute: 0.2 10*3/uL (ref 0.0–0.5)
Eosinophils Relative: 1 %
HCT: 23.3 % — ABNORMAL LOW (ref 39.0–52.0)
Hemoglobin: 7.3 g/dL — ABNORMAL LOW (ref 13.0–17.0)
Immature Granulocytes: 1 %
Lymphocytes Relative: 12 %
Lymphs Abs: 1.3 10*3/uL (ref 0.7–4.0)
MCH: 28.1 pg (ref 26.0–34.0)
MCHC: 31.3 g/dL (ref 30.0–36.0)
MCV: 89.6 fL (ref 80.0–100.0)
Monocytes Absolute: 1 10*3/uL (ref 0.1–1.0)
Monocytes Relative: 9 %
Neutro Abs: 8.6 10*3/uL — ABNORMAL HIGH (ref 1.7–7.7)
Neutrophils Relative %: 77 %
Platelets: 264 10*3/uL (ref 150–400)
RBC: 2.6 MIL/uL — ABNORMAL LOW (ref 4.22–5.81)
RDW: 16.3 % — ABNORMAL HIGH (ref 11.5–15.5)
WBC: 11.3 10*3/uL — ABNORMAL HIGH (ref 4.0–10.5)
nRBC: 0 % (ref 0.0–0.2)

## 2019-04-29 LAB — URINALYSIS, COMPLETE (UACMP) WITH MICROSCOPIC
Bacteria, UA: NONE SEEN
Bilirubin Urine: NEGATIVE
Glucose, UA: NEGATIVE mg/dL
Hgb urine dipstick: NEGATIVE
Ketones, ur: NEGATIVE mg/dL
Leukocytes,Ua: NEGATIVE
Nitrite: NEGATIVE
Protein, ur: 30 mg/dL — AB
Specific Gravity, Urine: 1.019 (ref 1.005–1.030)
pH: 5 (ref 5.0–8.0)

## 2019-04-29 LAB — BASIC METABOLIC PANEL
Anion gap: 9 (ref 5–15)
BUN: 26 mg/dL — ABNORMAL HIGH (ref 8–23)
CO2: 23 mmol/L (ref 22–32)
Calcium: 7.6 mg/dL — ABNORMAL LOW (ref 8.9–10.3)
Chloride: 107 mmol/L (ref 98–111)
Creatinine, Ser: 1.82 mg/dL — ABNORMAL HIGH (ref 0.61–1.24)
GFR calc Af Amer: 40 mL/min — ABNORMAL LOW (ref >60)
GFR calc non Af Amer: 35 mL/min — ABNORMAL LOW (ref >60)
Glucose, Bld: 211 mg/dL — ABNORMAL HIGH (ref 70–99)
Potassium: 3.7 mmol/L (ref 3.5–5.1)
Sodium: 139 mmol/L (ref 135–145)

## 2019-04-29 LAB — GRAM STAIN: Special Requests: NORMAL

## 2019-04-29 LAB — SYNOVIAL CELL COUNT + DIFF, W/ CRYSTALS
Crystals, Fluid: NONE SEEN
Eosinophils-Synovial: 0 % (ref 0–1)
Lymphocytes-Synovial Fld: 1 % (ref 0–20)
Monocyte-Macrophage-Synovial Fluid: 1 % — ABNORMAL LOW (ref 50–90)
Neutrophil, Synovial: 97 % — ABNORMAL HIGH (ref 0–25)
Other Cells-SYN: 0
WBC, Synovial: 570 /mm3 — ABNORMAL HIGH (ref 0–200)

## 2019-04-29 LAB — LACTIC ACID, PLASMA
Lactic Acid, Venous: 1.3 mmol/L (ref 0.5–1.9)
Lactic Acid, Venous: 1.5 mmol/L (ref 0.5–1.9)

## 2019-04-29 LAB — GLUCOSE, CAPILLARY
Glucose-Capillary: 190 mg/dL — ABNORMAL HIGH (ref 70–99)
Glucose-Capillary: 206 mg/dL — ABNORMAL HIGH (ref 70–99)
Glucose-Capillary: 214 mg/dL — ABNORMAL HIGH (ref 70–99)
Glucose-Capillary: 134 mg/dL — ABNORMAL HIGH (ref 70–99)

## 2019-04-29 LAB — SEDIMENTATION RATE: Sed Rate: 137 mm/hr — ABNORMAL HIGH (ref 0–16)

## 2019-04-29 LAB — HEMOGLOBIN AND HEMATOCRIT, BLOOD
HCT: 27 % — ABNORMAL LOW (ref 39.0–52.0)
Hemoglobin: 8.8 g/dL — ABNORMAL LOW (ref 13.0–17.0)

## 2019-04-29 LAB — PROTIME-INR
INR: 1.2 (ref 0.8–1.2)
Prothrombin Time: 14.9 seconds (ref 11.4–15.2)

## 2019-04-29 LAB — C-REACTIVE PROTEIN: CRP: 21.8 mg/dL — ABNORMAL HIGH (ref <1.0)

## 2019-04-29 LAB — PREPARE RBC (CROSSMATCH)

## 2019-04-29 LAB — PROCALCITONIN: Procalcitonin: 1.71 ng/mL

## 2019-04-29 MED ORDER — DIPHENHYDRAMINE HCL 50 MG/ML IJ SOLN: 12.5000 mg | Freq: Four times a day (QID) | INTRAMUSCULAR | Status: DC | PRN

## 2019-04-29 MED ORDER — LIDOCAINE HCL (PF) 1 % IJ SOLN
INTRAMUSCULAR | Status: AC
  Administered 2019-04-29: 16:00:00 5 mL
  Filled 2019-04-29: qty 10

## 2019-04-29 MED ORDER — VANCOMYCIN HCL 1.5 G IV SOLR
1500.0000 mg | INTRAVENOUS | Status: DC
  Administered 2019-04-30: 18:00:00 1500 mg via INTRAVENOUS
  Filled 2019-04-29 (×3): qty 1500

## 2019-04-29 MED ORDER — DIPHENHYDRAMINE HCL 12.5 MG/5ML PO ELIX
12.5000 mg | ORAL_SOLUTION | Freq: Four times a day (QID) | ORAL | Status: DC | PRN
  Administered 2019-04-29: 13:00:00 12.5 mg via ORAL
  Filled 2019-04-29: qty 5

## 2019-04-29 MED ORDER — VERAPAMIL HCL ER 240 MG PO TBCR
120.0000 mg | EXTENDED_RELEASE_TABLET | Freq: Every day | ORAL | Status: DC
  Administered 2019-04-29 – 2019-05-02 (×4): 120 mg via ORAL
  Filled 2019-04-29 (×4): qty 1

## 2019-04-29 MED ORDER — SODIUM CHLORIDE 0.9% IV SOLUTION
Freq: Once | INTRAVENOUS | Status: AC
  Administered 2019-04-29: 12:00:00 via INTRAVENOUS

## 2019-04-29 MED ORDER — IBUPROFEN 600 MG PO TABS: 600.0000 mg | ORAL_TABLET | Freq: Once | ORAL | Status: DC

## 2019-04-29 MED ORDER — VANCOMYCIN HCL IN DEXTROSE 1-5 GM/200ML-% IV SOLN
1000.0000 mg | INTRAVENOUS | Status: AC
  Administered 2019-04-29 (×2): 1000 mg via INTRAVENOUS
  Filled 2019-04-29 (×2): qty 200

## 2019-04-29 MED ORDER — PANTOPRAZOLE SODIUM 40 MG IV SOLR
40.0000 mg | Freq: Two times a day (BID) | INTRAVENOUS | Status: DC
  Administered 2019-04-29 – 2019-04-30 (×4): 40 mg via INTRAVENOUS
  Filled 2019-04-29 (×5): qty 40

## 2019-04-29 NOTE — Progress Notes (Signed)
[facility] Progress Note  6 Days Post-Op  Subjective: Patient doing fair but is refusing to get up and move. He does not want to ambulate, and refuses to get to the chair. He refuses to get to the chair even with my help and the RN help.  Spoke with his sister Katharine Look, and she said that she would try to encourage to move today.  She says that he did this the last time he was in the hospital, and that he did not have good motivation. She also thinks that lack of visitors also is adding to it.   Had a bloody BM this AM. Transfusion pending.  Creatinine coming down and UOP up to 1.2L.    He says he feels sore and does not want to move because of the pain. He says that he has no nausea or bloating.   Objective: Vital signs in last 24 hours: Temp:  [98.3 F (36.8 C)-99.9 F (37.7 C)] 99.7 F (37.6 C) (06/07 1217) Pulse Rate:  [86-92] 87 (06/07 1217) Resp:  [17-20] 20 (06/07 1217) BP: (122-141)/(60-72) 129/72 (06/07 1217) SpO2:  [93 %-94 %] 94 % (06/07 1217) Last BM Date: 04/29/19  Intake/Output from previous day: 06/06 0701 - 06/07 0700 In: 1794.2 [P.O.:960; I.V.:685; IV Piggyback:149.2] Out: 1250 [Urine:1250] Intake/Output this shift: Total I/O In: 240 [P.O.:240] Out: -   General appearance: alert, cooperative and no distress Resp: low effort on IS, pulling 500, normal work of breathing GI: soft, mildly distended, appropriately tender, honeycomb with betadine stain, no erythema or drainage Extremities: mild edema bilaterally  Lab Results:  Recent Labs    04/28/19 0757 04/29/19 0702  WBC 12.1* 11.3*  HGB 7.8* 7.3*  HCT 24.3* 23.3*  PLT 254 264   BMET Recent Labs    04/28/19 0757 04/29/19 0702  NA 138 139  K 3.9 3.7  CL 105 107  CO2 23 23  GLUCOSE 185* 211*  BUN 27* 26*  CREATININE 2.01* 1.82*  CALCIUM 7.5* 7.6*    Studies/Results: US Renal  Result Date: 04/28/2019 CLINICAL DATA:  Acute renal insufficiency. Patient currently admitted.  Diabetes. EXAM: RENAL / URINARY TRACT ULTRASOUND COMPLETE COMPARISON:  CT 04/19/2019 FINDINGS: Right Kidney: Renal measurements: 10.8 x 5.5 x 6.8 cm = volume: 210 mL . Echogenicity within normal limits. No mass or hydronephrosis visualized. 2.3 cm upper pole cyst. Left Kidney: Renal measurements: 9.0 x 6.0 x 5.1 cm = volume: 144 mL. Echogenicity within normal limits. No mass or hydronephrosis visualized. 2.6 cm midpole cyst and 2.5 cm upper pole cyst. Bladder: Appears normal for degree of bladder distention. IMPRESSION: Normal size kidneys without hydronephrosis.  Bilateral renal cysts. Electronically Signed   By: Marin Olp M.D.   On: 04/28/2019 16:26   Dg Chest Port 1 View  Result Date: 04/27/2019 CLINICAL DATA:  Fever. EXAM: PORTABLE CHEST 1 VIEW COMPARISON:  10/21/2016. FINDINGS: Again demonstrated is a very poor inspiration. Mild-to-moderate bibasilar atelectasis. Mild peribronchial thickening. Stable borderline enlarged cardiac silhouette. Unremarkable bones. IMPRESSION: 1. Poor inspiration with mild to moderate bibasilar atelectasis. 2. Mild bronchitic changes. Electronically Signed   By: Claudie Revering M.D.   On: 04/27/2019 13:28    Anti-infectives: Anti-infectives (From admission, onward)   Start     Dose/Rate Route Frequency Ordered Stop   04/27/19 1400  piperacillin-tazobactam (ZOSYN) IVPB 3.375 g     3.375 g 12.5 mL/hr over 240 Minutes Intravenous Every 8 hours 04/27/19 1015     04/24/19 1030  ertapenem (INVANZ) 1,000  mg in sodium chloride 0.9 % 50 mL IVPB  Status:  Discontinued     1 g 100 mL/hr over 30 Minutes Intravenous Every 24 hours 04/23/19 1426 04/27/19 1015   04/23/19 1000  ertapenem (INVANZ) 1,000 mg in sodium chloride 0.9 % 100 mL IVPB     1 g 200 mL/hr over 30 Minutes Intravenous  Once 04/23/19 0930 04/23/19 1345   04/22/19 1030  ertapenem (INVANZ) 1,000 mg in sodium chloride 0.9 % 100 mL IVPB     1 g 200 mL/hr over 30 Minutes Intravenous On call to O.R. 04/22/19 1021  04/23/19 0559      Assessment/Plan: Mr. Koppelman is a 78 yo POD 6 s/p open cholecystectomy and partial colectomy.Bloody BM this AM and getting transfusion today. Hemodynamics the same. Patient refusing ambulation and getting to the chair even with assistance.  Spoke with his sister to encourage him. I think he will likely end up in a SNF.  -Norco and fentanyl PRN for pain -IS not really improved and remains on 3L O2, instructed patient again on how to do and importance to prevent PNA, his effort is minimal  -Hemodynamics stable with blood BM, monitor  -Diet as tolerated, having Bms -Leukocytosis down and no shift, post op zosyn for gross spillage, will keep for now -H&H drifting still and bloody BM, transfusion pending  -Creatinine down, lytes ok, US renal normal  -SCDs, heparin sq -Will need SNF placement given lack of activity, PT ordered but patient refuses  -Sister to encourage movement today    LOS: 9 days    Virl Cagey 04/29/2019

## 2019-04-29 NOTE — Progress Notes (Signed)
Pharmacy Antibiotic Note  Stephen MUNYON Sr. is a 78 y.o. male admitted on 04/18/2019 with sepsis.  Pharmacy has been consulted for Vancoycin dosing.  Plan: Vancomycin 1500 mg IV every 24 hours.  Goal trough 15-20 mcg/mL.  Monitor labs, c/s, and vanco levels as indicated.  Height: 5\' 6"  (167.6 cm) Weight: 227 lb 4.7 oz (103.1 kg) IBW/kg (Calculated) : 63.8  Temp (24hrs), Avg:100.5 F (38.1 C), Min:98.3 F (36.8 C), Max:102.7 F (39.3 C)  Recent Labs  Lab 04/25/19 0414 04/26/19 0558 04/27/19 0609 04/28/19 0757 04/29/19 0702  WBC 12.2* 12.8* 13.9* 12.1* 11.3*  CREATININE 1.30* 1.24 1.33* 2.01* 1.82*    Estimated Creatinine Clearance: 37 mL/min (A) (by C-G formula based on SCr of 1.82 mg/dL (H)).    No Known Allergies  Antimicrobials this admission: Vanco 6/7 >>  Zosyn 6/7 >>   Dose adjustments this admission: Vanco  Microbiology results: 6/7 BCx: pending 6/5 UCx: ng    Thank you for allowing pharmacy to be a part of this patient's care.  Ramond Craver 04/29/2019 4:08 PM

## 2019-04-29 NOTE — Progress Notes (Signed)
PROGRESS NOTE    Stephen HLAVATY Sr.  NKN:[id number] DOB: 1941/04/01 DOA: 04/18/2019 PCP: [name], MD   Brief Narrative:   78 y.o. male admitted with painless rectal bleeding.Pt was found to have a cholecolo fistula and taken to OR 6/1 for open cholecystectomy and partial colectomy.He is noted to have some continued weakness and barely participates with physical therapy. He is having some bowel movements.  He continues to remain fairly sedentary and is feeling weak.  He has not had much improvement in hemoglobin levels after PRBC transfusion on 6/6.    He was noted to have urine output of 1.2 L overnight on 6/6 and now has noted improving creatinine levels and is prerenal per lab work.  He continues to remain on IV fluid.  He was noted to have a large, bloody bowel movement on the morning of 6/7 and hemoglobin continues to downtrend.  2 units of PRBCs were ordered and general surgery reevaluation pending.  Assessment & Plan:   Principal Problem:   Rectal bleeding Active Problems:   Diabetes mellitus (HCC)   CKD (chronic kidney disease), stage III (HCC)   Anemia   History of cholecystitis   Calculus of gallbladder with cholecystitis without biliary obstruction   Choloenteric fistula   1. Postop day #6s/p open cholecystectomy and partial colectomy- Pt had colonscopy done 5/30 with findings of abnormal area of colon at hepatic flexure. He had a CT abdomen with findings of a fistula involving the gallbladder and colon. Surgery was consulted. He was taken to OR on 6/1. He is recoveringfairly slowly and tolerating diet with some bowel movement and flatus noted. He still continues to have some abdominal distention and ileus with leukocytosis noted this morning. Chest x-ray as well as urine analysis with no signs of infection and this appears to simply be related to some ileus and atelectasis.  Urine culture with no growth.  Patient noted to have worsening anemia over the last  2 days with bloody bowel movement this morning.  2 units PRBCs ordered for transfusion.  General surgery reevaluation currently pending. 2. Cholecolo fistula -s/p open chole and partial colectomy6/1/20. Pt now on diet and tolerating well. 3. Hyperkalemia -resolved. Follow-up in a.m. 4. Nonoliguric AKI.  Continue IV fluid and PRBC transfusions as ordered.  Patient appears to have great urine output with 1250 cc noted overnight.  Renal ultrasound with no hydronephrosis or mass noted.  Fractional excretion of sodium calculated at 0.1% and therefore, patient noted to be prerenal and continues to require volume.  This is improving on lab work and will repeat BMP in a.m.  Avoid nephrotoxic agents. 5. Mild sinus tachycardia-asymptomatic and improved. Restart home verapamil to start at bedtime. Metoprolol IV to be ordered as needed in the meantime. Okay to transfer to telemetry floor. 6. Acute blood loss anemia -1 unit PRBC transfusion transfused early on 6/6.  Continues to have overt bleeding and diminished hemoglobin levels for which additional 2 units have been ordered to transfuse on 6/7. 7. Dyslipidemia. Hold statin for now. 8. Hypertension. Restarted verapamil and will hold spironolactone, lisinopril, and hydralazine for now on account of AKI. Type 2 DM - continue sliding scale coverage and CBG testing.Improved control noted. Hold metformin and glipizide for now.   DVT prophylaxis:Lovenox now to heparin Code Status:Full Family Communication:Will plan to update daughter Disposition Plan:PT evaluation reveals need for SNF on discharge, but wants to go home with home health PT services once ready.  Evaluating AKI as noted above and  maintain on IV fluid.  Appreciate further general surgery recommendations.   Consultants:  GI  GS  Procedures: Impression: - One 4 mm polyp at the recto-sigmoid colon,  removed with a cold snare. Resected and  retrieved.  Colonic lipoma. Markedly abnormal area of colon at the hepatic  flexure of uncertain etiology. (Query fistula,  atypical presentation for infiltrating tumor or  possibly related to previously placed  cholecystostomy tube). This area is suspicious for  site of bleeding.  6/1-open cholecystectomy and partial colectomy  Antimicrobials:  Anti-infectives (From admission, onward)   Start     Dose/Rate Route Frequency Ordered Stop   04/27/19 1400  piperacillin-tazobactam (ZOSYN) IVPB 3.375 g     3.375 g 12.5 mL/hr over 240 Minutes Intravenous Every 8 hours 04/27/19 1015     04/24/19 1030  ertapenem (INVANZ) 1,000 mg in sodium chloride 0.9 % 50 mL IVPB  Status:  Discontinued     1 g 100 mL/hr over 30 Minutes Intravenous Every 24 hours 04/23/19 1426 04/27/19 1015   04/23/19 1000  ertapenem (INVANZ) 1,000 mg in sodium chloride 0.9 % 100 mL IVPB     1 g 200 mL/hr over 30 Minutes Intravenous  Once 04/23/19 0930 04/23/19 1345   04/22/19 1030  ertapenem (INVANZ) 1,000 mg in sodium chloride 0.9 % 100 mL IVPB     1 g 200 mL/hr over 30 Minutes Intravenous On call to O.R. 04/22/19 1021 04/23/19 0559        Subjective: Patient seen and evaluated today with no new acute complaints or concerns. No acute concerns or events noted overnight.  He continues to feel lethargic and had a large, bloody bowel movement this morning.  Objective: Vitals:   04/28/19 1432 04/28/19 2131 04/29/19 0520 04/29/19 0902  BP: (!) 141/66 132/70 132/71 122/60  Pulse: 86 92 87 89  Resp: 18 17 17 20   Temp: 99.9 F (37.7 C) 99.5 F (37.5 C) 98.3 F (36.8 C) 99.8 F (37.7 C)  TempSrc: Oral Oral  Oral  SpO2: 94% 93% 94% 93%  Weight:      Height:        Intake/Output Summary (Last 24 hours) at 04/29/2019 1135 Last data filed  at 04/29/2019 0909 Gross per 24 hour  Intake 1794.17 ml  Output 1250 ml  Net 544.17 ml   Filed Weights   04/23/19 1335 04/24/19 0500 04/25/19 0500  Weight: 90.7 kg 94.4 kg 98.1 kg    Examination:  General exam: Appears calm and comfortable, lethargic Respiratory system: Clear to auscultation. Respiratory effort normal.  Currently on nasal cannula Cardiovascular system: S1 & S2 heard, RRR. No JVD, murmurs, rubs, gallops or clicks. No pedal edema. Gastrointestinal system: Abdomen is moderately distended, soft and nontender. No organomegaly or masses felt. Normal bowel sounds heard.  Abdominal incision clean dry and intact. Central nervous system: Alert and oriented. No focal neurological deficits. Extremities: Symmetric 5 x 5 power. Skin: No rashes, lesions or ulcers Psychiatry: Difficult to evaluate.    Data Reviewed: I have personally reviewed following labs and imaging studies  CBC: Recent Labs  Lab 04/25/19 0414 04/26/19 0558 04/27/19 0609 04/28/19 0757 04/29/19 0702  WBC 12.2* 12.8* 13.9* 12.1* 11.3*  NEUTROABS  --   --   --   --  8.6*  HGB 7.3* 7.1* 7.5* 7.8* 7.3*  HCT 23.1* 23.0* 23.5* 24.3* 23.3*  MCV 94.3 94.3 94.0 90.3 89.6  PLT 207 218 245 254 932   Basic Metabolic Panel: Recent Labs  Lab  04/24/19 0435 04/25/19 0414 04/26/19 0558 04/27/19 0609 04/28/19 0757 04/29/19 0702  NA 136 137 137 138 138 139  K 5.4* 4.5 3.9 3.9 3.9 3.7  CL 106 108 107 106 105 107  CO2 20* 22 23 24 23 23   GLUCOSE 227* 138* 152* 151* 185* 211*  BUN 19 17 14 16  27* 26*  CREATININE 1.48* 1.30* 1.24 1.33* 2.01* 1.82*  CALCIUM 7.4* 7.1* 7.2* 7.4* 7.5* 7.6*  MG 1.5* 1.7 1.9 2.0 2.2  --   PHOS 3.4 2.5  --   --   --   --    GFR: Estimated Creatinine Clearance: 36.1 mL/min (A) (by C-G formula based on SCr of 1.82 mg/dL (H)). Liver Function Tests: Recent Labs  Lab 04/24/19 0435 04/25/19 0414 04/26/19 0558  AST 42* 32 25  ALT 47* 36 26  ALKPHOS 81 73 83  BILITOT 0.4 0.6 0.6   PROT 5.6* 5.2* 5.4*  ALBUMIN 2.8* 2.5* 2.4*   No results for input(s): LIPASE, AMYLASE in the last 168 hours. No results for input(s): AMMONIA in the last 168 hours. Coagulation Profile: No results for input(s): INR, PROTIME in the last 168 hours. Cardiac Enzymes: No results for input(s): CKTOTAL, CKMB, CKMBINDEX, TROPONINI in the last 168 hours. BNP (last 3 results) No results for input(s): PROBNP in the last 8760 hours. HbA1C: No results for input(s): HGBA1C in the last 72 hours. CBG: Recent Labs  Lab 04/28/19 1123 04/28/19 1605 04/28/19 2133 04/29/19 0729 04/29/19 1122  GLUCAP 157* 156* 145* 190* 206*   Lipid Profile: No results for input(s): CHOL, HDL, LDLCALC, TRIG, CHOLHDL, LDLDIRECT in the last 72 hours. Thyroid Function Tests: No results for input(s): TSH, T4TOTAL, FREET4, T3FREE, THYROIDAB in the last 72 hours. Anemia Panel: No results for input(s): VITAMINB12, FOLATE, FERRITIN, TIBC, IRON, RETICCTPCT in the last 72 hours. Sepsis Labs: No results for input(s): PROCALCITON, LATICACIDVEN in the last 168 hours.  Recent Results (from the past 240 hour(s))  Surgical pcr screen     Status: None   Collection Time: 04/22/19  7:33 AM  Result Value Ref Range Status   MRSA, PCR NEGATIVE NEGATIVE Final   Staphylococcus aureus NEGATIVE NEGATIVE Final    Comment: (NOTE) The Xpert SA Assay (FDA approved for NASAL specimens in patients 3 years of age and older), is one component of a comprehensive surveillance program. It is not intended to diagnose infection nor to guide or monitor treatment. Performed at [facility], [address] [place], [place] [address]   Culture, Urine     Status: None   Collection Time: 04/27/19 11:13 AM  Result Value Ref Range Status   Specimen Description   Final    URINE, CLEAN CATCH Performed at [facility], [address] [place], [place] [address]    Special Requests   Final    NONE Performed at [facility], [address],  [place], [place] [address]    Culture   Final    NO GROWTH Performed at [facility], [place] [address], [place], [place] [address]    Report Status 04/28/2019 FINAL  Final         Radiology Studies: US Renal  Result Date: 04/28/2019 CLINICAL DATA:  Acute renal insufficiency. Patient currently admitted. Diabetes. EXAM: RENAL / URINARY TRACT ULTRASOUND COMPLETE COMPARISON:  CT 04/19/2019 FINDINGS: Right Kidney: Renal measurements: 10.8 x 5.5 x 6.8 cm = volume: 210 mL . Echogenicity within normal limits. No mass or hydronephrosis visualized. 2.3 cm upper pole  cyst. Left Kidney: Renal measurements: 9.0 x 6.0 x 5.1 cm = volume: 144 mL. Echogenicity within normal limits. No mass or hydronephrosis visualized. 2.6 cm midpole cyst and 2.5 cm upper pole cyst. Bladder: Appears normal for degree of bladder distention. IMPRESSION: Normal size kidneys without hydronephrosis.  Bilateral renal cysts. Electronically Signed   By: Marin Olp M.D.   On: 04/28/2019 16:26   Dg Chest Port 1 View  Result Date: 04/27/2019 CLINICAL DATA:  Fever. EXAM: PORTABLE CHEST 1 VIEW COMPARISON:  10/21/2016. FINDINGS: Again demonstrated is a very poor inspiration. Mild-to-moderate bibasilar atelectasis. Mild peribronchial thickening. Stable borderline enlarged cardiac silhouette. Unremarkable bones. IMPRESSION: 1. Poor inspiration with mild to moderate bibasilar atelectasis. 2. Mild bronchitic changes. Electronically Signed   By: Claudie Revering M.D.   On: 04/27/2019 13:28        Scheduled Meds: . sodium chloride   Intravenous Once  . sodium chloride   Intravenous Once  . Chlorhexidine Gluconate Cloth  6 each Topical Q0600  . insulin aspart  0-15 Units Subcutaneous TID WC  . magnesium hydroxide  30 mL Oral BID  . magnesium oxide  400 mg Oral BID  . pantoprazole  40 mg Oral Q1200  . polyethylene glycol  17 g Oral Daily  . verapamil  240 mg Oral QHS   Continuous Infusions: . lactated ringers 100 mL/hr at 04/28/19 1221   . piperacillin-tazobactam 3.375 g (04/29/19 0847)     LOS: 9 days    Time spent: 30 minutes    Jaye Polidori Darleen Crocker, DO [facility] Pager [phone]  If 7PM-7AM, please contact night-coverage [email or web address] Password [email or web address] 04/29/2019, 11:35 AM

## 2019-04-29 NOTE — Progress Notes (Signed)
Approx 35cc of yellow drainage removed from L knee by MD Zammit.

## 2019-04-29 NOTE — Progress Notes (Signed)
Patient with large bloody stool. Dr [name] notified. Dr. Constance Haw to see patient. VSS.

## 2019-04-29 NOTE — Progress Notes (Signed)
EDP notified arthrocentesis is set up at this time, will arrive when available.

## 2019-04-29 NOTE — Progress Notes (Signed)
Educated patient on importance of mobility in recovery and overall outcome. Patient declining to get out of bed or dangle at this time. He currently is unable to move legs against gravity. He verbalizes understanding of education and expresses concerns about abdominal pain with movement. RN explained patient can have pain medication as needed.

## 2019-04-29 NOTE — Progress Notes (Signed)
Per lab, patient's units of PRBC are not ready and will require an extended cross match.

## 2019-04-29 NOTE — Progress Notes (Addendum)
Called regarding fevers of 102 Fahrenheit on patient and discussed case with Dr. Constance Haw of general surgery.  He was noted to have another large bloody bowel movement with some lethargy and therefore, evaluated patient at bedside.  He continues to have stable vital signs with elevated temperature readings despite oral Tylenol administration and I had told nursing staff earlier to give Tylenol suppository when the bleeding was discovered.  He is also noted to have a warm left knee with tenderness for which I will obtain x-ray as well as ESR and CRP and ask for joint arthrocentesis.  Ordered INR level as suggested by general surgery and will transfer patient to stepdown unit for closer monitoring.  Further PRBC transfusion as ordered is currently pending.  Will monitor lactic acid as well as procalcitonin levels and add vancomycin empirically on top of Zosyn.  Continue IV fluid for now and bolus as needed.  Per general surgery, the need for any acute intervention at this point in time is very unlikely.  Will consider GI evaluation for possible colonoscopy if he continues to have ongoing bleeding overnight.  Discontinued magnesium supplementation and decreased verapamil to half dose.  PPI IV twice daily added and patient n.p.o. except medications and sips.  Will try Tylenol suppository as well as cooling blankets to control temperature.  Will reattempt PRBC transfusion once further units available.  Discussed case with Dr. Roderic Palau who has graciously agreed to attempt arthrocentesis of the left knee once equipment/tray can be set up at bedside.  Critical care time of approximately 30 minutes spent well evaluating and managing this patient.

## 2019-04-29 NOTE — ED Provider Notes (Signed)
I was called by the hospitalist to do a tap of the left knee.  Procedure.  Left knee was swollen and tender.  Area was cleaned thoroughly with Betadine.  1% lidocaine without epi was used to anesthetize the skin.  An 18-gauge needle was used with a 30 cc syringe to remove fluid from his knee.  30 cc of straw-colored fluid was removed.  Patient tolerated the procedure well and the hospitalist sent the fluid for analysis   Milton Ferguson, MD 04/29/19 1750

## 2019-04-29 NOTE — Progress Notes (Signed)
[facility]  Patient with fever with blood. Up to 102. Will need to premedication with benadryl and tylenol. No other symptoms of respiratory distress or flank pain, etc. Will give meds and see if temperature will go back down to 99 where it was prior and then resume. spoke blood immediately if other symptoms occur. Will notify Dr. Manuella Ghazi also.   Curlene Labrum, MD [facility] [address] [place], [place] [address] [phone] (office)

## 2019-04-30 ENCOUNTER — Inpatient Hospital Stay (HOSPITAL_COMMUNITY): Payer: Medicare HMO

## 2019-04-30 LAB — GLUCOSE, CAPILLARY
Glucose-Capillary: 137 mg/dL — ABNORMAL HIGH (ref 70–99)
Glucose-Capillary: 137 mg/dL — ABNORMAL HIGH (ref 70–99)
Glucose-Capillary: 300 mg/dL — ABNORMAL HIGH (ref 70–99)
Glucose-Capillary: 177 mg/dL — ABNORMAL HIGH (ref 70–99)

## 2019-04-30 LAB — BASIC METABOLIC PANEL
Anion gap: 6 (ref 5–15)
BUN: 23 mg/dL (ref 8–23)
CO2: 25 mmol/L (ref 22–32)
Calcium: 7.4 mg/dL — ABNORMAL LOW (ref 8.9–10.3)
Chloride: 109 mmol/L (ref 98–111)
Creatinine, Ser: 1.53 mg/dL — ABNORMAL HIGH (ref 0.61–1.24)
GFR calc Af Amer: 49 mL/min — ABNORMAL LOW (ref >60)
GFR calc non Af Amer: 43 mL/min — ABNORMAL LOW (ref >60)
Glucose, Bld: 144 mg/dL — ABNORMAL HIGH (ref 70–99)
Potassium: 3.5 mmol/L (ref 3.5–5.1)
Sodium: 140 mmol/L (ref 135–145)

## 2019-04-30 LAB — PROCALCITONIN: Procalcitonin: 1.34 ng/mL

## 2019-04-30 LAB — HEMOGLOBIN AND HEMATOCRIT, BLOOD
HCT: 30.9 % — ABNORMAL LOW (ref 39.0–52.0)
Hemoglobin: 10.1 g/dL — ABNORMAL LOW (ref 13.0–17.0)

## 2019-04-30 LAB — CBC
HCT: 23.1 % — ABNORMAL LOW (ref 39.0–52.0)
Hemoglobin: 7.4 g/dL — ABNORMAL LOW (ref 13.0–17.0)
MCH: 28.8 pg (ref 26.0–34.0)
MCHC: 32 g/dL (ref 30.0–36.0)
MCV: 89.9 fL (ref 80.0–100.0)
Platelets: 286 10*3/uL (ref 150–400)
RBC: 2.57 MIL/uL — ABNORMAL LOW (ref 4.22–5.81)
RDW: 15.4 % (ref 11.5–15.5)
WBC: 12.6 10*3/uL — ABNORMAL HIGH (ref 4.0–10.5)
nRBC: 0 % (ref 0.0–0.2)

## 2019-04-30 LAB — TRANSFUSION REACTION
DAT C3: NEGATIVE
Post RXN DAT IgG: NEGATIVE

## 2019-04-30 LAB — PREPARE RBC (CROSSMATCH)

## 2019-04-30 LAB — MAGNESIUM: Magnesium: 2.4 mg/dL (ref 1.7–2.4)

## 2019-04-30 MED ORDER — ADULT MULTIVITAMIN W/MINERALS CH
1.0000 | ORAL_TABLET | Freq: Every day | ORAL | Status: DC
  Administered 2019-04-30 – 2019-05-03 (×4): 1 via ORAL
  Filled 2019-04-30 (×4): qty 1

## 2019-04-30 MED ORDER — BOOST / RESOURCE BREEZE PO LIQD CUSTOM
1.0000 | Freq: Three times a day (TID) | ORAL | Status: DC
  Administered 2019-04-30 – 2019-05-03 (×6): 1 via ORAL

## 2019-04-30 MED ORDER — ORAL CARE MOUTH RINSE
15.0000 mL | Freq: Two times a day (BID) | OROMUCOSAL | Status: DC
  Administered 2019-04-30 – 2019-05-03 (×8): 15 mL via OROMUCOSAL

## 2019-04-30 MED ORDER — SODIUM CHLORIDE 0.9% IV SOLUTION
Freq: Once | INTRAVENOUS | Status: AC
  Administered 2019-04-30: 11:00:00 via INTRAVENOUS

## 2019-04-30 MED ORDER — PRO-STAT SUGAR FREE PO LIQD
30.0000 mL | Freq: Three times a day (TID) | ORAL | Status: DC
  Administered 2019-04-30 – 2019-05-03 (×9): 30 mL via ORAL
  Filled 2019-04-30 (×9): qty 30

## 2019-04-30 NOTE — Care Management Important Message (Signed)
Important Message  Patient Details  Name: Stephen Rogers. MRN: [id number] Date of Birth: 11-01-1941   Medicare Important Message Given:  Yes    Tommy Medal 04/30/2019, 1:24 PM

## 2019-04-30 NOTE — Progress Notes (Signed)
Pt had large dark bloody stool this am.

## 2019-04-30 NOTE — Progress Notes (Signed)
Patient on 4 lpm hfnc he probably has some sleep apnea as he was asleep. He also is up from original wt 90.7 kg to 103.1 kg --- 8975 cc positive. By hospital count.

## 2019-04-30 NOTE — TOC Progression Note (Signed)
Transition of Care (TOC) - Progression Note    Patient Details  Name: Stephen DANIELLO Sr. MRN: [id number] Date of Birth: 02-11-1941  Transition of Care [facility]) CM/SW Contact  Shade Flood, LCSW Phone Number: 04/30/2019, 10:08 AM  Clinical Narrative:     Pt reviewed with MD in Progression today. Medical plan of care continues. Pt not stable for dc. Plan at this time is home with [facility] at dc unless pt changes his mind about going for SNF rehab. TOC will follow and assist.  Expected Discharge Plan: [place] Barriers to Discharge: Continued Medical Work up  Expected Discharge Plan and Services Expected Discharge Plan: [place] In-house Referral: Clinical Social Work Discharge Planning Services: CM Consult Post Acute Care Choice: [place] arrangements for the past 2 months: Single Family Home                           HH Arranged: RN, PT [place] Agency: [facility] [employer]) Date [place]: 04/25/19 Time [place]: [place] Representative spoke with at [place]: [place] ([place]) Interventions    Readmission Risk Interventions Readmission Risk Prevention Plan 04/25/2019  Transportation Screening Complete  Home Care Screening Complete  Some recent data might be hidden

## 2019-04-30 NOTE — Progress Notes (Addendum)
Initial Nutrition Assessment  DOCUMENTATION CODES:      INTERVENTION:  Boost Breeze po TID, each supplement provides 250 kcal and 9 grams of protein   ProStat 30 ml TID (each 30 ml provides 100 kcal, 15 gr protein)   Multivitamin daily   NUTRITION DIAGNOSIS:   Inadequate oral intake related to decreased appetite-s/p open cholecystectomy and partial colectomy) as evidenced by meal completion < 50%.   GOAL:   Patient will meet greater than or equal to 90% of their needs  MONITOR:   PO intake, Supplement acceptance, Weight trends, Labs  REASON FOR ASSESSMENT:   LOS   ASSESSMENT: Patient is an obese 78 yo male who presented with rectal bleeding.  A large bloody stool reported by RN this morning. PRBC's 2 units.  S/p open cholecystectomy and partial colectomy last Monday.  His history includes DM, CKD-3 and HTN. Patient also has a swollen left knee from which 30 cc of fluid was removed yesterday.  Patient intake is 0-50% of most meals. His has been liquids or NPO most of his 10 day hospitalization. Able to feed himself. Patient feels that he will eat better once he is able to fully advance his diet. Encouraged pt to consume oral supplements while he is awaiting clearance to have his usual diet.   He has experienced severe weight gain from 91.2 kg on admission up to 101.9 kg currently. Patient says he has weighed around 200 lb for several years. Mild to moderate edema upper and lower extremities. Partial physical exam completed. No severe changes noted but his significant wt gain may be masking loss.   Intake/Output Summary (Last 24 hours) at 04/30/2019 1347 Last data filed at 04/30/2019 1322 Gross per 24 hour  Intake 2307.37 ml  Output 725 ml  Net 1582.37 ml     Medications reviewed and include: Novolog, Protonix Miralax   IVF-lactated ringers @ 100 ml/hr  Labs: BMP Latest Ref Rng & Units 04/30/2019 04/29/2019 04/28/2019  Glucose 70 - 99 mg/dL 144(H) 211(H) 185(H)  BUN 8 - 23  mg/dL 23 26(H) 27(H)  Creatinine 0.61 - 1.24 mg/dL 1.53(H) 1.82(H) 2.01(H)  Sodium 135 - 145 mmol/L 140 139 138  Potassium 3.5 - 5.1 mmol/L 3.5 3.7 3.9  Chloride 98 - 111 mmol/L 109 107 105  CO2 22 - 32 mmol/L 25 23 23   Calcium 8.9 - 10.3 mg/dL 7.4(L) 7.6(L) 7.5(L)     NUTRITION - FOCUSED PHYSICAL EXAM:    Most Recent Value  Orbital Region  No depletion  Upper Arm Region  No depletion  Thoracic and Lumbar Region  No depletion  Buccal Region  No depletion  Temple Region  No depletion  Clavicle Bone Region  Mild depletion  Clavicle and Acromion Bone Region  Mild depletion  Dorsal Hand  No depletion  Hair  Reviewed  Eyes  Reviewed  Mouth  Unable to assess  Skin  Reviewed  Nails  Reviewed      Diet Order:   Diet Order            Diet clear liquid Room service appropriate? Yes; Fluid consistency: Thin  Diet effective now              EDUCATION NEEDS:   No education needs have been identified at this time Skin:  Skin Assessment: Reviewed RN Assessment  Last BM:  6/8 large bloody stool  Height:   Ht Readings from Last 1 Encounters:  04/29/19 5\' 6"  (1.676 m)    Weight:  Wt Readings from Last 1 Encounters:  04/30/19 101.9 kg    Ideal Body Weight:  65 kg  BMI:  Body mass index is 36.26 kg/m.  Estimated Nutritional Needs: Based on admission weight 91 kg  Kcal:  6979-4801  Protein:  98-111 gr  Fluid:  1.9-2.2 liters daily   Colman Cater MS,RD,CSG,LDN Office: [phone] Pager: [phone]

## 2019-04-30 NOTE — Progress Notes (Signed)
7 Days Post-Op  Subjective: Patient had large bloody bowel movement this morning.  Denies any abdominal pain.  Objective: Vital signs in last 24 hours: Temp:  [98.1 F (36.7 C)-102.7 F (39.3 C)] 98.1 F (36.7 C) (06/08 1100) Pulse Rate:  [66-88] 71 (06/08 1115) Resp:  [12-21] 14 (06/08 1115) BP: (100-148)/(56-88) 100/61 (06/08 1115) SpO2:  [75 %-99 %] 95 % (06/08 1115) Weight:  [101.9 kg-103.1 kg] 101.9 kg (06/08 0500) Last BM Date: 04/30/19  Intake/Output from previous day: 06/07 0701 - 06/08 0700 In: 1734.3 [P.O.:700; I.V.:475.3; Blood:28; IV Piggyback:531] Out: 725 [Urine:725] Intake/Output this shift: Total I/O In: 255.5 [I.V.:248.4; IV Piggyback:7] Out: -   General appearance: alert, cooperative, fatigued and no distress GI: Soft, incision healing well.  Nontender.  Nondistended.  Lab Results:  Recent Labs    04/29/19 0702 04/29/19 2200 04/30/19 0539  WBC 11.3*  --  12.6*  HGB 7.3* 8.8* 7.4*  HCT 23.3* 27.0* 23.1*  PLT 264  --  286   BMET Recent Labs    04/29/19 0702 04/30/19 0539  NA 139 140  K 3.7 3.5  CL 107 109  CO2 23 25  GLUCOSE 211* 144*  BUN 26* 23  CREATININE 1.82* 1.53*  CALCIUM 7.6* 7.4*   PT/INR Recent Labs    04/29/19 1642  LABPROT 14.9  INR 1.2    Studies/Results: Dg Knee 1-2 Views Left  Result Date: 04/29/2019 CLINICAL DATA:  Left knee pain and swelling. Fluid aspirated from knee today. Daughter. EXAM: LEFT KNEE - 1-2 VIEW COMPARISON:  None. FINDINGS: Mild to moderate tricompartmental osteoarthritic changes present. There is evidence of a small joint effusion. No evidence of fracture or dislocation. IMPRESSION: No acute findings. Small joint effusion.  Mild to moderate osteoarthritis. Electronically Signed   By: Marin Olp M.D.   On: 04/29/2019 18:17   US Renal  Result Date: 04/28/2019 CLINICAL DATA:  Acute renal insufficiency. Patient currently admitted. Diabetes. EXAM: RENAL / URINARY TRACT ULTRASOUND COMPLETE COMPARISON:   CT 04/19/2019 FINDINGS: Right Kidney: Renal measurements: 10.8 x 5.5 x 6.8 cm = volume: 210 mL . Echogenicity within normal limits. No mass or hydronephrosis visualized. 2.3 cm upper pole cyst. Left Kidney: Renal measurements: 9.0 x 6.0 x 5.1 cm = volume: 144 mL. Echogenicity within normal limits. No mass or hydronephrosis visualized. 2.6 cm midpole cyst and 2.5 cm upper pole cyst. Bladder: Appears normal for degree of bladder distention. IMPRESSION: Normal size kidneys without hydronephrosis.  Bilateral renal cysts. Electronically Signed   By: Marin Olp M.D.   On: 04/28/2019 16:26    Anti-infectives: Anti-infectives (From admission, onward)   Start     Dose/Rate Route Frequency Ordered Stop   04/30/19 1600  vancomycin (VANCOCIN) 1,500 mg in sodium chloride 0.9 % 500 mL IVPB     1,500 mg 250 mL/hr over 120 Minutes Intravenous Every 24 hours 04/29/19 1558     04/29/19 1600  vancomycin (VANCOCIN) IVPB 1000 mg/200 mL premix     1,000 mg 200 mL/hr over 60 Minutes Intravenous Every 1 hr x 2 04/29/19 1456 04/29/19 1802   04/27/19 1400  piperacillin-tazobactam (ZOSYN) IVPB 3.375 g     3.375 g 12.5 mL/hr over 240 Minutes Intravenous Every 8 hours 04/27/19 1015     04/24/19 1030  ertapenem (INVANZ) 1,000 mg in sodium chloride 0.9 % 50 mL IVPB  Status:  Discontinued     1 g 100 mL/hr over 30 Minutes Intravenous Every 24 hours 04/23/19 1426 04/27/19 1015  04/23/19 1000  ertapenem (INVANZ) 1,000 mg in sodium chloride 0.9 % 100 mL IVPB     1 g 200 mL/hr over 30 Minutes Intravenous  Once 04/23/19 0930 04/23/19 1345   04/22/19 1030  ertapenem (INVANZ) 1,000 mg in sodium chloride 0.9 % 100 mL IVPB     1 g 200 mL/hr over 30 Minutes Intravenous On call to O.R. 04/22/19 1021 04/23/19 0559      Assessment/Plan: s/p Procedure(s): CHOLECYSTECTOMY PARTIAL COLECTOMY Impression: Postoperative day 7.  Patient having intermittent bloody bowel movements.  The blood this morning appeared old in nature.  His  subcutaneous heparin was stopped yesterday.  He did receive multiple units of packed red blood cells, with his hemoglobin relatively unchanged this morning.  He does have multiple antibodies in the type and cross.  At this point, we will continue to watch his hemoglobin over the next 24 to 48 hours.  Anastomotic bleeding should spontaneously resolve.  I do not want to do a colonoscopy as this could be disruptive to the anastomosis.  We will start clear liquid diet.  Have stopped MiraLAX.  Discussed with his sister.  LOS: 10 days    Aviva Signs 04/30/2019

## 2019-04-30 NOTE — Progress Notes (Signed)
Pts IS was on the other side of the room where patient couldn't reach. Given to patient and re-educated on use. Pt refused to use IS and stated it hurt. Rn offered pain medication to patient, refused again. RN adv pt that if he didn't get up to move around or use the IS, his hospital stay would be even longer and cause other problems.  Pt did not say anything after being told this.

## 2019-04-30 NOTE — Progress Notes (Signed)
Pt refusing boost d/t it increasing his blood sugar to 300

## 2019-04-30 NOTE — Progress Notes (Signed)
PT Cancellation Note  Patient Details Name: Stephen REMMERS Sr. MRN: [id number] DOB: 10-20-1941   Cancelled Treatment:    Reason Eval/Treat Not Completed: Medical issues which prohibited therapy.  Patient transferred to a higher level of care and will need new PT consult resume therapy when patient is medically stable.  Thank you.   2:39 PM, 04/30/19 Lonell Grandchild, MPT Physical Therapist with [facility] [phone] office [phone] mobile phone

## 2019-04-30 NOTE — Progress Notes (Signed)
Pts oxygen saturations keep getting to 40s-70s while sleeping. RN went in room and sternal rubbed patient for 20 seconds before patient would wake up.  Respiratory placed pt on HFNC.

## 2019-04-30 NOTE — Progress Notes (Signed)
PROGRESS NOTE    Stephen BERTZ Sr.  BJS:[id number] DOB: 08-06-1941 DOA: 04/18/2019 PCP: [name], MD   Brief Narrative:   78 y.o. male admitted with painless rectal bleeding.Pt was found to have a cholecolo fistula and taken to OR 6/1 for open cholecystectomy and partial colectomy.He is noted to have some continued weakness and barely participates with physical therapy. He is having some bowel movements.He continues to remain fairly sedentary and is feeling weak. He has not had much improvement in hemoglobin levels after PRBC transfusion on 6/6.  He was noted to have urine output of 1.2 L overnight on 6/6 and now has noted improving creatinine levels and is prerenal per lab work.  He continues to remain on IV fluid.  He was noted to have a large, bloody bowel movement on the morning of 6/7 and hemoglobin continues to downtrend.  2 units of PRBCs were ordered and general surgery reevaluation suggesting possibility of anastomotic bleed that should spontaneously resolve.  Will repeat 2 unit PRBC transfusion on 6/8.  Left knee arthrocentesis also performed on 6/7 with findings of inflammatory osteoarthritis, but no significant septic arthritis or gout.  Assessment & Plan:   Principal Problem:   Rectal bleeding Active Problems:   Diabetes mellitus (HCC)   CKD (chronic kidney disease), stage III (HCC)   Anemia   History of cholecystitis   Calculus of gallbladder with cholecystitis without biliary obstruction   Choloenteric fistula   1. Postop day #7s/p open cholecystectomy and partial colectomy- Pt had colonscopy done 5/30 with findings of abnormal area of colon at hepatic flexure. He had a CT abdomen with findings of a fistula involving the gallbladder and colon. Surgery was consulted. He was taken to OR on 6/1. He is recoveringfairly slowly and tolerating diet with some bowel movement and flatus noted. He still continues to have some abdominal distention and ileus with  leukocytosis noted this morning. Chest x-ray as well as urine analysiswith no signs of infection and this appears to simply be related to some ileus and atelectasis. Urine culture with no growth.  Patient noted to have worsening anemia over the last 2 days with bloody bowel movement this morning.  2 units PRBCs ordered for transfusion.  General surgery reevaluation appreciated with possibility of anastomotic bleed that may spontaneously resolve.  Repeat 2 units of PRBCs on 6/8. 2. Fever-resolving.  Unknown source at this time.  Maintain on current antibiotics and monitor cultures. 3. Cholecolo fistula -s/p open chole and partial colectomy6/1/20. Patient started back on clear liquids today.  Will maintain on PPI IV twice daily through today and likely convert to oral p.o. Protonix by a.m. 4. Hyperkalemia -resolved. Follow-up in a.m. 5. Nonoliguric AKI.  Continue IV fluid and PRBC transfusions as ordered.  Patient appears to have great urine output with 1250 cc noted overnight.  Renal ultrasound with no hydronephrosis or mass noted.  Fractional excretion of sodium calculated at 0.1% and therefore, patient noted to be prerenal and continues to require volume.  This is improving on lab work and will repeat BMP in a.m.  Avoid nephrotoxic agents. 6. Mild sinus tachycardia-asymptomatic and improved. Restart home verapamil to start at bedtime. Metoprolol IV to be ordered as needed in the meantime. Okay to transfer to telemetry floor. 7. Acute blood loss anemia -1 unit PRBC transfusiontransfused early on 6/6.  Continues to have overt bleeding and diminished hemoglobin levels for which additional 2 units have been ordered to transfuse on 6/7.  Another 2 units will  be ordered to transfuse him 6/8 given ongoing bloody bowel movements and low hemoglobin levels. 8. Dyslipidemia. Hold statin for now. 9. Left knee arthritis.  Gram stain with no organisms noted and cultures pending.  No crystals noted in fluid.   Appreciate EDP for arthrocentesis on 6/7.  Likely osteoarthritis flare. 10. Hypertension. Restarted verapamil and will hold spironolactone, lisinopril, and hydralazine for now on account of AKI.  Verapamil at half dose currently. Type 2 DM - continue sliding scale coverage and CBG testing.Improved control noted. Hold metformin and glipizide for now.   DVT prophylaxis: Currently SCDs Code Status:Full Family Communication:Sister updated by general surgery Disposition Plan:PT evaluation reveals need for SNF on discharge, but wants to go home with home health PT services once ready. Continue IV fluid and PRBC transfusions and monitor in stepdown unit.  Continues to have large bloody bowel movements.  Ongoing fever.   Consultants:  GI  GS  Procedures: Impression: - One 4 mm polyp at the recto-sigmoid colon,  removed with a cold snare. Resected and retrieved.  Colonic lipoma. Markedly abnormal area of colon at the hepatic  flexure of uncertain etiology. (Query fistula,  atypical presentation for infiltrating tumor or  possibly related to previously placed  cholecystostomy tube). This area is suspicious for  site of bleeding.  6/1-open cholecystectomy and partial colectomy  Antimicrobials:  Anti-infectives (From admission, onward)   Start     Dose/Rate Route Frequency Ordered Stop   04/30/19 1600  vancomycin (VANCOCIN) 1,500 mg in sodium chloride 0.9 % 500 mL IVPB     1,500 mg 250 mL/hr over 120 Minutes Intravenous Every 24 hours 04/29/19 1558     04/29/19 1600  vancomycin (VANCOCIN) IVPB 1000 mg/200 mL premix     1,000 mg 200 mL/hr over 60 Minutes Intravenous Every 1 hr x 2 04/29/19 1456 04/29/19 1802   04/27/19 1400  piperacillin-tazobactam (ZOSYN) IVPB  3.375 g     3.375 g 12.5 mL/hr over 240 Minutes Intravenous Every 8 hours 04/27/19 1015     04/24/19 1030  ertapenem (INVANZ) 1,000 mg in sodium chloride 0.9 % 50 mL IVPB  Status:  Discontinued     1 g 100 mL/hr over 30 Minutes Intravenous Every 24 hours 04/23/19 1426 04/27/19 1015   04/23/19 1000  ertapenem (INVANZ) 1,000 mg in sodium chloride 0.9 % 100 mL IVPB     1 g 200 mL/hr over 30 Minutes Intravenous  Once 04/23/19 0930 04/23/19 1345   04/22/19 1030  ertapenem (INVANZ) 1,000 mg in sodium chloride 0.9 % 100 mL IVPB     1 g 200 mL/hr over 30 Minutes Intravenous On call to O.R. 04/22/19 1021 04/23/19 0559       Subjective: Patient seen and evaluated today with no new acute complaints or concerns. No acute concerns or events noted overnight.  He did have a large, bloody bowel movement noted this morning.  Objective: Vitals:   04/30/19 1000 04/30/19 1059 04/30/19 1100 04/30/19 1115  BP: 126/62  (!) 112/59 100/61  Pulse: 74 70 66 71  Resp: 12 13 14 14   Temp:   98.1 F (36.7 C)   TempSrc:   Rectal   SpO2: 99% 98% 97% 95%  Weight:      Height:        Intake/Output Summary (Last 24 hours) at 04/30/2019 1149 Last data filed at 04/30/2019 1100 Gross per 24 hour  Intake 2064.77 ml  Output 725 ml  Net 1339.77 ml   [employer]  04/25/19 0500 04/29/19 1518 04/30/19 0500  Weight: 98.1 kg 103.1 kg 101.9 kg    Examination:  General exam: Appears calm and comfortable, afebrile Respiratory system: Clear to auscultation. Respiratory effort normal.  Currently on 4 L nasal cannula Cardiovascular system: S1 & S2 heard, RRR. No JVD, murmurs, rubs, gallops or clicks. No pedal edema. Gastrointestinal system: Abdomen is mildly distended, soft and nontender. No organomegaly or masses felt. Normal bowel sounds heard.  Incision is clean dry and intact. Central nervous system: Alert and oriented. No focal neurological deficits. Extremities: Symmetric 5 x 5 power. Skin: No rashes, lesions  or ulcers Psychiatry: Judgement and insight appear normal. Mood & affect appropriate. Condom catheter with cloudy urine present.    Data Reviewed: I have personally reviewed following labs and imaging studies  CBC: Recent Labs  Lab 04/26/19 0558 04/27/19 0609 04/28/19 0757 04/29/19 0702 04/29/19 2200 04/30/19 0539  WBC 12.8* 13.9* 12.1* 11.3*  --  12.6*  NEUTROABS  --   --   --  8.6*  --   --   HGB 7.1* 7.5* 7.8* 7.3* 8.8* 7.4*  HCT 23.0* 23.5* 24.3* 23.3* 27.0* 23.1*  MCV 94.3 94.0 90.3 89.6  --  89.9  PLT 218 245 254 264  --  616   Basic Metabolic Panel: Recent Labs  Lab 04/24/19 0435 04/25/19 0414 04/26/19 0558 04/27/19 0609 04/28/19 0757 04/29/19 0702 04/30/19 0539  NA 136 137 137 138 138 139 140  K 5.4* 4.5 3.9 3.9 3.9 3.7 3.5  CL 106 108 107 106 105 107 109  CO2 20* 22 23 24 23 23 25   GLUCOSE 227* 138* 152* 151* 185* 211* 144*  BUN 19 17 14 16  27* 26* 23  CREATININE 1.48* 1.30* 1.24 1.33* 2.01* 1.82* 1.53*  CALCIUM 7.4* 7.1* 7.2* 7.4* 7.5* 7.6* 7.4*  MG 1.5* 1.7 1.9 2.0 2.2  --  2.4  PHOS 3.4 2.5  --   --   --   --   --    GFR: Estimated Creatinine Clearance: 43.7 mL/min (A) (by C-G formula based on SCr of 1.53 mg/dL (H)). Liver Function Tests: Recent Labs  Lab 04/24/19 0435 04/25/19 0414 04/26/19 0558  AST 42* 32 25  ALT 47* 36 26  ALKPHOS 81 73 83  BILITOT 0.4 0.6 0.6  PROT 5.6* 5.2* 5.4*  ALBUMIN 2.8* 2.5* 2.4*   No results for input(s): LIPASE, AMYLASE in the last 168 hours. No results for input(s): AMMONIA in the last 168 hours. Coagulation Profile: Recent Labs  Lab 04/29/19 1642  INR 1.2   Cardiac Enzymes: No results for input(s): CKTOTAL, CKMB, CKMBINDEX, TROPONINI in the last 168 hours. BNP (last 3 results) No results for input(s): PROBNP in the last 8760 hours. HbA1C: No results for input(s): HGBA1C in the last 72 hours. CBG: Recent Labs  Lab 04/29/19 1122 04/29/19 1736 04/29/19 2124 04/30/19 0804 04/30/19 1120  GLUCAP  206* 214* 134* 137* 137*   Lipid Profile: No results for input(s): CHOL, HDL, LDLCALC, TRIG, CHOLHDL, LDLDIRECT in the last 72 hours. Thyroid Function Tests: No results for input(s): TSH, T4TOTAL, FREET4, T3FREE, THYROIDAB in the last 72 hours. Anemia Panel: No results for input(s): VITAMINB12, FOLATE, FERRITIN, TIBC, IRON, RETICCTPCT in the last 72 hours. Sepsis Labs: Recent Labs  Lab 04/29/19 1642 04/29/19 2200 04/30/19 0539  PROCALCITON 1.71  --  1.34  LATICACIDVEN 1.3 1.5  --     Recent Results (from the past 240 hour(s))  Surgical pcr screen  Status: None   Collection Time: 04/22/19  7:33 AM  Result Value Ref Range Status   MRSA, PCR NEGATIVE NEGATIVE Final   Staphylococcus aureus NEGATIVE NEGATIVE Final    Comment: (NOTE) The Xpert SA Assay (FDA approved for NASAL specimens in patients 35 years of age and older), is one component of a comprehensive surveillance program. It is not intended to diagnose infection nor to guide or monitor treatment. Performed at [facility], [address] [place], [place] [address]   Culture, Urine     Status: None   Collection Time: 04/27/19 11:13 AM  Result Value Ref Range Status   Specimen Description   Final    URINE, CLEAN CATCH Performed at [facility], [address], [place], [place] [address]    Special Requests   Final    NONE Performed at [facility], [address], [place], [place] [address]    Culture   Final    NO GROWTH Performed at [facility], [place] [address] [place], [place] [address]    Report Status 04/28/2019 FINAL  Final  Culture, blood (routine x 2)     Status: None (Preliminary result)   Collection Time: 04/29/19  2:53 PM  Result Value Ref Range Status   Specimen Description   Final    BLOOD RIGHT ARM BOTTLES DRAWN AEROBIC AND ANAEROBIC   Special Requests Blood Culture adequate volume  Final   Culture   Final    NO GROWTH < 24 HOURS Performed at [facility], [address], [place],  [place] [address]    Report Status PENDING  Incomplete  Culture, blood (routine x 2)     Status: None (Preliminary result)   Collection Time: 04/29/19  2:54 PM  Result Value Ref Range Status   Specimen Description BLOOD  Final   Special Requests NONE  Final   Culture   Final    NO GROWTH < 24 HOURS Performed at [facility], [address], [place], [place] [address]    Report Status PENDING  Incomplete  Stat Gram stain     Status: None   Collection Time: 04/29/19  4:27 PM  Result Value Ref Range Status   Specimen Description KNEE  Final   Special Requests Normal  Final   Gram Stain   Final    CYTOSPIN SMEAR NO ORGANISMS SEEN WBC PRESENT, PREDOMINANTLY PMN Performed at [facility], [address] [place], [place] [address]    Report Status 04/29/2019 FINAL  Final  Culture, body fluid-bottle     Status: None (Preliminary result)   Collection Time: 04/29/19  4:27 PM  Result Value Ref Range Status   Specimen Description SYNOVIAL BOTTLES DRAWN AEROBIC AND ANAEROBIC  Final   Special Requests 10CC  Final   Culture   Final    NO GROWTH < 24 HOURS Performed at [facility], [address] [place],  [address]    Report Status PENDING  Incomplete         Radiology Studies: Dg Knee 1-2 Views Left  Result Date: 04/29/2019 CLINICAL DATA:  Left knee pain and swelling. Fluid aspirated from knee today. Daughter. EXAM: LEFT KNEE - 1-2 VIEW COMPARISON:  None. FINDINGS: Mild to moderate tricompartmental osteoarthritic changes present. There is evidence of a small joint effusion. No evidence of fracture or dislocation. IMPRESSION: No acute findings. Small joint effusion.  Mild to moderate osteoarthritis. Electronically Signed   By: Marin Olp M.D.   On: 04/29/2019 18:17  US Renal  Result Date: 04/28/2019 CLINICAL DATA:  Acute renal insufficiency. Patient currently admitted. Diabetes. EXAM: RENAL / URINARY TRACT ULTRASOUND COMPLETE COMPARISON:  CT 04/19/2019 FINDINGS: Right Kidney: Renal  measurements: 10.8 x 5.5 x 6.8 cm = volume: 210 mL . Echogenicity within normal limits. No mass or hydronephrosis visualized. 2.3 cm upper pole cyst. Left Kidney: Renal measurements: 9.0 x 6.0 x 5.1 cm = volume: 144 mL. Echogenicity within normal limits. No mass or hydronephrosis visualized. 2.6 cm midpole cyst and 2.5 cm upper pole cyst. Bladder: Appears normal for degree of bladder distention. IMPRESSION: Normal size kidneys without hydronephrosis.  Bilateral renal cysts. Electronically Signed   By: Marin Olp M.D.   On: 04/28/2019 16:26        Scheduled Meds: . Chlorhexidine Gluconate Cloth  6 each Topical Q0600  . insulin aspart  0-15 Units Subcutaneous TID WC  . mouth rinse  15 mL Mouth Rinse BID  . pantoprazole (PROTONIX) IV  40 mg Intravenous Q12H  . verapamil  120 mg Oral QHS   Continuous Infusions: . lactated ringers 100 mL/hr at 04/30/19 1002  . piperacillin-tazobactam 3.375 g (04/30/19 1048)  . vancomycin       LOS: 10 days    Time spent: 30 minutes    Pratik Darleen Crocker, DO [facility] Pager [phone]  If 7PM-7AM, please contact night-coverage [email or web address] Password [facility] 04/30/2019, 11:49 AM

## 2019-05-01 LAB — TYPE AND SCREEN
ABO/RH(D): B POS
Antibody Screen: POSITIVE
Unit division: 0
Unit division: 0
Unit division: 0
Unit division: 0
Unit division: 0

## 2019-05-01 LAB — BPAM RBC
Blood Product Expiration Date: 202006102359
Blood Product Expiration Date: 202006142359
Blood Product Expiration Date: 202006202359
Blood Product Expiration Date: 202007082359
Blood Product Expiration Date: 202007092359
ISSUE DATE / TIME: 202006060317
ISSUE DATE / TIME: 202006071206
ISSUE DATE / TIME: 202006071705
ISSUE DATE / TIME: 202006081054
ISSUE DATE / TIME: 202006081318
Unit Type and Rh: 5100
Unit Type and Rh: 5100
Unit Type and Rh: 7300
Unit Type and Rh: 7300
Unit Type and Rh: 9500

## 2019-05-01 LAB — CBC
HCT: 30 % — ABNORMAL LOW (ref 39.0–52.0)
Hemoglobin: 9.5 g/dL — ABNORMAL LOW (ref 13.0–17.0)
MCH: 28.8 pg (ref 26.0–34.0)
MCHC: 31.7 g/dL (ref 30.0–36.0)
MCV: 90.9 fL (ref 80.0–100.0)
Platelets: 327 10*3/uL (ref 150–400)
RBC: 3.3 MIL/uL — ABNORMAL LOW (ref 4.22–5.81)
RDW: 15.1 % (ref 11.5–15.5)
WBC: 12.3 10*3/uL — ABNORMAL HIGH (ref 4.0–10.5)
nRBC: 0 % (ref 0.0–0.2)

## 2019-05-01 LAB — COMPREHENSIVE METABOLIC PANEL
ALT: 48 U/L — ABNORMAL HIGH (ref 0–44)
AST: 42 U/L — ABNORMAL HIGH (ref 15–41)
Albumin: 1.9 g/dL — ABNORMAL LOW (ref 3.5–5.0)
Alkaline Phosphatase: 68 U/L (ref 38–126)
Anion gap: 7 (ref 5–15)
BUN: 17 mg/dL (ref 8–23)
CO2: 24 mmol/L (ref 22–32)
Calcium: 7.4 mg/dL — ABNORMAL LOW (ref 8.9–10.3)
Chloride: 110 mmol/L (ref 98–111)
Creatinine, Ser: 1.32 mg/dL — ABNORMAL HIGH (ref 0.61–1.24)
GFR calc Af Amer: 59 mL/min — ABNORMAL LOW (ref >60)
GFR calc non Af Amer: 51 mL/min — ABNORMAL LOW (ref >60)
Glucose, Bld: 168 mg/dL — ABNORMAL HIGH (ref 70–99)
Potassium: 3.4 mmol/L — ABNORMAL LOW (ref 3.5–5.1)
Sodium: 141 mmol/L (ref 135–145)
Total Bilirubin: 0.3 mg/dL (ref 0.3–1.2)
Total Protein: 5.4 g/dL — ABNORMAL LOW (ref 6.5–8.1)

## 2019-05-01 LAB — GLUCOSE, CAPILLARY
Glucose-Capillary: 141 mg/dL — ABNORMAL HIGH (ref 70–99)
Glucose-Capillary: 177 mg/dL — ABNORMAL HIGH (ref 70–99)
Glucose-Capillary: 144 mg/dL — ABNORMAL HIGH (ref 70–99)
Glucose-Capillary: 141 mg/dL — ABNORMAL HIGH (ref 70–99)

## 2019-05-01 LAB — PROCALCITONIN: Procalcitonin: 0.78 ng/mL

## 2019-05-01 MED ORDER — PANTOPRAZOLE SODIUM 40 MG PO TBEC
40.0000 mg | DELAYED_RELEASE_TABLET | Freq: Every day | ORAL | Status: DC
  Administered 2019-05-01 – 2019-05-03 (×3): 40 mg via ORAL
  Filled 2019-05-01 (×2): qty 1

## 2019-05-01 MED ORDER — POTASSIUM CHLORIDE CRYS ER 20 MEQ PO TBCR
40.0000 meq | EXTENDED_RELEASE_TABLET | Freq: Once | ORAL | Status: AC
  Administered 2019-05-01: 10:00:00 40 meq via ORAL
  Filled 2019-05-01: qty 2

## 2019-05-01 NOTE — Evaluation (Signed)
Physical Therapy Evaluation Patient Details Name: LUIGI STUCKEY Sr. MRN: [id number] DOB: [birth year] Today's Date: 05/01/2019   History of Present Illness  Shonte Soderlund  is a 78 y.o. male s/p Open cholecystectomy, partial colectomy, 04/23/19 with hx of hypertension, hyperlipidemia, Dm2 with CKD stage3, DVT in 2017 after sepsis/cholecystitis w perc cholecystostomy, h/o prior colonoscopy 2011=> diverticulosis, cecal adenoma, apparently c/o painless rectal bleeding x3. Slight abdominal discomfort around umbilicus earlier in the day but none presently.  Pt notes nausea.  Pt notes takes aspirin, but denies NSAIDS or blood thinners.  Pt denies dizziness,  Cp, palp, sob, emesis, diarrhea, black stool, dysuria.      Clinical Impression  Patient requires much time and assistance for sitting up at bedside with fair/good return for using BUE to support self/scoot forward after verbal/tactile cueing.  Patient has difficulty with sit to stands, transfers and taking steps due to BLE weakness and left knee pain, very unsteady on feet and limited to a few steps to transfer to chair.  Patient tolerated sitting up in chair after therapy - RN/NT aware.  Patient will benefit from continued physical therapy in hospital and recommended venue below to increase strength, balance, endurance for safe ADLs and gait.    Follow Up Recommendations SNF    Equipment Recommendations  None recommended by PT    Recommendations for Other Services       Precautions / Restrictions Precautions Precautions: Fall Restrictions Weight Bearing Restrictions: No      Mobility  Bed Mobility Overal bed mobility: Needs Assistance Bed Mobility: Supine to Sit;Rolling Rolling: Min assist;Mod assist   Supine to sit: Mod assist     General bed mobility comments: increased time, labored movement  Transfers Overall transfer level: Needs assistance Equipment used: Rolling walker (2 wheeled) Transfers: Sit to/from [employer] Sit to Stand: Mod assist;Max assist Stand pivot transfers: Mod assist       General transfer comment: difficulty with sit to stands due to BLE weakness  Ambulation/Gait   Gait Distance (Feet): 4 Feet Assistive device: Rolling walker (2 wheeled) Gait Pattern/deviations: Decreased step length - right;Decreased step length - left;Decreased stride length Gait velocity: decreased   General Gait Details: limited to 4-5 slow labored steps due to BLE weaknes and left knee pain, SpO2 desaturates to 85% on room air, required O2 during transfers and taking steps  Stairs            Wheelchair Mobility    Modified Rankin (Stroke Patients Only)       Balance Overall balance assessment: Needs assistance Sitting-balance support: Feet supported Sitting balance-Leahy Scale: Fair Sitting balance - Comments: fair static sitting balance, fair/poor dynamic has to use BUE for support when completing exercises Postural control: Posterior lean Standing balance support: Bilateral upper extremity supported;During functional activity Standing balance-Leahy Scale: Poor Standing balance comment: fair/poor using RW                             Pertinent Vitals/Pain Pain Assessment: Faces Faces Pain Scale: Hurts even more Pain Location: right knee with movement, pressure Pain Descriptors / Indicators: Sore;Discomfort Pain Intervention(s): Limited activity within patient's tolerance;Monitored during session    Home Living Family/patient expects to be discharged to:: Private residence Living Arrangements: Children Available Help at Discharge: Family;Available 24 hours/day Type of Home: House Home Access: Level entry     Home Layout: Two level;Able to live on main level with bedroom/bathroom Home Equipment: Gilford Rile -  2 wheels;Cane - single point;Bedside commode;Shower seat      Prior Function Level of Independence: Needs assistance   Gait / Transfers Assistance  Needed: Household ambulator without AD  ADL's / Homemaking Assistance Needed: assisted by family for community ADL's        Hand Dominance        Extremity/Trunk Assessment   Upper Extremity Assessment Upper Extremity Assessment: Generalized weakness    Lower Extremity Assessment Lower Extremity Assessment: Generalized weakness;LLE deficits/detail LLE Deficits / Details: grossly 3/5 LLE: Unable to fully assess due to pain    Cervical / Trunk Assessment Cervical / Trunk Assessment: Kyphotic  Communication   Communication: No difficulties  Cognition Arousal/Alertness: Awake/alert Behavior During Therapy: WFL for tasks assessed/performed Overall Cognitive Status: Within Functional Limits for tasks assessed                                        General Comments      Exercises General Exercises - Lower Extremity Long Arc Quad: Seated;AAROM;AROM;Strengthening;Both;10 reps Hip Flexion/Marching: Seated;Strengthening;AROM;Both;10 reps Toe Raises: Seated;Strengthening;AROM;Both;10 reps Heel Raises: Seated;AROM;Strengthening;Both;10 reps   Assessment/Plan    PT Assessment Patient needs continued PT services  PT Problem List Decreased strength;Decreased activity tolerance;Decreased balance;Decreased mobility       PT Treatment Interventions Therapeutic exercise;Gait training;Stair training;Functional mobility training;Therapeutic activities;Patient/family education    PT Goals (Current goals can be found in the Care Plan section)  Acute Rehab PT Goals Patient Stated Goal: return home with family to assist PT Goal Formulation: With patient Time For Goal Achievement: 05/15/19 Potential to Achieve Goals: Good    Frequency Min 3X/week   Barriers to discharge        Co-evaluation               AM-PAC PT "6 Clicks" Mobility  Outcome Measure Help needed turning from your back to your side while in a flat bed without using bedrails?: A Lot Help  needed moving from lying on your back to sitting on the side of a flat bed without using bedrails?: A Lot Help needed moving to and from a bed to a chair (including a wheelchair)?: A Lot Help needed standing up from a chair using your arms (e.g., wheelchair or bedside chair)?: A Lot Help needed to walk in hospital room?: A Lot Help needed climbing 3-5 steps with a railing? : Total 6 Click Score: 11    End of Session Equipment Utilized During Treatment: Oxygen Activity Tolerance: Patient tolerated treatment well;Patient limited by fatigue Patient left: in chair;with call bell/phone within reach Nurse Communication: Mobility status PT Visit Diagnosis: Unsteadiness on feet (R26.81);Other abnormalities of gait and mobility (R26.89);Muscle weakness (generalized) (M62.81)    Time: 6270-3500 PT Time Calculation (min) (ACUTE ONLY): 35 min   Charges:   PT Evaluation $PT Eval Moderate Complexity: 1 Mod PT Treatments $Therapeutic Activity: 23-37 mins        11:08 AM, 05/01/19 Lonell Grandchild, MPT Physical Therapist with [facility] [phone] office [phone] mobile phone

## 2019-05-01 NOTE — Progress Notes (Signed)
Pt had a bowel movement, DID NOT CALL. RN went in room to round and assess pt, saw dark bloody BM between pts legs. RN asked pt if he knew he had BM and pt stated "yes, I feel it'. RN asked pt why he didn't call and tell someone.  Pt now has serous blister on R leg- pt educated on skin breakdown that could happen if he sits in stool like that. Pt had no comment and did not respond to nurse.

## 2019-05-01 NOTE — NC FL2 (Deleted)
[place] MEDICAID FL2 LEVEL OF CARE SCREENING TOOL     IDENTIFICATION  Patient Name: Stephen DELATTE Sr. Birthdate: 1941/09/17 Sex: male Admission Date (Current Location): 04/18/2019  [facility] and [place] Number:  [employer] and Address:  [place] [address] [place]      Provider Number: [id number]  Attending Physician Name and Address:  Aviva Signs, MD  Relative Name and Phone Number:       Current Level of Care: Hospital Recommended Level of Care: [place] Prior Approval Number:    Date Approved/Denied:   PASRR Number: [id number]  Discharge Plan: SNF    Current Diagnoses: Patient Active Problem List   Diagnosis Date Noted  . Calculus of gallbladder with cholecystitis without biliary obstruction   . Choloenteric fistula   . History of cholecystitis   . Rectal bleeding 04/18/2019  . Anemia 04/18/2019  . Acute GI bleeding   . Cholecystitis   . Fever   . At high risk for fluid overload 10/13/2016  . Fluid overload 10/13/2016  . Bilateral lower extremity edema 10/13/2016  . Gait disturbance 10/13/2016  . Leukocytosis 10/13/2016  . CKD (chronic kidney disease), stage III ([place]) 10/13/2016  . Left leg DVT ([place]) 10/13/2016  . Acute renal failure (ARF) ([place]) 10/08/2016  . Nausea and vomiting 10/08/2016  . Diabetes mellitus ([place]) 10/08/2016  . Transaminitis 10/08/2016  . Hyperlipemia 10/08/2016  . Renal insufficiency   . Dehydration 10/07/2016    Orientation RESPIRATION BLADDER Height & Weight     Self, Time, Situation, Place  Normal Incontinent Weight: 225 lb 1.4 oz (102.1 kg) Height:  5\' 6"  (167.6 cm)  BEHAVIORAL SYMPTOMS/MOOD NEUROLOGICAL BOWEL NUTRITION STATUS      Continent Diet(see discharge summary )  AMBULATORY STATUS COMMUNICATION OF NEEDS Skin   Limited Assist Verbally Normal                       Personal Care Assistance Level of Assistance  Bathing, Feeding, Dressing Bathing  Assistance: Limited assistance Feeding assistance: Independent Dressing Assistance: Limited assistance     Functional Limitations Info             SPECIAL CARE FACTORS FREQUENCY  PT (By licensed PT)     PT Frequency: 5x/week              Contractures Contractures Info: Not present    Additional Factors Info  Code Status, Allergies Code Status Info: Full Code Allergies Info: NKA            Current Medications (05/01/2019):  This is the current hospital active medication list Current Facility-Administered Medications  Medication Dose Route Frequency Provider Last Rate Last Dose  . acetaminophen (TYLENOL) tablet 650 mg  650 mg Oral Q6H PRN Aviva Signs, MD   650 mg at 04/30/19 2043   Or  . acetaminophen (TYLENOL) suppository 650 mg  650 mg Rectal Q6H PRN Aviva Signs, MD   650 mg at 04/29/19 1419  . Chlorhexidine Gluconate Cloth 2 % PADS 6 each  6 each Topical Q0600 Murlean Iba, MD   6 each at 05/01/19 0518  . diphenhydrAMINE (BENADRYL) 12.5 MG/5ML elixir 12.5 mg  12.5 mg Oral Q6H PRN Virl Cagey, MD   12.5 mg at 04/29/19 1304   Or  . diphenhydrAMINE (BENADRYL) injection 12.5 mg  12.5 mg Intravenous Q6H PRN Virl Cagey, MD      . feeding supplement (BOOST /  RESOURCE BREEZE) liquid 1 Container  1 Container Oral TID BM Aviva Signs, MD   1 Container at 05/01/19 [phone]  . feeding supplement (PRO-STAT SUGAR FREE 64) liquid 30 mL  30 mL Oral TID Aviva Signs, MD   30 mL at 05/01/19 0944  . fentaNYL (SUBLIMAZE) injection 50 mcg  50 mcg Intravenous Q2H PRN Aviva Signs, MD   50 mcg at 04/27/19 1817  . HYDROcodone-acetaminophen (NORCO/VICODIN) 5-325 MG per tablet 1-2 tablet  1-2 tablet Oral Q4H PRN Aviva Signs, MD   2 tablet at 05/01/19 [phone]  . insulin aspart (novoLOG) injection 0-15 Units  0-15 Units Subcutaneous TID WC Aviva Signs, MD   2 Units at 05/01/19 1201  . LORazepam (ATIVAN) injection 0.5 mg  0.5 mg Intravenous Q4H PRN Aviva Signs, MD       . MEDLINE mouth rinse  15 mL Mouth Rinse BID Aviva Signs, MD   15 mL at 05/01/19 0944  . metoprolol tartrate (LOPRESSOR) injection 5 mg  5 mg Intravenous Q6H PRN Manuella Ghazi, Pratik D, DO      . multivitamin with minerals tablet 1 tablet  1 tablet Oral Daily Aviva Signs, MD   1 tablet at 05/01/19 0944  . ondansetron (ZOFRAN-ODT) disintegrating tablet 4 mg  4 mg Oral Q6H PRN Aviva Signs, MD       Or  . ondansetron [facility]) injection 4 mg  4 mg Intravenous Q6H PRN Aviva Signs, MD      . pantoprazole (PROTONIX) EC tablet 40 mg  40 mg Oral Daily Manuella Ghazi, Pratik D, DO   40 mg at 05/01/19 0944  . simethicone (MYLICON) chewable tablet 40 mg  40 mg Oral Q6H PRN Aviva Signs, MD   40 mg at 04/27/19 1813  . verapamil (CALAN-SR) CR tablet 120 mg  120 mg Oral QHS Shah, Pratik D, DO   120 mg at 04/30/19 2218     Discharge Medications: Please see discharge summary for a list of discharge medications.  Relevant Imaging Results:  Relevant Lab Results:   Additional Information SSN [id number] Clydene Pugh, LCSW

## 2019-05-01 NOTE — Progress Notes (Signed)
PROGRESS NOTE    Stephen NOAH Sr.  ZOX:[id number] DOB: 01/14/1941 DOA: 04/18/2019 PCP: [name], MD   Brief Narrative:   78 y.o. male admitted with painless rectal bleeding.Pt was found to have a cholecolo fistula and taken to OR 6/1 for open cholecystectomy and partial colectomy.He is noted to have some continued weakness and barely participates with physical therapy. He is having some bowel movements.He continues to remain fairly sedentary and is feeling weak. He has not had much improvement in hemoglobin levels after PRBC transfusion on 6/6.He was noted to have urine output of 1.2 L overnight on 6/6 and now has noted improving creatinine levels and is prerenal per lab work. He continues to remain on IV fluid. He was noted to have a large, bloody bowel movement on the morning of 6/7 and hemoglobin continues to downtrend. 2 units of PRBCs were ordered and general surgery reevaluation suggesting possibility of anastomotic bleed that should spontaneously resolve.  Will repeat 2 unit PRBC transfusion on 6/8.  Left knee arthrocentesis also performed on 6/7 with findings of inflammatory osteoarthritis, but no significant septic arthritis or gout.  He continues to have rectal bleeding which appears to be slowing down.  Diet will be advanced to full liquid on 6/9 and IV fluid to be discontinued.  Repeat labs pending in a.m.  Assessment & Plan:   Principal Problem:   Rectal bleeding Active Problems:   Diabetes mellitus (HCC)   CKD (chronic kidney disease), stage III (HCC)   Anemia   History of cholecystitis   Calculus of gallbladder with cholecystitis without biliary obstruction   Choloenteric fistula   1. Postop day #8s/p open cholecystectomy and partial colectomy- Pt had colonscopy done 5/30 with findings of abnormal area of colon at hepatic flexure. He had a CT abdomen with findings of a fistula involving the gallbladder and colon. Surgery was consulted. He was  taken to OR on 6/1. He is recoveringfairly slowly and tolerating diet with some bowel movement and flatus noted. He still continues to have some abdominal distention and ileus with leukocytosis noted this morning. Chest x-ray as well as urine analysiswith no signs of infection and this appears to simply be related to some ileus and atelectasis. Urine culturewith no growth. Patient noted to have worsening anemia over the last 2 days with bloody bowel movement this morning. 2 units PRBCs ordered for transfusion. General surgery reevaluation appreciated with possibility of anastomotic bleed that may spontaneously resolve.  Repeat 2 units of PRBCs on 6/8.  H&H currently noted to be mostly stable and will repeat CBC in a.m.  General surgery advance diet to full liquid. 2. Fever-resolving.  Unknown source at this time.    Discontinue IV vancomycin and Zosyn today.  Cultures with no growth. 3. Cholecolo fistula -s/p open chole and partial colectomy6/1/20.  Advance diet to full liquid today.  Change PPI IV twice daily to p.o. Protonix. 4. Hyperkalemia -resolved. Now with some hypokalemia which we will replete orally and recheck in a.m. along with magnesium. 5. Nonoliguric AKI-improving.Continue IV fluid and PRBC transfusions as ordered. Patient appears to have great urine output with 1250 cc noted overnight. Renal ultrasound with no hydronephrosis or mass noted. Fractional excretion of sodium calculated at 0.1% and therefore, patient noted to be prerenal and continues to require volume. This is improving on lab work and will repeat BMP in a.m. Avoid nephrotoxic agents. 6. Mild sinus tachycardia-asymptomaticand improved. Restart home verapamil to start at bedtime. Metoprolol IV to be ordered as  needed in the meantime. Continue SDU monitoring for now and can likely transfer to telemetry by a.m. 7. Acute blood loss anemia -1 unit PRBC transfusiontransfused early on 6/6.Continues to have overt  bleeding and diminished hemoglobin levels for which additional 2 units have been ordered to transfuse on 6/7.  Another 2 units were ordered to transfuse him 6/8 given ongoing bloody bowel movements and low hemoglobin levels.  Hold off on further transfusions currently his H&H remained stable.  He has thus far received 5 units total of PRBC transfusion. 8. Dyslipidemia. Hold statin for now. 9. Left knee osteoarthritis flare-improved.  Gram stain with no organisms noted and cultures with no growth.  No crystals noted in fluid.  Appreciate EDP for arthrocentesis on 6/7.  Continue to monitor for repeat symptoms. 10. Hypertension. Restarted verapamil and will hold spironolactone, lisinopril, and hydralazine for now on account of AKI.  Verapamil at half dose currently from usual home dose. Type 2 DM - continue sliding scale coverage and CBG testing.Improved control noted. Hold metformin and glipizide for now.   DVT prophylaxis: Currently SCDs Code Status:Full Family Communication:Sister updated by general surgery on 6/8 Disposition Plan:PT evaluation reveals need for SNF on discharge, but wants to go home with home health PT services once ready. Discontinue IV antibiotics and IV fluid.  Transfuse again as needed.  Advance to full liquid diet today.  Appreciate further general surgery recommendations.   Consultants:  GI  GS  Procedures: Impression: - One 4 mm polyp at the recto-sigmoid colon,  removed with a cold snare. Resected and retrieved.  Colonic lipoma. Markedly abnormal area of colon at the hepatic  flexure of uncertain etiology. (Query fistula,  atypical presentation for infiltrating tumor or  possibly related to previously placed  cholecystostomy tube). This area is suspicious for   site of bleeding.  6/1-open cholecystectomy and partial colectomy  Antimicrobials:  Anti-infectives (From admission, onward)   Start     Dose/Rate Route Frequency Ordered Stop   04/30/19 1600  vancomycin (VANCOCIN) 1,500 mg in sodium chloride 0.9 % 500 mL IVPB  Status:  Discontinued     1,500 mg 250 mL/hr over 120 Minutes Intravenous Every 24 hours 04/29/19 1558 05/01/19 0900   04/29/19 1600  vancomycin (VANCOCIN) IVPB 1000 mg/200 mL premix     1,000 mg 200 mL/hr over 60 Minutes Intravenous Every 1 hr x 2 04/29/19 1456 04/29/19 1802   04/27/19 1400  piperacillin-tazobactam (ZOSYN) IVPB 3.375 g  Status:  Discontinued     3.375 g 12.5 mL/hr over 240 Minutes Intravenous Every 8 hours 04/27/19 1015 05/01/19 0900   04/24/19 1030  ertapenem (INVANZ) 1,000 mg in sodium chloride 0.9 % 50 mL IVPB  Status:  Discontinued     1 g 100 mL/hr over 30 Minutes Intravenous Every 24 hours 04/23/19 1426 04/27/19 1015   04/23/19 1000  ertapenem (INVANZ) 1,000 mg in sodium chloride 0.9 % 100 mL IVPB     1 g 200 mL/hr over 30 Minutes Intravenous  Once 04/23/19 0930 04/23/19 1345   04/22/19 1030  ertapenem (INVANZ) 1,000 mg in sodium chloride 0.9 % 100 mL IVPB     1 g 200 mL/hr over 30 Minutes Intravenous On call to O.R. 04/22/19 1021 04/23/19 0559       Subjective: Patient seen and evaluated today with no new acute complaints or concerns.  He continues to have some episodes of rectal bleeding with noted large, bloody bowel movement this morning.  H&H has remained stable and  patient has been afebrile.  He does not report any abdominal pain and he has tolerated his clear liquid diet.  Objective: Vitals:   05/01/19 0300 05/01/19 0400 05/01/19 0500 05/01/19 0812  BP: 136/64 130/64 (!) 148/68   Pulse: 78 81 79   Resp: 18 18 18    Temp:  99.6 F (37.6 C)  (!) 97.4 F (36.3 C)  TempSrc:  Oral  Oral  SpO2: 96% (!) 87% 97%   Weight:   102.1 kg   Height:        Intake/Output  Summary (Last 24 hours) at 05/01/2019 1005 Last data filed at 05/01/2019 0500 Gross per 24 hour  Intake 3245.37 ml  Output 950 ml  Net 2295.37 ml   Filed Weights   04/29/19 1518 04/30/19 0500 05/01/19 0500  Weight: 103.1 kg 101.9 kg 102.1 kg    Examination:  General exam: Appears calm and comfortable  Respiratory system: Clear to auscultation. Respiratory effort normal.  Currently on 3 L nasal cannula. Cardiovascular system: S1 & S2 heard, RRR. No JVD, murmurs, rubs, gallops or clicks. No pedal edema. Gastrointestinal system: Abdomen is mildly distended, soft and nontender. No organomegaly or masses felt. Normal bowel sounds heard.  Incision clean, dry, and intact.  Noted to have dark red blood pooling in groin region. Central nervous system: Alert and oriented. No focal neurological deficits. Extremities: Symmetric 5 x 5 power. Skin: No rashes, lesions or ulcers Psychiatry: Judgement and insight appear normal. Mood & affect appropriate.  Foley with continued cloudy urine output    Data Reviewed: I have personally reviewed following labs and imaging studies  CBC: Recent Labs  Lab 04/27/19 0609 04/28/19 0757 04/29/19 0702 04/29/19 2200 04/30/19 0539 04/30/19 1559 05/01/19 0606  WBC 13.9* 12.1* 11.3*  --  12.6*  --  12.3*  NEUTROABS  --   --  8.6*  --   --   --   --   HGB 7.5* 7.8* 7.3* 8.8* 7.4* 10.1* 9.5*  HCT 23.5* 24.3* 23.3* 27.0* 23.1* 30.9* 30.0*  MCV 94.0 90.3 89.6  --  89.9  --  90.9  PLT 245 254 264  --  286  --  989   Basic Metabolic Panel: Recent Labs  Lab 04/25/19 0414 04/26/19 0558 04/27/19 0609 04/28/19 0757 04/29/19 0702 04/30/19 0539 05/01/19 0606  NA 137 137 138 138 139 140 141  K 4.5 3.9 3.9 3.9 3.7 3.5 3.4*  CL 108 107 106 105 107 109 110  CO2 22 23 24 23 23 25 24   GLUCOSE 138* 152* 151* 185* 211* 144* 168*  BUN 17 14 16  27* 26* 23 17  CREATININE 1.30* 1.24 1.33* 2.01* 1.82* 1.53* 1.32*  CALCIUM 7.1* 7.2* 7.4* 7.5* 7.6* 7.4* 7.4*  MG 1.7  1.9 2.0 2.2  --  2.4  --   PHOS 2.5  --   --   --   --   --   --    GFR: Estimated Creatinine Clearance: 50.8 mL/min (A) (by C-G formula based on SCr of 1.32 mg/dL (H)). Liver Function Tests: Recent Labs  Lab 04/25/19 0414 04/26/19 0558 05/01/19 0606  AST 32 25 42*  ALT 36 26 48*  ALKPHOS 73 83 68  BILITOT 0.6 0.6 0.3  PROT 5.2* 5.4* 5.4*  ALBUMIN 2.5* 2.4* 1.9*   No results for input(s): LIPASE, AMYLASE in the last 168 hours. No results for input(s): AMMONIA in the last 168 hours. Coagulation Profile: Recent Labs  Lab 04/29/19 1642  INR  1.2   Cardiac Enzymes: No results for input(s): CKTOTAL, CKMB, CKMBINDEX, TROPONINI in the last 168 hours. BNP (last 3 results) No results for input(s): PROBNP in the last 8760 hours. HbA1C: No results for input(s): HGBA1C in the last 72 hours. CBG: Recent Labs  Lab 04/30/19 0804 04/30/19 1120 04/30/19 1618 04/30/19 2111 05/01/19 0813  GLUCAP 137* 137* 300* 177* 141*   Lipid Profile: No results for input(s): CHOL, HDL, LDLCALC, TRIG, CHOLHDL, LDLDIRECT in the last 72 hours. Thyroid Function Tests: No results for input(s): TSH, T4TOTAL, FREET4, T3FREE, THYROIDAB in the last 72 hours. Anemia Panel: No results for input(s): VITAMINB12, FOLATE, FERRITIN, TIBC, IRON, RETICCTPCT in the last 72 hours. Sepsis Labs: Recent Labs  Lab 04/29/19 1642 04/29/19 2200 04/30/19 0539 05/01/19 0606  PROCALCITON 1.71  --  1.34 0.78  LATICACIDVEN 1.3 1.5  --   --     Recent Results (from the past 240 hour(s))  Surgical pcr screen     Status: None   Collection Time: 04/22/19  7:33 AM  Result Value Ref Range Status   MRSA, PCR NEGATIVE NEGATIVE Final   Staphylococcus aureus NEGATIVE NEGATIVE Final    Comment: (NOTE) The Xpert SA Assay (FDA approved for NASAL specimens in patients 1 years of age and older), is one component of a comprehensive surveillance program. It is not intended to diagnose infection nor to guide or monitor treatment.  Performed at [facility], [address], [place], [place] [address]   Culture, Urine     Status: None   Collection Time: 04/27/19 11:13 AM  Result Value Ref Range Status   Specimen Description   Final    URINE, CLEAN CATCH Performed at [facility], [address], [place], [place] [address]    Special Requests   Final    NONE Performed at [facility], [address], [place], [place] [address]    Culture   Final    NO GROWTH Performed at [facility], [place] [address], [place], [place] [address]    Report Status 04/28/2019 FINAL  Final  Culture, blood (routine x 2)     Status: None (Preliminary result)   Collection Time: 04/29/19  2:53 PM  Result Value Ref Range Status   Specimen Description   Final    BLOOD RIGHT ARM BOTTLES DRAWN AEROBIC AND ANAEROBIC   Special Requests Blood Culture adequate volume  Final   Culture   Final    NO GROWTH 2 DAYS Performed at [facility], [address], [place], [place] [address]    Report Status PENDING  Incomplete  Culture, blood (routine x 2)     Status: None (Preliminary result)   Collection Time: 04/29/19  2:54 PM  Result Value Ref Range Status   Specimen Description BLOOD  Final   Special Requests NONE  Final   Culture   Final    NO GROWTH 2 DAYS Performed at [facility], [address], [place], [place] [address]    Report Status PENDING  Incomplete  Stat Gram stain     Status: None   Collection Time: 04/29/19  4:27 PM  Result Value Ref Range Status   Specimen Description KNEE  Final   Special Requests Normal  Final   Gram Stain   Final    CYTOSPIN SMEAR NO ORGANISMS SEEN WBC PRESENT, PREDOMINANTLY PMN Performed at [facility], [address], [place], [place] [address]    Report Status 04/29/2019 FINAL  Final  Culture, body fluid-bottle  Status: None (Preliminary result)   Collection Time: 04/29/19  4:27 PM  Result Value Ref Range Status   Specimen Description SYNOVIAL BOTTLES DRAWN AEROBIC AND ANAEROBIC  Final    Special Requests 10CC  Final   Culture   Final    NO GROWTH 2 DAYS Performed at [facility], [address], [place], [place] [address]    Report Status PENDING  Incomplete         Radiology Studies: Dg Knee 1-2 Views Left  Result Date: 04/29/2019 CLINICAL DATA:  Left knee pain and swelling. Fluid aspirated from knee today. Daughter. EXAM: LEFT KNEE - 1-2 VIEW COMPARISON:  None. FINDINGS: Mild to moderate tricompartmental osteoarthritic changes present. There is evidence of a small joint effusion. No evidence of fracture or dislocation. IMPRESSION: No acute findings. Small joint effusion.  Mild to moderate osteoarthritis. Electronically Signed   By: Marin Olp M.D.   On: 04/29/2019 18:17   Dg Chest Port 1 View  Result Date: 04/30/2019 CLINICAL DATA:  Rectal bleeding.  Open cholecystectomy April 23, 2019. EXAM: PORTABLE CHEST 1 VIEW COMPARISON:  April 27, 2019 FINDINGS: Low lung volumes limit evaluation. Opacity remains in the right base, likely a small effusion with atelectasis. Infiltrate not excluded. No change in the cardiomediastinal silhouette. IMPRESSION: Mild opacity in the right base may represent a small effusion with atelectasis versus infiltrate. Recommend follow-up to resolution. Electronically Signed   By: Dorise Bullion III M.D   On: 04/30/2019 14:22        Scheduled Meds: . Chlorhexidine Gluconate Cloth  6 each Topical Q0600  . feeding supplement  1 Container Oral TID BM  . feeding supplement (PRO-STAT SUGAR FREE 64)  30 mL Oral TID  . insulin aspart  0-15 Units Subcutaneous TID WC  . mouth rinse  15 mL Mouth Rinse BID  . multivitamin with minerals  1 tablet Oral Daily  . pantoprazole  40 mg Oral Daily  . potassium chloride  40 mEq Oral Once  . verapamil  120 mg Oral QHS   Continuous Infusions:   LOS: 11 days    Time spent: 30 minutes    Pratik Darleen Crocker, DO [facility] Pager [phone]  If 7PM-7AM, please contact night-coverage [email or web address]  Password [email or web address] 05/01/2019, 10:05 AM

## 2019-05-01 NOTE — TOC Progression Note (Signed)
Transition of Care (TOC) - Progression Note    Patient Details  Name: Stephen GILDERSLEEVE Sr. MRN: [id number] Date of Birth: [birth year]  Transition of Care [facility]) CM/SW Contact  Keegen, [place], LCSW Phone Number: 05/01/2019, 5:40 PM  Clinical Narrative:    Patient is now agreeable to SNF for short term rehab.  Referrals sent out to selected facilities.    Expected Discharge Plan: [place] Barriers to Discharge: Continued Medical Work up  Expected Discharge Plan and Services Expected Discharge Plan: [place] In-house Referral: Clinical Social Work Discharge Planning Services: CM Consult Post Acute Care Choice: [place] arrangements for the past 2 months: [place]: RN, PT [place] Agency: [facility] [employer]) Date [place]: 04/25/19 Time [place]: [place] Representative spoke with at [place]: [place] ([place]) Interventions    Readmission Risk Interventions Readmission Risk Prevention Plan 04/25/2019  Transportation Screening Complete  Home Care Screening Complete  Some recent data might be hidden

## 2019-05-01 NOTE — NC FL2 (Signed)
[place] MEDICAID FL2 LEVEL OF CARE SCREENING TOOL     IDENTIFICATION  Patient Name: Stephen Rogers. Birthdate: 06-30-1941 Sex: male Admission Date (Current Location): 04/18/2019  [facility] and [place] Number:  [employer] and Address:  [facility] [address], [place]      Provider Number: [id number]  Attending Physician Name and Address:  Aviva Signs, MD  Relative Name and Phone Number:       Current Level of Care: Hospital Recommended Level of Care: [place] Prior Approval Number:    Date Approved/Denied:   PASRR Number: [id number]  Discharge Plan: SNF    Current Diagnoses: Patient Active Problem List   Diagnosis Date Noted  . Calculus of gallbladder with cholecystitis without biliary obstruction   . Choloenteric fistula   . History of cholecystitis   . Rectal bleeding 04/18/2019  . Anemia 04/18/2019  . Acute GI bleeding   . Cholecystitis   . Fever   . At high risk for fluid overload 10/13/2016  . Fluid overload 10/13/2016  . Bilateral lower extremity edema 10/13/2016  . Gait disturbance 10/13/2016  . Leukocytosis 10/13/2016  . CKD (chronic kidney disease), stage III ([place]) 10/13/2016  . Left leg DVT ([place]) 10/13/2016  . Acute renal failure (ARF) ([place]) 10/08/2016  . Nausea and vomiting 10/08/2016  . Diabetes mellitus ([place]) 10/08/2016  . Transaminitis 10/08/2016  . Hyperlipemia 10/08/2016  . Renal insufficiency   . Dehydration 10/07/2016    Orientation RESPIRATION BLADDER Height & Weight     Self, Time, Situation, Place  Normal Incontinent Weight: 225 lb 1.4 oz (102.1 kg) Height:  5\' 6"  (167.6 cm)  BEHAVIORAL SYMPTOMS/MOOD NEUROLOGICAL BOWEL NUTRITION STATUS      Continent Diet(see discharge summary )  AMBULATORY STATUS COMMUNICATION OF NEEDS Skin   Limited Assist Verbally Surgical wounds(abdomen)                       Personal Care Assistance Level of Assistance  Bathing,  Feeding, Dressing Bathing Assistance: Limited assistance Feeding assistance: Independent Dressing Assistance: Limited assistance     Functional Limitations Info             SPECIAL CARE FACTORS FREQUENCY  PT (By licensed PT)     PT Frequency: 5x/week              Contractures Contractures Info: Not present    Additional Factors Info  Code Status, Allergies Code Status Info: Full Code Allergies Info: NKA            Current Medications (05/01/2019):  This is the current hospital active medication list Current Facility-Administered Medications  Medication Dose Route Frequency Provider Last Rate Last Dose  . acetaminophen (TYLENOL) tablet 650 mg  650 mg Oral Q6H PRN Aviva Signs, MD   650 mg at 04/30/19 2043   Or  . acetaminophen (TYLENOL) suppository 650 mg  650 mg Rectal Q6H PRN Aviva Signs, MD   650 mg at 04/29/19 1419  . Chlorhexidine Gluconate Cloth 2 % PADS 6 each  6 each Topical Q0600 Murlean Iba, MD   6 each at 05/01/19 0518  . diphenhydrAMINE (BENADRYL) 12.5 MG/5ML elixir 12.5 mg  12.5 mg Oral Q6H PRN Virl Cagey, MD   12.5 mg at 04/29/19 1304   Or  . diphenhydrAMINE (BENADRYL) injection 12.5 mg  12.5 mg Intravenous Q6H PRN Virl Cagey, MD      . feeding supplement (BOOST /  RESOURCE BREEZE) liquid 1 Container  1 Container Oral TID BM Aviva Signs, MD   1 Container at 05/01/19 [phone]  . feeding supplement (PRO-STAT SUGAR FREE 64) liquid 30 mL  30 mL Oral TID Aviva Signs, MD   30 mL at 05/01/19 1651  . fentaNYL (SUBLIMAZE) injection 50 mcg  50 mcg Intravenous Q2H PRN Aviva Signs, MD   50 mcg at 04/27/19 1817  . HYDROcodone-acetaminophen (NORCO/VICODIN) 5-325 MG per tablet 1-2 tablet  1-2 tablet Oral Q4H PRN Aviva Signs, MD   2 tablet at 05/01/19 [phone]  . insulin aspart (novoLOG) injection 0-15 Units  0-15 Units Subcutaneous TID WC Aviva Signs, MD   2 Units at 05/01/19 1651  . LORazepam (ATIVAN) injection 0.5 mg  0.5 mg Intravenous Q4H  PRN Aviva Signs, MD      . MEDLINE mouth rinse  15 mL Mouth Rinse BID Aviva Signs, MD   15 mL at 05/01/19 0944  . metoprolol tartrate (LOPRESSOR) injection 5 mg  5 mg Intravenous Q6H PRN Manuella Ghazi, Pratik D, DO      . multivitamin with minerals tablet 1 tablet  1 tablet Oral Daily Aviva Signs, MD   1 tablet at 05/01/19 0944  . ondansetron (ZOFRAN-ODT) disintegrating tablet 4 mg  4 mg Oral Q6H PRN Aviva Signs, MD       Or  . ondansetron [facility]) injection 4 mg  4 mg Intravenous Q6H PRN Aviva Signs, MD      . pantoprazole (PROTONIX) EC tablet 40 mg  40 mg Oral Daily Manuella Ghazi, Pratik D, DO   40 mg at 05/01/19 0944  . simethicone (MYLICON) chewable tablet 40 mg  40 mg Oral Q6H PRN Aviva Signs, MD   40 mg at 04/27/19 1813  . verapamil (CALAN-Rogers) CR tablet 120 mg  120 mg Oral QHS Shah, Pratik D, DO   120 mg at 04/30/19 2218     Discharge Medications: Please see discharge summary for a list of discharge medications.  Relevant Imaging Results:  Relevant Lab Results:   Additional Information SSN [id number] Clydene Pugh, LCSW

## 2019-05-01 NOTE — Progress Notes (Signed)
8 Days Post-Op  Subjective: Patient has no complaints.  Seems more alert and animated today.  Denies shortness of breath while lying in bed.  Objective: Vital signs in last 24 hours: Temp:  [97.4 F (36.3 C)-100 F (37.8 C)] 97.4 F (36.3 C) (06/09 0812) Pulse Rate:  [66-93] 79 (06/09 0500) Resp:  [12-20] 18 (06/09 0500) BP: (100-148)/(58-103) 148/68 (06/09 0500) SpO2:  [87 %-99 %] 97 % (06/09 0500) Weight:  [102.1 kg] 102.1 kg (06/09 0500) Last BM Date: 05/01/19  Intake/Output from previous day: 06/08 0701 - 06/09 0700 In: 3500.8 [P.O.:220; I.V.:2040.6; Blood:630; IV Piggyback:610.2] Out: 950 [Urine:950] Intake/Output this shift: No intake/output data recorded.  General appearance: alert, cooperative and no distress GI: Soft, nontender, nondistended.  Incision healing well.  Lab Results:  Recent Labs    04/30/19 0539 04/30/19 1559 05/01/19 0606  WBC 12.6*  --  12.3*  HGB 7.4* 10.1* 9.5*  HCT 23.1* 30.9* 30.0*  PLT 286  --  327   BMET Recent Labs    04/30/19 0539 05/01/19 0606  NA 140 141  K 3.5 3.4*  CL 109 110  CO2 25 24  GLUCOSE 144* 168*  BUN 23 17  CREATININE 1.53* 1.32*  CALCIUM 7.4* 7.4*   PT/INR Recent Labs    04/29/19 1642  LABPROT 14.9  INR 1.2    Studies/Results: Dg Knee 1-2 Views Left  Result Date: 04/29/2019 CLINICAL DATA:  Left knee pain and swelling. Fluid aspirated from knee today. Daughter. EXAM: LEFT KNEE - 1-2 VIEW COMPARISON:  None. FINDINGS: Mild to moderate tricompartmental osteoarthritic changes present. There is evidence of a small joint effusion. No evidence of fracture or dislocation. IMPRESSION: No acute findings. Small joint effusion.  Mild to moderate osteoarthritis. Electronically Signed   By: Marin Olp M.D.   On: 04/29/2019 18:17   Dg Chest Port 1 View  Result Date: 04/30/2019 CLINICAL DATA:  Rectal bleeding.  Open cholecystectomy April 23, 2019. EXAM: PORTABLE CHEST 1 VIEW COMPARISON:  April 27, 2019 FINDINGS: Low lung  volumes limit evaluation. Opacity remains in the right base, likely a small effusion with atelectasis. Infiltrate not excluded. No change in the cardiomediastinal silhouette. IMPRESSION: Mild opacity in the right base may represent a small effusion with atelectasis versus infiltrate. Recommend follow-up to resolution. Electronically Signed   By: Dorise Bullion III M.D   On: 04/30/2019 14:22    Anti-infectives: Anti-infectives (From admission, onward)   Start     Dose/Rate Route Frequency Ordered Stop   04/30/19 1600  vancomycin (VANCOCIN) 1,500 mg in sodium chloride 0.9 % 500 mL IVPB  Status:  Discontinued     1,500 mg 250 mL/hr over 120 Minutes Intravenous Every 24 hours 04/29/19 1558 05/01/19 0900   04/29/19 1600  vancomycin (VANCOCIN) IVPB 1000 mg/200 mL premix     1,000 mg 200 mL/hr over 60 Minutes Intravenous Every 1 hr x 2 04/29/19 1456 04/29/19 1802   04/27/19 1400  piperacillin-tazobactam (ZOSYN) IVPB 3.375 g  Status:  Discontinued     3.375 g 12.5 mL/hr over 240 Minutes Intravenous Every 8 hours 04/27/19 1015 05/01/19 0900   04/24/19 1030  ertapenem (INVANZ) 1,000 mg in sodium chloride 0.9 % 50 mL IVPB  Status:  Discontinued     1 g 100 mL/hr over 30 Minutes Intravenous Every 24 hours 04/23/19 1426 04/27/19 1015   04/23/19 1000  ertapenem (INVANZ) 1,000 mg in sodium chloride 0.9 % 100 mL IVPB     1 g 200 mL/hr over  30 Minutes Intravenous  Once 04/23/19 0930 04/23/19 1345   04/22/19 1030  ertapenem (INVANZ) 1,000 mg in sodium chloride 0.9 % 100 mL IVPB     1 g 200 mL/hr over 30 Minutes Intravenous On call to O.R. 04/22/19 1021 04/23/19 0559      Assessment/Plan: s/p Procedure(s): CHOLECYSTECTOMY PARTIAL COLECTOMY Impression: Patient's hemoglobin has remained stable after multiple blood transfusions.  Patient intermittently having dark blood in his stools, though it is difficult to fully assess as he does not tell nursing staff when he is having a bowel movement.  I suspect the  GI bleeding is slowing down.  Discussed with Dr. Manuella Ghazi.  Will advance to full liquid diet.  Strongly encouraged patient to get out of bed.  Would Hep-Lock IV.  Will DC Zosyn.  LOS: 11 days    Aviva Signs 05/01/2019

## 2019-05-02 DIAGNOSIS — K625 Hemorrhage of anus and rectum: Secondary | ICD-10-CM

## 2019-05-02 LAB — CBC
HCT: 28.4 % — ABNORMAL LOW (ref 39.0–52.0)
HCT: 26.4 % — ABNORMAL LOW (ref 39.0–52.0)
Hemoglobin: 9 g/dL — ABNORMAL LOW (ref 13.0–17.0)
Hemoglobin: 8.5 g/dL — ABNORMAL LOW (ref 13.0–17.0)
MCH: 29.1 pg (ref 26.0–34.0)
MCH: 29.6 pg (ref 26.0–34.0)
MCHC: 31.7 g/dL (ref 30.0–36.0)
MCHC: 32.2 g/dL (ref 30.0–36.0)
MCV: 91.9 fL (ref 80.0–100.0)
MCV: 92 fL (ref 80.0–100.0)
Platelets: 354 10*3/uL (ref 150–400)
Platelets: 354 10*3/uL (ref 150–400)
RBC: 3.09 MIL/uL — ABNORMAL LOW (ref 4.22–5.81)
RBC: 2.87 MIL/uL — ABNORMAL LOW (ref 4.22–5.81)
RDW: 15 % (ref 11.5–15.5)
RDW: 15 % (ref 11.5–15.5)
WBC: 13.9 10*3/uL — ABNORMAL HIGH (ref 4.0–10.5)
WBC: 13.6 10*3/uL — ABNORMAL HIGH (ref 4.0–10.5)
nRBC: 0 % (ref 0.0–0.2)
nRBC: 0 % (ref 0.0–0.2)

## 2019-05-02 LAB — GLUCOSE, CAPILLARY
Glucose-Capillary: 142 mg/dL — ABNORMAL HIGH (ref 70–99)
Glucose-Capillary: 181 mg/dL — ABNORMAL HIGH (ref 70–99)
Glucose-Capillary: 162 mg/dL — ABNORMAL HIGH (ref 70–99)
Glucose-Capillary: 187 mg/dL — ABNORMAL HIGH (ref 70–99)

## 2019-05-02 LAB — BASIC METABOLIC PANEL
Anion gap: 9 (ref 5–15)
BUN: 14 mg/dL (ref 8–23)
CO2: 24 mmol/L (ref 22–32)
Calcium: 7.6 mg/dL — ABNORMAL LOW (ref 8.9–10.3)
Chloride: 110 mmol/L (ref 98–111)
Creatinine, Ser: 1.22 mg/dL (ref 0.61–1.24)
GFR calc Af Amer: >60 mL/min (ref >60)
GFR calc non Af Amer: 56 mL/min — ABNORMAL LOW (ref >60)
Glucose, Bld: 142 mg/dL — ABNORMAL HIGH (ref 70–99)
Potassium: 3.7 mmol/L (ref 3.5–5.1)
Sodium: 143 mmol/L (ref 135–145)

## 2019-05-02 LAB — MAGNESIUM: Magnesium: 2 mg/dL (ref 1.7–2.4)

## 2019-05-02 NOTE — Progress Notes (Signed)
Late entry  Patient refused to use incentive spirometer last night when asked by this RN, patient says he has been using it and reached "at least 1,000."

## 2019-05-02 NOTE — Progress Notes (Signed)
Patient Demographics:    Stephen Rogers, is a 78 y.o. male, DOB - [birth year], SEG:[id number]  Admit date - 04/18/2019   Admitting Physician Jani Gravel, MD  Outpatient Primary MD for the patient is Stephen Fire, MD  LOS - 12   Chief Complaint  Patient presents with   Rectal Bleeding        Subjective:    [name] today has no fevers, no emesis,  No chest pain, one dark, somewhat bloody stool early this morning... Tolerating diet well, no nausea or vomiting, no fevers or chills, no dizziness or shortness of breath  Assessment  & Plan :    Principal Problem:   Rectal bleeding Active Problems:   Diabetes mellitus (HCC)   CKD (chronic kidney disease), stage III (HCC)   Anemia   History of cholecystitis   Calculus of gallbladder with cholecystitis without biliary obstruction   Choloenteric fistula   1) painless rectal bleeding--- General surgery input appreciated, follow H&H and transfuse as clinically indicated, symptoms received 5 units of PRBCs to date  2) status post open cholecystectomy and partial colectomy for colo-colo-fistula---  S/p surgery on 04/23/2019, general surgeon is following and managing  3) left knee pain--status post left knee arthrocentesis on 04/29/2019 with findings of inflammatory osteoarthritis without septic arthritis or gout  4)HTN--stable continue verapamil 120 mg  5) acute blood loss anemia secondary to #1 above--- management as above #1  6)DM2--no recent A1c available, Use Novolog/Humalog Sliding scale insulin with Accu-Cheks/Fingersticks as ordered  7) generalized weakness/debility--- physical therapy evaluation appreciated and recommend SNF rehab   Disposition/Need for in-Hospital Stay- patient unable to be discharged at this time due to waiting transfer to SNF rehab if H&H remained stable  Code Status : Full  Family Communication:   Na (patient is  coherent)   Disposition Plan  : SNF  Consults  :  Gen surgery/Gi  DVT Prophylaxis  :  - SCDs (Rectal bleeding)  Lab Results  Component Value Date   PLT 354 05/02/2019    Inpatient Medications  Scheduled Meds:  Chlorhexidine Gluconate Cloth  6 each Topical Q0600   feeding supplement  1 Container Oral TID BM   feeding supplement (PRO-STAT SUGAR FREE 64)  30 mL Oral TID   insulin aspart  0-15 Units Subcutaneous TID WC   mouth rinse  15 mL Mouth Rinse BID   multivitamin with minerals  1 tablet Oral Daily   pantoprazole  40 mg Oral Daily   verapamil  120 mg Oral QHS   Continuous Infusions: PRN Meds:.acetaminophen **OR** acetaminophen, diphenhydrAMINE **OR** diphenhydrAMINE, fentaNYL (SUBLIMAZE) injection, HYDROcodone-acetaminophen, LORazepam, metoprolol tartrate, ondansetron **OR** ondansetron (ZOFRAN) IV, simethicone    Anti-infectives (From admission, onward)   Start     Dose/Rate Route Frequency Ordered Stop   04/30/19 1600  vancomycin (VANCOCIN) 1,500 mg in sodium chloride 0.9 % 500 mL IVPB  Status:  Discontinued     1,500 mg 250 mL/hr over 120 Minutes Intravenous Every 24 hours 04/29/19 1558 05/01/19 0900   04/29/19 1600  vancomycin (VANCOCIN) IVPB 1000 mg/200 mL premix     1,000 mg 200 mL/hr over 60 Minutes Intravenous Every 1 hr x 2 04/29/19 1456 04/29/19 1802   04/27/19 1400  piperacillin-tazobactam (ZOSYN) IVPB 3.375  g  Status:  Discontinued     3.375 g 12.5 mL/hr over 240 Minutes Intravenous Every 8 hours 04/27/19 1015 05/01/19 0900   04/24/19 1030  ertapenem (INVANZ) 1,000 mg in sodium chloride 0.9 % 50 mL IVPB  Status:  Discontinued     1 g 100 mL/hr over 30 Minutes Intravenous Every 24 hours 04/23/19 1426 04/27/19 1015   04/23/19 1000  ertapenem (INVANZ) 1,000 mg in sodium chloride 0.9 % 100 mL IVPB     1 g 200 mL/hr over 30 Minutes Intravenous  Once 04/23/19 0930 04/23/19 1345   04/22/19 1030  ertapenem (INVANZ) 1,000 mg in sodium chloride 0.9 % 100 mL  IVPB     1 g 200 mL/hr over 30 Minutes Intravenous On call to O.R. 04/22/19 1021 04/23/19 0559        Objective:   Vitals:   05/02/19 0736 05/02/19 1000 05/02/19 1100 05/02/19 1133  BP:  136/68    Pulse: 81 90 85 95  Resp: 18 16 16    Temp: 99.9 F (37.7 C)   98.7 F (37.1 C)  TempSrc: Oral   Oral  SpO2: 100% 99% 95% 97%  Weight:      Height:        Wt Readings from Last 3 Encounters:  05/02/19 103.5 kg  09/13/18 91.6 kg  08/31/18 90.3 kg     Intake/Output Summary (Last 24 hours) at 05/02/2019 1432 Last data filed at 05/01/2019 1500 Gross per 24 hour  Intake 0 ml  Output --  Net 0 ml     Physical Exam Patient is examined daily including today on 05/02/19 , exams remain the same as of yesterday except that has changed   Gen:- Awake Alert,  In no apparent distress  HEENT:- [place].AT, No sclera icterus Neck-Supple Neck,No JVD,.  Lungs-  CTAB , fair symmetrical air movement CV- S1, S2 normal, regular  Abd-  +ve B.Sounds, Abd Soft, No significant tenderness, healing abdominal wound, Extremity/Skin:- No  edema, pedal pulses present  Psych-affect is appropriate, oriented x3 Neuro-generalized weakness without new focal deficits, no tremors   Data Review:   Micro Results Recent Results (from the past 240 hour(s))  Culture, Urine     Status: None   Collection Time: 04/27/19 11:13 AM  Result Value Ref Range Status   Specimen Description   Final    URINE, CLEAN CATCH Performed at [facility], [address], [place], [place] [address]    Special Requests   Final    NONE Performed at [facility], [address], [place], [place] [address]    Culture   Final    NO GROWTH Performed at [facility], [place] [address], [place], [place] [address]    Report Status 04/28/2019 FINAL  Final  Culture, blood (routine x 2)     Status: None (Preliminary result)   Collection Time: 04/29/19  2:53 PM  Result Value Ref Range Status   Specimen Description   Final    BLOOD RIGHT  ARM BOTTLES DRAWN AEROBIC AND ANAEROBIC   Special Requests Blood Culture adequate volume  Final   Culture   Final    NO GROWTH 3 DAYS Performed at [facility], [address] [place],  [address]    Report Status PENDING  Incomplete  Culture, blood (routine x 2)     Status: None (Preliminary result)   Collection Time: 04/29/19  2:54 PM  Result Value Ref Range Status   Specimen Description BLOOD  Final   Special Requests NONE  Final   Culture   Final    NO GROWTH 3 DAYS Performed at [facility], [address] [place], [place] [address]    Report Status PENDING  Incomplete  Stat Gram stain     Status: None   Collection Time: 04/29/19  4:27 PM  Result Value Ref Range Status   Specimen Description KNEE  Final   Special Requests Normal  Final   Gram Stain   Final    CYTOSPIN SMEAR NO ORGANISMS SEEN WBC PRESENT, PREDOMINANTLY PMN Performed at [facility], [address] [place], [place] [address]    Report Status 04/29/2019 FINAL  Final  Culture, body fluid-bottle     Status: None (Preliminary result)   Collection Time: 04/29/19  4:27 PM  Result Value Ref Range Status   Specimen Description SYNOVIAL BOTTLES DRAWN AEROBIC AND ANAEROBIC  Final   Special Requests 10CC  Final   Culture   Final    NO GROWTH 3 DAYS Performed at [facility], [address], [place], [place] [address]    Report Status PENDING  Incomplete    Radiology Reports Dg Knee 1-2 Views Left  Result Date: 04/29/2019 CLINICAL DATA:  Left knee pain and swelling. Fluid aspirated from knee today. Daughter. EXAM: LEFT KNEE - 1-2 VIEW COMPARISON:  None. FINDINGS: Mild to moderate tricompartmental osteoarthritic changes present. There is evidence of a small joint effusion. No evidence of fracture or dislocation. IMPRESSION: No acute findings. Small joint effusion.  Mild to moderate osteoarthritis. Electronically Signed   By: Marin Olp M.D.   On: 04/29/2019 18:17   Ct Abdomen Pelvis W Contrast  Result Date:  04/19/2019 CLINICAL DATA:  Rectal bleeding and anemia. EXAM: CT ABDOMEN AND PELVIS WITH CONTRAST TECHNIQUE: Multidetector CT imaging of the abdomen and pelvis was performed using the standard protocol following bolus administration of intravenous contrast. CONTRAST:  151mL OMNIPAQUE IOHEXOL 300 MG/ML  SOLN COMPARISON:  CT dated 10/22/2016. FINDINGS: Lower chest: There is bilateral gynecomastia. The heart is enlarged. Coronary artery calcifications are noted. Hepatobiliary: The liver is unremarkable. The gallbladder is diffusely abnormal with multiple pockets of gas and hyperdense material. The gallbladder wall does not appear significantly thickened. The common bile duct is mildly dilated. There appears to be a fistula tract from the gallbladder to the hepatic flexure of the colon. This can be best visualized on the coronal and sagittal series. An old cholecystostomy tube drain track is noted coursing towards the anterior abdominal wall. Pancreas: Unremarkable. No pancreatic ductal dilatation or surrounding inflammatory changes. Spleen: Normal in size without focal abnormality. Adrenals/Urinary Tract: The left kidney is unremarkable aside from a small cyst arising from the lower pole. There is a slightly complex 2.3 cm cystic structure in the upper pole the right kidney. Both adrenal glands are unremarkable. There is no hydronephrosis. The urinary bladder is significantly distended with layering hyperdense material within the dependent portion, likely related to prior contrast injection. Stomach/Bowel: There are scattered colonic diverticula without CT evidence of diverticulitis. There are air-fluid levels in the colon which are likely related to prior colonoscopy prep. The appendix is unremarkable. The stomach is grossly unremarkable. There is no evidence of a small-bowel obstruction. Vascular/Lymphatic: Aortic atherosclerosis. No enlarged abdominal or pelvic lymph nodes. Reproductive: The prostate gland is  significantly enlarged. Other: No abdominal wall hernia or abnormality. No abdominopelvic ascites. Musculoskeletal: Multilevel degenerative changes are noted of the thoracolumbar spine. IMPRESSION: 1. Findings highly suspicious for a fistula track  between the gallbladder lumen and hepatic flexure of the colon. There are multiple pockets of gas and debris within the gallbladder lumen. There is no definite CT evidence of acute cholecystitis. 2. Moderate dilatation of the common bile duct. Correlation with laboratory studies is recommended to help exclude an obstructing process. 3. Significantly distended urinary bladder. 4. No specific abnormality identified to explain the patient's painless hematochezia. 5. Cardiomegaly.  Bilateral gynecomastia is noted. Electronically Signed   By: Constance Holster M.D.   On: 04/19/2019 21:41   US Renal  Result Date: 04/28/2019 CLINICAL DATA:  Acute renal insufficiency. Patient currently admitted. Diabetes. EXAM: RENAL / URINARY TRACT ULTRASOUND COMPLETE COMPARISON:  CT 04/19/2019 FINDINGS: Right Kidney: Renal measurements: 10.8 x 5.5 x 6.8 cm = volume: 210 mL . Echogenicity within normal limits. No mass or hydronephrosis visualized. 2.3 cm upper pole cyst. Left Kidney: Renal measurements: 9.0 x 6.0 x 5.1 cm = volume: 144 mL. Echogenicity within normal limits. No mass or hydronephrosis visualized. 2.6 cm midpole cyst and 2.5 cm upper pole cyst. Bladder: Appears normal for degree of bladder distention. IMPRESSION: Normal size kidneys without hydronephrosis.  Bilateral renal cysts. Electronically Signed   By: Marin Olp M.D.   On: 04/28/2019 16:26   Dg Chest Port 1 View  Result Date: 04/30/2019 CLINICAL DATA:  Rectal bleeding.  Open cholecystectomy April 23, 2019. EXAM: PORTABLE CHEST 1 VIEW COMPARISON:  April 27, 2019 FINDINGS: Low lung volumes limit evaluation. Opacity remains in the right base, likely a small effusion with atelectasis. Infiltrate not excluded. No change in  the cardiomediastinal silhouette. IMPRESSION: Mild opacity in the right base may represent a small effusion with atelectasis versus infiltrate. Recommend follow-up to resolution. Electronically Signed   By: Dorise Bullion III M.D   On: 04/30/2019 14:22   Dg Chest Port 1 View  Result Date: 04/27/2019 CLINICAL DATA:  Fever. EXAM: PORTABLE CHEST 1 VIEW COMPARISON:  10/21/2016. FINDINGS: Again demonstrated is a very poor inspiration. Mild-to-moderate bibasilar atelectasis. Mild peribronchial thickening. Stable borderline enlarged cardiac silhouette. Unremarkable bones. IMPRESSION: 1. Poor inspiration with mild to moderate bibasilar atelectasis. 2. Mild bronchitic changes. Electronically Signed   By: Claudie Revering M.D.   On: 04/27/2019 13:28     CBC Recent Labs  Lab 04/28/19 0757 04/29/19 0702 04/29/19 2200 04/30/19 0539 04/30/19 1559 05/01/19 0606 05/02/19 0450  WBC 12.1* 11.3*  --  12.6*  --  12.3* 13.9*  HGB 7.8* 7.3* 8.8* 7.4* 10.1* 9.5* 9.0*  HCT 24.3* 23.3* 27.0* 23.1* 30.9* 30.0* 28.4*  PLT 254 264  --  286  --  327 354  MCV 90.3 89.6  --  89.9  --  90.9 91.9  MCH 29.0 28.1  --  28.8  --  28.8 29.1  MCHC 32.1 31.3  --  32.0  --  31.7 31.7  RDW 15.7* 16.3*  --  15.4  --  15.1 15.0  LYMPHSABS  --  1.3  --   --   --   --   --   MONOABS  --  1.0  --   --   --   --   --   EOSABS  --  0.2  --   --   --   --   --   BASOSABS  --  0.0  --   --   --   --   --     Chemistries  Recent Labs  Lab 04/26/19 0558 04/27/19 0609 04/28/19 0757 04/29/19 0702 04/30/19  1308 05/01/19 0606 05/02/19 0450  NA 137 138 138 139 140 141 143  K 3.9 3.9 3.9 3.7 3.5 3.4* 3.7  CL 107 106 105 107 109 110 110  CO2 23 24 23 23 25 24 24   GLUCOSE 152* 151* 185* 211* 144* 168* 142*  BUN 14 16 27* 26* 23 17 14   CREATININE 1.24 1.33* 2.01* 1.82* 1.53* 1.32* 1.22  CALCIUM 7.2* 7.4* 7.5* 7.6* 7.4* 7.4* 7.6*  MG 1.9 2.0 2.2  --  2.4  --  2.0  AST 25  --   --   --   --  42*  --   ALT 26  --   --   --   --  48*   --   ALKPHOS 83  --   --   --   --  68  --   BILITOT 0.6  --   --   --   --  0.3  --    ------------------------------------------------------------------------------------------------------------------ No results for input(s): CHOL, HDL, LDLCALC, TRIG, CHOLHDL, LDLDIRECT in the last 72 hours.  Lab Results  Component Value Date   HGBA1C 8.4 (H) 10/14/2016   ------------------------------------------------------------------------------------------------------------------ No results for input(s): TSH, T4TOTAL, T3FREE, THYROIDAB in the last 72 hours.  Invalid input(s): FREET3 ------------------------------------------------------------------------------------------------------------------ No results for input(s): VITAMINB12, FOLATE, FERRITIN, TIBC, IRON, RETICCTPCT in the last 72 hours.  Coagulation profile Recent Labs  Lab 04/29/19 1642  INR 1.2    No results for input(s): DDIMER in the last 72 hours.  Cardiac Enzymes No results for input(s): CKMB, TROPONINI, MYOGLOBIN in the last 168 hours.  Invalid input(s): CK ------------------------------------------------------------------------------------------------------------------    Component Value Date/Time   BNP 62.7 10/12/2016 1707     Danyal Whitenack M.D on 05/02/2019 at 2:32 PM  Go to [email or web address] - for contact info  [facility] - Office  [phone]

## 2019-05-02 NOTE — Care Management Important Message (Signed)
Important Message  Patient Details  Name: Stephen BEG Sr. MRN: [id number] Date of Birth: 15-Oct-1941   Medicare Important Message Given:  Yes    Tommy Medal 05/02/2019, 1:02 PM

## 2019-05-02 NOTE — Progress Notes (Signed)
Patient had large, loose bowel movement consisting of dark, bloody stool this AM.

## 2019-05-02 NOTE — Progress Notes (Signed)
Physical Therapy Treatment Patient Details Name: Stephen DRIPPS Sr. MRN: [id number] DOB: 01-Nov-1941 Today's Date: 05/02/2019    History of Present Illness Stephen Rogers  is a 78 y.o. male s/p Open cholecystectomy, partial colectomy, 04/23/19 with hx of hypertension, hyperlipidemia, Dm2 with CKD stage3, DVT in 2017 after sepsis/cholecystitis w perc cholecystostomy, h/o prior colonoscopy 2011=> diverticulosis, cecal adenoma, apparently c/o painless rectal bleeding x3. Slight abdominal discomfort around umbilicus earlier in the day but none presently.  Pt notes nausea.  Pt notes takes aspirin, but denies NSAIDS or blood thinners.  Pt denies dizziness,  Cp, palp, sob, emesis, diarrhea, black stool, dysuria.      PT Comments    Patient limited to taking a few slow labored steps at bedside due BLE weakness with poor standing balance, unable to extend trunk due to weakness and tolerated sitting up in chair after therapy.  Patient will benefit from continued physical therapy in hospital and recommended venue below to increase strength, balance, endurance for safe ADLs and gait.    Follow Up Recommendations  SNF     Equipment Recommendations  None recommended by PT    Recommendations for Other Services       Precautions / Restrictions Precautions Precautions: Fall Restrictions Weight Bearing Restrictions: No    Mobility  Bed Mobility Overal bed mobility: Needs Assistance Bed Mobility: Supine to Sit     Supine to sit: Min assist;Mod assist     General bed mobility comments: increased time, labored movement, required less assistance  Transfers Overall transfer level: Needs assistance Equipment used: Rolling walker (2 wheeled) Transfers: Sit to/from [facility] Sit to Stand: Mod assist;Max assist Stand pivot transfers: Mod assist       General transfer comment: difficulty with sit to stands due to BLE weakness, increased time to take steps during transfer to  chair  Ambulation/Gait Ambulation/Gait assistance: Mod assist;Max assist Gait Distance (Feet): 4 Feet Assistive device: Rolling walker (2 wheeled) Gait Pattern/deviations: Decreased step length - right;Decreased step length - left;Decreased stride length Gait velocity: slow   General Gait Details: limited to 4-5 slow labored steps with extremely kyphotic trunk, limited secondary to c/o fatigue and poor standing balance   Stairs             Wheelchair Mobility    Modified Rankin (Stroke Patients Only)       Balance Overall balance assessment: Needs assistance Sitting-balance support: Feet supported;No upper extremity supported Sitting balance-Leahy Scale: Fair     Standing balance support: Bilateral upper extremity supported;During functional activity Standing balance-Leahy Scale: Poor Standing balance comment: fair/poor using RW                            Cognition Arousal/Alertness: Awake/alert Behavior During Therapy: WFL for tasks assessed/performed Overall Cognitive Status: Within Functional Limits for tasks assessed                                        Exercises General Exercises - Lower Extremity Long Arc Quad: Seated;AAROM;AROM;Strengthening;Both;10 reps Hip Flexion/Marching: Seated;Strengthening;AROM;Both;10 reps Toe Raises: Seated;Strengthening;AROM;Both;10 reps Heel Raises: Seated;AROM;Strengthening;Both;10 reps    General Comments        Pertinent Vitals/Pain Pain Assessment: No/denies pain    Home Living                      Prior  Function            PT Goals (current goals can now be found in the care plan section) Acute Rehab PT Goals Patient Stated Goal: return home with family to assist PT Goal Formulation: With patient Time For Goal Achievement: 05/15/19 Potential to Achieve Goals: Good Progress towards PT goals: Progressing toward goals    Frequency    Min 3X/week      PT Plan  Current plan remains appropriate;Discharge plan needs to be updated    Co-evaluation              AM-PAC PT "6 Clicks" Mobility   Outcome Measure  Help needed turning from your back to your side while in a flat bed without using bedrails?: A Lot Help needed moving from lying on your back to sitting on the side of a flat bed without using bedrails?: A Lot Help needed moving to and from a bed to a chair (including a wheelchair)?: A Lot Help needed standing up from a chair using your arms (e.g., wheelchair or bedside chair)?: A Lot Help needed to walk in hospital room?: A Lot Help needed climbing 3-5 steps with a railing? : Total 6 Click Score: 11    End of Session Equipment Utilized During Treatment: Oxygen Activity Tolerance: Patient tolerated treatment well;Patient limited by fatigue Patient left: in chair;with call bell/phone within reach Nurse Communication: Mobility status PT Visit Diagnosis: Unsteadiness on feet (R26.81);Other abnormalities of gait and mobility (R26.89);Muscle weakness (generalized) (M62.81)     Time: 1594-7076 PT Time Calculation (min) (ACUTE ONLY): 27 min  Charges:  $Therapeutic Exercise: 8-22 mins $Therapeutic Activity: 8-22 mins                     2:42 PM, 05/02/19 Lonell Grandchild, MPT Physical Therapist with [facility] 336 [phone] office [phone] mobile phone

## 2019-05-02 NOTE — Progress Notes (Signed)
9 Days Post-Op  Subjective: Patient has no complaints.  Objective: Vital signs in last 24 hours: Temp:  [98.1 F (36.7 C)-99.9 F (37.7 C)] 99.9 F (37.7 C) (06/10 0736) Pulse Rate:  [78-98] 81 (06/10 0736) Resp:  [15-19] 18 (06/10 0736) BP: (125-158)/(66-80) 140/73 (06/10 0600) SpO2:  [97 %-100 %] 100 % (06/10 0736) Weight:  [103.5 kg] 103.5 kg (06/10 0500) Last BM Date: 05/01/19  Intake/Output from previous day: No intake/output data recorded. Intake/Output this shift: No intake/output data recorded.  General appearance: alert, cooperative and no distress GI: Soft, nontender, nondistended.  Incision healing well.  Lab Results:  Recent Labs    05/01/19 0606 05/02/19 0450  WBC 12.3* 13.9*  HGB 9.5* 9.0*  HCT 30.0* 28.4*  PLT 327 354   BMET Recent Labs    05/01/19 0606 05/02/19 0450  NA 141 143  K 3.4* 3.7  CL 110 110  CO2 24 24  GLUCOSE 168* 142*  BUN 17 14  CREATININE 1.32* 1.22  CALCIUM 7.4* 7.6*   PT/INR Recent Labs    04/29/19 1642  LABPROT 14.9  INR 1.2    Studies/Results: Dg Chest Port 1 View  Result Date: 04/30/2019 CLINICAL DATA:  Rectal bleeding.  Open cholecystectomy April 23, 2019. EXAM: PORTABLE CHEST 1 VIEW COMPARISON:  April 27, 2019 FINDINGS: Low lung volumes limit evaluation. Opacity remains in the right base, likely a small effusion with atelectasis. Infiltrate not excluded. No change in the cardiomediastinal silhouette. IMPRESSION: Mild opacity in the right base may represent a small effusion with atelectasis versus infiltrate. Recommend follow-up to resolution. Electronically Signed   By: Dorise Bullion III M.D   On: 04/30/2019 14:22    Anti-infectives: Anti-infectives (From admission, onward)   Start     Dose/Rate Route Frequency Ordered Stop   04/30/19 1600  vancomycin (VANCOCIN) 1,500 mg in sodium chloride 0.9 % 500 mL IVPB  Status:  Discontinued     1,500 mg 250 mL/hr over 120 Minutes Intravenous Every 24 hours 04/29/19 1558  05/01/19 0900   04/29/19 1600  vancomycin (VANCOCIN) IVPB 1000 mg/200 mL premix     1,000 mg 200 mL/hr over 60 Minutes Intravenous Every 1 hr x 2 04/29/19 1456 04/29/19 1802   04/27/19 1400  piperacillin-tazobactam (ZOSYN) IVPB 3.375 g  Status:  Discontinued     3.375 g 12.5 mL/hr over 240 Minutes Intravenous Every 8 hours 04/27/19 1015 05/01/19 0900   04/24/19 1030  ertapenem (INVANZ) 1,000 mg in sodium chloride 0.9 % 50 mL IVPB  Status:  Discontinued     1 g 100 mL/hr over 30 Minutes Intravenous Every 24 hours 04/23/19 1426 04/27/19 1015   04/23/19 1000  ertapenem (INVANZ) 1,000 mg in sodium chloride 0.9 % 100 mL IVPB     1 g 200 mL/hr over 30 Minutes Intravenous  Once 04/23/19 0930 04/23/19 1345   04/22/19 1030  ertapenem (INVANZ) 1,000 mg in sodium chloride 0.9 % 100 mL IVPB     1 g 200 mL/hr over 30 Minutes Intravenous On call to O.R. 04/22/19 1021 04/23/19 0559      Assessment/Plan: s/p Procedure(s): CHOLECYSTECTOMY PARTIAL COLECTOMY Impression: Patient seems to be having less bloody bowel movements.  His hemoglobin is staying relatively stable.  He did sit up in a chair yesterday.  He was tolerating full liquid diet well.  Will advance to a carb modified diet.  Will continue encouraging ambulation.  LOS: 12 days    Aviva Signs 05/02/2019

## 2019-05-03 ENCOUNTER — Inpatient Hospital Stay
Admission: RE | Admit: 2019-05-03 | Discharge: 2019-05-15 | Disposition: A | Discharge status: Home / Self Care | Payer: Medicare HMO | Source: Ambulatory Visit | Attending: Internal Medicine | Admitting: Internal Medicine

## 2019-05-03 DIAGNOSIS — R188 Other ascites: Secondary | ICD-10-CM | POA: Diagnosis not present

## 2019-05-03 DIAGNOSIS — K5900 Constipation, unspecified: Secondary | ICD-10-CM | POA: Diagnosis not present

## 2019-05-03 DIAGNOSIS — K823 Fistula of gallbladder: Secondary | ICD-10-CM | POA: Diagnosis not present

## 2019-05-03 DIAGNOSIS — R197 Diarrhea, unspecified: Secondary | ICD-10-CM | POA: Diagnosis not present

## 2019-05-03 DIAGNOSIS — I272 Pulmonary hypertension, unspecified: Secondary | ICD-10-CM | POA: Diagnosis present

## 2019-05-03 DIAGNOSIS — D500 Iron deficiency anemia secondary to blood loss (chronic): Secondary | ICD-10-CM | POA: Diagnosis not present

## 2019-05-03 DIAGNOSIS — T8149XA Infection following a procedure, other surgical site, initial encounter: Secondary | ICD-10-CM | POA: Diagnosis present

## 2019-05-03 DIAGNOSIS — R1110 Vomiting, unspecified: Secondary | ICD-10-CM | POA: Diagnosis not present

## 2019-05-03 DIAGNOSIS — I10 Essential (primary) hypertension: Secondary | ICD-10-CM | POA: Diagnosis not present

## 2019-05-03 DIAGNOSIS — M6281 Muscle weakness (generalized): Secondary | ICD-10-CM | POA: Diagnosis not present

## 2019-05-03 DIAGNOSIS — K632 Fistula of intestine: Secondary | ICD-10-CM | POA: Diagnosis not present

## 2019-05-03 DIAGNOSIS — Z86718 Personal history of other venous thrombosis and embolism: Secondary | ICD-10-CM | POA: Diagnosis not present

## 2019-05-03 DIAGNOSIS — E1165 Type 2 diabetes mellitus with hyperglycemia: Secondary | ICD-10-CM | POA: Diagnosis not present

## 2019-05-03 DIAGNOSIS — Z48815 Encounter for surgical aftercare following surgery on the digestive system: Secondary | ICD-10-CM | POA: Diagnosis not present

## 2019-05-03 DIAGNOSIS — D631 Anemia in chronic kidney disease: Secondary | ICD-10-CM | POA: Diagnosis not present

## 2019-05-03 DIAGNOSIS — Y838 Other surgical procedures as the cause of abnormal reaction of the patient, or of later complication, without mention of misadventure at the time of the procedure: Secondary | ICD-10-CM | POA: Diagnosis present

## 2019-05-03 DIAGNOSIS — Z86010 Personal history of colonic polyps: Secondary | ICD-10-CM | POA: Diagnosis not present

## 2019-05-03 DIAGNOSIS — K529 Noninfective gastroenteritis and colitis, unspecified: Secondary | ICD-10-CM | POA: Diagnosis present

## 2019-05-03 DIAGNOSIS — R140 Abdominal distension (gaseous): Secondary | ICD-10-CM | POA: Diagnosis not present

## 2019-05-03 DIAGNOSIS — K651 Peritoneal abscess: Secondary | ICD-10-CM | POA: Diagnosis not present

## 2019-05-03 DIAGNOSIS — D649 Anemia, unspecified: Secondary | ICD-10-CM | POA: Diagnosis not present

## 2019-05-03 DIAGNOSIS — R112 Nausea with vomiting, unspecified: Secondary | ICD-10-CM | POA: Diagnosis not present

## 2019-05-03 DIAGNOSIS — Z7984 Long term (current) use of oral hypoglycemic drugs: Secondary | ICD-10-CM | POA: Diagnosis not present

## 2019-05-03 DIAGNOSIS — Z9049 Acquired absence of other specified parts of digestive tract: Secondary | ICD-10-CM | POA: Diagnosis not present

## 2019-05-03 DIAGNOSIS — Z1159 Encounter for screening for other viral diseases: Secondary | ICD-10-CM | POA: Diagnosis not present

## 2019-05-03 DIAGNOSIS — E785 Hyperlipidemia, unspecified: Secondary | ICD-10-CM | POA: Diagnosis not present

## 2019-05-03 DIAGNOSIS — R159 Full incontinence of feces: Secondary | ICD-10-CM | POA: Diagnosis present

## 2019-05-03 DIAGNOSIS — K625 Hemorrhage of anus and rectum: Secondary | ICD-10-CM | POA: Diagnosis not present

## 2019-05-03 DIAGNOSIS — K3189 Other diseases of stomach and duodenum: Secondary | ICD-10-CM | POA: Diagnosis not present

## 2019-05-03 DIAGNOSIS — Z833 Family history of diabetes mellitus: Secondary | ICD-10-CM | POA: Diagnosis not present

## 2019-05-03 DIAGNOSIS — E1169 Type 2 diabetes mellitus with other specified complication: Secondary | ICD-10-CM | POA: Diagnosis not present

## 2019-05-03 DIAGNOSIS — Z20828 Contact with and (suspected) exposure to other viral communicable diseases: Secondary | ICD-10-CM | POA: Diagnosis not present

## 2019-05-03 DIAGNOSIS — Z8719 Personal history of other diseases of the digestive system: Secondary | ICD-10-CM | POA: Diagnosis not present

## 2019-05-03 DIAGNOSIS — R748 Abnormal levels of other serum enzymes: Secondary | ICD-10-CM | POA: Diagnosis not present

## 2019-05-03 DIAGNOSIS — T8143XA Infection following a procedure, organ and space surgical site, initial encounter: Secondary | ICD-10-CM | POA: Diagnosis not present

## 2019-05-03 DIAGNOSIS — Z79899 Other long term (current) drug therapy: Secondary | ICD-10-CM | POA: Diagnosis not present

## 2019-05-03 DIAGNOSIS — K921 Melena: Secondary | ICD-10-CM | POA: Diagnosis not present

## 2019-05-03 DIAGNOSIS — R600 Localized edema: Secondary | ICD-10-CM | POA: Diagnosis not present

## 2019-05-03 DIAGNOSIS — R945 Abnormal results of liver function studies: Secondary | ICD-10-CM | POA: Diagnosis not present

## 2019-05-03 DIAGNOSIS — K6811 Postprocedural retroperitoneal abscess: Secondary | ICD-10-CM | POA: Diagnosis not present

## 2019-05-03 DIAGNOSIS — E1122 Type 2 diabetes mellitus with diabetic chronic kidney disease: Secondary | ICD-10-CM | POA: Diagnosis not present

## 2019-05-03 DIAGNOSIS — N183 Chronic kidney disease, stage 3 (moderate): Secondary | ICD-10-CM | POA: Diagnosis not present

## 2019-05-03 DIAGNOSIS — Z8249 Family history of ischemic heart disease and other diseases of the circulatory system: Secondary | ICD-10-CM | POA: Diagnosis not present

## 2019-05-03 DIAGNOSIS — I129 Hypertensive chronic kidney disease with stage 1 through stage 4 chronic kidney disease, or unspecified chronic kidney disease: Secondary | ICD-10-CM | POA: Diagnosis not present

## 2019-05-03 DIAGNOSIS — K8010 Calculus of gallbladder with chronic cholecystitis without obstruction: Secondary | ICD-10-CM | POA: Diagnosis not present

## 2019-05-03 DIAGNOSIS — K6389 Other specified diseases of intestine: Secondary | ICD-10-CM | POA: Diagnosis not present

## 2019-05-03 DIAGNOSIS — Z7982 Long term (current) use of aspirin: Secondary | ICD-10-CM | POA: Diagnosis not present

## 2019-05-03 DIAGNOSIS — K219 Gastro-esophageal reflux disease without esophagitis: Secondary | ICD-10-CM | POA: Diagnosis not present

## 2019-05-03 DIAGNOSIS — R109 Unspecified abdominal pain: Secondary | ICD-10-CM | POA: Diagnosis not present

## 2019-05-03 LAB — CBC
HCT: 27.7 % — ABNORMAL LOW (ref 39.0–52.0)
Hemoglobin: 8.8 g/dL — ABNORMAL LOW (ref 13.0–17.0)
MCH: 29.2 pg (ref 26.0–34.0)
MCHC: 31.8 g/dL (ref 30.0–36.0)
MCV: 92 fL (ref 80.0–100.0)
Platelets: 365 10*3/uL (ref 150–400)
RBC: 3.01 MIL/uL — ABNORMAL LOW (ref 4.22–5.81)
RDW: 14.7 % (ref 11.5–15.5)
WBC: 13 10*3/uL — ABNORMAL HIGH (ref 4.0–10.5)
nRBC: 0 % (ref 0.0–0.2)

## 2019-05-03 LAB — GLUCOSE, CAPILLARY
Glucose-Capillary: 173 mg/dL — ABNORMAL HIGH (ref 70–99)
Glucose-Capillary: 239 mg/dL — ABNORMAL HIGH (ref 70–99)

## 2019-05-03 MED ORDER — VERAPAMIL HCL ER 120 MG PO TBCR: 120.0000 mg | EXTENDED_RELEASE_TABLET | Freq: Every day | ORAL | 1 refills | Status: DC

## 2019-05-03 MED ORDER — HYDRALAZINE HCL 100 MG PO TABS: 50.0000 mg | ORAL_TABLET | Freq: Three times a day (TID) | ORAL | 3 refills | Status: DC

## 2019-05-03 MED ORDER — PANTOPRAZOLE SODIUM 40 MG PO TBEC: 40.0000 mg | DELAYED_RELEASE_TABLET | Freq: Every day | ORAL | 2 refills | Status: DC

## 2019-05-03 MED ORDER — ONDANSETRON 4 MG PO TBDP: 4.0000 mg | ORAL_TABLET | Freq: Four times a day (QID) | ORAL | 0 refills | Status: DC | PRN

## 2019-05-03 MED ORDER — ASPIRIN EC 81 MG PO TBEC: 81.0000 mg | DELAYED_RELEASE_TABLET | Freq: Every day | ORAL | 1 refills | Status: DC

## 2019-05-03 MED ORDER — LISINOPRIL 40 MG PO TABS: 20.0000 mg | ORAL_TABLET | Freq: Every day | ORAL | 1 refills | Status: DC

## 2019-05-03 MED ORDER — ACETAMINOPHEN 325 MG PO TABS: 650.0000 mg | ORAL_TABLET | Freq: Four times a day (QID) | ORAL | 0 refills | Status: DC | PRN

## 2019-05-03 MED ORDER — FUROSEMIDE 20 MG PO TABS: 20.0000 mg | ORAL_TABLET | Freq: Every day | ORAL | 0 refills | Status: DC

## 2019-05-03 MED ORDER — HYDROCODONE-ACETAMINOPHEN 5-325 MG PO TABS: 1.0000 | ORAL_TABLET | ORAL | 0 refills | Status: DC | PRN

## 2019-05-03 NOTE — Progress Notes (Signed)
Patient alert and oriented x4. No complaints of pain, shortness of breath, chest pain, dizziness, nausea or vomiting. Patient tolerated po meds and diet well, appetite good. Nurse to nurse report called to Santiago Glad at the [facility] facility. IV's removed by nurse without complications. Patient discharged to [facility] with all belongings via wheelchair.

## 2019-05-03 NOTE — Discharge Instructions (Signed)
1)Repeat CBC and BMP on Monday 05/07/2019 2)Avoid ibuprofen/Advil/Aleve/Motrin/Goody Powders/Naproxen/BC powders/Meloxicam/Diclofenac/Indomethacin and other Nonsteroidal anti-inflammatory medications as these will make you more likely to bleed and can cause stomach ulcers, can also cause Kidney problems.  3) follow-up with general surgeon Dr. Aviva Signs in about 10 days for recheck

## 2019-05-03 NOTE — Progress Notes (Signed)
10 Days Post-Op  Subjective: Patient has no complaints.  Objective: Vital signs in last 24 hours: Temp:  [98.7 F (37.1 C)-99.4 F (37.4 C)] 99 F (37.2 C) (06/11 0735) Pulse Rate:  [80-111] 92 (06/11 0735) Resp:  [10-16] 10 (06/11 0735) BP: (116-138)/(50-69) 117/58 (06/11 0700) SpO2:  [90 %-99 %] 99 % (06/11 0735) Weight:  [103.8 kg] 103.8 kg (06/11 0404) Last BM Date: 05/02/19  Intake/Output from previous day: 06/10 0701 - 06/11 0700 In: -  Out: 500 [Urine:500] Intake/Output this shift: No intake/output data recorded.  General appearance: alert, cooperative and no distress GI: soft, non-tender; bowel sounds normal; no masses,  no organomegaly and Incision healing well  Lab Results:  Recent Labs    05/02/19 2316 05/03/19 0447  WBC 13.6* 13.0*  HGB 8.5* 8.8*  HCT 26.4* 27.7*  PLT 354 365   BMET Recent Labs    05/01/19 0606 05/02/19 0450  NA 141 143  K 3.4* 3.7  CL 110 110  CO2 24 24  GLUCOSE 168* 142*  BUN 17 14  CREATININE 1.32* 1.22  CALCIUM 7.4* 7.6*   PT/INR No results for input(s): LABPROT, INR in the last 72 hours.  Studies/Results: No results found.  Anti-infectives: Anti-infectives (From admission, onward)   Start     Dose/Rate Route Frequency Ordered Stop   04/30/19 1600  vancomycin (VANCOCIN) 1,500 mg in sodium chloride 0.9 % 500 mL IVPB  Status:  Discontinued     1,500 mg 250 mL/hr over 120 Minutes Intravenous Every 24 hours 04/29/19 1558 05/01/19 0900   04/29/19 1600  vancomycin (VANCOCIN) IVPB 1000 mg/200 mL premix     1,000 mg 200 mL/hr over 60 Minutes Intravenous Every 1 hr x 2 04/29/19 1456 04/29/19 1802   04/27/19 1400  piperacillin-tazobactam (ZOSYN) IVPB 3.375 g  Status:  Discontinued     3.375 g 12.5 mL/hr over 240 Minutes Intravenous Every 8 hours 04/27/19 1015 05/01/19 0900   04/24/19 1030  ertapenem (INVANZ) 1,000 mg in sodium chloride 0.9 % 50 mL IVPB  Status:  Discontinued     1 g 100 mL/hr over 30 Minutes Intravenous  Every 24 hours 04/23/19 1426 04/27/19 1015   04/23/19 1000  ertapenem (INVANZ) 1,000 mg in sodium chloride 0.9 % 100 mL IVPB     1 g 200 mL/hr over 30 Minutes Intravenous  Once 04/23/19 0930 04/23/19 1345   04/22/19 1030  ertapenem (INVANZ) 1,000 mg in sodium chloride 0.9 % 100 mL IVPB     1 g 200 mL/hr over 30 Minutes Intravenous On call to O.R. 04/22/19 1021 04/23/19 0559      Assessment/Plan: s/p Procedure(s): CHOLECYSTECTOMY PARTIAL COLECTOMY Impression: Stable on postoperative day 10.  Hemoglobin remaining stable.  Is starting to ambulate.  Is tolerating carb modified diet well.  Okay for transfer to regular floor from surgical standpoint.  Discharge planning ongoing.  LOS: 13 days    Aviva Signs 05/03/2019

## 2019-05-03 NOTE — Discharge Summary (Signed)
Stephen GULBRANSON Sr., is a 78 y.o. male  DOB 07-19-1941  MRN [id number].  Admission date:  04/18/2019  Admitting Physician  Jani Gravel, MD  Discharge Date:  05/03/2019   Primary MD  [name], MD  Recommendations for primary care physician for things to follow:  1)Repeat CBC and BMP on Monday 05/07/2019 2)Avoid ibuprofen/Advil/Aleve/Motrin/Goody Powders/Naproxen/BC powders/Meloxicam/Diclofenac/Indomethacin and other Nonsteroidal anti-inflammatory medications as these will make you more likely to bleed and can cause stomach ulcers, can also cause Kidney problems.  3) follow-up with general surgeon Dr. Aviva Signs in about 10 days for recheck   Admission Diagnosis  Acute GI bleeding [K92.2]   Discharge Diagnosis  Acute GI bleeding [K92.2]   Principal Problem:   Rectal bleeding Active Problems:   Diabetes mellitus ([place])   CKD (chronic kidney disease), stage III (HCC)   Anemia   History of cholecystitis   Calculus of gallbladder with cholecystitis without biliary obstruction   Choloenteric fistula      Past Medical History:  Diagnosis Date  . CKD (chronic kidney disease), stage III ([place])   . Diabetes mellitus   . DVT (deep venous thrombosis) ([place]) 10/14/2016  . Extremity edema 09/2016  . Hyperlipemia   . Hypertension     Past Surgical History:  Procedure Laterality Date  . CHOLECYSTECTOMY N/A 04/23/2019   Procedure: CHOLECYSTECTOMY;  Surgeon: Aviva Signs, MD;  Location: [facility];  Service: General;  Laterality: N/A;  . COLONOSCOPY  02/2010   Dr. [name]: diverticuolosis, multiple polyps removed, cecal adenoma.   . COLONOSCOPY WITH PROPOFOL N/A 04/19/2019   Procedure: COLONOSCOPY WITH PROPOFOL;  Surgeon: [name], MD;  Location: [facility];  Service: Endoscopy;  Laterality: N/A;  . IR GENERIC HISTORICAL  10/19/2016   IR PERC CHOLECYSTOSTOMY 10/19/2016 [name], MD [facility]  . IR GENERIC HISTORICAL  10/25/2016   IR CATHETER TUBE CHANGE 10/25/2016 [name], MD [facility]  . IR GENERIC HISTORICAL  11/30/2016   IR RADIOLOGIST EVAL & MGMT 11/30/2016 [facility]  . PARTIAL COLECTOMY N/A 04/23/2019   Procedure: PARTIAL COLECTOMY;  Surgeon: Aviva Signs, MD;  Location: [facility];  Service: General;  Laterality: N/A;  . POLYPECTOMY  04/19/2019   Procedure: POLYPECTOMY;  Surgeon: [name], MD;  Location: [facility];  Service: Endoscopy;;  polyp at recto sigmoid       HPI  from the history and physical done on the day of admission:   Stephen Rogers  is a 78 y.o. male,hypertension, hyperlipidemia, Dm2 with CKD stage3, DVT in 2017 after sepsis/cholecystitis w perc cholecystostomy, h/o prior colonoscopy 2011=> diverticulosis, cecal adenoma, apparently c/o painless rectal bleeding x3. Slight abdominal discomfort around umbilicus earlier in the day but none presently.  Pt notes nausea.  Pt notes takes aspirin, but denies NSAIDS or blood thinners.  Pt denies dizziness,  Cp, palp, sob, emesis, diarrhea, black stool, dysuria.    In ED,  T 97.6  P 119  R 12 Bp 118/70  Pox 93%  Wt 91.2kg  Wbc 13.5, Hgb 11.2 (prev 12.1, 8.4) INR 1.0 Type and screen pending Na 142,  K 4.4  Bun 21, Creatinine 1.63 Alb 3.6 Ast 31, Alt 25  Pt made NPO,  GI consulted, appreciate input.   Pt started on ns at 68ml per hour in ED.   Pt will be admitted for rectal bleeding.    ;  Hospital Course:    Principal Problem:   Rectal bleeding Active Problems:   Diabetes mellitus ([place] [facility])   CKD (chronic kidney disease), stage III (HCC)   Anemia   History of cholecystitis   Calculus of gallbladder with cholecystitis without biliary obstruction   Choloenteric fistula  A/p 1)Painless Rectal Bleeding--- General surgery input appreciated,  received 5 units of PRBCs to date, patient had brown stool with no further bleeding, hemoglobin stable above 8... Follow-up with general  surgeon Dr. [name] as advised.  Repeat CBC on Monday, 05/07/2019  2) status post open cholecystectomy and partial colectomy for Chole-colo-fistula---  S/p surgery on 04/23/2019, general surgeon is following and managing  3) left knee pain--status post left knee arthrocentesis on 04/29/2019 with findings of inflammatory osteoarthritis without septic arthritis or gout  4)HTN--stable continue verapamil 120 mg, lisinopril, hydralazine and Aldactone  5) acute blood loss anemia secondary to #1 above--- management as above #1  6)DM2--no recent A1c available, Use Novolog/Humalog Sliding scale insulin with Accu-Cheks/Fingersticks as ordered  7)Generalized weakness/debility--- physical therapy evaluation appreciated and recommend SNF rehab   Disposition-discharge to SNF rehab  Code Status : Full  Family Communication:   Na (patient is coherent)   Disposition Plan  : SNF  Consults  :  Gen surgery/Gi  DVT Prophylaxis  :  - SCDs (Rectal bleeding)    Discharge Condition: stable  Follow UP  Contact information for after-discharge care    [facility] .   Service: Skilled Nursing Contact information: [address] [place] [phone]               Consults obtained - Gi/gen surgery  Diet and Activity recommendation:  As advised  Discharge Instructions    Discharge Instructions    Call MD for:  difficulty breathing, headache or visual disturbances   Complete by: As directed    Call MD for:  persistant dizziness or light-headedness   Complete by: As directed    Call MD for:  persistant nausea and vomiting   Complete by: As directed    Call MD for:  severe uncontrolled pain   Complete by: As directed    Call MD for:  temperature >100.4   Complete by: As directed    Diet - low sodium heart healthy   Complete by: As directed    Discharge instructions   Complete by: As directed    1)Repeat  CBC and BMP on Monday 05/07/2019 2)Avoid ibuprofen/Advil/Aleve/Motrin/Goody Powders/Naproxen/BC powders/Meloxicam/Diclofenac/Indomethacin and other Nonsteroidal anti-inflammatory medications as these will make you more likely to bleed and can cause stomach ulcers, can also cause Kidney problems.  3) follow-up with general surgeon Dr. Aviva Signs in about 10 days for recheck   Increase activity slowly   Complete by: As directed         Discharge Medications     Allergies as of 05/03/2019   No Known Allergies     Medication List    TAKE these medications   acetaminophen 325 MG tablet Commonly known as: TYLENOL Take 2 tablets (650 mg  total) by mouth every 6 (six) hours as needed for mild pain, fever or headache (or Fever >/= 101).   aspirin EC 81 MG tablet Take 1 tablet (81 mg total) by mouth daily with breakfast. Start taking on: May 07, 2019 What changed:   when to take this  These instructions start on May 07, 2019. If you are unsure what to do until then, ask your doctor or other care provider.   atorvastatin 40 MG tablet Commonly known as: LIPITOR Take 40 mg by mouth daily.   furosemide 20 MG tablet Commonly known as: LASIX Take 1 tablet (20 mg total) by mouth daily with breakfast. What changed: when to take this   glipiZIDE 10 MG tablet Commonly known as: GLUCOTROL Take 10 mg by mouth 2 (two) times daily before a meal.   hydrALAZINE 100 MG tablet Commonly known as: APRESOLINE Take 0.5 tablets (50 mg total) by mouth 3 (three) times daily. What changed: how much to take   HYDROcodone-acetaminophen 5-325 MG tablet Commonly known as: NORCO/VICODIN Take 1 tablet by mouth every 4 (four) hours as needed for moderate pain.   lisinopril 40 MG tablet Commonly known as: ZESTRIL Take 0.5 tablets (20 mg total) by mouth daily. What changed: how much to take   metFORMIN 500 MG tablet Commonly known as: GLUCOPHAGE Take 500 mg by mouth 2 (two) times a day.    ondansetron 4 MG disintegrating tablet Commonly known as: ZOFRAN-ODT Take 1 tablet (4 mg total) by mouth every 6 (six) hours as needed for nausea.   pantoprazole 40 MG tablet Commonly known as: PROTONIX Take 1 tablet (40 mg total) by mouth daily. Start taking on: May 04, 2019   spironolactone 25 MG tablet Commonly known as: ALDACTONE Take 25 mg by mouth daily.   verapamil 120 MG CR tablet Commonly known as: CALAN-SR Take 1 tablet (120 mg total) by mouth daily. What changed:   medication strength  how much to take  when to take this       Major procedures and Radiology Reports - PLEASE review detailed and final reports for all details, in brief -     Dg Knee 1-2 Views Left  Result Date: 04/29/2019 CLINICAL DATA:  Left knee pain and swelling. Fluid aspirated from knee today. Daughter. EXAM: LEFT KNEE - 1-2 VIEW COMPARISON:  None. FINDINGS: Mild to moderate tricompartmental osteoarthritic changes present. There is evidence of a small joint effusion. No evidence of fracture or dislocation. IMPRESSION: No acute findings. Small joint effusion.  Mild to moderate osteoarthritis. Electronically Signed   By: Marin Olp M.D.   On: 04/29/2019 18:17   Ct Abdomen Pelvis W Contrast  Result Date: 04/19/2019 CLINICAL DATA:  Rectal bleeding and anemia. EXAM: CT ABDOMEN AND PELVIS WITH CONTRAST TECHNIQUE: Multidetector CT imaging of the abdomen and pelvis was performed using the standard protocol following bolus administration of intravenous contrast. CONTRAST:  165mL OMNIPAQUE IOHEXOL 300 MG/ML  SOLN COMPARISON:  CT dated 10/22/2016. FINDINGS: Lower chest: There is bilateral gynecomastia. The heart is enlarged. Coronary artery calcifications are noted. Hepatobiliary: The liver is unremarkable. The gallbladder is diffusely abnormal with multiple pockets of gas and hyperdense material. The gallbladder wall does not appear significantly thickened. The common bile duct is mildly dilated. There  appears to be a fistula tract from the gallbladder to the hepatic flexure of the colon. This can be best visualized on the coronal and sagittal series. An old cholecystostomy tube drain track is noted coursing towards the anterior  abdominal wall. Pancreas: Unremarkable. No pancreatic ductal dilatation or surrounding inflammatory changes. Spleen: Normal in size without focal abnormality. Adrenals/Urinary Tract: The left kidney is unremarkable aside from a small cyst arising from the lower pole. There is a slightly complex 2.3 cm cystic structure in the upper pole the right kidney. Both adrenal glands are unremarkable. There is no hydronephrosis. The urinary bladder is significantly distended with layering hyperdense material within the dependent portion, likely related to prior contrast injection. Stomach/Bowel: There are scattered colonic diverticula without CT evidence of diverticulitis. There are air-fluid levels in the colon which are likely related to prior colonoscopy prep. The appendix is unremarkable. The stomach is grossly unremarkable. There is no evidence of a small-bowel obstruction. Vascular/Lymphatic: Aortic atherosclerosis. No enlarged abdominal or pelvic lymph nodes. Reproductive: The prostate gland is significantly enlarged. Other: No abdominal wall hernia or abnormality. No abdominopelvic ascites. Musculoskeletal: Multilevel degenerative changes are noted of the thoracolumbar spine. IMPRESSION: 1. Findings highly suspicious for a fistula track between the gallbladder lumen and hepatic flexure of the colon. There are multiple pockets of gas and debris within the gallbladder lumen. There is no definite CT evidence of acute cholecystitis. 2. Moderate dilatation of the common bile duct. Correlation with laboratory studies is recommended to help exclude an obstructing process. 3. Significantly distended urinary bladder. 4. No specific abnormality identified to explain the patient's painless hematochezia.  5. Cardiomegaly.  Bilateral gynecomastia is noted. Electronically Signed   By: Constance Holster M.D.   On: 04/19/2019 21:41   US Renal  Result Date: 04/28/2019 CLINICAL DATA:  Acute renal insufficiency. Patient currently admitted. Diabetes. EXAM: RENAL / URINARY TRACT ULTRASOUND COMPLETE COMPARISON:  CT 04/19/2019 FINDINGS: Right Kidney: Renal measurements: 10.8 x 5.5 x 6.8 cm = volume: 210 mL . Echogenicity within normal limits. No mass or hydronephrosis visualized. 2.3 cm upper pole cyst. Left Kidney: Renal measurements: 9.0 x 6.0 x 5.1 cm = volume: 144 mL. Echogenicity within normal limits. No mass or hydronephrosis visualized. 2.6 cm midpole cyst and 2.5 cm upper pole cyst. Bladder: Appears normal for degree of bladder distention. IMPRESSION: Normal size kidneys without hydronephrosis.  Bilateral renal cysts. Electronically Signed   By: Marin Olp M.D.   On: 04/28/2019 16:26   Dg Chest Port 1 View  Result Date: 04/30/2019 CLINICAL DATA:  Rectal bleeding.  Open cholecystectomy April 23, 2019. EXAM: PORTABLE CHEST 1 VIEW COMPARISON:  April 27, 2019 FINDINGS: Low lung volumes limit evaluation. Opacity remains in the right base, likely a small effusion with atelectasis. Infiltrate not excluded. No change in the cardiomediastinal silhouette. IMPRESSION: Mild opacity in the right base may represent a small effusion with atelectasis versus infiltrate. Recommend follow-up to resolution. Electronically Signed   By: Dorise Bullion III M.D   On: 04/30/2019 14:22   Dg Chest Port 1 View  Result Date: 04/27/2019 CLINICAL DATA:  Fever. EXAM: PORTABLE CHEST 1 VIEW COMPARISON:  10/21/2016. FINDINGS: Again demonstrated is a very poor inspiration. Mild-to-moderate bibasilar atelectasis. Mild peribronchial thickening. Stable borderline enlarged cardiac silhouette. Unremarkable bones. IMPRESSION: 1. Poor inspiration with mild to moderate bibasilar atelectasis. 2. Mild bronchitic changes. Electronically Signed   By: Claudie Revering M.D.   On: 04/27/2019 13:28    Micro Results    Recent Results (from the past 240 hour(s))  Culture, Urine     Status: None   Collection Time: 04/27/19 11:13 AM   Specimen: Urine, Clean Catch  Result Value Ref Range Status   Specimen Description   Final  URINE, CLEAN CATCH Performed at [facility], [address] [place], [place] [address]    Special Requests   Final    NONE Performed at [facility], [address], [place], [place] [address]    Culture   Final    NO GROWTH Performed at [facility], [place] [address], [place], [place] [address]    Report Status 04/28/2019 FINAL  Final  Culture, blood (routine x 2)     Status: None (Preliminary result)   Collection Time: 04/29/19  2:53 PM   Specimen: BLOOD RIGHT ARM  Result Value Ref Range Status   Specimen Description   Final    BLOOD RIGHT ARM BOTTLES DRAWN AEROBIC AND ANAEROBIC   Special Requests Blood Culture adequate volume  Final   Culture   Final    NO GROWTH 4 DAYS Performed at [facility], [address], [place], [place] [address]    Report Status PENDING  Incomplete  Culture, blood (routine x 2)     Status: None (Preliminary result)   Collection Time: 04/29/19  2:54 PM   Specimen: BLOOD  Result Value Ref Range Status   Specimen Description BLOOD  Final   Special Requests NONE  Final   Culture   Final    NO GROWTH 4 DAYS Performed at [facility], [address] [place], [place] [address]    Report Status PENDING  Incomplete  Stat Gram stain     Status: None   Collection Time: 04/29/19  4:27 PM   Specimen: KNEE; Body Fluid  Result Value Ref Range Status   Specimen Description KNEE  Final   Special Requests Normal  Final   Gram Stain   Final    CYTOSPIN SMEAR NO ORGANISMS SEEN WBC PRESENT, PREDOMINANTLY PMN Performed at [facility], [address] [place], [place] [address]    Report Status 04/29/2019 FINAL  Final  Culture, body fluid-bottle     Status: None (Preliminary result)    Collection Time: 04/29/19  4:27 PM   Specimen: Synovium  Result Value Ref Range Status   Specimen Description SYNOVIAL BOTTLES DRAWN AEROBIC AND ANAEROBIC  Final   Special Requests 10CC  Final   Culture   Final    NO GROWTH 4 DAYS Performed at [facility], [address], [place], [place] [address]    Report Status PENDING  Incomplete       Today   Subjective    [name] today has no new complaints.... Eating and  drinking well, had nonbloody stool, no fever no chills         Patient has been seen and examined prior to discharge   Objective   Blood pressure (!) 148/82, pulse 93, temperature 98.9 F (37.2 C), temperature source Oral, resp. rate 19, height 5\' 6"  (1.676 m), weight 103.8 kg, SpO2 93 %.   Intake/Output Summary (Last 24 hours) at 05/03/2019 1347 Last data filed at 05/03/2019 0800 Gross per 24 hour  Intake -  Output 800 ml  Net -800 ml    Exam Gen:- Awake Alert,  In no apparent distress  HEENT:- [place].AT, No sclera icterus Neck-Supple Neck,No JVD,.  Lungs-  CTAB , fair symmetrical air movement CV- S1, S2 normal, regular  Abd-  +ve B.Sounds, Abd Soft, No significant tenderness, healing abdominal wound with intact staples, Extremity/Skin:- No  edema, pedal pulses present  Psych-affect is appropriate, oriented x3 Neuro-generalized weakness without new focal deficits, no tremors   Data Review   CBC  w Diff:  Lab Results  Component Value Date   WBC 13.0 (H) 05/03/2019   HGB 8.8 (L) 05/03/2019   HCT 27.7 (L) 05/03/2019   PLT 365 05/03/2019   LYMPHOPCT 12 04/29/2019   MONOPCT 9 04/29/2019   EOSPCT 1 04/29/2019   BASOPCT 0 04/29/2019    CMP:  Lab Results  Component Value Date   NA 143 05/02/2019   K 3.7 05/02/2019   CL 110 05/02/2019   CO2 24 05/02/2019   BUN 14 05/02/2019   CREATININE 1.22 05/02/2019   PROT 5.4 (L) 05/01/2019   ALBUMIN 1.9 (L) 05/01/2019   BILITOT 0.3 05/01/2019   ALKPHOS 68 05/01/2019   AST 42 (H) 05/01/2019   ALT 48  (H) 05/01/2019  .   Total Discharge time is about 33 minutes  Roxan Hockey M.D on 05/03/2019 at 1:47 PM  Go to [email or web address] -  for contact info  [facility] - Office  [phone]

## 2019-05-04 ENCOUNTER — Encounter: Payer: Self-pay | Admitting: Adult Health

## 2019-05-04 ENCOUNTER — Non-Acute Institutional Stay (SKILLED_NURSING_FACILITY): Payer: Medicare HMO | Admitting: Adult Health

## 2019-05-04 DIAGNOSIS — E785 Hyperlipidemia, unspecified: Secondary | ICD-10-CM | POA: Diagnosis not present

## 2019-05-04 DIAGNOSIS — K219 Gastro-esophageal reflux disease without esophagitis: Secondary | ICD-10-CM | POA: Diagnosis not present

## 2019-05-04 DIAGNOSIS — D649 Anemia, unspecified: Secondary | ICD-10-CM | POA: Diagnosis not present

## 2019-05-04 DIAGNOSIS — I129 Hypertensive chronic kidney disease with stage 1 through stage 4 chronic kidney disease, or unspecified chronic kidney disease: Secondary | ICD-10-CM

## 2019-05-04 DIAGNOSIS — K8010 Calculus of gallbladder with chronic cholecystitis without obstruction: Secondary | ICD-10-CM | POA: Diagnosis not present

## 2019-05-04 DIAGNOSIS — R600 Localized edema: Secondary | ICD-10-CM

## 2019-05-04 DIAGNOSIS — E1169 Type 2 diabetes mellitus with other specified complication: Secondary | ICD-10-CM | POA: Diagnosis not present

## 2019-05-04 DIAGNOSIS — E1122 Type 2 diabetes mellitus with diabetic chronic kidney disease: Secondary | ICD-10-CM | POA: Diagnosis not present

## 2019-05-04 DIAGNOSIS — N183 Chronic kidney disease, stage 3 unspecified: Secondary | ICD-10-CM

## 2019-05-04 DIAGNOSIS — K823 Fistula of gallbladder: Secondary | ICD-10-CM

## 2019-05-04 LAB — CULTURE, BLOOD (ROUTINE X 2)
Culture: NO GROWTH
Culture: NO GROWTH
Special Requests: ADEQUATE

## 2019-05-04 LAB — CULTURE, BODY FLUID W GRAM STAIN -BOTTLE: Culture: NO GROWTH

## 2019-05-04 NOTE — Progress Notes (Signed)
Location:   [place] Room Number: D Place of Service:  SNF (31)   CODE STATUS: Full Code  No Known Allergies  Chief Complaint  Patient presents with  . Hospitalization Follow-up    Hospital follow up    HPI:  Stephen Rogers is a 78 year old man who has been hospitalized from 04-18-19 through 05-03-19. Stephen Rogers presented to the ED for painless rectal bleeding. Stephen Rogers had a open cholecystectomy and partial colectomy on 04-23-19 for a chole-colo-fistula. Stephen Rogers required 5 units PRBC. Stephen Rogers had left knee pain and swelling is status post left knee arthrocentesis no septic arthritis or gout.  Stephen Rogers is here for short term rehab with his goal to return back home. Stephen Rogers denies any pain; at this time. Stephen Rogers wants a bland diet for better tolerability. Stephen Rogers denies any insomnia. Stephen Rogers denies rectal bleeding. Stephen Rogers will continue to be followed for his chronic illnesses including: diabetes; hypertension; gerd.   Past Medical History:  Diagnosis Date  . CKD (chronic kidney disease), stage III ([place])   . Diabetes mellitus   . DVT (deep venous thrombosis) ([place]) 10/14/2016  . Extremity edema 09/2016  . Hyperlipemia   . Hypertension     Past Surgical History:  Procedure Laterality Date  . CHOLECYSTECTOMY N/A 04/23/2019   Procedure: CHOLECYSTECTOMY;  Surgeon: Aviva Signs, MD;  Location: [facility];  Service: General;  Laterality: N/A;  . COLONOSCOPY  02/2010   Dr. [name]: diverticuolosis, multiple polyps removed, cecal adenoma.   . COLONOSCOPY WITH PROPOFOL N/A 04/19/2019   Procedure: COLONOSCOPY WITH PROPOFOL;  Surgeon: [name], MD;  [facility]: [facility];  Service: Endoscopy;  Laterality: N/A;  . IR GENERIC HISTORICAL  10/19/2016   IR PERC CHOLECYSTOSTOMY 10/19/2016 [name], MD [facility]  . IR GENERIC HISTORICAL  10/25/2016   IR CATHETER TUBE CHANGE 10/25/2016 [name], MD [facility]  . IR GENERIC HISTORICAL  11/30/2016   IR RADIOLOGIST EVAL & MGMT 11/30/2016 [facility]  . PARTIAL  COLECTOMY N/A 04/23/2019   Procedure: PARTIAL COLECTOMY;  Surgeon: Aviva Signs, MD;  Location: [facility];  Service: General;  Laterality: N/A;  . POLYPECTOMY  04/19/2019   Procedure: POLYPECTOMY;  Surgeon: [name], MD;  [facility];  Service: Endoscopy;;  polyp at recto sigmoid    Social History   Socioeconomic History  . Marital status: Widowed    Spouse name: Not on file  . Number of children: Not on file  . Years of education: Not on file  . Highest education level: Not on file  Occupational History  . Not on file  Social Needs  . Financial resource strain: Not on file  . Food insecurity    Worry: Not on file    Inability: Not on file  . Transportation needs    Medical: Not on file    Non-medical: Not on file  Tobacco Use  . Smoking status: Never Smoker  . Smokeless tobacco: Never Used  Substance and Sexual Activity  . Alcohol use: No  . Drug use: No  . Sexual activity: Not on file  Lifestyle  . Physical activity    Days per week: Not on file    Minutes per session: Not on file  . Stress: Not on file  Relationships  . Social Herbalist on phone: Not on file    Gets together: Not on file    Attends religious service: Not on file    Active  member of club or organization: Not on file    Attends meetings of clubs or organizations: Not on file    Relationship status: Not on file  . Intimate partner violence    Fear of current or ex partner: Not on file    Emotionally abused: Not on file    Physically abused: Not on file    Forced sexual activity: Not on file  Other Topics Concern  . Not on file  Social History Narrative  . Not on file   Family History  Problem Relation Age of Onset  . Heart attack Other   . Tuberculosis Other   . Tuberculosis Other   . Diabetes type II Father   . Hypertension Father   . Heart attack Maternal Aunt       VITAL SIGNS BP 122/81   Pulse 93   Temp 98.9 F (37.2 C)   Resp 17   Ht 5\' 6"  (1.676 m)    Wt 218 lb (98.9 kg)   BMI 35.19 kg/m   Outpatient Encounter Medications as of 05/04/2019  Medication Sig  . acetaminophen (TYLENOL) 325 MG tablet Take 2 tablets (650 mg total) by mouth every 6 (six) hours as needed for mild pain, fever or headache (or Fever >/= 101).  Derrill Memo ON 05/07/2019] aspirin EC 81 MG tablet Take 1 tablet (81 mg total) by mouth daily with breakfast.  . atorvastatin (LIPITOR) 40 MG tablet Take 40 mg by mouth daily.  . furosemide (LASIX) 20 MG tablet Take 1 tablet (20 mg total) by mouth daily with breakfast.  . glipiZIDE (GLUCOTROL) 10 MG tablet Take 10 mg by mouth 2 (two) times daily before a meal.   . hydrALAZINE (APRESOLINE) 100 MG tablet Take 0.5 tablets (50 mg total) by mouth 3 (three) times daily.  Marland Kitchen HYDROcodone-acetaminophen (NORCO/VICODIN) 5-325 MG tablet Take 1 tablet by mouth every 4 (four) hours as needed for moderate pain.  Marland Kitchen lisinopril (ZESTRIL) 40 MG tablet Take 0.5 tablets (20 mg total) by mouth daily.  . metFORMIN (GLUCOPHAGE) 500 MG tablet Take 500 mg by mouth 2 (two) times a day.  . NON FORMULARY Diet Type:  NAS, Consistent Carbohydrate  . ondansetron (ZOFRAN-ODT) 4 MG disintegrating tablet Take 1 tablet (4 mg total) by mouth every 6 (six) hours as needed for nausea.  . pantoprazole (PROTONIX) 40 MG tablet Take 1 tablet (40 mg total) by mouth daily.  Marland Kitchen spironolactone (ALDACTONE) 25 MG tablet Take 25 mg by mouth daily.  . verapamil (CALAN-SR) 120 MG CR tablet Take 1 tablet (120 mg total) by mouth daily.   No facility-administered encounter medications on file as of 05/04/2019.      SIGNIFICANT DIAGNOSTIC EXAMS  TODAY;   04-19-19: ct of abdomen and pelvis:  1. Findings highly suspicious for a fistula track between the gallbladder lumen and hepatic flexure of the colon. There are multiple pockets of gas and debris within the gallbladder lumen. There is no definite CT evidence of acute cholecystitis. 2. Moderate dilatation of the common bile duct.  Correlation with laboratory studies is recommended to help exclude an obstructing process. 3. Significantly distended urinary bladder. 4. No specific abnormality identified to explain the patient's painless hematochezia. 5. Cardiomegaly.  Bilateral gynecomastia is noted.  04-19-19: colonoscopy: One 4 mm polyp at the recto-sigmoid colon, removed with a cold snare. Resected andvretrieved. Colonic lipoma. Markedly abnormal area of colon at the hepatic flexure of uncertain etiology. (Query fistula,vatypical presentation for infiltrating tumor or possibly related to previously  placedvcholecystostomy tube). This area is suspicious for site of bleeding.  04-28-19: renal ultrasound: Normal size kidneys without hydronephrosis.  Bilateral renal cysts.  04-29-19 left knee x-ray: No acute findings. Small joint effusion.  Mild to moderate osteoarthritis.  04-30-19: chest x-ray: Mild opacity in the right base may represent a small effusion with atelectasis versus infiltrate. Recommend follow-up to resolution.   LABS REVIEWED TODAY;   04-18-19: wbc 13.5; hgb 11.1; hct 36.2; mcv 96.5 plt 288; glucose 191; bun 21; creat 1.63; k+ 4.4; an++ 142; ca 8.6; liver normal albumin 3.6 04-20-19: wbc 6.4; hgb 9.0; hct 28.6; mcv 93.5; plt 219; glucose 147; bun 12; creat 1.15; k+ 4.2; na++ 138; ca 8.2; liver normal albumin 3.3 04-22-19: wbc 7.2; hgb 9.4; hct 29.6 plt 293 04-24-19; wbc 12.2; hgb 8.9; hct  28.7 mcv 95.7; plt 240; glucose 227; bun 19; creat 1.48; k+ 5.4; na++ 136; ca 7.4; liver normal albumin 2.8  04-26-19; wbc 12.8; hgb 7.1; hct 23.0; mcv 94.3; plt 218; glucose 152; bun 14; creat 1.24; k+ 3.9; na++ 137; ca 7.2; liver normal albumin 2.4 mag 1.9 04-28-19; wbc 12.1; hgb 7.8; hct 24.3 mcv 90.3; plt 254; glucose 185; bun 27; creat 2.01; k+ 3.9; na++ 138 ca 7.5 mag 2.2 04-29-19; sed rate 137 05-01-19; wbc 12.3; hgb 9.5; hct 30.0; mcv 90.9; plt 327; glucose 168; bun 17; creat 1.32; k+ 3.4; na++ 141; ca 7.4; liver normal albumin  1.9;  05-03-19: wbc 13.0; hgb 8.8; hct 27.7; mcv 92.0 plt 365    Review of Systems  Constitutional: Negative for malaise/fatigue.  Respiratory: Negative for cough and shortness of breath.   Cardiovascular: Negative for chest pain, palpitations and leg swelling.  Gastrointestinal: Negative for abdominal pain, constipation and heartburn.  Musculoskeletal: Negative for back pain, joint pain and myalgias.  Skin: Negative.   Neurological: Negative for dizziness.  Psychiatric/Behavioral: The patient is not nervous/anxious.      Physical Exam Constitutional:      General: Stephen Rogers is not in acute distress.    Appearance: Stephen Rogers is well-developed. Stephen Rogers is obese. Stephen Rogers is not diaphoretic.  Neck:     Musculoskeletal: Neck supple.     Thyroid: No thyromegaly.  Cardiovascular:     Rate and Rhythm: Normal rate. Rhythm irregular.     Pulses: Normal pulses.     Heart sounds: Normal heart sounds.  Pulmonary:     Effort: Pulmonary effort is normal. No respiratory distress.     Breath sounds: Normal breath sounds.  Abdominal:     General: Bowel sounds are normal. There is no distension.     Palpations: Abdomen is soft.     Tenderness: There is no abdominal tenderness.  Musculoskeletal: Normal range of motion.        General: Swelling present.     Right lower leg: No edema.     Left lower leg: No edema.     Comments: Left knee swelling present: history of aspiration   Lymphadenopathy:     Cervical: No cervical adenopathy.  Skin:    General: Skin is warm and dry.  Neurological:     Mental Status: Stephen Rogers is alert and oriented to person, place, and time.  Psychiatric:        Mood and Affect: Mood normal.      ASSESSMENT/ PLAN:  TODAY:   1. Controlled type 2 diabetes mellitus with stage 3 chronic renal disease without long term current use of insulin: is stable will continue metformin 500 mg twice daily and  glipizide 10 mg twice daily will monitor her status.  Is on ace statin to restart asa on 05-07-19   2. CKD stage 3 due to type 2 diabetes mellitus: is stable bun 17 creat 1.32 will monitor  3.  Dyslipidemia associated with type 2 diabetes mellitus: is stable will continue lipitor 40 mg daily   4. Hypertension associated with stage 3 chronic kidney disease due to type 2 diabetes mellitus: is stable b/p 122/81 will continue lisnopril 20 mg daily apresoline 50 mg twice daily aldactone 25 mg daily and calan sr 120 mg daily   5. Bilateral lower extremity edema: is stable will continue lasix 20 mg daily   6. GERD without esophagitis: is stable will continue protonix 40 mg daily  7. 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   On 05-07-19: will check cbc bmp     MD is aware of resident's narcotic use and is in agreement with current plan of care. We will attempt to wean resident as apropriate   Ok Edwards NP [facility]  Contact [phone] Monday through Friday 8am- 5pm  After hours call [phone]

## 2019-05-07 ENCOUNTER — Encounter (HOSPITAL_COMMUNITY)
Admission: RE | Admit: 2019-05-07 | Discharge: 2019-05-07 | Disposition: A | Discharge status: Home / Self Care | Payer: Medicare HMO | Source: Skilled Nursing Facility | Attending: Adult Health | Admitting: Adult Health

## 2019-05-07 ENCOUNTER — Ambulatory Visit (HOSPITAL_COMMUNITY)
Admission: RE | Admit: 2019-05-07 | Discharge: 2019-05-07 | Disposition: A | Discharge status: Home / Self Care | Payer: Medicare HMO | Source: Ambulatory Visit | Attending: Internal Medicine | Admitting: Internal Medicine

## 2019-05-07 ENCOUNTER — Non-Acute Institutional Stay (SKILLED_NURSING_FACILITY): Payer: Medicare HMO | Admitting: Internal Medicine

## 2019-05-07 ENCOUNTER — Encounter: Payer: Self-pay | Admitting: Internal Medicine

## 2019-05-07 DIAGNOSIS — E1169 Type 2 diabetes mellitus with other specified complication: Secondary | ICD-10-CM

## 2019-05-07 DIAGNOSIS — K3189 Other diseases of stomach and duodenum: Secondary | ICD-10-CM | POA: Diagnosis not present

## 2019-05-07 DIAGNOSIS — R109 Unspecified abdominal pain: Secondary | ICD-10-CM | POA: Diagnosis not present

## 2019-05-07 DIAGNOSIS — R140 Abdominal distension (gaseous): Secondary | ICD-10-CM | POA: Insufficient documentation

## 2019-05-07 DIAGNOSIS — I129 Hypertensive chronic kidney disease with stage 1 through stage 4 chronic kidney disease, or unspecified chronic kidney disease: Secondary | ICD-10-CM

## 2019-05-07 DIAGNOSIS — R600 Localized edema: Secondary | ICD-10-CM | POA: Diagnosis not present

## 2019-05-07 DIAGNOSIS — E785 Hyperlipidemia, unspecified: Secondary | ICD-10-CM

## 2019-05-07 DIAGNOSIS — R1110 Vomiting, unspecified: Secondary | ICD-10-CM | POA: Insufficient documentation

## 2019-05-07 DIAGNOSIS — K625 Hemorrhage of anus and rectum: Secondary | ICD-10-CM | POA: Insufficient documentation

## 2019-05-07 DIAGNOSIS — K6389 Other specified diseases of intestine: Secondary | ICD-10-CM | POA: Diagnosis not present

## 2019-05-07 DIAGNOSIS — E1122 Type 2 diabetes mellitus with diabetic chronic kidney disease: Secondary | ICD-10-CM | POA: Diagnosis not present

## 2019-05-07 DIAGNOSIS — R112 Nausea with vomiting, unspecified: Secondary | ICD-10-CM | POA: Diagnosis not present

## 2019-05-07 DIAGNOSIS — Z9049 Acquired absence of other specified parts of digestive tract: Secondary | ICD-10-CM | POA: Diagnosis not present

## 2019-05-07 DIAGNOSIS — D649 Anemia, unspecified: Secondary | ICD-10-CM | POA: Insufficient documentation

## 2019-05-07 DIAGNOSIS — K219 Gastro-esophageal reflux disease without esophagitis: Secondary | ICD-10-CM | POA: Insufficient documentation

## 2019-05-07 DIAGNOSIS — N183 Chronic kidney disease, stage 3 unspecified: Secondary | ICD-10-CM | POA: Insufficient documentation

## 2019-05-07 DIAGNOSIS — K823 Fistula of gallbladder: Secondary | ICD-10-CM

## 2019-05-07 LAB — BASIC METABOLIC PANEL
Anion gap: 11 (ref 5–15)
BUN: 19 mg/dL (ref 8–23)
CO2: 26 mmol/L (ref 22–32)
Calcium: 7.8 mg/dL — ABNORMAL LOW (ref 8.9–10.3)
Chloride: 104 mmol/L (ref 98–111)
Creatinine, Ser: 1.69 mg/dL — ABNORMAL HIGH (ref 0.61–1.24)
GFR calc Af Amer: 44 mL/min — ABNORMAL LOW (ref >60)
GFR calc non Af Amer: 38 mL/min — ABNORMAL LOW (ref >60)
Glucose, Bld: 29 mg/dL — CL (ref 70–99)
Potassium: 3.9 mmol/L (ref 3.5–5.1)
Sodium: 141 mmol/L (ref 135–145)

## 2019-05-07 LAB — CBC
HCT: 28.3 % — ABNORMAL LOW (ref 39.0–52.0)
Hemoglobin: 8.7 g/dL — ABNORMAL LOW (ref 13.0–17.0)
MCH: 29.1 pg (ref 26.0–34.0)
MCHC: 30.7 g/dL (ref 30.0–36.0)
MCV: 94.6 fL (ref 80.0–100.0)
Platelets: 649 10*3/uL — ABNORMAL HIGH (ref 150–400)
RBC: 2.99 MIL/uL — ABNORMAL LOW (ref 4.22–5.81)
RDW: 15 % (ref 11.5–15.5)
WBC: 13.5 10*3/uL — ABNORMAL HIGH (ref 4.0–10.5)
nRBC: 0 % (ref 0.0–0.2)

## 2019-05-07 NOTE — Progress Notes (Signed)
Provider:  Veleta Miners, MD Location:  [place] Room Number: 156 D Place of Service:  SNF (31)  PCP: [name], MD Patient Care Team: [name], MD as PCP - General (Internal Medicine) Harl Bowie Alphonse Guild, MD as PCP - Cardiology (Cardiology)  Extended Emergency Contact Information Primary Emergency Contact: [facility] Address: [address]          [place], [place] [address] [place] Phone: [phone] Work Phone: [phone] Relation: Sister Secondary Emergency Contact: [place] Mobile Phone: [phone] Relation: Daughter  Code Status: Full Code Goals of Care: Advanced Directive information Advanced Directives 05/07/2019  Does Patient Have a Medical Advance Directive? No  Would patient like information on creating a medical advance directive? No - Patient declined  Pre-existing out of facility DNR order (yellow form or pink MOST form) -      Chief Complaint  Patient presents with  . New Admit To SNF    Admission    HPI: Patient is a 78 y.o. male seen today for admission to SNF for therapy He was in the hospital 5/27 to 6/11 rectal bleeding And Chole colo-fistula Patient has a history of hypertension, hyperlipidemia, diabetes type 2-3 previous history of cholecystostomy, history of diverticulosis He came to the ED due to nausea abdominal discomfort and painless rectal bleeding for few days.  His colonoscopy it was noticed to have inflamed area and hepatic flexure.  He underwent CT scan which revealed fistula between the gallbladder and hepatic flexure of colon. He underwent open cholecystectomy and partial colectomy on 6/1 Patient had a rectal bleeding postop needing 4 units of blood.  The bleeding was thought to be and anastomotic bleed  Patient diet was advanced and he was discharged to SNF for therapy Patient continues to have nausea in the facility and is not eating  well.  He also continues to complain of weakness in both his legs Patient denies any abdominal pain and that his bowels are moving well.  He did have one episode of blood in his stools yesterday but mostly his stools have been normal While eating lunch patient had episode of vomiting      Past Medical History:  Diagnosis Date  . CKD (chronic kidney disease), stage III ([place])   . Diabetes mellitus   . DVT (deep venous thrombosis) ([place]) 10/14/2016  . Extremity edema 09/2016  . Hyperlipemia   . Hypertension    Past Surgical History:  Procedure Laterality Date  . CHOLECYSTECTOMY N/A 04/23/2019   Procedure: CHOLECYSTECTOMY;  Surgeon: Aviva Signs, MD;  Location: [facility];  Service: General;  Laterality: N/A;  . COLONOSCOPY  02/2010   Dr. [name]: diverticuolosis, multiple polyps removed, cecal adenoma.   . COLONOSCOPY WITH PROPOFOL N/A 04/19/2019   Procedure: COLONOSCOPY WITH PROPOFOL;  Surgeon: [name], MD;  [facility];  Service: Endoscopy;  Laterality: N/A;  . IR GENERIC HISTORICAL  10/19/2016   IR PERC CHOLECYSTOSTOMY 10/19/2016 [name], MD [facility]  . IR GENERIC HISTORICAL  10/25/2016   IR CATHETER TUBE CHANGE 10/25/2016 [name], MD [facility]  . IR GENERIC HISTORICAL  11/30/2016   IR RADIOLOGIST EVAL & MGMT 11/30/2016 [facility] INTER[facility]  . PARTIAL COLECTOMY N/A 04/23/2019   Procedure: PARTIAL COLECTOMY;  Surgeon: Aviva Signs, MD;  Location: [facility];  Service: General;  Laterality: N/A;  . POLYPECTOMY  04/19/2019   Procedure: POLYPECTOMY;  Surgeon: [name], MD;  [facility];  Service: Endoscopy;;  polyp at recto sigmoid    reports that he has never smoked. He has never used smokeless tobacco. He reports that he does not drink alcohol or use drugs. Social History   Socioeconomic History  . Marital status: Widowed    Spouse name: Not on file  . Number of children: Not on file  . Years of education: Not on file  . Highest  education level: Not on file  Occupational History  . Not on file  Social Needs  . Financial resource strain: Not on file  . Food insecurity    Worry: Not on file    Inability: Not on file  . Transportation needs    Medical: Not on file    Non-medical: Not on file  Tobacco Use  . Smoking status: Never Smoker  . Smokeless tobacco: Never Used  Substance and Sexual Activity  . Alcohol use: No  . Drug use: No  . Sexual activity: Not on file  Lifestyle  . Physical activity    Days per week: Not on file    Minutes per session: Not on file  . Stress: Not on file  Relationships  . Social Herbalist on phone: Not on file    Gets together: Not on file    Attends religious service: Not on file    Active member of club or organization: Not on file    Attends meetings of clubs or organizations: Not on file    Relationship status: Not on file  . Intimate partner violence    Fear of current or ex partner: Not on file    Emotionally abused: Not on file    Physically abused: Not on file    Forced sexual activity: Not on file  Other Topics Concern  . Not on file  Social History Narrative  . Not on file    Functional Status Survey:    Family History  Problem Relation Age of Onset  . Heart attack Other   . Tuberculosis Other   . Tuberculosis Other   . Diabetes type II Father   . Hypertension Father   . Heart attack Maternal Aunt     Health Maintenance  Topic Date Due  . HEMOGLOBIN A1C  06/03/2019 (Originally 04/13/2017)  . OPHTHALMOLOGY EXAM  06/03/2019 (Originally 09/15/1951)  . PNA vac Low Risk Adult (1 of 2 - PCV13) 06/03/2019 (Originally 09/15/2004)  . FOOT EXAM  06/04/2019 (Originally 09/15/1951)  . TETANUS/TDAP  06/04/2019 (Originally 09/15/1958)  . URINE MICROALBUMIN  06/06/2019 (Originally 09/15/1951)  . INFLUENZA VACCINE  06/23/2019    No Known Allergies  Outpatient Encounter Medications as of 05/07/2019  Medication Sig  . acetaminophen (TYLENOL) 325  MG tablet Take 2 tablets (650 mg total) by mouth every 6 (six) hours as needed for mild pain, fever or headache (or Fever >/= 101).  Marland Kitchen aspirin EC 81 MG tablet Take 1 tablet (81 mg total) by mouth daily with breakfast.  . atorvastatin (LIPITOR) 40 MG tablet Take 40 mg by mouth daily.  . furosemide (LASIX) 20 MG tablet Take 1 tablet (20 mg total) by mouth daily with breakfast.  . glipiZIDE (GLUCOTROL) 10 MG tablet Take 10 mg by mouth 2 (two) times daily before a meal.   . hydrALAZINE (APRESOLINE) 100 MG tablet Take 0.5 tablets (50 mg total) by mouth 3 (three) times daily.  Marland Kitchen HYDROcodone-acetaminophen (NORCO/VICODIN) 5-325 MG tablet Take 1 tablet by mouth every 4 (four) hours as  needed for moderate pain.  Marland Kitchen lisinopril (ZESTRIL) 40 MG tablet Take 0.5 tablets (20 mg total) by mouth daily.  . metFORMIN (GLUCOPHAGE) 500 MG tablet Take 500 mg by mouth 2 (two) times a day.  . NON FORMULARY Diet Type:  NAS, Consistent Carbohydrate  . Nutritional Supplements (ENSURE ENLIVE PO) Take 1 Bottle by mouth daily.  . ondansetron (ZOFRAN-ODT) 4 MG disintegrating tablet Take 1 tablet (4 mg total) by mouth every 6 (six) hours as needed for nausea.  . pantoprazole (PROTONIX) 40 MG tablet Take 1 tablet (40 mg total) by mouth daily.  Marland Kitchen spironolactone (ALDACTONE) 25 MG tablet Take 25 mg by mouth daily.  . verapamil (CALAN-SR) 120 MG CR tablet Take 1 tablet (120 mg total) by mouth daily.   No facility-administered encounter medications on file as of 05/07/2019.     Review of Systems  Constitutional: Positive for activity change.  HENT: Negative.   Respiratory: Negative.   Cardiovascular: Positive for leg swelling.  Gastrointestinal: Positive for abdominal distention, nausea and vomiting.  Genitourinary: Negative.   Musculoskeletal: Negative.   Skin: Negative.   Neurological: Positive for weakness.  Psychiatric/Behavioral: Negative.     Vitals:   05/07/19 1012  BP: (!) 110/57  Pulse: 75  Resp: 20  Temp: 98.1  F (36.7 C)  Weight: 221 lb (100.2 kg)  Height: 5\' 6"  (1.676 m)   Body mass index is 35.67 kg/m. Physical Exam Vitals signs reviewed.  Constitutional:      Appearance: Normal appearance.  HENT:     Head: Normocephalic.     Nose: Nose normal.     Mouth/Throat:     Mouth: Mucous membranes are moist.     Pharynx: Oropharynx is clear.  Eyes:     Pupils: Pupils are equal, round, and reactive to light.  Neck:     Musculoskeletal: Neck supple.  Cardiovascular:     Rate and Rhythm: Normal rate and regular rhythm.     Pulses: Normal pulses.     Heart sounds: Normal heart sounds.  Pulmonary:     Effort: Pulmonary effort is normal. No respiratory distress.     Breath sounds: Normal breath sounds. No wheezing or rales.  Abdominal:     General: Abdomen is flat. There is distension.     Palpations: Abdomen is soft.     Tenderness: There is no abdominal tenderness.  Musculoskeletal:        General: Swelling present.  Skin:    General: Skin is warm and dry.  Neurological:     General: No focal deficit present.     Mental Status: He is alert and oriented to person, place, and time.     Comments: Had Bilateral LE Weakness  Psychiatric:        Mood and Affect: Mood normal.        Thought Content: Thought content normal.        Judgment: Judgment normal.     Labs reviewed: Basic Metabolic Panel: Recent Labs    04/24/19 0435 04/25/19 0414  04/28/19 0757  04/30/19 0539 05/01/19 0606 05/02/19 0450  NA 136 137   < > 138   < > 140 141 143  K 5.4* 4.5   < > 3.9   < > 3.5 3.4* 3.7  CL 106 108   < > 105   < > 109 110 110  CO2 20* 22   < > 23   < > 25 24 24   GLUCOSE 227* 138*   < >  185*   < > 144* 168* 142*  BUN 19 17   < > 27*   < > 23 17 14   CREATININE 1.48* 1.30*   < > 2.01*   < > 1.53* 1.32* 1.22  CALCIUM 7.4* 7.1*   < > 7.5*   < > 7.4* 7.4* 7.6*  MG 1.5* 1.7   < > 2.2  --  2.4  --  2.0  PHOS 3.4 2.5  --   --   --   --   --   --    < > = values in this interval not  displayed.   Liver Function Tests: Recent Labs    04/25/19 0414 04/26/19 0558 05/01/19 0606  AST 32 25 42*  ALT 36 26 48*  ALKPHOS 73 83 68  BILITOT 0.6 0.6 0.3  PROT 5.2* 5.4* 5.4*  ALBUMIN 2.5* 2.4* 1.9*   No results for input(s): LIPASE, AMYLASE in the last 8760 hours. No results for input(s): AMMONIA in the last 8760 hours. CBC: Recent Labs    04/18/19 1545  04/29/19 0702  05/02/19 2316 05/03/19 0447 05/07/19 0930  WBC 13.5*   < > 11.3*   < > 13.6* 13.0* 13.5*  NEUTROABS 8.7*  --  8.6*  --   --   --   --   HGB 11.1*   < > 7.3*   < > 8.5* 8.8* 8.7*  HCT 36.2*   < > 23.3*   < > 26.4* 27.7* 28.3*  MCV 96.5   < > 89.6   < > 92.0 92.0 94.6  PLT 288   < > 264   < > 354 365 649*   < > = values in this interval not displayed.   Cardiac Enzymes: No results for input(s): CKTOTAL, CKMB, CKMBINDEX, TROPONINI in the last 8760 hours. BNP: Invalid input(s): POCBNP Lab Results  Component Value Date   HGBA1C 8.4 (H) 10/14/2016   Lab Results  Component Value Date   TSH 1.095 10/13/2016   Lab Results  Component Value Date   VITAMINB12 861 10/13/2016   Lab Results  Component Value Date   FOLATE 15.4 10/13/2016   Lab Results  Component Value Date   IRON 35 (L) 10/13/2016   TIBC 186 (L) 10/13/2016   FERRITIN 1,065 (H) 10/13/2016    Imaging and Procedures obtained prior to SNF admission: No results found.  Assessment/Plan  Nausea and Vomiting Xray of abdomen  It showed /Ileus with no Obstruction Will continue Clear Liquids for now If Vomiting then send to ED Check BMP and Magnesium  On Zofran PRN Will keep him on Clear Liquis for now  Choloenteric fistula -  S/P S/P Open cholecystectomy and partial colectomy Staples Removed today Follow up with Surgeon  Controlled type 2 diabetes mellitus   Plan:  Hypoglycemia BS of 29 today He is as not earing Discontinue Glipizide for now Continue Metformin CBG Q6 for 24 hours  Dyslipidemia- Plan:  Continue On  statin  CKD stage 3 due to type 2 diabetes mellitus ([place]) - Plan: Creat Improved with Hydration  Bilateral lower extremity edema - Plan:  Continue On Lasix and Aldactone'Follow up BMP  Hypertension associated with stage 3 chronic kidney disease due to type 2 diabetes mellitus ([place]) - Plan:  On Hydralazine, Verapamil and lisinopril  Anemia S/P PRBC transfusion 5 units Cannot use Iron right now with his Nausea and Ileus    Family/ staff Communication:   Labs/tests ordered: BMP and Mag level  tomorrow Total time spent in this patient care encounter was  45_  minutes; greater than 50% of the visit spent counseling patient and staff, reviewing records , Labs and coordinating care for problems addressed at this encounter.

## 2019-05-08 ENCOUNTER — Other Ambulatory Visit (HOSPITAL_COMMUNITY)
Admission: RE | Admit: 2019-05-08 | Discharge: 2019-05-08 | Disposition: A | Discharge status: Home / Self Care | Payer: Medicare HMO | Source: Ambulatory Visit | Attending: Internal Medicine | Admitting: Internal Medicine

## 2019-05-08 ENCOUNTER — Non-Acute Institutional Stay (SKILLED_NURSING_FACILITY): Payer: Medicare HMO | Admitting: Adult Health

## 2019-05-08 ENCOUNTER — Encounter: Payer: Self-pay | Admitting: Adult Health

## 2019-05-08 DIAGNOSIS — K823 Fistula of gallbladder: Secondary | ICD-10-CM

## 2019-05-08 DIAGNOSIS — I10 Essential (primary) hypertension: Secondary | ICD-10-CM | POA: Insufficient documentation

## 2019-05-08 DIAGNOSIS — K8010 Calculus of gallbladder with chronic cholecystitis without obstruction: Secondary | ICD-10-CM | POA: Diagnosis not present

## 2019-05-08 LAB — BASIC METABOLIC PANEL
Anion gap: 10 (ref 5–15)
BUN: 20 mg/dL (ref 8–23)
CO2: 26 mmol/L (ref 22–32)
Calcium: 7.7 mg/dL — ABNORMAL LOW (ref 8.9–10.3)
Chloride: 104 mmol/L (ref 98–111)
Creatinine, Ser: 1.58 mg/dL — ABNORMAL HIGH (ref 0.61–1.24)
GFR calc Af Amer: 48 mL/min — ABNORMAL LOW (ref >60)
GFR calc non Af Amer: 41 mL/min — ABNORMAL LOW (ref >60)
Glucose, Bld: 75 mg/dL (ref 70–99)
Potassium: 4.1 mmol/L (ref 3.5–5.1)
Sodium: 140 mmol/L (ref 135–145)

## 2019-05-08 LAB — MAGNESIUM: Magnesium: 1.8 mg/dL (ref 1.7–2.4)

## 2019-05-08 NOTE — Progress Notes (Signed)
Location:    [place] Room Number: 156 D Place of Service:  SNF (31)   CODE STATUS: Full Code  No Known Allergies  Chief Complaint  Patient presents with  . Acute Visit    Pain Management    HPI:  On 04-23-19 he had an open cholecystectomy and partial colectomy due to a chole-colo fistula. He had vicodin 5/325 mg ordered as needed for pain management; which he has not used. He had a low cbg on 05-08-19 was given "too may sweet foods and did have vomiting present" his kub demonstrated possible ileus. He is presently on clear liquids for 24 hours. Staff report that he does have a small amount of old blood in his stool. He is presently on asa 81 mg daily. He will need to follow up with GI sooner rather than later. He denies any further vomiting; no diarrhea; no abdominal pain. There are no reports of fevers present.     Past Medical History:  Diagnosis Date  . CKD (chronic kidney disease), stage III ([place])   . Diabetes mellitus   . DVT (deep venous thrombosis) ([place]) 10/14/2016  . Extremity edema 09/2016  . Hyperlipemia   . Hypertension     Past Surgical History:  Procedure Laterality Date  . CHOLECYSTECTOMY N/A 04/23/2019   Procedure: CHOLECYSTECTOMY;  Surgeon: Aviva Signs, MD;  Location: [facility];  Service: General;  Laterality: N/A;  . COLONOSCOPY  02/2010   Dr. [name]: diverticuolosis, multiple polyps removed, cecal adenoma.   . COLONOSCOPY WITH PROPOFOL N/A 04/19/2019   Procedure: COLONOSCOPY WITH PROPOFOL;  Surgeon: [name], MD;  Location: [facility];  Service: Endoscopy;  Laterality: N/A;  . IR GENERIC HISTORICAL  10/19/2016   IR PERC CHOLECYSTOSTOMY 10/19/2016 [name], MD [facility]  . IR GENERIC HISTORICAL  10/25/2016   IR CATHETER TUBE CHANGE 10/25/2016 [name], MD [facility]  . IR GENERIC HISTORICAL  11/30/2016   IR RADIOLOGIST EVAL & MGMT 11/30/2016 [facility] INTER[facility]  . PARTIAL COLECTOMY N/A 04/23/2019   Procedure:  PARTIAL COLECTOMY;  Surgeon: Aviva Signs, MD;  Location: [facility];  Service: General;  Laterality: N/A;  . POLYPECTOMY  04/19/2019   Procedure: POLYPECTOMY;  Surgeon: [name], MD;  Location: [facility];  Service: Endoscopy;;  polyp at recto sigmoid    Social History   Socioeconomic History  . Marital status: Widowed    Spouse name: Not on file  . Number of children: Not on file  . Years of education: Not on file  . Highest education level: Not on file  Occupational History  . Not on file  Social Needs  . Financial resource strain: Not on file  . Food insecurity    Worry: Not on file    Inability: Not on file  . Transportation needs    Medical: Not on file    Non-medical: Not on file  Tobacco Use  . Smoking status: Never Smoker  . Smokeless tobacco: Never Used  Substance and Sexual Activity  . Alcohol use: No  . Drug use: No  . Sexual activity: Not on file  Lifestyle  . Physical activity    Days per week: Not on file    Minutes per session: Not on file  . Stress: Not on file  Relationships  . Social Herbalist on phone: Not on file    Gets together: Not on file    Attends religious service: Not  on file    Active member of club or organization: Not on file    Attends meetings of clubs or organizations: Not on file    Relationship status: Not on file  . Intimate partner violence    Fear of current or ex partner: Not on file    Emotionally abused: Not on file    Physically abused: Not on file    Forced sexual activity: Not on file  Other Topics Concern  . Not on file  Social History Narrative  . Not on file   Family History  Problem Relation Age of Onset  . Heart attack Other   . Tuberculosis Other   . Tuberculosis Other   . Diabetes type II Father   . Hypertension Father   . Heart attack Maternal Aunt       VITAL SIGNS BP 137/61   Pulse 74   Temp 97.8 F (36.6 C)   Resp 20   Ht 5\' 6"  (1.676 m)   Wt 221 lb (100.2 kg)   BMI 35.67  kg/m   Outpatient Encounter Medications as of 05/08/2019  Medication Sig  . acetaminophen (TYLENOL) 325 MG tablet Take 2 tablets (650 mg total) by mouth every 6 (six) hours as needed for mild pain, fever or headache (or Fever >/= 101).  Marland Kitchen aspirin EC 81 MG tablet Take 1 tablet (81 mg total) by mouth daily with breakfast.  . atorvastatin (LIPITOR) 40 MG tablet Take 40 mg by mouth daily.  . furosemide (LASIX) 20 MG tablet Take 1 tablet (20 mg total) by mouth daily with breakfast.  . hydrALAZINE (APRESOLINE) 100 MG tablet Take 0.5 tablets (50 mg total) by mouth 3 (three) times daily.  Marland Kitchen HYDROcodone-acetaminophen (NORCO/VICODIN) 5-325 MG tablet Take 1 tablet by mouth every 4 (four) hours as needed for moderate pain.  Marland Kitchen lisinopril (ZESTRIL) 40 MG tablet Take 0.5 tablets (20 mg total) by mouth daily.  . metFORMIN (GLUCOPHAGE) 500 MG tablet Take 500 mg by mouth 2 (two) times a day.  . NON FORMULARY Diet Type:  NAS, Consistent Carbohydrate, bland diet  . Nutritional Supplements (ENSURE ENLIVE PO) Take 1 Bottle by mouth daily.  . ondansetron (ZOFRAN-ODT) 4 MG disintegrating tablet Take 1 tablet (4 mg total) by mouth every 6 (six) hours as needed for nausea.  . pantoprazole (PROTONIX) 40 MG tablet Take 1 tablet (40 mg total) by mouth daily.  Marland Kitchen spironolactone (ALDACTONE) 25 MG tablet Take 25 mg by mouth daily.  . verapamil (CALAN-SR) 120 MG CR tablet Take 1 tablet (120 mg total) by mouth daily.  . [DISCONTINUED] glipiZIDE (GLUCOTROL) 10 MG tablet Take 10 mg by mouth 2 (two) times daily before a meal.    No facility-administered encounter medications on file as of 05/08/2019.      SIGNIFICANT DIAGNOSTIC EXAMS  PREVIOUS;   04-19-19: ct of abdomen and pelvis:  1. Findings highly suspicious for a fistula track between the gallbladder lumen and hepatic flexure of the colon. There are multiple pockets of gas and debris within the gallbladder lumen. There is no definite CT evidence of acute cholecystitis.  2. Moderate dilatation of the common bile duct. Correlation with laboratory studies is recommended to help exclude an obstructing process. 3. Significantly distended urinary bladder. 4. No specific abnormality identified to explain the patient's painless hematochezia. 5. Cardiomegaly.  Bilateral gynecomastia is noted.  04-19-19: colonoscopy: One 4 mm polyp at the recto-sigmoid colon, removed with a cold snare. Resected andvretrieved. Colonic lipoma. Markedly abnormal area of  colon at the hepatic flexure of uncertain etiology. (Query fistula,vatypical presentation for infiltrating tumor or possibly related to previously placedvcholecystostomy tube). This area is suspicious for site of bleeding.  04-28-19: renal ultrasound: Normal size kidneys without hydronephrosis.  Bilateral renal cysts.  04-29-19 left knee x-ray: No acute findings. Small joint effusion.  Mild to moderate osteoarthritis.  04-30-19: chest x-ray: Mild opacity in the right base may represent a small effusion with atelectasis versus infiltrate. Recommend follow-up to resolution.  TODAY:   05-07-19: kub: Nonobstructive bowel gas pattern. Mild distention of the stomach and colon may be due to ileus.   LABS REVIEWED PREVIOUS;   04-18-19: wbc 13.5; hgb 11.1; hct 36.2; mcv 96.5 plt 288; glucose 191; bun 21; creat 1.63; k+ 4.4; an++ 142; ca 8.6; liver normal albumin 3.6 04-20-19: wbc 6.4; hgb 9.0; hct 28.6; mcv 93.5; plt 219; glucose 147; bun 12; creat 1.15; k+ 4.2; na++ 138; ca 8.2; liver normal albumin 3.3 04-22-19: wbc 7.2; hgb 9.4; hct 29.6 plt 293 04-24-19; wbc 12.2; hgb 8.9; hct  28.7 mcv 95.7; plt 240; glucose 227; bun 19; creat 1.48; k+ 5.4; na++ 136; ca 7.4; liver normal albumin 2.8  04-26-19; wbc 12.8; hgb 7.1; hct 23.0; mcv 94.3; plt 218; glucose 152; bun 14; creat 1.24; k+ 3.9; na++ 137; ca 7.2; liver normal albumin 2.4 mag 1.9 04-28-19; wbc 12.1; hgb 7.8; hct 24.3 mcv 90.3; plt 254; glucose 185; bun 27; creat 2.01; k+ 3.9; na++ 138  ca 7.5 mag 2.2 04-29-19; sed rate 137 05-01-19; wbc 12.3; hgb 9.5; hct 30.0; mcv 90.9; plt 327; glucose 168; bun 17; creat 1.32; k+ 3.4; na++ 141; ca 7.4; liver normal albumin 1.9;  05-03-19: wbc 13.0; hgb 8.8; hct 27.7; mcv 92.0 plt 365   TODAY:   05-08-19: glucose 75; bun 20; creat 1.58; k+ 4.1; na++ 140; ca 7.7; mag 1.8     Review of Systems  Constitutional: Negative for malaise/fatigue.  Respiratory: Negative for cough and shortness of breath.   Cardiovascular: Negative for chest pain, palpitations and leg swelling.  Gastrointestinal: Negative for abdominal pain, constipation and heartburn.  Musculoskeletal: Negative for back pain, joint pain and myalgias.  Skin: Negative.   Neurological: Negative for dizziness.  Psychiatric/Behavioral: The patient is not nervous/anxious.      Physical Exam Constitutional:      General: He is not in acute distress.    Appearance: He is obese. He is not diaphoretic.  Neck:     Musculoskeletal: Neck supple.     Thyroid: No thyromegaly.  Cardiovascular:     Rate and Rhythm: Normal rate. Rhythm irregular.     Pulses: Normal pulses.     Heart sounds: Normal heart sounds.  Pulmonary:     Effort: Pulmonary effort is normal. No respiratory distress.     Breath sounds: Normal breath sounds.  Abdominal:     General: Bowel sounds are normal. There is distension.     Palpations: Abdomen is soft.     Tenderness: There is no abdominal tenderness.  Musculoskeletal:     Right lower leg: No edema.     Left lower leg: No edema.     Comments: Left knee swelling present: history of aspiration    Lymphadenopathy:     Cervical: No cervical adenopathy.  Skin:    General: Skin is warm and dry.  Neurological:     Mental Status: He is alert and oriented to person, place, and time.  Psychiatric:  Mood and Affect: Mood normal.       ASSESSMENT/ PLAN:  TODAY:   1.. 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 We will attempt to wean resident as apropriate   Ok Edwards NP [facility] Adult Medicine  Contact [phone] Monday through Friday 8am- 5pm  After hours call [phone]

## 2019-05-14 ENCOUNTER — Encounter (HOSPITAL_COMMUNITY)
Admission: RE | Admit: 2019-05-14 | Discharge: 2019-05-14 | Disposition: A | Discharge status: Home / Self Care | Payer: Medicare HMO | Source: Ambulatory Visit | Attending: Internal Medicine | Admitting: Internal Medicine

## 2019-05-14 ENCOUNTER — Non-Acute Institutional Stay (SKILLED_NURSING_FACILITY): Payer: Medicare HMO | Admitting: Adult Health

## 2019-05-14 ENCOUNTER — Encounter: Payer: Self-pay | Admitting: Adult Health

## 2019-05-14 DIAGNOSIS — N183 Type 2 diabetes mellitus with diabetic chronic kidney disease: Secondary | ICD-10-CM

## 2019-05-14 DIAGNOSIS — K823 Fistula of gallbladder: Secondary | ICD-10-CM | POA: Diagnosis not present

## 2019-05-14 DIAGNOSIS — K8010 Calculus of gallbladder with chronic cholecystitis without obstruction: Secondary | ICD-10-CM

## 2019-05-14 DIAGNOSIS — E1122 Type 2 diabetes mellitus with diabetic chronic kidney disease: Secondary | ICD-10-CM

## 2019-05-14 DIAGNOSIS — R748 Abnormal levels of other serum enzymes: Secondary | ICD-10-CM

## 2019-05-14 LAB — COMPREHENSIVE METABOLIC PANEL
ALT: 159 U/L — ABNORMAL HIGH (ref 0–44)
AST: 107 U/L — ABNORMAL HIGH (ref 15–41)
Albumin: 2.2 g/dL — ABNORMAL LOW (ref 3.5–5.0)
Alkaline Phosphatase: 101 U/L (ref 38–126)
Anion gap: 13 (ref 5–15)
BUN: 26 mg/dL — ABNORMAL HIGH (ref 8–23)
CO2: 25 mmol/L (ref 22–32)
Calcium: 8 mg/dL — ABNORMAL LOW (ref 8.9–10.3)
Chloride: 100 mmol/L (ref 98–111)
Creatinine, Ser: 1.41 mg/dL — ABNORMAL HIGH (ref 0.61–1.24)
GFR calc Af Amer: 55 mL/min — ABNORMAL LOW (ref >60)
GFR calc non Af Amer: 47 mL/min — ABNORMAL LOW (ref >60)
Glucose, Bld: 195 mg/dL — ABNORMAL HIGH (ref 70–99)
Potassium: 4.6 mmol/L (ref 3.5–5.1)
Sodium: 138 mmol/L (ref 135–145)
Total Bilirubin: 0.2 mg/dL — ABNORMAL LOW (ref 0.3–1.2)
Total Protein: 6.5 g/dL (ref 6.5–8.1)

## 2019-05-14 LAB — CBC
HCT: 28.2 % — ABNORMAL LOW (ref 39.0–52.0)
Hemoglobin: 8.7 g/dL — ABNORMAL LOW (ref 13.0–17.0)
MCH: 28.5 pg (ref 26.0–34.0)
MCHC: 30.9 g/dL (ref 30.0–36.0)
MCV: 92.5 fL (ref 80.0–100.0)
Platelets: 749 10*3/uL — ABNORMAL HIGH (ref 150–400)
RBC: 3.05 MIL/uL — ABNORMAL LOW (ref 4.22–5.81)
RDW: 14.7 % (ref 11.5–15.5)
WBC: 14.4 10*3/uL — ABNORMAL HIGH (ref 4.0–10.5)
nRBC: 0.1 % (ref 0.0–0.2)

## 2019-05-14 NOTE — Progress Notes (Signed)
Location:   [place] Room Number: 156 D Place of Service:  SNF (31)   CODE STATUS: Full Code  No Known Allergies  Chief Complaint  Patient presents with  . 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     HPI:  He is a 78 year old short term rehab patient being seen for the management of his chronic illnesses; choloenteric fistula; diabetes renal failure. His liver enzymes are elevated. He had recently been placed on clear liquids for 24 hours due to possible ileus. He states that he has no appetite. He has diarrheal stools after each meal. He denies any fevers; no nausea; no vomiting.    Past Medical History:  Diagnosis Date  . CKD (chronic kidney disease), stage III ([place])   . Diabetes mellitus   . DVT (deep venous thrombosis) ([place]) 10/14/2016  . Extremity edema 09/2016  . Hyperlipemia   . Hypertension     Past Surgical History:  Procedure Laterality Date  . CHOLECYSTECTOMY N/A 04/23/2019   Procedure: CHOLECYSTECTOMY;  Surgeon: Aviva Signs, MD;  Location: [facility];  Service: General;  Laterality: N/A;  . COLONOSCOPY  02/2010   Dr. [name]: diverticuolosis, multiple polyps removed, cecal adenoma.   . COLONOSCOPY WITH PROPOFOL N/A 04/19/2019   Procedure: COLONOSCOPY WITH PROPOFOL;  Surgeon: [name], MD;  Location[facility];  Service: Endoscopy;  Laterality: N/A;  . IR GENERIC HISTORICAL  10/19/2016   IR PERC CHOLECYSTOSTOMY 10/19/2016 [name], MD [facility]  . IR GENERIC HISTORICAL  10/25/2016   IR CATHETER TUBE CHANGE 10/25/2016 [name], MD [facility]  . IR GENERIC HISTORICAL  11/30/2016   IR  RADIOLOGIST EVAL & MGMT 11/30/2016 [facility] INTERV RAD  . PARTIAL COLECTOMY N/A 04/23/2019   Procedure: PARTIAL COLECTOMY;  Surgeon: Aviva Signs, MD;  Location: [facility];  Service: General;  Laterality: N/A;  . POLYPECTOMY  04/19/2019   Procedure: POLYPECTOMY;  Surgeon: [name], MD;  Location: [facility];  Service: Endoscopy;;  polyp at recto sigmoid    Social History   Socioeconomic History  . Marital status: Widowed    Spouse name: Not on file  . Number of children: Not on file  . Years of education: Not on file  . Highest education level: Not on file  Occupational History  . Not on file  Social Needs  . Financial resource strain: Not on file  . Food insecurity    Worry: Not on file    Inability: Not on file  . Transportation needs    Medical: Not on file    Non-medical: Not on file  Tobacco Use  . Smoking status: Never Smoker  . Smokeless tobacco: Never Used  Substance and Sexual Activity  . Alcohol use: No  . Drug use: No  . Sexual activity: Not on file  Lifestyle  . Physical activity    Days per week: Not on file    Minutes per session: Not on file  . Stress: Not on file  Relationships  . Social Herbalist on phone: Not on file    Gets together: Not on file  Attends religious service: Not on file    Active member of club or organization: Not on file    Attends meetings of clubs or organizations: Not on file    Relationship status: Not on file  . Intimate partner violence    Fear of current or ex partner: Not on file    Emotionally abused: Not on file    Physically abused: Not on file    Forced sexual activity: Not on file  Other Topics Concern  . Not on file  Social History Narrative  . Not on file   Family History  Problem Relation Age of Onset  . Heart attack Other   . Tuberculosis Other   . Tuberculosis Other   . Diabetes type II Father   . Hypertension Father   . Heart attack Maternal Aunt       VITAL SIGNS BP 135/73    Pulse 88   Temp (!) 97.4 F (36.3 C)   Resp 20   Ht '5\' 6"'$  (1.676 m)   Wt 221 lb (100.2 kg)   BMI 35.67 kg/m   Outpatient Encounter Medications as of 05/14/2019  Medication Sig  . acetaminophen (TYLENOL) 325 MG tablet Take 2 tablets (650 mg total) by mouth every 6 (six) hours as needed for mild pain, fever or headache (or Fever >/= 101).  . Amino Acids-Protein Hydrolys (FEEDING SUPPLEMENT, PRO-STAT SUGAR FREE 64,) LIQD Take 30 mLs by mouth 2 (two) times daily between meals.  Marland Kitchen atorvastatin (LIPITOR) 40 MG tablet Take 40 mg by mouth daily.  . furosemide (LASIX) 20 MG tablet Take 1 tablet (20 mg total) by mouth daily with breakfast.  . hydrALAZINE (APRESOLINE) 100 MG tablet Take 0.5 tablets (50 mg total) by mouth 3 (three) times daily.  Marland Kitchen lisinopril (ZESTRIL) 40 MG tablet Take 0.5 tablets (20 mg total) by mouth daily.  . metFORMIN (GLUCOPHAGE) 500 MG tablet Take 500 mg by mouth 2 (two) times a day.  . NON FORMULARY Diet Type:  NAS, Consistent Carbohydrate, bland diet  . Nutritional Supplements (ENSURE CLEAR) LIQD Take 1 Bottle by mouth 2 (two) times daily between meals.  . Nutritional Supplements (ENSURE ENLIVE PO) Take 1 Bottle by mouth daily.  . ondansetron (ZOFRAN-ODT) 4 MG disintegrating tablet Take 1 tablet (4 mg total) by mouth every 6 (six) hours as needed for nausea.  . pantoprazole (PROTONIX) 40 MG tablet Take 1 tablet (40 mg total) by mouth daily.  Marland Kitchen spironolactone (ALDACTONE) 25 MG tablet Take 25 mg by mouth daily.  . verapamil (CALAN-SR) 120 MG CR tablet Take 1 tablet (120 mg total) by mouth daily.  . [DISCONTINUED] aspirin EC 81 MG tablet Take 1 tablet (81 mg total) by mouth daily with breakfast. (Patient not taking: Reported on 05/14/2019)  . [DISCONTINUED] HYDROcodone-acetaminophen (NORCO/VICODIN) 5-325 MG tablet Take 1 tablet by mouth every 4 (four) hours as needed for moderate pain. (Patient not taking: Reported on 05/14/2019)   No facility-administered encounter medications on  file as of 05/14/2019.      SIGNIFICANT DIAGNOSTIC EXAMS  PREVIOUS;   04-19-19: ct of abdomen and pelvis:  1. Findings highly suspicious for a fistula track between the gallbladder lumen and hepatic flexure of the colon. There are multiple pockets of gas and debris within the gallbladder lumen. There is no definite CT evidence of acute cholecystitis. 2. Moderate dilatation of the common bile duct. Correlation with laboratory studies is recommended to help exclude an obstructing process. 3. Significantly distended urinary bladder. 4. No specific  abnormality identified to explain the patient's painless hematochezia. 5. Cardiomegaly.  Bilateral gynecomastia is noted.  04-19-19: colonoscopy: One 4 mm polyp at the recto-sigmoid colon, removed with a cold snare. Resected andvretrieved. Colonic lipoma. Markedly abnormal area of colon at the hepatic flexure of uncertain etiology. (Query fistula,vatypical presentation for infiltrating tumor or possibly related to previously placedvcholecystostomy tube). This area is suspicious for site of bleeding.  04-28-19: renal ultrasound: Normal size kidneys without hydronephrosis.  Bilateral renal cysts.  04-29-19 left knee x-ray: No acute findings. Small joint effusion.  Mild to moderate osteoarthritis.  04-30-19: chest x-ray: Mild opacity in the right base may represent a small effusion with atelectasis versus infiltrate. Recommend follow-up to resolution.  05-07-19: kub: Nonobstructive bowel gas pattern. Mild distention of the stomach and colon may be due to ileus.  NO NEW EXAMS.    LABS REVIEWED PREVIOUS;   04-18-19: wbc 13.5; hgb 11.1; hct 36.2; mcv 96.5 plt 288; glucose 191; bun 21; creat 1.63; k+ 4.4; an++ 142; ca 8.6; liver normal albumin 3.6 04-20-19: wbc 6.4; hgb 9.0; hct 28.6; mcv 93.5; plt 219; glucose 147; bun 12; creat 1.15; k+ 4.2; na++ 138; ca 8.2; liver normal albumin 3.3 04-22-19: wbc 7.2; hgb 9.4; hct 29.6 plt 293 04-24-19; wbc 12.2; hgb 8.9;  hct  28.7 mcv 95.7; plt 240; glucose 227; bun 19; creat 1.48; k+ 5.4; na++ 136; ca 7.4; liver normal albumin 2.8  04-26-19; wbc 12.8; hgb 7.1; hct 23.0; mcv 94.3; plt 218; glucose 152; bun 14; creat 1.24; k+ 3.9; na++ 137; ca 7.2; liver normal albumin 2.4 mag 1.9 04-28-19; wbc 12.1; hgb 7.8; hct 24.3 mcv 90.3; plt 254; glucose 185; bun 27; creat 2.01; k+ 3.9; na++ 138 ca 7.5 mag 2.2 04-29-19; sed rate 137 05-01-19; wbc 12.3; hgb 9.5; hct 30.0; mcv 90.9; plt 327; glucose 168; bun 17; creat 1.32; k+ 3.4; na++ 141; ca 7.4; liver normal albumin 1.9;  05-03-19: wbc 13.0; hgb 8.8; hct 27.7; mcv 92.0 plt 365  05-08-19: glucose 75; bun 20; creat 1.58; k+ 4.1; na++ 140; ca 7.7; mag 1.8   TODAY;   05-14-19: wbc 14.4; hgb 8.7; ct 28.2; mcv 92.5 plt 749; glucose 195; bun 25; creat 1.41; k+ 4.6; na++ 138; ca 8.0; ast 107 alt 157; alk phos 101; total bili 0.2; albumin 2.2    Review of Systems  Constitutional: Positive for malaise/fatigue.  Respiratory: Negative for cough and shortness of breath.   Cardiovascular: Negative for chest pain, palpitations and leg swelling.  Gastrointestinal: Positive for blood in stool and diarrhea. Negative for abdominal pain and heartburn.       Poor appetite   Musculoskeletal: Negative for back pain, joint pain and myalgias.  Skin: Negative.   Neurological: Negative for dizziness.  Psychiatric/Behavioral: The patient is not nervous/anxious.     Physical Exam Constitutional:      General: He is not in acute distress.    Appearance: He is well-developed. He is obese. He is not diaphoretic.  Neck:     Musculoskeletal: Neck supple.     Thyroid: No thyromegaly.  Cardiovascular:     Rate and Rhythm: Normal rate. Rhythm irregular.     Pulses: Normal pulses.     Heart sounds: Normal heart sounds.  Pulmonary:     Effort: Pulmonary effort is normal. No respiratory distress.     Breath sounds: Normal breath sounds.  Abdominal:     General: Bowel sounds are normal. There is  distension.     Palpations: Abdomen  is soft.     Tenderness: There is no abdominal tenderness.  Musculoskeletal:     Right lower leg: No edema.     Left lower leg: No edema.     Comments: Left knee swelling present: history of aspiration     Lymphadenopathy:     Cervical: No cervical adenopathy.  Skin:    General: Skin is warm and dry.  Neurological:     Mental Status: He is alert and oriented to person, place, and time.  Psychiatric:        Mood and Affect: Mood normal.       ASSESSMENT/ PLAN:  TODAY:   1. 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   2. Controlled type 2 diabetes mellitus with stage 3 chronic renal disease without long term current use of insulin: is stable will continue metformin 500 mg twice daily. Will monitor his status. Is on ace and statin. His asa was stopped due to blood in stool.   3. CKD stage 3 due to type 2 diabetes mellitus: is stable bun 25; creat 1.41. will monitor   PREVIOUS   4.  Dyslipidemia associated with type 2 diabetes mellitus: is stable will continue lipitor 40 mg daily   5. Hypertension associated with stage 3 chronic kidney disease due to type 2 diabetes mellitus: is stable b/p 122/81 will continue lisnopril 20 mg daily apresoline 50 mg twice daily aldactone 25 mg daily and calan sr 120 mg daily   6. Bilateral lower extremity edema: is stable will continue lasix 20 mg daily   7. GERD without esophagitis: is stable will continue protonix 40 mg daily    Will get a right upper quad.    MD is aware of resident's narcotic use and is in agreement with current plan of care. We will attempt to wean resident as apropriate   Ok Edwards NP [facility]  Adult Medicine  Contact [phone] Monday through Friday 8am- 5pm  After hours call [phone]

## 2019-05-15 ENCOUNTER — Encounter (HOSPITAL_COMMUNITY): Payer: Self-pay

## 2019-05-15 ENCOUNTER — Telehealth: Payer: Self-pay | Admitting: Internal Medicine

## 2019-05-15 ENCOUNTER — Ambulatory Visit (HOSPITAL_COMMUNITY)
Admission: RE | Admit: 2019-05-15 | Discharge: 2019-05-15 | Disposition: A | Discharge status: Home / Self Care | Payer: Medicare HMO | Source: Ambulatory Visit | Attending: Adult Health | Admitting: Adult Health

## 2019-05-15 ENCOUNTER — Other Ambulatory Visit: Payer: Self-pay

## 2019-05-15 ENCOUNTER — Emergency Department (HOSPITAL_COMMUNITY): Payer: Medicare HMO

## 2019-05-15 ENCOUNTER — Inpatient Hospital Stay (HOSPITAL_COMMUNITY)
Admission: EM | Admit: 2019-05-15 | Discharge: 2019-05-21 | DRG: 862 | Disposition: A | Discharge status: Home Health | Payer: Medicare HMO | Attending: Student | Admitting: Student

## 2019-05-15 ENCOUNTER — Encounter: Payer: Self-pay | Admitting: Adult Health

## 2019-05-15 ENCOUNTER — Non-Acute Institutional Stay (SKILLED_NURSING_FACILITY): Payer: Medicare HMO | Admitting: Adult Health

## 2019-05-15 DIAGNOSIS — E1129 Type 2 diabetes mellitus with other diabetic kidney complication: Secondary | ICD-10-CM | POA: Diagnosis present

## 2019-05-15 DIAGNOSIS — Z7984 Long term (current) use of oral hypoglycemic drugs: Secondary | ICD-10-CM

## 2019-05-15 DIAGNOSIS — Z833 Family history of diabetes mellitus: Secondary | ICD-10-CM | POA: Diagnosis not present

## 2019-05-15 DIAGNOSIS — K828 Other specified diseases of gallbladder: Secondary | ICD-10-CM | POA: Diagnosis not present

## 2019-05-15 DIAGNOSIS — Y838 Other surgical procedures as the cause of abnormal reaction of the patient, or of later complication, without mention of misadventure at the time of the procedure: Secondary | ICD-10-CM | POA: Diagnosis present

## 2019-05-15 DIAGNOSIS — Z86010 Personal history of colonic polyps: Secondary | ICD-10-CM | POA: Diagnosis not present

## 2019-05-15 DIAGNOSIS — K823 Fistula of gallbladder: Secondary | ICD-10-CM | POA: Diagnosis not present

## 2019-05-15 DIAGNOSIS — I700 Atherosclerosis of aorta: Secondary | ICD-10-CM | POA: Diagnosis not present

## 2019-05-15 DIAGNOSIS — K8010 Calculus of gallbladder with chronic cholecystitis without obstruction: Secondary | ICD-10-CM

## 2019-05-15 DIAGNOSIS — Z7982 Long term (current) use of aspirin: Secondary | ICD-10-CM | POA: Diagnosis not present

## 2019-05-15 DIAGNOSIS — R748 Abnormal levels of other serum enzymes: Secondary | ICD-10-CM | POA: Insufficient documentation

## 2019-05-15 DIAGNOSIS — Z86718 Personal history of other venous thrombosis and embolism: Secondary | ICD-10-CM

## 2019-05-15 DIAGNOSIS — Z79899 Other long term (current) drug therapy: Secondary | ICD-10-CM

## 2019-05-15 DIAGNOSIS — R933 Abnormal findings on diagnostic imaging of other parts of digestive tract: Secondary | ICD-10-CM | POA: Diagnosis not present

## 2019-05-15 DIAGNOSIS — K6811 Postprocedural retroperitoneal abscess: Secondary | ICD-10-CM | POA: Diagnosis not present

## 2019-05-15 DIAGNOSIS — E1122 Type 2 diabetes mellitus with diabetic chronic kidney disease: Secondary | ICD-10-CM | POA: Diagnosis not present

## 2019-05-15 DIAGNOSIS — D500 Iron deficiency anemia secondary to blood loss (chronic): Secondary | ICD-10-CM | POA: Diagnosis not present

## 2019-05-15 DIAGNOSIS — Z8719 Personal history of other diseases of the digestive system: Secondary | ICD-10-CM | POA: Diagnosis not present

## 2019-05-15 DIAGNOSIS — Z8249 Family history of ischemic heart disease and other diseases of the circulatory system: Secondary | ICD-10-CM

## 2019-05-15 DIAGNOSIS — N183 Type 2 diabetes mellitus with diabetic chronic kidney disease: Secondary | ICD-10-CM

## 2019-05-15 DIAGNOSIS — K921 Melena: Secondary | ICD-10-CM | POA: Diagnosis present

## 2019-05-15 DIAGNOSIS — D649 Anemia, unspecified: Secondary | ICD-10-CM | POA: Diagnosis not present

## 2019-05-15 DIAGNOSIS — R739 Hyperglycemia, unspecified: Secondary | ICD-10-CM

## 2019-05-15 DIAGNOSIS — T8149XA Infection following a procedure, other surgical site, initial encounter: Secondary | ICD-10-CM | POA: Diagnosis not present

## 2019-05-15 DIAGNOSIS — N2889 Other specified disorders of kidney and ureter: Secondary | ICD-10-CM | POA: Diagnosis not present

## 2019-05-15 DIAGNOSIS — K529 Noninfective gastroenteritis and colitis, unspecified: Secondary | ICD-10-CM | POA: Diagnosis present

## 2019-05-15 DIAGNOSIS — K5900 Constipation, unspecified: Secondary | ICD-10-CM | POA: Diagnosis not present

## 2019-05-15 DIAGNOSIS — E782 Mixed hyperlipidemia: Secondary | ICD-10-CM | POA: Diagnosis present

## 2019-05-15 DIAGNOSIS — R17 Unspecified jaundice: Secondary | ICD-10-CM

## 2019-05-15 DIAGNOSIS — Z9049 Acquired absence of other specified parts of digestive tract: Secondary | ICD-10-CM | POA: Diagnosis not present

## 2019-05-15 DIAGNOSIS — R159 Full incontinence of feces: Secondary | ICD-10-CM | POA: Diagnosis present

## 2019-05-15 DIAGNOSIS — E1165 Type 2 diabetes mellitus with hyperglycemia: Secondary | ICD-10-CM | POA: Diagnosis present

## 2019-05-15 DIAGNOSIS — K838 Other specified diseases of biliary tract: Secondary | ICD-10-CM | POA: Diagnosis not present

## 2019-05-15 DIAGNOSIS — I129 Hypertensive chronic kidney disease with stage 1 through stage 4 chronic kidney disease, or unspecified chronic kidney disease: Secondary | ICD-10-CM | POA: Diagnosis present

## 2019-05-15 DIAGNOSIS — I272 Pulmonary hypertension, unspecified: Secondary | ICD-10-CM | POA: Diagnosis present

## 2019-05-15 DIAGNOSIS — Z1159 Encounter for screening for other viral diseases: Secondary | ICD-10-CM | POA: Diagnosis not present

## 2019-05-15 DIAGNOSIS — E785 Hyperlipidemia, unspecified: Secondary | ICD-10-CM | POA: Diagnosis present

## 2019-05-15 DIAGNOSIS — Z20828 Contact with and (suspected) exposure to other viral communicable diseases: Secondary | ICD-10-CM | POA: Diagnosis not present

## 2019-05-15 DIAGNOSIS — T8143XA Infection following a procedure, organ and space surgical site, initial encounter: Principal | ICD-10-CM | POA: Diagnosis present

## 2019-05-15 DIAGNOSIS — R7989 Other specified abnormal findings of blood chemistry: Secondary | ICD-10-CM

## 2019-05-15 DIAGNOSIS — D631 Anemia in chronic kidney disease: Secondary | ICD-10-CM | POA: Diagnosis not present

## 2019-05-15 DIAGNOSIS — K651 Peritoneal abscess: Secondary | ICD-10-CM

## 2019-05-15 DIAGNOSIS — R197 Diarrhea, unspecified: Secondary | ICD-10-CM | POA: Diagnosis not present

## 2019-05-15 DIAGNOSIS — R188 Other ascites: Secondary | ICD-10-CM | POA: Diagnosis not present

## 2019-05-15 DIAGNOSIS — R945 Abnormal results of liver function studies: Secondary | ICD-10-CM | POA: Diagnosis not present

## 2019-05-15 DIAGNOSIS — R932 Abnormal findings on diagnostic imaging of liver and biliary tract: Secondary | ICD-10-CM | POA: Diagnosis not present

## 2019-05-15 DIAGNOSIS — K839 Disease of biliary tract, unspecified: Secondary | ICD-10-CM | POA: Diagnosis not present

## 2019-05-15 DIAGNOSIS — R935 Abnormal findings on diagnostic imaging of other abdominal regions, including retroperitoneum: Secondary | ICD-10-CM | POA: Diagnosis not present

## 2019-05-15 LAB — CBC WITH DIFFERENTIAL/PLATELET
Abs Immature Granulocytes: 0.15 10*3/uL — ABNORMAL HIGH (ref 0.00–0.07)
Basophils Absolute: 0.1 10*3/uL (ref 0.0–0.1)
Basophils Relative: 0 %
Eosinophils Absolute: 0.4 10*3/uL (ref 0.0–0.5)
Eosinophils Relative: 3 %
HCT: 30.3 % — ABNORMAL LOW (ref 39.0–52.0)
Hemoglobin: 9.4 g/dL — ABNORMAL LOW (ref 13.0–17.0)
Immature Granulocytes: 1 %
Lymphocytes Relative: 24 %
Lymphs Abs: 4.1 10*3/uL — ABNORMAL HIGH (ref 0.7–4.0)
MCH: 28.7 pg (ref 26.0–34.0)
MCHC: 31 g/dL (ref 30.0–36.0)
MCV: 92.4 fL (ref 80.0–100.0)
Monocytes Absolute: 0.9 10*3/uL (ref 0.1–1.0)
Monocytes Relative: 5 %
Neutro Abs: 11.9 10*3/uL — ABNORMAL HIGH (ref 1.7–7.7)
Neutrophils Relative %: 67 %
Platelets: 797 10*3/uL — ABNORMAL HIGH (ref 150–400)
RBC: 3.28 MIL/uL — ABNORMAL LOW (ref 4.22–5.81)
RDW: 14.6 % (ref 11.5–15.5)
WBC: 17.6 10*3/uL — ABNORMAL HIGH (ref 4.0–10.5)
nRBC: 0 % (ref 0.0–0.2)

## 2019-05-15 LAB — COMPREHENSIVE METABOLIC PANEL
ALT: 149 U/L — ABNORMAL HIGH (ref 0–44)
AST: 92 U/L — ABNORMAL HIGH (ref 15–41)
Albumin: 2.7 g/dL — ABNORMAL LOW (ref 3.5–5.0)
Alkaline Phosphatase: 108 U/L (ref 38–126)
Anion gap: 15 (ref 5–15)
BUN: 29 mg/dL — ABNORMAL HIGH (ref 8–23)
CO2: 23 mmol/L (ref 22–32)
Calcium: 8.4 mg/dL — ABNORMAL LOW (ref 8.9–10.3)
Chloride: 100 mmol/L (ref 98–111)
Creatinine, Ser: 1.38 mg/dL — ABNORMAL HIGH (ref 0.61–1.24)
GFR calc Af Amer: 56 mL/min — ABNORMAL LOW (ref >60)
GFR calc non Af Amer: 48 mL/min — ABNORMAL LOW (ref >60)
Glucose, Bld: 220 mg/dL — ABNORMAL HIGH (ref 70–99)
Potassium: 4.6 mmol/L (ref 3.5–5.1)
Sodium: 138 mmol/L (ref 135–145)
Total Bilirubin: 0.5 mg/dL (ref 0.3–1.2)
Total Protein: 7.4 g/dL (ref 6.5–8.1)

## 2019-05-15 LAB — SARS CORONAVIRUS 2 BY RT PCR (HOSPITAL ORDER, PERFORMED IN ~~LOC~~ HOSPITAL LAB): SARS Coronavirus 2: NEGATIVE

## 2019-05-15 LAB — LACTIC ACID, PLASMA
Lactic Acid, Venous: 2.1 mmol/L (ref 0.5–1.9)
Lactic Acid, Venous: 1.9 mmol/L (ref 0.5–1.9)

## 2019-05-15 LAB — PROTIME-INR
INR: 1.1 (ref 0.8–1.2)
Prothrombin Time: 14 seconds (ref 11.4–15.2)

## 2019-05-15 LAB — MRSA PCR SCREENING: MRSA by PCR: NEGATIVE

## 2019-05-15 LAB — GLUCOSE, CAPILLARY: Glucose-Capillary: 194 mg/dL — ABNORMAL HIGH (ref 70–99)

## 2019-05-15 MED ORDER — POLYETHYLENE GLYCOL 3350 17 G PO PACK: 17.0000 g | PACK | Freq: Every day | ORAL | Status: DC | PRN

## 2019-05-15 MED ORDER — PIPERACILLIN-TAZOBACTAM 3.375 G IVPB 30 MIN
3.3750 g | Freq: Once | INTRAVENOUS | Status: AC
  Administered 2019-05-15: 13:00:00 3.375 g via INTRAVENOUS
  Filled 2019-05-15: qty 50

## 2019-05-15 MED ORDER — ACETAMINOPHEN 325 MG PO TABS: 650.0000 mg | ORAL_TABLET | Freq: Four times a day (QID) | ORAL | Status: DC | PRN

## 2019-05-15 MED ORDER — HYDRALAZINE HCL 25 MG PO TABS
50.0000 mg | ORAL_TABLET | Freq: Three times a day (TID) | ORAL | Status: DC
  Administered 2019-05-15 – 2019-05-19 (×9): 50 mg via ORAL
  Filled 2019-05-15 (×9): qty 2

## 2019-05-15 MED ORDER — PANTOPRAZOLE SODIUM 40 MG PO TBEC
40.0000 mg | DELAYED_RELEASE_TABLET | Freq: Every day | ORAL | Status: DC
  Administered 2019-05-15 – 2019-05-21 (×6): 40 mg via ORAL
  Filled 2019-05-15 (×7): qty 1

## 2019-05-15 MED ORDER — ASPIRIN EC 81 MG PO TBEC
81.0000 mg | DELAYED_RELEASE_TABLET | Freq: Every day | ORAL | Status: DC
  Administered 2019-05-16 – 2019-05-21 (×6): 81 mg via ORAL
  Filled 2019-05-15 (×6): qty 1

## 2019-05-15 MED ORDER — IOHEXOL 300 MG/ML  SOLN
100.0000 mL | Freq: Once | INTRAMUSCULAR | Status: AC | PRN
  Administered 2019-05-15: 12:00:00 100 mL via INTRAVENOUS

## 2019-05-15 MED ORDER — SODIUM CHLORIDE 0.9 % IV BOLUS
500.0000 mL | Freq: Once | INTRAVENOUS | Status: AC
  Administered 2019-05-15: 500 mL via INTRAVENOUS

## 2019-05-15 MED ORDER — ACETAMINOPHEN 650 MG RE SUPP: 650.0000 mg | Freq: Four times a day (QID) | RECTAL | Status: DC | PRN

## 2019-05-15 MED ORDER — SODIUM CHLORIDE 0.9 % IV SOLN
INTRAVENOUS | Status: AC
  Administered 2019-05-15: 20:00:00 via INTRAVENOUS

## 2019-05-15 MED ORDER — BOOST / RESOURCE BREEZE PO LIQD CUSTOM
1.0000 | Freq: Three times a day (TID) | ORAL | Status: DC
  Administered 2019-05-15 – 2019-05-20 (×10): 1 via ORAL

## 2019-05-15 MED ORDER — SODIUM CHLORIDE 0.9 % IV BOLUS
500.0000 mL | Freq: Once | INTRAVENOUS | Status: AC
  Administered 2019-05-15: 11:00:00 500 mL via INTRAVENOUS

## 2019-05-15 MED ORDER — ONDANSETRON HCL 4 MG PO TABS: 4.0000 mg | ORAL_TABLET | Freq: Four times a day (QID) | ORAL | Status: DC | PRN

## 2019-05-15 MED ORDER — PIPERACILLIN-TAZOBACTAM 3.375 G IVPB
3.3750 g | Freq: Three times a day (TID) | INTRAVENOUS | Status: DC
  Administered 2019-05-15 – 2019-05-20 (×14): 3.375 g via INTRAVENOUS
  Filled 2019-05-15 (×16): qty 50

## 2019-05-15 MED ORDER — ONDANSETRON HCL 4 MG/2ML IJ SOLN: 4.0000 mg | Freq: Four times a day (QID) | INTRAMUSCULAR | Status: DC | PRN

## 2019-05-15 MED ORDER — INSULIN ASPART 100 UNIT/ML ~~LOC~~ SOLN
0.0000 [IU] | SUBCUTANEOUS | Status: DC
  Administered 2019-05-15 – 2019-05-16 (×2): 2 [IU] via SUBCUTANEOUS
  Administered 2019-05-16: 3 [IU] via SUBCUTANEOUS
  Administered 2019-05-16: 2 [IU] via SUBCUTANEOUS
  Administered 2019-05-16: 3 [IU] via SUBCUTANEOUS
  Administered 2019-05-16: 5 [IU] via SUBCUTANEOUS
  Administered 2019-05-16: 2 [IU] via SUBCUTANEOUS
  Administered 2019-05-17: 3 [IU] via SUBCUTANEOUS
  Administered 2019-05-17: 2 [IU] via SUBCUTANEOUS
  Administered 2019-05-17: 5 [IU] via SUBCUTANEOUS
  Administered 2019-05-17 (×3): 2 [IU] via SUBCUTANEOUS
  Administered 2019-05-18 (×2): 3 [IU] via SUBCUTANEOUS
  Administered 2019-05-18 (×3): 2 [IU] via SUBCUTANEOUS
  Administered 2019-05-18: 3 [IU] via SUBCUTANEOUS
  Administered 2019-05-19: 2 [IU] via SUBCUTANEOUS
  Administered 2019-05-19: 3 [IU] via SUBCUTANEOUS
  Administered 2019-05-19: 1 [IU] via SUBCUTANEOUS
  Administered 2019-05-19 (×3): 2 [IU] via SUBCUTANEOUS
  Administered 2019-05-20 (×3): 3 [IU] via SUBCUTANEOUS
  Administered 2019-05-20: 2 [IU] via SUBCUTANEOUS
  Administered 2019-05-20: 3 [IU] via SUBCUTANEOUS
  Administered 2019-05-20: 1 [IU] via SUBCUTANEOUS
  Administered 2019-05-21: 2 [IU] via SUBCUTANEOUS
  Administered 2019-05-21: 3 [IU] via SUBCUTANEOUS
  Administered 2019-05-21 (×2): 2 [IU] via SUBCUTANEOUS

## 2019-05-15 MED ORDER — ATORVASTATIN CALCIUM 40 MG PO TABS
40.0000 mg | ORAL_TABLET | Freq: Every day | ORAL | Status: DC
  Administered 2019-05-16 – 2019-05-20 (×5): 40 mg via ORAL
  Filled 2019-05-15 (×5): qty 1

## 2019-05-15 MED ORDER — VERAPAMIL HCL ER 240 MG PO TBCR
120.0000 mg | EXTENDED_RELEASE_TABLET | Freq: Every day | ORAL | Status: DC
  Administered 2019-05-15 – 2019-05-19 (×3): 120 mg via ORAL
  Filled 2019-05-15 (×5): qty 1

## 2019-05-15 NOTE — ED Triage Notes (Signed)
Pt sent from [facility] due to abnormal [place]. Requires further testing and CT with contrast. Pt reports water like diarrhea. Denies pain

## 2019-05-15 NOTE — ED Provider Notes (Signed)
[facility] EMERGENCY DEPARTMENT Provider Note   CSN: [id number] Arrival date & time: 05/15/19  0945   History   Chief Complaint Chief Complaint  Patient presents with   Abnormal Lab    HPI Stephen MACKE Sr. is a 78 y.o. male with past medical history significant for CKD, diabetes, DVT(No anticoagulation), hypertension, hyperlipidemia who presents for evaluation of abnormal ultrasound.  Patient seen in ED on 04/19/2019 and admitted to hospitalist service for rectal bleeding.  CT abdomen pelvis at that time showed fistula tract from gallbladder to hepatic flexure of colon.  Patient underwent partial colectomy with cholecystectomy from Dr. [name] on 04/23/2019.  Patient discharged on 05/03/2019.  Patient reports he has had months of old blood in his stool since surgery earlier this month.  He has had intermittent episodes of diarrhea since his surgery.  He denies any bleeding or drainage from his wounds.  Denies any fevers.  He has been tolerating p.o. without difficulty.  His rectal bleeding did require 4 units of blood postop.  Discharged to SNF for therapy.  Has continued to have nausea since his surgery.  Had generalized weakness over the last month as well.  Had ultrasound right upper quadrant performed this morning which showed suspected foci of air within the hepatic biliary ducts.  Findings raises concern for possible degree of cholangitis.  Patient was sent to the emergency department for CT scan with contrast to rule out cholangitis.  Patient denies any complaints at this time.  Denies fever, chills, emesis, abdominal pain, chest pain, shortness of breath, unilateral weakness, slurred speech.  History obtained from patient past medical records.  No interpreter was used.     HPI  Past Medical History:  Diagnosis Date   CKD (chronic kidney disease), stage III ([place])    Diabetes mellitus    DVT (deep venous thrombosis) ([place]) 10/14/2016   Extremity edema 09/2016   Hyperlipemia      Hypertension     Patient Active Problem List   Diagnosis Date Noted   Elevated liver enzymes 05/15/2019   Intra-abdominal abscess ([place]) 05/15/2019   CKD stage 3 due to type 2 diabetes mellitus ([place]) 05/07/2019   Dyslipidemia associated with type 2 diabetes mellitus ([place]) 05/07/2019   Hypertension associated with stage 3 chronic kidney disease due to type 2 diabetes mellitus ([place]) 05/07/2019   GERD without esophagitis 05/07/2019   Calculus of gallbladder with cholecystitis without biliary obstruction    Choloenteric fistula    History of cholecystitis    Rectal bleeding 04/18/2019   Anemia 04/18/2019   Acute GI bleeding    Cholecystitis    Fever    At high risk for fluid overload 10/13/2016   Fluid overload 10/13/2016   Bilateral lower extremity edema 10/13/2016   Gait disturbance 10/13/2016   Leukocytosis 10/13/2016   CKD (chronic kidney disease), stage III ([place]) 10/13/2016   Left leg DVT ([place]) 10/13/2016   Acute renal failure (ARF) ([place]) 10/08/2016   Nausea and vomiting 10/08/2016   Type 2 diabetes mellitus, controlled, with renal complications ([place]) 78/29/5621   Transaminitis 10/08/2016   Hyperlipemia 10/08/2016   Renal insufficiency    Dehydration 10/07/2016    Past Surgical History:  Procedure Laterality Date   CHOLECYSTECTOMY N/A 04/23/2019   Procedure: CHOLECYSTECTOMY;  Surgeon: Aviva Signs, MD;  Location: [facility] ORS;  Service: General;  Laterality: N/A;   COLONOSCOPY  02/2010   Dr. [name]: diverticuolosis, multiple polyps removed, cecal adenoma.    COLONOSCOPY WITH PROPOFOL N/A 04/19/2019  Procedure: COLONOSCOPY WITH PROPOFOL;  Surgeon: [name], MD;  Location: [facility] END[facility] SUITE;  Service: Endoscopy;  Laterality: N/A;   IR GENERIC HISTORICAL  10/19/2016   IR PERC CHOLECYSTOSTOMY 10/19/2016 [name], MD [facility]   IR GENERIC HISTORICAL  10/25/2016   IR CATHETER TUBE CHANGE 10/25/2016 [name], MD [facility]    IR GENERIC HISTORICAL  11/30/2016   IR RADIOLOGIST EVAL & MGMT 11/30/2016 [facility]   PARTIAL COLECTOMY N/A 04/23/2019   Procedure: PARTIAL COLECTOMY;  Surgeon: Aviva Signs, MD;  Location: [facility];  Service: General;  Laterality: N/A;   POLYPECTOMY  04/19/2019   Procedure: POLYPECTOMY;  Surgeon: [name], MD;  Location: [facility];  Service: Endoscopy;;  polyp at recto sigmoid        Home Medications    Prior to Admission medications   Medication Sig Start Date End Date Taking? Authorizing Provider  acetaminophen (TYLENOL) 325 MG tablet Take 2 tablets (650 mg total) by mouth every 6 (six) hours as needed for mild pain, fever or headache (or Fever >/= 101). 05/03/19  Yes Emokpae, Courage, MD  Amino Acids-Protein Hydrolys (FEEDING SUPPLEMENT, PRO-STAT SUGAR FREE 64,) LIQD Take 30 mLs by mouth 2 (two) times daily between meals. 05/08/19  Yes [provider]  aspirin EC 81 MG tablet Take 81 mg by mouth daily.   Yes [provider]  atorvastatin (LIPITOR) 40 MG tablet Take 40 mg by mouth daily.   Yes [provider]  furosemide (LASIX) 20 MG tablet Take 1 tablet (20 mg total) by mouth daily with breakfast. 05/03/19  Yes Emokpae, Courage, MD  hydrALAZINE (APRESOLINE) 100 MG tablet Take 0.5 tablets (50 mg total) by mouth 3 (three) times daily. 05/03/19  Yes Emokpae, Courage, MD  lisinopril (ZESTRIL) 40 MG tablet Take 0.5 tablets (20 mg total) by mouth daily. 05/03/19  Yes Emokpae, Courage, MD  metFORMIN (GLUCOPHAGE) 500 MG tablet Take 500 mg by mouth 2 (two) times a day. 02/28/19  Yes [provider]  NON FORMULARY Diet Type:  NAS, Consistent Carbohydrate, bland diet 05/04/19  Yes [provider]  Nutritional Supplements (ENSURE CLEAR) LIQD Take 1 Bottle by mouth 2 (two) times daily between meals. 05/08/19  Yes [provider]  Nutritional Supplements (ENSURE ENLIVE PO) Take 1 Bottle by mouth daily. 05/04/19  Yes [provider]   ondansetron (ZOFRAN-ODT) 4 MG disintegrating tablet Take 1 tablet (4 mg total) by mouth every 6 (six) hours as needed for nausea. 05/03/19  Yes Emokpae, Courage, MD  pantoprazole (PROTONIX) 40 MG tablet Take 1 tablet (40 mg total) by mouth daily. 05/04/19  Yes Emokpae, Courage, MD  spironolactone (ALDACTONE) 25 MG tablet Take 25 mg by mouth daily.   Yes [provider]  verapamil (CALAN-SR) 120 MG CR tablet Take 1 tablet (120 mg total) by mouth daily. 05/03/19  Yes Roxan Hockey, MD    Family History Family History  Problem Relation Age of Onset   Heart attack Other    Tuberculosis Other    Tuberculosis Other    Diabetes type II Father    Hypertension Father    Heart attack Maternal Aunt     Social History Social History   Tobacco Use   Smoking status: Never Smoker   Smokeless tobacco: Never Used  Substance Use Topics   Alcohol use: No   Drug use: No     Allergies   Patient has no known allergies.   Review of Systems Review  of Systems  Constitutional: Negative.   HENT: Negative.   Respiratory: Negative.   Cardiovascular: Negative.   Gastrointestinal: Positive for blood in stool (old blood in stool from [facility] at [facility]. Patient denies this), diarrhea (intermittent since surgery) and nausea. Negative for abdominal distention, abdominal pain, anal bleeding, constipation, rectal pain and vomiting.  Genitourinary: Negative.   Musculoskeletal: Negative.   Skin: Negative.   Neurological: Positive for weakness (Generalized x 1 month). Negative for dizziness, tremors, seizures, syncope, facial asymmetry, speech difficulty, light-headedness, numbness and headaches.  All other systems reviewed and are negative.  Physical Exam Updated Vital Signs BP (!) 147/75    Pulse 87    Temp 98.2 F (36.8 C) (Oral)    Resp 20    Ht 5\' 6"  (1.676 m)    Wt 92.1 kg    SpO2 96%    BMI 32.77 kg/m   Physical Exam Vitals signs and nursing note reviewed.  Constitutional:       General: He is not in acute distress.    Appearance: He is well-developed. He is not ill-appearing, toxic-appearing or diaphoretic.  HENT:     Head: Normocephalic and atraumatic.     Nose: Nose normal.     Mouth/Throat:     Comments: Mildly dry Eyes:     Pupils: Pupils are equal, round, and reactive to light.     Comments: No horizontal, vertical or rotational nystagmus   Neck:     Musculoskeletal: Normal range of motion and neck supple.     Comments: Full active and passive ROM without pain No midline or paraspinal tenderness No nuchal rigidity or meningeal signs  Cardiovascular:     Rate and Rhythm: Normal rate and regular rhythm.     Pulses: Normal pulses.     Heart sounds: Normal heart sounds.     Comments: Tachycardic at 110.  No murmurs, rubs or gallops. Pulmonary:     Effort: Pulmonary effort is normal. No respiratory distress.     Comments: Clear to auscultation bilaterally without wheeze, rhonchi or rales.  Speaks in full sentences without difficulty. Abdominal:     General: There is no distension.     Palpations: Abdomen is soft.     Comments: Soft without rebound or guarding.  Surgical site with serosanguineous drainage to right upper quadrant.  No obvious abdominal wall herniations.  Surgical sites appear to be healing well.  No surrounding erythema, warmth or swelling.  Musculoskeletal: Normal range of motion.  Skin:    General: Skin is warm and dry.  Neurological:     Mental Status: He is alert.     Comments: Mental Status:  Alert, oriented, thought content appropriate. Speech fluent without evidence of aphasia. Able to follow 2 step commands without difficulty.  Cranial Nerves:  II:  Peripheral visual fields grossly normal, pupils equal, round, reactive to light III,IV, VI: ptosis not present, extra-ocular motions intact bilaterally  V,VII: smile symmetric, facial light touch sensation equal VIII: hearing grossly normal bilaterally  IX,X: midline uvula rise  XI:  bilateral shoulder shrug equal and strong XII: midline tongue extension  Motor:  5/5 in upper and lower extremities bilaterally including strong and equal grip strength and dorsiflexion/plantar flexion Sensory: Pinprick and light touch normal in all extremities.  Deep Tendon Reflexes: 2+ and symmetric  Cerebellar: normal finger-to-nose with bilateral upper extremities Gait: normal gait and balance CV: distal pulses palpable throughout       ED Treatments / Results  Labs (all labs ordered  are listed, but only abnormal results are displayed) Labs Reviewed  CBC WITH DIFFERENTIAL/PLATELET - Abnormal; Notable for the following components:      Result Value   WBC 17.6 (*)    RBC 3.28 (*)    Hemoglobin 9.4 (*)    HCT 30.3 (*)    Platelets 797 (*)    Neutro Abs 11.9 (*)    Lymphs Abs 4.1 (*)    Abs Immature Granulocytes 0.15 (*)    All other components within normal limits  COMPREHENSIVE METABOLIC PANEL - Abnormal; Notable for the following components:   Glucose, Bld 220 (*)    BUN 29 (*)    Creatinine, Ser 1.38 (*)    Calcium 8.4 (*)    Albumin 2.7 (*)    AST 92 (*)    ALT 149 (*)    GFR calc non Af Amer 48 (*)    GFR calc Af Amer 56 (*)    All other components within normal limits  LACTIC ACID, PLASMA - Abnormal; Notable for the following components:   Lactic Acid, Venous 2.1 (*)    All other components within normal limits  SARS CORONAVIRUS 2 (HOSPITAL ORDER, [facility])  CULTURE, BLOOD (ROUTINE X 2)  CULTURE, BLOOD (ROUTINE X 2)  LACTIC ACID, PLASMA  PROTIME-INR    EKG None  Radiology Ct Abdomen Pelvis W Contrast  Result Date: 05/15/2019 CLINICAL DATA:  Diarrhea, no pain EXAM: CT ABDOMEN AND PELVIS WITH CONTRAST TECHNIQUE: Multidetector CT imaging of the abdomen and pelvis was performed using the standard protocol following bolus administration of intravenous contrast. CONTRAST:  127mL OMNIPAQUE IOHEXOL 300 MG/ML  SOLN COMPARISON:   04/19/2019 FINDINGS: Lower chest: Small right pleural effusion. Trace left pleural effusion. Right basilar atelectasis. Right gynecomastia again noted. Hepatobiliary: No focal hepatic mass. No intrahepatic fluid collection. No interval change in minimal intrahepatic biliary duct dilatation centrally. Interval cholecystectomy. 3.5 x 4.6 cm complex fluid collection in the gallbladder fossa with an air-fluid level concerning for an abscess. Pancreas: Unremarkable. No pancreatic ductal dilatation or surrounding inflammatory changes. Spleen: Normal in size without focal abnormality. Adrenals/Urinary Tract: Normal adrenal glands. Stable 2 cm left renal cyst. Kidneys otherwise normal. No urolithiasis or obstructive uropathy. Normal bladder. Stomach/Bowel: No bowel dilatation to suggest bowel obstruction. Gastric air-fluid level with wall thickening of the antrum and proximal duodenum which may reflect an element of gastric outlet obstruction. Bowel wall thickening and surrounding inflammatory changes around the proximal ascending colon concerning for mild colitis. Normal appendix. 2.8 cm fluid collection along the anti mesenteric aspect of the proximal ascending colon along the anterior abdominal wall most consistent with an abscess. Vascular/Lymphatic: Normal caliber abdominal aorta with mild atherosclerosis. No lymphadenopathy. Reproductive: Prostate is unremarkable. Other: No abdominal wall hernia or abnormality. No abdominopelvic ascites. Postsurgical changes in the subcutaneous fat of the right anterior abdominal wall. Musculoskeletal: No acute osseous abnormality. No aggressive osseous lesion. Degenerative disease with disc height loss at L2-3, L3-4 L4-5, L5-S1. IMPRESSION: 1. Interval cholecystectomy. 3.5 x 4.6 cm complex fluid collection in the gallbladder fossa with an air-fluid level concerning for an abscess. No CT evidence of cholangitis. 2. Gastric air-fluid level with reactive wall thickening of the antrum  and proximal duodenum which may reflect an element of gastric outlet obstruction. Bowel wall thickening and surrounding inflammatory changes around the proximal ascending colon concerning for mild colitis. 2.8 cm fluid collection along the anti mesenteric aspect of the proximal ascending colon along the anterior abdominal wall  most consistent with an abscess. 3. Small bilateral pleural effusions. Electronically Signed   By: Kathreen Devoid   On: 05/15/2019 12:16   US Abdomen Limited Ruq  Result Date: 05/15/2019 CLINICAL DATA:  Recent cholecystectomy.  Elevated liver enzymes EXAM: ULTRASOUND ABDOMEN LIMITED RIGHT UPPER QUADRANT COMPARISON:  CT abdomen and pelvis Apr 19, 2019 FINDINGS: Gallbladder: Surgically absent. No lesions seen in the gallbladder fossa by ultrasound. Common bile duct: Diameter: 10 mm which is upper normal in size for post cholecystectomy state. No appreciable intrahepatic biliary duct dilatation evident. Note that portions of the common bile duct are obscured by gas. Liver: Echogenic foci which shadow are noted in the liver, likely with in intrahepatic ducts. Appearance suggests air in these areas. No liver mass lesions are evident. Within normal limits in parenchymal echogenicity. Portal vein is patent on color Doppler imaging with normal direction of blood flow towards the liver. There is a small right pleural effusion. IMPRESSION: 1. Suspect foci of air within intrahepatic biliary ducts. This finding raises concern for potential degree of cholangitis. The common bile duct is upper normal in size for post cholecystectomy state. Note that portions of the common bile duct are not seen. Given these concerns, CT or MR, ideally with intravenous contrast, advised to further assess. 2. Gallbladder absent. No lesions seen by ultrasound in the gallbladder fossa region. 3.  Small right pleural effusion. These results will be called to the ordering clinician or representative by the Radiologist  Assistant, and communication documented in the PACS or zVision Dashboard. Electronically Signed   By: Lowella Grip III M.D.   On: 05/15/2019 09:15    Procedures Procedures (including critical care time)  Medications Ordered in ED Medications  piperacillin-tazobactam (ZOSYN) IVPB 3.375 g (has no administration in time range)  sodium chloride 0.9 % bolus 500 mL (0 mLs Intravenous Stopped 05/15/19 1150)  iohexol (OMNIPAQUE) 300 MG/ML solution 100 mL (100 mLs Intravenous Contrast Given 05/15/19 1133)  piperacillin-tazobactam (ZOSYN) IVPB 3.375 g ( Intravenous Stopped 05/15/19 1343)  sodium chloride 0.9 % bolus 500 mL (500 mLs Intravenous New Bag/Given 05/15/19 1352)    Initial Impression / Assessment and Plan / ED Course  I have reviewed the triage vital signs and the nursing notes.  Pertinent labs & imaging results that were available during my care of the patient were reviewed by me and considered in my medical decision making (see chart for details).  78 year old male presents for evaluation of abnormal ultrasound.  Afebrile, nonseptic appearing.  Patient recently postop on 04/23/2023 colectomy with cholecystectomy for gallbladder/hepatic fistula.  Serosanguineous fluid to surgical sites.  Appear to be healing well.  Abdomen soft, nontender without rebound or guarding.  No evidence of infection to surgical sites.  Patient has had generalized weakness and nausea post surgery. He did require 4 unit blood transfusion after surgery.  Non focal neuro exam without deficets.  Low suspicion for CVA as cause of his generalized weakness.  Likely related to his chronic anemia as well as overall deconditioning post surgery.  Refused GU exam to assess for possible occult blood. Was placed in SNF at dc.  Has had intermittent dark blood in his stool since surgery.  Patient denies this however information obtained from nursing note from facility.  Tolerating p.o. intake without difficulty.  He is mildly  tachycardic at 110.  Does not appear septic.  Ultrasound obtained today shows possible air in hepatic duct concerning for possible degree of cholangitis at SNF.  Radiologist recommendation to obtain  CT scan with contrast to r/o cholangitis.  Patient denies any pain.  Will also obtain basic labs.  Hearts and lungs clear.   Labs and imaging personally reviewed: CBC with Leukocytosis at 17.6, previous 14.4, 13.5 hemoglobin 9.4, improved from previous, baseline around 8.7. CMP hyperglycemia at 220, creatinine 1.38 (CKD), at baseline, elevated LFT however improved from previous Lactic acid 2.1, question dehydration vs sepsis Blood culture obtained COVID negative CT [facility] with gallbladder fossa abscess, proximal acceding colon complex fluid collection consistent with abscess, Gastric thickening consistent with possible outlet obstruction.  1300: Consulted with Dr. [name], General surgery who recommends hospitalit to admit, IV ABX (Zosyn), IR to possibly place drain. He will consult on admission if admitted to [facility] which he prefers patient to stay here. COVID obtained.  Patient without any current pain.  On reevaluation abdomen soft.  1445: Hospitalist have not return call, will re-page. COPVID negative. Lactic elevated at 2.1, question dehydration vs infection. Patient given IVF and ABX. He continues to look very well.  Dr. [name] has evaluated patient.  Agrees with prior plan.  Awaiting hospitalist consultation.   1520: Consulted with hospitalist Dr. Denton Brick who will evaluate patient to admit.  Would like me to speak with IR for possible drainage today.  1533: Consulted with Dr. Pascal Lux, interventional radiology.  States abscesses are not large enough to drain at this time.  Recommends IV antibiotics, monitoring for 2-3 days and repeat CT imaging to assess for increased fluid collection.  Patient continues to be hemodynamically stable without tachycardia, tachypnea or hypoxia.  He continues to be afebrile.   He appears otherwise well.  Vital signs stable.  He does not have any pain at this current time.  Patient has been seen evaluated by my attending, Dr. Rogene Houston who agrees with above treatment, plan and disposition.  The patient appears reasonably stabilized for admission considering the current resources, flow, and capabilities available in the ED at this time, and I doubt any other [facility] requiring further screening and/or treatment in the ED prior to admission.     Final Clinical Impressions(s) / ED Diagnoses   Final diagnoses:  Postoperative intra-abdominal abscess  Hyperglycemia  Elevated LFTs    ED Discharge Orders    None       Artavius Stearns A, PA-C 05/15/19 1604    Fredia Sorrow, MD 05/20/19 1645

## 2019-05-15 NOTE — Progress Notes (Signed)
Location:   [facility] Room Number: 156 Place of Service:  SNF (31)    CODE STATUS: full code   No Known Allergies  Chief Complaint  Patient presents with  . Acute Visit    follow up ultrasound    HPI:  He had an ultrasound this AM which demonstrates suspected foci of air within intrahepatic biliary ducts; potentia degree of cholangitis.  He has elevated liver enzymes and wbc. He complains of having a poor appetite; no nausea or vomiting. He does have frequent diarrheal stools; he has recently had blood in his stools. He has recently been treated conservatively for an ileus. There are no reports of fevers.     Past Medical History:  Diagnosis Date  . CKD (chronic kidney disease), stage III ([place])   . Diabetes mellitus   . DVT (deep venous thrombosis) ([place]) 10/14/2016  . Extremity edema 09/2016  . Hyperlipemia   . Hypertension     Past Surgical History:  Procedure Laterality Date  . CHOLECYSTECTOMY N/A 04/23/2019   Procedure: CHOLECYSTECTOMY;  Surgeon: Aviva Signs, MD;  Location: [facility];  Service: General;  Laterality: N/A;  . COLONOSCOPY  02/2010   Dr. [name]: diverticuolosis, multiple polyps removed, cecal adenoma.   . COLONOSCOPY WITH PROPOFOL N/A 04/19/2019   Procedure: COLONOSCOPY WITH PROPOFOL;  Surgeon: [name], MD;  Location: [facility];  Service: Endoscopy;  Laterality: N/A;  . IR GENERIC HISTORICAL  10/19/2016   IR PERC CHOLECYSTOSTOMY 10/19/2016 [name], MD [facility]  . IR GENERIC HISTORICAL  10/25/2016   IR CATHETER TUBE CHANGE 10/25/2016 [name], MD [facility]  . IR GENERIC HISTORICAL  11/30/2016   IR RADIOLOGIST EVAL & MGMT 11/30/2016 [facility]  . PARTIAL COLECTOMY N/A 04/23/2019   Procedure: PARTIAL COLECTOMY;  Surgeon: Aviva Signs, MD;  Location: [facility];  Service: General;  Laterality: N/A;  . POLYPECTOMY  04/19/2019   Procedure: POLYPECTOMY;  Surgeon: [name], MD;  Location: [facility];  Service:  Endoscopy;;  polyp at recto sigmoid    Social History   Socioeconomic History  . Marital status: Widowed    Spouse name: Not on file  . Number of children: Not on file  . Years of education: Not on file  . Highest education level: Not on file  Occupational History  . Not on file  Social Needs  . Financial resource strain: Not on file  . Food insecurity    Worry: Not on file    Inability: Not on file  . Transportation needs    Medical: Not on file    Non-medical: Not on file  Tobacco Use  . Smoking status: Never Smoker  . Smokeless tobacco: Never Used  Substance and Sexual Activity  . Alcohol use: No  . Drug use: No  . Sexual activity: Not on file  Lifestyle  . Physical activity    Days per week: Not on file    Minutes per session: Not on file  . Stress: Not on file  Relationships  . Social Herbalist on phone: Not on file    Gets together: Not on file    Attends religious service: Not on file    Active member of club or organization: Not on file    Attends meetings of clubs or organizations: Not on file    Relationship status: Not on file  . Intimate partner violence    Fear of current or ex  partner: Not on file    Emotionally abused: Not on file    Physically abused: Not on file    Forced sexual activity: Not on file  Other Topics Concern  . Not on file  Social History Narrative  . Not on file   Family History  Problem Relation Age of Onset  . Heart attack Other   . Tuberculosis Other   . Tuberculosis Other   . Diabetes type II Father   . Hypertension Father   . Heart attack Maternal Aunt       VITAL SIGNS BP 131/74   Pulse 91   Temp 97.6 F (36.4 C)   Resp 18   Ht '5\' 6"'  (1.676 m)   Wt 200 lb 6.4 oz (90.9 kg)   BMI 32.35 kg/m   Outpatient Encounter Medications as of 05/15/2019  Medication Sig  . acetaminophen (TYLENOL) 325 MG tablet Take 2 tablets (650 mg total) by mouth every 6 (six) hours as needed for mild pain, fever or  headache (or Fever >/= 101).  . Amino Acids-Protein Hydrolys (FEEDING SUPPLEMENT, PRO-STAT SUGAR FREE 64,) LIQD Take 30 mLs by mouth 2 (two) times daily between meals.  Marland Kitchen atorvastatin (LIPITOR) 40 MG tablet Take 40 mg by mouth daily.  . furosemide (LASIX) 20 MG tablet Take 1 tablet (20 mg total) by mouth daily with breakfast.  . hydrALAZINE (APRESOLINE) 100 MG tablet Take 0.5 tablets (50 mg total) by mouth 3 (three) times daily.  Marland Kitchen lisinopril (ZESTRIL) 40 MG tablet Take 0.5 tablets (20 mg total) by mouth daily.  . metFORMIN (GLUCOPHAGE) 500 MG tablet Take 500 mg by mouth 2 (two) times a day.  . NON FORMULARY Diet Type:  NAS, Consistent Carbohydrate, bland diet  . Nutritional Supplements (ENSURE CLEAR) LIQD Take 1 Bottle by mouth 2 (two) times daily between meals.  . Nutritional Supplements (ENSURE ENLIVE PO) Take 1 Bottle by mouth daily.  . ondansetron (ZOFRAN-ODT) 4 MG disintegrating tablet Take 1 tablet (4 mg total) by mouth every 6 (six) hours as needed for nausea.  . pantoprazole (PROTONIX) 40 MG tablet Take 1 tablet (40 mg total) by mouth daily.  Marland Kitchen spironolactone (ALDACTONE) 25 MG tablet Take 25 mg by mouth daily.  . verapamil (CALAN-SR) 120 MG CR tablet Take 1 tablet (120 mg total) by mouth daily.   No facility-administered encounter medications on file as of 05/15/2019.      SIGNIFICANT DIAGNOSTIC EXAMS   PREVIOUS;   04-19-19: ct of abdomen and pelvis:  1. Findings highly suspicious for a fistula track between the gallbladder lumen and hepatic flexure of the colon. There are multiple pockets of gas and debris within the gallbladder lumen. There is no definite CT evidence of acute cholecystitis. 2. Moderate dilatation of the common bile duct. Correlation with laboratory studies is recommended to help exclude an obstructing process. 3. Significantly distended urinary bladder. 4. No specific abnormality identified to explain the patient's painless hematochezia. 5. Cardiomegaly.   Bilateral gynecomastia is noted.  04-19-19: colonoscopy: One 4 mm polyp at the recto-sigmoid colon, removed with a cold snare. Resected andvretrieved. Colonic lipoma. Markedly abnormal area of colon at the hepatic flexure of uncertain etiology. (Query fistula,vatypical presentation for infiltrating tumor or possibly related to previously placedvcholecystostomy tube). This area is suspicious for site of bleeding.  04-28-19: renal ultrasound: Normal size kidneys without hydronephrosis.  Bilateral renal cysts.  04-29-19 left knee x-ray: No acute findings. Small joint effusion.  Mild to moderate osteoarthritis.  04-30-19: chest x-ray: Mild  opacity in the right base may represent a small effusion with atelectasis versus infiltrate. Recommend follow-up to resolution.  05-07-19: kub: Nonobstructive bowel gas pattern. Mild distention of the stomach and colon may be due to ileus.  TODAY:   05-15-19: right upper quad ultrasound:  1. Suspect foci of air within intrahepatic biliary ducts. This finding raises concern for potential degree of cholangitis. The common bile duct is upper normal in size for post cholecystectomy state. Note that portions of the common bile duct are not seen. Given these concerns, CT or MR, ideally with intravenous contrast, advised to further assess. 2. Gallbladder absent. No lesions seen by ultrasound in the gallbladder fossa region. 3.  Small right pleural effusion.    LABS REVIEWED PREVIOUS;   04-18-19: wbc 13.5; hgb 11.1; hct 36.2; mcv 96.5 plt 288; glucose 191; bun 21; creat 1.63; k+ 4.4; an++ 142; ca 8.6; liver normal albumin 3.6 04-20-19: wbc 6.4; hgb 9.0; hct 28.6; mcv 93.5; plt 219; glucose 147; bun 12; creat 1.15; k+ 4.2; na++ 138; ca 8.2; liver normal albumin 3.3 04-22-19: wbc 7.2; hgb 9.4; hct 29.6 plt 293 04-24-19; wbc 12.2; hgb 8.9; hct  28.7 mcv 95.7; plt 240; glucose 227; bun 19; creat 1.48; k+ 5.4; na++ 136; ca 7.4; liver normal albumin 2.8  04-26-19; wbc 12.8; hgb 7.1;  hct 23.0; mcv 94.3; plt 218; glucose 152; bun 14; creat 1.24; k+ 3.9; na++ 137; ca 7.2; liver normal albumin 2.4 mag 1.9 04-28-19; wbc 12.1; hgb 7.8; hct 24.3 mcv 90.3; plt 254; glucose 185; bun 27; creat 2.01; k+ 3.9; na++ 138 ca 7.5 mag 2.2 04-29-19; sed rate 137 05-01-19; wbc 12.3; hgb 9.5; hct 30.0; mcv 90.9; plt 327; glucose 168; bun 17; creat 1.32; k+ 3.4; na++ 141; ca 7.4; liver normal albumin 1.9;  05-03-19: wbc 13.0; hgb 8.8; hct 27.7; mcv 92.0 plt 365  05-08-19: glucose 75; bun 20; creat 1.58; k+ 4.1; na++ 140; ca 7.7; mag 1.8  05-14-19: wbc 14.4; hgb 8.7; ct 28.2; mcv 92.5 plt 749; glucose 195; bun 25; creat 1.41; k+ 4.6; na++ 138; ca 8.0; ast 107 alt 157; alk phos 101; total bili 0.2; albumin 2.2   NO NEW LABS.   Review of Systems  Constitutional: Positive for malaise/fatigue.  Respiratory: Negative for cough and shortness of breath.   Cardiovascular: Negative for chest pain, palpitations and leg swelling.  Gastrointestinal: Positive for blood in stool and diarrhea. Negative for abdominal pain, heartburn, nausea and vomiting.       Poor appetite   Musculoskeletal: Negative for back pain, joint pain and myalgias.  Skin: Negative.   Neurological: Negative for dizziness.  Psychiatric/Behavioral: The patient is not nervous/anxious.     Physical Exam Constitutional:      General: He is not in acute distress.    Appearance: He is well-developed. He is obese. He is not diaphoretic.  Neck:     Musculoskeletal: Neck supple.     Thyroid: No thyromegaly.  Cardiovascular:     Rate and Rhythm: Normal rate. Rhythm irregular.     Pulses: Normal pulses.     Heart sounds: Normal heart sounds.  Pulmonary:     Effort: Pulmonary effort is normal. No respiratory distress.     Breath sounds: Normal breath sounds.  Abdominal:     General: Bowel sounds are normal. There is distension.     Palpations: Abdomen is soft.     Tenderness: There is no abdominal tenderness.  Musculoskeletal:     Right  lower leg: No edema.     Left lower leg: No edema.     Comments: Left knee swelling present: history of aspiration      Lymphadenopathy:     Cervical: No cervical adenopathy.  Skin:    General: Skin is warm and dry.  Neurological:     Mental Status: He is alert and oriented to person, place, and time.  Psychiatric:        Mood and Affect: Mood normal.       ASSESSMENT/ PLAN:  TODAY:   1. 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 Lyndel Safe will send him to the ED for further workup and treatment.        MD is aware of resident's narcotic use and is in agreement with current plan of care. We will attempt to wean resident as apropriate   Ok Edwards NP [facility]  Contact [phone] Monday through Friday 8am- 5pm  After hours call [phone]

## 2019-05-15 NOTE — Consult Note (Addendum)
Reason for Consult: Intra-abdominal abscess, leukocytosis Referring Physician: Emergency department  Stephen Bales Sr. is an 78 y.o. male.  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  This subsequently resolved.  He was then discharged to a skilled nursing unit for further rehabilitation.  He was noted to have elevated liver enzyme tests and ultrasound was performed which revealed a question of cholangitis and hepatic fossa fluid.  A CT scan of the abdomen and pelvis was performed which revealed intra-abdominal abscess collection in the hepatic fossa as well as some haziness of the ascending colon.  Patient was noted to have a leukocytosis.  He states he feels fine.  He still has loose bowel movements, but this has been a problem for him in the past.  He denies any nausea or vomiting.  He denies any abdominal pain.  Past Medical History:  Diagnosis Date  . CKD (chronic kidney disease), stage III ([place])   . Diabetes mellitus   . DVT (deep venous thrombosis) ([place]) 10/14/2016  . Extremity edema 09/2016  . Hyperlipemia   . Hypertension     Past Surgical History:  Procedure Laterality Date  . CHOLECYSTECTOMY N/A 04/23/2019   Procedure: CHOLECYSTECTOMY;  Surgeon: Aviva Signs, MD;  Location: [facility];  Service: General;  Laterality: N/A;  . COLONOSCOPY  02/2010   Dr. [name]: diverticuolosis, multiple polyps removed, cecal adenoma.   . COLONOSCOPY WITH PROPOFOL N/A 04/19/2019   Procedure: COLONOSCOPY WITH PROPOFOL;  Surgeon: [name], MD;  Location: [facility];  Service: Endoscopy;  Laterality: N/A;  . IR GENERIC HISTORICAL  10/19/2016   IR PERC CHOLECYSTOSTOMY 10/19/2016 [name], MD [facility]  . IR GENERIC HISTORICAL  10/25/2016   IR CATHETER TUBE CHANGE 10/25/2016 [name],  MD [facility]  . IR GENERIC HISTORICAL  11/30/2016   IR RADIOLOGIST EVAL & MGMT 11/30/2016 [facility]  . PARTIAL COLECTOMY N/A 04/23/2019   Procedure: PARTIAL COLECTOMY;  Surgeon: Aviva Signs, MD;  Location: [facility];  Service: General;  Laterality: N/A;  . POLYPECTOMY  04/19/2019   Procedure: POLYPECTOMY;  Surgeon: [name], MD;  Location: [facility];  Service: Endoscopy;;  polyp at recto sigmoid    Family History  Problem Relation Age of Onset  . Heart attack Other   . Tuberculosis Other   . Tuberculosis Other   . Diabetes type II Father   . Hypertension Father   . Heart attack Maternal Aunt     Social History:  reports that he has never smoked. He has never used smokeless tobacco. He reports that he does not drink alcohol or use drugs.  Allergies: No Known Allergies  Medications: I have reviewed the patient's current medications.  Results for orders placed or performed during the [facility] encounter of 05/15/19 (from the past 48 hour(s))  CBC with Differential     Status: Abnormal   Collection Time: 05/15/19 10:11 AM  Result Value Ref Range   WBC 17.6 (H) 4.0 - 10.5 K/uL   RBC 3.28 (L) 4.22 - 5.81 MIL/uL   Hemoglobin 9.4 (L) 13.0 - 17.0 g/dL   HCT 30.3 (L) 39.0 - 52.0 %   MCV 92.4 80.0 - 100.0 fL   MCH 28.7 26.0 - 34.0 pg   MCHC 31.0 30.0 - 36.0 g/dL   RDW 14.6 11.5 -  15.5 %   Platelets 797 (H) 150 - 400 K/uL   nRBC 0.0 0.0 - 0.2 %   Neutrophils Relative % 67 %   Neutro Abs 11.9 (H) 1.7 - 7.7 K/uL   Lymphocytes Relative 24 %   Lymphs Abs 4.1 (H) 0.7 - 4.0 K/uL   Monocytes Relative 5 %   Monocytes Absolute 0.9 0.1 - 1.0 K/uL   Eosinophils Relative 3 %   Eosinophils Absolute 0.4 0.0 - 0.5 K/uL   Basophils Relative 0 %   Basophils Absolute 0.1 0.0 - 0.1 K/uL   Immature Granulocytes 1 %   Abs Immature Granulocytes 0.15 (H) 0.00 - 0.07 K/uL    Comment: Performed at [facility], [address] [place], [place] [address]  Comprehensive metabolic panel      Status: Abnormal   Collection Time: 05/15/19 10:11 AM  Result Value Ref Range   Sodium 138 135 - 145 mmol/L   Potassium 4.6 3.5 - 5.1 mmol/L   Chloride 100 98 - 111 mmol/L   CO2 23 22 - 32 mmol/L   Glucose, Bld 220 (H) 70 - 99 mg/dL   BUN 29 (H) 8 - 23 mg/dL   Creatinine, Ser 1.38 (H) 0.61 - 1.24 mg/dL   Calcium 8.4 (L) 8.9 - 10.3 mg/dL   Total Protein 7.4 6.5 - 8.1 g/dL   Albumin 2.7 (L) 3.5 - 5.0 g/dL   AST 92 (H) 15 - 41 U/L   ALT 149 (H) 0 - 44 U/L   Alkaline Phosphatase 108 38 - 126 U/L   Total Bilirubin 0.5 0.3 - 1.2 mg/dL   GFR calc non Af Amer 48 (L) >60 mL/min   GFR calc Af Amer 56 (L) >60 mL/min   Anion gap 15 5 - 15    Comment: Performed at [facility], [address], [place], [place] [address]  SARS Coronavirus 2 (CEPHEID - Performed in [facility]), Hosp Order     Status: None   Collection Time: 05/15/19 12:28 PM   Specimen: Nasopharyngeal Swab  Result Value Ref Range   SARS Coronavirus 2 NEGATIVE NEGATIVE    Comment: (NOTE) If result is NEGATIVE SARS-CoV-2 target nucleic acids are NOT DETECTED. The SARS-CoV-2 RNA is generally detectable in upper and lower  respiratory specimens during the acute phase of infection. The lowest  concentration of SARS-CoV-2 viral copies this assay can detect is 250  copies / mL. A negative result does not preclude SARS-CoV-2 infection  and should not be used as the sole basis for treatment or other  patient management decisions.  A negative result may occur with  improper specimen collection / handling, submission of specimen other  than nasopharyngeal swab, presence of viral mutation(s) within the  areas targeted by this assay, and inadequate number of viral copies  (<250 copies / mL). A negative result must be combined with clinical  observations, patient history, and epidemiological information. If result is POSITIVE SARS-CoV-2 target nucleic acids are DETECTED. The SARS-CoV-2 RNA is generally detectable in upper  and lower  respiratory specimens dur ing the acute phase of infection.  Positive  results are indicative of active infection with SARS-CoV-2.  Clinical  correlation with patient history and other diagnostic information is  necessary to determine patient infection status.  Positive results do  not rule out bacterial infection or co-infection with other viruses. If result is PRESUMPTIVE POSTIVE SARS-CoV-2 nucleic acids MAY BE PRESENT.   A presumptive positive result was obtained on the submitted  specimen  and confirmed on repeat testing.  While 2019 novel coronavirus  (SARS-CoV-2) nucleic acids may be present in the submitted sample  additional confirmatory testing may be necessary for epidemiological  and / or clinical management purposes  to differentiate between  SARS-CoV-2 and other Sarbecovirus currently known to infect humans.  If clinically indicated additional testing with an alternate test  methodology [phone]) is advised. The SARS-CoV-2 RNA is generally  detectable in upper and lower respiratory sp ecimens during the acute  phase of infection. The expected result is Negative. Fact Sheet for Patients:  StrictlyIdeas.no Fact Sheet for Healthcare Providers: BankingDealers.co.za This test is not yet approved or cleared by the [place] FDA and has been authorized for detection and/or diagnosis of SARS-CoV-2 by FDA under an Emergency Use Authorization (EUA).  This EUA will remain in effect (meaning this test can be used) for the duration of the COVID-19 declaration under Section 564(b)(1) of the Act, 21 U.S.C. section 360bbb-3(b)(1), unless the authorization is terminated or revoked sooner. Performed at [facility], [address], [place], [place] [address]   Blood culture (routine x 2)     Status: None (Preliminary result)   Collection Time: 05/15/19 12:59 PM   Specimen: BLOOD RIGHT HAND  Result Value Ref Range   Specimen  Description BLOOD RIGHT HAND    Special Requests      BOTTLES DRAWN AEROBIC AND ANAEROBIC Blood Culture results may not be optimal due to an inadequate volume of blood received in culture bottles Performed at [facility], [address] [place], [place] [address]    Culture PENDING    Report Status PENDING   Blood culture (routine x 2)     Status: None (Preliminary result)   Collection Time: 05/15/19  1:05 PM   Specimen: BLOOD RIGHT ARM  Result Value Ref Range   Specimen Description BLOOD RIGHT ARM    Special Requests      BOTTLES DRAWN AEROBIC AND ANAEROBIC Blood Culture results may not be optimal due to an inadequate volume of blood received in culture bottles Performed at [facility], [address], [place], [place] [address]    Culture PENDING    Report Status PENDING   Lactic acid, plasma     Status: Abnormal   Collection Time: 05/15/19  1:05 PM  Result Value Ref Range   Lactic Acid, Venous 2.1 (HH) 0.5 - 1.9 mmol/L    Comment: CRITICAL RESULT CALLED TO, READ BACK BY AND VERIFIED WITH: DANIELS,B AT 1345 ON 6.23.20 BY ISLEY,B Performed at [facility], [address] [place], [place] [address]   Lactic acid, plasma     Status: None   Collection Time: 05/15/19  2:57 PM  Result Value Ref Range   Lactic Acid, Venous 1.9 0.5 - 1.9 mmol/L    Comment: Performed at [facility], [address] [place], [place] [address]    Ct Abdomen Pelvis W Contrast  Result Date: 05/15/2019 CLINICAL DATA:  Diarrhea, no pain EXAM: CT ABDOMEN AND PELVIS WITH CONTRAST TECHNIQUE: Multidetector CT imaging of the abdomen and pelvis was performed using the standard protocol following bolus administration of intravenous contrast. CONTRAST:  185mL OMNIPAQUE IOHEXOL 300 MG/ML  SOLN COMPARISON:  04/19/2019 FINDINGS: Lower chest: Small right pleural effusion. Trace left pleural effusion. Right basilar atelectasis. Right gynecomastia again noted. Hepatobiliary: No focal hepatic mass. No intrahepatic fluid  collection. No interval change in minimal intrahepatic biliary duct dilatation centrally. Interval cholecystectomy. 3.5 x 4.6 cm complex fluid collection in  the gallbladder fossa with an air-fluid level concerning for an abscess. Pancreas: Unremarkable. No pancreatic ductal dilatation or surrounding inflammatory changes. Spleen: Normal in size without focal abnormality. Adrenals/Urinary Tract: Normal adrenal glands. Stable 2 cm left renal cyst. Kidneys otherwise normal. No urolithiasis or obstructive uropathy. Normal bladder. Stomach/Bowel: No bowel dilatation to suggest bowel obstruction. Gastric air-fluid level with wall thickening of the antrum and proximal duodenum which may reflect an element of gastric outlet obstruction. Bowel wall thickening and surrounding inflammatory changes around the proximal ascending colon concerning for mild colitis. Normal appendix. 2.8 cm fluid collection along the anti mesenteric aspect of the proximal ascending colon along the anterior abdominal wall most consistent with an abscess. Vascular/Lymphatic: Normal caliber abdominal aorta with mild atherosclerosis. No lymphadenopathy. Reproductive: Prostate is unremarkable. Other: No abdominal wall hernia or abnormality. No abdominopelvic ascites. Postsurgical changes in the subcutaneous fat of the right anterior abdominal wall. Musculoskeletal: No acute osseous abnormality. No aggressive osseous lesion. Degenerative disease with disc height loss at L2-3, L3-4 L4-5, L5-S1. IMPRESSION: 1. Interval cholecystectomy. 3.5 x 4.6 cm complex fluid collection in the gallbladder fossa with an air-fluid level concerning for an abscess. No CT evidence of cholangitis. 2. Gastric air-fluid level with reactive wall thickening of the antrum and proximal duodenum which may reflect an element of gastric outlet obstruction. Bowel wall thickening and surrounding inflammatory changes around the proximal ascending colon concerning for mild colitis. 2.8 cm  fluid collection along the anti mesenteric aspect of the proximal ascending colon along the anterior abdominal wall most consistent with an abscess. 3. Small bilateral pleural effusions. Electronically Signed   By: Kathreen Devoid   On: 05/15/2019 12:16   US Abdomen Limited Ruq  Result Date: 05/15/2019 CLINICAL DATA:  Recent cholecystectomy.  Elevated liver enzymes EXAM: ULTRASOUND ABDOMEN LIMITED RIGHT UPPER QUADRANT COMPARISON:  CT abdomen and pelvis Apr 19, 2019 FINDINGS: Gallbladder: Surgically absent. No lesions seen in the gallbladder fossa by ultrasound. Common bile duct: Diameter: 10 mm which is upper normal in size for post cholecystectomy state. No appreciable intrahepatic biliary duct dilatation evident. Note that portions of the common bile duct are obscured by gas. Liver: Echogenic foci which shadow are noted in the liver, likely with in intrahepatic ducts. Appearance suggests air in these areas. No liver mass lesions are evident. Within normal limits in parenchymal echogenicity. Portal vein is patent on color Doppler imaging with normal direction of blood flow towards the liver. There is a small right pleural effusion. IMPRESSION: 1. Suspect foci of air within intrahepatic biliary ducts. This finding raises concern for potential degree of cholangitis. The common bile duct is upper normal in size for post cholecystectomy state. Note that portions of the common bile duct are not seen. Given these concerns, CT or MR, ideally with intravenous contrast, advised to further assess. 2. Gallbladder absent. No lesions seen by ultrasound in the gallbladder fossa region. 3.  Small right pleural effusion. These results will be called to the ordering clinician or representative by the Radiologist Assistant, and communication documented in the PACS or zVision Dashboard. Electronically Signed   By: Lowella Grip III M.D.   On: 05/15/2019 09:15    ROS:  Pertinent items are noted in HPI.  Blood pressure (!)  147/75, pulse 87, temperature 98.2 F (36.8 C), temperature source Oral, resp. rate 20, height 5\' 6"  (1.676 m), weight 92.1 kg, SpO2 96 %. Physical Exam: Pleasant black male no acute distress Abdomen is soft, nontender, nondistended.  A small portion of  the right upper quadrant incision has a minimal superficial dehiscence for which Steri-Strips are in place.  No hematoma is present.  No erythema is noted.  CT scan images personally reviewed  Assessment/Plan: Impression: Status post open cholecystectomy, partial colectomy.  A fluid collection is present and this may be a postoperative hematoma that is secondarily infected.  Patient is to be admitted by the hospitalist service.  Would start Zosyn IV.  Will recommend interventional radiologic drainage of the abscess tomorrow.  Will follow with you.  May have a diet until midnight tonight, then he should be n.p.o. blood pressure stable but would give IV fluid boluses to correct lactic acidosis.  Aviva Signs 05/15/2019, 3:56 PM

## 2019-05-15 NOTE — Telephone Encounter (Signed)
[facility] CALLED AND STATED THAT HE WAS GOING TO THE ER AT [place] BECAUSE OF HIS ULTRASOUND RESULTS, SAID THEY MAY CANCEL HIS APPOINTMENT FOR TOMORROW IF THEY ADMIT HIM.

## 2019-05-15 NOTE — ED Notes (Signed)
Dr. [name] in room to see pt

## 2019-05-15 NOTE — ED Provider Notes (Signed)
Medical screening examination/treatment/procedure(s) were conducted as a shared visit with non-physician practitioner(s) and myself.  I personally evaluated the patient during the encounter.      Patient seen by me along with physician assistant.  Patient status post cholecystectomy and partial colon resection.  Done by Dr. [name].  CT scan done here today with contrast shows evidence of abscess in the gallbladder fossa.  No evidence of any cholangitis.  Also there is an area in the proximal ascending colon concerning for abscess.  Patient will be admitted by hospitalist.  Dr. [name] aware.  Patient will probably have a IR intervention to drain the abscesses.  Patient currently nontoxic no acute distress.  Patient alert.  Abdomen without significant findings on exam.   Fredia Sorrow, MD 05/15/19 1336

## 2019-05-15 NOTE — ED Notes (Signed)
Pt states he has been incontinent of urine and bowels since his surgery

## 2019-05-15 NOTE — ED Notes (Signed)
Pt returned from CT °

## 2019-05-15 NOTE — Progress Notes (Signed)
IR requested by Dr. [name] for possible image-guided intraabdominal abscess drain(s) placement.  Dr. Pascal Lux reviewed images and states that abscess(es) are too small and not amenable to percutaneous drainage at this time. Recommend continued IV antibiotics and possible repeat CT if patient does not improve. Dr. Pascal Lux spoke with Dr. Rogene Houston and informed on above.  IR available in future if needed.   Bea Graff Louk, PA-C 05/15/2019, 4:43 PM

## 2019-05-15 NOTE — Telephone Encounter (Signed)
Noted  

## 2019-05-15 NOTE — ED Notes (Signed)
CRITICAL VALUE ALERT  Critical Value:  Lactic acid 2.1  Date & Time Notied:  05/15/19 1348  Provider Notified: Rubye Oaks, PA  Orders Received/Actions taken: na

## 2019-05-15 NOTE — H&P (Addendum)
History and Physical    Stephen CAPELL Sr. NUU:[id number] DOB: 1941/07/25 DOA: 05/15/2019  PCP: [name], MD   Patient coming from: [facility]  I have personally briefly reviewed patient's old medical records in [place]  Chief Complaint: Abnormal Labs and imaging  HPI: Stephen DURBIN Sr. is a 78 y.o. male with medical history significant for DM2, HTN, CKD3, GI bleed, DVT- 2017, who was sent to the ED from nursing home with reports of abnormal ultrasound and elevated liver enzymes.   Patient was recently hospitalized 5/27 to 6/11 for rectal bleed requiring 5 units of PRBC and Chole- colo fistula, requiring open cholecystectomy and partial colectomy 01/24/7424, complicated by acute kidney injury and left knee swelling requiring arthrocentesis performed 6/7 with findings suggestive of inflammatory osteoarthritis.   Per chart patient has had nausea since discharge.  Patient to me denies nausea but reports he has been on a liquid diet since hospitalization and now he does not like the taste of solid diet.  He has had ~2 loose stools daily since discharge, no blood, no black stools.  No abdominal pain.  No reported fevers or chills. Also reports generalized weakness since discharge, that seems to be improving, as yesterday he was able to ambulate at the nursing home, ambulation mostly limited by pain in his left ankle-arthritis.  He reports urinary and bowel incontinence also since discharge.  No abnormal sensation in his lower extremities no back pain.  On follow-up with his outpatient provider, liver enzymes were elevated with leukocytosis, patient had a RUQ abdominal ultrasound today-suspected foci of air within intrahepatic biliary duct, with concern for cholangitis.  CT or MRI with contrast recommended.  ED Course: Afebrile 98.2, intermittent tachycardia to 956, systolic blood pressure 387-564P.  Lactic acid 2.1 >> 1.9.  WBC 17.6.  Elevated liver enzymes AST 92, ALT 149, normal  ALP 108 and normal total bilirubin 0.5.  1 L bolus given in ED. T abdomen and pelvis with contrast-interval cholecystectomy.  3.5 x 4.6cm complex fluid collection in gallbladder fossa concerning for abscess.  No CT evidence of cholangitis.  Findings also suggests element of gastric outlet obstruction.,  Mild colitis, 2.8cm fluid collection anterior mesenteric aspect of proximal ascending colon along continue abdominal wall most consistent with abscess. IV Zosyn started in ED. EDP talked to Dr. [name] who recommended possible IR drainage in a.m. EDP talked to IR, Dr. Pascal Lux, (see brief note in chart)-who recommends antibiotics at this time, as abscesses are too small and not amenable to percutaneous drainage at this time.  Repeat CT recommended if patient does not improve.  Review of Systems: As per HPI all other systems reviewed and negative.  Past Medical History:  Diagnosis Date   CKD (chronic kidney disease), stage III ([place])    Diabetes mellitus    DVT (deep venous thrombosis) ([place]) 10/14/2016   Extremity edema 09/2016   Hyperlipemia    Hypertension     Past Surgical History:  Procedure Laterality Date   CHOLECYSTECTOMY N/A 04/23/2019   Procedure: CHOLECYSTECTOMY;  Surgeon: Aviva Signs, MD;  Location: [facility];  Service: General;  Laterality: N/A;   COLONOSCOPY  02/2010   Dr. [name]: diverticuolosis, multiple polyps removed, cecal adenoma.    COLONOSCOPY WITH PROPOFOL N/A 04/19/2019   Procedure: COLONOSCOPY WITH PROPOFOL;  Surgeon: [name], MD;  Location: [facility];  Service: Endoscopy;  Laterality: N/A;   IR GENERIC HISTORICAL  10/19/2016   IR PERC CHOLECYSTOSTOMY 10/19/2016 [name], MD  [facility]   IR GENERIC HISTORICAL  10/25/2016   IR CATHETER TUBE CHANGE 10/25/2016 [name], MD [facility]   IR GENERIC HISTORICAL  11/30/2016   IR RADIOLOGIST EVAL & MGMT 11/30/2016 [facility]   PARTIAL COLECTOMY N/A 04/23/2019   Procedure: PARTIAL COLECTOMY;   Surgeon: Aviva Signs, MD;  Location: [facility];  Service: General;  Laterality: N/A;   POLYPECTOMY  04/19/2019   Procedure: POLYPECTOMY;  Surgeon: [name], MD;  Location: [facility];  Service: Endoscopy;;  polyp at recto sigmoid     reports that he has never smoked. He has never used smokeless tobacco. He reports that he does not drink alcohol or use drugs.  No Known Allergies  Family History  Problem Relation Age of Onset   Heart attack Other    Tuberculosis Other    Tuberculosis Other    Diabetes type II Father    Hypertension Father    Heart attack Maternal Aunt     Prior to Admission medications   Medication Sig Start Date End Date Taking? Authorizing Provider  acetaminophen (TYLENOL) 325 MG tablet Take 2 tablets (650 mg total) by mouth every 6 (six) hours as needed for mild pain, fever or headache (or Fever >/= 101). 05/03/19  Yes Laurianne Floresca, Courage, MD  Amino Acids-Protein Hydrolys (FEEDING SUPPLEMENT, PRO-STAT SUGAR FREE 64,) LIQD Take 30 mLs by mouth 2 (two) times daily between meals. 05/08/19  Yes [provider]  aspirin EC 81 MG tablet Take 81 mg by mouth daily.   Yes [provider]  atorvastatin (LIPITOR) 40 MG tablet Take 40 mg by mouth daily.   Yes [provider]  furosemide (LASIX) 20 MG tablet Take 1 tablet (20 mg total) by mouth daily with breakfast. 05/03/19  Yes Trashawn Oquendo, Courage, MD  hydrALAZINE (APRESOLINE) 100 MG tablet Take 0.5 tablets (50 mg total) by mouth 3 (three) times daily. 05/03/19  Yes Rafiq Bucklin, Courage, MD  lisinopril (ZESTRIL) 40 MG tablet Take 0.5 tablets (20 mg total) by mouth daily. 05/03/19  Yes Hephzibah Strehle, Courage, MD  metFORMIN (GLUCOPHAGE) 500 MG tablet Take 500 mg by mouth 2 (two) times a day. 02/28/19  Yes [provider]  NON FORMULARY Diet Type:  NAS, Consistent Carbohydrate, bland diet 05/04/19  Yes [provider]  Nutritional Supplements (ENSURE CLEAR) LIQD Take 1 Bottle by mouth 2 (two)  times daily between meals. 05/08/19  Yes [provider]  Nutritional Supplements (ENSURE ENLIVE PO) Take 1 Bottle by mouth daily. 05/04/19  Yes [provider]  ondansetron (ZOFRAN-ODT) 4 MG disintegrating tablet Take 1 tablet (4 mg total) by mouth every 6 (six) hours as needed for nausea. 05/03/19  Yes Darly Massi, Courage, MD  pantoprazole (PROTONIX) 40 MG tablet Take 1 tablet (40 mg total) by mouth daily. 05/04/19  Yes Elienai Gailey, Courage, MD  spironolactone (ALDACTONE) 25 MG tablet Take 25 mg by mouth daily.   Yes [provider]  verapamil (CALAN-SR) 120 MG CR tablet Take 1 tablet (120 mg total) by mouth daily. 05/03/19  Yes Roxan Hockey, MD    Physical Exam: Vitals:   05/15/19 1345 05/15/19 1400 05/15/19 1445 05/15/19 1630  BP:  (!) 147/75    Pulse: 95 (!) 101 87 93  Resp:      Temp:      TempSrc:      SpO2: 96% 96% 96% 99%  Weight:      Height:        Constitutional: NAD, calm, comfortable Vitals:  05/15/19 1345 05/15/19 1400 05/15/19 1445 05/15/19 1630  BP:  (!) 147/75    Pulse: 95 (!) 101 87 93  Resp:      Temp:      TempSrc:      SpO2: 96% 96% 96% 99%  Weight:      Height:       Eyes: PERRL, lids and conjunctivae normal ENMT: Mucous membranes are moist. Posterior pharynx clear of any exudate or lesions. Neck: normal, supple, no masses, no thyromegaly Respiratory: clear to auscultation bilaterally, no wheezing, no crackles. Normal respiratory effort. No accessory muscle use.  Cardiovascular: Regular rate and rhythm, no murmurs / rubs / gallops. No extremity edema. 2+ pedal pulses.   Abdomen: healing surgical incision to right upper quadrant, without surrounding erythema or drainage, no tenderness, no masses palpated. No hepatosplenomegaly. Bowel sounds positive.  Musculoskeletal: no clubbing / cyanosis.  Left knee slightly more swollen than right, slight differential warmth without erythema, effusion not appreciated.  Good ROM, no contractures.  Normal muscle tone.  Skin: no rashes, lesions, ulcers. No induration Neurologic: CN 2-12 grossly intact.  Strength 4+/5 in bilateral lower extremities, 5/5 bilateral upper extremities.  Normal sensation globally. Psychiatric: Normal judgment and insight. Alert and oriented x 3. Normal mood.   Labs on Admission: I have personally reviewed following labs and imaging studies  CBC: Recent Labs  Lab 05/14/19 0831 05/15/19 1011  WBC 14.4* 17.6*  NEUTROABS  --  11.9*  HGB 8.7* 9.4*  HCT 28.2* 30.3*  MCV 92.5 92.4  PLT 749* 831*   Basic Metabolic Panel: Recent Labs  Lab 05/14/19 0831 05/15/19 1011  NA 138 138  K 4.6 4.6  CL 100 100  CO2 25 23  GLUCOSE 195* 220*  BUN 26* 29*  CREATININE 1.41* 1.38*  CALCIUM 8.0* 8.4*   Liver Function Tests: Recent Labs  Lab 05/14/19 0831 05/15/19 1011  AST 107* 92*  ALT 159* 149*  ALKPHOS 101 108  BILITOT 0.2* 0.5  PROT 6.5 7.4  ALBUMIN 2.2* 2.7*   Coagulation Profile: Recent Labs  Lab 05/15/19 1011  INR 1.1   Radiological Exams on Admission: Ct Abdomen Pelvis W Contrast  Result Date: 05/15/2019 CLINICAL DATA:  Diarrhea, no pain EXAM: CT ABDOMEN AND PELVIS WITH CONTRAST TECHNIQUE: Multidetector CT imaging of the abdomen and pelvis was performed using the standard protocol following bolus administration of intravenous contrast. CONTRAST:  180mL OMNIPAQUE IOHEXOL 300 MG/ML  SOLN COMPARISON:  04/19/2019 FINDINGS: Lower chest: Small right pleural effusion. Trace left pleural effusion. Right basilar atelectasis. Right gynecomastia again noted. Hepatobiliary: No focal hepatic mass. No intrahepatic fluid collection. No interval change in minimal intrahepatic biliary duct dilatation centrally. Interval cholecystectomy. 3.5 x 4.6 cm complex fluid collection in the gallbladder fossa with an air-fluid level concerning for an abscess. Pancreas: Unremarkable. No pancreatic ductal dilatation or surrounding inflammatory changes. Spleen: Normal in size  without focal abnormality. Adrenals/Urinary Tract: Normal adrenal glands. Stable 2 cm left renal cyst. Kidneys otherwise normal. No urolithiasis or obstructive uropathy. Normal bladder. Stomach/Bowel: No bowel dilatation to suggest bowel obstruction. Gastric air-fluid level with wall thickening of the antrum and proximal duodenum which may reflect an element of gastric outlet obstruction. Bowel wall thickening and surrounding inflammatory changes around the proximal ascending colon concerning for mild colitis. Normal appendix. 2.8 cm fluid collection along the anti mesenteric aspect of the proximal ascending colon along the anterior abdominal wall most consistent with an abscess. Vascular/Lymphatic: Normal caliber abdominal aorta with mild atherosclerosis. No lymphadenopathy.  Reproductive: Prostate is unremarkable. Other: No abdominal wall hernia or abnormality. No abdominopelvic ascites. Postsurgical changes in the subcutaneous fat of the right anterior abdominal wall. Musculoskeletal: No acute osseous abnormality. No aggressive osseous lesion. Degenerative disease with disc height loss at L2-3, L3-4 L4-5, L5-S1. IMPRESSION: 1. Interval cholecystectomy. 3.5 x 4.6 cm complex fluid collection in the gallbladder fossa with an air-fluid level concerning for an abscess. No CT evidence of cholangitis. 2. Gastric air-fluid level with reactive wall thickening of the antrum and proximal duodenum which may reflect an element of gastric outlet obstruction. Bowel wall thickening and surrounding inflammatory changes around the proximal ascending colon concerning for mild colitis. 2.8 cm fluid collection along the anti mesenteric aspect of the proximal ascending colon along the anterior abdominal wall most consistent with an abscess. 3. Small bilateral pleural effusions. Electronically Signed   By: Kathreen Devoid   On: 05/15/2019 12:16   US Abdomen Limited Ruq  Result Date: 05/15/2019 CLINICAL DATA:  Recent cholecystectomy.   Elevated liver enzymes EXAM: ULTRASOUND ABDOMEN LIMITED RIGHT UPPER QUADRANT COMPARISON:  CT abdomen and pelvis Apr 19, 2019 FINDINGS: Gallbladder: Surgically absent. No lesions seen in the gallbladder fossa by ultrasound. Common bile duct: Diameter: 10 mm which is upper normal in size for post cholecystectomy state. No appreciable intrahepatic biliary duct dilatation evident. Note that portions of the common bile duct are obscured by gas. Liver: Echogenic foci which shadow are noted in the liver, likely with in intrahepatic ducts. Appearance suggests air in these areas. No liver mass lesions are evident. Within normal limits in parenchymal echogenicity. Portal vein is patent on color Doppler imaging with normal direction of blood flow towards the liver. There is a small right pleural effusion. IMPRESSION: 1. Suspect foci of air within intrahepatic biliary ducts. This finding raises concern for potential degree of cholangitis. The common bile duct is upper normal in size for post cholecystectomy state. Note that portions of the common bile duct are not seen. Given these concerns, CT or MR, ideally with intravenous contrast, advised to further assess. 2. Gallbladder absent. No lesions seen by ultrasound in the gallbladder fossa region. 3.  Small right pleural effusion. These results will be called to the ordering clinician or representative by the Radiologist Assistant, and communication documented in the PACS or zVision Dashboard. Electronically Signed   By: Lowella Grip III M.D.   On: 05/15/2019 09:15    EKG: None.  Assessment/Plan Active Problems:   Intra-abdominal abscess (HCC)    Intra-abdominal abscesses- leukocytosis 17, initial tachycardia to 122, lactic acid 2.1 >> 1.9.  Initially rules in for sepsis (early) with mild lactic acidosis.  With transaminitis- AST 92, ALT 149, but ALP and bilirubin within normal limits 108 and 0.5 respectively.  Abd CT suggest-3.5 x 4.6 abscess in gallbladder  fossa, possible element of gastric outlet obstruction, mild colitis, 2.8cm abscess proximal ascending colon along the anterior abdominal wall. -Continue IV Zosyn -Follow-up blood cultures -[facility] consulted-recs appreciated, IV antibiotics consult IR for possible drainage in a.m. - IR consulted by EDP-abscess too small, not amenable to percutaneous drainage at this time.  If patient does not improve consider repeat CT, IV antibiotics for now. - CBC, CMP a.m - IVF N/s 75cc/hr x 1day -Continue full liquid diet-patient specifically requests this, N.p.o. midnight.  Recent GI bleed-hemoglobin stable at 9.4, no subsequent bloody stools since discharge.  Required 5u PRBCs.  GI bleed thought secondary to Chole-Colo fistula, as suspected on colonoscopy by Dr. Buford Dresser and confirmed on abdominal  CT.  Requiring subsequent open cholecystectomy and partial colectomy 04/23/2019. -CBC a.m.  Hypertension-stable. -Continue home hydralazine, verapamil -Hold home lisinopril, low-dose Lasix 20 mg, spironolactone for recent contrast exposure with CKD.  Diabetes mellitus-random glucose 220, Home medications Metformin. - SSi- S -Hold home metformin  CKD 3-stable creatinine 1.38.  Recent admission complicated by AKI, Creatinine continues to improve.  He takes low-dose Lasix 20 mg daily for intermittent lower extremity swelling.  Left knee pain-mild swelling differential warmth on exam.  Required arthrocentesis on recent hospitalization 6/7, with findings suggestive of inflammatory osteoarthritis but no significant septic arthritis or gout.   DVT prophylaxis: SCDs for now- healing abdominal sutures, and possible procedure. Code Status: Full Family Communication: None at bedside  Disposition Plan: Per rounding team Consults called: Gen Surg, IR Admission status: Inpt, tele I certify that at the point of admission it is my clinical judgment that the patient will require inpatient hospital care spanning beyond  2 midnights from the point of admission due to high intensity of service, high risk for further deterioration and high frequency of surveillance required. The following factors support the patient status of inpatient: Intra-abdominal abscesses, meeting sepsis criteria, requiring IV antibiotics.   Bethena Roys MD [facility]  05/15/2019, 4:42 PM

## 2019-05-15 NOTE — Progress Notes (Signed)
Pharmacy Antibiotic Note  Stephen GENTHER Sr. is a 78 y.o. male admitted on 05/15/2019 with intra abdominal infection.  Pharmacy has been consulted for zosyn dosing.  Plan: Zosyn 3.375g IV q8h (4 hour infusion).  Height: 5\' 6"  (167.6 cm) Weight: 203 lb (92.1 kg) IBW/kg (Calculated) : 63.8  Temp (24hrs), Avg:97.9 F (36.6 C), Min:97.6 F (36.4 C), Max:98.2 F (36.8 C)  Recent Labs  Lab 05/14/19 0831 05/15/19 1011 05/15/19 1305 05/15/19 1457  WBC 14.4* 17.6*  --   --   CREATININE 1.41* 1.38*  --   --   LATICACIDVEN  --   --  2.1* 1.9    Estimated Creatinine Clearance: 46.1 mL/min (A) (by C-G formula based on SCr of 1.38 mg/dL (H)).    No Known Allergies  Antimicrobials this admission: 6/23 zosyn >>   Microbiology results: 6/23 BCx: sent 6/23 Covid 19 negative   Thank you for allowing pharmacy to be a part of this patient's care.  Donna Christen Revin Corker 05/15/2019 3:54 PM

## 2019-05-16 ENCOUNTER — Ambulatory Visit: Payer: Medicare HMO | Admitting: Gastroenterology

## 2019-05-16 LAB — GLUCOSE, CAPILLARY
Glucose-Capillary: 149 mg/dL — ABNORMAL HIGH (ref 70–99)
Glucose-Capillary: 197 mg/dL — ABNORMAL HIGH (ref 70–99)
Glucose-Capillary: 199 mg/dL — ABNORMAL HIGH (ref 70–99)
Glucose-Capillary: 214 mg/dL — ABNORMAL HIGH (ref 70–99)

## 2019-05-16 LAB — CBC
HCT: 28.7 % — ABNORMAL LOW (ref 39.0–52.0)
Hemoglobin: 8.7 g/dL — ABNORMAL LOW (ref 13.0–17.0)
MCH: 28.2 pg (ref 26.0–34.0)
MCHC: 30.3 g/dL (ref 30.0–36.0)
MCV: 92.9 fL (ref 80.0–100.0)
Platelets: 648 10*3/uL — ABNORMAL HIGH (ref 150–400)
RBC: 3.09 MIL/uL — ABNORMAL LOW (ref 4.22–5.81)
RDW: 14.6 % (ref 11.5–15.5)
WBC: 16.6 10*3/uL — ABNORMAL HIGH (ref 4.0–10.5)
nRBC: 0 % (ref 0.0–0.2)

## 2019-05-16 LAB — BLOOD CULTURE ID PANEL (REFLEXED)

## 2019-05-16 LAB — COMPREHENSIVE METABOLIC PANEL
ALT: 111 U/L — ABNORMAL HIGH (ref 0–44)
AST: 68 U/L — ABNORMAL HIGH (ref 15–41)
Albumin: 2.3 g/dL — ABNORMAL LOW (ref 3.5–5.0)
Alkaline Phosphatase: 94 U/L (ref 38–126)
Anion gap: 9 (ref 5–15)
BUN: 21 mg/dL (ref 8–23)
CO2: 27 mmol/L (ref 22–32)
Calcium: 8.2 mg/dL — ABNORMAL LOW (ref 8.9–10.3)
Chloride: 104 mmol/L (ref 98–111)
Creatinine, Ser: 1.4 mg/dL — ABNORMAL HIGH (ref 0.61–1.24)
GFR calc Af Amer: 55 mL/min — ABNORMAL LOW (ref >60)
GFR calc non Af Amer: 47 mL/min — ABNORMAL LOW (ref >60)
Glucose, Bld: 168 mg/dL — ABNORMAL HIGH (ref 70–99)
Potassium: 4.4 mmol/L (ref 3.5–5.1)
Sodium: 140 mmol/L (ref 135–145)
Total Bilirubin: 0.4 mg/dL (ref 0.3–1.2)
Total Protein: 6.3 g/dL — ABNORMAL LOW (ref 6.5–8.1)

## 2019-05-16 MED ORDER — ENSURE ENLIVE PO LIQD
237.0000 mL | Freq: Two times a day (BID) | ORAL | Status: DC
  Administered 2019-05-16 – 2019-05-21 (×6): 237 mL via ORAL

## 2019-05-16 MED ORDER — ENSURE SURGERY PO LIQD
237.0000 mL | Freq: Two times a day (BID) | ORAL | Status: DC
  Filled 2019-05-16 (×4): qty 237

## 2019-05-16 MED ORDER — LABETALOL HCL 5 MG/ML IV SOLN: 10.0000 mg | INTRAVENOUS | Status: DC | PRN

## 2019-05-16 MED ORDER — HEPARIN SODIUM (PORCINE) 5000 UNIT/ML IJ SOLN
5000.0000 [IU] | Freq: Three times a day (TID) | INTRAMUSCULAR | Status: DC
  Administered 2019-05-16 – 2019-05-18 (×6): 5000 [IU] via SUBCUTANEOUS
  Filled 2019-05-16 (×6): qty 1

## 2019-05-16 NOTE — Plan of Care (Signed)
  Problem: Acute Rehab PT Goals(only PT should resolve) Goal: Pt Will Go Supine/Side To Sit Outcome: Progressing Flowsheets (Taken 05/16/2019 0921) Pt will go Supine/Side to Sit: with supervision Goal: Patient Will Transfer Sit To/From Stand Outcome: Progressing Flowsheets (Taken 05/16/2019 0921) Patient will transfer sit to/from stand: with minimal assist Goal: Pt Will Transfer Bed To Chair/Chair To Bed Outcome: Progressing Flowsheets (Taken 05/16/2019 0921) Pt will Transfer Bed to Chair/Chair to Bed:  min guard assist  with min assist Goal: Pt Will Ambulate Outcome: Progressing Flowsheets (Taken 05/16/2019 0921) Pt will Ambulate:  25 feet  with minimal assist  with rolling walker   9:22 AM, 05/16/19 Lonell Grandchild, MPT Physical Therapist with [facility] [phone] office [phone] mobile phone

## 2019-05-16 NOTE — Care Management Important Message (Signed)
Important Message  Patient Details  Name: Stephen KENNETH Sr. MRN: [id number] Date of Birth: 07-07-1941   Medicare Important Message Given:  Yes     Tommy Medal 05/16/2019, 1:27 PM

## 2019-05-16 NOTE — Evaluation (Signed)
Physical Therapy Evaluation Patient Details Name: Stephen DONAWAY Sr. MRN: [id number] DOB: 05/30/1941 Today's Date: 05/16/2019   History of Present Illness  [name] is a 78 y.o. male with medical history significant for DM2, HTN, CKD3, GI bleed, DVT- 2017, who was sent to the ED from nursing home with reports of abnormal ultrasound and elevated liver enzymes.  Patient was recently hospitalized 5/27 to 6/11 for rectal bleed requiring 5 units of PRBC and Chole- colo fistula, requiring open cholecystectomy and partial colectomy 06/29/8675, complicated by acute kidney injury and left knee swelling requiring arthrocentesis performed 6/7 with findings suggestive of inflammatory osteoarthritis.  Per chart patient has had nausea since discharge.  Patient to me denies nausea but reports he has been on a liquid diet since hospitalization and now he does not like the taste of solid diet.  He has had ~2 loose stools daily since discharge, no blood, no black stools.  No abdominal pain.  No reported fevers or chills.Also reports generalized weakness since discharge, that seems to be improving, as yesterday he was able to ambulate at the nursing home, ambulation mostly limited by pain in his left ankle-arthritis.  He reports urinary and bowel incontinence also since discharge.  No abnormal sensation in his lower extremities no back pain.    Clinical Impression  Patient limited for functional mobility as stated below secondary to BLE weakness, fatigue and poor standing balance.  Patient limited to a few steps at bedside, able to transfer to chair and tolerated sitting up in chair after therapy - RN aware.    Patient will benefit from continued physical therapy in hospital and recommended venue below to increase strength, balance, endurance for safe ADLs and gait.     Follow Up Recommendations SNF    Equipment Recommendations  None recommended by PT    Recommendations for Other Services        Precautions / Restrictions Precautions Precautions: Fall Restrictions Weight Bearing Restrictions: No      Mobility  Bed Mobility Overal bed mobility: Needs Assistance Bed Mobility: Supine to Sit Rolling: Min guard;Supervision         General bed mobility comments: increased time, slightly labored movement  Transfers Overall transfer level: Needs assistance Equipment used: Rolling walker (2 wheeled) Transfers: Sit to/from [facility] Sit to Stand: Mod assist Stand pivot transfers: Min assist;Mod assist       General transfer comment: had difficulty for sit to stands due to BLE weakness and verbal/tactile cueing for proper hand placement  Ambulation/Gait Ambulation/Gait assistance: Mod assist Gait Distance (Feet): 5 Feet Assistive device: Rolling walker (2 wheeled) Gait Pattern/deviations: Decreased step length - right;Decreased step length - left;Decreased stride length Gait velocity: slow   General Gait Details: limited to 5-6 slow labored unsteady steps at bedside due to generalized weakness and fatigue  Stairs            Wheelchair Mobility    Modified Rankin (Stroke Patients Only)       Balance Overall balance assessment: Needs assistance Sitting-balance support: Feet supported;No upper extremity supported Sitting balance-Leahy Scale: Fair Sitting balance - Comments: fair/good seated at bedside   Standing balance support: During functional activity;Bilateral upper extremity supported Standing balance-Leahy Scale: Fair Standing balance comment: using RW                             Pertinent Vitals/Pain Pain Assessment: No/denies pain    Home Living  Family/patient expects to be discharged to:: Private residence Living Arrangements: Children Available Help at Discharge: Family;Available 24 hours/day Type of Home: House Home Access: Level entry     Home Layout: Two level;Able to live on main level with  bedroom/bathroom Home Equipment: Gilford Rile - 2 wheels;Cane - single point;Bedside commode;Shower seat      Prior Function Level of Independence: Needs assistance   Gait / Transfers Assistance Needed: Household ambulator without AD  ADL's / Homemaking Assistance Needed: assisted by family for community ADL's        Hand Dominance        Extremity/Trunk Assessment   Upper Extremity Assessment Upper Extremity Assessment: Generalized weakness    Lower Extremity Assessment Lower Extremity Assessment: Generalized weakness    Cervical / Trunk Assessment Cervical / Trunk Assessment: Kyphotic  Communication   Communication: No difficulties  Cognition Arousal/Alertness: Awake/alert Behavior During Therapy: WFL for tasks assessed/performed Overall Cognitive Status: Within Functional Limits for tasks assessed                                        General Comments      Exercises     Assessment/Plan    PT Assessment Patient needs continued PT services  PT Problem List Decreased strength;Decreased activity tolerance;Decreased balance;Decreased mobility       PT Treatment Interventions Gait training;Stair training;Functional mobility training;Therapeutic activities;Therapeutic exercise;Patient/family education    PT Goals (Current goals can be found in the Care Plan section)  Acute Rehab PT Goals Patient Stated Goal: return home after rehab PT Goal Formulation: With patient Time For Goal Achievement: 05/30/19 Potential to Achieve Goals: Good    Frequency Min 3X/week   Barriers to discharge        Co-evaluation               AM-PAC PT "6 Clicks" Mobility  Outcome Measure Help needed turning from your back to your side while in a flat bed without using bedrails?: None Help needed moving from lying on your back to sitting on the side of a flat bed without using bedrails?: A Little Help needed moving to and from a bed to a chair (including a  wheelchair)?: A Lot Help needed standing up from a chair using your arms (e.g., wheelchair or bedside chair)?: A Lot Help needed to walk in hospital room?: A Lot Help needed climbing 3-5 steps with a railing? : A Lot 6 Click Score: 15    End of Session   Activity Tolerance: Patient tolerated treatment well;Patient limited by fatigue Patient left: in chair;with call bell/phone within reach Nurse Communication: Mobility status PT Visit Diagnosis: Unsteadiness on feet (R26.81);Other abnormalities of gait and mobility (R26.89);Muscle weakness (generalized) (M62.81)    Time: 3845-3646 PT Time Calculation (min) (ACUTE ONLY): 25 min   Charges:   PT Evaluation $PT Eval Moderate Complexity: 1 Mod PT Treatments $Therapeutic Activity: 23-37 mins        9:20 AM, 05/16/19 Lonell Grandchild, MPT Physical Therapist with [facility] [phone] office [phone] mobile phone

## 2019-05-16 NOTE — Progress Notes (Signed)
Subjective: Patient has no complaints.  Objective: Vital signs in last 24 hours: Temp:  [97.6 F (36.4 C)-98.9 F (37.2 C)] 98.9 F (37.2 C) (06/24 0511) Pulse Rate:  [87-122] 100 (06/24 0511) Resp:  [18-20] 20 (06/24 0511) BP: (114-168)/(56-93) 123/75 (06/24 0511) SpO2:  [92 %-100 %] 98 % (06/24 0511) Weight:  [89.9 kg-92.1 kg] 90.4 kg (06/23 2131) Last BM Date: 05/15/19  Intake/Output from previous day: 06/23 0701 - 06/24 0700 In: 1288.9 [I.V.:196.3; IV Piggyback:1092.6] Out: 800 [Urine:800] Intake/Output this shift: No intake/output data recorded.  General appearance: alert, cooperative and no distress GI: soft, non-tender; bowel sounds normal; no masses,  no organomegaly and Incision without drainage or erythema.  Lab Results:  Recent Labs    05/15/19 1011 05/16/19 0603  WBC 17.6* 16.6*  HGB 9.4* 8.7*  HCT 30.3* 28.7*  PLT 797* 648*   BMET Recent Labs    05/15/19 1011 05/16/19 0603  NA 138 140  K 4.6 4.4  CL 100 104  CO2 23 27  GLUCOSE 220* 168*  BUN 29* 21  CREATININE 1.38* 1.40*  CALCIUM 8.4* 8.2*   PT/INR Recent Labs    05/15/19 1011  LABPROT 14.0  INR 1.1    Studies/Results: Ct Abdomen Pelvis W Contrast  Result Date: 05/15/2019 CLINICAL DATA:  Diarrhea, no pain EXAM: CT ABDOMEN AND PELVIS WITH CONTRAST TECHNIQUE: Multidetector CT imaging of the abdomen and pelvis was performed using the standard protocol following bolus administration of intravenous contrast. CONTRAST:  166mL OMNIPAQUE IOHEXOL 300 MG/ML  SOLN COMPARISON:  04/19/2019 FINDINGS: Lower chest: Small right pleural effusion. Trace left pleural effusion. Right basilar atelectasis. Right gynecomastia again noted. Hepatobiliary: No focal hepatic mass. No intrahepatic fluid collection. No interval change in minimal intrahepatic biliary duct dilatation centrally. Interval cholecystectomy. 3.5 x 4.6 cm complex fluid collection in the gallbladder fossa with an air-fluid level concerning for  an abscess. Pancreas: Unremarkable. No pancreatic ductal dilatation or surrounding inflammatory changes. Spleen: Normal in size without focal abnormality. Adrenals/Urinary Tract: Normal adrenal glands. Stable 2 cm left renal cyst. Kidneys otherwise normal. No urolithiasis or obstructive uropathy. Normal bladder. Stomach/Bowel: No bowel dilatation to suggest bowel obstruction. Gastric air-fluid level with wall thickening of the antrum and proximal duodenum which may reflect an element of gastric outlet obstruction. Bowel wall thickening and surrounding inflammatory changes around the proximal ascending colon concerning for mild colitis. Normal appendix. 2.8 cm fluid collection along the anti mesenteric aspect of the proximal ascending colon along the anterior abdominal wall most consistent with an abscess. Vascular/Lymphatic: Normal caliber abdominal aorta with mild atherosclerosis. No lymphadenopathy. Reproductive: Prostate is unremarkable. Other: No abdominal wall hernia or abnormality. No abdominopelvic ascites. Postsurgical changes in the subcutaneous fat of the right anterior abdominal wall. Musculoskeletal: No acute osseous abnormality. No aggressive osseous lesion. Degenerative disease with disc height loss at L2-3, L3-4 L4-5, L5-S1. IMPRESSION: 1. Interval cholecystectomy. 3.5 x 4.6 cm complex fluid collection in the gallbladder fossa with an air-fluid level concerning for an abscess. No CT evidence of cholangitis. 2. Gastric air-fluid level with reactive wall thickening of the antrum and proximal duodenum which may reflect an element of gastric outlet obstruction. Bowel wall thickening and surrounding inflammatory changes around the proximal ascending colon concerning for mild colitis. 2.8 cm fluid collection along the anti mesenteric aspect of the proximal ascending colon along the anterior abdominal wall most consistent with an abscess. 3. Small bilateral pleural effusions. Electronically Signed   By:  Kathreen Devoid   On: 05/15/2019 12:16  US Abdomen Limited Ruq  Result Date: 05/15/2019 CLINICAL DATA:  Recent cholecystectomy.  Elevated liver enzymes EXAM: ULTRASOUND ABDOMEN LIMITED RIGHT UPPER QUADRANT COMPARISON:  CT abdomen and pelvis Apr 19, 2019 FINDINGS: Gallbladder: Surgically absent. No lesions seen in the gallbladder fossa by ultrasound. Common bile duct: Diameter: 10 mm which is upper normal in size for post cholecystectomy state. No appreciable intrahepatic biliary duct dilatation evident. Note that portions of the common bile duct are obscured by gas. Liver: Echogenic foci which shadow are noted in the liver, likely with in intrahepatic ducts. Appearance suggests air in these areas. No liver mass lesions are evident. Within normal limits in parenchymal echogenicity. Portal vein is patent on color Doppler imaging with normal direction of blood flow towards the liver. There is a small right pleural effusion. IMPRESSION: 1. Suspect foci of air within intrahepatic biliary ducts. This finding raises concern for potential degree of cholangitis. The common bile duct is upper normal in size for post cholecystectomy state. Note that portions of the common bile duct are not seen. Given these concerns, CT or MR, ideally with intravenous contrast, advised to further assess. 2. Gallbladder absent. No lesions seen by ultrasound in the gallbladder fossa region. 3.  Small right pleural effusion. These results will be called to the ordering clinician or representative by the Radiologist Assistant, and communication documented in the PACS or zVision Dashboard. Electronically Signed   By: Lowella Grip III M.D.   On: 05/15/2019 09:15    Anti-infectives: Anti-infectives (From admission, onward)   Start     Dose/Rate Route Frequency Ordered Stop   05/15/19 2100  piperacillin-tazobactam (ZOSYN) IVPB 3.375 g     3.375 g 12.5 mL/hr over 240 Minutes Intravenous Every 8 hours 05/15/19 1554     05/15/19 1315   piperacillin-tazobactam (ZOSYN) IVPB 3.375 g     3.375 g 100 mL/hr over 30 Minutes Intravenous  Once 05/15/19 1304 05/15/19 1343      Assessment/Plan: Impression: Postoperative intra-abdominal fluid collection with leukocytosis.  IR feels that this is not drainable at this time as it is small.  LFTs are improving.  No significant evidence for cholangitis. Plan: We will monitor LFTs and white blood cell count.  Continue Zosyn.  Advance to heart healthy diet.  LOS: 1 day    Aviva Signs 05/16/2019

## 2019-05-16 NOTE — Progress Notes (Signed)
Patient Demographics:    Stephen Rogers, is a 78 y.o. male, DOB - 06/16/1941, TKP:[id number]  Admit date - 05/15/2019   Admitting Physician Bethena Roys, MD  Outpatient Primary MD for the patient is [name], MD  LOS - 1   Chief Complaint  Patient presents with   Abnormal Lab        Subjective:    Stephen Rogers today has no fevers, no emesis,  No chest pain, domino pain is improving, no fevers  Assessment  & Plan :    Principal Problem:   Intra-abdominal abscess ([place]) Active Problems:   Type 2 diabetes mellitus, controlled, with renal complications ([place])   Hyperlipemia   CKD (chronic kidney disease), stage III ([place])   Anemia   Hypertension associated with stage 3 chronic kidney disease due to type 2 diabetes mellitus [facility])  Brief Summary 78 y.o. male with medical history significant for DM2, HTN, CKD3, GI bleed, DVT- 2017 who was admitted on 05/15/2019 with abdominal pain and finding of intra-abdominal abscess, general surgeon recommends conservative management with antibiotics for now  A/p  1) intra-abdominal abscess--- patient underwent open cholecystectomy and partial colectomy on 04/23/2019---Discussed with general surgeon Dr. [name], tolerating oral intake, WBC 16.6, continue IV Zosyn, preliminary blood cultures with GPC 1 out of 2 bottles  2)DM2--no recent A1c available, continue with sliding scale coverage  3)HTN--stable, continue verapamil 120 mg daily, hydralazine 50 mg 3 times daily,  may use IV labetalol when necessary  Every 4 hours for systolic blood pressure over 160 mmhg  4)HLD--okay to continue atorvastatin and aspirin as ordered  Disposition/Need for in-Hospital Stay- patient unable to be discharged at this time due to intra-abdominal abscess requiring IV antibiotics pending culture results  Code Status : Full  Family Communication:   Stephen Rogers (pt is awake and  coherent)   Disposition Plan  : TBD  Consults  :  gen surgey  DVT Prophylaxis  :   - Heparin - SCDs    Lab Results  Component Value Date   PLT 648 (H) 05/16/2019    Inpatient Medications  Scheduled Meds:  aspirin EC  81 mg Oral Daily   atorvastatin  40 mg Oral q1800   feeding supplement  1 Container Oral TID BM   feeding supplement (ENSURE ENLIVE)  237 mL Oral BID BM   heparin injection (subcutaneous)  5,000 Units Subcutaneous Q8H   hydrALAZINE  50 mg Oral TID   insulin aspart  0-9 Units Subcutaneous Q4H   pantoprazole  40 mg Oral Daily   verapamil  120 mg Oral Daily   Continuous Infusions:  sodium chloride Stopped (05/16/19 1429)   piperacillin-tazobactam (ZOSYN)  IV 3.375 g (05/16/19 1429)   PRN Meds:.acetaminophen **OR** acetaminophen, labetalol, ondansetron **OR** ondansetron (ZOFRAN) IV, polyethylene glycol    Anti-infectives (From admission, onward)   Start     Dose/Rate Route Frequency Ordered Stop   05/15/19 2100  piperacillin-tazobactam (ZOSYN) IVPB 3.375 g     3.375 g 12.5 mL/hr over 240 Minutes Intravenous Every 8 hours 05/15/19 1554     05/15/19 1315  piperacillin-tazobactam (ZOSYN) IVPB 3.375 g     3.375 g 100 mL/hr over 30 Minutes Intravenous  Once 05/15/19 1304 05/15/19 1343  Objective:   Vitals:   05/16/19 0511 05/16/19 0945 05/16/19 1339 05/16/19 1636  BP: 123/75 110/63 128/71 126/68  Pulse: 100  93   Resp: 20  18   Temp: 98.9 F (37.2 C)  98.6 F (37 C)   TempSrc: Oral  Oral   SpO2: 98%  99%   Weight:      Height:        Wt Readings from Last 3 Encounters:  05/15/19 90.4 kg  05/15/19 90.9 kg  05/14/19 100.2 kg     Intake/Output Summary (Last 24 hours) at 05/16/2019 1810 Last data filed at 05/16/2019 1733 Gross per 24 hour  Intake 1083.55 ml  Output 800 ml  Net 283.55 ml     Physical Exam   Gen:- Awake Alert,  In no apparent distress  HEENT:- [place].AT, No sclera icterus Neck-Supple Neck,No JVD,.  Lungs-   CTAB , fair symmetrical air movement CV- S1, S2 normal, regular  Abd-  +ve B.Sounds, Abd Soft, No significant tenderness,    Extremity/Skin:- No  edema, pedal pulses present  Psych-affect is appropriate, oriented x3 Neuro-no new focal deficits, no tremors   Data Review:   Micro Results Recent Results (from the past 240 hour(s))  SARS Coronavirus 2 (CEPHEID - Performed in [place] hospital lab), Hosp Order     Status: None   Collection Time: 05/15/19 12:28 PM   Specimen: Nasopharyngeal Swab  Result Value Ref Range Status   SARS Coronavirus 2 NEGATIVE NEGATIVE Final    Comment: (NOTE) If result is NEGATIVE SARS-CoV-2 target nucleic acids are NOT DETECTED. The SARS-CoV-2 RNA is generally detectable in upper and lower  respiratory specimens during the acute phase of infection. The lowest  concentration of SARS-CoV-2 viral copies this assay can detect is 250  copies / mL. A negative result does not preclude SARS-CoV-2 infection  and should not be used as the sole basis for treatment or other  patient management decisions.  A negative result may occur with  improper specimen collection / handling, submission of specimen other  than nasopharyngeal swab, presence of viral mutation(s) within the  areas targeted by this assay, and inadequate number of viral copies  (<250 copies / mL). A negative result must be combined with clinical  observations, patient history, and epidemiological information. If result is POSITIVE SARS-CoV-2 target nucleic acids are DETECTED. The SARS-CoV-2 RNA is generally detectable in upper and lower  respiratory specimens dur ing the acute phase of infection.  Positive  results are indicative of active infection with SARS-CoV-2.  Clinical  correlation with patient history and other diagnostic information is  necessary to determine patient infection status.  Positive results do  not rule out bacterial infection or co-infection with other viruses. If result is  PRESUMPTIVE POSTIVE SARS-CoV-2 nucleic acids MAY BE PRESENT.   A presumptive positive result was obtained on the submitted specimen  and confirmed on repeat testing.  While 2019 novel coronavirus  (SARS-CoV-2) nucleic acids may be present in the submitted sample  additional confirmatory testing may be necessary for epidemiological  and / or clinical management purposes  to differentiate between  SARS-CoV-2 and other Sarbecovirus currently known to infect humans.  If clinically indicated additional testing with an alternate test  methodology [phone]) is advised. The SARS-CoV-2 RNA is generally  detectable in upper and lower respiratory sp ecimens during the acute  phase of infection. The expected result is Negative. Fact Sheet for Patients:  StrictlyIdeas.no Fact Sheet for Healthcare Providers: BankingDealers.co.za This test  is not yet approved or cleared by the [place] and has been authorized for detection and/or diagnosis of SARS-CoV-2 by FDA under an Emergency Use Authorization (EUA).  This EUA will remain in effect (meaning this test can be used) for the duration of the COVID-19 declaration under Section 564(b)(1) of the Act, 21 U.S.C. section 360bbb-3(b)(1), unless the authorization is terminated or revoked sooner. Performed at [facility], [address], [place], [place] [address]   Blood culture (routine x 2)     Status: None (Preliminary result)   Collection Time: 05/15/19 12:59 PM   Specimen: BLOOD RIGHT HAND  Result Value Ref Range Status   Specimen Description BLOOD RIGHT HAND  Final   Special Requests   Final    BOTTLES DRAWN AEROBIC AND ANAEROBIC Blood Culture results may not be optimal due to an inadequate volume of blood received in culture bottles   Culture   Final    NO GROWTH < 24 HOURS Performed at [facility], [address], [place], [place] [address]    Report Status PENDING  Incomplete  Blood culture  (routine x 2)     Status: None (Preliminary result)   Collection Time: 05/15/19  1:05 PM   Specimen: BLOOD RIGHT ARM  Result Value Ref Range Status   Specimen Description   Final    BLOOD RIGHT ARM Performed at [facility], [address] [place], [place] [address]    Special Requests   Final    BOTTLES DRAWN AEROBIC AND ANAEROBIC Blood Culture results may not be optimal due to an inadequate volume of blood received in culture bottles Performed at [facility], [address], [place], [place] [address]    Culture  Setup Time   Final    GRAM POSITIVE COCCI Gram Stain Report Called to,Read Back By and Verified With: BENSON,D. AT 1358 ON 05/16/2019 BY EVA ANAEROBIC BOTTLE ONLY Performed at [place] ID to follow CRITICAL RESULT CALLED TO, READ BACK BY AND VERIFIED WITHAtilano Ina RN 05/16/19 1756 JDW Performed at [facility], [place] [address] [place], [place] [address]    Culture GRAM POSITIVE COCCI  Final   Report Status PENDING  Incomplete  Blood Culture ID Panel (Reflexed)     Status: Abnormal   Collection Time: 05/15/19  1:05 PM  Result Value Ref Range Status   Enterococcus species NOT DETECTED NOT DETECTED Final   Listeria monocytogenes NOT DETECTED NOT DETECTED Final   Staphylococcus species DETECTED (A) NOT DETECTED Final    Comment: Methicillin (oxacillin) resistant coagulase negative staphylococcus. Possible blood culture contaminant (unless isolated from more than one blood culture draw or clinical case suggests pathogenicity). No antibiotic treatment is indicated for blood  culture contaminants. CRITICAL RESULT CALLED TO, READ BACK BY AND VERIFIED WITH: D BENSON RN 05/16/19 1756 JDW    Staphylococcus aureus (BCID) NOT DETECTED NOT DETECTED Final   Methicillin resistance DETECTED (A) NOT DETECTED Final    Comment: CRITICAL RESULT CALLED TO, READ BACK BY AND VERIFIED WITH: D BENSON RN 05/16/19 1756 JDW    Streptococcus species NOT DETECTED NOT DETECTED  Final   Streptococcus agalactiae NOT DETECTED NOT DETECTED Final   Streptococcus pneumoniae NOT DETECTED NOT DETECTED Final   Streptococcus pyogenes NOT DETECTED NOT DETECTED Final   Acinetobacter baumannii NOT DETECTED NOT DETECTED Final   Enterobacteriaceae species NOT DETECTED NOT DETECTED Final   Enterobacter cloacae complex NOT DETECTED NOT DETECTED Final   Escherichia coli NOT DETECTED  NOT DETECTED Final   Klebsiella oxytoca NOT DETECTED NOT DETECTED Final   Klebsiella pneumoniae NOT DETECTED NOT DETECTED Final   Proteus species NOT DETECTED NOT DETECTED Final   Serratia marcescens NOT DETECTED NOT DETECTED Final   Haemophilus influenzae NOT DETECTED NOT DETECTED Final   Neisseria meningitidis NOT DETECTED NOT DETECTED Final   Pseudomonas aeruginosa NOT DETECTED NOT DETECTED Final   Candida albicans NOT DETECTED NOT DETECTED Final   Candida glabrata NOT DETECTED NOT DETECTED Final   Candida krusei NOT DETECTED NOT DETECTED Final   Candida parapsilosis NOT DETECTED NOT DETECTED Final   Candida tropicalis NOT DETECTED NOT DETECTED Final    Comment: Performed at [facility], [place] [address], [place], [place] [address]  MRSA PCR Screening     Status: None   Collection Time: 05/15/19  7:42 PM   Specimen: Nasal Mucosa; Nasopharyngeal  Result Value Ref Range Status   MRSA by PCR NEGATIVE NEGATIVE Final    Comment:        The GeneXpert MRSA Assay (FDA approved for NASAL specimens only), is one component of a comprehensive MRSA colonization surveillance program. It is not intended to diagnose MRSA infection nor to guide or monitor treatment for MRSA infections. Performed at [facility], [address], [place], [place] [address]     Radiology Reports Dg Knee 1-2 Views Left  Result Date: 04/29/2019 CLINICAL DATA:  Left knee pain and swelling. Fluid aspirated from knee today. Daughter. EXAM: LEFT KNEE - 1-2 VIEW COMPARISON:  None. FINDINGS: Mild to moderate tricompartmental  osteoarthritic changes present. There is evidence of a small joint effusion. No evidence of fracture or dislocation. IMPRESSION: No acute findings. Small joint effusion.  Mild to moderate osteoarthritis. Electronically Signed   By: Marin Olp M.D.   On: 04/29/2019 18:17   Dg Abd 1 View  Result Date: 05/07/2019 CLINICAL DATA:  Abdominal distension, pain, nausea and vomiting. Patient status post cholecystectomy 04/23/2019. EXAM: ABDOMEN - 1 VIEW COMPARISON:  None. FINDINGS: The bowel gas pattern is nonobstructive. There is mild gaseous distention of the stomach and colon. Cholecystectomy clips noted. No unexpected abdominal calcification or acute bony abnormality. No radio-opaque calculi or other significant radiographic abnormality are seen. IMPRESSION: Nonobstructive bowel gas pattern. Mild distention of the stomach and colon may be due to ileus. Electronically Signed   By: Inge Rise M.D.   On: 05/07/2019 15:49   Ct Abdomen Pelvis W Contrast  Result Date: 05/15/2019 CLINICAL DATA:  Diarrhea, no pain EXAM: CT ABDOMEN AND PELVIS WITH CONTRAST TECHNIQUE: Multidetector CT imaging of the abdomen and pelvis was performed using the standard protocol following bolus administration of intravenous contrast. CONTRAST:  135mL OMNIPAQUE IOHEXOL 300 MG/ML  SOLN COMPARISON:  04/19/2019 FINDINGS: Lower chest: Small right pleural effusion. Trace left pleural effusion. Right basilar atelectasis. Right gynecomastia again noted. Hepatobiliary: No focal hepatic mass. No intrahepatic fluid collection. No interval change in minimal intrahepatic biliary duct dilatation centrally. Interval cholecystectomy. 3.5 x 4.6 cm complex fluid collection in the gallbladder fossa with an air-fluid level concerning for an abscess. Pancreas: Unremarkable. No pancreatic ductal dilatation or surrounding inflammatory changes. Spleen: Normal in size without focal abnormality. Adrenals/Urinary Tract: Normal adrenal glands. Stable 2 cm left  renal cyst. Kidneys otherwise normal. No urolithiasis or obstructive uropathy. Normal bladder. Stomach/Bowel: No bowel dilatation to suggest bowel obstruction. Gastric air-fluid level with wall thickening of the antrum and proximal duodenum which may reflect an element of gastric outlet obstruction. Bowel wall thickening and  surrounding inflammatory changes around the proximal ascending colon concerning for mild colitis. Normal appendix. 2.8 cm fluid collection along the anti mesenteric aspect of the proximal ascending colon along the anterior abdominal wall most consistent with an abscess. Vascular/Lymphatic: Normal caliber abdominal aorta with mild atherosclerosis. No lymphadenopathy. Reproductive: Prostate is unremarkable. Other: No abdominal wall hernia or abnormality. No abdominopelvic ascites. Postsurgical changes in the subcutaneous fat of the right anterior abdominal wall. Musculoskeletal: No acute osseous abnormality. No aggressive osseous lesion. Degenerative disease with disc height loss at L2-3, L3-4 L4-5, L5-S1. IMPRESSION: 1. Interval cholecystectomy. 3.5 x 4.6 cm complex fluid collection in the gallbladder fossa with an air-fluid level concerning for an abscess. No CT evidence of cholangitis. 2. Gastric air-fluid level with reactive wall thickening of the antrum and proximal duodenum which may reflect an element of gastric outlet obstruction. Bowel wall thickening and surrounding inflammatory changes around the proximal ascending colon concerning for mild colitis. 2.8 cm fluid collection along the anti mesenteric aspect of the proximal ascending colon along the anterior abdominal wall most consistent with an abscess. 3. Small bilateral pleural effusions. Electronically Signed   By: Kathreen Devoid   On: 05/15/2019 12:16   Ct Abdomen Pelvis W Contrast  Result Date: 04/19/2019 CLINICAL DATA:  Rectal bleeding and anemia. EXAM: CT ABDOMEN AND PELVIS WITH CONTRAST TECHNIQUE: Multidetector CT imaging of  the abdomen and pelvis was performed using the standard protocol following bolus administration of intravenous contrast. CONTRAST:  132mL OMNIPAQUE IOHEXOL 300 MG/ML  SOLN COMPARISON:  CT dated 10/22/2016. FINDINGS: Lower chest: There is bilateral gynecomastia. The heart is enlarged. Coronary artery calcifications are noted. Hepatobiliary: The liver is unremarkable. The gallbladder is diffusely abnormal with multiple pockets of gas and hyperdense material. The gallbladder wall does not appear significantly thickened. The common bile duct is mildly dilated. There appears to be a fistula tract from the gallbladder to the hepatic flexure of the colon. This can be best visualized on the coronal and sagittal series. An old cholecystostomy tube drain track is noted coursing towards the anterior abdominal wall. Pancreas: Unremarkable. No pancreatic ductal dilatation or surrounding inflammatory changes. Spleen: Normal in size without focal abnormality. Adrenals/Urinary Tract: The left kidney is unremarkable aside from a small cyst arising from the lower pole. There is a slightly complex 2.3 cm cystic structure in the upper pole the right kidney. Both adrenal glands are unremarkable. There is no hydronephrosis. The urinary bladder is significantly distended with layering hyperdense material within the dependent portion, likely related to prior contrast injection. Stomach/Bowel: There are scattered colonic diverticula without CT evidence of diverticulitis. There are air-fluid levels in the colon which are likely related to prior colonoscopy prep. The appendix is unremarkable. The stomach is grossly unremarkable. There is no evidence of a small-bowel obstruction. Vascular/Lymphatic: Aortic atherosclerosis. No enlarged abdominal or pelvic lymph nodes. Reproductive: The prostate gland is significantly enlarged. Other: No abdominal wall hernia or abnormality. No abdominopelvic ascites. Musculoskeletal: Multilevel degenerative  changes are noted of the thoracolumbar spine. IMPRESSION: 1. Findings highly suspicious for a fistula track between the gallbladder lumen and hepatic flexure of the colon. There are multiple pockets of gas and debris within the gallbladder lumen. There is no definite CT evidence of acute cholecystitis. 2. Moderate dilatation of the common bile duct. Correlation with laboratory studies is recommended to help exclude an obstructing process. 3. Significantly distended urinary bladder. 4. No specific abnormality identified to explain the patient's painless hematochezia. 5. Cardiomegaly.  Bilateral gynecomastia is noted. Electronically Signed  By: Constance Holster M.D.   On: 04/19/2019 21:41   US Renal  Result Date: 04/28/2019 CLINICAL DATA:  Acute renal insufficiency. Patient currently admitted. Diabetes. EXAM: RENAL / URINARY TRACT ULTRASOUND COMPLETE COMPARISON:  CT 04/19/2019 FINDINGS: Right Kidney: Renal measurements: 10.8 x 5.5 x 6.8 cm = volume: 210 mL . Echogenicity within normal limits. No mass or hydronephrosis visualized. 2.3 cm upper pole cyst. Left Kidney: Renal measurements: 9.0 x 6.0 x 5.1 cm = volume: 144 mL. Echogenicity within normal limits. No mass or hydronephrosis visualized. 2.6 cm midpole cyst and 2.5 cm upper pole cyst. Bladder: Appears normal for degree of bladder distention. IMPRESSION: Normal size kidneys without hydronephrosis.  Bilateral renal cysts. Electronically Signed   By: Marin Olp M.D.   On: 04/28/2019 16:26   Dg Chest Port 1 View  Result Date: 04/30/2019 CLINICAL DATA:  Rectal bleeding.  Open cholecystectomy April 23, 2019. EXAM: PORTABLE CHEST 1 VIEW COMPARISON:  April 27, 2019 FINDINGS: Low lung volumes limit evaluation. Opacity remains in the right base, likely a small effusion with atelectasis. Infiltrate not excluded. No change in the cardiomediastinal silhouette. IMPRESSION: Mild opacity in the right base may represent a small effusion with atelectasis versus  infiltrate. Recommend follow-up to resolution. Electronically Signed   By: Dorise Bullion III M.D   On: 04/30/2019 14:22   Dg Chest Port 1 View  Result Date: 04/27/2019 CLINICAL DATA:  Fever. EXAM: PORTABLE CHEST 1 VIEW COMPARISON:  10/21/2016. FINDINGS: Again demonstrated is a very poor inspiration. Mild-to-moderate bibasilar atelectasis. Mild peribronchial thickening. Stable borderline enlarged cardiac silhouette. Unremarkable bones. IMPRESSION: 1. Poor inspiration with mild to moderate bibasilar atelectasis. 2. Mild bronchitic changes. Electronically Signed   By: Claudie Revering M.D.   On: 04/27/2019 13:28   US Abdomen Limited Ruq  Result Date: 05/15/2019 CLINICAL DATA:  Recent cholecystectomy.  Elevated liver enzymes EXAM: ULTRASOUND ABDOMEN LIMITED RIGHT UPPER QUADRANT COMPARISON:  CT abdomen and pelvis Apr 19, 2019 FINDINGS: Gallbladder: Surgically absent. No lesions seen in the gallbladder fossa by ultrasound. Common bile duct: Diameter: 10 mm which is upper normal in size for post cholecystectomy state. No appreciable intrahepatic biliary duct dilatation evident. Note that portions of the common bile duct are obscured by gas. Liver: Echogenic foci which shadow are noted in the liver, likely with in intrahepatic ducts. Appearance suggests air in these areas. No liver mass lesions are evident. Within normal limits in parenchymal echogenicity. Portal vein is patent on color Doppler imaging with normal direction of blood flow towards the liver. There is a small right pleural effusion. IMPRESSION: 1. Suspect foci of air within intrahepatic biliary ducts. This finding raises concern for potential degree of cholangitis. The common bile duct is upper normal in size for post cholecystectomy state. Note that portions of the common bile duct are not seen. Given these concerns, CT or MR, ideally with intravenous contrast, advised to further assess. 2. Gallbladder absent. No lesions seen by ultrasound in the  gallbladder fossa region. 3.  Small right pleural effusion. These results will be called to the ordering clinician or representative by the Radiologist Assistant, and communication documented in the PACS or zVision Dashboard. Electronically Signed   By: Lowella Grip III M.D.   On: 05/15/2019 09:15     CBC Recent Labs  Lab 05/14/19 0831 05/15/19 1011 05/16/19 0603  WBC 14.4* 17.6* 16.6*  HGB 8.7* 9.4* 8.7*  HCT 28.2* 30.3* 28.7*  PLT 749* 797* 648*  MCV 92.5 92.4 92.9  MCH 28.5  28.7 28.2  MCHC 30.9 31.0 30.3  RDW 14.7 14.6 14.6  LYMPHSABS  --  4.1*  --   MONOABS  --  0.9  --   EOSABS  --  0.4  --   BASOSABS  --  0.1  --     Chemistries  Recent Labs  Lab 05/14/19 0831 05/15/19 1011 05/16/19 0603  Stephen Rogers 138 138 140  K 4.6 4.6 4.4  CL 100 100 104  CO2 25 23 27   GLUCOSE 195* 220* 168*  BUN 26* 29* 21  CREATININE 1.41* 1.38* 1.40*  CALCIUM 8.0* 8.4* 8.2*  AST 107* 92* 68*  ALT 159* 149* 111*  ALKPHOS 101 108 94  BILITOT 0.2* 0.5 0.4   ------------------------------------------------------------------------------------------------------------------ No results for input(s): CHOL, HDL, LDLCALC, TRIG, CHOLHDL, LDLDIRECT in the last 72 hours.  Lab Results  Component Value Date   HGBA1C 8.4 (H) 10/14/2016   ------------------------------------------------------------------------------------------------------------------ No results for input(s): TSH, T4TOTAL, T3FREE, THYROIDAB in the last 72 hours.  Invalid input(s): FREET3 ------------------------------------------------------------------------------------------------------------------ No results for input(s): VITAMINB12, FOLATE, FERRITIN, TIBC, IRON, RETICCTPCT in the last 72 hours.  Coagulation profile Recent Labs  Lab 05/15/19 1011  INR 1.1    No results for input(s): DDIMER in the last 72 hours.  Cardiac Enzymes No results for input(s): CKMB, TROPONINI, MYOGLOBIN in the last 168 hours.  Invalid input(s):  CK ------------------------------------------------------------------------------------------------------------------    Component Value Date/Time   BNP 62.7 10/12/2016 1707     Jozey Janco M.D on 05/16/2019 at 6:10 PM  Go to [email or web address] - for contact info  [facility] - Office  [phone]

## 2019-05-16 NOTE — Progress Notes (Signed)
Phone call in from [place] in lab with critical values. Blood culture positive for gram + cocci. MD notified. Elodia Florence RN 05/16/2019 1402

## 2019-05-16 NOTE — TOC Initial Note (Signed)
Transition of Care (TOC) - Initial/Assessment Note    Patient Details  Name: Stephen SITZMAN Sr. MRN: [id number] Date of Birth: [birth year]  Transition of Care [facility]) CM/SW Contact:    Boneta Lucks, RN Phone Number: 05/16/2019, 1:20 PM  Clinical Narrative:        Patient is high risk for readmission. Patient is now living at [facility] and is followed by the facility doctor.   Contacted [facility] for number of days left at the facility. Waiting for a return call.  MD expects patient to be in the hospital a couple more days.         Activities of Daily Living Home Assistive Devices/Equipment: Gilford Rile (specify type), Cane (specify quad or straight)(Pt states he doesnt use them at this time) ADL Screening (condition at time of admission) Patient's cognitive ability adequate to safely complete daily activities?: Yes Is the patient deaf or have difficulty hearing?: No Does the patient have difficulty seeing, even when wearing glasses/contacts?: No Does the patient have difficulty concentrating, remembering, or making decisions?: No Patient able to express need for assistance with ADLs?: Yes Does the patient have difficulty dressing or bathing?: No Independently performs ADLs?: Yes (appropriate for developmental age) Does the patient have difficulty walking or climbing stairs?: No Weakness of Legs: None Weakness of Arms/Hands: None    Admission diagnosis:  Postoperative intra-abdominal abscess [T81.49XA] Hyperglycemia [R73.9] Intra-abdominal abscess (HCC) [K65.1] Elevated LFTs [R94.5] Patient Active Problem List   Diagnosis Date Noted  . Elevated liver enzymes 05/15/2019  . Intra-abdominal abscess ([place]) 05/15/2019  . CKD stage 3 due to type 2 diabetes mellitus ([place]) 05/07/2019  . Dyslipidemia associated with type 2 diabetes mellitus ([place]) 05/07/2019  . Hypertension associated with stage 3 chronic kidney disease due to type 2 diabetes mellitus ([place]) 05/07/2019  . GERD without  esophagitis 05/07/2019  . Calculus of gallbladder with cholecystitis without biliary obstruction   . Choloenteric fistula   . History of cholecystitis   . Rectal bleeding 04/18/2019  . Anemia 04/18/2019  . Acute GI bleeding   . Cholecystitis   . Fever   . At high risk for fluid overload 10/13/2016  . Fluid overload 10/13/2016  . Bilateral lower extremity edema 10/13/2016  . Gait disturbance 10/13/2016  . Leukocytosis 10/13/2016  . CKD (chronic kidney disease), stage III ([place]) 10/13/2016  . Left leg DVT ([place]) 10/13/2016  . Acute renal failure (ARF) ([place]) 10/08/2016  . Nausea and vomiting 10/08/2016  . Type 2 diabetes mellitus, controlled, with renal complications ([place]) 81/44/8185  . Transaminitis 10/08/2016  . Hyperlipemia 10/08/2016  . Renal insufficiency   . Dehydration 10/07/2016   PCP:  [name], MD Pharmacy:   [place], [place] [address] Phone: [phone] Fax: [phone]  [facility] [address] [place] [address] [place] [address] [place] [address] [place] [address] Phone: [phone] Fax: [phone]     Social Determinants of Health (SDOH) Interventions    Readmission Risk Interventions Readmission Risk Prevention Plan 04/25/2019  Transportation Screening Complete  Home Care Screening Complete  Some recent data might be hidden

## 2019-05-17 LAB — COMPREHENSIVE METABOLIC PANEL
ALT: 102 U/L — ABNORMAL HIGH (ref 0–44)
AST: 57 U/L — ABNORMAL HIGH (ref 15–41)
Albumin: 2.5 g/dL — ABNORMAL LOW (ref 3.5–5.0)
Alkaline Phosphatase: 94 U/L (ref 38–126)
Anion gap: 13 (ref 5–15)
BUN: 21 mg/dL (ref 8–23)
CO2: 25 mmol/L (ref 22–32)
Calcium: 8.2 mg/dL — ABNORMAL LOW (ref 8.9–10.3)
Chloride: 101 mmol/L (ref 98–111)
Creatinine, Ser: 1.44 mg/dL — ABNORMAL HIGH (ref 0.61–1.24)
GFR calc Af Amer: 53 mL/min — ABNORMAL LOW (ref >60)
GFR calc non Af Amer: 46 mL/min — ABNORMAL LOW (ref >60)
Glucose, Bld: 179 mg/dL — ABNORMAL HIGH (ref 70–99)
Potassium: 4.3 mmol/L (ref 3.5–5.1)
Sodium: 139 mmol/L (ref 135–145)
Total Bilirubin: 0.4 mg/dL (ref 0.3–1.2)
Total Protein: 6.5 g/dL (ref 6.5–8.1)

## 2019-05-17 LAB — CBC
HCT: 28.5 % — ABNORMAL LOW (ref 39.0–52.0)
Hemoglobin: 8.6 g/dL — ABNORMAL LOW (ref 13.0–17.0)
MCH: 28.1 pg (ref 26.0–34.0)
MCHC: 30.2 g/dL (ref 30.0–36.0)
MCV: 93.1 fL (ref 80.0–100.0)
Platelets: 568 10*3/uL — ABNORMAL HIGH (ref 150–400)
RBC: 3.06 MIL/uL — ABNORMAL LOW (ref 4.22–5.81)
RDW: 14.7 % (ref 11.5–15.5)
WBC: 15 10*3/uL — ABNORMAL HIGH (ref 4.0–10.5)
nRBC: 0 % (ref 0.0–0.2)

## 2019-05-17 LAB — GLUCOSE, CAPILLARY
Glucose-Capillary: 161 mg/dL — ABNORMAL HIGH (ref 70–99)
Glucose-Capillary: 165 mg/dL — ABNORMAL HIGH (ref 70–99)
Glucose-Capillary: 171 mg/dL — ABNORMAL HIGH (ref 70–99)
Glucose-Capillary: 238 mg/dL — ABNORMAL HIGH (ref 70–99)
Glucose-Capillary: 258 mg/dL — ABNORMAL HIGH (ref 70–99)
Glucose-Capillary: 184 mg/dL — ABNORMAL HIGH (ref 70–99)
Glucose-Capillary: 258 mg/dL — ABNORMAL HIGH (ref 70–99)
Glucose-Capillary: 240 mg/dL — ABNORMAL HIGH (ref 70–99)

## 2019-05-17 NOTE — [place] FL2 (Signed)
[place] MEDICAID FL2 LEVEL OF CARE SCREENING TOOL     IDENTIFICATION  Patient Name: Stephen Rogers Sr. Birthdate: Oct 05, 1941 Sex: male Admission Date (Current Location): 05/15/2019  [facility] [place] Number:  [employer] and Address:  [place] [address] [place]      Provider Number: [id number]  Attending Physician Name and Address:  Roxan Hockey, MD  Relative Name and Phone Number:       Current Level of Care: Hospital Recommended Level of Care: [place] Prior Approval Number:    Date Approved/Denied:   PASRR Number: [id number]  Discharge Plan: SNF    Current Diagnoses: Patient Active Problem List   Diagnosis Date Noted  . Elevated liver enzymes 05/15/2019  . Intra-abdominal abscess ([place]) 05/15/2019  . CKD stage 3 due to type 2 diabetes mellitus ([place]) 05/07/2019  . Dyslipidemia associated with type 2 diabetes mellitus ([place]) 05/07/2019  . Hypertension associated with stage 3 chronic kidney disease due to type 2 diabetes mellitus ([place]) 05/07/2019  . GERD without esophagitis 05/07/2019  . Calculus of gallbladder with cholecystitis without biliary obstruction   . Choloenteric fistula   . History of cholecystitis   . Rectal bleeding 04/18/2019  . Anemia 04/18/2019  . Acute GI bleeding   . Cholecystitis   . Fever   . At high risk for fluid overload 10/13/2016  . Fluid overload 10/13/2016  . Bilateral lower extremity edema 10/13/2016  . Gait disturbance 10/13/2016  . Leukocytosis 10/13/2016  . CKD (chronic kidney disease), stage III ([place]) 10/13/2016  . Left leg DVT ([place]) 10/13/2016  . Acute renal failure (ARF) ([place]) 10/08/2016  . Nausea and vomiting 10/08/2016  . Type 2 diabetes mellitus, controlled, with renal complications ([place]) 79/39/0300  . Transaminitis 10/08/2016  . Hyperlipemia 10/08/2016  . Renal insufficiency   . Dehydration 10/07/2016    Orientation RESPIRATION BLADDER Height &  Weight     Self, Time, Situation, Place  Normal Incontinent Weight: 90.4 kg Height:  5\' 6"  (167.6 cm)  BEHAVIORAL SYMPTOMS/MOOD NEUROLOGICAL BOWEL NUTRITION STATUS      Continent Diet(Heart Healthy)  AMBULATORY STATUS COMMUNICATION OF NEEDS Skin   Limited Assist Verbally Surgical wounds(abdomen)                       Personal Care Assistance Level of Assistance  Bathing, Feeding, Dressing Bathing Assistance: Limited assistance Feeding assistance: Independent Dressing Assistance: Limited assistance     Functional Limitations Info  Sight, Hearing, Speech Sight Info: Adequate Hearing Info: Adequate Speech Info: Adequate    SPECIAL CARE FACTORS FREQUENCY  PT (By licensed PT)     PT Frequency: 5 times a week              Contractures Contractures Info: Not present    Additional Factors Info  Code Status, Allergies Code Status Info: Full Allergies Info: NKDA           Current Medications (05/17/2019):  This is the current hospital active medication list Current Facility-Administered Medications  Medication Dose Route Frequency Provider Last Rate Last Dose  . acetaminophen (TYLENOL) tablet 650 mg  650 mg Oral Q6H PRN Emokpae, Ejiroghene E, MD       Or  . acetaminophen (TYLENOL) suppository 650 mg  650 mg Rectal Q6H PRN Emokpae, Ejiroghene E, MD      . aspirin EC tablet 81 mg  81 mg Oral Daily Emokpae, Ejiroghene E, MD   81 mg  at 05/17/19 0950  . atorvastatin (LIPITOR) tablet 40 mg  40 mg Oral q1800 Emokpae, Ejiroghene E, MD   40 mg at 05/16/19 1753  . feeding supplement (BOOST / RESOURCE BREEZE) liquid 1 Container  1 Container Oral TID BM Aviva Signs, MD   1 Container at 05/17/19 [phone]  . feeding supplement (ENSURE ENLIVE) (ENSURE ENLIVE) liquid 237 mL  237 mL Oral BID BM Emokpae, Courage, MD   237 mL at 05/17/19 0947  . heparin injection 5,000 Units  5,000 Units Subcutaneous Q8H Emokpae, Courage, MD   5,000 Units at 05/17/19 0541  . hydrALAZINE (APRESOLINE)  tablet 50 mg  50 mg Oral TID Emokpae, Ejiroghene E, MD   50 mg at 05/17/19 0950  . insulin aspart (novoLOG) injection 0-9 Units  0-9 Units Subcutaneous Q4H Emokpae, Ejiroghene E, MD   2 Units at 05/17/19 0902  . labetalol (NORMODYNE) injection 10 mg  10 mg Intravenous Q4H PRN Emokpae, Courage, MD      . ondansetron (ZOFRAN) tablet 4 mg  4 mg Oral Q6H PRN Emokpae, Ejiroghene E, MD       Or  . ondansetron (ZOFRAN) injection 4 mg  4 mg Intravenous Q6H PRN Emokpae, Ejiroghene E, MD      . pantoprazole (PROTONIX) EC tablet 40 mg  40 mg Oral Daily Emokpae, Ejiroghene E, MD   40 mg at 05/17/19 0950  . piperacillin-tazobactam (ZOSYN) IVPB 3.375 g  3.375 g Intravenous Q8H Coffee, Donna Christen, RPH 12.5 mL/hr at 05/17/19 0542 3.375 g at 05/17/19 0542  . polyethylene glycol (MIRALAX / GLYCOLAX) packet 17 g  17 g Oral Daily PRN Emokpae, Ejiroghene E, MD      . verapamil (CALAN-SR) CR tablet 120 mg  120 mg Oral Daily Emokpae, Ejiroghene E, MD   120 mg at 05/17/19 7711     Discharge Medications: Please see discharge summary for a list of discharge medications.  Relevant Imaging Results:  Relevant Lab Results:   Additional Information [id number]  Boneta Lucks, RN

## 2019-05-17 NOTE — Progress Notes (Signed)
0000 BS 171, did not cross over

## 2019-05-17 NOTE — Progress Notes (Deleted)
0000 BS 174, did not cross over.

## 2019-05-17 NOTE — Progress Notes (Signed)
0400 BS 165, did not cross over

## 2019-05-17 NOTE — TOC Progression Note (Signed)
Transition of Care (TOC) - Progression Note    Patient Details  Name: Stephen TIPPIN Sr. MRN: [id number] Date of Birth: [birth year]  Transition of Care [facility]) CM/SW Contact  Boneta Lucks, RN Phone Number: 05/17/2019, 2:09 PM  Clinical Narrative:   Patient expected to discharge back to the [facility] Friday or Saturday cultures pending. FL2 done and sent. Insurance Authorization done.           Expected Discharge Plan and [place]      Readmission Risk Interventions Readmission Risk Prevention Plan 05/16/2019 04/25/2019  Transportation Screening Complete Complete  PCP or Specialist Appt within 3-5 Days Complete -  Home Care Screening - Complete  HRI or Home Care Consult Not Complete -  Social Work Consult for Recovery Care Planning/Counseling Complete -  Palliative Care Screening Not Complete -  Medication Review Press photographer) Complete -  Some recent data might be hidden

## 2019-05-17 NOTE — Progress Notes (Signed)
Subjective: Patient has no complaints.  Objective: Vital signs in last 24 hours: Temp:  [98.4 F (36.9 C)-98.6 F (37 C)] 98.4 F (36.9 C) (06/25 0510) Pulse Rate:  [93-106] 106 (06/25 0510) Resp:  [18-20] 20 (06/25 0510) BP: (110-151)/(63-82) 151/75 (06/25 0510) SpO2:  [97 %-99 %] 97 % (06/25 0510) Last BM Date: 05/16/19  Intake/Output from previous day: 06/24 0701 - 06/25 0700 In: 837.3 [P.O.:240; I.V.:547.3; IV Piggyback:50] Out: 300 [Urine:300] Intake/Output this shift: No intake/output data recorded.  General appearance: alert, cooperative and no distress GI: soft, non-tender; bowel sounds normal; no masses,  no organomegaly and Incision without purulent drainage  Lab Results:  Recent Labs    05/16/19 0603 05/17/19 0525  WBC 16.6* 15.0*  HGB 8.7* 8.6*  HCT 28.7* 28.5*  PLT 648* 568*   BMET Recent Labs    05/16/19 0603 05/17/19 0525  NA 140 139  K 4.4 4.3  CL 104 101  CO2 27 25  GLUCOSE 168* 179*  BUN 21 21  CREATININE 1.40* 1.44*  CALCIUM 8.2* 8.2*   PT/INR Recent Labs    05/15/19 1011  LABPROT 14.0  INR 1.1    Studies/Results: Ct Abdomen Pelvis W Contrast  Result Date: 05/15/2019 CLINICAL DATA:  Diarrhea, no pain EXAM: CT ABDOMEN AND PELVIS WITH CONTRAST TECHNIQUE: Multidetector CT imaging of the abdomen and pelvis was performed using the standard protocol following bolus administration of intravenous contrast. CONTRAST:  111mL OMNIPAQUE IOHEXOL 300 MG/ML  SOLN COMPARISON:  04/19/2019 FINDINGS: Lower chest: Small right pleural effusion. Trace left pleural effusion. Right basilar atelectasis. Right gynecomastia again noted. Hepatobiliary: No focal hepatic mass. No intrahepatic fluid collection. No interval change in minimal intrahepatic biliary duct dilatation centrally. Interval cholecystectomy. 3.5 x 4.6 cm complex fluid collection in the gallbladder fossa with an air-fluid level concerning for an abscess. Pancreas: Unremarkable. No pancreatic  ductal dilatation or surrounding inflammatory changes. Spleen: Normal in size without focal abnormality. Adrenals/Urinary Tract: Normal adrenal glands. Stable 2 cm left renal cyst. Kidneys otherwise normal. No urolithiasis or obstructive uropathy. Normal bladder. Stomach/Bowel: No bowel dilatation to suggest bowel obstruction. Gastric air-fluid level with wall thickening of the antrum and proximal duodenum which may reflect an element of gastric outlet obstruction. Bowel wall thickening and surrounding inflammatory changes around the proximal ascending colon concerning for mild colitis. Normal appendix. 2.8 cm fluid collection along the anti mesenteric aspect of the proximal ascending colon along the anterior abdominal wall most consistent with an abscess. Vascular/Lymphatic: Normal caliber abdominal aorta with mild atherosclerosis. No lymphadenopathy. Reproductive: Prostate is unremarkable. Other: No abdominal wall hernia or abnormality. No abdominopelvic ascites. Postsurgical changes in the subcutaneous fat of the right anterior abdominal wall. Musculoskeletal: No acute osseous abnormality. No aggressive osseous lesion. Degenerative disease with disc height loss at L2-3, L3-4 L4-5, L5-S1. IMPRESSION: 1. Interval cholecystectomy. 3.5 x 4.6 cm complex fluid collection in the gallbladder fossa with an air-fluid level concerning for an abscess. No CT evidence of cholangitis. 2. Gastric air-fluid level with reactive wall thickening of the antrum and proximal duodenum which may reflect an element of gastric outlet obstruction. Bowel wall thickening and surrounding inflammatory changes around the proximal ascending colon concerning for mild colitis. 2.8 cm fluid collection along the anti mesenteric aspect of the proximal ascending colon along the anterior abdominal wall most consistent with an abscess. 3. Small bilateral pleural effusions. Electronically Signed   By: Kathreen Devoid   On: 05/15/2019 12:16   US Abdomen  Limited Ruq  Result Date:  05/15/2019 CLINICAL DATA:  Recent cholecystectomy.  Elevated liver enzymes EXAM: ULTRASOUND ABDOMEN LIMITED RIGHT UPPER QUADRANT COMPARISON:  CT abdomen and pelvis Apr 19, 2019 FINDINGS: Gallbladder: Surgically absent. No lesions seen in the gallbladder fossa by ultrasound. Common bile duct: Diameter: 10 mm which is upper normal in size for post cholecystectomy state. No appreciable intrahepatic biliary duct dilatation evident. Note that portions of the common bile duct are obscured by gas. Liver: Echogenic foci which shadow are noted in the liver, likely with in intrahepatic ducts. Appearance suggests air in these areas. No liver mass lesions are evident. Within normal limits in parenchymal echogenicity. Portal vein is patent on color Doppler imaging with normal direction of blood flow towards the liver. There is a small right pleural effusion. IMPRESSION: 1. Suspect foci of air within intrahepatic biliary ducts. This finding raises concern for potential degree of cholangitis. The common bile duct is upper normal in size for post cholecystectomy state. Note that portions of the common bile duct are not seen. Given these concerns, CT or MR, ideally with intravenous contrast, advised to further assess. 2. Gallbladder absent. No lesions seen by ultrasound in the gallbladder fossa region. 3.  Small right pleural effusion. These results will be called to the ordering clinician or representative by the Radiologist Assistant, and communication documented in the PACS or zVision Dashboard. Electronically Signed   By: Lowella Grip III M.D.   On: 05/15/2019 09:15    Anti-infectives: Anti-infectives (From admission, onward)   Start     Dose/Rate Route Frequency Ordered Stop   05/15/19 2100  piperacillin-tazobactam (ZOSYN) IVPB 3.375 g     3.375 g 12.5 mL/hr over 240 Minutes Intravenous Every 8 hours 05/15/19 1554     05/15/19 1315  piperacillin-tazobactam (ZOSYN) IVPB 3.375 g      3.375 g 100 mL/hr over 30 Minutes Intravenous  Once 05/15/19 1304 05/15/19 1343      Assessment/Plan: Impression: Leukocytosis seems to be resolving.  Patient is afebrile.  Bowel movements are within normal limits.  LFTs improving. Plan: We will repeat CT scan of abdomen tomorrow to assess fluid collection.  LOS: 2 days    Aviva Signs 05/17/2019

## 2019-05-17 NOTE — Progress Notes (Signed)
Patient Demographics:    Stephen Rogers, is a 78 y.o. male, DOB - 22-Sep-1941, VHQ:[id number]  Admit date - 05/15/2019   Admitting Physician Bethena Roys, MD  Outpatient Primary MD for the patient is Stephen Dad, MD  LOS - 2   Chief Complaint  Patient presents with   Abnormal Lab        Subjective:    [name] today has no fevers, no emesis,  No chest pain, no chills, eating and drinking okay, belly pain improving  Assessment  & Plan :    Principal Problem:   Intra-abdominal abscess ([place]) Active Problems:   Type 2 diabetes mellitus, controlled, with renal complications ([place])   Hyperlipemia   CKD (chronic kidney disease), stage III ([place])   Anemia   Hypertension associated with stage 3 chronic kidney disease due to type 2 diabetes mellitus [facility])  Brief Summary 78 y.o. male with medical history significant for DM2, HTN, CKD3, GI bleed, DVT- 2017 who was admitted on 05/15/2019 with abdominal pain and finding of intra-abdominal abscess, general surgeon recommends conservative management with antibiotics for now  A/p  1) intra-abdominal abscess--- patient underwent open cholecystectomy and partial colectomy on 04/23/2019---Discussed with general surgeon Dr. [name], tolerating oral intake, WBC 16.6, continue IV Zosyn, blood cultures from 05/15/2019 with coagulase-negative staph in 1 out of 2 bottles most likely contaminant WBC still elevated at 15 K, possible discharge back to SNF in 1 to 2 days on Augmentin if continues to improve.. AST is 57, ALT is 102  2)DM2--no recent A1c available, continue with sliding scale coverage  3)HTN--stable, continue verapamil 120 mg daily, hydralazine 50 mg 3 times daily,  may use IV labetalol when necessary  Every 4 hours for systolic blood pressure over 160 mmhg  4)HLD-- stable, continue atorvastatin and aspirin as ordered  5)CKD III-creatinine is  1.4 which is close to patient's baseline, avoid nephrotoxic agent  6) chronic anemia--- CKD may be contributing, hemoglobin is currently 8.6 which is close to patient's recent baseline no evidence of acute bleeding at this time  Disposition/Need for in-Hospital Stay- patient unable to be discharged at this time due to intra-abdominal abscess requiring IV antibiotics --WBC remains elevated  Code Status : Full  Family Communication:   Na (pt is awake and coherent)   Disposition Plan  : SNF in 1 to 2 days if continues to improve  Consults  :  gen surgey  DVT Prophylaxis  :   - Heparin - SCDs    Lab Results  Component Value Date   PLT 568 (H) 05/17/2019    Inpatient Medications  Scheduled Meds:  aspirin EC  81 mg Oral Daily   atorvastatin  40 mg Oral q1800   feeding supplement  1 Container Oral TID BM   feeding supplement (ENSURE ENLIVE)  237 mL Oral BID BM   heparin injection (subcutaneous)  5,000 Units Subcutaneous Q8H   hydrALAZINE  50 mg Oral TID   insulin aspart  0-9 Units Subcutaneous Q4H   pantoprazole  40 mg Oral Daily   verapamil  120 mg Oral Daily   Continuous Infusions:  piperacillin-tazobactam (ZOSYN)  IV 3.375 g (05/17/19 0542)   PRN Meds:.acetaminophen **OR** acetaminophen, labetalol, ondansetron **OR** ondansetron (ZOFRAN) IV, polyethylene  glycol    Anti-infectives (From admission, onward)   Start     Dose/Rate Route Frequency Ordered Stop   05/15/19 2100  piperacillin-tazobactam (ZOSYN) IVPB 3.375 g     3.375 g 12.5 mL/hr over 240 Minutes Intravenous Every 8 hours 05/15/19 1554     05/15/19 1315  piperacillin-tazobactam (ZOSYN) IVPB 3.375 g     3.375 g 100 mL/hr over 30 Minutes Intravenous  Once 05/15/19 1304 05/15/19 1343        Objective:   Vitals:   05/16/19 1636 05/16/19 2020 05/16/19 2123 05/17/19 0510  BP: 126/68  (!) 144/82 (!) 151/75  Pulse:   93 (!) 106  Resp:   20 20  Temp:   98.5 F (36.9 C) 98.4 F (36.9 C)  TempSrc:    Oral Oral  SpO2:  99% 97% 97%  Weight:      Height:        Wt Readings from Last 3 Encounters:  05/15/19 90.4 kg  05/15/19 90.9 kg  05/14/19 100.2 kg     Intake/Output Summary (Last 24 hours) at 05/17/2019 1620 Last data filed at 05/17/2019 0545 Gross per 24 hour  Intake 240 ml  Output 300 ml  Net -60 ml     Physical Exam   Gen:- Awake Alert,  In no apparent distress  HEENT:- [place].AT, No sclera icterus Neck-Supple Neck,No JVD,.  Lungs-  CTAB , fair symmetrical air movement CV- S1, S2 normal, regular  Abd-  +ve B.Sounds, Abd Soft, No significant tenderness, no CVA tenderness Extremity/Skin:- No  edema, pedal pulses present  Psych-affect is appropriate, oriented x3 Neuro-no new focal deficits, no tremors   Data Review:   Micro Results Recent Results (from the past 240 hour(s))  SARS Coronavirus 2 (CEPHEID - Performed in [place] hospital lab), Hosp Order     Status: None   Collection Time: 05/15/19 12:28 PM   Specimen: Nasopharyngeal Swab  Result Value Ref Range Status   SARS Coronavirus 2 NEGATIVE NEGATIVE Final    Comment: (NOTE) If result is NEGATIVE SARS-CoV-2 target nucleic acids are NOT DETECTED. The SARS-CoV-2 RNA is generally detectable in upper and lower  respiratory specimens during the acute phase of infection. The lowest  concentration of SARS-CoV-2 viral copies this assay can detect is 250  copies / mL. A negative result does not preclude SARS-CoV-2 infection  and should not be used as the sole basis for treatment or other  patient management decisions.  A negative result may occur with  improper specimen collection / handling, submission of specimen other  than nasopharyngeal swab, presence of viral mutation(s) within the  areas targeted by this assay, and inadequate number of viral copies  (<250 copies / mL). A negative result must be combined with clinical  observations, patient history, and epidemiological information. If result is  POSITIVE SARS-CoV-2 target nucleic acids are DETECTED. The SARS-CoV-2 RNA is generally detectable in upper and lower  respiratory specimens dur ing the acute phase of infection.  Positive  results are indicative of active infection with SARS-CoV-2.  Clinical  correlation with patient history and other diagnostic information is  necessary to determine patient infection status.  Positive results do  not rule out bacterial infection or co-infection with other viruses. If result is PRESUMPTIVE POSTIVE SARS-CoV-2 nucleic acids MAY BE PRESENT.   A presumptive positive result was obtained on the submitted specimen  and confirmed on repeat testing.  While 2019 novel coronavirus  (SARS-CoV-2) nucleic acids may be present in the  submitted sample  additional confirmatory testing may be necessary for epidemiological  and / or clinical management purposes  to differentiate between  SARS-CoV-2 and other Sarbecovirus currently known to infect humans.  If clinically indicated additional testing with an alternate test  methodology [phone]) is advised. The SARS-CoV-2 RNA is generally  detectable in upper and lower respiratory sp ecimens during the acute  phase of infection. The expected result is Negative. Fact Sheet for Patients:  StrictlyIdeas.no Fact Sheet for Healthcare Providers: BankingDealers.co.za This test is not yet approved or cleared by the [place] FDA and has been authorized for detection and/or diagnosis of SARS-CoV-2 by FDA under an Emergency Use Authorization (EUA).  This EUA will remain in effect (meaning this test can be used) for the duration of the COVID-19 declaration under Section 564(b)(1) of the Act, 21 U.S.C. section 360bbb-3(b)(1), unless the authorization is terminated or revoked sooner. Performed at [facility], [address], [place], [place] [address]   Blood culture (routine x 2)     Status: None (Preliminary  result)   Collection Time: 05/15/19 12:59 PM   Specimen: BLOOD RIGHT HAND  Result Value Ref Range Status   Specimen Description BLOOD RIGHT HAND  Final   Special Requests   Final    BOTTLES DRAWN AEROBIC AND ANAEROBIC Blood Culture results may not be optimal due to an inadequate volume of blood received in culture bottles   Culture   Final    NO GROWTH 2 DAYS Performed at [facility], [address] [place], [place] [address]    Report Status PENDING  Incomplete  Blood culture (routine x 2)     Status: Abnormal (Preliminary result)   Collection Time: 05/15/19  1:05 PM   Specimen: BLOOD RIGHT ARM  Result Value Ref Range Status   Specimen Description   Final    BLOOD RIGHT ARM Performed at [facility], [address] [place], [place] [address]    Special Requests   Final    BOTTLES DRAWN AEROBIC AND ANAEROBIC Blood Culture results may not be optimal due to an inadequate volume of blood received in culture bottles Performed at [facility], [address], [place], [place] [address]    Culture  Setup Time   Final    GRAM POSITIVE COCCI Gram Stain Report Called to,Read Back By and Verified With: BENSON,D. AT 1358 ON 05/16/2019 BY EVA ANAEROBIC BOTTLE ONLY Performed at [place] BY AND VERIFIED WITHAtilano Ina RN 05/16/19 1756 JDW Performed at [facility], [place] [address], [place], [place] [address]    Culture STAPHYLOCOCCUS SPECIES (COAGULASE NEGATIVE) (A)  Final   Report Status PENDING  Incomplete  Blood Culture ID Panel (Reflexed)     Status: Abnormal   Collection Time: 05/15/19  1:05 PM  Result Value Ref Range Status   Enterococcus species NOT DETECTED NOT DETECTED Final   Listeria monocytogenes NOT DETECTED NOT DETECTED Final   Staphylococcus species DETECTED (A) NOT DETECTED Final    Comment: Methicillin (oxacillin) resistant coagulase negative staphylococcus. Possible blood culture contaminant (unless isolated from more than  one blood culture draw or clinical case suggests pathogenicity). No antibiotic treatment is indicated for blood  culture contaminants. CRITICAL RESULT CALLED TO, READ BACK BY AND VERIFIED WITH: D BENSON RN 05/16/19 1756 JDW    Staphylococcus aureus (BCID) NOT DETECTED NOT DETECTED Final   Methicillin resistance DETECTED (A) NOT DETECTED Final    Comment: CRITICAL RESULT CALLED  TO, READ BACK BY AND VERIFIED WITH: D BENSON RN 05/16/19 1756 JDW    Streptococcus species NOT DETECTED NOT DETECTED Final   Streptococcus agalactiae NOT DETECTED NOT DETECTED Final   Streptococcus pneumoniae NOT DETECTED NOT DETECTED Final   Streptococcus pyogenes NOT DETECTED NOT DETECTED Final   Acinetobacter baumannii NOT DETECTED NOT DETECTED Final   Enterobacteriaceae species NOT DETECTED NOT DETECTED Final   Enterobacter cloacae complex NOT DETECTED NOT DETECTED Final   Escherichia coli NOT DETECTED NOT DETECTED Final   Klebsiella oxytoca NOT DETECTED NOT DETECTED Final   Klebsiella pneumoniae NOT DETECTED NOT DETECTED Final   Proteus species NOT DETECTED NOT DETECTED Final   Serratia marcescens NOT DETECTED NOT DETECTED Final   Haemophilus influenzae NOT DETECTED NOT DETECTED Final   Neisseria meningitidis NOT DETECTED NOT DETECTED Final   Pseudomonas aeruginosa NOT DETECTED NOT DETECTED Final   Candida albicans NOT DETECTED NOT DETECTED Final   Candida glabrata NOT DETECTED NOT DETECTED Final   Candida krusei NOT DETECTED NOT DETECTED Final   Candida parapsilosis NOT DETECTED NOT DETECTED Final   Candida tropicalis NOT DETECTED NOT DETECTED Final    Comment: Performed at [facility], [place] [address] [place], [place] [address]  MRSA PCR Screening     Status: None   Collection Time: 05/15/19  7:42 PM   Specimen: Nasal Mucosa; Nasopharyngeal  Result Value Ref Range Status   MRSA by PCR NEGATIVE NEGATIVE Final    Comment:        The GeneXpert MRSA Assay (FDA approved for NASAL specimens only),  is one component of a comprehensive MRSA colonization surveillance program. It is not intended to diagnose MRSA infection nor to guide or monitor treatment for MRSA infections. Performed at [facility], [address], [place], [place] [address]     Radiology Reports Dg Knee 1-2 Views Left  Result Date: 04/29/2019 CLINICAL DATA:  Left knee pain and swelling. Fluid aspirated from knee today. Daughter. EXAM: LEFT KNEE - 1-2 VIEW COMPARISON:  None. FINDINGS: Mild to moderate tricompartmental osteoarthritic changes present. There is evidence of a small joint effusion. No evidence of fracture or dislocation. IMPRESSION: No acute findings. Small joint effusion.  Mild to moderate osteoarthritis. Electronically Signed   By: Marin Olp M.D.   On: 04/29/2019 18:17   Dg Abd 1 View  Result Date: 05/07/2019 CLINICAL DATA:  Abdominal distension, pain, nausea and vomiting. Patient status post cholecystectomy 04/23/2019. EXAM: ABDOMEN - 1 VIEW COMPARISON:  None. FINDINGS: The bowel gas pattern is nonobstructive. There is mild gaseous distention of the stomach and colon. Cholecystectomy clips noted. No unexpected abdominal calcification or acute bony abnormality. No radio-opaque calculi or other significant radiographic abnormality are seen. IMPRESSION: Nonobstructive bowel gas pattern. Mild distention of the stomach and colon may be due to ileus. Electronically Signed   By: Inge Rise M.D.   On: 05/07/2019 15:49   Ct Abdomen Pelvis W Contrast  Result Date: 05/15/2019 CLINICAL DATA:  Diarrhea, no pain EXAM: CT ABDOMEN AND PELVIS WITH CONTRAST TECHNIQUE: Multidetector CT imaging of the abdomen and pelvis was performed using the standard protocol following bolus administration of intravenous contrast. CONTRAST:  131mL OMNIPAQUE IOHEXOL 300 MG/ML  SOLN COMPARISON:  04/19/2019 FINDINGS: Lower chest: Small right pleural effusion. Trace left pleural effusion. Right basilar atelectasis. Right gynecomastia  again noted. Hepatobiliary: No focal hepatic mass. No intrahepatic fluid collection. No interval change in minimal intrahepatic biliary duct dilatation centrally. Interval cholecystectomy. 3.5 x 4.6 cm complex fluid  collection in the gallbladder fossa with an air-fluid level concerning for an abscess. Pancreas: Unremarkable. No pancreatic ductal dilatation or surrounding inflammatory changes. Spleen: Normal in size without focal abnormality. Adrenals/Urinary Tract: Normal adrenal glands. Stable 2 cm left renal cyst. Kidneys otherwise normal. No urolithiasis or obstructive uropathy. Normal bladder. Stomach/Bowel: No bowel dilatation to suggest bowel obstruction. Gastric air-fluid level with wall thickening of the antrum and proximal duodenum which may reflect an element of gastric outlet obstruction. Bowel wall thickening and surrounding inflammatory changes around the proximal ascending colon concerning for mild colitis. Normal appendix. 2.8 cm fluid collection along the anti mesenteric aspect of the proximal ascending colon along the anterior abdominal wall most consistent with an abscess. Vascular/Lymphatic: Normal caliber abdominal aorta with mild atherosclerosis. No lymphadenopathy. Reproductive: Prostate is unremarkable. Other: No abdominal wall hernia or abnormality. No abdominopelvic ascites. Postsurgical changes in the subcutaneous fat of the right anterior abdominal wall. Musculoskeletal: No acute osseous abnormality. No aggressive osseous lesion. Degenerative disease with disc height loss at L2-3, L3-4 L4-5, L5-S1. IMPRESSION: 1. Interval cholecystectomy. 3.5 x 4.6 cm complex fluid collection in the gallbladder fossa with an air-fluid level concerning for an abscess. No CT evidence of cholangitis. 2. Gastric air-fluid level with reactive wall thickening of the antrum and proximal duodenum which may reflect an element of gastric outlet obstruction. Bowel wall thickening and surrounding inflammatory changes  around the proximal ascending colon concerning for mild colitis. 2.8 cm fluid collection along the anti mesenteric aspect of the proximal ascending colon along the anterior abdominal wall most consistent with an abscess. 3. Small bilateral pleural effusions. Electronically Signed   By: Kathreen Devoid   On: 05/15/2019 12:16   Ct Abdomen Pelvis W Contrast  Result Date: 04/19/2019 CLINICAL DATA:  Rectal bleeding and anemia. EXAM: CT ABDOMEN AND PELVIS WITH CONTRAST TECHNIQUE: Multidetector CT imaging of the abdomen and pelvis was performed using the standard protocol following bolus administration of intravenous contrast. CONTRAST:  130mL OMNIPAQUE IOHEXOL 300 MG/ML  SOLN COMPARISON:  CT dated 10/22/2016. FINDINGS: Lower chest: There is bilateral gynecomastia. The heart is enlarged. Coronary artery calcifications are noted. Hepatobiliary: The liver is unremarkable. The gallbladder is diffusely abnormal with multiple pockets of gas and hyperdense material. The gallbladder wall does not appear significantly thickened. The common bile duct is mildly dilated. There appears to be a fistula tract from the gallbladder to the hepatic flexure of the colon. This can be best visualized on the coronal and sagittal series. An old cholecystostomy tube drain track is noted coursing towards the anterior abdominal wall. Pancreas: Unremarkable. No pancreatic ductal dilatation or surrounding inflammatory changes. Spleen: Normal in size without focal abnormality. Adrenals/Urinary Tract: The left kidney is unremarkable aside from a small cyst arising from the lower pole. There is a slightly complex 2.3 cm cystic structure in the upper pole the right kidney. Both adrenal glands are unremarkable. There is no hydronephrosis. The urinary bladder is significantly distended with layering hyperdense material within the dependent portion, likely related to prior contrast injection. Stomach/Bowel: There are scattered colonic diverticula without  CT evidence of diverticulitis. There are air-fluid levels in the colon which are likely related to prior colonoscopy prep. The appendix is unremarkable. The stomach is grossly unremarkable. There is no evidence of a small-bowel obstruction. Vascular/Lymphatic: Aortic atherosclerosis. No enlarged abdominal or pelvic lymph nodes. Reproductive: The prostate gland is significantly enlarged. Other: No abdominal wall hernia or abnormality. No abdominopelvic ascites. Musculoskeletal: Multilevel degenerative changes are noted of the thoracolumbar spine. IMPRESSION:  1. Findings highly suspicious for a fistula track between the gallbladder lumen and hepatic flexure of the colon. There are multiple pockets of gas and debris within the gallbladder lumen. There is no definite CT evidence of acute cholecystitis. 2. Moderate dilatation of the common bile duct. Correlation with laboratory studies is recommended to help exclude an obstructing process. 3. Significantly distended urinary bladder. 4. No specific abnormality identified to explain the patient's painless hematochezia. 5. Cardiomegaly.  Bilateral gynecomastia is noted. Electronically Signed   By: Constance Holster M.D.   On: 04/19/2019 21:41   US Renal  Result Date: 04/28/2019 CLINICAL DATA:  Acute renal insufficiency. Patient currently admitted. Diabetes. EXAM: RENAL / URINARY TRACT ULTRASOUND COMPLETE COMPARISON:  CT 04/19/2019 FINDINGS: Right Kidney: Renal measurements: 10.8 x 5.5 x 6.8 cm = volume: 210 mL . Echogenicity within normal limits. No mass or hydronephrosis visualized. 2.3 cm upper pole cyst. Left Kidney: Renal measurements: 9.0 x 6.0 x 5.1 cm = volume: 144 mL. Echogenicity within normal limits. No mass or hydronephrosis visualized. 2.6 cm midpole cyst and 2.5 cm upper pole cyst. Bladder: Appears normal for degree of bladder distention. IMPRESSION: Normal size kidneys without hydronephrosis.  Bilateral renal cysts. Electronically Signed   By: Marin Olp M.D.   On: 04/28/2019 16:26   Dg Chest Port 1 View  Result Date: 04/30/2019 CLINICAL DATA:  Rectal bleeding.  Open cholecystectomy April 23, 2019. EXAM: PORTABLE CHEST 1 VIEW COMPARISON:  April 27, 2019 FINDINGS: Low lung volumes limit evaluation. Opacity remains in the right base, likely a small effusion with atelectasis. Infiltrate not excluded. No change in the cardiomediastinal silhouette. IMPRESSION: Mild opacity in the right base may represent a small effusion with atelectasis versus infiltrate. Recommend follow-up to resolution. Electronically Signed   By: Dorise Bullion III M.D   On: 04/30/2019 14:22   Dg Chest Port 1 View  Result Date: 04/27/2019 CLINICAL DATA:  Fever. EXAM: PORTABLE CHEST 1 VIEW COMPARISON:  10/21/2016. FINDINGS: Again demonstrated is a very poor inspiration. Mild-to-moderate bibasilar atelectasis. Mild peribronchial thickening. Stable borderline enlarged cardiac silhouette. Unremarkable bones. IMPRESSION: 1. Poor inspiration with mild to moderate bibasilar atelectasis. 2. Mild bronchitic changes. Electronically Signed   By: Claudie Revering M.D.   On: 04/27/2019 13:28   US Abdomen Limited Ruq  Result Date: 05/15/2019 CLINICAL DATA:  Recent cholecystectomy.  Elevated liver enzymes EXAM: ULTRASOUND ABDOMEN LIMITED RIGHT UPPER QUADRANT COMPARISON:  CT abdomen and pelvis Apr 19, 2019 FINDINGS: Gallbladder: Surgically absent. No lesions seen in the gallbladder fossa by ultrasound. Common bile duct: Diameter: 10 mm which is upper normal in size for post cholecystectomy state. No appreciable intrahepatic biliary duct dilatation evident. Note that portions of the common bile duct are obscured by gas. Liver: Echogenic foci which shadow are noted in the liver, likely with in intrahepatic ducts. Appearance suggests air in these areas. No liver mass lesions are evident. Within normal limits in parenchymal echogenicity. Portal vein is patent on color Doppler imaging with normal direction of  blood flow towards the liver. There is a small right pleural effusion. IMPRESSION: 1. Suspect foci of air within intrahepatic biliary ducts. This finding raises concern for potential degree of cholangitis. The common bile duct is upper normal in size for post cholecystectomy state. Note that portions of the common bile duct are not seen. Given these concerns, CT or MR, ideally with intravenous contrast, advised to further assess. 2. Gallbladder absent. No lesions seen by ultrasound in the gallbladder fossa region. 3.  Small right pleural effusion. These results will be called to the ordering clinician or representative by the Radiologist Assistant, and communication documented in the PACS or zVision Dashboard. Electronically Signed   By: Lowella Grip III M.D.   On: 05/15/2019 09:15     CBC Recent Labs  Lab 05/14/19 0831 05/15/19 1011 05/16/19 0603 05/17/19 0525  WBC 14.4* 17.6* 16.6* 15.0*  HGB 8.7* 9.4* 8.7* 8.6*  HCT 28.2* 30.3* 28.7* 28.5*  PLT 749* 797* 648* 568*  MCV 92.5 92.4 92.9 93.1  MCH 28.5 28.7 28.2 28.1  MCHC 30.9 31.0 30.3 30.2  RDW 14.7 14.6 14.6 14.7  LYMPHSABS  --  4.1*  --   --   MONOABS  --  0.9  --   --   EOSABS  --  0.4  --   --   BASOSABS  --  0.1  --   --     Chemistries  Recent Labs  Lab 05/14/19 0831 05/15/19 1011 05/16/19 0603 05/17/19 0525  NA 138 138 140 139  K 4.6 4.6 4.4 4.3  CL 100 100 104 101  CO2 25 23 27 25   GLUCOSE 195* 220* 168* 179*  BUN 26* 29* 21 21  CREATININE 1.41* 1.38* 1.40* 1.44*  CALCIUM 8.0* 8.4* 8.2* 8.2*  AST 107* 92* 68* 57*  ALT 159* 149* 111* 102*  ALKPHOS 101 108 94 94  BILITOT 0.2* 0.5 0.4 0.4   ------------------------------------------------------------------------------------------------------------------ No results for input(s): CHOL, HDL, LDLCALC, TRIG, CHOLHDL, LDLDIRECT in the last 72 hours.  Lab Results  Component Value Date   HGBA1C 8.4 (H) 10/14/2016    ------------------------------------------------------------------------------------------------------------------ No results for input(s): TSH, T4TOTAL, T3FREE, THYROIDAB in the last 72 hours.  Invalid input(s): FREET3 ------------------------------------------------------------------------------------------------------------------ No results for input(s): VITAMINB12, FOLATE, FERRITIN, TIBC, IRON, RETICCTPCT in the last 72 hours.  Coagulation profile Recent Labs  Lab 05/15/19 1011  INR 1.1    No results for input(s): DDIMER in the last 72 hours.  Cardiac Enzymes No results for input(s): CKMB, TROPONINI, MYOGLOBIN in the last 168 hours.  Invalid input(s): CK ------------------------------------------------------------------------------------------------------------------    Component Value Date/Time   BNP 62.7 10/12/2016 1707     Mortimer Bair M.D on 05/17/2019 at 4:20 PM  Go to [email or web address] - for contact info  [facility] - Office  [phone]

## 2019-05-18 ENCOUNTER — Inpatient Hospital Stay (HOSPITAL_COMMUNITY): Payer: Medicare HMO

## 2019-05-18 LAB — COMPREHENSIVE METABOLIC PANEL
ALT: 92 U/L — ABNORMAL HIGH (ref 0–44)
AST: 48 U/L — ABNORMAL HIGH (ref 15–41)
Albumin: 2.7 g/dL — ABNORMAL LOW (ref 3.5–5.0)
Alkaline Phosphatase: 104 U/L (ref 38–126)
Anion gap: 10 (ref 5–15)
BUN: 15 mg/dL (ref 8–23)
CO2: 25 mmol/L (ref 22–32)
Calcium: 8.2 mg/dL — ABNORMAL LOW (ref 8.9–10.3)
Chloride: 104 mmol/L (ref 98–111)
Creatinine, Ser: 1.25 mg/dL — ABNORMAL HIGH (ref 0.61–1.24)
GFR calc Af Amer: >60 mL/min (ref >60)
GFR calc non Af Amer: 54 mL/min — ABNORMAL LOW (ref >60)
Glucose, Bld: 201 mg/dL — ABNORMAL HIGH (ref 70–99)
Potassium: 4.2 mmol/L (ref 3.5–5.1)
Sodium: 139 mmol/L (ref 135–145)
Total Bilirubin: 0.2 mg/dL — ABNORMAL LOW (ref 0.3–1.2)
Total Protein: 7.1 g/dL (ref 6.5–8.1)

## 2019-05-18 LAB — CULTURE, BLOOD (ROUTINE X 2)

## 2019-05-18 LAB — GLUCOSE, CAPILLARY
Glucose-Capillary: 167 mg/dL — ABNORMAL HIGH (ref 70–99)
Glucose-Capillary: 169 mg/dL — ABNORMAL HIGH (ref 70–99)
Glucose-Capillary: 170 mg/dL — ABNORMAL HIGH (ref 70–99)
Glucose-Capillary: 199 mg/dL — ABNORMAL HIGH (ref 70–99)
Glucose-Capillary: 231 mg/dL — ABNORMAL HIGH (ref 70–99)
Glucose-Capillary: 244 mg/dL — ABNORMAL HIGH (ref 70–99)

## 2019-05-18 LAB — CBC
HCT: 28.1 % — ABNORMAL LOW (ref 39.0–52.0)
Hemoglobin: 8.6 g/dL — ABNORMAL LOW (ref 13.0–17.0)
MCH: 28.1 pg (ref 26.0–34.0)
MCHC: 30.6 g/dL (ref 30.0–36.0)
MCV: 91.8 fL (ref 80.0–100.0)
Platelets: 495 10*3/uL — ABNORMAL HIGH (ref 150–400)
RBC: 3.06 MIL/uL — ABNORMAL LOW (ref 4.22–5.81)
RDW: 14.9 % (ref 11.5–15.5)
WBC: 12.6 10*3/uL — ABNORMAL HIGH (ref 4.0–10.5)
nRBC: 0.2 % (ref 0.0–0.2)

## 2019-05-18 MED ORDER — BISACODYL 10 MG RE SUPP
10.0000 mg | Freq: Once | RECTAL | Status: DC
  Filled 2019-05-18 (×2): qty 1

## 2019-05-18 MED ORDER — GADOBUTROL 1 MMOL/ML IV SOLN
10.0000 mL | Freq: Once | INTRAVENOUS | Status: AC | PRN
  Administered 2019-05-18: 10 mL via INTRAVENOUS

## 2019-05-18 MED ORDER — IOHEXOL 300 MG/ML  SOLN
100.0000 mL | Freq: Once | INTRAMUSCULAR | Status: AC | PRN
  Administered 2019-05-18: 100 mL via INTRAVENOUS

## 2019-05-18 MED ORDER — LACTULOSE 10 GM/15ML PO SOLN
30.0000 g | Freq: Once | ORAL | Status: DC
  Filled 2019-05-18: qty 60

## 2019-05-18 MED ORDER — HEPARIN SODIUM (PORCINE) 5000 UNIT/ML IJ SOLN
5000.0000 [IU] | Freq: Three times a day (TID) | INTRAMUSCULAR | Status: DC
  Administered 2019-05-19 – 2019-05-21 (×5): 5000 [IU] via SUBCUTANEOUS
  Filled 2019-05-18 (×5): qty 1

## 2019-05-18 MED ORDER — IOHEXOL 300 MG/ML  SOLN
30.0000 mL | Freq: Once | INTRAMUSCULAR | Status: AC | PRN
  Administered 2019-05-18: 30 mL via ORAL

## 2019-05-18 MED ORDER — POLYETHYLENE GLYCOL 3350 17 G PO PACK
17.0000 g | PACK | Freq: Two times a day (BID) | ORAL | Status: DC
  Administered 2019-05-18 – 2019-05-21 (×3): 17 g via ORAL
  Filled 2019-05-18 (×5): qty 1

## 2019-05-18 NOTE — Progress Notes (Signed)
Physical Therapy Treatment Patient Details Name: Stephen COURY Sr. MRN: [id number] DOB: 1941/03/18 Today's Date: 05/18/2019    History of Present Illness Stephen SCHERTZER Sr. is a 78 y.o. male with medical history significant for DM2, HTN, CKD3, GI bleed, DVT- 2017, who was sent to the ED from nursing home with reports of abnormal ultrasound and elevated liver enzymes.  Patient was recently hospitalized 5/27 to 6/11 for rectal bleed requiring 5 units of PRBC and Chole- colo fistula, requiring open cholecystectomy and partial colectomy 12/25/7626, complicated by acute kidney injury and left knee swelling requiring arthrocentesis performed 6/7 with findings suggestive of inflammatory osteoarthritis.  Per chart patient has had nausea since discharge.  Patient to me denies nausea but reports he has been on a liquid diet since hospitalization and now he does not like the taste of solid diet.  He has had ~2 loose stools daily since discharge, no blood, no black stools.  No abdominal pain.  No reported fevers or chills.Also reports generalized weakness since discharge, that seems to be improving, as yesterday he was able to ambulate at the nursing home, ambulation mostly limited by pain in his left ankle-arthritis.  He reports urinary and bowel incontinence also since discharge.  No abnormal sensation in his lower extremities no back pain.    PT Comments    Patient agreeable for therapy and requires less assistance for sitting up at bedside with use of bed rail, increased endurance/distance for gait training without loss of balance, limited mostly due to c/o fatigue and has to make wide turns due to unsteadiness.  Patient demonstrates fair/good return for completing BLE ROM/exercises seated at bedside, had difficulty fully extending knees against gravity due to weakness.  Patient tolerated sitting up in chair after therapy.  Patient will benefit from continued physical therapy in hospital and recommended venue  below to increase strength, balance, endurance for safe ADLs and gait.    Follow Up Recommendations  SNF     Equipment Recommendations  None recommended by PT    Recommendations for Other Services       Precautions / Restrictions Precautions Precautions: Fall Restrictions Weight Bearing Restrictions: No    Mobility  Bed Mobility Overal bed mobility: Needs Assistance Bed Mobility: Supine to Sit Rolling: Supervision         General bed mobility comments: slightly increased time, labored movement, use of bedrail  Transfers Overall transfer level: Needs assistance Equipment used: Standard walker Transfers: Sit to/from Stand;Stand Pivot Transfers Sit to Stand: Min assist Stand pivot transfers: Min assist       General transfer comment: increased BLE strength for sit to stands, labored movement  Ambulation/Gait Ambulation/Gait assistance: Min assist Gait Distance (Feet): 25 Feet Assistive device: Rolling walker (2 wheeled) Gait Pattern/deviations: Decreased step length - right;Decreased step length - left;Decreased stride length Gait velocity: decreased   General Gait Details: increased endurance/distance for ambulation with slow labored cadence, slightly unsteady, has to make wide turns, limited secondary to c/o fatigue   Stairs             Wheelchair Mobility    Modified Rankin (Stroke Patients Only)       Balance Overall balance assessment: Needs assistance Sitting-balance support: Feet supported;No upper extremity supported   Sitting balance - Comments: fair/good seated at bedside   Standing balance support: During functional activity;Bilateral upper extremity supported Standing balance-Leahy Scale: Fair Standing balance comment: using RW  Cognition Arousal/Alertness: Awake/alert Behavior During Therapy: WFL for tasks assessed/performed Overall Cognitive Status: Within Functional Limits for tasks  assessed                                        Exercises General Exercises - Lower Extremity Long Arc Quad: Seated;AAROM;AROM;Strengthening;Both;10 reps Hip Flexion/Marching: Seated;Strengthening;AROM;Both;10 reps Toe Raises: Seated;Strengthening;AROM;Both;10 reps Heel Raises: Seated;AROM;Strengthening;Both;10 reps    General Comments        Pertinent Vitals/Pain Pain Assessment: No/denies pain    Home Living                      Prior Function            PT Goals (current goals can now be found in the care plan section) Acute Rehab PT Goals Patient Stated Goal: return home after rehab PT Goal Formulation: With patient Time For Goal Achievement: 05/30/19 Potential to Achieve Goals: Good Progress towards PT goals: Progressing toward goals    Frequency    Min 3X/week      PT Plan Current plan remains appropriate    Co-evaluation              AM-PAC PT "6 Clicks" Mobility   Outcome Measure  Help needed turning from your back to your side while in a flat bed without using bedrails?: None Help needed moving from lying on your back to sitting on the side of a flat bed without using bedrails?: A Little(has to use siderail) Help needed moving to and from a bed to a chair (including a wheelchair)?: A Little Help needed standing up from a chair using your arms (e.g., wheelchair or bedside chair)?: A Little Help needed to walk in hospital room?: A Lot Help needed climbing 3-5 steps with a railing? : A Lot 6 Click Score: 17    End of Session   Activity Tolerance: Patient tolerated treatment well;Patient limited by fatigue Patient left: in chair;with call bell/phone within reach Nurse Communication: Mobility status PT Visit Diagnosis: Unsteadiness on feet (R26.81);Other abnormalities of gait and mobility (R26.89);Muscle weakness (generalized) (M62.81)     Time: 6759-1638 PT Time Calculation (min) (ACUTE ONLY): 36 min  Charges:   $Therapeutic Exercise: 8-22 mins $Therapeutic Activity: 8-22 mins                     3:25 PM, 05/18/19 Lonell Grandchild, MPT Physical Therapist with [facility] [phone] office [phone] mobile phone

## 2019-05-18 NOTE — Progress Notes (Signed)
  Subjective: Patient has no complaints.  Objective: Vital signs in last 24 hours: Temp:  [97.3 F (36.3 C)-98.2 F (36.8 C)] 98.1 F (36.7 C) (06/26 0517) Pulse Rate:  [93-101] 95 (06/26 0517) Resp:  [16-20] 16 (06/26 0517) BP: (137-158)/(77-85) 149/82 (06/26 0517) SpO2:  [93 %-98 %] 96 % (06/26 0517) Last BM Date: 05/17/19  Intake/Output from previous day: 06/25 0701 - 06/26 0700 In: 960 [P.O.:960] Out: 1600 [Urine:1600] Intake/Output this shift: No intake/output data recorded.  General appearance: alert, cooperative and no distress GI: soft, non-tender; bowel sounds normal; no masses,  no organomegaly and Incision healing well.  Lab Results:  Recent Labs    05/17/19 0525 05/18/19 0820  WBC 15.0* 12.6*  HGB 8.6* 8.6*  HCT 28.5* 28.1*  PLT 568* 495*   BMET Recent Labs    05/17/19 0525 05/18/19 0820  NA 139 139  K 4.3 4.2  CL 101 104  CO2 25 25  GLUCOSE 179* 201*  BUN 21 15  CREATININE 1.44* 1.25*  CALCIUM 8.2* 8.2*   PT/INR No results for input(s): LABPROT, INR in the last 72 hours.  Studies/Results: No results found.  Anti-infectives: Anti-infectives (From admission, onward)   Start     Dose/Rate Route Frequency Ordered Stop   05/15/19 2100  piperacillin-tazobactam (ZOSYN) IVPB 3.375 g     3.375 g 12.5 mL/hr over 240 Minutes Intravenous Every 8 hours 05/15/19 1554     05/15/19 1315  piperacillin-tazobactam (ZOSYN) IVPB 3.375 g     3.375 g 100 mL/hr over 30 Minutes Intravenous  Once 05/15/19 1304 05/15/19 1343      Assessment/Plan: Impression: Leukocytosis almost resolved.  LFTs are normalizing. Plan: Will get CT scan of the abdomen.  If it shows improvement or no worsening of the fluid collection, would discharge back to nursing care facility.  Augmentin x1 week should be prescribed.  This was discussed with Dr. Denton Brick.  LOS: 3 days    Aviva Signs 05/18/2019

## 2019-05-18 NOTE — Progress Notes (Signed)
Inpatient Diabetes Program Recommendations  AACE/ADA: New Consensus Statement on Inpatient Glycemic Control  Target Ranges:  Prepandial:   less than 140 mg/dL      Peak postprandial:   less than 180 mg/dL (1-2 hours)      Critically ill patients:  140 - 180 mg/dL   Results for RYLIN, SEAVEY SR. (MRN [id number]) as of 05/18/2019 10:48  Ref. Range 05/17/2019 07:41 05/17/2019 11:32 05/17/2019 16:37 05/17/2019 20:09 05/18/2019 00:44 05/18/2019 04:17 05/18/2019 07:56  Glucose-Capillary Latest Ref Range: 70 - 99 mg/dL 161 (H) 184 (H) 258 (H) 240 (H) 169 (H) 199 (H) 167 (H)   Review of Glycemic Control  Diabetes history:DM2 Outpatient Diabetes medications: Metformin 500 mg BID Current orders for Inpatient glycemic control: Novolog 0-9 units Q4H  Inpatient Diabetes Program Recommendations:   Diet: If appropriate, please consider adding Carb Modified to Heart Healthy diet.  Thanks, Barnie Alderman, RN, MSN, CDE Diabetes Coordinator Inpatient Diabetes Program [phone] (Team Pager from 8am to 5pm)

## 2019-05-18 NOTE — Progress Notes (Signed)
Patient Demographics:    Stephen Rogers, is a 78 y.o. male, DOB - 1941/09/27, MOQ:[id number]  Admit date - 05/15/2019   Admitting Physician Bethena Roys, MD  Outpatient Primary MD for the patient is [name], MD  LOS - 3   Chief Complaint  Patient presents with   Abnormal Lab        Subjective:    Stephen Rogers today has no fevers, no emesis,  No chest pain, no chills, constipation concerns persist, no significant abdominal pain,  Assessment  & Plan :    Principal Problem:   Intra-abdominal abscess ([place]) Active Problems:   Type 2 diabetes mellitus, controlled, with renal complications ([place])   Hyperlipemia   CKD (chronic kidney disease), stage III ([place])   Anemia   Hypertension associated with stage 3 chronic kidney disease due to type 2 diabetes mellitus [facility])  Brief Summary 78 y.o. male with medical history significant for DM2, HTN, CKD3, GI bleed, DVT- 2017 who is status post open cholecystectomy and partial colectomy for Chole-colo-fistula--- S/p surgery on 04/23/2019-- was Readmitted on 05/15/2019 with abdominal pain and finding of intra-abdominal abscess, general surgeon recommends conservative management with iv zosyn for now-  repeat CT abdomen and pelvis on 05/18/2019 and MRCP on 05/18/2019 concerning for abscess, pneumobilia, and biliary leak --discussed with Dr. Laurence Spates from [facility] --patient may need ERCP with stent placement --transfer to [place]-   A/p  1) intra-abdominal abscess---status post open cholecystectomy and partial colectomy for Chole-colo-fistula on 04/23/2019--- ---Per IR abscess too small to drain --Discussed with general surgeon Dr. [name], tolerating oral intake, WBC down to 12.6 , continue IV Zosyn, blood cultures from 05/15/2019 with coagulase-negative staph in 1 out of 2 bottles most likely contaminant LFTs trending down- --repeat CT  abdomen and pelvis on 05/18/2019 and MRCP on 05/18/2019 concerning for abscess, pneumobilia, and biliary leak --discussed with Dr. Laurence Spates from [facility] --patient may need ERCP with stent placement --transfer to [place]-  2)DM2--no recent A1c available, continue with sliding scale coverage  3)HTN--stable, continue verapamil 120 mg daily, hydralazine 50 mg 3 times daily,  may use IV labetalol when necessary  Every 4 hours for systolic blood pressure over 160 mmhg  4)HLD-- stable, continue atorvastatin and aspirin as ordered  5)CKD III-creatinine is 1.25 which is close to patient's baseline, avoid nephrotoxic agent  6) chronic anemia--- CKD may be contributing, hemoglobin is currently 8.6 which is close to patient's recent baseline no evidence of acute bleeding at this time  7) constipation--- bowel regimen as ordered  8) generalized weakness--- PT recommends SNF rehab  Code Status : Full  Family Communication: Left messages for both  daughter at [phone] and sister at [phone]  (pt is awake and coherent)   Disposition Plan  :  [facility] for GI evaluation and possible ERCP  Consults  :  gen surgey/[facility]  DVT Prophylaxis  :   - Heparin - SCDs    Lab Results  Component Value Date   PLT 495 (H) 05/18/2019    Inpatient Medications  Scheduled Meds:  aspirin EC  81 mg Oral Daily   atorvastatin  40 mg Oral q1800   bisacodyl  10 mg Rectal Once  feeding supplement  1 Container Oral TID BM   feeding supplement (ENSURE ENLIVE)  237 mL Oral BID BM   [START ON 05/19/2019] heparin injection (subcutaneous)  5,000 Units Subcutaneous Q8H   hydrALAZINE  50 mg Oral TID   insulin aspart  0-9 Units Subcutaneous Q4H   lactulose  30 g Oral Once   pantoprazole  40 mg Oral Daily   polyethylene glycol  17 g Oral BID   verapamil  120 mg Oral Daily   Continuous Infusions:  piperacillin-tazobactam (ZOSYN)  IV 3.375 g (05/18/19 0559)   PRN Meds:.acetaminophen  **OR** acetaminophen, labetalol, ondansetron **OR** ondansetron (ZOFRAN) IV    Anti-infectives (From admission, onward)   Start     Dose/Rate Route Frequency Ordered Stop   05/15/19 2100  piperacillin-tazobactam (ZOSYN) IVPB 3.375 g     3.375 g 12.5 mL/hr over 240 Minutes Intravenous Every 8 hours 05/15/19 1554     05/15/19 1315  piperacillin-tazobactam (ZOSYN) IVPB 3.375 g     3.375 g 100 mL/hr over 30 Minutes Intravenous  Once 05/15/19 1304 05/15/19 1343        Objective:   Vitals:   05/17/19 2035 05/17/19 2253 05/18/19 0517 05/18/19 1416  BP:  (!) 158/85 (!) 149/82 (!) 160/98  Pulse:  93 95 (!) 102  Resp:  17 16 18   Temp:  (!) 97.3 F (36.3 C) 98.1 F (36.7 C) 98.4 F (36.9 C)  TempSrc:  Oral  Oral  SpO2: 93% 97% 96%   Weight:      Height:        Wt Readings from Last 3 Encounters:  05/15/19 90.4 kg  05/15/19 90.9 kg  05/14/19 100.2 kg     Intake/Output Summary (Last 24 hours) at 05/18/2019 1809 Last data filed at 05/18/2019 1300 Gross per 24 hour  Intake 480 ml  Output 1400 ml  Net -920 ml   Physical Exam   Gen:- Awake Alert,  In no apparent distress  HEENT:- [place].AT, No sclera icterus Neck-Supple Neck,No JVD,.  Lungs-  CTAB , fair symmetrical air movement CV- S1, S2 normal, regular  Abd-  +ve B.Sounds, Abd Soft, No significant tenderness, no CVA tenderness Extremity/Skin:- No  edema, pedal pulses present  Psych-affect is appropriate, oriented x3 Neuro-generalized weakness , no new focal deficits, no tremors   Data Review:   Micro Results Recent Results (from the past 240 hour(s))  SARS Coronavirus 2 (CEPHEID - Performed in [facility] hospital lab), Hosp Order     Status: None   Collection Time: 05/15/19 12:28 PM   Specimen: Nasopharyngeal Swab  Result Value Ref Range Status   SARS Coronavirus 2 NEGATIVE NEGATIVE Final    Comment: (NOTE) If result is NEGATIVE SARS-CoV-2 target nucleic acids are NOT DETECTED. The SARS-CoV-2 RNA is generally  detectable in upper and lower  respiratory specimens during the acute phase of infection. The lowest  concentration of SARS-CoV-2 viral copies this assay can detect is 250  copies / mL. A negative result does not preclude SARS-CoV-2 infection  and should not be used as the sole basis for treatment or other  patient management decisions.  A negative result may occur with  improper specimen collection / handling, submission of specimen other  than nasopharyngeal swab, presence of viral mutation(s) within the  areas targeted by this assay, and inadequate number of viral copies  (<250 copies / mL). A negative result must be combined with clinical  observations, patient history, and epidemiological information. If result is POSITIVE SARS-CoV-2  target nucleic acids are DETECTED. The SARS-CoV-2 RNA is generally detectable in upper and lower  respiratory specimens dur ing the acute phase of infection.  Positive  results are indicative of active infection with SARS-CoV-2.  Clinical  correlation with patient history and other diagnostic information is  necessary to determine patient infection status.  Positive results do  not rule out bacterial infection or co-infection with other viruses. If result is PRESUMPTIVE POSTIVE SARS-CoV-2 nucleic acids MAY BE PRESENT.   A presumptive positive result was obtained on the submitted specimen  and confirmed on repeat testing.  While 2019 novel coronavirus  (SARS-CoV-2) nucleic acids may be present in the submitted sample  additional confirmatory testing may be necessary for epidemiological  and / or clinical management purposes  to differentiate between  SARS-CoV-2 and other Sarbecovirus currently known to infect humans.  If clinically indicated additional testing with an alternate test  methodology [phone]) is advised. The SARS-CoV-2 RNA is generally  detectable in upper and lower respiratory sp ecimens during the acute  phase of infection. The  expected result is Negative. Fact Sheet for Patients:  StrictlyIdeas.no Fact Sheet for Healthcare Providers: BankingDealers.co.za This test is not yet approved or cleared by the [place] FDA and has been authorized for detection and/or diagnosis of SARS-CoV-2 by FDA under an Emergency Use Authorization (EUA).  This EUA will remain in effect (meaning this test can be used) for the duration of the COVID-19 declaration under Section 564(b)(1) of the Act, 21 U.S.C. section 360bbb-3(b)(1), unless the authorization is terminated or revoked sooner. Performed at [facility], [address] [place], [place] [address]   Blood culture (routine x 2)     Status: None (Preliminary result)   Collection Time: 05/15/19 12:59 PM   Specimen: BLOOD RIGHT HAND  Result Value Ref Range Status   Specimen Description BLOOD RIGHT HAND  Final   Special Requests   Final    BOTTLES DRAWN AEROBIC AND ANAEROBIC Blood Culture results may not be optimal due to an inadequate volume of blood received in culture bottles   Culture   Final    NO GROWTH 3 DAYS Performed at [facility], [address] [place], [place] [address]    Report Status PENDING  Incomplete  Blood culture (routine x 2)     Status: Abnormal   Collection Time: 05/15/19  1:05 PM   Specimen: BLOOD RIGHT ARM  Result Value Ref Range Status   Specimen Description   Final    BLOOD RIGHT ARM Performed at [facility], [address], [place], [place] [address]    Special Requests   Final    BOTTLES DRAWN AEROBIC AND ANAEROBIC Blood Culture results may not be optimal due to an inadequate volume of blood received in culture bottles Performed at [facility], [address] [place], [place] [address]    Culture  Setup Time   Final    GRAM POSITIVE COCCI Gram Stain Report Called to,Read Back By and Verified With: BENSON,D. AT 1358 ON 05/16/2019 BY EVA ANAEROBIC BOTTLE ONLY Performed at [place] BY AND VERIFIED WITH: D BENSON RN 05/16/19 1756 JDW    Culture (A)  Final    STAPHYLOCOCCUS SPECIES (COAGULASE NEGATIVE) THE SIGNIFICANCE OF ISOLATING THIS ORGANISM FROM A SINGLE SET OF BLOOD CULTURES WHEN MULTIPLE SETS ARE DRAWN IS UNCERTAIN. PLEASE NOTIFY THE MICROBIOLOGY DEPARTMENT WITHIN ONE WEEK IF SPECIATION AND SENSITIVITIES ARE REQUIRED. Performed at [facility],  [address] [place], [place] [address]    Report Status 05/18/2019 FINAL  Final  Blood Culture ID Panel (Reflexed)     Status: Abnormal   Collection Time: 05/15/19  1:05 PM  Result Value Ref Range Status   Enterococcus species NOT DETECTED NOT DETECTED Final   Listeria monocytogenes NOT DETECTED NOT DETECTED Final   Staphylococcus species DETECTED (A) NOT DETECTED Final    Comment: Methicillin (oxacillin) resistant coagulase negative staphylococcus. Possible blood culture contaminant (unless isolated from more than one blood culture draw or clinical case suggests pathogenicity). No antibiotic treatment is indicated for blood  culture contaminants. CRITICAL RESULT CALLED TO, READ BACK BY AND VERIFIED WITH: D BENSON RN 05/16/19 1756 JDW    Staphylococcus aureus (BCID) NOT DETECTED NOT DETECTED Final   Methicillin resistance DETECTED (A) NOT DETECTED Final    Comment: CRITICAL RESULT CALLED TO, READ BACK BY AND VERIFIED WITH: D BENSON RN 05/16/19 1756 JDW    Streptococcus species NOT DETECTED NOT DETECTED Final   Streptococcus agalactiae NOT DETECTED NOT DETECTED Final   Streptococcus pneumoniae NOT DETECTED NOT DETECTED Final   Streptococcus pyogenes NOT DETECTED NOT DETECTED Final   Acinetobacter baumannii NOT DETECTED NOT DETECTED Final   Enterobacteriaceae species NOT DETECTED NOT DETECTED Final   Enterobacter cloacae complex NOT DETECTED NOT DETECTED Final   Escherichia coli NOT DETECTED NOT DETECTED Final   Klebsiella oxytoca NOT DETECTED NOT DETECTED Final    Klebsiella pneumoniae NOT DETECTED NOT DETECTED Final   Proteus species NOT DETECTED NOT DETECTED Final   Serratia marcescens NOT DETECTED NOT DETECTED Final   Haemophilus influenzae NOT DETECTED NOT DETECTED Final   Neisseria meningitidis NOT DETECTED NOT DETECTED Final   Pseudomonas aeruginosa NOT DETECTED NOT DETECTED Final   Candida albicans NOT DETECTED NOT DETECTED Final   Candida glabrata NOT DETECTED NOT DETECTED Final   Candida krusei NOT DETECTED NOT DETECTED Final   Candida parapsilosis NOT DETECTED NOT DETECTED Final   Candida tropicalis NOT DETECTED NOT DETECTED Final    Comment: Performed at [facility], [place] [place], [place] [address]  MRSA PCR Screening     Status: None   Collection Time: 05/15/19  7:42 PM   Specimen: Nasal Mucosa; Nasopharyngeal  Result Value Ref Range Status   MRSA by PCR NEGATIVE NEGATIVE Final    Comment:        The GeneXpert MRSA Assay (FDA approved for NASAL specimens only), is one component of a comprehensive MRSA colonization surveillance program. It is not intended to diagnose MRSA infection nor to guide or monitor treatment for MRSA infections. Performed at [facility], [address], [place], [place] [address]     Radiology Reports Dg Knee 1-2 Views Left  Result Date: 04/29/2019 CLINICAL DATA:  Left knee pain and swelling. Fluid aspirated from knee today. Daughter. EXAM: LEFT KNEE - 1-2 VIEW COMPARISON:  None. FINDINGS: Mild to moderate tricompartmental osteoarthritic changes present. There is evidence of a small joint effusion. No evidence of fracture or dislocation. IMPRESSION: No acute findings. Small joint effusion.  Mild to moderate osteoarthritis. Electronically Signed   By: Marin Olp M.D.   On: 04/29/2019 18:17   Dg Abd 1 View  Result Date: 05/07/2019 CLINICAL DATA:  Abdominal distension, pain, nausea and vomiting. Patient status post cholecystectomy 04/23/2019. EXAM: ABDOMEN - 1 VIEW COMPARISON:  None.  FINDINGS: The bowel gas pattern is nonobstructive. There is mild gaseous distention of the stomach and colon. Cholecystectomy clips noted. No  unexpected abdominal calcification or acute bony abnormality. No radio-opaque calculi or other significant radiographic abnormality are seen. IMPRESSION: Nonobstructive bowel gas pattern. Mild distention of the stomach and colon may be due to ileus. Electronically Signed   By: Inge Rise M.D.   On: 05/07/2019 15:49   Ct Abdomen Pelvis W Contrast  Result Date: 05/18/2019 CLINICAL DATA:  Abdominal pain, status post cholecystectomy and partial colectomy. EXAM: CT ABDOMEN AND PELVIS WITH CONTRAST TECHNIQUE: Multidetector CT imaging of the abdomen and pelvis was performed using the standard protocol following bolus administration of intravenous contrast. CONTRAST:  29mL OMNIPAQUE IOHEXOL 300 MG/ML SOLN, 134mL OMNIPAQUE IOHEXOL 300 MG/ML SOLN COMPARISON:  05/15/2019, 04/19/2019 FINDINGS: Lower chest: Small right pleural effusion with basilar atelectasis. Trace left pleural effusion. Hepatobiliary: No focal mass. Interval development of air within the intrahepatic biliary collecting system 3.5 x 4.2 cm with mild dilatation of the central biliary collecting system may reflect developing cholangitis in the absence of recent instrumentation. Complex fluid collection in the gallbladder fossa with the overall size unchanged, but increase in the amount of fluid within the collection most consistent with an abscess. Mild surrounding inflammatory changes. Pancreas: Unremarkable. No pancreatic ductal dilatation or surrounding inflammatory changes. Spleen: Normal in size without focal abnormality. Adrenals/Urinary Tract: Normal adrenal glands. Stable 2 cm left renal cyst. Kidneys otherwise normal. No urolithiasis or obstructive uropathy. Normal bladder. Stomach/Bowel: No bowel dilatation to suggest bowel obstruction. Gastric air-fluid level with wall thickening of the antrum and  proximal duodenum which may reflect an element of gastric outlet obstruction secondary to adjacent inflammatory changes. Partial ascending colectomy a with adjacent post surgical changes. Large amount of stool throughout the colon. Normal appendix. Stable 22 x 15 mm complex fluid collection along the antimesenteric aspect of the proximal ascending colon along the anterior abdominal wall most consistent with a small abscess. Vascular/Lymphatic: Normal caliber abdominal aorta with mild atherosclerosis. No lymphadenopathy. Reproductive: Prostate is unremarkable. Other: No abdominal wall hernia or abnormality. No abdominopelvic ascites. Postsurgical changes in the right anterior abdominal wall. 18 x 15 mm complex fluid in the anterior abdominal wall subcutaneous fat along the surgical site likely reflecting a small hematoma. Musculoskeletal: No acute osseous abnormality. No aggressive osseous lesion. Degenerative disease with disc height loss and facet arthropathy at L2-3, L3-4, L4-5 and L5-S1. Bilateral foraminal stenosis at L4-5 and L5-S1. IMPRESSION: 1. Interval development of air within the intrahepatic biliary collecting system 3.5 x 4.2 cm with mild dilatation of the central biliary collecting system may reflect developing cholangitis in the absence of recent instrumentation. 2. 3.5 x 4.2 cm complex fluid collection in the gallbladder fossa with the overall size unchanged, but there is increase in the amount of fluid within the collection most consistent with an abscess. Mild surrounding inflammatory changes. 3. Stable 22 x 15 mm complex fluid collection along the antimesenteric aspect of the proximal ascending colon along the anterior abdominal wall most consistent with a small abscess. 4. Gastric air-fluid level with wall thickening of the antrum and proximal duodenum which may reflect an element of gastric outlet obstruction secondary to adjacent inflammatory changes. 5.  Aortic Atherosclerosis (ICD10-I70.0).  6. Large amount of stool throughout the colon. 7. Postsurgical changes the site partial ascending colectomy. Electronically Signed   By: Kathreen Devoid   On: 05/18/2019 11:47   Ct Abdomen Pelvis W Contrast  Result Date: 05/15/2019 CLINICAL DATA:  Diarrhea, no pain EXAM: CT ABDOMEN AND PELVIS WITH CONTRAST TECHNIQUE: Multidetector CT imaging of the abdomen and pelvis was  performed using the standard protocol following bolus administration of intravenous contrast. CONTRAST:  184mL OMNIPAQUE IOHEXOL 300 MG/ML  SOLN COMPARISON:  04/19/2019 FINDINGS: Lower chest: Small right pleural effusion. Trace left pleural effusion. Right basilar atelectasis. Right gynecomastia again noted. Hepatobiliary: No focal hepatic mass. No intrahepatic fluid collection. No interval change in minimal intrahepatic biliary duct dilatation centrally. Interval cholecystectomy. 3.5 x 4.6 cm complex fluid collection in the gallbladder fossa with an air-fluid level concerning for an abscess. Pancreas: Unremarkable. No pancreatic ductal dilatation or surrounding inflammatory changes. Spleen: Normal in size without focal abnormality. Adrenals/Urinary Tract: Normal adrenal glands. Stable 2 cm left renal cyst. Kidneys otherwise normal. No urolithiasis or obstructive uropathy. Normal bladder. Stomach/Bowel: No bowel dilatation to suggest bowel obstruction. Gastric air-fluid level with wall thickening of the antrum and proximal duodenum which may reflect an element of gastric outlet obstruction. Bowel wall thickening and surrounding inflammatory changes around the proximal ascending colon concerning for mild colitis. Normal appendix. 2.8 cm fluid collection along the anti mesenteric aspect of the proximal ascending colon along the anterior abdominal wall most consistent with an abscess. Vascular/Lymphatic: Normal caliber abdominal aorta with mild atherosclerosis. No lymphadenopathy. Reproductive: Prostate is unremarkable. Other: No abdominal wall hernia  or abnormality. No abdominopelvic ascites. Postsurgical changes in the subcutaneous fat of the right anterior abdominal wall. Musculoskeletal: No acute osseous abnormality. No aggressive osseous lesion. Degenerative disease with disc height loss at L2-3, L3-4 L4-5, L5-S1. IMPRESSION: 1. Interval cholecystectomy. 3.5 x 4.6 cm complex fluid collection in the gallbladder fossa with an air-fluid level concerning for an abscess. No CT evidence of cholangitis. 2. Gastric air-fluid level with reactive wall thickening of the antrum and proximal duodenum which may reflect an element of gastric outlet obstruction. Bowel wall thickening and surrounding inflammatory changes around the proximal ascending colon concerning for mild colitis. 2.8 cm fluid collection along the anti mesenteric aspect of the proximal ascending colon along the anterior abdominal wall most consistent with an abscess. 3. Small bilateral pleural effusions. Electronically Signed   By: Kathreen Devoid   On: 05/15/2019 12:16   Ct Abdomen Pelvis W Contrast  Result Date: 04/19/2019 CLINICAL DATA:  Rectal bleeding and anemia. EXAM: CT ABDOMEN AND PELVIS WITH CONTRAST TECHNIQUE: Multidetector CT imaging of the abdomen and pelvis was performed using the standard protocol following bolus administration of intravenous contrast. CONTRAST:  153mL OMNIPAQUE IOHEXOL 300 MG/ML  SOLN COMPARISON:  CT dated 10/22/2016. FINDINGS: Lower chest: There is bilateral gynecomastia. The heart is enlarged. Coronary artery calcifications are noted. Hepatobiliary: The liver is unremarkable. The gallbladder is diffusely abnormal with multiple pockets of gas and hyperdense material. The gallbladder wall does not appear significantly thickened. The common bile duct is mildly dilated. There appears to be a fistula tract from the gallbladder to the hepatic flexure of the colon. This can be best visualized on the coronal and sagittal series. An old cholecystostomy tube drain track is noted  coursing towards the anterior abdominal wall. Pancreas: Unremarkable. No pancreatic ductal dilatation or surrounding inflammatory changes. Spleen: Normal in size without focal abnormality. Adrenals/Urinary Tract: The left kidney is unremarkable aside from a small cyst arising from the lower pole. There is a slightly complex 2.3 cm cystic structure in the upper pole the right kidney. Both adrenal glands are unremarkable. There is no hydronephrosis. The urinary bladder is significantly distended with layering hyperdense material within the dependent portion, likely related to prior contrast injection. Stomach/Bowel: There are scattered colonic diverticula without CT evidence of diverticulitis. There are  air-fluid levels in the colon which are likely related to prior colonoscopy prep. The appendix is unremarkable. The stomach is grossly unremarkable. There is no evidence of a small-bowel obstruction. Vascular/Lymphatic: Aortic atherosclerosis. No enlarged abdominal or pelvic lymph nodes. Reproductive: The prostate gland is significantly enlarged. Other: No abdominal wall hernia or abnormality. No abdominopelvic ascites. Musculoskeletal: Multilevel degenerative changes are noted of the thoracolumbar spine. IMPRESSION: 1. Findings highly suspicious for a fistula track between the gallbladder lumen and hepatic flexure of the colon. There are multiple pockets of gas and debris within the gallbladder lumen. There is no definite CT evidence of acute cholecystitis. 2. Moderate dilatation of the common bile duct. Correlation with laboratory studies is recommended to help exclude an obstructing process. 3. Significantly distended urinary bladder. 4. No specific abnormality identified to explain the patient's painless hematochezia. 5. Cardiomegaly.  Bilateral gynecomastia is noted. Electronically Signed   By: Constance Holster M.D.   On: 04/19/2019 21:41   US Renal  Result Date: 04/28/2019 CLINICAL DATA:  Acute renal  insufficiency. Patient currently admitted. Diabetes. EXAM: RENAL / URINARY TRACT ULTRASOUND COMPLETE COMPARISON:  CT 04/19/2019 FINDINGS: Right Kidney: Renal measurements: 10.8 x 5.5 x 6.8 cm = volume: 210 mL . Echogenicity within normal limits. No mass or hydronephrosis visualized. 2.3 cm upper pole cyst. Left Kidney: Renal measurements: 9.0 x 6.0 x 5.1 cm = volume: 144 mL. Echogenicity within normal limits. No mass or hydronephrosis visualized. 2.6 cm midpole cyst and 2.5 cm upper pole cyst. Bladder: Appears normal for degree of bladder distention. IMPRESSION: Normal size kidneys without hydronephrosis.  Bilateral renal cysts. Electronically Signed   By: Marin Olp M.D.   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 Now with rim enhancing fluid collection in the gallbladder fossa on recent CT. EXAM: MRI ABDOMEN WITHOUT AND WITH CONTRAST (INCLUDING MRCP) TECHNIQUE: Multiplanar multisequence MR imaging of the abdomen was performed both before and after the administration of intravenous contrast. Heavily T2-weighted images of the biliary and pancreatic ducts were obtained, and three-dimensional MRCP images were rendered by post processing. CONTRAST:  10 cc Gadavist COMPARISON:  CT scan earlier today. FINDINGS: Exam is markedly degraded by motion secondary to patient inability to reproducibly breath hold. Lower chest: Small right and tiny left pleural effusion. Dependent atelectasis noted right lower lobe. Hepatobiliary: No gross focal enhancing lesion within the liver. Mild intrahepatic biliary duct dilatation is associated with extrahepatic common duct measuring up to 6 mm diameter. Areas of signal void within the hepatic ductal anatomy compatible with gas as seen on CT earlier today. 3.5 by 3.6 cm fluid collection with gas identified in the gallbladder fossa.  Pancreas: Nondiagnostic imaging due to motion artifact. No gross main pancreatic ductal dilatation. Spleen:  No gross abnormality within the spleen. Adrenals/Urinary Tract: No adrenal nodule or mass. 2.7 cm cystic lesion in the upper pole right kidney with some probable septation along the internal wall. This is contiguous with and possibly insinuating around a upper pole collecting system, but enhancing mural nodule not completely excluded. 2.7 cm simple cyst noted lower pole left kidney. Renal assessment limited by the substantial motion artifact and tiny renal lesions identified on today's study cannot be definitively characterized. Stomach/Bowel: Moderate gastric distention. Fine detail of bowel loops in the abdomen obscured by motion artifact, but no gross small bowel or colonic dilatation within the visualized  abdomen. Vascular/Lymphatic: No evidence for abdominal aortic aneurysm. No abdominal lymphadenopathy evident. Other:  No intraperitoneal free fluid. Musculoskeletal: No abnormal marrow enhancement within the visualized bony anatomy. IMPRESSION: 1. 3.5 x 3.6 cm rim enhancing fluid collection identified in the gallbladder fossa. Imaging features remain compatible with abscess. 2. Mild prominence of intrahepatic bile ducts without substantial extrahepatic biliary duct dilatation. Areas of signal void within the hepatic ducts compatible with pneumobilia. 3. 2.7 cm cystic lesion upper pole right kidney with some internal septation along the inferior margin. There may be an enhancing mural nodule or this cyst may insinuate around a renal calyx. Repeat MRI in 3-6 months recommended to ensure stability a cystic renal cell neoplasm not excluded. 4. Small bilateral pleural effusions, right greater than left. Electronically Signed   By: Misty Stanley M.D.   On: 05/18/2019 15:33   Dg Chest Port 1 View  Result Date: 04/30/2019 CLINICAL DATA:  Rectal bleeding.  Open cholecystectomy April 23, 2019. EXAM: PORTABLE  CHEST 1 VIEW COMPARISON:  April 27, 2019 FINDINGS: Low lung volumes limit evaluation. Opacity remains in the right base, likely a small effusion with atelectasis. Infiltrate not excluded. No change in the cardiomediastinal silhouette. IMPRESSION: Mild opacity in the right base may represent a small effusion with atelectasis versus infiltrate. Recommend follow-up to resolution. Electronically Signed   By: Dorise Bullion III M.D   On: 04/30/2019 14:22   Dg Chest Port 1 View  Result Date: 04/27/2019 CLINICAL DATA:  Fever. EXAM: PORTABLE CHEST 1 VIEW COMPARISON:  10/21/2016. FINDINGS: Again demonstrated is a very poor inspiration. Mild-to-moderate bibasilar atelectasis. Mild peribronchial thickening. Stable borderline enlarged cardiac silhouette. Unremarkable bones. IMPRESSION: 1. Poor inspiration with mild to moderate bibasilar atelectasis. 2. Mild bronchitic changes. Electronically Signed   By: Claudie Revering M.D.   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 Now with rim enhancing fluid collection in the gallbladder fossa on recent CT. EXAM: MRI ABDOMEN WITHOUT AND WITH CONTRAST (INCLUDING MRCP) TECHNIQUE: Multiplanar multisequence MR imaging of the abdomen was performed both before and after the administration of intravenous contrast. Heavily T2-weighted images of the biliary and pancreatic ducts were obtained, and three-dimensional MRCP images were rendered by post processing. CONTRAST:  10 cc Gadavist COMPARISON:  CT scan earlier today. FINDINGS: Exam is markedly degraded by motion secondary to patient inability to reproducibly breath hold. Lower chest: Small right and tiny left pleural effusion. Dependent atelectasis noted right lower lobe. Hepatobiliary: No gross focal enhancing lesion within the liver. Mild intrahepatic biliary duct dilatation is associated  with extrahepatic common duct measuring up to 6 mm diameter. Areas of signal void within the hepatic ductal anatomy compatible with gas as seen on CT earlier today. 3.5 by 3.6 cm fluid collection with gas identified in the gallbladder fossa. Pancreas: Nondiagnostic imaging due to motion artifact. No gross main pancreatic ductal dilatation. Spleen:  No gross abnormality within the spleen. Adrenals/Urinary Tract: No adrenal nodule or mass. 2.7 cm cystic lesion in the upper pole right kidney with some probable septation along the internal wall. This is contiguous with and possibly insinuating around a upper pole collecting system, but enhancing mural nodule not completely excluded. 2.7 cm simple cyst noted lower pole left kidney. Renal assessment limited by the substantial motion artifact and tiny renal lesions identified on today's study cannot be definitively characterized. Stomach/Bowel: Moderate gastric  distention. Fine detail of bowel loops in the abdomen obscured by motion artifact, but no gross small bowel or colonic dilatation within the visualized abdomen. Vascular/Lymphatic: No evidence for abdominal aortic aneurysm. No abdominal lymphadenopathy evident. Other:  No intraperitoneal free fluid. Musculoskeletal: No abnormal marrow enhancement within the visualized bony anatomy. IMPRESSION: 1. 3.5 x 3.6 cm rim enhancing fluid collection identified in the gallbladder fossa. Imaging features remain compatible with abscess. 2. Mild prominence of intrahepatic bile ducts without substantial extrahepatic biliary duct dilatation. Areas of signal void within the hepatic ducts compatible with pneumobilia. 3. 2.7 cm cystic lesion upper pole right kidney with some internal septation along the inferior margin. There may be an enhancing mural nodule or this cyst may insinuate around a renal calyx. Repeat MRI in 3-6 months recommended to ensure stability a cystic renal cell neoplasm not excluded. 4. Small bilateral pleural  effusions, right greater than left. Electronically Signed   By: Misty Stanley M.D.   On: 05/18/2019 15:33   US Abdomen Limited Ruq  Result Date: 05/15/2019 CLINICAL DATA:  Recent cholecystectomy.  Elevated liver enzymes EXAM: ULTRASOUND ABDOMEN LIMITED RIGHT UPPER QUADRANT COMPARISON:  CT abdomen and pelvis Apr 19, 2019 FINDINGS: Gallbladder: Surgically absent. No lesions seen in the gallbladder fossa by ultrasound. Common bile duct: Diameter: 10 mm which is upper normal in size for post cholecystectomy state. No appreciable intrahepatic biliary duct dilatation evident. Note that portions of the common bile duct are obscured by gas. Liver: Echogenic foci which shadow are noted in the liver, likely with in intrahepatic ducts. Appearance suggests air in these areas. No liver mass lesions are evident. Within normal limits in parenchymal echogenicity. Portal vein is patent on color Doppler imaging with normal direction of blood flow towards the liver. There is a small right pleural effusion. IMPRESSION: 1. Suspect foci of air within intrahepatic biliary ducts. This finding raises concern for potential degree of cholangitis. The common bile duct is upper normal in size for post cholecystectomy state. Note that portions of the common bile duct are not seen. Given these concerns, CT or MR, ideally with intravenous contrast, advised to further assess. 2. Gallbladder absent. No lesions seen by ultrasound in the gallbladder fossa region. 3.  Small right pleural effusion. These results will be called to the ordering clinician or representative by the Radiologist Assistant, and communication documented in the PACS or zVision Dashboard. Electronically Signed   By: Lowella Grip III M.D.   On: 05/15/2019 09:15     CBC Recent Labs  Lab 05/14/19 0831 05/15/19 1011 05/16/19 0603 05/17/19 0525 05/18/19 0820  WBC 14.4* 17.6* 16.6* 15.0* 12.6*  HGB 8.7* 9.4* 8.7* 8.6* 8.6*  HCT 28.2* 30.3* 28.7* 28.5* 28.1*  PLT  749* 797* 648* 568* 495*  MCV 92.5 92.4 92.9 93.1 91.8  MCH 28.5 28.7 28.2 28.1 28.1  MCHC 30.9 31.0 30.3 30.2 30.6  RDW 14.7 14.6 14.6 14.7 14.9  LYMPHSABS  --  4.1*  --   --   --   MONOABS  --  0.9  --   --   --   EOSABS  --  0.4  --   --   --   BASOSABS  --  0.1  --   --   --     Chemistries  Recent Labs  Lab 05/14/19 0831 05/15/19 1011 05/16/19 0603 05/17/19 0525 05/18/19 0820  NA 138 138 140 139 139  K 4.6 4.6 4.4 4.3 4.2  CL 100 100 104 101  104  CO2 25 23 27 25 25   GLUCOSE 195* 220* 168* 179* 201*  BUN 26* 29* 21 21 15   CREATININE 1.41* 1.38* 1.40* 1.44* 1.25*  CALCIUM 8.0* 8.4* 8.2* 8.2* 8.2*  AST 107* 92* 68* 57* 48*  ALT 159* 149* 111* 102* 92*  ALKPHOS 101 108 94 94 104  BILITOT 0.2* 0.5 0.4 0.4 0.2*   ------------------------------------------------------------------------------------------------------------------ No results for input(s): CHOL, HDL, LDLCALC, TRIG, CHOLHDL, LDLDIRECT in the last 72 hours.  Lab Results  Component Value Date   HGBA1C 8.4 (H) 10/14/2016   ------------------------------------------------------------------------------------------------------------------ No results for input(s): TSH, T4TOTAL, T3FREE, THYROIDAB in the last 72 hours.  Invalid input(s): FREET3 ------------------------------------------------------------------------------------------------------------------ No results for input(s): VITAMINB12, FOLATE, FERRITIN, TIBC, IRON, RETICCTPCT in the last 72 hours.  Coagulation profile Recent Labs  Lab 05/15/19 1011  INR 1.1    No results for input(s): DDIMER in the last 72 hours.  Cardiac Enzymes No results for input(s): CKMB, TROPONINI, MYOGLOBIN in the last 168 hours.  Invalid input(s): CK ------------------------------------------------------------------------------------------------------------------    Component Value Date/Time   BNP 62.7 10/12/2016 1707     Niyah Mamaril M.D on 05/18/2019 at 6:09  PM  Go to [email or web address] - for contact info  [facility] - Office  [phone]

## 2019-05-19 ENCOUNTER — Inpatient Hospital Stay (HOSPITAL_COMMUNITY): Payer: Medicare HMO

## 2019-05-19 LAB — CBC
HCT: 28.8 % — ABNORMAL LOW (ref 39.0–52.0)
Hemoglobin: 8.9 g/dL — ABNORMAL LOW (ref 13.0–17.0)
MCH: 28.2 pg (ref 26.0–34.0)
MCHC: 30.9 g/dL (ref 30.0–36.0)
MCV: 91.1 fL (ref 80.0–100.0)
Platelets: 464 10*3/uL — ABNORMAL HIGH (ref 150–400)
RBC: 3.16 MIL/uL — ABNORMAL LOW (ref 4.22–5.81)
RDW: 14.9 % (ref 11.5–15.5)
WBC: 12.8 10*3/uL — ABNORMAL HIGH (ref 4.0–10.5)
nRBC: 0 % (ref 0.0–0.2)

## 2019-05-19 LAB — COMPREHENSIVE METABOLIC PANEL
ALT: 75 U/L — ABNORMAL HIGH (ref 0–44)
AST: 36 U/L (ref 15–41)
Albumin: 2.7 g/dL — ABNORMAL LOW (ref 3.5–5.0)
Alkaline Phosphatase: 100 U/L (ref 38–126)
Anion gap: 12 (ref 5–15)
BUN: 11 mg/dL (ref 8–23)
CO2: 26 mmol/L (ref 22–32)
Calcium: 8.7 mg/dL — ABNORMAL LOW (ref 8.9–10.3)
Chloride: 101 mmol/L (ref 98–111)
Creatinine, Ser: 1.23 mg/dL (ref 0.61–1.24)
GFR calc Af Amer: >60 mL/min (ref >60)
GFR calc non Af Amer: 55 mL/min — ABNORMAL LOW (ref >60)
Glucose, Bld: 181 mg/dL — ABNORMAL HIGH (ref 70–99)
Potassium: 4.1 mmol/L (ref 3.5–5.1)
Sodium: 139 mmol/L (ref 135–145)
Total Bilirubin: 0.5 mg/dL (ref 0.3–1.2)
Total Protein: 6.8 g/dL (ref 6.5–8.1)

## 2019-05-19 LAB — GLUCOSE, CAPILLARY
Glucose-Capillary: 136 mg/dL — ABNORMAL HIGH (ref 70–99)
Glucose-Capillary: 156 mg/dL — ABNORMAL HIGH (ref 70–99)
Glucose-Capillary: 162 mg/dL — ABNORMAL HIGH (ref 70–99)
Glucose-Capillary: 162 mg/dL — ABNORMAL HIGH (ref 70–99)
Glucose-Capillary: 169 mg/dL — ABNORMAL HIGH (ref 70–99)
Glucose-Capillary: 213 mg/dL — ABNORMAL HIGH (ref 70–99)
Glucose-Capillary: 216 mg/dL — ABNORMAL HIGH (ref 70–99)

## 2019-05-19 MED ORDER — ISOSORBIDE MONONITRATE ER 60 MG PO TB24
60.0000 mg | ORAL_TABLET | Freq: Every day | ORAL | Status: DC
  Administered 2019-05-20 – 2019-05-21 (×2): 60 mg via ORAL
  Filled 2019-05-19 (×2): qty 1

## 2019-05-19 MED ORDER — VERAPAMIL HCL ER 180 MG PO TBCR: 180.0000 mg | EXTENDED_RELEASE_TABLET | Freq: Every day | ORAL | Status: DC

## 2019-05-19 MED ORDER — HYDRALAZINE HCL 50 MG PO TABS
100.0000 mg | ORAL_TABLET | Freq: Three times a day (TID) | ORAL | Status: DC
  Administered 2019-05-19 – 2019-05-20 (×5): 100 mg via ORAL
  Filled 2019-05-19 (×5): qty 2

## 2019-05-19 MED ORDER — TECHNETIUM TC 99M MEBROFENIN IV KIT
5.0000 | PACK | Freq: Once | INTRAVENOUS | Status: AC | PRN
  Administered 2019-05-19: 5 via INTRAVENOUS

## 2019-05-19 MED ORDER — VERAPAMIL HCL ER 240 MG PO TBCR
240.0000 mg | EXTENDED_RELEASE_TABLET | Freq: Every day | ORAL | Status: DC
  Administered 2019-05-20 – 2019-05-21 (×2): 240 mg via ORAL
  Filled 2019-05-19 (×2): qty 1

## 2019-05-19 MED ORDER — ISOSORBIDE MONONITRATE ER 30 MG PO TB24
30.0000 mg | ORAL_TABLET | Freq: Every day | ORAL | Status: DC
  Administered 2019-05-19: 30 mg via ORAL
  Filled 2019-05-19: qty 1

## 2019-05-19 NOTE — Plan of Care (Signed)
  Problem: Pain Managment: Goal: General experience of comfort will improve Outcome: Not Progressing   Problem: Safety: Goal: Ability to remain free from injury will improve Outcome: Not Progressing   Problem: Skin Integrity: Goal: Risk for impaired skin integrity will decrease Outcome: Not Progressing   

## 2019-05-19 NOTE — Progress Notes (Signed)
Pharmacy Antibiotic Note  Stephen HYDE Sr. is a 78 y.o. male admitted on 05/15/2019 with intra abdominal infection.  Pharmacy has been consulted for zosyn dosing. repeat CT findings concerning for an occult bile leak. Plan for HIDA scan  Plan: Continue Zosyn 3.375g IV q8h (4 hour infusion).  F/U cxs and clinical progree Monitor V/S, labs   Height: 5\' 6"  (167.6 cm) Weight: 199 lb 4.7 oz (90.4 kg) IBW/kg (Calculated) : 63.8  Temp (24hrs), Avg:98.2 F (36.8 C), Min:98.1 F (36.7 C), Max:98.4 F (36.9 C)  Recent Labs  Lab 05/15/19 1011 05/15/19 1305 05/15/19 1457 05/16/19 0603 05/17/19 0525 05/18/19 0820 05/19/19 0820  WBC 17.6*  --   --  16.6* 15.0* 12.6* 12.8*  CREATININE 1.38*  --   --  1.40* 1.44* 1.25* 1.23  LATICACIDVEN  --  2.1* 1.9  --   --   --   --     Estimated Creatinine Clearance: 51.2 mL/min (by C-G formula based on SCr of 1.23 mg/dL).    No Known Allergies  Antimicrobials this admission: 6/23 zosyn >>   Microbiology results: 6/23 BCx: coag neg staph 1/2, likely contaminant 6/23 Covid 19 negative  6/23 MRSA PCR is negative  Thank you for allowing pharmacy to be a part of this patient's care.  Isac Sarna, BS Pharm D, [place] Clinical Pharmacist Pager [phone] 05/19/2019 10:43 AM

## 2019-05-19 NOTE — Progress Notes (Signed)
Report called to [place] at [facility].  Patient transported by [facility] to [id number]

## 2019-05-19 NOTE — Progress Notes (Signed)
Patient Demographics:    Stephen Rogers, is a 78 y.o. male, DOB - 09-Oct-1941, AJG:[id number]  Admit date - 05/15/2019   Admitting Physician Bethena Roys, MD  Outpatient Primary MD for the patient is [name], MD  LOS - 4   Chief Complaint  Patient presents with   Abnormal Lab        Subjective:    Stephen Rogers today has no fevers, no emesis,  No chest pain, no chills,  , per patient no significant abdominal pain at this time, no shortness of breath  Assessment  & Plan :    Principal Problem:   Intra-abdominal abscess ([place]) Active Problems:   Type 2 diabetes mellitus, controlled, with renal complications ([place])   Hyperlipemia   CKD (chronic kidney disease), stage III ([place])   Anemia   Hypertension associated with stage 3 chronic kidney disease due to type 2 diabetes mellitus [facility])  Brief Summary 78 y.o. male with medical history significant for DM2, HTN, CKD3, GI bleed, DVT- 2017 who is status post open cholecystectomy and partial colectomy for Chole-colo-fistula--- S/p surgery on 04/23/2019-- was Readmitted on 05/15/2019 with abdominal pain and finding of intra-abdominal abscess, general surgeon recommends conservative management with iv zosyn for now-  repeat CT abdomen and pelvis on 05/18/2019 and MRCP on 05/18/2019 concerning for abscess, pneumobilia, and biliary leak --discussed with Dr. Laurence Spates from [facility] --patient may need ERCP with stent placement --transfer to [employer]- on 05/19/19   A/p  1) intra-abdominal abscess---status post open cholecystectomy and partial colectomy for Chole-colo-fistula on 04/23/2019--- ---Per IR abscess too small to drain --Discussed with general surgeon Dr. [name], tolerating oral intake, WBC down to 12.8 , continue IV Zosyn, blood cultures from 05/15/2019 with coagulase-negative staph in 1 out of 2 bottles most likely contaminant LFTs  trending down- ALT is 75, AST is 36, --Repeat CT abdomen and Pelvis on 05/18/2019 and MRCP on 05/18/2019 concerning for abscess, pneumobilia, and possible biliary leak --discussed with Dr. Laurence Spates from [facility] --patient may need ERCP with stent placement --HIDA scan is not available at [facility], will transfer to [place]- on 05/19/19  2)DM2--no recent A1c available, continue with sliding scale coverage  3)HTN--BP is not at goal, add isosorbide mononitrate 30 mg daily, increase hydralazine 100 mg 3 times daily,  Verapamil was increased to 180 mg from 120 mg daily, -  may use IV labetalol when necessary  Every 4 hours for systolic blood pressure over 160 mmhg  4)HLD-- stable, continue atorvastatin and aspirin as ordered.... Continue to monitor LFTs and discontinue atorvastatin if LFTs more than 2.5 to 3 times normal range  5)CKD III-creatinine is 1.23 which is actually lower than patient's recent baseline, avoid nephrotoxic agent  6) chronic anemia--- multifactorial,, hemoglobin is currently 8.9 (stable)  which is close to patient's recent baseline no evidence of acute bleeding at this time  7) constipation--- bowel regimen as ordered  8)Generalized weakness--- PT recommends SNF rehab  Code Status : Full  Family Communication: Left messages for both  daughter at [phone] and sister at [phone]  (pt is awake and coherent) Pt was able to speak to his sister himself  Disposition Plan  :  [facility] for  GI evaluation and possible ERCP  Consults  :  gen surgey/[facility]  DVT Prophylaxis  :   - Heparin - SCDs    Lab Results  Component Value Date   PLT 464 (H) 05/19/2019    Inpatient Medications  Scheduled Meds:  aspirin EC  81 mg Oral Daily   atorvastatin  40 mg Oral q1800   bisacodyl  10 mg Rectal Once   feeding supplement  1 Container Oral TID BM   feeding supplement (ENSURE ENLIVE)  237 mL Oral BID BM   heparin injection (subcutaneous)  5,000  Units Subcutaneous Q8H   hydrALAZINE  50 mg Oral TID   insulin aspart  0-9 Units Subcutaneous Q4H   lactulose  30 g Oral Once   pantoprazole  40 mg Oral Daily   polyethylene glycol  17 g Oral BID   verapamil  120 mg Oral Daily   Continuous Infusions:  piperacillin-tazobactam (ZOSYN)  IV 3.375 g (05/19/19 0655)   PRN Meds:.acetaminophen **OR** acetaminophen, labetalol, ondansetron **OR** ondansetron (ZOFRAN) IV    Anti-infectives (From admission, onward)   Start     Dose/Rate Route Frequency Ordered Stop   05/15/19 2100  piperacillin-tazobactam (ZOSYN) IVPB 3.375 g     3.375 g 12.5 mL/hr over 240 Minutes Intravenous Every 8 hours 05/15/19 1554     05/15/19 1315  piperacillin-tazobactam (ZOSYN) IVPB 3.375 g     3.375 g 100 mL/hr over 30 Minutes Intravenous  Once 05/15/19 1304 05/15/19 1343        Objective:   Vitals:   05/18/19 1416 05/18/19 2131 05/19/19 0531 05/19/19 0840  BP: (!) 160/98 (!) 154/90 (!) 155/93   Pulse: (!) 102 (!) 103 100   Resp: 18 17 16    Temp: 98.4 F (36.9 C) 98.2 F (36.8 C) 98.1 F (36.7 C)   TempSrc: Oral     SpO2:  98% 95% 95%  Weight:      Height:        Wt Readings from Last 3 Encounters:  05/15/19 90.4 kg  05/15/19 90.9 kg  05/14/19 100.2 kg     Intake/Output Summary (Last 24 hours) at 05/19/2019 1041 Last data filed at 05/19/2019 0300 Gross per 24 hour  Intake 628.77 ml  Output 600 ml  Net 28.77 ml   Physical Exam   Gen:- Awake Alert,  In no apparent distress  HEENT:- [place].AT, No sclera icterus Neck-Supple Neck,No JVD,.  Lungs-  CTAB , fair symmetrical air movement CV- S1, S2 normal, regular  Abd-  +ve B.Sounds, Abd Soft, No significant tenderness, no CVA tenderness Extremity/Skin:- No  edema, pedal pulses present  Psych-affect is appropriate, oriented x3 Neuro-generalized weakness , no new focal deficits, no tremors   Data Review:   Micro Results Recent Results (from the past 240 hour(s))  SARS Coronavirus 2  (CEPHEID - Performed in [place] hospital lab), Hosp Order     Status: None   Collection Time: 05/15/19 12:28 PM   Specimen: Nasopharyngeal Swab  Result Value Ref Range Status   SARS Coronavirus 2 NEGATIVE NEGATIVE Final    Comment: (NOTE) If result is NEGATIVE SARS-CoV-2 target nucleic acids are NOT DETECTED. The SARS-CoV-2 RNA is generally detectable in upper and lower  respiratory specimens during the acute phase of infection. The lowest  concentration of SARS-CoV-2 viral copies this assay can detect is 250  copies / mL. A negative result does not preclude SARS-CoV-2 infection  and should not be used as the sole basis for treatment  or other  patient management decisions.  A negative result may occur with  improper specimen collection / handling, submission of specimen other  than nasopharyngeal swab, presence of viral mutation(s) within the  areas targeted by this assay, and inadequate number of viral copies  (<250 copies / mL). A negative result must be combined with clinical  observations, patient history, and epidemiological information. If result is POSITIVE SARS-CoV-2 target nucleic acids are DETECTED. The SARS-CoV-2 RNA is generally detectable in upper and lower  respiratory specimens dur ing the acute phase of infection.  Positive  results are indicative of active infection with SARS-CoV-2.  Clinical  correlation with patient history and other diagnostic information is  necessary to determine patient infection status.  Positive results do  not rule out bacterial infection or co-infection with other viruses. If result is PRESUMPTIVE POSTIVE SARS-CoV-2 nucleic acids MAY BE PRESENT.   A presumptive positive result was obtained on the submitted specimen  and confirmed on repeat testing.  While 2019 novel coronavirus  (SARS-CoV-2) nucleic acids may be present in the submitted sample  additional confirmatory testing may be necessary for epidemiological  and / or clinical  management purposes  to differentiate between  SARS-CoV-2 and other Sarbecovirus currently known to infect humans.  If clinically indicated additional testing with an alternate test  methodology [phone]) is advised. The SARS-CoV-2 RNA is generally  detectable in upper and lower respiratory sp ecimens during the acute  phase of infection. The expected result is Negative. Fact Sheet for Patients:  StrictlyIdeas.no Fact Sheet for Healthcare Providers: BankingDealers.co.za This test is not yet approved or cleared by the [place] FDA and has been authorized for detection and/or diagnosis of SARS-CoV-2 by FDA under an Emergency Use Authorization (EUA).  This EUA will remain in effect (meaning this test can be used) for the duration of the COVID-19 declaration under Section 564(b)(1) of the Act, 21 U.S.C. section 360bbb-3(b)(1), unless the authorization is terminated or revoked sooner. Performed at [facility], [address] [place], [place] [address]   Blood culture (routine x 2)     Status: None (Preliminary result)   Collection Time: 05/15/19 12:59 PM   Specimen: BLOOD RIGHT HAND  Result Value Ref Range Status   Specimen Description BLOOD RIGHT HAND  Final   Special Requests   Final    BOTTLES DRAWN AEROBIC AND ANAEROBIC Blood Culture results may not be optimal due to an inadequate volume of blood received in culture bottles   Culture   Final    NO GROWTH 4 DAYS Performed at [facility], [address] [place], [place] [address]    Report Status PENDING  Incomplete  Blood culture (routine x 2)     Status: Abnormal   Collection Time: 05/15/19  1:05 PM   Specimen: BLOOD RIGHT ARM  Result Value Ref Range Status   Specimen Description   Final    BLOOD RIGHT ARM Performed at [facility], [address], [place], [place] [address]    Special Requests   Final    BOTTLES DRAWN AEROBIC AND ANAEROBIC Blood Culture results may not be  optimal due to an inadequate volume of blood received in culture bottles Performed at [facility], [address] [place], [place] [address]    Culture  Setup Time   Final    GRAM POSITIVE COCCI Gram Stain Report Called to,Read Back By and Verified With: BENSON,D. AT 1358 ON 05/16/2019 BY EVA ANAEROBIC BOTTLE ONLY Performed at [facility]  CRITICAL RESULT CALLED TO, READ BACK BY AND VERIFIED WITH: D BENSON RN 05/16/19 1756 JDW    Culture (A)  Final    STAPHYLOCOCCUS SPECIES (COAGULASE NEGATIVE) THE SIGNIFICANCE OF ISOLATING THIS ORGANISM FROM A SINGLE SET OF BLOOD CULTURES WHEN MULTIPLE SETS ARE DRAWN IS UNCERTAIN. PLEASE NOTIFY THE MICROBIOLOGY DEPARTMENT WITHIN ONE WEEK IF SPECIATION AND SENSITIVITIES ARE REQUIRED. Performed at [facility], [place] [address], [place], [place] [address]    Report Status 05/18/2019 FINAL  Final  Blood Culture ID Panel (Reflexed)     Status: Abnormal   Collection Time: 05/15/19  1:05 PM  Result Value Ref Range Status   Enterococcus species NOT DETECTED NOT DETECTED Final   Listeria monocytogenes NOT DETECTED NOT DETECTED Final   Staphylococcus species DETECTED (A) NOT DETECTED Final    Comment: Methicillin (oxacillin) resistant coagulase negative staphylococcus. Possible blood culture contaminant (unless isolated from more than one blood culture draw or clinical case suggests pathogenicity). No antibiotic treatment is indicated for blood  culture contaminants. CRITICAL RESULT CALLED TO, READ BACK BY AND VERIFIED WITH: D BENSON RN 05/16/19 1756 JDW    Staphylococcus aureus (BCID) NOT DETECTED NOT DETECTED Final   Methicillin resistance DETECTED (A) NOT DETECTED Final    Comment: CRITICAL RESULT CALLED TO, READ BACK BY AND VERIFIED WITH: D BENSON RN 05/16/19 1756 JDW    Streptococcus species NOT DETECTED NOT DETECTED Final   Streptococcus agalactiae NOT DETECTED NOT DETECTED Final   Streptococcus pneumoniae NOT DETECTED NOT DETECTED Final    Streptococcus pyogenes NOT DETECTED NOT DETECTED Final   Acinetobacter baumannii NOT DETECTED NOT DETECTED Final   Enterobacteriaceae species NOT DETECTED NOT DETECTED Final   Enterobacter cloacae complex NOT DETECTED NOT DETECTED Final   Escherichia coli NOT DETECTED NOT DETECTED Final   Klebsiella oxytoca NOT DETECTED NOT DETECTED Final   Klebsiella pneumoniae NOT DETECTED NOT DETECTED Final   Proteus species NOT DETECTED NOT DETECTED Final   Serratia marcescens NOT DETECTED NOT DETECTED Final   Haemophilus influenzae NOT DETECTED NOT DETECTED Final   Neisseria meningitidis NOT DETECTED NOT DETECTED Final   Pseudomonas aeruginosa NOT DETECTED NOT DETECTED Final   Candida albicans NOT DETECTED NOT DETECTED Final   Candida glabrata NOT DETECTED NOT DETECTED Final   Candida krusei NOT DETECTED NOT DETECTED Final   Candida parapsilosis NOT DETECTED NOT DETECTED Final   Candida tropicalis NOT DETECTED NOT DETECTED Final    Comment: Performed at [facility], [place]. [address], [place], [place] [address]  MRSA PCR Screening     Status: None   Collection Time: 05/15/19  7:42 PM   Specimen: Nasal Mucosa; Nasopharyngeal  Result Value Ref Range Status   MRSA by PCR NEGATIVE NEGATIVE Final    Comment:        The GeneXpert MRSA Assay (FDA approved for NASAL specimens only), is one component of a comprehensive MRSA colonization surveillance program. It is not intended to diagnose MRSA infection nor to guide or monitor treatment for MRSA infections. Performed at [facility], [address], [place],  [address]     Radiology Reports Dg Knee 1-2 Views Left  Result Date: 04/29/2019 CLINICAL DATA:  Left knee pain and swelling. Fluid aspirated from knee today. Daughter. EXAM: LEFT KNEE - 1-2 VIEW COMPARISON:  None. FINDINGS: Mild to moderate tricompartmental osteoarthritic changes present. There is evidence of a small joint effusion. No evidence of fracture or dislocation. IMPRESSION:  No acute findings. Small joint effusion.  Mild  to moderate osteoarthritis. Electronically Signed   By: Marin Olp M.D.   On: 04/29/2019 18:17   Dg Abd 1 View  Result Date: 05/07/2019 CLINICAL DATA:  Abdominal distension, pain, nausea and vomiting. Patient status post cholecystectomy 04/23/2019. EXAM: ABDOMEN - 1 VIEW COMPARISON:  None. FINDINGS: The bowel gas pattern is nonobstructive. There is mild gaseous distention of the stomach and colon. Cholecystectomy clips noted. No unexpected abdominal calcification or acute bony abnormality. No radio-opaque calculi or other significant radiographic abnormality are seen. IMPRESSION: Nonobstructive bowel gas pattern. Mild distention of the stomach and colon may be due to ileus. Electronically Signed   By: Inge Rise M.D.   On: 05/07/2019 15:49   Ct Abdomen Pelvis W Contrast  Result Date: 05/18/2019 CLINICAL DATA:  Abdominal pain, status post cholecystectomy and partial colectomy. EXAM: CT ABDOMEN AND PELVIS WITH CONTRAST TECHNIQUE: Multidetector CT imaging of the abdomen and pelvis was performed using the standard protocol following bolus administration of intravenous contrast. CONTRAST:  54mL OMNIPAQUE IOHEXOL 300 MG/ML SOLN, 157mL OMNIPAQUE IOHEXOL 300 MG/ML SOLN COMPARISON:  05/15/2019, 04/19/2019 FINDINGS: Lower chest: Small right pleural effusion with basilar atelectasis. Trace left pleural effusion. Hepatobiliary: No focal mass. Interval development of air within the intrahepatic biliary collecting system 3.5 x 4.2 cm with mild dilatation of the central biliary collecting system may reflect developing cholangitis in the absence of recent instrumentation. Complex fluid collection in the gallbladder fossa with the overall size unchanged, but increase in the amount of fluid within the collection most consistent with an abscess. Mild surrounding inflammatory changes. Pancreas: Unremarkable. No pancreatic ductal dilatation or surrounding inflammatory  changes. Spleen: Normal in size without focal abnormality. Adrenals/Urinary Tract: Normal adrenal glands. Stable 2 cm left renal cyst. Kidneys otherwise normal. No urolithiasis or obstructive uropathy. Normal bladder. Stomach/Bowel: No bowel dilatation to suggest bowel obstruction. Gastric air-fluid level with wall thickening of the antrum and proximal duodenum which may reflect an element of gastric outlet obstruction secondary to adjacent inflammatory changes. Partial ascending colectomy a with adjacent post surgical changes. Large amount of stool throughout the colon. Normal appendix. Stable 22 x 15 mm complex fluid collection along the antimesenteric aspect of the proximal ascending colon along the anterior abdominal wall most consistent with a small abscess. Vascular/Lymphatic: Normal caliber abdominal aorta with mild atherosclerosis. No lymphadenopathy. Reproductive: Prostate is unremarkable. Other: No abdominal wall hernia or abnormality. No abdominopelvic ascites. Postsurgical changes in the right anterior abdominal wall. 18 x 15 mm complex fluid in the anterior abdominal wall subcutaneous fat along the surgical site likely reflecting a small hematoma. Musculoskeletal: No acute osseous abnormality. No aggressive osseous lesion. Degenerative disease with disc height loss and facet arthropathy at L2-3, L3-4, L4-5 and L5-S1. Bilateral foraminal stenosis at L4-5 and L5-S1. IMPRESSION: 1. Interval development of air within the intrahepatic biliary collecting system 3.5 x 4.2 cm with mild dilatation of the central biliary collecting system may reflect developing cholangitis in the absence of recent instrumentation. 2. 3.5 x 4.2 cm complex fluid collection in the gallbladder fossa with the overall size unchanged, but there is increase in the amount of fluid within the collection most consistent with an abscess. Mild surrounding inflammatory changes. 3. Stable 22 x 15 mm complex fluid collection along the  antimesenteric aspect of the proximal ascending colon along the anterior abdominal wall most consistent with a small abscess. 4. Gastric air-fluid level with wall thickening of the antrum and proximal duodenum which may reflect an element of gastric outlet obstruction secondary to adjacent  inflammatory changes. 5.  Aortic Atherosclerosis (ICD10-I70.0). 6. Large amount of stool throughout the colon. 7. Postsurgical changes the site partial ascending colectomy. Electronically Signed   By: Kathreen Devoid   On: 05/18/2019 11:47   Ct Abdomen Pelvis W Contrast  Result Date: 05/15/2019 CLINICAL DATA:  Diarrhea, no pain EXAM: CT ABDOMEN AND PELVIS WITH CONTRAST TECHNIQUE: Multidetector CT imaging of the abdomen and pelvis was performed using the standard protocol following bolus administration of intravenous contrast. CONTRAST:  153mL OMNIPAQUE IOHEXOL 300 MG/ML  SOLN COMPARISON:  04/19/2019 FINDINGS: Lower chest: Small right pleural effusion. Trace left pleural effusion. Right basilar atelectasis. Right gynecomastia again noted. Hepatobiliary: No focal hepatic mass. No intrahepatic fluid collection. No interval change in minimal intrahepatic biliary duct dilatation centrally. Interval cholecystectomy. 3.5 x 4.6 cm complex fluid collection in the gallbladder fossa with an air-fluid level concerning for an abscess. Pancreas: Unremarkable. No pancreatic ductal dilatation or surrounding inflammatory changes. Spleen: Normal in size without focal abnormality. Adrenals/Urinary Tract: Normal adrenal glands. Stable 2 cm left renal cyst. Kidneys otherwise normal. No urolithiasis or obstructive uropathy. Normal bladder. Stomach/Bowel: No bowel dilatation to suggest bowel obstruction. Gastric air-fluid level with wall thickening of the antrum and proximal duodenum which may reflect an element of gastric outlet obstruction. Bowel wall thickening and surrounding inflammatory changes around the proximal ascending colon concerning for  mild colitis. Normal appendix. 2.8 cm fluid collection along the anti mesenteric aspect of the proximal ascending colon along the anterior abdominal wall most consistent with an abscess. Vascular/Lymphatic: Normal caliber abdominal aorta with mild atherosclerosis. No lymphadenopathy. Reproductive: Prostate is unremarkable. Other: No abdominal wall hernia or abnormality. No abdominopelvic ascites. Postsurgical changes in the subcutaneous fat of the right anterior abdominal wall. Musculoskeletal: No acute osseous abnormality. No aggressive osseous lesion. Degenerative disease with disc height loss at L2-3, L3-4 L4-5, L5-S1. IMPRESSION: 1. Interval cholecystectomy. 3.5 x 4.6 cm complex fluid collection in the gallbladder fossa with an air-fluid level concerning for an abscess. No CT evidence of cholangitis. 2. Gastric air-fluid level with reactive wall thickening of the antrum and proximal duodenum which may reflect an element of gastric outlet obstruction. Bowel wall thickening and surrounding inflammatory changes around the proximal ascending colon concerning for mild colitis. 2.8 cm fluid collection along the anti mesenteric aspect of the proximal ascending colon along the anterior abdominal wall most consistent with an abscess. 3. Small bilateral pleural effusions. Electronically Signed   By: Kathreen Devoid   On: 05/15/2019 12:16   Ct Abdomen Pelvis W Contrast  Result Date: 04/19/2019 CLINICAL DATA:  Rectal bleeding and anemia. EXAM: CT ABDOMEN AND PELVIS WITH CONTRAST TECHNIQUE: Multidetector CT imaging of the abdomen and pelvis was performed using the standard protocol following bolus administration of intravenous contrast. CONTRAST:  181mL OMNIPAQUE IOHEXOL 300 MG/ML  SOLN COMPARISON:  CT dated 10/22/2016. FINDINGS: Lower chest: There is bilateral gynecomastia. The heart is enlarged. Coronary artery calcifications are noted. Hepatobiliary: The liver is unremarkable. The gallbladder is diffusely abnormal with  multiple pockets of gas and hyperdense material. The gallbladder wall does not appear significantly thickened. The common bile duct is mildly dilated. There appears to be a fistula tract from the gallbladder to the hepatic flexure of the colon. This can be best visualized on the coronal and sagittal series. An old cholecystostomy tube drain track is noted coursing towards the anterior abdominal wall. Pancreas: Unremarkable. No pancreatic ductal dilatation or surrounding inflammatory changes. Spleen: Normal in size without focal abnormality. Adrenals/Urinary Tract: The left kidney  is unremarkable aside from a small cyst arising from the lower pole. There is a slightly complex 2.3 cm cystic structure in the upper pole the right kidney. Both adrenal glands are unremarkable. There is no hydronephrosis. The urinary bladder is significantly distended with layering hyperdense material within the dependent portion, likely related to prior contrast injection. Stomach/Bowel: There are scattered colonic diverticula without CT evidence of diverticulitis. There are air-fluid levels in the colon which are likely related to prior colonoscopy prep. The appendix is unremarkable. The stomach is grossly unremarkable. There is no evidence of a small-bowel obstruction. Vascular/Lymphatic: Aortic atherosclerosis. No enlarged abdominal or pelvic lymph nodes. Reproductive: The prostate gland is significantly enlarged. Other: No abdominal wall hernia or abnormality. No abdominopelvic ascites. Musculoskeletal: Multilevel degenerative changes are noted of the thoracolumbar spine. IMPRESSION: 1. Findings highly suspicious for a fistula track between the gallbladder lumen and hepatic flexure of the colon. There are multiple pockets of gas and debris within the gallbladder lumen. There is no definite CT evidence of acute cholecystitis. 2. Moderate dilatation of the common bile duct. Correlation with laboratory studies is recommended to help  exclude an obstructing process. 3. Significantly distended urinary bladder. 4. No specific abnormality identified to explain the patient's painless hematochezia. 5. Cardiomegaly.  Bilateral gynecomastia is noted. Electronically Signed   By: Constance Holster M.D.   On: 04/19/2019 21:41   US Renal  Result Date: 04/28/2019 CLINICAL DATA:  Acute renal insufficiency. Patient currently admitted. Diabetes. EXAM: RENAL / URINARY TRACT ULTRASOUND COMPLETE COMPARISON:  CT 04/19/2019 FINDINGS: Right Kidney: Renal measurements: 10.8 x 5.5 x 6.8 cm = volume: 210 mL . Echogenicity within normal limits. No mass or hydronephrosis visualized. 2.3 cm upper pole cyst. Left Kidney: Renal measurements: 9.0 x 6.0 x 5.1 cm = volume: 144 mL. Echogenicity within normal limits. No mass or hydronephrosis visualized. 2.6 cm midpole cyst and 2.5 cm upper pole cyst. Bladder: Appears normal for degree of bladder distention. IMPRESSION: Normal size kidneys without hydronephrosis.  Bilateral renal cysts. Electronically Signed   By: Marin Olp M.D.   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 Now with rim enhancing fluid collection in the gallbladder fossa on recent CT. EXAM: MRI ABDOMEN WITHOUT AND WITH CONTRAST (INCLUDING MRCP) TECHNIQUE: Multiplanar multisequence MR imaging of the abdomen was performed both before and after the administration of intravenous contrast. Heavily T2-weighted images of the biliary and pancreatic ducts were obtained, and three-dimensional MRCP images were rendered by post processing. CONTRAST:  10 cc Gadavist COMPARISON:  CT scan earlier today. FINDINGS: Exam is markedly degraded by motion secondary to patient inability to reproducibly breath hold. Lower chest: Small right and tiny left pleural effusion. Dependent atelectasis noted right lower lobe. Hepatobiliary:  No gross focal enhancing lesion within the liver. Mild intrahepatic biliary duct dilatation is associated with extrahepatic common duct measuring up to 6 mm diameter. Areas of signal void within the hepatic ductal anatomy compatible with gas as seen on CT earlier today. 3.5 by 3.6 cm fluid collection with gas identified in the gallbladder fossa. Pancreas: Nondiagnostic imaging due to motion artifact. No gross main pancreatic ductal dilatation. Spleen:  No gross abnormality within the spleen. Adrenals/Urinary Tract: No adrenal nodule or mass. 2.7 cm cystic lesion in the upper pole right kidney with some probable septation along the internal wall. This is contiguous with and possibly  insinuating around a upper pole collecting system, but enhancing mural nodule not completely excluded. 2.7 cm simple cyst noted lower pole left kidney. Renal assessment limited by the substantial motion artifact and tiny renal lesions identified on today's study cannot be definitively characterized. Stomach/Bowel: Moderate gastric distention. Fine detail of bowel loops in the abdomen obscured by motion artifact, but no gross small bowel or colonic dilatation within the visualized abdomen. Vascular/Lymphatic: No evidence for abdominal aortic aneurysm. No abdominal lymphadenopathy evident. Other:  No intraperitoneal free fluid. Musculoskeletal: No abnormal marrow enhancement within the visualized bony anatomy. IMPRESSION: 1. 3.5 x 3.6 cm rim enhancing fluid collection identified in the gallbladder fossa. Imaging features remain compatible with abscess. 2. Mild prominence of intrahepatic bile ducts without substantial extrahepatic biliary duct dilatation. Areas of signal void within the hepatic ducts compatible with pneumobilia. 3. 2.7 cm cystic lesion upper pole right kidney with some internal septation along the inferior margin. There may be an enhancing mural nodule or this cyst may insinuate around a renal calyx. Repeat MRI in 3-6 months  recommended to ensure stability a cystic renal cell neoplasm not excluded. 4. Small bilateral pleural effusions, right greater than left. Electronically Signed   By: Misty Stanley M.D.   On: 05/18/2019 15:33   Dg Chest Port 1 View  Result Date: 04/30/2019 CLINICAL DATA:  Rectal bleeding.  Open cholecystectomy April 23, 2019. EXAM: PORTABLE CHEST 1 VIEW COMPARISON:  April 27, 2019 FINDINGS: Low lung volumes limit evaluation. Opacity remains in the right base, likely a small effusion with atelectasis. Infiltrate not excluded. No change in the cardiomediastinal silhouette. IMPRESSION: Mild opacity in the right base may represent a small effusion with atelectasis versus infiltrate. Recommend follow-up to resolution. Electronically Signed   By: Dorise Bullion III M.D   On: 04/30/2019 14:22   Dg Chest Port 1 View  Result Date: 04/27/2019 CLINICAL DATA:  Fever. EXAM: PORTABLE CHEST 1 VIEW COMPARISON:  10/21/2016. FINDINGS: Again demonstrated is a very poor inspiration. Mild-to-moderate bibasilar atelectasis. Mild peribronchial thickening. Stable borderline enlarged cardiac silhouette. Unremarkable bones. IMPRESSION: 1. Poor inspiration with mild to moderate bibasilar atelectasis. 2. Mild bronchitic changes. Electronically Signed   By: Claudie Revering M.D.   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 Now with rim enhancing fluid collection in the gallbladder fossa on recent CT. EXAM: MRI ABDOMEN WITHOUT AND WITH CONTRAST (INCLUDING MRCP) TECHNIQUE: Multiplanar multisequence MR imaging of the abdomen was performed both before and after the administration of intravenous contrast. Heavily T2-weighted images of the biliary and pancreatic ducts were obtained, and three-dimensional MRCP images were rendered by post processing. CONTRAST:  10 cc Gadavist COMPARISON:  CT scan  earlier today. FINDINGS: Exam is markedly degraded by motion secondary to patient inability to reproducibly breath hold. Lower chest: Small right and tiny left pleural effusion. Dependent atelectasis noted right lower lobe. Hepatobiliary: No gross focal enhancing lesion within the liver. Mild intrahepatic biliary duct dilatation is associated with extrahepatic common duct measuring up to 6 mm diameter. Areas of signal void within the hepatic ductal anatomy compatible with gas as seen on CT earlier today. 3.5 by 3.6 cm fluid collection with gas identified in the gallbladder fossa. Pancreas: Nondiagnostic imaging due to motion artifact. No gross main pancreatic ductal dilatation. Spleen:  No gross abnormality within the spleen. Adrenals/Urinary Tract: No adrenal nodule or mass.  2.7 cm cystic lesion in the upper pole right kidney with some probable septation along the internal wall. This is contiguous with and possibly insinuating around a upper pole collecting system, but enhancing mural nodule not completely excluded. 2.7 cm simple cyst noted lower pole left kidney. Renal assessment limited by the substantial motion artifact and tiny renal lesions identified on today's study cannot be definitively characterized. Stomach/Bowel: Moderate gastric distention. Fine detail of bowel loops in the abdomen obscured by motion artifact, but no gross small bowel or colonic dilatation within the visualized abdomen. Vascular/Lymphatic: No evidence for abdominal aortic aneurysm. No abdominal lymphadenopathy evident. Other:  No intraperitoneal free fluid. Musculoskeletal: No abnormal marrow enhancement within the visualized bony anatomy. IMPRESSION: 1. 3.5 x 3.6 cm rim enhancing fluid collection identified in the gallbladder fossa. Imaging features remain compatible with abscess. 2. Mild prominence of intrahepatic bile ducts without substantial extrahepatic biliary duct dilatation. Areas of signal void within the hepatic ducts  compatible with pneumobilia. 3. 2.7 cm cystic lesion upper pole right kidney with some internal septation along the inferior margin. There may be an enhancing mural nodule or this cyst may insinuate around a renal calyx. Repeat MRI in 3-6 months recommended to ensure stability a cystic renal cell neoplasm not excluded. 4. Small bilateral pleural effusions, right greater than left. Electronically Signed   By: Misty Stanley M.D.   On: 05/18/2019 15:33   US Abdomen Limited Ruq  Result Date: 05/15/2019 CLINICAL DATA:  Recent cholecystectomy.  Elevated liver enzymes EXAM: ULTRASOUND ABDOMEN LIMITED RIGHT UPPER QUADRANT COMPARISON:  CT abdomen and pelvis Apr 19, 2019 FINDINGS: Gallbladder: Surgically absent. No lesions seen in the gallbladder fossa by ultrasound. Common bile duct: Diameter: 10 mm which is upper normal in size for post cholecystectomy state. No appreciable intrahepatic biliary duct dilatation evident. Note that portions of the common bile duct are obscured by gas. Liver: Echogenic foci which shadow are noted in the liver, likely with in intrahepatic ducts. Appearance suggests air in these areas. No liver mass lesions are evident. Within normal limits in parenchymal echogenicity. Portal vein is patent on color Doppler imaging with normal direction of blood flow towards the liver. There is a small right pleural effusion. IMPRESSION: 1. Suspect foci of air within intrahepatic biliary ducts. This finding raises concern for potential degree of cholangitis. The common bile duct is upper normal in size for post cholecystectomy state. Note that portions of the common bile duct are not seen. Given these concerns, CT or MR, ideally with intravenous contrast, advised to further assess. 2. Gallbladder absent. No lesions seen by ultrasound in the gallbladder fossa region. 3.  Small right pleural effusion. These results will be called to the ordering clinician or representative by the Radiologist Assistant, and  communication documented in the PACS or zVision Dashboard. Electronically Signed   By: Lowella Grip III M.D.   On: 05/15/2019 09:15     CBC Recent Labs  Lab 05/15/19 1011 05/16/19 0603 05/17/19 0525 05/18/19 0820 05/19/19 0820  WBC 17.6* 16.6* 15.0* 12.6* 12.8*  HGB 9.4* 8.7* 8.6* 8.6* 8.9*  HCT 30.3* 28.7* 28.5* 28.1* 28.8*  PLT 797* 648* 568* 495* 464*  MCV 92.4 92.9 93.1 91.8 91.1  MCH 28.7 28.2 28.1 28.1 28.2  MCHC 31.0 30.3 30.2 30.6 30.9  RDW 14.6 14.6 14.7 14.9 14.9  LYMPHSABS 4.1*  --   --   --   --   MONOABS 0.9  --   --   --   --  EOSABS 0.4  --   --   --   --   BASOSABS 0.1  --   --   --   --     Chemistries  Recent Labs  Lab 05/15/19 1011 05/16/19 0603 05/17/19 0525 05/18/19 0820 05/19/19 0820  NA 138 140 139 139 139  K 4.6 4.4 4.3 4.2 4.1  CL 100 104 101 104 101  CO2 23 27 25 25 26   GLUCOSE 220* 168* 179* 201* 181*  BUN 29* 21 21 15 11   CREATININE 1.38* 1.40* 1.44* 1.25* 1.23  CALCIUM 8.4* 8.2* 8.2* 8.2* 8.7*  AST 92* 68* 57* 48* 36  ALT 149* 111* 102* 92* 75*  ALKPHOS 108 94 94 104 100  BILITOT 0.5 0.4 0.4 0.2* 0.5   ------------------------------------------------------------------------------------------------------------------ No results for input(s): CHOL, HDL, LDLCALC, TRIG, CHOLHDL, LDLDIRECT in the last 72 hours.  Lab Results  Component Value Date   HGBA1C 8.4 (H) 10/14/2016   ------------------------------------------------------------------------------------------------------------------ No results for input(s): TSH, T4TOTAL, T3FREE, THYROIDAB in the last 72 hours.  Invalid input(s): FREET3 ------------------------------------------------------------------------------------------------------------------ No results for input(s): VITAMINB12, FOLATE, FERRITIN, TIBC, IRON, RETICCTPCT in the last 72 hours.  Coagulation profile Recent Labs  Lab 05/15/19 1011  INR 1.1    No results for input(s): DDIMER in the last 72  hours.  Cardiac Enzymes No results for input(s): CKMB, TROPONINI, MYOGLOBIN in the last 168 hours.  Invalid input(s): CK ------------------------------------------------------------------------------------------------------------------    Component Value Date/Time   BNP 62.7 10/12/2016 1707     Berenice Oehlert M.D on 05/19/2019 at 10:41 AM  Go to [email or web address] - for contact info  [facility] - Office  [phone]

## 2019-05-19 NOTE — Progress Notes (Signed)
Given the MRCP and repeat CT findings, one has to rule out an occult bile leak.  Agree with transfer to gastroenterology at [facility] for possible ERCP.  HIDA scan not available at [facility] this weekend.  This was explained to the patient who understands.

## 2019-05-19 NOTE — Consult Note (Signed)
[facility] CONSULT Reason for consult: Possible need for ERCP Referring Physician: [facility] hospitalist  Stephen Bales Sr. is an 78 y.o. male.  HPI: He has a somewhat complex history.  He was admitted to [facility] with a complaint of rectal bleeding on 527.  He had prior history of diverticulosis and colonic adenomas.  He subsequently underwent colonoscopy by Dr. [name] with some adenomas removed, had some lipomas and there was what appeared to be a defect at the hepatic flexure which proved to be a fistulous connection to the gallbladder.Subsequently seen by Dr. [name].  CT showed a choledocho colonic fistula.  He had gallstones and had several episodes of cholecystitis.  Underwent partial colectomy of the hepatic flexure and cholecystectomy 6/1 he was discharged to a rehab facility for SNF.  Due to continued nausea he had an ultrasound performed which showed air in the biliary system was sent to the emergency department.  He had a CT scan which showed possible abscess in the gallbladder fossa.He had leukocytosis and was started on antibiotics.  No HIDA scan could be obtained and [facility] to determine if he had a bile leak.  It was also felt that he may need ERCP since the fluid collection tended to be getting bigger.  It was not clear if this was due to a leak or an abscess.  Radiology did not feel it was likely an abscess amenable to drainage.  For this reason he was transferred to [facility].  The patient denies any significant abdominal pain states he has been doing fairly well with a full liquid diet. His other medical problems include diabetes, hypertension, CKD, history of DVT.  Past Medical History:  Diagnosis Date  . CKD (chronic kidney disease), stage III ([place])   . Diabetes mellitus   . DVT (deep venous thrombosis) ([place]) 10/14/2016  . Extremity edema 09/2016  . Hyperlipemia   . Hypertension     Past Surgical History:  Procedure Laterality Date  .  CHOLECYSTECTOMY N/A 04/23/2019   Procedure: CHOLECYSTECTOMY;  Surgeon: Aviva Signs, MD;  Location: [facility] ORS;  Service: General;  Laterality: N/A;  . COLONOSCOPY  02/2010   Dr. [name]: diverticuolosis, multiple polyps removed, cecal adenoma.   . COLONOSCOPY WITH PROPOFOL N/A 04/19/2019   Procedure: COLONOSCOPY WITH PROPOFOL;  Surgeon: [name], MD;  Location: [facility] ENDO SUITE;  Service: Endoscopy;  Laterality: N/A;  . IR GENERIC HISTORICAL  10/19/2016   IR PERC CHOLECYSTOSTOMY 10/19/2016 [name], MD [facility]  . IR GENERIC HISTORICAL  10/25/2016   IR CATHETER TUBE CHANGE 10/25/2016 [name], MD [facility]  . IR GENERIC HISTORICAL  11/30/2016   IR RADIOLOGIST EVAL & MGMT 11/30/2016 [facility] INTER[facility] RAD  . PARTIAL COLECTOMY N/A 04/23/2019   Procedure: PARTIAL COLECTOMY;  Surgeon: Aviva Signs, MD;  Location: [facility] ORS;  Service: General;  Laterality: N/A;  . POLYPECTOMY  04/19/2019   Procedure: POLYPECTOMY;  Surgeon: [name], MD;  Location: [facility] END[facility] SUITE;  Service: Endoscopy;;  polyp at recto sigmoid    Family History  Problem Relation Age of Onset  . Heart attack Other   . Tuberculosis Other   . Tuberculosis Other   . Diabetes type II Father   . Hypertension Father   . Heart attack Maternal Aunt     Social History:  reports that he has never smoked. He has never used smokeless tobacco. He reports that he does not drink alcohol or use drugs.  Allergies: No Known  Allergies  Medications; Prior to Admission medications   Medication Sig Start Date End Date Taking? Authorizing Provider  acetaminophen (TYLENOL) 325 MG tablet Take 2 tablets (650 mg total) by mouth every 6 (six) hours as needed for mild pain, fever or headache (or Fever >/= 101). 05/03/19  Yes Emokpae, Courage, MD  Amino Acids-Protein Hydrolys (FEEDING SUPPLEMENT, PRO-STAT SUGAR FREE 64,) LIQD Take 30 mLs by mouth 2 (two) times daily between meals. 05/08/19  Yes [provider]  aspirin EC 81 MG tablet  Take 81 mg by mouth daily.   Yes [provider]  atorvastatin (LIPITOR) 40 MG tablet Take 40 mg by mouth daily.   Yes [provider]  furosemide (LASIX) 20 MG tablet Take 1 tablet (20 mg total) by mouth daily with breakfast. 05/03/19  Yes Emokpae, Courage, MD  hydrALAZINE (APRESOLINE) 100 MG tablet Take 0.5 tablets (50 mg total) by mouth 3 (three) times daily. 05/03/19  Yes Emokpae, Courage, MD  lisinopril (ZESTRIL) 40 MG tablet Take 0.5 tablets (20 mg total) by mouth daily. 05/03/19  Yes Emokpae, Courage, MD  metFORMIN (GLUCOPHAGE) 500 MG tablet Take 500 mg by mouth 2 (two) times a day. 02/28/19  Yes [provider]  NON FORMULARY Diet Type:  NAS, Consistent Carbohydrate, bland diet 05/04/19  Yes [provider]  Nutritional Supplements (ENSURE CLEAR) LIQD Take 1 Bottle by mouth 2 (two) times daily between meals. 05/08/19  Yes [provider]  Nutritional Supplements (ENSURE ENLIVE PO) Take 1 Bottle by mouth daily. 05/04/19  Yes [provider]  ondansetron (ZOFRAN-ODT) 4 MG disintegrating tablet Take 1 tablet (4 mg total) by mouth every 6 (six) hours as needed for nausea. 05/03/19  Yes Emokpae, Courage, MD  pantoprazole (PROTONIX) 40 MG tablet Take 1 tablet (40 mg total) by mouth daily. 05/04/19  Yes Emokpae, Courage, MD  spironolactone (ALDACTONE) 25 MG tablet Take 25 mg by mouth daily.   Yes [provider]  verapamil (CALAN-SR) 120 MG CR tablet Take 1 tablet (120 mg total) by mouth daily. 05/03/19  Yes Emokpae, Courage, MD   . aspirin EC  81 mg Oral Daily  . atorvastatin  40 mg Oral q1800  . bisacodyl  10 mg Rectal Once  . feeding supplement  1 Container Oral TID BM  . feeding supplement (ENSURE ENLIVE)  237 mL Oral BID BM  . heparin injection (subcutaneous)  5,000 Units Subcutaneous Q8H  . hydrALAZINE  100 mg Oral TID  . insulin aspart  0-9 Units Subcutaneous Q4H  . [START ON 05/20/2019] isosorbide mononitrate  60 mg Oral Daily  .  lactulose  30 g Oral Once  . pantoprazole  40 mg Oral Daily  . polyethylene glycol  17 g Oral BID  . [START ON 05/20/2019] verapamil  240 mg Oral Daily   PRN Meds acetaminophen **OR** acetaminophen, labetalol, ondansetron **OR** ondansetron (ZOFRAN) IV Results for orders placed or performed during the hospital encounter of 05/15/19 (from the past 48 hour(s))  Glucose, capillary     Status: Abnormal   Collection Time: 05/17/19  4:37 PM  Result Value Ref Range   Glucose-Capillary 258 (H) 70 - 99 mg/dL  Glucose, capillary     Status: Abnormal   Collection Time: 05/17/19  8:09 PM  Result Value Ref Range   Glucose-Capillary 240 (H) 70 - 99 mg/dL  Glucose, capillary     Status: Abnormal   Collection Time: 05/18/19 12:44 AM  Result Value Ref Range   Glucose-Capillary 169 (H) 70 -  99 mg/dL  Glucose, capillary     Status: Abnormal   Collection Time: 05/18/19  4:17 AM  Result Value Ref Range   Glucose-Capillary 199 (H) 70 - 99 mg/dL  Glucose, capillary     Status: Abnormal   Collection Time: 05/18/19  7:56 AM  Result Value Ref Range   Glucose-Capillary 167 (H) 70 - 99 mg/dL  Comprehensive metabolic panel     Status: Abnormal   Collection Time: 05/18/19  8:20 AM  Result Value Ref Range   Sodium 139 135 - 145 mmol/L   Potassium 4.2 3.5 - 5.1 mmol/L   Chloride 104 98 - 111 mmol/L   CO2 25 22 - 32 mmol/L   Glucose, Bld 201 (H) 70 - 99 mg/dL   BUN 15 8 - 23 mg/dL   Creatinine, Ser 1.25 (H) 0.61 - 1.24 mg/dL   Calcium 8.2 (L) 8.9 - 10.3 mg/dL   Total Protein 7.1 6.5 - 8.1 g/dL   Albumin 2.7 (L) 3.5 - 5.0 g/dL   AST 48 (H) 15 - 41 U/L   ALT 92 (H) 0 - 44 U/L   Alkaline Phosphatase 104 38 - 126 U/L   Total Bilirubin 0.2 (L) 0.3 - 1.2 mg/dL   GFR calc non Af Amer 54 (L) >60 mL/min   GFR calc Af Amer >60 >60 mL/min   Anion gap 10 5 - 15    Comment: Performed at [facility], [address] [place], [place] [address]  CBC     Status: Abnormal   Collection Time: 05/18/19  8:20 AM  Result  Value Ref Range   WBC 12.6 (H) 4.0 - 10.5 K/uL   RBC 3.06 (L) 4.22 - 5.81 MIL/uL   Hemoglobin 8.6 (L) 13.0 - 17.0 g/dL   HCT 28.1 (L) 39.0 - 52.0 %   MCV 91.8 80.0 - 100.0 fL   MCH 28.1 26.0 - 34.0 pg   MCHC 30.6 30.0 - 36.0 g/dL   RDW 14.9 11.5 - 15.5 %   Platelets 495 (H) 150 - 400 K/uL   nRBC 0.2 0.0 - 0.2 %    Comment: Performed at [facility], [address], [place], [place] [address]  Glucose, capillary     Status: Abnormal   Collection Time: 05/18/19 12:13 PM  Result Value Ref Range   Glucose-Capillary 244 (H) 70 - 99 mg/dL  Glucose, capillary     Status: Abnormal   Collection Time: 05/18/19  5:34 PM  Result Value Ref Range   Glucose-Capillary 231 (H) 70 - 99 mg/dL  Glucose, capillary     Status: Abnormal   Collection Time: 05/18/19  9:33 PM  Result Value Ref Range   Glucose-Capillary 170 (H) 70 - 99 mg/dL  Glucose, capillary     Status: Abnormal   Collection Time: 05/19/19 12:03 AM  Result Value Ref Range   Glucose-Capillary 162 (H) 70 - 99 mg/dL  Glucose, capillary     Status: Abnormal   Collection Time: 05/19/19  4:22 AM  Result Value Ref Range   Glucose-Capillary 169 (H) 70 - 99 mg/dL  Glucose, capillary     Status: Abnormal   Collection Time: 05/19/19  7:42 AM  Result Value Ref Range   Glucose-Capillary 162 (H) 70 - 99 mg/dL  Comprehensive metabolic panel     Status: Abnormal   Collection Time: 05/19/19  8:20 AM  Result Value Ref Range   Sodium 139 135 - 145 mmol/L   Potassium 4.1 3.5 - 5.1 mmol/L  Chloride 101 98 - 111 mmol/L   CO2 26 22 - 32 mmol/L   Glucose, Bld 181 (H) 70 - 99 mg/dL   BUN 11 8 - 23 mg/dL   Creatinine, Ser 1.23 0.61 - 1.24 mg/dL   Calcium 8.7 (L) 8.9 - 10.3 mg/dL   Total Protein 6.8 6.5 - 8.1 g/dL   Albumin 2.7 (L) 3.5 - 5.0 g/dL   AST 36 15 - 41 U/L   ALT 75 (H) 0 - 44 U/L   Alkaline Phosphatase 100 38 - 126 U/L   Total Bilirubin 0.5 0.3 - 1.2 mg/dL   GFR calc non Af Amer 55 (L) >60 mL/min   GFR calc Af Amer >60 >60 mL/min    Anion gap 12 5 - 15    Comment: Performed at [facility], [address], [place], [place] [address]  CBC     Status: Abnormal   Collection Time: 05/19/19  8:20 AM  Result Value Ref Range   WBC 12.8 (H) 4.0 - 10.5 K/uL   RBC 3.16 (L) 4.22 - 5.81 MIL/uL   Hemoglobin 8.9 (L) 13.0 - 17.0 g/dL   HCT 28.8 (L) 39.0 - 52.0 %   MCV 91.1 80.0 - 100.0 fL   MCH 28.2 26.0 - 34.0 pg   MCHC 30.9 30.0 - 36.0 g/dL   RDW 14.9 11.5 - 15.5 %   Platelets 464 (H) 150 - 400 K/uL   nRBC 0.0 0.0 - 0.2 %    Comment: Performed at [facility], [address], [place], [place] [address]  Glucose, capillary     Status: Abnormal   Collection Time: 05/19/19 12:25 PM  Result Value Ref Range   Glucose-Capillary 156 (H) 70 - 99 mg/dL    Ct Abdomen Pelvis W Contrast  Result Date: 05/18/2019 CLINICAL DATA:  Abdominal pain, status post cholecystectomy and partial colectomy. EXAM: CT ABDOMEN AND PELVIS WITH CONTRAST TECHNIQUE: Multidetector CT imaging of the abdomen and pelvis was performed using the standard protocol following bolus administration of intravenous contrast. CONTRAST:  35mL OMNIPAQUE IOHEXOL 300 MG/ML SOLN, 137mL OMNIPAQUE IOHEXOL 300 MG/ML SOLN COMPARISON:  05/15/2019, 04/19/2019 FINDINGS: Lower chest: Small right pleural effusion with basilar atelectasis. Trace left pleural effusion. Hepatobiliary: No focal mass. Interval development of air within the intrahepatic biliary collecting system 3.5 x 4.2 cm with mild dilatation of the central biliary collecting system may reflect developing cholangitis in the absence of recent instrumentation. Complex fluid collection in the gallbladder fossa with the overall size unchanged, but increase in the amount of fluid within the collection most consistent with an abscess. Mild surrounding inflammatory changes. Pancreas: Unremarkable. No pancreatic ductal dilatation or surrounding inflammatory changes. Spleen: Normal in size without focal abnormality. Adrenals/Urinary Tract:  Normal adrenal glands. Stable 2 cm left renal cyst. Kidneys otherwise normal. No urolithiasis or obstructive uropathy. Normal bladder. Stomach/Bowel: No bowel dilatation to suggest bowel obstruction. Gastric air-fluid level with wall thickening of the antrum and proximal duodenum which may reflect an element of gastric outlet obstruction secondary to adjacent inflammatory changes. Partial ascending colectomy a with adjacent post surgical changes. Large amount of stool throughout the colon. Normal appendix. Stable 22 x 15 mm complex fluid collection along the antimesenteric aspect of the proximal ascending colon along the anterior abdominal wall most consistent with a small abscess. Vascular/Lymphatic: Normal caliber abdominal aorta with mild atherosclerosis. No lymphadenopathy. Reproductive: Prostate is unremarkable. Other: No abdominal wall hernia or abnormality. No abdominopelvic ascites. Postsurgical changes in the right anterior abdominal wall.  18 x 15 mm complex fluid in the anterior abdominal wall subcutaneous fat along the surgical site likely reflecting a small hematoma. Musculoskeletal: No acute osseous abnormality. No aggressive osseous lesion. Degenerative disease with disc height loss and facet arthropathy at L2-3, L3-4, L4-5 and L5-S1. Bilateral foraminal stenosis at L4-5 and L5-S1. IMPRESSION: 1. Interval development of air within the intrahepatic biliary collecting system 3.5 x 4.2 cm with mild dilatation of the central biliary collecting system may reflect developing cholangitis in the absence of recent instrumentation. 2. 3.5 x 4.2 cm complex fluid collection in the gallbladder fossa with the overall size unchanged, but there is increase in the amount of fluid within the collection most consistent with an abscess. Mild surrounding inflammatory changes. 3. Stable 22 x 15 mm complex fluid collection along the antimesenteric aspect of the proximal ascending colon along the anterior abdominal wall most  consistent with a small abscess. 4. Gastric air-fluid level with wall thickening of the antrum and proximal duodenum which may reflect an element of gastric outlet obstruction secondary to adjacent inflammatory changes. 5.  Aortic Atherosclerosis (ICD10-I70.0). 6. Large amount of stool throughout the colon. 7. Postsurgical changes the site partial ascending colectomy. Electronically Signed   By: Kathreen Devoid   On: 05/18/2019 11:47   Mr 3d Recon At Scanner  Result Date: 05/18/2019 CLINICAL DATA:  status post open cholecystectomy with partial colectomy for a cholecysto-colo fistula on April 23, 2019. Now with rim enhancing fluid collection in the gallbladder fossa on recent CT. EXAM: MRI ABDOMEN WITHOUT AND WITH CONTRAST (INCLUDING MRCP) TECHNIQUE: Multiplanar multisequence MR imaging of the abdomen was performed both before and after the administration of intravenous contrast. Heavily T2-weighted images of the biliary and pancreatic ducts were obtained, and three-dimensional MRCP images were rendered by post processing. CONTRAST:  10 cc Gadavist COMPARISON:  CT scan earlier today. FINDINGS: Exam is markedly degraded by motion secondary to patient inability to reproducibly breath hold. Lower chest: Small right and tiny left pleural effusion. Dependent atelectasis noted right lower lobe. Hepatobiliary: No gross focal enhancing lesion within the liver. Mild intrahepatic biliary duct dilatation is associated with extrahepatic common duct measuring up to 6 mm diameter. Areas of signal void within the hepatic ductal anatomy compatible with gas as seen on CT earlier today. 3.5 by 3.6 cm fluid collection with gas identified in the gallbladder fossa. Pancreas: Nondiagnostic imaging due to motion artifact. No gross main pancreatic ductal dilatation. Spleen:  No gross abnormality within the spleen. Adrenals/Urinary Tract: No adrenal nodule or mass. 2.7 cm cystic lesion in the upper pole right kidney with some probable  septation along the internal wall. This is contiguous with and possibly insinuating around a upper pole collecting system, but enhancing mural nodule not completely excluded. 2.7 cm simple cyst noted lower pole left kidney. Renal assessment limited by the substantial motion artifact and tiny renal lesions identified on today's study cannot be definitively characterized. Stomach/Bowel: Moderate gastric distention. Fine detail of bowel loops in the abdomen obscured by motion artifact, but no gross small bowel or colonic dilatation within the visualized abdomen. Vascular/Lymphatic: No evidence for abdominal aortic aneurysm. No abdominal lymphadenopathy evident. Other:  No intraperitoneal free fluid. Musculoskeletal: No abnormal marrow enhancement within the visualized bony anatomy. IMPRESSION: 1. 3.5 x 3.6 cm rim enhancing fluid collection identified in the gallbladder fossa. Imaging features remain compatible with abscess. 2. Mild prominence of intrahepatic bile ducts without substantial extrahepatic biliary duct dilatation. Areas of signal void within the hepatic ducts compatible with  pneumobilia. 3. 2.7 cm cystic lesion upper pole right kidney with some internal septation along the inferior margin. There may be an enhancing mural nodule or this cyst may insinuate around a renal calyx. Repeat MRI in 3-6 months recommended to ensure stability a cystic renal cell neoplasm not excluded. 4. Small bilateral pleural effusions, right greater than left. Electronically Signed   By: Misty Stanley M.D.   On: 05/18/2019 15:33   Mr Abdomen Mrcp Moise Boring Contast  Result Date: 05/18/2019 CLINICAL DATA:  status post open cholecystectomy with partial colectomy for a cholecysto-colo fistula on April 23, 2019. Now with rim enhancing fluid collection in the gallbladder fossa on recent CT. EXAM: MRI ABDOMEN WITHOUT AND WITH CONTRAST (INCLUDING MRCP) TECHNIQUE: Multiplanar multisequence MR imaging of the abdomen was performed both before  and after the administration of intravenous contrast. Heavily T2-weighted images of the biliary and pancreatic ducts were obtained, and three-dimensional MRCP images were rendered by post processing. CONTRAST:  10 cc Gadavist COMPARISON:  CT scan earlier today. FINDINGS: Exam is markedly degraded by motion secondary to patient inability to reproducibly breath hold. Lower chest: Small right and tiny left pleural effusion. Dependent atelectasis noted right lower lobe. Hepatobiliary: No gross focal enhancing lesion within the liver. Mild intrahepatic biliary duct dilatation is associated with extrahepatic common duct measuring up to 6 mm diameter. Areas of signal void within the hepatic ductal anatomy compatible with gas as seen on CT earlier today. 3.5 by 3.6 cm fluid collection with gas identified in the gallbladder fossa. Pancreas: Nondiagnostic imaging due to motion artifact. No gross main pancreatic ductal dilatation. Spleen:  No gross abnormality within the spleen. Adrenals/Urinary Tract: No adrenal nodule or mass. 2.7 cm cystic lesion in the upper pole right kidney with some probable septation along the internal wall. This is contiguous with and possibly insinuating around a upper pole collecting system, but enhancing mural nodule not completely excluded. 2.7 cm simple cyst noted lower pole left kidney. Renal assessment limited by the substantial motion artifact and tiny renal lesions identified on today's study cannot be definitively characterized. Stomach/Bowel: Moderate gastric distention. Fine detail of bowel loops in the abdomen obscured by motion artifact, but no gross small bowel or colonic dilatation within the visualized abdomen. Vascular/Lymphatic: No evidence for abdominal aortic aneurysm. No abdominal lymphadenopathy evident. Other:  No intraperitoneal free fluid. Musculoskeletal: No abnormal marrow enhancement within the visualized bony anatomy. IMPRESSION: 1. 3.5 x 3.6 cm rim enhancing fluid  collection identified in the gallbladder fossa. Imaging features remain compatible with abscess. 2. Mild prominence of intrahepatic bile ducts without substantial extrahepatic biliary duct dilatation. Areas of signal void within the hepatic ducts compatible with pneumobilia. 3. 2.7 cm cystic lesion upper pole right kidney with some internal septation along the inferior margin. There may be an enhancing mural nodule or this cyst may insinuate around a renal calyx. Repeat MRI in 3-6 months recommended to ensure stability a cystic renal cell neoplasm not excluded. 4. Small bilateral pleural effusions, right greater than left. Electronically Signed   By: Misty Stanley M.D.   On: 05/18/2019 15:33   None            Blood pressure (!) 181/94, pulse (!) 118, temperature 98.9 F (37.2 C), temperature source Oral, resp. rate 14, height 5\' 6"  (1.676 m), weight 90.4 kg, SpO2 96 %.  Physical exam:   General--Pleasant African-American male in no distress. ENT--nonicteric's Neck--supple with no adenopathy Heart--regular rate and rhythm without murmurs or gallops Lungs--clear without  wheezing. Abdomen--well-healing right upper quadrant surgical incision, no masses palpated good bowel sounds. Psych--pleasant and alert.  Answers questions appropriately   Assessment: 1.  Recent cholecystectomy with resection of the colon due to cholecystic colonic fistula. 2 fluid collection in the gallbladder fossa.  Possibly abscess versus bile leak.  Has progressed consistently since surgery.  3.  GI bleed.  Colonoscopy prior to surgery did show some polyps in the fistula but no other gross lesions  Plan: 1.  We will go ahead and continue on antibiotics since he does seem to be responding in full liquid diet for now 2.  Agree with going ahead with HIDA scan this to see if there is ongoing bile leak 3.  Potential ERCP with stent placement planned for Monday.  Nancy Fetter 05/19/2019, 2:35 PM   This note was  created using voice recognition software and minor errors may Have occurred unintentionally. Pager: [phone] If no answer or after hours call [phone]

## 2019-05-20 DIAGNOSIS — I129 Hypertensive chronic kidney disease with stage 1 through stage 4 chronic kidney disease, or unspecified chronic kidney disease: Secondary | ICD-10-CM

## 2019-05-20 DIAGNOSIS — R188 Other ascites: Secondary | ICD-10-CM

## 2019-05-20 DIAGNOSIS — D649 Anemia, unspecified: Secondary | ICD-10-CM

## 2019-05-20 DIAGNOSIS — K5900 Constipation, unspecified: Secondary | ICD-10-CM

## 2019-05-20 DIAGNOSIS — E1122 Type 2 diabetes mellitus with diabetic chronic kidney disease: Secondary | ICD-10-CM

## 2019-05-20 DIAGNOSIS — N183 Chronic kidney disease, stage 3 (moderate): Secondary | ICD-10-CM

## 2019-05-20 DIAGNOSIS — Z8719 Personal history of other diseases of the digestive system: Secondary | ICD-10-CM

## 2019-05-20 LAB — COMPREHENSIVE METABOLIC PANEL
ALT: 57 U/L — ABNORMAL HIGH (ref 0–44)
AST: 29 U/L (ref 15–41)
Albumin: 2.3 g/dL — ABNORMAL LOW (ref 3.5–5.0)
Alkaline Phosphatase: 85 U/L (ref 38–126)
Anion gap: 12 (ref 5–15)
BUN: 9 mg/dL (ref 8–23)
CO2: 25 mmol/L (ref 22–32)
Calcium: 8.5 mg/dL — ABNORMAL LOW (ref 8.9–10.3)
Chloride: 102 mmol/L (ref 98–111)
Creatinine, Ser: 1.33 mg/dL — ABNORMAL HIGH (ref 0.61–1.24)
GFR calc Af Amer: 59 mL/min — ABNORMAL LOW (ref >60)
GFR calc non Af Amer: 50 mL/min — ABNORMAL LOW (ref >60)
Glucose, Bld: 165 mg/dL — ABNORMAL HIGH (ref 70–99)
Potassium: 4 mmol/L (ref 3.5–5.1)
Sodium: 139 mmol/L (ref 135–145)
Total Bilirubin: 0.4 mg/dL (ref 0.3–1.2)
Total Protein: 6.4 g/dL — ABNORMAL LOW (ref 6.5–8.1)

## 2019-05-20 LAB — MAGNESIUM: Magnesium: 1.7 mg/dL (ref 1.7–2.4)

## 2019-05-20 LAB — HEMOGLOBIN A1C
Hgb A1c MFr Bld: 7 % — ABNORMAL HIGH (ref 4.8–5.6)
Mean Plasma Glucose: 154.2 mg/dL

## 2019-05-20 LAB — IRON AND TIBC
Iron: 33 ug/dL — ABNORMAL LOW (ref 45–182)
Saturation Ratios: 12 % — ABNORMAL LOW (ref 17.9–39.5)
TIBC: 267 ug/dL (ref 250–450)
UIBC: 234 ug/dL

## 2019-05-20 LAB — CBC
HCT: 26.5 % — ABNORMAL LOW (ref 39.0–52.0)
Hemoglobin: 8.3 g/dL — ABNORMAL LOW (ref 13.0–17.0)
MCH: 28.2 pg (ref 26.0–34.0)
MCHC: 31.3 g/dL (ref 30.0–36.0)
MCV: 90.1 fL (ref 80.0–100.0)
Platelets: 430 10*3/uL — ABNORMAL HIGH (ref 150–400)
RBC: 2.94 MIL/uL — ABNORMAL LOW (ref 4.22–5.81)
RDW: 15 % (ref 11.5–15.5)
WBC: 10.5 10*3/uL (ref 4.0–10.5)
nRBC: 0 % (ref 0.0–0.2)

## 2019-05-20 LAB — CULTURE, BLOOD (ROUTINE X 2): Culture: NO GROWTH

## 2019-05-20 LAB — GLUCOSE, CAPILLARY
Glucose-Capillary: 141 mg/dL — ABNORMAL HIGH (ref 70–99)
Glucose-Capillary: 167 mg/dL — ABNORMAL HIGH (ref 70–99)
Glucose-Capillary: 191 mg/dL — ABNORMAL HIGH (ref 70–99)
Glucose-Capillary: 202 mg/dL — ABNORMAL HIGH (ref 70–99)
Glucose-Capillary: 218 mg/dL — ABNORMAL HIGH (ref 70–99)
Glucose-Capillary: 231 mg/dL — ABNORMAL HIGH (ref 70–99)

## 2019-05-20 LAB — RETICULOCYTES
Immature Retic Fract: 28.2 % — ABNORMAL HIGH (ref 2.3–15.9)
RBC.: 2.96 MIL/uL — ABNORMAL LOW (ref 4.22–5.81)
Retic Count, Absolute: 79.6 10*3/uL (ref 19.0–186.0)
Retic Ct Pct: 2.7 % (ref 0.4–3.1)

## 2019-05-20 LAB — FERRITIN: Ferritin: 121 ng/mL (ref 24–336)

## 2019-05-20 LAB — FOLATE: Folate: 11.4 ng/mL (ref >5.9)

## 2019-05-20 LAB — VITAMIN B12: Vitamin B-12: 437 pg/mL (ref 180–914)

## 2019-05-20 MED ORDER — AMOXICILLIN-POT CLAVULANATE 875-125 MG PO TABS
1.0000 | ORAL_TABLET | Freq: Two times a day (BID) | ORAL | Status: DC
  Administered 2019-05-20 – 2019-05-21 (×2): 1 via ORAL
  Filled 2019-05-20 (×4): qty 1

## 2019-05-20 NOTE — Progress Notes (Signed)
PROGRESS NOTE  Stephen NOFZIGER Sr. TGY:[id number] DOB: 17-Oct-1941 DOA: 05/15/2019 PCP: [name], MD   LOS: 5 days   [facility]  Brief Narrative / Interim history: 78 year old male with history of DM-2, HTN, CKD 3, GI bleed, DVT in 2017, recent hospitalization 5/27-6/11 for rectal bleed requiring 5 units, and chole-colo-fistula requiring open cholecystectomy and partial colectomy on 12/31/7679 complicated by AKI.   Patient returned to [facility] with general weakness, ongoing nausea, urinary and bowel incontinence, elevated liver enzymes with leukocytosis.  On admission, hemodynamically stable except for intermittent tachycardia.  WBC 17.6.  Lactic acid 2.1.  AST 92.  ALT 149.  ALP 108.  Total bili 0.5.  CT abdomen and pelvis reveals 3.5 x 4.3 cm complex fluid collection in gallbladder fossa concerning for abscess.  No CT evidence of cholangitis.  Also some concern about gastric outlet obstruction, mild colitis, 2.8 cm fluid collection anterior mesenteric aspect of proximal ascending colon concerning for abscess. Patient was started on IV Zosyn.  General surgery consulted who recommended IR drainage of the fluid collections.  IR, Dr. Pascal Lux consulted and recommended antibiotic versus percutaneous drainage.  Patient's blood culture from 6/23 grew coag negative staph in 1 out of 2 bottles thought to be contaminant.  Patient had repeat CT abdomen and pelvis, and MRCP on 6/26 that revealed persistent previously seen fluid collections/abscesses, interval development of intrahepatic pneumobilia and biliary leak.  Previous provider discussed with [facility], Dr. Oletta Lamas who suggested transfer to [facility] for possible ERCP.   Patient arrived at [facility] in stable condition on 6/27.  He had a HIDA scan which was negative for biliary leak.  Evaluated by GI. Started on clear liquid that he tolerated.  Subjective: No major events overnight of this morning.  HIDA scan negative.   Tolerated clear liquid diet without problem.  Denies abdominal pain, nausea or vomiting.  No complaints.  Likes to try full liquid diet today.  Objective: Vitals:   05/19/19 1632 05/19/19 2120 05/19/19 2343 05/20/19 0335  BP: (!) 145/92 (!) 156/86 113/75 124/79  Pulse: 97 100 93 90  Resp: (!) 22 15 17 16   Temp: 98.2 F (36.8 C) (!) 97.5 F (36.4 C) 98.2 F (36.8 C) 98.2 F (36.8 C)  TempSrc: Oral Oral Oral Oral  SpO2: 97% 97% 98% 100%  Weight:      Height:        Intake/Output Summary (Last 24 hours) at 05/20/2019 0701 Last data filed at 05/20/2019 0312 Gross per 24 hour  Intake -  Output 725 ml  Net -725 ml   Filed Weights   05/15/19 0950 05/15/19 1919 05/15/19 2131  Weight: 92.1 kg 89.9 kg 90.4 kg    Examination:  GENERAL: No acute distress.  Appears well.  HEENT: MMM.  Vision and hearing grossly intact.  NECK: Supple.  No JVD.  LUNGS:  No IWOB. Good air movement bilaterally. HEART:  RRR. Heart sounds normal.  ABD: Bowel sounds present. Soft. Non tender.  MSK/EXT:  Moves all extremities. No apparent deformity. No edema bilaterally.  SKIN: no apparent skin lesion or wound NEURO: Awake, alert and oriented appropriately.  No gross deficit.  PSYCH: Calm. Normal affect.   I have personally reviewed the following labs and images: CBC: Recent Labs  Lab 05/15/19 1011 05/16/19 0603 05/17/19 0525 05/18/19 0820 05/19/19 0820  WBC 17.6* 16.6* 15.0* 12.6* 12.8*  NEUTROABS 11.9*  --   --   --   --  HGB 9.4* 8.7* 8.6* 8.6* 8.9*  HCT 30.3* 28.7* 28.5* 28.1* 28.8*  MCV 92.4 92.9 93.1 91.8 91.1  PLT 797* 648* 568* 495* 761*   Basic Metabolic Panel: Recent Labs  Lab 05/15/19 1011 05/16/19 0603 05/17/19 0525 05/18/19 0820 05/19/19 0820  NA 138 140 139 139 139  K 4.6 4.4 4.3 4.2 4.1  CL 100 104 101 104 101  CO2 23 27 25 25 26   GLUCOSE 220* 168* 179* 201* 181*  BUN 29* 21 21 15 11   CREATININE 1.38* 1.40* 1.44* 1.25* 1.23  CALCIUM 8.4* 8.2* 8.2* 8.2* 8.7*    GFR: Estimated Creatinine Clearance: 51.2 mL/min (by C-G formula based on SCr of 1.23 mg/dL). Liver Function Tests: Recent Labs  Lab 05/15/19 1011 05/16/19 0603 05/17/19 0525 05/18/19 0820 05/19/19 0820  AST 92* 68* 57* 48* 36  ALT 149* 111* 102* 92* 75*  ALKPHOS 108 94 94 104 100  BILITOT 0.5 0.4 0.4 0.2* 0.5  PROT 7.4 6.3* 6.5 7.1 6.8  ALBUMIN 2.7* 2.3* 2.5* 2.7* 2.7*   No results for input(s): LIPASE, AMYLASE in the last 168 hours. No results for input(s): AMMONIA in the last 168 hours. Coagulation Profile: Recent Labs  Lab 05/15/19 1011  INR 1.1   Cardiac Enzymes: No results for input(s): CKTOTAL, CKMB, CKMBINDEX, TROPONINI in the last 168 hours. BNP (last 3 results) No results for input(s): PROBNP in the last 8760 hours. HbA1C: No results for input(s): HGBA1C in the last 72 hours. CBG: Recent Labs  Lab 05/19/19 1225 05/19/19 1631 05/19/19 2118 05/19/19 2342 05/20/19 0334  GLUCAP 156* 136* 216* 213* 167*   Lipid Profile: No results for input(s): CHOL, HDL, LDLCALC, TRIG, CHOLHDL, LDLDIRECT in the last 72 hours. Thyroid Function Tests: No results for input(s): TSH, T4TOTAL, FREET4, T3FREE, THYROIDAB in the last 72 hours. Anemia Panel: No results for input(s): VITAMINB12, FOLATE, FERRITIN, TIBC, IRON, RETICCTPCT in the last 72 hours. Urine analysis:    Component Value Date/Time   COLORURINE YELLOW 04/29/2019 1330   APPEARANCEUR CLOUDY (A) 04/29/2019 1330   LABSPEC 1.019 04/29/2019 1330   PHURINE 5.0 04/29/2019 1330   GLUCOSEU NEGATIVE 04/29/2019 1330   HGBUR NEGATIVE 04/29/2019 1330   BILIRUBINUR NEGATIVE 04/29/2019 1330   KETONESUR NEGATIVE 04/29/2019 1330   PROTEINUR 30 (A) 04/29/2019 1330   UROBILINOGEN 0.2 01/04/2012 1646   NITRITE NEGATIVE 04/29/2019 1330   LEUKOCYTESUR NEGATIVE 04/29/2019 1330   Sepsis Labs: Invalid input(s): PROCALCITONIN, LACTICIDVEN  Recent Results (from the past 240 hour(s))  SARS Coronavirus 2 (CEPHEID - Performed in  [facility] hospital lab), Hosp Order     Status: None   Collection Time: 05/15/19 12:28 PM   Specimen: Nasopharyngeal Swab  Result Value Ref Range Status   SARS Coronavirus 2 NEGATIVE NEGATIVE Final    Comment: (NOTE) If result is NEGATIVE SARS-CoV-2 target nucleic acids are NOT DETECTED. The SARS-CoV-2 RNA is generally detectable in upper and lower  respiratory specimens during the acute phase of infection. The lowest  concentration of SARS-CoV-2 viral copies this assay can detect is 250  copies / mL. A negative result does not preclude SARS-CoV-2 infection  and should not be used as the sole basis for treatment or other  patient management decisions.  A negative result may occur with  improper specimen collection / handling, submission of specimen other  than nasopharyngeal swab, presence of viral mutation(s) within the  areas targeted by this assay, and inadequate number of viral copies  (<250 copies / mL). A negative  result must be combined with clinical  observations, patient history, and epidemiological information. If result is POSITIVE SARS-CoV-2 target nucleic acids are DETECTED. The SARS-CoV-2 RNA is generally detectable in upper and lower  respiratory specimens dur ing the acute phase of infection.  Positive  results are indicative of active infection with SARS-CoV-2.  Clinical  correlation with patient history and other diagnostic information is  necessary to determine patient infection status.  Positive results do  not rule out bacterial infection or co-infection with other viruses. If result is PRESUMPTIVE POSTIVE SARS-CoV-2 nucleic acids MAY BE PRESENT.   A presumptive positive result was obtained on the submitted specimen  and confirmed on repeat testing.  While 2019 novel coronavirus  (SARS-CoV-2) nucleic acids may be present in the submitted sample  additional confirmatory testing may be necessary for epidemiological  and / or clinical management purposes  to  differentiate between  SARS-CoV-2 and other Sarbecovirus currently known to infect humans.  If clinically indicated additional testing with an alternate test  methodology [id number]) is advised. The SARS-CoV-2 RNA is generally  detectable in upper and lower respiratory sp ecimens during the acute  phase of infection. The expected result is Negative. Fact Sheet for Patients:  StrictlyIdeas.no Fact Sheet for Healthcare Providers: BankingDealers.co.za This test is not yet approved or cleared by the [place] FDA and has been authorized for detection and/or diagnosis of SARS-CoV-2 by FDA under an Emergency Use Authorization (EUA).  This EUA will remain in effect (meaning this test can be used) for the duration of the COVID-19 declaration under Section 564(b)(1) of the Act, 21 U.S.C. section 360bbb-3(b)(1), unless the authorization is terminated or revoked sooner. Performed at [facility], [address] [place], [place] [address]   Blood culture (routine x 2)     Status: None   Collection Time: 05/15/19 12:59 PM   Specimen: BLOOD RIGHT HAND  Result Value Ref Range Status   Specimen Description BLOOD RIGHT HAND  Final   Special Requests   Final    BOTTLES DRAWN AEROBIC AND ANAEROBIC Blood Culture results may not be optimal due to an inadequate volume of blood received in culture bottles   Culture   Final    NO GROWTH 5 DAYS Performed at [facility], [address] [place], [place] [address]    Report Status 05/20/2019 FINAL  Final  Blood culture (routine x 2)     Status: Abnormal   Collection Time: 05/15/19  1:05 PM   Specimen: BLOOD RIGHT ARM  Result Value Ref Range Status   Specimen Description   Final    BLOOD RIGHT ARM Performed at [facility], [address] [place], [place] [address]    Special Requests   Final    BOTTLES DRAWN AEROBIC AND ANAEROBIC Blood Culture results may not be optimal due to an inadequate volume of  blood received in culture bottles Performed at [facility], [address], [place], [place] [address]    Culture  Setup Time   Final    GRAM POSITIVE COCCI Gram Stain Report Called to,Read Back By and Verified With: BENSON,D. AT 1358 ON 05/16/2019 BY EVA ANAEROBIC BOTTLE ONLY Performed at [facility] BY AND VERIFIED WITH: D BENSON RN 05/16/19 1756 JDW    Culture (A)  Final    STAPHYLOCOCCUS SPECIES (COAGULASE NEGATIVE) THE SIGNIFICANCE OF ISOLATING THIS ORGANISM FROM A SINGLE SET OF BLOOD CULTURES WHEN MULTIPLE SETS ARE DRAWN IS UNCERTAIN. PLEASE NOTIFY THE  MICROBIOLOGY DEPARTMENT WITHIN ONE WEEK IF SPECIATION AND SENSITIVITIES ARE REQUIRED. Performed at [facility], [place] [address], [place], [place] [address]    Report Status 05/18/2019 FINAL  Final  Blood Culture ID Panel (Reflexed)     Status: Abnormal   Collection Time: 05/15/19  1:05 PM  Result Value Ref Range Status   Enterococcus species NOT DETECTED NOT DETECTED Final   Listeria monocytogenes NOT DETECTED NOT DETECTED Final   Staphylococcus species DETECTED (A) NOT DETECTED Final    Comment: Methicillin (oxacillin) resistant coagulase negative staphylococcus. Possible blood culture contaminant (unless isolated from more than one blood culture draw or clinical case suggests pathogenicity). No antibiotic treatment is indicated for blood  culture contaminants. CRITICAL RESULT CALLED TO, READ BACK BY AND VERIFIED WITH: D BENSON RN 05/16/19 1756 JDW    Staphylococcus aureus (BCID) NOT DETECTED NOT DETECTED Final   Methicillin resistance DETECTED (A) NOT DETECTED Final    Comment: CRITICAL RESULT CALLED TO, READ BACK BY AND VERIFIED WITH: D BENSON RN 05/16/19 1756 JDW    Streptococcus species NOT DETECTED NOT DETECTED Final   Streptococcus agalactiae NOT DETECTED NOT DETECTED Final   Streptococcus pneumoniae NOT DETECTED NOT DETECTED Final   Streptococcus pyogenes NOT DETECTED NOT  DETECTED Final   Acinetobacter baumannii NOT DETECTED NOT DETECTED Final   Enterobacteriaceae species NOT DETECTED NOT DETECTED Final   Enterobacter cloacae complex NOT DETECTED NOT DETECTED Final   Escherichia coli NOT DETECTED NOT DETECTED Final   Klebsiella oxytoca NOT DETECTED NOT DETECTED Final   Klebsiella pneumoniae NOT DETECTED NOT DETECTED Final   Proteus species NOT DETECTED NOT DETECTED Final   Serratia marcescens NOT DETECTED NOT DETECTED Final   Haemophilus influenzae NOT DETECTED NOT DETECTED Final   Neisseria meningitidis NOT DETECTED NOT DETECTED Final   Pseudomonas aeruginosa NOT DETECTED NOT DETECTED Final   Candida albicans NOT DETECTED NOT DETECTED Final   Candida glabrata NOT DETECTED NOT DETECTED Final   Candida krusei NOT DETECTED NOT DETECTED Final   Candida parapsilosis NOT DETECTED NOT DETECTED Final   Candida tropicalis NOT DETECTED NOT DETECTED Final    Comment: Performed at [facility], [place]. [address], [place], [place] [address]  MRSA PCR Screening     Status: None   Collection Time: 05/15/19  7:42 PM   Specimen: Nasal Mucosa; Nasopharyngeal  Result Value Ref Range Status   MRSA by PCR NEGATIVE NEGATIVE Final    Comment:        The GeneXpert MRSA Assay (FDA approved for NASAL specimens only), is one component of a comprehensive MRSA colonization surveillance program. It is not intended to diagnose MRSA infection nor to guide or monitor treatment for MRSA infections. Performed at [facility], [address] [place], [place] [address]      Radiology Studies: Nm Hepato Biliary Leak  Result Date: 05/19/2019 CLINICAL DATA:  78 year old male with recent cholecystectomy. Concern for bile leak. EXAM: NUCLEAR MEDICINE HEPATOBILIARY IMAGING TECHNIQUE: Sequential images of the abdomen were obtained out to 120 minutes following intravenous administration of radiopharmaceutical. RADIOPHARMACEUTICALS:  5.2 mCi Tc-32m  Choletec IV COMPARISON:  CT of the  abdomen pelvis dated 05/18/2019 and MRI dated 05/18/2019 FINDINGS: Prompt uptake and biliary excretion of activity by the liver is seen. There is dilatation of the common bile duct status post cholecystectomy. No extra biliary activity identified to suggest bile leak. Biliary activity passes into small bowel, consistent with patent common bile duct. IMPRESSION: No scintigraphic evidence of bile leak.  Electronically Signed   By: Anner Crete M.D.   On: 05/19/2019 20:46     Procedures:  None  Microbiology: Blood culture on 6/23 with coag negative MR staph MRSA PCR negative. COVID-19 negative.  Assessment & Plan: Intra-abdominal fluid collection: possible abscess or bilateral Alekh Recent chole-colo-fistula status post cholecystectomy and partial colectomy by Dr. [name] on 04/23/2019 -Returns with generalized weakness, nausea.   -Septic with elevated liver enzymes on admission. -CT abdomen and pelvis 6/23, repeat CT abdomen and pelvis on 6/26 and MRCP on 6/26 revealed:  -3.5 x 4.3 cm gallbladder fossa complex fluid, intrahepatic biliary, possible biliary leak  -2.8 cm complex fluid collection anterior mesenteric aspect of proximal ascending colon -Fluid collection could be abscesses or bilateral leak -Fluid collection not drainable per discussion between previous attending and IR. -HIDA scan on 6/27 did not reveal biliary leak -Blood cultures from 6/23 coag negative staph in 1 out of 2 bottles likely contaminant -No objective fever throughout his stay.   -Leukocytosis resolved. -IV Zosyn 6/23-- -GI following-may not need ERCP.  Elevated liver enzymes: Resolving. -ERCP on 6/29.  NIDDM-2 with mild hyperglycemia and renal complication -Check F7P -Continue current insulin regimen -Continue statin  CKD 3: Stable.  Mild creatinine uptrending likely from n.p.o. yesterday -Continue monitoring  Hypertension: Normotensive -Continue current regimen  History of GI bleed: H&H stable.   Baseline 8-9. -Check anemia panel  Mild pulmonary hypertension: Echocardiogram in 2017 with EF of 65 to 70%, G1 DD and PASP of 35 mmHg. -No cardiopulmonary symptoms. -Only on Aldactone at home.  Constipation: Resolved with bowel regimen. -MiraLAX twice daily-hold if 2 more bowel movements a day.  DVT prophylaxis: Subcu heparin Code Status: Full code Family Communication: Patient to update family let me know if any question. Disposition Plan: Remains inpatient for further evaluation.  Final disposition SNF likely in the next 24 to 48 hours if stable. Consultants: Gastroenterology, general surgery, IR  Antimicrobials: Anti-infectives (From admission, onward)   Start     Dose/Rate Route Frequency Ordered Stop   05/15/19 2100  piperacillin-tazobactam (ZOSYN) IVPB 3.375 g     3.375 g 12.5 mL/hr over 240 Minutes Intravenous Every 8 hours 05/15/19 1554     05/15/19 1315  piperacillin-tazobactam (ZOSYN) IVPB 3.375 g     3.375 g 100 mL/hr over 30 Minutes Intravenous  Once 05/15/19 1304 05/15/19 1343      Scheduled Meds: . aspirin EC  81 mg Oral Daily  . atorvastatin  40 mg Oral q1800  . bisacodyl  10 mg Rectal Once  . feeding supplement  1 Container Oral TID BM  . feeding supplement (ENSURE ENLIVE)  237 mL Oral BID BM  . heparin injection (subcutaneous)  5,000 Units Subcutaneous Q8H  . hydrALAZINE  100 mg Oral TID  . insulin aspart  0-9 Units Subcutaneous Q4H  . isosorbide mononitrate  60 mg Oral Daily  . lactulose  30 g Oral Once  . pantoprazole  40 mg Oral Daily  . polyethylene glycol  17 g Oral BID  . verapamil  240 mg Oral Daily   Continuous Infusions: . piperacillin-tazobactam (ZOSYN)  IV 3.375 g (05/19/19 2253)   PRN Meds:.acetaminophen **OR** acetaminophen, labetalol, ondansetron **OR** ondansetron (ZOFRAN) IV  35 minutes with more than 50% spent in reviewing records, counseling patient and coordinating care.  Shawnetta Lein T. Soudan  If 7PM-7AM, please  contact night-coverage [email or web address] Password [email or web address] 05/20/2019, 7:01 AM

## 2019-05-20 NOTE — Progress Notes (Signed)
[facility] PROGRESS NOTE Subjective Patient feels well has no complaints.  Hepatobiliary scan negative for bile leak  Objective: Vital signs in last 24 hours: Temp:  [97.5 F (36.4 C)-98.9 F (37.2 C)] 98.4 F (36.9 C) (06/28 0800) Pulse Rate:  [90-118] 92 (06/28 0800) Resp:  [14-22] 20 (06/28 0800) BP: (113-181)/(75-94) 135/82 (06/28 0800) SpO2:  [96 %-100 %] 100 % (06/28 0800) Last BM Date: 05/18/19  Intake/Output from previous day: 06/27 0701 - 06/28 0700 In: -  Out: 725 [Urine:725] Intake/Output this shift: No intake/output data recorded.  PE: General--eating liquid breakfast without problems  Abdomen--soft and nontender  Lab Results: Recent Labs    05/18/19 0820 05/19/19 0820 05/20/19 0638  WBC 12.6* 12.8* 10.5  HGB 8.6* 8.9* 8.3*  HCT 28.1* 28.8* 26.5*  PLT 495* 464* 430*   BMET Recent Labs    05/18/19 0820 05/19/19 0820 05/20/19 0638  NA 139 139 139  K 4.2 4.1 4.0  CL 104 101 102  CO2 25 26 25   CREATININE 1.25* 1.23 1.33*   LFT Recent Labs    05/18/19 0820 05/19/19 0820 05/20/19 0638  PROT 7.1 6.8 6.4*  AST 48* 36 29  ALT 92* 75* 57*  ALKPHOS 104 100 85  BILITOT 0.2* 0.5 0.4   PT/INR No results for input(s): LABPROT, INR in the last 72 hours. PANCREAS No results for input(s): LIPASE in the last 72 hours.       Studies/Results: Ct Abdomen Pelvis W Contrast  Result Date: 05/18/2019 CLINICAL DATA:  Abdominal pain, status post cholecystectomy and partial colectomy. EXAM: CT ABDOMEN AND PELVIS WITH CONTRAST TECHNIQUE: Multidetector CT imaging of the abdomen and pelvis was performed using the standard protocol following bolus administration of intravenous contrast. CONTRAST:  11mL OMNIPAQUE IOHEXOL 300 MG/ML SOLN, 149mL OMNIPAQUE IOHEXOL 300 MG/ML SOLN COMPARISON:  05/15/2019, 04/19/2019 FINDINGS: Lower chest: Small right pleural effusion with basilar atelectasis. Trace left pleural effusion. Hepatobiliary: No focal mass.  Interval development of air within the intrahepatic biliary collecting system 3.5 x 4.2 cm with mild dilatation of the central biliary collecting system may reflect developing cholangitis in the absence of recent instrumentation. Complex fluid collection in the gallbladder fossa with the overall size unchanged, but increase in the amount of fluid within the collection most consistent with an abscess. Mild surrounding inflammatory changes. Pancreas: Unremarkable. No pancreatic ductal dilatation or surrounding inflammatory changes. Spleen: Normal in size without focal abnormality. Adrenals/Urinary Tract: Normal adrenal glands. Stable 2 cm left renal cyst. Kidneys otherwise normal. No urolithiasis or obstructive uropathy. Normal bladder. Stomach/Bowel: No bowel dilatation to suggest bowel obstruction. Gastric air-fluid level with wall thickening of the antrum and proximal duodenum which may reflect an element of gastric outlet obstruction secondary to adjacent inflammatory changes. Partial ascending colectomy a with adjacent post surgical changes. Large amount of stool throughout the colon. Normal appendix. Stable 22 x 15 mm complex fluid collection along the antimesenteric aspect of the proximal ascending colon along the anterior abdominal wall most consistent with a small abscess. Vascular/Lymphatic: Normal caliber abdominal aorta with mild atherosclerosis. No lymphadenopathy. Reproductive: Prostate is unremarkable. Other: No abdominal wall hernia or abnormality. No abdominopelvic ascites. Postsurgical changes in the right anterior abdominal wall. 18 x 15 mm complex fluid in the anterior abdominal wall subcutaneous fat along the surgical site likely reflecting a small hematoma. Musculoskeletal: No acute osseous abnormality. No aggressive osseous lesion. Degenerative disease with disc height loss and facet arthropathy at L2-3, L3-4, L4-5 and L5-S1. Bilateral foraminal stenosis at L4-5  and L5-S1. IMPRESSION: 1.  Interval development of air within the intrahepatic biliary collecting system 3.5 x 4.2 cm with mild dilatation of the central biliary collecting system may reflect developing cholangitis in the absence of recent instrumentation. 2. 3.5 x 4.2 cm complex fluid collection in the gallbladder fossa with the overall size unchanged, but there is increase in the amount of fluid within the collection most consistent with an abscess. Mild surrounding inflammatory changes. 3. Stable 22 x 15 mm complex fluid collection along the antimesenteric aspect of the proximal ascending colon along the anterior abdominal wall most consistent with a small abscess. 4. Gastric air-fluid level with wall thickening of the antrum and proximal duodenum which may reflect an element of gastric outlet obstruction secondary to adjacent inflammatory changes. 5.  Aortic Atherosclerosis (ICD10-I70.0). 6. Large amount of stool throughout the colon. 7. Postsurgical changes the site partial ascending colectomy. Electronically Signed   By: Kathreen Devoid   On: 05/18/2019 11:47   Mr 3d Recon At Scanner  Result Date: 05/18/2019 CLINICAL DATA:  status post open cholecystectomy with partial colectomy for a cholecysto-colo fistula on April 23, 2019. Now with rim enhancing fluid collection in the gallbladder fossa on recent CT. EXAM: MRI ABDOMEN WITHOUT AND WITH CONTRAST (INCLUDING MRCP) TECHNIQUE: Multiplanar multisequence MR imaging of the abdomen was performed both before and after the administration of intravenous contrast. Heavily T2-weighted images of the biliary and pancreatic ducts were obtained, and three-dimensional MRCP images were rendered by post processing. CONTRAST:  10 cc Gadavist COMPARISON:  CT scan earlier today. FINDINGS: Exam is markedly degraded by motion secondary to patient inability to reproducibly breath hold. Lower chest: Small right and tiny left pleural effusion. Dependent atelectasis noted right lower lobe. Hepatobiliary: No gross  focal enhancing lesion within the liver. Mild intrahepatic biliary duct dilatation is associated with extrahepatic common duct measuring up to 6 mm diameter. Areas of signal void within the hepatic ductal anatomy compatible with gas as seen on CT earlier today. 3.5 by 3.6 cm fluid collection with gas identified in the gallbladder fossa. Pancreas: Nondiagnostic imaging due to motion artifact. No gross main pancreatic ductal dilatation. Spleen:  No gross abnormality within the spleen. Adrenals/Urinary Tract: No adrenal nodule or mass. 2.7 cm cystic lesion in the upper pole right kidney with some probable septation along the internal wall. This is contiguous with and possibly insinuating around a upper pole collecting system, but enhancing mural nodule not completely excluded. 2.7 cm simple cyst noted lower pole left kidney. Renal assessment limited by the substantial motion artifact and tiny renal lesions identified on today's study cannot be definitively characterized. Stomach/Bowel: Moderate gastric distention. Fine detail of bowel loops in the abdomen obscured by motion artifact, but no gross small bowel or colonic dilatation within the visualized abdomen. Vascular/Lymphatic: No evidence for abdominal aortic aneurysm. No abdominal lymphadenopathy evident. Other:  No intraperitoneal free fluid. Musculoskeletal: No abnormal marrow enhancement within the visualized bony anatomy. IMPRESSION: 1. 3.5 x 3.6 cm rim enhancing fluid collection identified in the gallbladder fossa. Imaging features remain compatible with abscess. 2. Mild prominence of intrahepatic bile ducts without substantial extrahepatic biliary duct dilatation. Areas of signal void within the hepatic ducts compatible with pneumobilia. 3. 2.7 cm cystic lesion upper pole right kidney with some internal septation along the inferior margin. There may be an enhancing mural nodule or this cyst may insinuate around a renal calyx. Repeat MRI in 3-6 months  recommended to ensure stability a cystic renal cell neoplasm not excluded.  4. Small bilateral pleural effusions, right greater than left. Electronically Signed   By: Misty Stanley M.D.   On: 05/18/2019 15:33   Mr Abdomen Mrcp Moise Boring Contast  Result Date: 05/18/2019 CLINICAL DATA:  status post open cholecystectomy with partial colectomy for a cholecysto-colo fistula on April 23, 2019. Now with rim enhancing fluid collection in the gallbladder fossa on recent CT. EXAM: MRI ABDOMEN WITHOUT AND WITH CONTRAST (INCLUDING MRCP) TECHNIQUE: Multiplanar multisequence MR imaging of the abdomen was performed both before and after the administration of intravenous contrast. Heavily T2-weighted images of the biliary and pancreatic ducts were obtained, and three-dimensional MRCP images were rendered by post processing. CONTRAST:  10 cc Gadavist COMPARISON:  CT scan earlier today. FINDINGS: Exam is markedly degraded by motion secondary to patient inability to reproducibly breath hold. Lower chest: Small right and tiny left pleural effusion. Dependent atelectasis noted right lower lobe. Hepatobiliary: No gross focal enhancing lesion within the liver. Mild intrahepatic biliary duct dilatation is associated with extrahepatic common duct measuring up to 6 mm diameter. Areas of signal void within the hepatic ductal anatomy compatible with gas as seen on CT earlier today. 3.5 by 3.6 cm fluid collection with gas identified in the gallbladder fossa. Pancreas: Nondiagnostic imaging due to motion artifact. No gross main pancreatic ductal dilatation. Spleen:  No gross abnormality within the spleen. Adrenals/Urinary Tract: No adrenal nodule or mass. 2.7 cm cystic lesion in the upper pole right kidney with some probable septation along the internal wall. This is contiguous with and possibly insinuating around a upper pole collecting system, but enhancing mural nodule not completely excluded. 2.7 cm simple cyst noted lower pole left kidney.  Renal assessment limited by the substantial motion artifact and tiny renal lesions identified on today's study cannot be definitively characterized. Stomach/Bowel: Moderate gastric distention. Fine detail of bowel loops in the abdomen obscured by motion artifact, but no gross small bowel or colonic dilatation within the visualized abdomen. Vascular/Lymphatic: No evidence for abdominal aortic aneurysm. No abdominal lymphadenopathy evident. Other:  No intraperitoneal free fluid. Musculoskeletal: No abnormal marrow enhancement within the visualized bony anatomy. IMPRESSION: 1. 3.5 x 3.6 cm rim enhancing fluid collection identified in the gallbladder fossa. Imaging features remain compatible with abscess. 2. Mild prominence of intrahepatic bile ducts without substantial extrahepatic biliary duct dilatation. Areas of signal void within the hepatic ducts compatible with pneumobilia. 3. 2.7 cm cystic lesion upper pole right kidney with some internal septation along the inferior margin. There may be an enhancing mural nodule or this cyst may insinuate around a renal calyx. Repeat MRI in 3-6 months recommended to ensure stability a cystic renal cell neoplasm not excluded. 4. Small bilateral pleural effusions, right greater than left. Electronically Signed   By: Misty Stanley M.D.   On: 05/18/2019 15:33   Nm Hepato Biliary Leak  Result Date: 05/19/2019 CLINICAL DATA:  78 year old male with recent cholecystectomy. Concern for bile leak. EXAM: NUCLEAR MEDICINE HEPATOBILIARY IMAGING TECHNIQUE: Sequential images of the abdomen were obtained out to 120 minutes following intravenous administration of radiopharmaceutical. RADIOPHARMACEUTICALS:  5.2 mCi Tc-22m  Choletec IV COMPARISON:  CT of the abdomen pelvis dated 05/18/2019 and MRI dated 05/18/2019 FINDINGS: Prompt uptake and biliary excretion of activity by the liver is seen. There is dilatation of the common bile duct status post cholecystectomy. No extra biliary activity  identified to suggest bile leak. Biliary activity passes into small bowel, consistent with patent common bile duct. IMPRESSION: No scintigraphic evidence of bile leak. Electronically Signed  By: Anner Crete M.D.   On: 05/19/2019 20:46    Medications: I have reviewed the patient's current medications.  Assessment:  1.  Fluid collection gallbladder fossa.  Hepatobiliary scan does not show any evidence of bile leak.  This probably represents either an abscess that seems to be getting better with antibiotics or a previous bile leak that is now sealed off.  At this point do not feel that ERCP is needed.   Plan: We will go ahead and advance to low-fat diet.  We will check tomorrow but if continues to do well could likely be discharged or sent back to the rehab facility.   Nancy Fetter 05/20/2019, 10:54 AM  This note was created using voice recognition software. Minor errors may Have occurred unintentionally.  Pager: [phone] If no answer or after hours call [phone]

## 2019-05-21 DIAGNOSIS — D631 Anemia in chronic kidney disease: Secondary | ICD-10-CM

## 2019-05-21 DIAGNOSIS — D500 Iron deficiency anemia secondary to blood loss (chronic): Secondary | ICD-10-CM

## 2019-05-21 LAB — COMPREHENSIVE METABOLIC PANEL
ALT: 46 U/L — ABNORMAL HIGH (ref 0–44)
AST: 23 U/L (ref 15–41)
Albumin: 2.3 g/dL — ABNORMAL LOW (ref 3.5–5.0)
Alkaline Phosphatase: 81 U/L (ref 38–126)
Anion gap: 9 (ref 5–15)
BUN: 16 mg/dL (ref 8–23)
CO2: 26 mmol/L (ref 22–32)
Calcium: 8.2 mg/dL — ABNORMAL LOW (ref 8.9–10.3)
Chloride: 104 mmol/L (ref 98–111)
Creatinine, Ser: 1.62 mg/dL — ABNORMAL HIGH (ref 0.61–1.24)
GFR calc Af Amer: 46 mL/min — ABNORMAL LOW (ref >60)
GFR calc non Af Amer: 40 mL/min — ABNORMAL LOW (ref >60)
Glucose, Bld: 196 mg/dL — ABNORMAL HIGH (ref 70–99)
Potassium: 4 mmol/L (ref 3.5–5.1)
Sodium: 139 mmol/L (ref 135–145)
Total Bilirubin: 0.2 mg/dL — ABNORMAL LOW (ref 0.3–1.2)
Total Protein: 6.2 g/dL — ABNORMAL LOW (ref 6.5–8.1)

## 2019-05-21 LAB — GLUCOSE, CAPILLARY
Glucose-Capillary: 177 mg/dL — ABNORMAL HIGH (ref 70–99)
Glucose-Capillary: 190 mg/dL — ABNORMAL HIGH (ref 70–99)
Glucose-Capillary: 220 mg/dL — ABNORMAL HIGH (ref 70–99)

## 2019-05-21 LAB — CBC
HCT: 25.8 % — ABNORMAL LOW (ref 39.0–52.0)
Hemoglobin: 7.9 g/dL — ABNORMAL LOW (ref 13.0–17.0)
MCH: 28 pg (ref 26.0–34.0)
MCHC: 30.6 g/dL (ref 30.0–36.0)
MCV: 91.5 fL (ref 80.0–100.0)
Platelets: 403 10*3/uL — ABNORMAL HIGH (ref 150–400)
RBC: 2.82 MIL/uL — ABNORMAL LOW (ref 4.22–5.81)
RDW: 15.4 % (ref 11.5–15.5)
WBC: 11.3 10*3/uL — ABNORMAL HIGH (ref 4.0–10.5)
nRBC: 0 % (ref 0.0–0.2)

## 2019-05-21 LAB — MAGNESIUM: Magnesium: 1.8 mg/dL (ref 1.7–2.4)

## 2019-05-21 SURGERY — ERCP, WITH INTERVENTION IF INDICATED
Anesthesia: General

## 2019-05-21 MED ORDER — ISOSORBIDE MONONITRATE ER 60 MG PO TB24: 60.0000 mg | ORAL_TABLET | Freq: Every day | ORAL | 1 refills | Status: DC

## 2019-05-21 MED ORDER — AMOXICILLIN-POT CLAVULANATE 875-125 MG PO TABS: 1.0000 | ORAL_TABLET | Freq: Two times a day (BID) | ORAL | 0 refills | Status: DC

## 2019-05-21 MED ORDER — POLYETHYLENE GLYCOL 3350 17 GM/SCOOP PO POWD: ORAL | 0 refills | Status: DC

## 2019-05-21 MED ORDER — SODIUM CHLORIDE 0.9 % IV SOLN
510.0000 mg | Freq: Once | INTRAVENOUS | Status: AC
  Administered 2019-05-21: 510 mg via INTRAVENOUS
  Filled 2019-05-21: qty 17

## 2019-05-21 MED FILL — AMOX-CLAV 875-125 MG TABLET: 875-125 | 5 days supply | Qty: 10 | Fill #0

## 2019-05-21 MED FILL — POLYETHYLENE GLYCOL 3350 PO: 17 | 7 days supply | Qty: 238 | Fill #0

## 2019-05-21 MED FILL — ISOSORBIDE MN ER 60 MG TAB: 60 | 90 days supply | Qty: 90 | Fill #0

## 2019-05-21 NOTE — Care Management Important Message (Signed)
Important Message  Patient Details  Name: Stephen BOGAN Sr. MRN: [id number] Date of Birth: September 09, 1941   Medicare Important Message Given:  Yes     Memory Argue 05/21/2019, 3:45 PM

## 2019-05-21 NOTE — Evaluation (Signed)
Occupational Therapy Evaluation Patient Details Name: Stephen WISHON Sr. MRN: [id number] DOB: [birth year] Today's Date: 05/21/2019    History of Present Illness 78 year old male with history of DM-2, HTN, CKD 3, GI bleed, DVT in 2017, recent hospitalization 5/27-6/11 for rectal bleed requiring 5 units, and chole-colo-fistula requiring open cholecystectomy and partial colectomy on 07/23/6944 complicated by AKI. Pt discharged to SNF; now returned to [facility] on 6/23 due to general weakness, ongoing nausea, urinary and bowel incontinence, elevated liver enzymes with leukocytosis. Patient had repeat CT abdomen and pelvis on 6/26 that revealed persistent previously seen fluid collections/abscesses, interval development of intrahepatic pneumobilia and biliary leak.  Previous provider discussed with [facility], Dr. Oletta Lamas who suggested transfer to [facility] for possible ERCP.    Clinical Impression   This 78 y/o male presents with the above. Pt was most recently in SNF for ST rehab, reports using RW for mobility during this time and with some assist for ADL. Pt reports prior to initial hospitalization in June he was independent with ADL and mobility. Pt presents seated in recliner willing to participate in therapy session. Pt completing room level mobility using RW this session initially with minA progressed to close minguard assist throughout. He currently requires setup assist for seated UB ADL, minA for LB ADL. Pt expressing strong desire to return home vs SNF at time of discharge; reports he has good family support for ADL/iADL PRN. He will benefit from continued acute OT services and recommend follow up [facility] services after discharge to maximize his safety and independence with ADL and mobility. Will follow.     Follow Up Recommendations  Home health OT;Supervision/Assistance - 24 hour    Equipment Recommendations  None recommended by OT           Precautions / Restrictions  Precautions Precautions: Fall Restrictions Weight Bearing Restrictions: No      Mobility Bed Mobility               General bed mobility comments: pt received OOB in recliner  Transfers Overall transfer level: Needs assistance Equipment used: Rolling walker (2 wheeled) Transfers: Sit to/from Stand Sit to Stand: Min guard         General transfer comment: VCs for safe hand placement, close minguard for safety, increased time/effort but no physical assist required    Balance Overall balance assessment: Needs assistance Sitting-balance support: Feet supported;No upper extremity supported Sitting balance-Leahy Scale: Good     Standing balance support: Bilateral upper extremity supported Standing balance-Leahy Scale: Poor                             ADL either performed or assessed with clinical judgement   ADL Overall ADL's : Needs assistance/impaired Eating/Feeding: Modified independent;Sitting   Grooming: Set up;Sitting   Upper Body Bathing: Set up;Sitting   Lower Body Bathing: Minimal assistance;Sit to/from stand   Upper Body Dressing : Set up;Sitting   Lower Body Dressing: Minimal assistance;Sit to/from stand Lower Body Dressing Details (indicate cue type and reason): minguard-minA standing balance Toilet Transfer: Minimal assistance;Min guard;Ambulation;RW Toilet Transfer Details (indicate cue type and reason): simulated via transfer to/from recliner, room level mobility Toileting- Clothing Manipulation and Hygiene: Minimal assistance;Sit to/from stand       Functional mobility during ADLs: Minimal assistance;Min guard;Rolling walker  Pertinent Vitals/Pain Pain Assessment: No/denies pain     Hand Dominance     Extremity/Trunk Assessment Upper Extremity Assessment Upper Extremity Assessment: Overall WFL for tasks assessed   Lower Extremity Assessment Lower Extremity Assessment: Defer to PT  evaluation   Cervical / Trunk Assessment Cervical / Trunk Assessment: Kyphotic   Communication Communication Communication: No difficulties   Cognition Arousal/Alertness: Awake/alert Behavior During Therapy: WFL for tasks assessed/performed Overall Cognitive Status: Within Functional Limits for tasks assessed                                     General Comments       Exercises     Shoulder Instructions      Home Living Family/patient expects to be discharged to:: Private residence Living Arrangements: Children;Other (Comment)(adult nephew and 2 adult grandsons, sister) Available Help at Discharge: Family;Available 24 hours/day Type of Home: House Home Access: Level entry     Home Layout: One level;Laundry or work area in [place] Shower/Tub: Teacher, early years/pre: [place]: Environmental consultant - 2 wheels;Cane - single point;Bedside commode;Shower seat          Prior Functioning/Environment Level of Independence: Independent with assistive device(s)  Gait / Transfers Assistance Needed: pt most recently using RW for mobility, prior to initial hospitalization pt ambulating without AD ADL's / Homemaking Assistance Needed: family assist for some iADL            OT Problem List: Decreased strength;Decreased range of motion;Impaired balance (sitting and/or standing);Decreased activity tolerance;Decreased safety awareness;Decreased knowledge of use of DME or AE      OT Treatment/Interventions: Self-care/ADL training;Therapeutic exercise;DME and/or AE instruction;Therapeutic activities;Patient/family education;Balance training    OT Goals(Current goals can be found in the care plan section) Acute Rehab OT Goals Patient Stated Goal: "home" OT Goal Formulation: With patient Time For Goal Achievement: 06/04/19 Potential to Achieve Goals: Good  OT Frequency: Min 2X/week   Barriers to D/C:            Co-evaluation               AM-PAC OT "6 Clicks" Daily Activity     Outcome Measure Help from another person eating meals?: None Help from another person taking care of personal grooming?: None Help from another person toileting, which includes using toliet, bedpan, or urinal?: A Little Help from another person bathing (including washing, rinsing, drying)?: A Little Help from another person to put on and taking off regular upper body clothing?: None Help from another person to put on and taking off regular lower body clothing?: A Little 6 Click Score: 21   End of Session Equipment Utilized During Treatment: Gait belt;Rolling walker Nurse Communication: Mobility status  Activity Tolerance: Patient tolerated treatment well Patient left: in chair;with call bell/phone within reach  OT Visit Diagnosis: Unsteadiness on feet (R26.81)                Time: 9485-4627 OT Time Calculation (min): 29 min Charges:  OT General Charges $OT Visit: 1 Visit OT Evaluation $OT Eval Moderate Complexity: 1 Mod OT Treatments $Self Care/Home Management : 8-22 mins  Stephen Rogers, OT Supplemental Rehabilitation Services Pager [phone] Office [phone]   Stephen Rogers 05/21/2019, 12:08 PM

## 2019-05-21 NOTE — TOC Transition Note (Signed)
Transition of Care (TOC) - CM/SW Discharge Note   Patient Details  Name: Stephen ODETTE Sr. MRN: [id number] Date of Birth: Apr 15, 1941  Transition of Care [facility]) CM/SW Contact:  Alexander Mt, [place] Phone Number: 05/21/2019, 2:22 PM   Clinical Narrative:    Pt sister given choice for [facility]; no preference, they have had [facility] before and is amenable to referral given again. [facility] can staff PT/OT for pt also, at current time they cannot staff nursing, pt will be referred if needed. Updated pt home address to [address] [place], [place], [address]   Final next level of care: Home/Self Care Barriers to Discharge: Continued Medical Work up   Patient Goals and CMS Choice Patient states their goals for this hospitalization and ongoing recovery are:: to go home, not back to SNF CMS Medicare.gov Compare Post Acute Care list provided to:: Patient Choice offered to / list presented to : Patient  Discharge Placement  Patient to be transferred to facility by: pt sister Stephen Rogers Name of family member notified: pt sister Stephen Rogers Patient and family notified of of transfer: 05/21/19  Discharge Plan and Services In-house Referral: Clinical Social Work Discharge Planning Services: [employer] Consult Post Acute Care Choice: Home Health, Durable Medical Equipment                    HH Arranged: PT, OT [place] Agency: [facility] (formerly [employer]) Date [place]: 05/21/19 Time [place]: [place] Representative spoke with at [place]: [place] ([place]) Interventions     Readmission Risk Interventions Readmission Risk Prevention Plan 05/16/2019 04/25/2019  Transportation Screening Complete Complete  PCP or Specialist Appt within 3-5 Days Complete -  Home Care Screening - Complete  HRI or Home Care Consult Not Complete -  Social Work Consult for Recovery Care Planning/Counseling Complete -  Palliative Care Screening Not Complete -   Medication Review Press photographer) Complete -  Some recent data might be hidden

## 2019-05-21 NOTE — Progress Notes (Signed)
[facility] Progress Note  Stephen STUDSTILL Sr. 78 y.o. [birth year]  CC: Fluid collection in gallbladder fossa.   Subjective: Patient doing well from GI standpoint.  Denies abdominal pain.  Tolerating diet.  Complaining of constipation with no bowel movement for last 2 days.  ROS : Negative for acute chest pain or shortness of breath.   Objective: Vital signs in last 24 hours: Vitals:   05/21/19 0254 05/21/19 0825  BP: 132/70 (!) 152/81  Pulse: 96 86  Resp:  20  Temp: 98.9 F (37.2 C) 98.1 F (36.7 C)  SpO2: 100% 99%    Physical Exam:  General.  Resting comfortably in the bed.  Not in acute distress Abdomen.  Mild fullness at right upper quadrant noted without any obvious tenderness, abdomen is otherwise soft, bowel sounds present.  No peritoneal signs. Lower extremity.  No significant edema  Lab Results: Recent Labs    05/20/19 0638 05/21/19 0530  NA 139 139  K 4.0 4.0  CL 102 104  CO2 25 26  GLUCOSE 165* 196*  BUN 9 16  CREATININE 1.33* 1.62*  CALCIUM 8.5* 8.2*  MG 1.7 1.8   Recent Labs    05/20/19 0638 05/21/19 0530  AST 29 23  ALT 57* 46*  ALKPHOS 85 81  BILITOT 0.4 0.2*  PROT 6.4* 6.2*  ALBUMIN 2.3* 2.3*   Recent Labs    05/20/19 0638 05/21/19 0530  WBC 10.5 11.3*  HGB 8.3* 7.9*  HCT 26.5* 25.8*  MCV 90.1 91.5  PLT 430* 403*   No results for input(s): LABPROT, INR in the last 72 hours.    Assessment/Plan: -Fluid collection in gallbladder fossa.  Most likely abscess based on MRI finding.  Hepatobiliary scan negative for bile leak .  T bili 0.2. -Constipation. - status post open cholecystectomy with partial colectomy for a cholecysto-colo fistula on June 1  Recommendations ----------------------- -No further GI work-up planned. -As needed MiraLAX for constipation. -GI will sign off.  Call us back if needed.   Otis Brace MD, [place] 05/21/2019, 10:26 AM  Contact #  [phone]

## 2019-05-21 NOTE — Progress Notes (Signed)
Inpatient Diabetes Program Recommendations  AACE/ADA: New Consensus Statement on Inpatient Glycemic Control  Target Ranges:  Prepandial:   less than 140 mg/dL      Peak postprandial:   less than 180 mg/dL (1-2 hours)      Critically ill patients:  140 - 180 mg/dL   Results for Stephen Rogers, Stephen SR. (MRN [id number]) as of 05/21/2019 08:01  Ref. Range 05/20/2019 07:57 05/20/2019 12:23 05/20/2019 16:02 05/20/2019 20:10 05/20/2019 23:40 05/21/2019 02:52  Glucose-Capillary Latest Ref Range: 70 - 99 mg/dL 141 (H) 202 (H) 231 (H) 218 (H) 191 (H) 177 (H)   Review of Glycemic Control  Diabetes history:DM2 Outpatient Diabetes medications: Metformin 500 mg BID Current orders for Inpatient glycemic control: Novolog 0-9 units Q4H  Inpatient Diabetes Program Recommendations:   Insulin- Correction: Please consider changing CBGs and Novolog correction to AC&HS.  Insulin-Meal Coverage: Please consider ordering Novolog 3 units TID with meals for meal coverage if patient eats at least 50% of meals.  Thanks, Barnie Alderman, RN, MSN, CDE Diabetes Coordinator Inpatient Diabetes Program [phone] (Team Pager from 8am to 5pm)

## 2019-05-21 NOTE — TOC Initial Note (Signed)
Transition of Care (TOC) - Initial/Assessment Note    Patient Details  Name: Stephen DURNIN Sr. MRN: [id number] Date of Birth: June 10, 1941  Transition of Care [facility]) CM/SW Contact:    Alexander Mt, [place] Phone Number: 05/21/2019, 11:21 AM  Clinical Narrative:                 Spoke with pt at bedside, pt from home with support of family including grandsons, nephew, and sister. CSW introduced self, role, reason for visit. Pt has been at [facility] but prefers to return home with assistance from family. Pt denies any DME needs and wants CSW to call his sister Stephen Rogers, call and HIPAA compliant message left on [phone]. Pt states he has had [facility] in the past.   If pt can safely return home will need [facility] therapy orders as well as face to face for Medicare.   Expected Discharge Plan: [place] Barriers to Discharge: Continued Medical Work up   Patient Goals and CMS Choice Patient states their goals for this hospitalization and ongoing recovery are:: to go home, not back to SNF CMS Medicare.gov Compare Post Acute Care list provided to:: Patient Choice offered to / list presented to : Patient  Expected Discharge Plan and Services Expected Discharge Plan: [place] In-house Referral: Clinical Social Work Discharge Planning Services: CM Consult Post Acute Care Choice: Home Health, Durable Medical Equipment Living arrangements for the past 2 months: [place], [place] Expected Discharge Date: 05/21/19                                    Prior Living Arrangements/Services Living arrangements for the past 2 months: [place], [place] Lives with:: Relatives Patient language and need for interpreter reviewed:: Yes(no needs) Do you feel safe going back to the place where you live?: Yes      Need for Family Participation in Patient Care: Yes (Comment)(assistance;  supervision) Care giver support system in place?: Yes (comment)(grandchildren, nephew, sister)   Criminal Activity/Legal Involvement Pertinent to Current Situation/Hospitalization: No - Comment as needed  Activities of Daily Living Home Assistive Devices/Equipment: Environmental consultant (specify type), Cane (specify quad or straight)(Pt states he doesnt use them at this time) ADL Screening (condition at time of admission) Patient's cognitive ability adequate to safely complete daily activities?: Yes Is the patient deaf or have difficulty hearing?: No Does the patient have difficulty seeing, even when wearing glasses/contacts?: No Does the patient have difficulty concentrating, remembering, or making decisions?: No Patient able to express need for assistance with ADLs?: Yes Does the patient have difficulty dressing or bathing?: No Independently performs ADLs?: Yes (appropriate for developmental age) Does the patient have difficulty walking or climbing stairs?: No Weakness of Legs: None Weakness of Arms/Hands: None  Permission Sought/Granted Permission sought to share information with : Facility Sport and exercise psychologist, Family Supports Permission granted to share information with : Yes, Verbal Permission Granted  Share Information with NAME: Stephen Rogers     Permission granted to share info w Relationship: sister  Permission granted to share info w Contact Information: [phone]  Emotional Assessment Appearance:: Appears stated age Attitude/Demeanor/Rapport: Engaged, Self-Confident Affect (typically observed): Accepting, Adaptable, Appropriate Orientation: : Oriented to Self, Oriented to Place, Oriented to Situation, Oriented to  Time Alcohol / Substance Use: Not Applicable Psych Involvement: No (comment)  Admission diagnosis:  Postoperative  intra-abdominal abscess [T81.49XA] Hyperglycemia [R73.9] Intra-abdominal abscess (HCC) [K65.1] Elevated LFTs [R94.5] Patient Active Problem List    Diagnosis Date Noted  . Elevated liver enzymes 05/15/2019  . Intra-abdominal abscess ([place]) 05/15/2019  . CKD stage 3 due to type 2 diabetes mellitus ([place]) 05/07/2019  . Dyslipidemia associated with type 2 diabetes mellitus ([place]) 05/07/2019  . Hypertension associated with stage 3 chronic kidney disease due to type 2 diabetes mellitus ([place]) 05/07/2019  . GERD without esophagitis 05/07/2019  . Calculus of gallbladder with cholecystitis without biliary obstruction   . Choloenteric fistula   . History of cholecystitis   . Rectal bleeding 04/18/2019  . Anemia 04/18/2019  . Acute GI bleeding   . Cholecystitis   . Fever   . At high risk for fluid overload 10/13/2016  . Fluid overload 10/13/2016  . Bilateral lower extremity edema 10/13/2016  . Gait disturbance 10/13/2016  . Leukocytosis 10/13/2016  . CKD (chronic kidney disease), stage III ([place]) 10/13/2016  . Left leg DVT ([place]) 10/13/2016  . Acute renal failure (ARF) ([place]) 10/08/2016  . Nausea and vomiting 10/08/2016  . Type 2 diabetes mellitus, controlled, with renal complications ([place]) 61/44/3154  . Transaminitis 10/08/2016  . Hyperlipemia 10/08/2016  . Renal insufficiency   . Dehydration 10/07/2016   PCP:  [name], MD Pharmacy:   [facility] - [place], [place] [address] Phone: [phone] Fax: [phone]  [place] [address] [place] - [place] [address] [place] [address] [place] [address] [place] [address] Phone: [phone] Fax: [phone]  [facility] [place], [place] - [address] [place] [address] Phone: [phone] Fax: [phone]     Social Determinants of Health (SDOH) Interventions    Readmission Risk Interventions Readmission Risk Prevention Plan 05/16/2019 04/25/2019  Transportation Screening Complete Complete  PCP or Specialist Appt within 3-5 Days Complete -  Home Care  Screening - Complete  HRI or Home Care Consult Not Complete -  Social Work Consult for Recovery Care Planning/Counseling Complete -  Palliative Care Screening Not Complete -  Medication Review Press photographer) Complete -  Some recent data might be hidden

## 2019-05-21 NOTE — Discharge Summary (Signed)
Physician Discharge Summary  Stephen Rogers Sr. ION:[id number] DOB: May 13, 1941 DOA: 05/15/2019  PCP: [name], MD  Admit date: 05/15/2019 Discharge date: 05/21/2019  Admitted From: Home Disposition: Home  Recommendations for Outpatient Follow-up:  1. Follow up with PCP within 1 week. 2. Please obtain CBC/CMP/Mag at follow up 3. Please follow up on the following pending results: None  Home Health: PT/OT Equipment/Devices: None  Discharge Condition: Stable CODE STATUS: Full code  Hospital Course: 78 year old male with history of DM-2, HTN, CKD 3, GI bleed, DVT in 2017, recent hospitalization 5/27-6/11 for rectal bleed requiring 5 units, and chole-colo-fistula requiring open cholecystectomy and partial colectomy on 12/26/4008 complicated by AKI.   Patient returned to antipain with general weakness, ongoing nausea, urinary and bowel incontinence, elevated liver enzymes with leukocytosis.  On admission, hemodynamically stable except for intermittent tachycardia.  WBC 17.6.  Lactic acid 2.1.  AST 92.  ALT 149.  ALP 108.  Total bili 0.5.  CT abdomen and pelvis reveals 3.5 x 4.3 cm complex fluid collection in gallbladder fossa concerning for abscess.  No CT evidence of cholangitis.  Also some concern about gastric outlet obstruction, mild colitis, 2.8 cm fluid collection anterior mesenteric aspect of proximal ascending colon concerning for abscess. Patient was started on IV Zosyn.  General surgery consulted who recommended IR drainage of the fluid collections.  IR, Dr. Pascal Lux consulted and recommended antibiotic versus percutaneous drainage.  Patient's blood culture from 6/23 grew coag negative staph in 1 out of 2 bottles thought to be contaminant.  Patient had repeat CT abdomen and pelvis, and MRCP on 6/26 that revealed persistent previously seen fluid collections/abscesses, interval development of intrahepatic pneumobilia and biliary leak.  Previous provider discussed with [facility],  Dr. Oletta Lamas who suggested transfer to [facility] for possible ERCP.   Patient arrived at [facility] in stable condition on 6/27.  He had a HIDA scan which was negative for biliary leak.  Evaluated by GI. Started on clear liquid that he tolerated.  Advance diet to soft that he continued to tolerate without any GI symptoms.  GI did not feel ERCP is necessary and cleared him for discharge.  Patient was evaluated by PT/OT prior to discharge.  Home health PT/OT was recommended and ordered.  See individual problem list below for more.  Discharge Diagnoses:  Intra-abdominal fluid collection: possible abscess or bilateral Alekh Recent chole-colo-fistula status post cholecystectomy and partial colectomy by Dr. [name] on 04/23/2019 -Returns with generalized weakness, nausea.   -Septic with elevated liver enzymes on admission. -CT abdomen and pelvis 6/23, repeat CT abdomen and pelvis on 6/26 and MRCP on 6/26 revealed:             -3.5 x 4.3 cm gallbladder fossa complex fluid, intrahepatic biliary, possible biliary leak             -2.8 cm complex fluid collection anterior mesenteric aspect of proximal ascending colon -Fluid collection could be abscesses or bilateral leak -Fluid collection not drainable per discussion between previous attending and IR. -HIDA scan on 6/27 did not reveal biliary leak -Blood cultures from 6/23 coag negative staph in 1 out of 2 bottles likely contaminant -No objective fever throughout his stay.   -Leukocytosis resolved. -IV Zosyn 6/23-6/28, Augmentin 6/28-7/2 -No need for ERCP per GI.  Signed off.  Elevated liver enzymes: Resolving. -Recheck CMP at follow-up.  Fairly controlled NIDDM-2 with mild hyperglycemia and renal complication -Discharged on home medications and statin.  CKD-3: creatinine trended likely.  Suspect  this to be due to dehydration from n.p.o. status during transfer -Home lisinopril, Lasix and Aldactone held on discharge. -Recheck renal function and  resume home medications as appropriate.  Hypertension: Normotensive -Continue current regimen  History of GI bleed: H&H stable.  Baseline 8-9.  Anemia panel consistent with iron deficiency anemia. -Received Feraheme x1 -Recheck CBC at follow-up  Mild pulmonary hypertension: Echocardiogram in 2017 with EF of 65 to 70%, G1 DD and PASP of 35 mmHg. -No cardiopulmonary symptoms off Lasix here -Home Lasix and Aldactone held on discharge due to uptrending creatinine -May consider resuming at follow-up based on renal function.  Constipation: Resolved with bowel regimen. -Discharged on MiraLAX.  Discharge Instructions  Discharge Instructions    (HEART FAILURE PATIENTS) Call MD:  Anytime you have any of the following symptoms: 1) 3 pound weight gain in 24 hours or 5 pounds in 1 week 2) shortness of breath, with or without a dry hacking cough 3) swelling in the hands, feet or stomach 4) if you have to sleep on extra pillows at night in order to breathe.   Complete by: As directed    Call MD for:  extreme fatigue   Complete by: As directed    Call MD for:  persistant dizziness or light-headedness   Complete by: As directed    Call MD for:  persistant nausea and vomiting   Complete by: As directed    Call MD for:  severe uncontrolled pain   Complete by: As directed    Call MD for:  temperature >100.4   Complete by: As directed    Diet - low sodium heart healthy   Complete by: As directed    Diet Carb Modified   Complete by: As directed    Discharge instructions   Complete by: As directed    It has been a pleasure taking care of you! You were admitted with abdominal pain and nausea.  There was a concern about small abscesses or infection in your belly.  This was treated with antibiotics and improved.  We are discharging you on more antibiotics you need to continue taking after you leave the hospital.  We have also made some changes to your home medication.  Please review medication list  and the directions before you take them.  Please call your primary care doctor office as soon as possible to schedule schedule office visit  for hospital follow-up either this week or next week.  Take care, Dr. Cyndia Skeeters   Increase activity slowly   Complete by: As directed      Allergies as of 05/21/2019   No Known Allergies     Medication List    STOP taking these medications   furosemide 20 MG tablet Commonly known as: LASIX   lisinopril 40 MG tablet Commonly known as: ZESTRIL   spironolactone 25 MG tablet Commonly known as: ALDACTONE     TAKE these medications   acetaminophen 325 MG tablet Commonly known as: TYLENOL Take 2 tablets (650 mg total) by mouth every 6 (six) hours as needed for mild pain, fever or headache (or Fever >/= 101).   amoxicillin-clavulanate 875-125 MG tablet Commonly known as: AUGMENTIN Take 1 tablet by mouth every 12 (twelve) hours.   aspirin EC 81 MG tablet Take 81 mg by mouth daily.   atorvastatin 40 MG tablet Commonly known as: LIPITOR Take 40 mg by mouth daily.   ENSURE ENLIVE PO Take 1 Bottle by mouth daily.   Ensure Clear Liqd Take 1  Bottle by mouth 2 (two) times daily between meals.   feeding supplement (PRO-STAT SUGAR FREE 64) Liqd Take 30 mLs by mouth 2 (two) times daily between meals.   hydrALAZINE 100 MG tablet Commonly known as: APRESOLINE Take 0.5 tablets (50 mg total) by mouth 3 (three) times daily.   isosorbide mononitrate 60 MG 24 hr tablet Commonly known as: IMDUR Take 1 tablet (60 mg total) by mouth daily. Start taking on: May 22, 2019   metFORMIN 500 MG tablet Commonly known as: GLUCOPHAGE Take 500 mg by mouth 2 (two) times a day.   NON FORMULARY Diet Type:  NAS, Consistent Carbohydrate, bland diet   ondansetron 4 MG disintegrating tablet Commonly known as: ZOFRAN-ODT Take 1 tablet (4 mg total) by mouth every 6 (six) hours as needed for nausea.   pantoprazole 40 MG tablet Commonly known as:  PROTONIX Take 1 tablet (40 mg total) by mouth daily.   polyethylene glycol powder 17 GM/SCOOP powder Commonly known as: GLYCOLAX/MIRALAX Mix 17 gm or a scoop in 8 oz of water and drink one to two times a day   verapamil 120 MG CR tablet Commonly known as: CALAN-SR Take 1 tablet (120 mg total) by mouth daily.      Follow-up Information    [name], MD. Schedule an appointment as soon as possible for a visit in 1 week(s).   Specialty: Internal Medicine Contact information: [place] [address] [phone]        Arnoldo Lenis, MD .   Specialty: Cardiology Contact information: [address] [place] [address] [phone]           Consultations:  Gastroenterology  IR  General surgery  Procedures/Studies:  2D Echo: None  Dg Knee 1-2 Views Left  Result Date: 04/29/2019 CLINICAL DATA:  Left knee pain and swelling. Fluid aspirated from knee today. Daughter. EXAM: LEFT KNEE - 1-2 VIEW COMPARISON:  None. FINDINGS: Mild to moderate tricompartmental osteoarthritic changes present. There is evidence of a small joint effusion. No evidence of fracture or dislocation. IMPRESSION: No acute findings. Small joint effusion.  Mild to moderate osteoarthritis. Electronically Signed   By: Marin Olp M.D.   On: 04/29/2019 18:17   Dg Abd 1 View  Result Date: 05/07/2019 CLINICAL DATA:  Abdominal distension, pain, nausea and vomiting. Patient status post cholecystectomy 04/23/2019. EXAM: ABDOMEN - 1 VIEW COMPARISON:  None. FINDINGS: The bowel gas pattern is nonobstructive. There is mild gaseous distention of the stomach and colon. Cholecystectomy clips noted. No unexpected abdominal calcification or acute bony abnormality. No radio-opaque calculi or other significant radiographic abnormality are seen. IMPRESSION: Nonobstructive bowel gas pattern. Mild distention of the stomach and colon may be due to ileus. Electronically Signed   By: Inge Rise M.D.   On: 05/07/2019 15:49   Ct Abdomen Pelvis W Contrast  Result Date: 05/18/2019 CLINICAL DATA:  Abdominal pain, status post cholecystectomy and partial colectomy. EXAM: CT ABDOMEN AND PELVIS WITH CONTRAST TECHNIQUE: Multidetector CT imaging of the abdomen and pelvis was performed using the standard protocol following bolus administration of intravenous contrast. CONTRAST:  15mL OMNIPAQUE IOHEXOL 300 MG/ML SOLN, 159mL OMNIPAQUE IOHEXOL 300 MG/ML SOLN COMPARISON:  05/15/2019, 04/19/2019 FINDINGS: Lower chest: Small right pleural effusion with basilar atelectasis. Trace left pleural effusion. Hepatobiliary: No focal mass. Interval development of air within the intrahepatic biliary collecting system 3.5 x 4.2 cm with mild dilatation of the central biliary collecting system may reflect developing cholangitis in the absence  of recent instrumentation. Complex fluid collection in the gallbladder fossa with the overall size unchanged, but increase in the amount of fluid within the collection most consistent with an abscess. Mild surrounding inflammatory changes. Pancreas: Unremarkable. No pancreatic ductal dilatation or surrounding inflammatory changes. Spleen: Normal in size without focal abnormality. Adrenals/Urinary Tract: Normal adrenal glands. Stable 2 cm left renal cyst. Kidneys otherwise normal. No urolithiasis or obstructive uropathy. Normal bladder. Stomach/Bowel: No bowel dilatation to suggest bowel obstruction. Gastric air-fluid level with wall thickening of the antrum and proximal duodenum which may reflect an element of gastric outlet obstruction secondary to adjacent inflammatory changes. Partial ascending colectomy a with adjacent post surgical changes. Large amount of stool throughout the colon. Normal appendix. Stable 22 x 15 mm complex fluid collection along the antimesenteric aspect of the proximal ascending colon along the anterior abdominal wall most consistent with a small abscess.  Vascular/Lymphatic: Normal caliber abdominal aorta with mild atherosclerosis. No lymphadenopathy. Reproductive: Prostate is unremarkable. Other: No abdominal wall hernia or abnormality. No abdominopelvic ascites. Postsurgical changes in the right anterior abdominal wall. 18 x 15 mm complex fluid in the anterior abdominal wall subcutaneous fat along the surgical site likely reflecting a small hematoma. Musculoskeletal: No acute osseous abnormality. No aggressive osseous lesion. Degenerative disease with disc height loss and facet arthropathy at L2-3, L3-4, L4-5 and L5-S1. Bilateral foraminal stenosis at L4-5 and L5-S1. IMPRESSION: 1. Interval development of air within the intrahepatic biliary collecting system 3.5 x 4.2 cm with mild dilatation of the central biliary collecting system may reflect developing cholangitis in the absence of recent instrumentation. 2. 3.5 x 4.2 cm complex fluid collection in the gallbladder fossa with the overall size unchanged, but there is increase in the amount of fluid within the collection most consistent with an abscess. Mild surrounding inflammatory changes. 3. Stable 22 x 15 mm complex fluid collection along the antimesenteric aspect of the proximal ascending colon along the anterior abdominal wall most consistent with a small abscess. 4. Gastric air-fluid level with wall thickening of the antrum and proximal duodenum which may reflect an element of gastric outlet obstruction secondary to adjacent inflammatory changes. 5.  Aortic Atherosclerosis (ICD10-I70.0). 6. Large amount of stool throughout the colon. 7. Postsurgical changes the site partial ascending colectomy. Electronically Signed   By: Kathreen Devoid   On: 05/18/2019 11:47   Ct Abdomen Pelvis W Contrast  Result Date: 05/15/2019 CLINICAL DATA:  Diarrhea, no pain EXAM: CT ABDOMEN AND PELVIS WITH CONTRAST TECHNIQUE: Multidetector CT imaging of the abdomen and pelvis was performed using the standard protocol following bolus  administration of intravenous contrast. CONTRAST:  133mL OMNIPAQUE IOHEXOL 300 MG/ML  SOLN COMPARISON:  04/19/2019 FINDINGS: Lower chest: Small right pleural effusion. Trace left pleural effusion. Right basilar atelectasis. Right gynecomastia again noted. Hepatobiliary: No focal hepatic mass. No intrahepatic fluid collection. No interval change in minimal intrahepatic biliary duct dilatation centrally. Interval cholecystectomy. 3.5 x 4.6 cm complex fluid collection in the gallbladder fossa with an air-fluid level concerning for an abscess. Pancreas: Unremarkable. No pancreatic ductal dilatation or surrounding inflammatory changes. Spleen: Normal in size without focal abnormality. Adrenals/Urinary Tract: Normal adrenal glands. Stable 2 cm left renal cyst. Kidneys otherwise normal. No urolithiasis or obstructive uropathy. Normal bladder. Stomach/Bowel: No bowel dilatation to suggest bowel obstruction. Gastric air-fluid level with wall thickening of the antrum and proximal duodenum which may reflect an element of gastric outlet obstruction. Bowel wall thickening and surrounding inflammatory changes around the proximal ascending colon concerning for mild colitis.  Normal appendix. 2.8 cm fluid collection along the anti mesenteric aspect of the proximal ascending colon along the anterior abdominal wall most consistent with an abscess. Vascular/Lymphatic: Normal caliber abdominal aorta with mild atherosclerosis. No lymphadenopathy. Reproductive: Prostate is unremarkable. Other: No abdominal wall hernia or abnormality. No abdominopelvic ascites. Postsurgical changes in the subcutaneous fat of the right anterior abdominal wall. Musculoskeletal: No acute osseous abnormality. No aggressive osseous lesion. Degenerative disease with disc height loss at L2-3, L3-4 L4-5, L5-S1. IMPRESSION: 1. Interval cholecystectomy. 3.5 x 4.6 cm complex fluid collection in the gallbladder fossa with an air-fluid level concerning for an abscess.  No CT evidence of cholangitis. 2. Gastric air-fluid level with reactive wall thickening of the antrum and proximal duodenum which may reflect an element of gastric outlet obstruction. Bowel wall thickening and surrounding inflammatory changes around the proximal ascending colon concerning for mild colitis. 2.8 cm fluid collection along the anti mesenteric aspect of the proximal ascending colon along the anterior abdominal wall most consistent with an abscess. 3. Small bilateral pleural effusions. Electronically Signed   By: Kathreen Devoid   On: 05/15/2019 12:16   US Renal  Result Date: 04/28/2019 CLINICAL DATA:  Acute renal insufficiency. Patient currently admitted. Diabetes. EXAM: RENAL / URINARY TRACT ULTRASOUND COMPLETE COMPARISON:  CT 04/19/2019 FINDINGS: Right Kidney: Renal measurements: 10.8 x 5.5 x 6.8 cm = volume: 210 mL . Echogenicity within normal limits. No mass or hydronephrosis visualized. 2.3 cm upper pole cyst. Left Kidney: Renal measurements: 9.0 x 6.0 x 5.1 cm = volume: 144 mL. Echogenicity within normal limits. No mass or hydronephrosis visualized. 2.6 cm midpole cyst and 2.5 cm upper pole cyst. Bladder: Appears normal for degree of bladder distention. IMPRESSION: Normal size kidneys without hydronephrosis.  Bilateral renal cysts. Electronically Signed   By: Marin Olp M.D.   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 Now with rim enhancing fluid collection in the gallbladder fossa on recent CT. EXAM: MRI ABDOMEN WITHOUT AND WITH CONTRAST (INCLUDING MRCP) TECHNIQUE: Multiplanar multisequence MR imaging of the abdomen was performed both before and after the administration of intravenous contrast. Heavily T2-weighted images of the biliary and pancreatic ducts were obtained, and three-dimensional MRCP images were rendered by post processing.  CONTRAST:  10 cc Gadavist COMPARISON:  CT scan earlier today. FINDINGS: Exam is markedly degraded by motion secondary to patient inability to reproducibly breath hold. Lower chest: Small right and tiny left pleural effusion. Dependent atelectasis noted right lower lobe. Hepatobiliary: No gross focal enhancing lesion within the liver. Mild intrahepatic biliary duct dilatation is associated with extrahepatic common duct measuring up to 6 mm diameter. Areas of signal void within the hepatic ductal anatomy compatible with gas as seen on CT earlier today. 3.5 by 3.6 cm fluid collection with gas identified in the gallbladder fossa. Pancreas: Nondiagnostic imaging due to motion artifact. No gross main pancreatic ductal dilatation. Spleen:  No gross abnormality within the spleen. Adrenals/Urinary Tract: No adrenal nodule or mass. 2.7 cm cystic lesion in the upper pole right kidney with some probable septation along the internal wall. This is contiguous with and possibly insinuating around a upper pole collecting system, but enhancing mural nodule not completely excluded. 2.7 cm simple cyst noted lower pole left kidney. Renal assessment limited by the substantial motion artifact and tiny renal lesions identified on today's study cannot be definitively characterized.  Stomach/Bowel: Moderate gastric distention. Fine detail of bowel loops in the abdomen obscured by motion artifact, but no gross small bowel or colonic dilatation within the visualized abdomen. Vascular/Lymphatic: No evidence for abdominal aortic aneurysm. No abdominal lymphadenopathy evident. Other:  No intraperitoneal free fluid. Musculoskeletal: No abnormal marrow enhancement within the visualized bony anatomy. IMPRESSION: 1. 3.5 x 3.6 cm rim enhancing fluid collection identified in the gallbladder fossa. Imaging features remain compatible with abscess. 2. Mild prominence of intrahepatic bile ducts without substantial extrahepatic biliary duct dilatation. Areas  of signal void within the hepatic ducts compatible with pneumobilia. 3. 2.7 cm cystic lesion upper pole right kidney with some internal septation along the inferior margin. There may be an enhancing mural nodule or this cyst may insinuate around a renal calyx. Repeat MRI in 3-6 months recommended to ensure stability a cystic renal cell neoplasm not excluded. 4. Small bilateral pleural effusions, right greater than left. Electronically Signed   By: Misty Stanley M.D.   On: 05/18/2019 15:33   Dg Chest Port 1 View  Result Date: 04/30/2019 CLINICAL DATA:  Rectal bleeding.  Open cholecystectomy April 23, 2019. EXAM: PORTABLE CHEST 1 VIEW COMPARISON:  April 27, 2019 FINDINGS: Low lung volumes limit evaluation. Opacity remains in the right base, likely a small effusion with atelectasis. Infiltrate not excluded. No change in the cardiomediastinal silhouette. IMPRESSION: Mild opacity in the right base may represent a small effusion with atelectasis versus infiltrate. Recommend follow-up to resolution. Electronically Signed   By: Dorise Bullion III M.D   On: 04/30/2019 14:22   Dg Chest Port 1 View  Result Date: 04/27/2019 CLINICAL DATA:  Fever. EXAM: PORTABLE CHEST 1 VIEW COMPARISON:  10/21/2016. FINDINGS: Again demonstrated is a very poor inspiration. Mild-to-moderate bibasilar atelectasis. Mild peribronchial thickening. Stable borderline enlarged cardiac silhouette. Unremarkable bones. IMPRESSION: 1. Poor inspiration with mild to moderate bibasilar atelectasis. 2. Mild bronchitic changes. Electronically Signed   By: Claudie Revering M.D.   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 Now with rim enhancing fluid collection in the gallbladder fossa on recent CT. EXAM: MRI ABDOMEN WITHOUT AND WITH CONTRAST (INCLUDING MRCP) TECHNIQUE: Multiplanar multisequence MR imaging of  the abdomen was performed both before and after the administration of intravenous contrast. Heavily T2-weighted images of the biliary and pancreatic ducts were obtained, and three-dimensional MRCP images were rendered by post processing. CONTRAST:  10 cc Gadavist COMPARISON:  CT scan earlier today. FINDINGS: Exam is markedly degraded by motion secondary to patient inability to reproducibly breath hold. Lower chest: Small right and tiny left pleural effusion. Dependent atelectasis noted right lower lobe. Hepatobiliary: No gross focal enhancing lesion within the liver. Mild intrahepatic biliary duct dilatation is associated with extrahepatic common duct measuring up to 6 mm diameter. Areas of signal void within the hepatic ductal anatomy compatible with gas as seen on CT earlier today. 3.5 by 3.6 cm fluid collection with gas identified in the gallbladder fossa. Pancreas: Nondiagnostic imaging due to motion artifact. No gross main pancreatic ductal dilatation. Spleen:  No gross abnormality within the spleen. Adrenals/Urinary Tract: No adrenal nodule or mass. 2.7 cm cystic lesion in the upper pole right kidney with some probable septation along the internal wall. This is contiguous with and possibly insinuating around a upper pole collecting system, but enhancing mural nodule not completely excluded. 2.7 cm simple cyst noted  lower pole left kidney. Renal assessment limited by the substantial motion artifact and tiny renal lesions identified on today's study cannot be definitively characterized. Stomach/Bowel: Moderate gastric distention. Fine detail of bowel loops in the abdomen obscured by motion artifact, but no gross small bowel or colonic dilatation within the visualized abdomen. Vascular/Lymphatic: No evidence for abdominal aortic aneurysm. No abdominal lymphadenopathy evident. Other:  No intraperitoneal free fluid. Musculoskeletal: No abnormal marrow enhancement within the visualized bony anatomy. IMPRESSION: 1.  3.5 x 3.6 cm rim enhancing fluid collection identified in the gallbladder fossa. Imaging features remain compatible with abscess. 2. Mild prominence of intrahepatic bile ducts without substantial extrahepatic biliary duct dilatation. Areas of signal void within the hepatic ducts compatible with pneumobilia. 3. 2.7 cm cystic lesion upper pole right kidney with some internal septation along the inferior margin. There may be an enhancing mural nodule or this cyst may insinuate around a renal calyx. Repeat MRI in 3-6 months recommended to ensure stability a cystic renal cell neoplasm not excluded. 4. Small bilateral pleural effusions, right greater than left. Electronically Signed   By: Misty Stanley M.D.   On: 05/18/2019 15:33   Nm Hepato Biliary Leak  Result Date: 05/19/2019 CLINICAL DATA:  78 year old male with recent cholecystectomy. Concern for bile leak. EXAM: NUCLEAR MEDICINE HEPATOBILIARY IMAGING TECHNIQUE: Sequential images of the abdomen were obtained out to 120 minutes following intravenous administration of radiopharmaceutical. RADIOPHARMACEUTICALS:  5.2 mCi Tc-89m  Choletec IV COMPARISON:  CT of the abdomen pelvis dated 05/18/2019 and MRI dated 05/18/2019 FINDINGS: Prompt uptake and biliary excretion of activity by the liver is seen. There is dilatation of the common bile duct status post cholecystectomy. No extra biliary activity identified to suggest bile leak. Biliary activity passes into small bowel, consistent with patent common bile duct. IMPRESSION: No scintigraphic evidence of bile leak. Electronically Signed   By: Anner Crete M.D.   On: 05/19/2019 20:46   US Abdomen Limited Ruq  Result Date: 05/15/2019 CLINICAL DATA:  Recent cholecystectomy.  Elevated liver enzymes EXAM: ULTRASOUND ABDOMEN LIMITED RIGHT UPPER QUADRANT COMPARISON:  CT abdomen and pelvis Apr 19, 2019 FINDINGS: Gallbladder: Surgically absent. No lesions seen in the gallbladder fossa by ultrasound. Common bile duct:  Diameter: 10 mm which is upper normal in size for post cholecystectomy state. No appreciable intrahepatic biliary duct dilatation evident. Note that portions of the common bile duct are obscured by gas. Liver: Echogenic foci which shadow are noted in the liver, likely with in intrahepatic ducts. Appearance suggests air in these areas. No liver mass lesions are evident. Within normal limits in parenchymal echogenicity. Portal vein is patent on color Doppler imaging with normal direction of blood flow towards the liver. There is a small right pleural effusion. IMPRESSION: 1. Suspect foci of air within intrahepatic biliary ducts. This finding raises concern for potential degree of cholangitis. The common bile duct is upper normal in size for post cholecystectomy state. Note that portions of the common bile duct are not seen. Given these concerns, CT or MR, ideally with intravenous contrast, advised to further assess. 2. Gallbladder absent. No lesions seen by ultrasound in the gallbladder fossa region. 3.  Small right pleural effusion. These results will be called to the ordering clinician or representative by the Radiologist Assistant, and communication documented in the PACS or zVision Dashboard. Electronically Signed   By: Lowella Grip III M.D.   On: 05/15/2019 09:15      Subjective: No major events overnight of this morning.  No complaint  this morning.  Tolerating diet.  Denies chest pain, dyspnea, nausea, vomiting, diarrhea or abdominal pain.  No GU symptoms.  Feels ready to go home.   Discharge Exam: Vitals:   05/21/19 0254 05/21/19 0825  BP: 132/70 (!) 152/81  Pulse: 96 86  Resp:  20  Temp: 98.9 F (37.2 C) 98.1 F (36.7 C)  SpO2: 100% 99%    GENERAL: No acute distress.  Appears well.  HEENT: MMM.  Vision and hearing grossly intact.  NECK: Supple.  No JVD.  LUNGS:  No IWOB. Good air movement bilaterally. HEART:  RRR. Heart sounds normal.  ABD: Bowel sounds present. Soft. Non tender.   MSK/EXT:  Moves all extremities. No apparent deformity. No edema bilaterally. SKIN: no apparent skin lesion or wound NEURO: Awake, alert and oriented appropriately.  No gross deficit.  PSYCH: Calm. Normal affect.     The results of significant diagnostics from this hospitalization (including imaging, microbiology, ancillary and laboratory) are listed below for reference.     Microbiology: Recent Results (from the past 240 hour(s))  SARS Coronavirus 2 (CEPHEID - Performed in [facility] hospital lab), Hosp Order     Status: None   Collection Time: 05/15/19 12:28 PM   Specimen: Nasopharyngeal Swab  Result Value Ref Range Status   SARS Coronavirus 2 NEGATIVE NEGATIVE Final    Comment: (NOTE) If result is NEGATIVE SARS-CoV-2 target nucleic acids are NOT DETECTED. The SARS-CoV-2 RNA is generally detectable in upper and lower  respiratory specimens during the acute phase of infection. The lowest  concentration of SARS-CoV-2 viral copies this assay can detect is 250  copies / mL. A negative result does not preclude SARS-CoV-2 infection  and should not be used as the sole basis for treatment or other  patient management decisions.  A negative result may occur with  improper specimen collection / handling, submission of specimen other  than nasopharyngeal swab, presence of viral mutation(s) within the  areas targeted by this assay, and inadequate number of viral copies  (<250 copies / mL). A negative result must be combined with clinical  observations, patient history, and epidemiological information. If result is POSITIVE SARS-CoV-2 target nucleic acids are DETECTED. The SARS-CoV-2 RNA is generally detectable in upper and lower  respiratory specimens dur ing the acute phase of infection.  Positive  results are indicative of active infection with SARS-CoV-2.  Clinical  correlation with patient history and other diagnostic information is  necessary to determine patient infection status.   Positive results do  not rule out bacterial infection or co-infection with other viruses. If result is PRESUMPTIVE POSTIVE SARS-CoV-2 nucleic acids MAY BE PRESENT.   A presumptive positive result was obtained on the submitted specimen  and confirmed on repeat testing.  While 2019 novel coronavirus  (SARS-CoV-2) nucleic acids may be present in the submitted sample  additional confirmatory testing may be necessary for epidemiological  and / or clinical management purposes  to differentiate between  SARS-CoV-2 and other Sarbecovirus currently known to infect humans.  If clinically indicated additional testing with an alternate test  methodology [phone]) is advised. The SARS-CoV-2 RNA is generally  detectable in upper and lower respiratory sp ecimens during the acute  phase of infection. The expected result is Negative. Fact Sheet for Patients:  StrictlyIdeas.no Fact Sheet for Healthcare Providers: BankingDealers.co.za This test is not yet approved or cleared by the [place] FDA and has been authorized for detection and/or diagnosis of SARS-CoV-2 by FDA under an Emergency Use Authorization (  EUA).  This EUA will remain in effect (meaning this test can be used) for the duration of the COVID-19 declaration under Section 564(b)(1) of the Act, 21 U.S.C. section 360bbb-3(b)(1), unless the authorization is terminated or revoked sooner. Performed at [facility], [address] [place], [place] [address]   Blood culture (routine x 2)     Status: None   Collection Time: 05/15/19 12:59 PM   Specimen: BLOOD RIGHT HAND  Result Value Ref Range Status   Specimen Description BLOOD RIGHT HAND  Final   Special Requests   Final    BOTTLES DRAWN AEROBIC AND ANAEROBIC Blood Culture results may not be optimal due to an inadequate volume of blood received in culture bottles   Culture   Final    NO GROWTH 5 DAYS Performed at [facility], [address], [place], [place] [address]    Report Status 05/20/2019 FINAL  Final  Blood culture (routine x 2)     Status: Abnormal   Collection Time: 05/15/19  1:05 PM   Specimen: BLOOD RIGHT ARM  Result Value Ref Range Status   Specimen Description   Final    BLOOD RIGHT ARM Performed at [facility], [address], [place], [place] [address]    Special Requests   Final    BOTTLES DRAWN AEROBIC AND ANAEROBIC Blood Culture results may not be optimal due to an inadequate volume of blood received in culture bottles Performed at [facility], [address] [place], [place] [address]    Culture  Setup Time   Final    GRAM POSITIVE COCCI Gram Stain Report Called to,Read Back By and Verified With: BENSON,D. AT 1358 ON 05/16/2019 BY EVA ANAEROBIC BOTTLE ONLY Performed at [facility] BY AND VERIFIED WITH: D BENSON RN 05/16/19 1756 JDW    Culture (A)  Final    STAPHYLOCOCCUS SPECIES (COAGULASE NEGATIVE) THE SIGNIFICANCE OF ISOLATING THIS ORGANISM FROM A SINGLE SET OF BLOOD CULTURES WHEN MULTIPLE SETS ARE DRAWN IS UNCERTAIN. PLEASE NOTIFY THE MICROBIOLOGY DEPARTMENT WITHIN ONE WEEK IF SPECIATION AND SENSITIVITIES ARE REQUIRED. Performed at [facility], [place] [address], [place], [place] [address]    Report Status 05/18/2019 FINAL  Final  Blood Culture ID Panel (Reflexed)     Status: Abnormal   Collection Time: 05/15/19  1:05 PM  Result Value Ref Range Status   Enterococcus species NOT DETECTED NOT DETECTED Final   Listeria monocytogenes NOT DETECTED NOT DETECTED Final   Staphylococcus species DETECTED (A) NOT DETECTED Final    Comment: Methicillin (oxacillin) resistant coagulase negative staphylococcus. Possible blood culture contaminant (unless isolated from more than one blood culture draw or clinical case suggests pathogenicity). No antibiotic treatment is indicated for blood  culture contaminants. CRITICAL RESULT CALLED TO, READ BACK BY AND VERIFIED  WITH: D BENSON RN 05/16/19 1756 JDW    Staphylococcus aureus (BCID) NOT DETECTED NOT DETECTED Final   Methicillin resistance DETECTED (A) NOT DETECTED Final    Comment: CRITICAL RESULT CALLED TO, READ BACK BY AND VERIFIED WITH: D BENSON RN 05/16/19 1756 JDW    Streptococcus species NOT DETECTED NOT DETECTED Final   Streptococcus agalactiae NOT DETECTED NOT DETECTED Final   Streptococcus pneumoniae NOT DETECTED NOT DETECTED Final   Streptococcus pyogenes NOT DETECTED NOT DETECTED Final   Acinetobacter baumannii NOT DETECTED NOT DETECTED Final   Enterobacteriaceae species NOT DETECTED NOT DETECTED Final   Enterobacter cloacae complex NOT DETECTED NOT DETECTED Final  Escherichia coli NOT DETECTED NOT DETECTED Final   Klebsiella oxytoca NOT DETECTED NOT DETECTED Final   Klebsiella pneumoniae NOT DETECTED NOT DETECTED Final   Proteus species NOT DETECTED NOT DETECTED Final   Serratia marcescens NOT DETECTED NOT DETECTED Final   Haemophilus influenzae NOT DETECTED NOT DETECTED Final   Neisseria meningitidis NOT DETECTED NOT DETECTED Final   Pseudomonas aeruginosa NOT DETECTED NOT DETECTED Final   Candida albicans NOT DETECTED NOT DETECTED Final   Candida glabrata NOT DETECTED NOT DETECTED Final   Candida krusei NOT DETECTED NOT DETECTED Final   Candida parapsilosis NOT DETECTED NOT DETECTED Final   Candida tropicalis NOT DETECTED NOT DETECTED Final    Comment: Performed at [facility], [place] [address], [place], [place] [address]  MRSA PCR Screening     Status: None   Collection Time: 05/15/19  7:42 PM   Specimen: Nasal Mucosa; Nasopharyngeal  Result Value Ref Range Status   MRSA by PCR NEGATIVE NEGATIVE Final    Comment:        The GeneXpert MRSA Assay (FDA approved for NASAL specimens only), is one component of a comprehensive MRSA colonization surveillance program. It is not intended to diagnose MRSA infection nor to guide or monitor treatment for MRSA  infections. Performed at [facility], [address] [place],  [address]      Labs: BNP (last 3 results) No results for input(s): BNP in the last 8760 hours. Basic Metabolic Panel: Recent Labs  Lab 05/17/19 0525 05/18/19 0820 05/19/19 0820 05/20/19 0638 05/21/19 0530  NA 139 139 139 139 139  K 4.3 4.2 4.1 4.0 4.0  CL 101 104 101 102 104  CO2 25 25 26 25 26   GLUCOSE 179* 201* 181* 165* 196*  BUN 21 15 11 9 16   CREATININE 1.44* 1.25* 1.23 1.33* 1.62*  CALCIUM 8.2* 8.2* 8.7* 8.5* 8.2*  MG  --   --   --  1.7 1.8   Liver Function Tests: Recent Labs  Lab 05/17/19 0525 05/18/19 0820 05/19/19 0820 05/20/19 0638 05/21/19 0530  AST 57* 48* 36 29 23  ALT 102* 92* 75* 57* 46*  ALKPHOS 94 104 100 85 81  BILITOT 0.4 0.2* 0.5 0.4 0.2*  PROT 6.5 7.1 6.8 6.4* 6.2*  ALBUMIN 2.5* 2.7* 2.7* 2.3* 2.3*   No results for input(s): LIPASE, AMYLASE in the last 168 hours. No results for input(s): AMMONIA in the last 168 hours. CBC: Recent Labs  Lab 05/15/19 1011  05/17/19 0525 05/18/19 0820 05/19/19 0820 05/20/19 0638 05/21/19 0530  WBC 17.6*   < > 15.0* 12.6* 12.8* 10.5 11.3*  NEUTROABS 11.9*  --   --   --   --   --   --   HGB 9.4*   < > 8.6* 8.6* 8.9* 8.3* 7.9*  HCT 30.3*   < > 28.5* 28.1* 28.8* 26.5* 25.8*  MCV 92.4   < > 93.1 91.8 91.1 90.1 91.5  PLT 797*   < > 568* 495* 464* 430* 403*   < > = values in this interval not displayed.   Cardiac Enzymes: No results for input(s): CKTOTAL, CKMB, CKMBINDEX, TROPONINI in the last 168 hours. BNP: Invalid input(s): POCBNP CBG: Recent Labs  Lab 05/20/19 1602 05/20/19 2010 05/20/19 2340 05/21/19 0252 05/21/19 0823  GLUCAP 231* 218* 191* 177* 190*   D-Dimer No results for input(s): DDIMER in the last 72 hours. Hgb A1c Recent Labs    05/20/19 1528  HGBA1C 7.0*  Lipid Profile No results for input(s): CHOL, HDL, LDLCALC, TRIG, CHOLHDL, LDLDIRECT in the last 72 hours. Thyroid function studies No results for  input(s): TSH, T4TOTAL, T3FREE, THYROIDAB in the last 72 hours.  Invalid input(s): FREET3 Anemia work up Recent Labs    05/20/19 1528  VITAMINB12 437  FOLATE 11.4  FERRITIN 121  TIBC 267  IRON 33*  RETICCTPCT 2.7   Urinalysis    Component Value Date/Time   COLORURINE YELLOW 04/29/2019 1330   APPEARANCEUR CLOUDY (A) 04/29/2019 1330   LABSPEC 1.019 04/29/2019 1330   PHURINE 5.0 04/29/2019 1330   GLUCOSEU NEGATIVE 04/29/2019 1330   HGBUR NEGATIVE 04/29/2019 1330   BILIRUBINUR NEGATIVE 04/29/2019 1330   KETONESUR NEGATIVE 04/29/2019 1330   PROTEINUR 30 (A) 04/29/2019 1330   UROBILINOGEN 0.2 01/04/2012 1646   NITRITE NEGATIVE 04/29/2019 1330   LEUKOCYTESUR NEGATIVE 04/29/2019 1330   Sepsis Labs Invalid input(s): PROCALCITONIN,  WBC,  LACTICIDVEN   Time coordinating discharge: 35 minutes  SIGNED:  Mercy Riding, MD  [facility] 05/21/2019, 10:53 AM  If 7PM-7AM, please contact night-coverage [email or web address] Password [email or web address]

## 2019-05-22 LAB — NOVEL CORONAVIRUS, NAA (HOSP ORDER, SEND-OUT TO REF LAB; TAT 18-24 HRS): SARS-CoV-2, NAA: NOT DETECTED

## 2019-05-23 ENCOUNTER — Other Ambulatory Visit: Payer: Self-pay | Admitting: *Deleted

## 2019-05-23 NOTE — Patient Outreach (Addendum)
[place] [facility]) Care Management  05/23/2019  Stephen RHOADS Sr. 05-28-1941 [id number]   Subjective: Telephone call to patient's home number, no answer, message states voicemail not set up, and unable to leave a message.    Objective: Per KPN (Knowledge Performance Now, point of care tool) and chart review, patient hospitalized 05/15/2019 - 05/21/2019 for Intra-abdominal fluid collection: possible abscess, 04/18/2019 - 05/03/2019 for Rectal bleeding, Cholecystitis with cholelithiasis,cholecystocolonic fistula, status post  Open cholecystectomy, partial colectomy on 04/23/2019.   Patient also has a history of diabetes, hypertension, hyperlipidemia, chronic kidney disease stage 3, and DVT.       Assessment: Received [employer] Transition of care referral on 05/22/2019.  Transition of care follow up pending patient contact.        Plan: RNCM will send unsuccessful outreach letter, [facility] pamphlet, will call patient for 2nd telephone outreach attempt within 4 business days, transition of care follow up, and proceed with case closure, within 10 business days if no return call.     Reginae Wolfrey H. Annia Friendly, BSN, [place] Management [facility] Telephonic CM Phone: [phone] Fax: [phone]

## 2019-05-24 ENCOUNTER — Other Ambulatory Visit: Payer: Self-pay | Admitting: *Deleted

## 2019-05-24 NOTE — Patient Outreach (Signed)
[facility]) Care Management  05/24/2019  DELOSS AMICO Sr. May 14, 1941 [id number]   Subjective: Telephone call to patient's home number, no answer, message states voicemail not set up, and unable to leave a message. Telephone call to patient's mobile number, spoke with male answering the phone, states [name] Sr., does not live at this residence, and advised to call at home number [phone]).   States she spoke with Mr. Aden earlier today.     Objective: Per KPN (Knowledge Performance Now, point of care tool) and chart review, patient hospitalized 05/15/2019 - 05/21/2019 for Intra-abdominal fluid collection: possible abscess, 04/18/2019 - 05/03/2019 for Rectal bleeding, Cholecystitis with cholelithiasis,cholecystocolonicfistula, status post  Open cholecystectomy, partial colectomy on 04/23/2019.   Patient also has a history of diabetes, hypertension, hyperlipidemia, chronic kidney disease stage 3, and DVT.       Assessment: Received [employer] Transition of care referral on 05/22/2019.  Transition of care follow up pending patient contact.        Plan: RNCM has sent unsuccessful outreach letter, [facility] pamphlet, will call patient for 3rd telephone outreach attempt within 4 business days, transition of care follow up, and proceed with case closure, within 10 business days if no return call.    Jerrald Doverspike H. Annia Friendly, BSN, [place] Management [facility] Telephonic CM Phone: [phone] Fax: [phone]

## 2019-05-28 ENCOUNTER — Other Ambulatory Visit: Payer: Self-pay | Admitting: *Deleted

## 2019-05-28 NOTE — Patient Outreach (Signed)
[place] [facility]) Care Management  05/28/2019  LAMICHAEL YOUKHANA Sr. 1941/09/02 [id number]   Subjective:Telephone call to patient's home number, no answer,message states voicemail not set up, and unable to leave a message.    Objective:Per KPN (Knowledge Performance Now, point of care tool) and chart review,patient hospitalized 05/15/2019 - 05/21/2019 forIntra-abdominal fluid collection: possible abscess, 04/18/2019 - 05/03/2019 forRectal bleeding,Cholecystitis with cholelithiasis,cholecystocolonicfistula, status postOpen cholecystectomy, partial colectomyon 04/23/2019. Patient also has a history of diabetes, hypertension, hyperlipidemia, chronic kidney disease stage 3, and DVT.     Assessment: Received [employer] Transition of care referral on 05/22/2019.Transition of care follow up pending patient contact.    Plan:RNCM has sent unsuccessful outreach letter, [facility] pamphlet, will proceed with case closure, within 10 business days if no return call.    Sahily Biddle H. Annia Friendly, BSN, [place] Management [facility] Telephonic CM Phone: [phone] Fax: [phone]

## 2019-05-29 ENCOUNTER — Ambulatory Visit: Payer: Medicare HMO | Admitting: Family

## 2019-05-29 DIAGNOSIS — Z9189 Other specified personal risk factors, not elsewhere classified: Secondary | ICD-10-CM | POA: Diagnosis not present

## 2019-05-29 DIAGNOSIS — E1122 Type 2 diabetes mellitus with diabetic chronic kidney disease: Secondary | ICD-10-CM | POA: Diagnosis not present

## 2019-05-29 DIAGNOSIS — Z9049 Acquired absence of other specified parts of digestive tract: Secondary | ICD-10-CM | POA: Diagnosis not present

## 2019-05-29 DIAGNOSIS — I1310 Hypertensive heart and chronic kidney disease without heart failure, with stage 1 through stage 4 chronic kidney disease, or unspecified chronic kidney disease: Secondary | ICD-10-CM | POA: Diagnosis not present

## 2019-05-29 DIAGNOSIS — E669 Obesity, unspecified: Secondary | ICD-10-CM | POA: Diagnosis not present

## 2019-05-29 DIAGNOSIS — I10 Essential (primary) hypertension: Secondary | ICD-10-CM | POA: Diagnosis not present

## 2019-05-29 DIAGNOSIS — E785 Hyperlipidemia, unspecified: Secondary | ICD-10-CM | POA: Diagnosis not present

## 2019-05-29 DIAGNOSIS — N183 Chronic kidney disease, stage 3 (moderate): Secondary | ICD-10-CM | POA: Diagnosis not present

## 2019-05-29 DIAGNOSIS — M1712 Unilateral primary osteoarthritis, left knee: Secondary | ICD-10-CM | POA: Diagnosis not present

## 2019-05-29 DIAGNOSIS — E1165 Type 2 diabetes mellitus with hyperglycemia: Secondary | ICD-10-CM | POA: Diagnosis not present

## 2019-05-29 DIAGNOSIS — T8149XD Infection following a procedure, other surgical site, subsequent encounter: Secondary | ICD-10-CM | POA: Diagnosis not present

## 2019-05-29 DIAGNOSIS — I272 Pulmonary hypertension, unspecified: Secondary | ICD-10-CM | POA: Diagnosis not present

## 2019-05-29 DIAGNOSIS — E1169 Type 2 diabetes mellitus with other specified complication: Secondary | ICD-10-CM | POA: Diagnosis not present

## 2019-05-31 DIAGNOSIS — E1122 Type 2 diabetes mellitus with diabetic chronic kidney disease: Secondary | ICD-10-CM | POA: Diagnosis not present

## 2019-05-31 DIAGNOSIS — E1169 Type 2 diabetes mellitus with other specified complication: Secondary | ICD-10-CM | POA: Diagnosis not present

## 2019-05-31 DIAGNOSIS — T8149XD Infection following a procedure, other surgical site, subsequent encounter: Secondary | ICD-10-CM | POA: Diagnosis not present

## 2019-05-31 DIAGNOSIS — N183 Chronic kidney disease, stage 3 (moderate): Secondary | ICD-10-CM | POA: Diagnosis not present

## 2019-05-31 DIAGNOSIS — E785 Hyperlipidemia, unspecified: Secondary | ICD-10-CM | POA: Diagnosis not present

## 2019-05-31 DIAGNOSIS — I1310 Hypertensive heart and chronic kidney disease without heart failure, with stage 1 through stage 4 chronic kidney disease, or unspecified chronic kidney disease: Secondary | ICD-10-CM | POA: Diagnosis not present

## 2019-05-31 DIAGNOSIS — M1712 Unilateral primary osteoarthritis, left knee: Secondary | ICD-10-CM | POA: Diagnosis not present

## 2019-05-31 DIAGNOSIS — I272 Pulmonary hypertension, unspecified: Secondary | ICD-10-CM | POA: Diagnosis not present

## 2019-05-31 DIAGNOSIS — E669 Obesity, unspecified: Secondary | ICD-10-CM | POA: Diagnosis not present

## 2019-06-05 ENCOUNTER — Other Ambulatory Visit: Payer: Self-pay | Admitting: *Deleted

## 2019-06-05 NOTE — Patient Outreach (Addendum)
[facility]) Care Management  06/05/2019  Stephen KNOKE Sr. [birth year] [id number]   No response from patient outreach attempts will proceed with case closure.    Objective:Per KPN (Knowledge Performance Now, point of care tool) and chart review,patient hospitalized 05/15/2019 - 05/21/2019 forIntra-abdominal fluid collection: possible abscess, 04/18/2019 - 05/03/2019 forRectal bleeding,Cholecystitis with cholelithiasis,cholecystocolonicfistula, status postOpen cholecystectomy, partial colectomyon 04/23/2019. Patient also has a history of diabetes, hypertension, hyperlipidemia, chronic kidney disease stage 3, and DVT.     Assessment: Received [employer] Transition of care referral on 05/22/2019.Transition of care follow up not completed due to unable to contact patient and will proceed with case closure.  Unable to send MD case closure due to no primary MD on file.      Plan:Case closure due to unable to reach.      Stephen Rogers, BSN, [place] Management [facility] Telephonic CM Phone: [phone] Fax: [phone]

## 2019-06-06 DIAGNOSIS — E669 Obesity, unspecified: Secondary | ICD-10-CM | POA: Diagnosis not present

## 2019-06-06 DIAGNOSIS — I1310 Hypertensive heart and chronic kidney disease without heart failure, with stage 1 through stage 4 chronic kidney disease, or unspecified chronic kidney disease: Secondary | ICD-10-CM | POA: Diagnosis not present

## 2019-06-06 DIAGNOSIS — E785 Hyperlipidemia, unspecified: Secondary | ICD-10-CM | POA: Diagnosis not present

## 2019-06-06 DIAGNOSIS — T8149XD Infection following a procedure, other surgical site, subsequent encounter: Secondary | ICD-10-CM | POA: Diagnosis not present

## 2019-06-06 DIAGNOSIS — E1169 Type 2 diabetes mellitus with other specified complication: Secondary | ICD-10-CM | POA: Diagnosis not present

## 2019-06-06 DIAGNOSIS — E1122 Type 2 diabetes mellitus with diabetic chronic kidney disease: Secondary | ICD-10-CM | POA: Diagnosis not present

## 2019-06-06 DIAGNOSIS — N183 Chronic kidney disease, stage 3 (moderate): Secondary | ICD-10-CM | POA: Diagnosis not present

## 2019-06-06 DIAGNOSIS — I272 Pulmonary hypertension, unspecified: Secondary | ICD-10-CM | POA: Diagnosis not present

## 2019-06-06 DIAGNOSIS — M1712 Unilateral primary osteoarthritis, left knee: Secondary | ICD-10-CM | POA: Diagnosis not present

## 2019-06-13 DIAGNOSIS — N183 Chronic kidney disease, stage 3 (moderate): Secondary | ICD-10-CM | POA: Diagnosis not present

## 2019-06-13 DIAGNOSIS — M1712 Unilateral primary osteoarthritis, left knee: Secondary | ICD-10-CM | POA: Diagnosis not present

## 2019-06-13 DIAGNOSIS — E1169 Type 2 diabetes mellitus with other specified complication: Secondary | ICD-10-CM | POA: Diagnosis not present

## 2019-06-13 DIAGNOSIS — E1122 Type 2 diabetes mellitus with diabetic chronic kidney disease: Secondary | ICD-10-CM | POA: Diagnosis not present

## 2019-06-13 DIAGNOSIS — T8149XD Infection following a procedure, other surgical site, subsequent encounter: Secondary | ICD-10-CM | POA: Diagnosis not present

## 2019-06-13 DIAGNOSIS — I1310 Hypertensive heart and chronic kidney disease without heart failure, with stage 1 through stage 4 chronic kidney disease, or unspecified chronic kidney disease: Secondary | ICD-10-CM | POA: Diagnosis not present

## 2019-06-13 DIAGNOSIS — E785 Hyperlipidemia, unspecified: Secondary | ICD-10-CM | POA: Diagnosis not present

## 2019-06-13 DIAGNOSIS — I272 Pulmonary hypertension, unspecified: Secondary | ICD-10-CM | POA: Diagnosis not present

## 2019-06-13 DIAGNOSIS — E669 Obesity, unspecified: Secondary | ICD-10-CM | POA: Diagnosis not present

## 2019-06-22 DIAGNOSIS — T8149XD Infection following a procedure, other surgical site, subsequent encounter: Secondary | ICD-10-CM | POA: Diagnosis not present

## 2019-06-29 DIAGNOSIS — I10 Essential (primary) hypertension: Secondary | ICD-10-CM | POA: Diagnosis not present

## 2019-06-29 DIAGNOSIS — N183 Chronic kidney disease, stage 3 (moderate): Secondary | ICD-10-CM | POA: Diagnosis not present

## 2019-07-20 DIAGNOSIS — T8149XD Infection following a procedure, other surgical site, subsequent encounter: Secondary | ICD-10-CM | POA: Diagnosis not present

## 2019-07-30 DIAGNOSIS — E1165 Type 2 diabetes mellitus with hyperglycemia: Secondary | ICD-10-CM | POA: Diagnosis not present

## 2019-07-30 DIAGNOSIS — I10 Essential (primary) hypertension: Secondary | ICD-10-CM | POA: Diagnosis not present

## 2019-08-29 DIAGNOSIS — I10 Essential (primary) hypertension: Secondary | ICD-10-CM | POA: Diagnosis not present

## 2019-08-29 DIAGNOSIS — E1165 Type 2 diabetes mellitus with hyperglycemia: Secondary | ICD-10-CM | POA: Diagnosis not present

## 2019-09-29 DIAGNOSIS — E1165 Type 2 diabetes mellitus with hyperglycemia: Secondary | ICD-10-CM | POA: Diagnosis not present

## 2019-09-29 DIAGNOSIS — I10 Essential (primary) hypertension: Secondary | ICD-10-CM | POA: Diagnosis not present

## 2019-11-05 DIAGNOSIS — Z6832 Body mass index (BMI) 32.0-32.9, adult: Secondary | ICD-10-CM | POA: Diagnosis not present

## 2019-11-05 DIAGNOSIS — E1165 Type 2 diabetes mellitus with hyperglycemia: Secondary | ICD-10-CM | POA: Diagnosis not present

## 2019-11-05 DIAGNOSIS — N1831 Chronic kidney disease, stage 3a: Secondary | ICD-10-CM | POA: Diagnosis not present

## 2019-11-05 DIAGNOSIS — Z23 Encounter for immunization: Secondary | ICD-10-CM | POA: Diagnosis not present

## 2019-11-05 DIAGNOSIS — I10 Essential (primary) hypertension: Secondary | ICD-10-CM | POA: Diagnosis not present

## 2019-12-06 DIAGNOSIS — I10 Essential (primary) hypertension: Secondary | ICD-10-CM | POA: Diagnosis not present

## 2019-12-06 DIAGNOSIS — N183 Chronic kidney disease, stage 3 unspecified: Secondary | ICD-10-CM | POA: Diagnosis not present

## 2020-01-06 DIAGNOSIS — N183 Chronic kidney disease, stage 3 unspecified: Secondary | ICD-10-CM | POA: Diagnosis not present

## 2020-01-06 DIAGNOSIS — E1165 Type 2 diabetes mellitus with hyperglycemia: Secondary | ICD-10-CM | POA: Diagnosis not present

## 2020-02-04 DIAGNOSIS — Z0001 Encounter for general adult medical examination with abnormal findings: Secondary | ICD-10-CM | POA: Diagnosis not present

## 2020-02-04 DIAGNOSIS — N1831 Chronic kidney disease, stage 3a: Secondary | ICD-10-CM | POA: Diagnosis not present

## 2020-02-04 DIAGNOSIS — E1165 Type 2 diabetes mellitus with hyperglycemia: Secondary | ICD-10-CM | POA: Diagnosis not present

## 2020-02-04 DIAGNOSIS — Z1389 Encounter for screening for other disorder: Secondary | ICD-10-CM | POA: Diagnosis not present

## 2020-02-04 DIAGNOSIS — Z1331 Encounter for screening for depression: Secondary | ICD-10-CM | POA: Diagnosis not present

## 2020-03-06 DIAGNOSIS — N1831 Chronic kidney disease, stage 3a: Secondary | ICD-10-CM | POA: Diagnosis not present

## 2020-03-06 DIAGNOSIS — I10 Essential (primary) hypertension: Secondary | ICD-10-CM | POA: Diagnosis not present

## 2020-04-05 DIAGNOSIS — E1165 Type 2 diabetes mellitus with hyperglycemia: Secondary | ICD-10-CM | POA: Diagnosis not present

## 2020-04-05 DIAGNOSIS — I10 Essential (primary) hypertension: Secondary | ICD-10-CM | POA: Diagnosis not present

## 2020-05-06 DIAGNOSIS — I10 Essential (primary) hypertension: Secondary | ICD-10-CM | POA: Diagnosis not present

## 2020-05-06 DIAGNOSIS — E1165 Type 2 diabetes mellitus with hyperglycemia: Secondary | ICD-10-CM | POA: Diagnosis not present

## 2020-06-05 DIAGNOSIS — I10 Essential (primary) hypertension: Secondary | ICD-10-CM | POA: Diagnosis not present

## 2020-06-05 DIAGNOSIS — N1831 Chronic kidney disease, stage 3a: Secondary | ICD-10-CM | POA: Diagnosis not present

## 2020-07-06 DIAGNOSIS — N1831 Chronic kidney disease, stage 3a: Secondary | ICD-10-CM | POA: Diagnosis not present

## 2020-07-06 DIAGNOSIS — E1165 Type 2 diabetes mellitus with hyperglycemia: Secondary | ICD-10-CM | POA: Diagnosis not present

## 2020-07-18 DIAGNOSIS — Z6832 Body mass index (BMI) 32.0-32.9, adult: Secondary | ICD-10-CM | POA: Diagnosis not present

## 2020-07-18 DIAGNOSIS — I10 Essential (primary) hypertension: Secondary | ICD-10-CM | POA: Diagnosis not present

## 2020-07-18 DIAGNOSIS — E1165 Type 2 diabetes mellitus with hyperglycemia: Secondary | ICD-10-CM | POA: Diagnosis not present

## 2020-07-18 DIAGNOSIS — Z0001 Encounter for general adult medical examination with abnormal findings: Secondary | ICD-10-CM | POA: Diagnosis not present

## 2020-07-30 DIAGNOSIS — N1831 Chronic kidney disease, stage 3a: Secondary | ICD-10-CM | POA: Diagnosis not present

## 2020-07-30 DIAGNOSIS — I10 Essential (primary) hypertension: Secondary | ICD-10-CM | POA: Diagnosis not present

## 2020-07-30 DIAGNOSIS — E1165 Type 2 diabetes mellitus with hyperglycemia: Secondary | ICD-10-CM | POA: Diagnosis not present

## 2020-07-30 DIAGNOSIS — Z6834 Body mass index (BMI) 34.0-34.9, adult: Secondary | ICD-10-CM | POA: Diagnosis not present

## 2020-08-01 ENCOUNTER — Other Ambulatory Visit: Payer: Self-pay

## 2020-08-01 NOTE — Patient Outreach (Addendum)
[place] [facility]) Care Management  08/01/2020  MATTY VANROEKEL Sr. 01-Sep-1941 [id number]   Telephone Screen  Referral Date: 07/31/2020 Referral Source: MD Office Referral Reason: "assistance with paying for DM & HTN medications" Insurance: [facility]   Outreach attempt # 1 to patient. Main number listed for patient belong to his sister who reported best contact number for patient was home phone listed. Spoke with patient who reported he was doing well. RN CM reviewed and discussed referral source and reason. Patient laughed at referral. He states he was just in to see PCP and MD ordered two new meds. He told MD that he would get a small quantity of med from local pharmacy and then get 90 day supply from [facility] order. He voices that MD must have thought that this meant he was unable to afford meds. Patient reports that he only likes to use mail order for meds as he gets them free or at very low cost. He repeated multiple times that he was having no issues affording meds. Patient verbalizes he is able to manage his meds on his own. He states he has had DM and HTN for some time and "knows all he needs to know" and does desire further education at this time. He denies any THN needs or concerns at this time.     Plan: RN CM will close case at this time.    Enzo Montgomery, RN,BSN,CCM [place] Management Telephonic Care Management Coordinator Direct Phone: [phone] Toll Free: [phone] Fax: [phone]

## 2020-08-29 DIAGNOSIS — I10 Essential (primary) hypertension: Secondary | ICD-10-CM | POA: Diagnosis not present

## 2020-08-29 DIAGNOSIS — E1165 Type 2 diabetes mellitus with hyperglycemia: Secondary | ICD-10-CM | POA: Diagnosis not present

## 2020-09-18 ENCOUNTER — Ambulatory Visit: Payer: Medicare HMO | Attending: Internal Medicine

## 2020-09-18 DIAGNOSIS — Z23 Encounter for immunization: Secondary | ICD-10-CM

## 2020-09-18 NOTE — Progress Notes (Signed)
   [id number]  Name:  Stephen HACKBART Sr.    MRN: [id number] DOB: Feb 17, 1941  09/18/2020  [name] was observed post Covid-19 immunization for 15 minutes without incident. He was provided with Vaccine Information Sheet and instruction to access the V-Safe system.   [name] was instructed to call 911 with any severe reactions post vaccine: Marland Kitchen Difficulty breathing  . Swelling of face and throat  . A fast heartbeat  . A bad rash all over body  . Dizziness and weakness

## 2020-10-13 DIAGNOSIS — N1831 Chronic kidney disease, stage 3a: Secondary | ICD-10-CM | POA: Diagnosis not present

## 2020-10-13 DIAGNOSIS — I10 Essential (primary) hypertension: Secondary | ICD-10-CM | POA: Diagnosis not present

## 2020-10-13 DIAGNOSIS — E1165 Type 2 diabetes mellitus with hyperglycemia: Secondary | ICD-10-CM | POA: Diagnosis not present

## 2020-10-13 DIAGNOSIS — Z6834 Body mass index (BMI) 34.0-34.9, adult: Secondary | ICD-10-CM | POA: Diagnosis not present

## 2020-10-13 DIAGNOSIS — Z23 Encounter for immunization: Secondary | ICD-10-CM | POA: Diagnosis not present

## 2020-11-06 DIAGNOSIS — I10 Essential (primary) hypertension: Secondary | ICD-10-CM | POA: Diagnosis not present

## 2020-11-06 DIAGNOSIS — Z0001 Encounter for general adult medical examination with abnormal findings: Secondary | ICD-10-CM | POA: Diagnosis not present

## 2020-11-06 DIAGNOSIS — E1165 Type 2 diabetes mellitus with hyperglycemia: Secondary | ICD-10-CM | POA: Diagnosis not present

## 2020-11-13 DIAGNOSIS — I10 Essential (primary) hypertension: Secondary | ICD-10-CM | POA: Diagnosis not present

## 2020-11-13 DIAGNOSIS — N1831 Chronic kidney disease, stage 3a: Secondary | ICD-10-CM | POA: Diagnosis not present

## 2020-11-13 DIAGNOSIS — E1165 Type 2 diabetes mellitus with hyperglycemia: Secondary | ICD-10-CM | POA: Diagnosis not present

## 2020-12-14 DIAGNOSIS — I10 Essential (primary) hypertension: Secondary | ICD-10-CM | POA: Diagnosis not present

## 2020-12-14 DIAGNOSIS — E1165 Type 2 diabetes mellitus with hyperglycemia: Secondary | ICD-10-CM | POA: Diagnosis not present

## 2020-12-30 DIAGNOSIS — E113293 Type 2 diabetes mellitus with mild nonproliferative diabetic retinopathy without macular edema, bilateral: Secondary | ICD-10-CM | POA: Diagnosis not present

## 2021-01-14 DIAGNOSIS — E1165 Type 2 diabetes mellitus with hyperglycemia: Secondary | ICD-10-CM | POA: Diagnosis not present

## 2021-01-14 DIAGNOSIS — I10 Essential (primary) hypertension: Secondary | ICD-10-CM | POA: Diagnosis not present

## 2021-02-11 DIAGNOSIS — E1165 Type 2 diabetes mellitus with hyperglycemia: Secondary | ICD-10-CM | POA: Diagnosis not present

## 2021-02-11 DIAGNOSIS — I10 Essential (primary) hypertension: Secondary | ICD-10-CM | POA: Diagnosis not present

## 2021-02-18 DIAGNOSIS — H401112 Primary open-angle glaucoma, right eye, moderate stage: Secondary | ICD-10-CM | POA: Diagnosis not present

## 2021-02-18 DIAGNOSIS — H401121 Primary open-angle glaucoma, left eye, mild stage: Secondary | ICD-10-CM | POA: Diagnosis not present

## 2021-03-14 DIAGNOSIS — N1831 Chronic kidney disease, stage 3a: Secondary | ICD-10-CM | POA: Diagnosis not present

## 2021-03-14 DIAGNOSIS — E1165 Type 2 diabetes mellitus with hyperglycemia: Secondary | ICD-10-CM | POA: Diagnosis not present

## 2021-04-13 DIAGNOSIS — E1165 Type 2 diabetes mellitus with hyperglycemia: Secondary | ICD-10-CM | POA: Diagnosis not present

## 2021-04-13 DIAGNOSIS — I10 Essential (primary) hypertension: Secondary | ICD-10-CM | POA: Diagnosis not present

## 2021-05-08 DIAGNOSIS — Z1389 Encounter for screening for other disorder: Secondary | ICD-10-CM | POA: Diagnosis not present

## 2021-05-08 DIAGNOSIS — E118 Type 2 diabetes mellitus with unspecified complications: Secondary | ICD-10-CM | POA: Diagnosis not present

## 2021-05-08 DIAGNOSIS — I10 Essential (primary) hypertension: Secondary | ICD-10-CM | POA: Diagnosis not present

## 2021-05-08 DIAGNOSIS — E785 Hyperlipidemia, unspecified: Secondary | ICD-10-CM | POA: Diagnosis not present

## 2021-05-08 DIAGNOSIS — Z0001 Encounter for general adult medical examination with abnormal findings: Secondary | ICD-10-CM | POA: Diagnosis not present

## 2021-05-08 DIAGNOSIS — Z1331 Encounter for screening for depression: Secondary | ICD-10-CM | POA: Diagnosis not present

## 2021-05-27 DIAGNOSIS — I10 Essential (primary) hypertension: Secondary | ICD-10-CM | POA: Diagnosis not present

## 2021-05-27 DIAGNOSIS — Z8249 Family history of ischemic heart disease and other diseases of the circulatory system: Secondary | ICD-10-CM | POA: Diagnosis not present

## 2021-05-27 DIAGNOSIS — Z7982 Long term (current) use of aspirin: Secondary | ICD-10-CM | POA: Diagnosis not present

## 2021-05-27 DIAGNOSIS — E119 Type 2 diabetes mellitus without complications: Secondary | ICD-10-CM | POA: Diagnosis not present

## 2021-05-27 DIAGNOSIS — E669 Obesity, unspecified: Secondary | ICD-10-CM | POA: Diagnosis not present

## 2021-05-27 DIAGNOSIS — Z008 Encounter for other general examination: Secondary | ICD-10-CM | POA: Diagnosis not present

## 2021-05-27 DIAGNOSIS — E785 Hyperlipidemia, unspecified: Secondary | ICD-10-CM | POA: Diagnosis not present

## 2021-05-27 DIAGNOSIS — Z7984 Long term (current) use of oral hypoglycemic drugs: Secondary | ICD-10-CM | POA: Diagnosis not present

## 2021-05-27 DIAGNOSIS — Z6833 Body mass index (BMI) 33.0-33.9, adult: Secondary | ICD-10-CM | POA: Diagnosis not present

## 2021-05-27 DIAGNOSIS — Z833 Family history of diabetes mellitus: Secondary | ICD-10-CM | POA: Diagnosis not present

## 2021-06-05 NOTE — Progress Notes (Deleted)
Cardiology Office Note    Date:  06/05/2021   ID:  Stephen Bales Sr., DOB Apr 30, 1941, MRN [id number]   PCP:  No primary care provider on file.   [place]  Cardiologist:  Carlyle Dolly, MD *** Advanced Practice Provider:  No care team member to display Electrophysiologist:  None   [phone]   No chief complaint on file.   History of Present Illness:  Stephen MITTMAN Sr. is a 80 y.o. male with history of HTN, HLD, DVT, CKD stage 3, chronic mild sinus tachycardia.  Patient last saw Dr. Harl Bowie 08/2018 with uncontrolled HTN. BP 190/100. Lasxi stopped chlorthalidone 25 mg daily and hydralazine inchreased to 100 mg tid.f/u BP much better but still up and was supposed to come back but didn't.  Patient sent by Dr. Legrand Rams for HTN.    Past Medical History:  Diagnosis Date   CKD (chronic kidney disease), stage III ([place])    Diabetes mellitus    DVT (deep venous thrombosis) ([place]) 10/14/2016   Extremity edema 09/2016   Hyperlipemia    Hypertension     Past Surgical History:  Procedure Laterality Date   CHOLECYSTECTOMY N/A 04/23/2019   Procedure: CHOLECYSTECTOMY;  Surgeon: Aviva Signs, MD;  Location: [facility];  Service: General;  Laterality: N/A;   COLONOSCOPY  02/2010   Dr. [name]: diverticuolosis, multiple polyps removed, cecal adenoma.    COLONOSCOPY WITH PROPOFOL N/A 04/19/2019   Procedure: COLONOSCOPY WITH PROPOFOL;  Surgeon: [name], MD;  [facility];  Service: Endoscopy;  Laterality: N/A;   IR GENERIC HISTORICAL  10/19/2016   IR PERC CHOLECYSTOSTOMY 10/19/2016 [name], MD [facility]   IR GENERIC HISTORICAL  10/25/2016   IR CATHETER TUBE CHANGE 10/25/2016 [name], MD [facility]   IR GENERIC HISTORICAL  11/30/2016   IR RADIOLOGIST EVAL & MGMT 11/30/2016 [facility]   PARTIAL COLECTOMY N/A 04/23/2019   Procedure: PARTIAL COLECTOMY;  Surgeon: Aviva Signs, MD;  Location: [facility];  Service: General;   Laterality: N/A;   POLYPECTOMY  04/19/2019   Procedure: POLYPECTOMY;  Surgeon: [name], MD;  [facility];  Service: Endoscopy;;  polyp at recto sigmoid    Current Medications: No outpatient medications have been marked as taking for the 06/09/21 encounter (Appointment) with Imogene Burn, PA-C.     Allergies:   Patient has no known allergies.   Social History   Socioeconomic History   Marital status: Widowed    Spouse name: Not on file   Number of children: Not on file   Years of education: Not on file   Highest education level: Not on file  Occupational History   Not on file  Tobacco Use   Smoking status: Never   Smokeless tobacco: Never  Vaping Use   Vaping Use: Never used  Substance and Sexual Activity   Alcohol use: No   Drug use: No   Sexual activity: Not on file  Other Topics Concern   Not on file  Social History Narrative   Not on file   Social Determinants of Health   Financial Resource Strain: Not on file  Food Insecurity: Not on file  Transportation Needs: Not on file  Physical Activity: Not on file  Stress: Not on file  Social Connections: Not on file     Family History:  The patient's ***family history includes Diabetes type II in his father; Heart attack in his maternal aunt and another family  member; Hypertension in his father; Tuberculosis in some other family members.   ROS:   Please see the history of present illness.    ROS All other systems reviewed and are negative.   PHYSICAL EXAM:   VS:  There were no vitals taken for this visit.  Physical Exam  GEN: Well nourished, well developed, in no acute distress  HEENT: normal  Neck: no JVD, carotid bruits, or masses Cardiac:RRR; no murmurs, rubs, or gallops  Respiratory:  clear to auscultation bilaterally, normal work of breathing GI: soft, nontender, nondistended, + BS Ext: without cyanosis, clubbing, or edema, Good distal pulses bilaterally MS: no deformity or atrophy   Skin: warm and dry, no rash Neuro:  Alert and Oriented x 3, Strength and sensation are intact Psych: euthymic mood, full affect  Wt Readings from Last 3 Encounters:  05/15/19 199 lb 4.7 oz (90.4 kg)  05/15/19 200 lb 6.4 oz (90.9 kg)  05/14/19 221 lb (100.2 kg)      Studies/Labs Reviewed:   EKG:  EKG is*** ordered today.  The ekg ordered today demonstrates ***  Recent Labs: No results found for requested labs within last 8760 hours.   Lipid Panel No results found for: CHOL, TRIG, HDL, CHOLHDL, VLDL, LDLCALC, LDLDIRECT  Additional studies/ records that were reviewed today include:  09/2016 echo Study Conclusions   - Left ventricle: The cavity size was normal. Wall thickness was   increased in a pattern of mild LVH. Systolic function was   vigorous. The estimated ejection fraction was in the range of 65%   to 70%. Wall motion was normal; there were no regional wall   motion abnormalities. Doppler parameters are consistent with   abnormal left ventricular relaxation (grade 1 diastolic   dysfunction). - Aortic valve: Mildly calcified annulus. Trileaflet. - Mitral valve: There was trivial regurgitation. - Left atrium: The atrium was mildly dilated. - Right atrium: Central venous pressure (est): 3 mm Hg. - Atrial septum: No defect or patent foramen ovale was identified. - Tricuspid valve: There was mild regurgitation. - Pulmonary arteries: PA peak pressure: 35 mm Hg (S). - Pericardium, extracardiac: There was no pericardial effusion.   Impressions:   - Mild LVH with sigmoid-shaped basal septum and LVEF 65-70%. Grade   1 diastolic dysfunction. Mild left atrial enlargement. Trivial   mitral regurgitation. Mild calcified aortic annulus. Mild   tricuspid regurgitation with PASP 35 mmHg.       Risk Assessment/Calculations:   {Does this patient have ATRIAL FIBRILLATION?:[id number]}     ASSESSMENT:    No diagnosis found.   PLAN:  In order of problems listed  above:  HTN uncontrolled  Sinus tachycardia  HLD  CKD stage 3  DM  Shared Decision Making/Informed Consent   {Are you ordering a CV Procedure (e.g. stress test, cath, DCCV, TEE, etc)?   Press F2        :[id number]}    Medication Adjustments/Labs and Tests Ordered: Current medicines are reviewed at length with the patient today.  Concerns regarding medicines are outlined above.  Medication changes, Labs and Tests ordered today are listed in the Patient Instructions below. There are no Patient Instructions on file for this visit.   Sumner Boast, PA-C  06/05/2021 8:35 AM    [facility] [place], [place],   [address] Phone: [phone]; Fax: [phone]

## 2021-06-06 DIAGNOSIS — E1165 Type 2 diabetes mellitus with hyperglycemia: Secondary | ICD-10-CM | POA: Diagnosis not present

## 2021-06-06 DIAGNOSIS — I10 Essential (primary) hypertension: Secondary | ICD-10-CM | POA: Diagnosis not present

## 2021-06-09 ENCOUNTER — Ambulatory Visit: Payer: Medicare HMO | Admitting: Physician Assistant

## 2021-07-06 DIAGNOSIS — E1165 Type 2 diabetes mellitus with hyperglycemia: Secondary | ICD-10-CM | POA: Diagnosis not present

## 2021-07-06 DIAGNOSIS — I10 Essential (primary) hypertension: Secondary | ICD-10-CM | POA: Diagnosis not present

## 2021-07-09 DIAGNOSIS — E1165 Type 2 diabetes mellitus with hyperglycemia: Secondary | ICD-10-CM | POA: Diagnosis not present

## 2021-07-09 DIAGNOSIS — I10 Essential (primary) hypertension: Secondary | ICD-10-CM | POA: Diagnosis not present

## 2021-07-14 NOTE — Progress Notes (Signed)
Cardiology Office Note    Date:  07/15/2021   ID:  Stephen TOLER Sr., DOB [birth year], MRN [id number]  PCP:  [name], MD  Cardiologist: Carlyle Dolly, MD    Chief Complaint  Patient presents with   Follow-up    Overdue Visit     History of Present Illness:    Stephen MARTUS Sr. is a 80 y.o. male with past medical history of HTN, HLD, Type II DM and Stage III CKD who presents to the office today for overdue follow-up.  He was last examined by Dr. Harl Bowie in 08/2018 and reported having baseline tachycardia since he was a teenager but denied any associated chest pain, dyspnea or palpitations. His blood pressure was elevated at 190/100, therefore Lasix was discontinued and he was started on Chlorthalidone 25 mg daily and Hydralazine was increased to 100 mg TID. He was informed to follow-up in 6 months but has not been evaluated by Cardiology since.  In talking with the patient today, he reports overall doing well since his last office visit. 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 He denies any exertional chest pain or dyspnea on exertion. No reported orthopnea, PND, or palpitations. He does sometimes experience lower extremity edema if he sits for long periods of time. Says that he does feel his pulse occasionally and notices that this is slower in the morning hours but denies any associated dizziness or presyncope at that time. He reports checking his BP and HR at home but is unable to recall the specific values.   Past Medical History:  Diagnosis Date   CKD (chronic kidney disease), stage III ([place])    Diabetes mellitus    DVT (deep venous thrombosis) ([place]) 10/14/2016   Extremity edema 09/2016   Hyperlipemia    Hypertension     Past Surgical History:  Procedure Laterality Date   CHOLECYSTECTOMY N/A 04/23/2019   Procedure: CHOLECYSTECTOMY;  Surgeon:  Aviva Signs, MD;  Location: [facility];  Service: General;  Laterality: N/A;   COLONOSCOPY  02/2010   Dr. [name]: diverticuolosis, multiple polyps removed, cecal adenoma.    COLONOSCOPY WITH PROPOFOL N/A 04/19/2019   Procedure: COLONOSCOPY WITH PROPOFOL;  Surgeon: [name], MD;  [facility];  Service: Endoscopy;  Laterality: N/A;   IR GENERIC HISTORICAL  10/19/2016   IR PERC CHOLECYSTOSTOMY 10/19/2016 [name], MD [facility]   IR GENERIC HISTORICAL  10/25/2016   IR CATHETER TUBE CHANGE 10/25/2016 [name], MD [facility]   IR GENERIC HISTORICAL  11/30/2016   IR RADIOLOGIST EVAL & MGMT 11/30/2016 [facility]   PARTIAL COLECTOMY N/A 04/23/2019   Procedure: PARTIAL COLECTOMY;  Surgeon: Aviva Signs, MD;  Location: [facility];  Service: General;  Laterality: N/A;   POLYPECTOMY  04/19/2019   Procedure: POLYPECTOMY;  Surgeon: [name], MD;  [facility];  Service: Endoscopy;;  polyp at recto sigmoid    Current Medications: Outpatient Medications Prior to Visit  Medication Sig Dispense Refill   aspirin EC 81 MG tablet Take 81 mg by mouth daily.     atorvastatin (LIPITOR) 40 MG tablet Take 40 mg by mouth daily.     hydrALAZINE (APRESOLINE) 100 MG tablet Take 0.5 tablets (50 mg total) by mouth 3 (three) times daily. 90 tablet 3   isosorbide mononitrate (IMDUR) 60 MG 24 hr  tablet Take 1 tablet (60 mg total) by mouth daily. 90 tablet 1   metFORMIN (GLUCOPHAGE) 500 MG tablet Take 500 mg by mouth 2 (two) times a day.     NON FORMULARY Diet Type:  NAS, Consistent Carbohydrate, bland diet     TRADJENTA 5 MG TABS tablet Take 5 mg by mouth daily.     verapamil (CALAN-SR) 240 MG CR tablet Take 240 mg by mouth daily.     acetaminophen (TYLENOL) 325 MG tablet Take 2 tablets (650 mg total) by mouth every 6 (six) hours as needed for mild pain, fever or headache (or Fever >/= 101). (Patient not taking: Reported on 07/15/2021) 20 tablet 0   Amino Acids-Protein Hydrolys  (FEEDING SUPPLEMENT, PRO-STAT SUGAR FREE 64,) LIQD Take 30 mLs by mouth 2 (two) times daily between meals. (Patient not taking: Reported on 07/15/2021)     amoxicillin-clavulanate (AUGMENTIN) 875-125 MG tablet Take 1 tablet by mouth every 12 (twelve) hours. (Patient not taking: Reported on 07/15/2021) 10 tablet 0   losartan-hydrochlorothiazide (HYZAAR) 100-25 MG tablet Take 1 tablet by mouth daily.     Nutritional Supplements (ENSURE CLEAR) LIQD Take 1 Bottle by mouth 2 (two) times daily between meals. (Patient not taking: Reported on 07/15/2021)     Nutritional Supplements (ENSURE ENLIVE PO) Take 1 Bottle by mouth daily. (Patient not taking: Reported on 07/15/2021)     ondansetron (ZOFRAN-ODT) 4 MG disintegrating tablet Take 1 tablet (4 mg total) by mouth every 6 (six) hours as needed for nausea. (Patient not taking: Reported on 07/15/2021) 20 tablet 0   pantoprazole (PROTONIX) 40 MG tablet Take 1 tablet (40 mg total) by mouth daily. (Patient not taking: Reported on 07/15/2021) 30 tablet 2   polyethylene glycol powder (GLYCOLAX/MIRALAX) 17 GM/SCOOP powder Mix 17 gm or a scoop in 8 oz of water and drink one to two times a day (Patient not taking: Reported on 07/15/2021) 255 g 0   verapamil (CALAN-SR) 120 MG CR tablet Take 1 tablet (120 mg total) by mouth daily. 30 tablet 1   No facility-administered medications prior to visit.     Allergies:   Patient has no known allergies.   Social History   Socioeconomic History   Marital status: Widowed    Spouse name: Not on file   Number of children: Not on file   Years of education: Not on file   Highest education level: Not on file  Occupational History   Not on file  Tobacco Use   Smoking status: Never   Smokeless tobacco: Never  Vaping Use   Vaping Use: Never used  Substance and Sexual Activity   Alcohol use: No   Drug use: No   Sexual activity: Not on file  Other Topics Concern   Not on file  Social History Narrative   Not on file   Social  Determinants of Health   Financial Resource Strain: Not on file  Food Insecurity: Not on file  Transportation Needs: Not on file  Physical Activity: Not on file  Stress: Not on file  Social Connections: Not on file     Family History:  The patient's family history includes Diabetes type II in his father; Heart attack in his maternal aunt and another family member; Hypertension in his father; Tuberculosis in some other family members.   Review of Systems:    Please see the history of present illness.     All other systems reviewed and are otherwise negative except as noted above.  Physical Exam:    VS:  BP (!) 144/82   Pulse 94   Ht '5\' 6"'$  (1.676 m)   Wt 210 lb (95.3 kg)   SpO2 96%   BMI 33.89 kg/m    General: Pleasant elderly male appearing in no acute distress. Head: Normocephalic, atraumatic. Neck: No carotid bruits. JVD not elevated.  Lungs: Respirations regular and unlabored, without wheezes or rales.  Heart: Regular rate and rhythm. No S3 or S4.  No murmur, no rubs, or gallops appreciated. Abdomen: Appears non-distended. No obvious abdominal masses. Msk:  Strength and tone appear normal for age. No obvious joint deformities or effusions. Extremities: No clubbing or cyanosis. Trace ankle edema bilaterally.  Distal pedal pulses are 2+ bilaterally. Neuro: Alert and oriented X 3. Moves all extremities spontaneously. No focal deficits noted. Psych:  Responds to questions appropriately with a normal affect. Skin: No rashes or lesions noted  Wt Readings from Last 3 Encounters:  07/15/21 210 lb (95.3 kg)  05/15/19 199 lb 4.7 oz (90.4 kg)  05/15/19 200 lb 6.4 oz (90.9 kg)     Studies/Labs Reviewed:   EKG:  EKG is ordered today.  The ekg ordered today demonstrates NSR, HR 94 with LVH and associated repol abnormalities along the inferior and lateral leads which is similar to prior tracings.   Recent Labs: No results found for requested labs within last 8760 hours.    Lipid Panel No results found for: CHOL, TRIG, HDL, CHOLHDL, VLDL, LDLCALC, LDLDIRECT  Additional studies/ records that were reviewed today include:   Echocardiogram: 09/2016 Study Conclusions   - Left ventricle: The cavity size was normal. Wall thickness was    increased in a pattern of mild LVH. Systolic function was    vigorous. The estimated ejection fraction was in the range of 65%    to 70%. Wall motion was normal; there were no regional wall    motion abnormalities. Doppler parameters are consistent with    abnormal left ventricular relaxation (grade 1 diastolic    dysfunction).  - Aortic valve: Mildly calcified annulus. Trileaflet.  - Mitral valve: There was trivial regurgitation.  - Left atrium: The atrium was mildly dilated.  - Right atrium: Central venous pressure (est): 3 mm Hg.  - Atrial septum: No defect or patent foramen ovale was identified.  - Tricuspid valve: There was mild regurgitation.  - Pulmonary arteries: PA peak pressure: 35 mm Hg (S).  - Pericardium, extracardiac: There was no pericardial effusion.   Impressions:   - Mild LVH with sigmoid-shaped basal septum and LVEF 65-70%. Grade    1 diastolic dysfunction. Mild left atrial enlargement. Trivial    mitral regurgitation. Mild calcified aortic annulus. Mild    tricuspid regurgitation with PASP 35 mmHg.   Assessment:    1. Essential hypertension   2. Mixed hyperlipidemia   3. Stage 3a chronic kidney disease ([place])      Plan:   In order of problems listed above:  1. HTN - He was referred back to Cardiology for hypertension and BP was initially recorded at 158/78 during today's visit and improved to 144/82 on recheck. He reports having a history of whitecoat hypertension and says that his BP is typically well controlled at home but is unable to recall specific values. I did provide him with a BP log today and encouraged him to fill this out and return in 3 to 4 weeks. Pending his readings, would  consider further adjustment of his medications at that time. He is  currently on Hydralazine 100 mg twice daily, Imdur 60 mg daily, Losartan-HCTZ 100-25 mg daily and Verapamil 240 mg daily. Hydralazine could be further titrated if needed or HCTZ switched to Chlorthalidone. If he is found to have episodes of bradycardia, would recommend an event monitor to rule out any significant arrhythmias.  2. HLD - Followed by his PCP. He remains on Atorvastatin 40 mg daily. He did have follow-up labs last week and I will request a copy of these from [facility].  3. Stage 3 CKD - Most recent labs in Epic showed his creatinine was at 1.62 in 04/2019. Will request most recent labs from his PCP.   Medication Adjustments/Labs and Tests Ordered: Current medicines are reviewed at length with the patient today.  Concerns regarding medicines are outlined above.  Medication changes, Labs and Tests ordered today are listed in the Patient Instructions below. Patient Instructions  Medication Instructions:   Continue current medication regimen.   Labwork:  None today. We will request labs from your PCP.   Testing/Procedures:  None today.   Follow-Up:  In 6 months with Dr. Harl Bowie or Altamese Cabal in 6 months.   Any Other Special Instructions Will Be Listed Below (If Applicable).  Keep Blood Pressure Log for 3-4 weeks and return to the office.    If you need a refill on your cardiac medications before your next appointment, please call your pharmacy.'   Signed, Erma Heritage, PA-C  07/15/2021 4:50 PM    [facility] [address] [place], [place] [address] Phone: [phone] Fax: [phone]

## 2021-07-15 ENCOUNTER — Other Ambulatory Visit: Payer: Self-pay

## 2021-07-15 ENCOUNTER — Ambulatory Visit: Payer: Medicare HMO | Admitting: Student

## 2021-07-15 ENCOUNTER — Encounter: Payer: Self-pay | Admitting: Student

## 2021-07-15 VITALS — BP 144/82 | HR 94 | Ht 66.0 in | Wt 210.0 lb

## 2021-07-15 DIAGNOSIS — N1831 Chronic kidney disease, stage 3a: Secondary | ICD-10-CM

## 2021-07-15 DIAGNOSIS — E782 Mixed hyperlipidemia: Secondary | ICD-10-CM | POA: Diagnosis not present

## 2021-07-15 DIAGNOSIS — I10 Essential (primary) hypertension: Secondary | ICD-10-CM

## 2021-07-15 NOTE — Patient Instructions (Signed)
Medication Instructions:   Continue current medication regimen.   Labwork:  None today. We will request labs from your PCP.   Testing/Procedures:  None today.   Follow-Up:  In 6 months with Dr. Harl Bowie or Altamese Cabal in 6 months.   Any Other Special Instructions Will Be Listed Below (If Applicable).  Keep Blood Pressure Log for 3-4 weeks and return to the office.    If you need a refill on your cardiac medications before your next appointment, please call your pharmacy.'

## 2021-08-10 DIAGNOSIS — I10 Essential (primary) hypertension: Secondary | ICD-10-CM | POA: Diagnosis not present

## 2021-08-10 DIAGNOSIS — E1165 Type 2 diabetes mellitus with hyperglycemia: Secondary | ICD-10-CM | POA: Diagnosis not present

## 2021-09-09 DIAGNOSIS — I10 Essential (primary) hypertension: Secondary | ICD-10-CM | POA: Diagnosis not present

## 2021-09-09 DIAGNOSIS — E1165 Type 2 diabetes mellitus with hyperglycemia: Secondary | ICD-10-CM | POA: Diagnosis not present

## 2021-10-10 DIAGNOSIS — E1165 Type 2 diabetes mellitus with hyperglycemia: Secondary | ICD-10-CM | POA: Diagnosis not present

## 2021-10-10 DIAGNOSIS — I10 Essential (primary) hypertension: Secondary | ICD-10-CM | POA: Diagnosis not present

## 2021-11-18 DIAGNOSIS — Z23 Encounter for immunization: Secondary | ICD-10-CM | POA: Diagnosis not present

## 2021-11-18 DIAGNOSIS — E785 Hyperlipidemia, unspecified: Secondary | ICD-10-CM | POA: Diagnosis not present

## 2021-11-18 DIAGNOSIS — I10 Essential (primary) hypertension: Secondary | ICD-10-CM | POA: Diagnosis not present

## 2021-11-18 DIAGNOSIS — N1831 Chronic kidney disease, stage 3a: Secondary | ICD-10-CM | POA: Diagnosis not present

## 2021-11-18 DIAGNOSIS — E1165 Type 2 diabetes mellitus with hyperglycemia: Secondary | ICD-10-CM | POA: Diagnosis not present

## 2021-12-19 DIAGNOSIS — I10 Essential (primary) hypertension: Secondary | ICD-10-CM | POA: Diagnosis not present

## 2021-12-19 DIAGNOSIS — E1165 Type 2 diabetes mellitus with hyperglycemia: Secondary | ICD-10-CM | POA: Diagnosis not present

## 2022-01-19 DIAGNOSIS — I10 Essential (primary) hypertension: Secondary | ICD-10-CM | POA: Diagnosis not present

## 2022-01-19 DIAGNOSIS — E1165 Type 2 diabetes mellitus with hyperglycemia: Secondary | ICD-10-CM | POA: Diagnosis not present

## 2022-02-16 DIAGNOSIS — I10 Essential (primary) hypertension: Secondary | ICD-10-CM | POA: Diagnosis not present

## 2022-02-16 DIAGNOSIS — E1165 Type 2 diabetes mellitus with hyperglycemia: Secondary | ICD-10-CM | POA: Diagnosis not present

## 2022-03-19 DIAGNOSIS — I10 Essential (primary) hypertension: Secondary | ICD-10-CM | POA: Diagnosis not present

## 2022-03-19 DIAGNOSIS — E1165 Type 2 diabetes mellitus with hyperglycemia: Secondary | ICD-10-CM | POA: Diagnosis not present

## 2022-04-18 DIAGNOSIS — I10 Essential (primary) hypertension: Secondary | ICD-10-CM | POA: Diagnosis not present

## 2022-04-18 DIAGNOSIS — E1165 Type 2 diabetes mellitus with hyperglycemia: Secondary | ICD-10-CM | POA: Diagnosis not present

## 2022-04-22 DIAGNOSIS — I10 Essential (primary) hypertension: Secondary | ICD-10-CM | POA: Diagnosis not present

## 2022-04-22 DIAGNOSIS — E1165 Type 2 diabetes mellitus with hyperglycemia: Secondary | ICD-10-CM | POA: Diagnosis not present

## 2022-05-04 ENCOUNTER — Encounter: Payer: Self-pay | Admitting: Student

## 2022-05-04 ENCOUNTER — Ambulatory Visit: Payer: Medicare HMO | Admitting: Student

## 2022-05-04 VITALS — BP 148/82 | HR 96 | Ht 66.0 in | Wt 202.2 lb

## 2022-05-04 DIAGNOSIS — E782 Mixed hyperlipidemia: Secondary | ICD-10-CM

## 2022-05-04 DIAGNOSIS — I10 Essential (primary) hypertension: Secondary | ICD-10-CM

## 2022-05-04 DIAGNOSIS — N1831 Chronic kidney disease, stage 3a: Secondary | ICD-10-CM | POA: Diagnosis not present

## 2022-05-04 MED ORDER — HYDRALAZINE HCL 100 MG PO TABS: 100.0000 mg | ORAL_TABLET | Freq: Two times a day (BID) | ORAL | 3 refills | Status: DC

## 2022-05-04 MED ORDER — ISOSORBIDE MONONITRATE ER 60 MG PO TB24: 60.0000 mg | ORAL_TABLET | Freq: Every day | ORAL | 3 refills | Status: DC

## 2022-05-04 NOTE — Progress Notes (Signed)
Cardiology Office Note    Date:  05/04/2022   ID:  Stephen Bales Sr., DOB [birth year], MRN [id number]  PCP:  Carrolyn Meiers, MD  Cardiologist: Carlyle Dolly, MD    Chief Complaint  Patient presents with   Follow-up    6 month visit    History of Present Illness:    Stephen STRACENER Sr. is a 81 y.o. male with past medical history of HTN, HLD, Type II DM and Stage III CKD who presents to the office today for 38-monthfollow-up.  He was last examined by myself in 06/2021 and reported being active for his age and denied any recent anginal symptoms. He reported his BP had been well controlled at home despite being elevated during his office visit. He was encouraged to follow readings at home and report back with this. At that time, he was continued on his current medication regimen including Hydralazine 100 mg twice daily, Imdur 60 mg daily, Losartan-HCTZ 100-25 mg daily and Verapamil 240 mg daily.  In talking with the patient today, he reports overall feeling well since his last office visit. He enjoys walking outside and walks to his mailbox for exercise and denies any recent chest pain or dyspnea on exertion with this. He does not cook as family members typically help with meals. He does try to limit his sodium intake.  He denies any recent orthopnea, PND or pitting edema. He is unsure what dose of Hydralazine he takes as he is listed as taking 50 mg 3 times daily but says he only takes this twice daily.   Past Medical History:  Diagnosis Date   CKD (chronic kidney disease), stage III ([place]    Diabetes mellitus    DV[place]) [place]   Extremity edema 09/2016   Hyperlipemia    Hypertension     Past Surgical History:  Procedure Laterality Date   CHOLECYSTECTOMY N/A 04/23/2019   Procedure: CHOLECYSTECTOMY;  Surgeon: JAviva Signs MD;  Location: [facility];  Service: General;  Laterality: N/A;   COLONOSCOPY  02/2010   Dr. RGala Romney diverticuolosis,  multiple polyps removed, cecal adenoma.    COLONOSCOPY WITH PROPOFOL N/A 04/19/2019   Procedure: COLONOSCOPY WITH PROPOFOL;  Surgeon: RDaneil Dolin MD;  [facility];  Service: Endoscopy;  Laterality: N/A;   IR GENERIC HISTORICAL  10/19/2016   IR PERC CHOLECYSTOSTOMY 10/19/2016 GAletta Edouard MD [facility]   IR GENERIC HISTORICAL  10/25/2016   IR CATHETER TUBE CHANGE 10/25/2016 DArne Cleveland MD [facility]   IR GENERIC HISTORICAL  11/30/2016   IR RADIOLOGIST EVAL & MGMT 11/30/2016 [facility]   PARTIAL COLECTOMY N/A 04/23/2019   Procedure: PARTIAL COLECTOMY;  Surgeon: JAviva Signs MD;  Location: [facility];  Service: General;  Laterality: N/A;   POLYPECTOMY  04/19/2019   Procedure: POLYPECTOMY;  Surgeon: RDaneil Dolin MD;  [facility];  Service: Endoscopy;;  polyp at recto sigmoid    Current Medications: Outpatient Medications Prior to Visit  Medication Sig Dispense Refill   aspirin EC 81 MG tablet Take 81 mg by mouth daily.     atorvastatin (LIPITOR) 40 MG tablet Take 40 mg by mouth daily.     losartan-hydrochlorothiazide (HYZAAR) 100-25 MG tablet Take 1 tablet by mouth daily.     metFORMIN (GLUCOPHAGE) 500 MG tablet Take 500 mg by mouth 2 (two) times a day.     NON FORMULARY Diet Type:  NAS, Consistent Carbohydrate, bland diet  TRADJENTA 5 MG TABS tablet Take 5 mg by mouth daily.     verapamil (CALAN-SR) 240 MG CR tablet Take 240 mg by mouth daily.     hydrALAZINE (APRESOLINE) 100 MG tablet Take 0.5 tablets (50 mg total) by mouth 3 (three) times daily. 90 tablet 3   isosorbide mononitrate (IMDUR) 60 MG 24 hr tablet Take 1 tablet (60 mg total) by mouth daily. 90 tablet 1   acetaminophen (TYLENOL) 325 MG tablet Take 2 tablets (650 mg total) by mouth every 6 (six) hours as needed for mild pain, fever or headache (or Fever >/= 101). (Patient not taking: Reported on 07/15/2021) 20 tablet 0   Amino Acids-Protein Hydrolys (FEEDING SUPPLEMENT, PRO-STAT SUGAR  FREE 64,) LIQD Take 30 mLs by mouth 2 (two) times daily between meals. (Patient not taking: Reported on 07/15/2021)     amoxicillin-clavulanate (AUGMENTIN) 875-125 MG tablet Take 1 tablet by mouth every 12 (twelve) hours. (Patient not taking: Reported on 07/15/2021) 10 tablet 0   Nutritional Supplements (ENSURE CLEAR) LIQD Take 1 Bottle by mouth 2 (two) times daily between meals. (Patient not taking: Reported on 07/15/2021)     Nutritional Supplements (ENSURE ENLIVE PO) Take 1 Bottle by mouth daily. (Patient not taking: Reported on 07/15/2021)     ondansetron (ZOFRAN-ODT) 4 MG disintegrating tablet Take 1 tablet (4 mg total) by mouth every 6 (six) hours as needed for nausea. (Patient not taking: Reported on 07/15/2021) 20 tablet 0   pantoprazole (PROTONIX) 40 MG tablet Take 1 tablet (40 mg total) by mouth daily. (Patient not taking: Reported on 07/15/2021) 30 tablet 2   polyethylene glycol powder (GLYCOLAX/MIRALAX) 17 GM/SCOOP powder Mix 17 gm or a scoop in 8 oz of water and drink one to two times a day (Patient not taking: Reported on 07/15/2021) 255 g 0   No facility-administered medications prior to visit.     Allergies:   Patient has no known allergies.   Social History   Socioeconomic History   Marital status: Widowed    Spouse name: Not on file   Number of children: Not on file   Years of education: Not on file   Highest education level: Not on file  Occupational History   Not on file  Tobacco Use   Smoking status: Never   Smokeless tobacco: Never  Vaping Use   Vaping Use: Never used  Substance and Sexual Activity   Alcohol use: No   Drug use: No   Sexual activity: Not on file  Other Topics Concern   Not on file  Social History Narrative   Not on file   Social Determinants of Health   Financial Resource Strain: Not on file  Food Insecurity: Not on file  Transportation Needs: Not on file  Physical Activity: Not on file  Stress: Not on file  Social Connections: Not on file      Family History:  The patient's family history includes Diabetes type II in his father; Heart attack in his maternal aunt and another family member; Hypertension in his father; Tuberculosis in some other family members.   Review of Systems:    Please see the history of present illness.     All other systems reviewed and are otherwise negative except as noted above.   Physical Exam:    VS:  BP (!) 148/82   Pulse 96   Ht '5\' 6"'$  (1.676 m)   Wt 202 lb 3.2 oz (91.7 kg)   SpO2 96%   BMI 32.64  kg/m    General: Pleasant elderly male appearing in no acute distress. Head: Normocephalic, atraumatic. Neck: No carotid bruits. JVD not elevated.  Lungs: Respirations regular and unlabored, without wheezes or rales.  Heart: Regular rate and rhythm. No S3 or S4.  No murmur, no rubs, or gallops appreciated. Abdomen: Appears non-distended. No obvious abdominal masses. Msk:  Strength and tone appear normal for age. No obvious joint deformities or effusions. Extremities: No clubbing or cyanosis. No pitting edema.  Distal pedal pulses are 2+ bilaterally. Neuro: Alert and oriented X 3. Moves all extremities spontaneously. No focal deficits noted. Psych:  Responds to questions appropriately with a normal affect. Skin: No rashes or lesions noted  Wt Readings from Last 3 Encounters:  05/04/22 202 lb 3.2 oz (91.7 kg)  07/15/21 210 lb (95.3 kg)  05/15/19 199 lb 4.7 oz (90.4 kg)     Studies/Labs Reviewed:   EKG:  EKG is not ordered today.    Recent Labs: No results found for requested labs within last 365 days.   Lipid Panel No results found for: "CHOL", "TRIG", "HDL", "CHOLHDL", "VLDL", "LDLCALC", "LDLDIRECT"  Additional studies/ records that were reviewed today include:   Echocardiogram: 09/2016 Study Conclusions   - Left ventricle: The cavity size was normal. Wall thickness was    increased in a pattern of mild LVH. Systolic function was    vigorous. The estimated ejection fraction was in  the range of 65%    to 70%. Wall motion was normal; there were no regional wall    motion abnormalities. Doppler parameters are consistent with    abnormal left ventricular relaxation (grade 1 diastolic    dysfunction).  - Aortic valve: Mildly calcified annulus. Trileaflet.  - Mitral valve: There was trivial regurgitation.  - Left atrium: The atrium was mildly dilated.  - Right atrium: Central venous pressure (est): 3 mm Hg.  - Atrial septum: No defect or patent foramen ovale was identified.  - Tricuspid valve: There was mild regurgitation.  - Pulmonary arteries: PA peak pressure: 35 mm Hg (S).  - Pericardium, extracardiac: There was no pericardial effusion.   Impressions:   - Mild LVH with sigmoid-shaped basal septum and LVEF 65-70%. Grade    1 diastolic dysfunction. Mild left atrial enlargement. Trivial    mitral regurgitation. Mild calcified aortic annulus. Mild    tricuspid regurgitation with PASP 35 mmHg.    Assessment:    1. Essential hypertension   2. Mixed hyperlipidemia   3. Stage 3a chronic kidney disease ([place])      Plan:   In order of problems listed above:  1. HTN - His BP was initially recorded at 168/80, rechecked and improved to 148/82. He is listed as taking Hydralazine 50 mg 3 times daily but only takes this as twice daily dosing and is unsure if the dose is actually 50 mg or 100 mg. I encouraged him to make sure he is taking 100 mg twice daily if not already doing so and an updated Rx was sent to his pharmacy. Continue Imdur 60 mg daily, Losartan-HCTZ 100-25 mg daily and Verapamil 240 mg daily. If BP remains above goal, would switch HCTZ to Chlorthalidone. He has been on Verapamil instead of Amlodipine due to a history of sinus tachycardia.   2. HLD - Followed by his PCP. We have requested a copy of most recent labs as he reports they were obtained late last year. He remains on Atorvastatin 40 mg daily.  3. Stage 3 CKD -  Followed by his PCP. His creatinine  was variable from 1.2 - 1.6 in 2020. Will request a copy of most recent labs.    Medication Adjustments/Labs and Tests Ordered: Current medicines are reviewed at length with the patient today.  Concerns regarding medicines are outlined above.  Medication changes, Labs and Tests ordered today are listed in the Patient Instructions below. Patient Instructions  Medication Instructions:  Take Hydralazine 100 mg tablets twice daily  Labwork: None  Testing/Procedures: None  Follow-Up: Follow up with Dr. Harl Bowie in 6 months.   Any Other Special Instructions Will Be Listed Below (If Applicable).     If you need a refill on your cardiac medications before your next appointment, please call your pharmacy.    Signed, Erma Heritage, PA-C  05/04/2022 5:42 PM    [facility] [address] [place], [place] [address] Phone: [phone] Fax: [phone]

## 2022-05-04 NOTE — Patient Instructions (Signed)
Medication Instructions:  Take Hydralazine 100 mg tablets twice daily  Labwork: None  Testing/Procedures: None  Follow-Up: Follow up with Dr. Harl Bowie in 6 months.   Any Other Special Instructions Will Be Listed Below (If Applicable).     If you need a refill on your cardiac medications before your next appointment, please call your pharmacy.

## 2022-05-13 DIAGNOSIS — Z1389 Encounter for screening for other disorder: Secondary | ICD-10-CM | POA: Diagnosis not present

## 2022-05-13 DIAGNOSIS — I10 Essential (primary) hypertension: Secondary | ICD-10-CM | POA: Diagnosis not present

## 2022-05-13 DIAGNOSIS — N1831 Chronic kidney disease, stage 3a: Secondary | ICD-10-CM | POA: Diagnosis not present

## 2022-05-13 DIAGNOSIS — Z0001 Encounter for general adult medical examination with abnormal findings: Secondary | ICD-10-CM | POA: Diagnosis not present

## 2022-05-13 DIAGNOSIS — E1122 Type 2 diabetes mellitus with diabetic chronic kidney disease: Secondary | ICD-10-CM | POA: Diagnosis not present

## 2022-05-13 DIAGNOSIS — Z1331 Encounter for screening for depression: Secondary | ICD-10-CM | POA: Diagnosis not present

## 2022-06-12 DIAGNOSIS — I10 Essential (primary) hypertension: Secondary | ICD-10-CM | POA: Diagnosis not present

## 2022-06-12 DIAGNOSIS — E1165 Type 2 diabetes mellitus with hyperglycemia: Secondary | ICD-10-CM | POA: Diagnosis not present

## 2022-07-05 ENCOUNTER — Other Ambulatory Visit: Payer: Self-pay | Admitting: *Deleted

## 2022-07-05 NOTE — Patient Outreach (Signed)
  Care Coordination   07/05/2022  Name: Stephen GAGE Sr. MRN: [id number] DOB: 10/24/1941   Care Coordination Outreach Attempts:  An unsuccessful telephone outreach was attempted today to offer the patient information about available care coordination services as a benefit of their health plan. HIPAA compliant messages left on voicemail, providing contact information for CSW, encouraging patient to return CSW's call at his earliest convenience.  Follow Up Plan:  Additional outreach attempts will be made to offer the patient care coordination information and services.   Encounter Outcome:  No Answer.   Care Coordination Interventions Activated:  No.    Care Coordination Interventions:  No, not indicated.    Nat Christen, BSW, MSW, LCSW  Licensed Education officer, environmental Health System  Mailing [place] [address], [place], [place] [address] Physical Address-[address], [place], [place] [address] Toll Free Main # [phone] Fax # [phone] Cell # [phone] [email or web address]

## 2022-07-06 ENCOUNTER — Other Ambulatory Visit: Payer: Self-pay | Admitting: *Deleted

## 2022-07-06 NOTE — Patient Outreach (Signed)
  Care Coordination   07/06/2022  Name: Stephen BURDO Sr. MRN: [id number] DOB: September 04, 1941   Care Coordination Outreach Attempts:  A second unsuccessful outreach was attempted today to offer the patient with information about available care coordination services as a benefit of their health plan.   HIPAA compliant message left on voicemail, providing contact information for CSW, encouraging patient to return CSW's call at his earliest convenience.  Follow Up Plan:  Additional outreach attempts will be made to offer the patient care coordination information and services.   Encounter Outcome:  No Answer.   Care Coordination Interventions Activated:  No.    Care Coordination Interventions:  No, not indicated.    Nat Christen, BSW, MSW, LCSW  Licensed Education officer, environmental Health System  Mailing [place] [address] [place], [place] [address] Physical Address-[address] [place], [place] [address] Toll Free Main # [phone] Fax # [phone] Cell # [phone] [email or web address]

## 2022-07-13 DIAGNOSIS — E782 Mixed hyperlipidemia: Secondary | ICD-10-CM | POA: Diagnosis not present

## 2022-07-13 DIAGNOSIS — I10 Essential (primary) hypertension: Secondary | ICD-10-CM | POA: Diagnosis not present

## 2022-07-14 ENCOUNTER — Other Ambulatory Visit: Payer: Self-pay | Admitting: *Deleted

## 2022-07-14 NOTE — Patient Outreach (Signed)
  Care Coordination   07/14/2022  Name: JAVARIOUS ELSAYED Sr. MRN: [id number] DOB: 1941-05-14   Care Coordination Outreach Attempts:  A third unsuccessful outreach was attempted today to offer the patient with information about available care coordination services as a benefit of their health plan. HIPAA compliant messages left on voicemail, providing contact information for CSW, encouraging patient to return CSW's call at his earliest convenience.  Follow Up Plan:  No further outreach attempts will be made at this time. We have been unable to contact the patient to offer or enroll patient in care coordination services  Encounter Outcome:  No Answer.   Care Coordination Interventions Activated:  No.    Care Coordination Interventions:  No, not indicated.    Nat Christen, BSW, MSW, LCSW  Licensed Education officer, environmental Health System  Mailing [place] [address] [place], [place] [address] Physical Address-[address], [place], [place] [address] Toll Free Main # [phone] Fax # [phone] Cell # [phone] [email or web address]

## 2022-08-13 DIAGNOSIS — E1165 Type 2 diabetes mellitus with hyperglycemia: Secondary | ICD-10-CM | POA: Diagnosis not present

## 2022-08-13 DIAGNOSIS — I10 Essential (primary) hypertension: Secondary | ICD-10-CM | POA: Diagnosis not present

## 2022-08-17 DIAGNOSIS — N1831 Chronic kidney disease, stage 3a: Secondary | ICD-10-CM | POA: Diagnosis not present

## 2022-08-17 DIAGNOSIS — Z6832 Body mass index (BMI) 32.0-32.9, adult: Secondary | ICD-10-CM | POA: Diagnosis not present

## 2022-08-17 DIAGNOSIS — I272 Pulmonary hypertension, unspecified: Secondary | ICD-10-CM | POA: Diagnosis not present

## 2022-08-17 DIAGNOSIS — E785 Hyperlipidemia, unspecified: Secondary | ICD-10-CM | POA: Diagnosis not present

## 2022-08-17 DIAGNOSIS — I10 Essential (primary) hypertension: Secondary | ICD-10-CM | POA: Diagnosis not present

## 2022-08-17 DIAGNOSIS — E1165 Type 2 diabetes mellitus with hyperglycemia: Secondary | ICD-10-CM | POA: Diagnosis not present

## 2022-08-17 DIAGNOSIS — Z0001 Encounter for general adult medical examination with abnormal findings: Secondary | ICD-10-CM | POA: Diagnosis not present

## 2022-09-12 DIAGNOSIS — I10 Essential (primary) hypertension: Secondary | ICD-10-CM | POA: Diagnosis not present

## 2022-09-12 DIAGNOSIS — E1165 Type 2 diabetes mellitus with hyperglycemia: Secondary | ICD-10-CM | POA: Diagnosis not present

## 2022-10-13 DIAGNOSIS — I10 Essential (primary) hypertension: Secondary | ICD-10-CM | POA: Diagnosis not present

## 2022-10-13 DIAGNOSIS — E1165 Type 2 diabetes mellitus with hyperglycemia: Secondary | ICD-10-CM | POA: Diagnosis not present

## 2022-11-03 ENCOUNTER — Encounter: Payer: Self-pay | Admitting: Cardiology

## 2022-11-03 ENCOUNTER — Ambulatory Visit: Payer: Medicare HMO | Attending: Cardiology | Admitting: Cardiology

## 2022-11-03 VITALS — BP 150/90 | HR 116 | Ht 66.0 in | Wt 205.0 lb

## 2022-11-03 DIAGNOSIS — R011 Cardiac murmur, unspecified: Secondary | ICD-10-CM

## 2022-11-03 DIAGNOSIS — I10 Essential (primary) hypertension: Secondary | ICD-10-CM

## 2022-11-03 DIAGNOSIS — R000 Tachycardia, unspecified: Secondary | ICD-10-CM

## 2022-11-03 MED ORDER — HYDRALAZINE HCL 100 MG PO TABS: 100.0000 mg | ORAL_TABLET | Freq: Three times a day (TID) | ORAL | 3 refills | Status: DC

## 2022-11-03 NOTE — Patient Instructions (Signed)
Medication Instructions:  Your physician has recommended you make the following change in your medication:  Increase Hydralazine to 100 mg tablets 3 times daily.   Labwork: None  Testing/Procedures: Your physician has requested that you have an echocardiogram. Echocardiography is a painless test that uses sound waves to create images of your heart. It provides your doctor with information about the size and shape of your heart and how well your heart's chambers and valves are working. This procedure takes approximately one hour. There are no restrictions for this procedure. Please do NOT wear cologne, perfume, aftershave, or lotions (deodorant is allowed). Please arrive 15 minutes prior to your appointment time.   Follow-Up: Follow up with Dr. Harl Bowie in 6 months.   Any Other Special Instructions Will Be Listed Below (If Applicable).     If you need a refill on your cardiac medications before your next appointment, please call your pharmacy.

## 2022-11-03 NOTE — Progress Notes (Signed)
Clinical Summary [name] is a 81 y.o.male seen today for follow up of the following medical problems.      1. Tachycardia - he reports history of elevated heart rates since he was a teenager.  - EKG review shows chronic mild sinus tach to 109 at least since 2013   - denies any palpitations     2. HTN - does not check at home.  - compliant with meds. On imdur by another provider  - home bp's 140/80 usually at home.  Past Medical History:  Diagnosis Date   CKD (chronic kidney disease), stage III ([place])    Diabetes mellitus    DVT (deep venous thrombosis) ([place]) 10/14/2016   Extremity edema 09/2016   Hyperlipemia    Hypertension      No Known Allergies   Current Outpatient Medications  Medication Sig Dispense Refill   aspirin EC 81 MG tablet Take 81 mg by mouth daily.     atorvastatin (LIPITOR) 40 MG tablet Take 40 mg by mouth daily.     hydrALAZINE (APRESOLINE) 100 MG tablet Take 1 tablet (100 mg total) by mouth 2 (two) times daily. 180 tablet 3   isosorbide mononitrate (IMDUR) 60 MG 24 hr tablet Take 1 tablet (60 mg total) by mouth daily. 90 tablet 3   losartan-hydrochlorothiazide (HYZAAR) 100-25 MG tablet Take 1 tablet by mouth daily.     metFORMIN (GLUCOPHAGE) 500 MG tablet Take 500 mg by mouth 2 (two) times a day.     NON FORMULARY Diet Type:  NAS, Consistent Carbohydrate, bland diet     TRADJENTA 5 MG TABS tablet Take 5 mg by mouth daily.     verapamil (CALAN-SR) 240 MG CR tablet Take 240 mg by mouth daily.     No current facility-administered medications for this visit.     Past Surgical History:  Procedure Laterality Date   CHOLECYSTECTOMY N/A 04/23/2019   Procedure: CHOLECYSTECTOMY;  Surgeon: Aviva Signs, MD;  [facility];  Service: General;  Laterality: N/A;   COLONOSCOPY  02/2010   Dr. [name]: diverticuolosis, multiple polyps removed, cecal adenoma.    COLONOSCOPY WITH PROPOFOL N/A 04/19/2019   Procedure: COLONOSCOPY WITH PROPOFOL;  Surgeon:  [name], MD;  [facility];  Service: Endoscopy;  Laterality: N/A;   IR GENERIC HISTORICAL  10/19/2016   IR PERC CHOLECYSTOSTOMY 10/19/2016 [name], MD [facility]   IR GENERIC HISTORICAL  10/25/2016   IR CATHETER TUBE CHANGE 10/25/2016 [name], MD [facility]   IR GENERIC HISTORICAL  11/30/2016   IR RADIOLOGIST EVAL & MGMT 11/30/2016 [facility]   PARTIAL COLECTOMY N/A 04/23/2019   Procedure: PARTIAL COLECTOMY;  Surgeon: Aviva Signs, MD;  Location[facility];  Service: General;  Laterality: N/A;   POLYPECTOMY  04/19/2019   Procedure: POLYPECTOMY;  Surgeon: [name], MD;  [facility];  Service: Endoscopy;;  polyp at recto sigmoid     No Known Allergies    Family History  Problem Relation Age of Onset   Heart attack Other    Tuberculosis Other    Tuberculosis Other    Diabetes type II Father    Hypertension Father    Heart attack Maternal Aunt      Social History Mr. Wisecup reports that he has never smoked. He has never used smokeless tobacco. Mr. Hevener reports no history of alcohol use.   Review of Systems CONSTITUTIONAL: No weight loss, fever, chills, weakness or  fatigue.  HEENT: Eyes: No visual loss, blurred vision, double vision or yellow sclerae.No hearing loss, sneezing, congestion, runny nose or sore throat.  SKIN: No rash or itching.  CARDIOVASCULAR: no chest pain, no palpitations RESPIRATORY: No shortness of breath, cough or sputum.  GASTROINTESTINAL: No anorexia, nausea, vomiting or diarrhea. No abdominal pain or blood.  GENITOURINARY: No burning on urination, no polyuria NEUROLOGICAL: No headache, dizziness, syncope, paralysis, ataxia, numbness or tingling in the extremities. No change in bowel or bladder control.  MUSCULOSKELETAL: No muscle, back pain, joint pain or stiffness.  LYMPHATICS: No enlarged nodes. No history of splenectomy.  PSYCHIATRIC: No history of depression or anxiety.  ENDOCRINOLOGIC: No  reports of sweating, cold or heat intolerance. No polyuria or polydipsia.  Marland Kitchen   Physical Examination Today's Vitals   11/03/22 0816  BP: (!) 150/90  Pulse: (!) 116  SpO2: 97%  Weight: 205 lb (93 kg)  Height: '5\' 6"'$  (1.676 m)   Body mass index is 33.09 kg/m.  Gen: resting comfortably, no acute distress HEENT: no scleral icterus, pupils equal round and reactive, no palptable cervical adenopathy,  CV: regular, tachy, 3/6 systolic murmur apex, no jvd Resp: Clear to auscultation bilaterally GI: abdomen is soft, non-tender, non-distended, normal bowel sounds, no hepatosplenomegaly MSK: extremities are warm, no edema.  Skin: warm, no rash Neuro:  no focal deficits Psych: appropriate affect   Diagnostic Studies  09/2016 echo Study Conclusions   - Left ventricle: The cavity size was normal. Wall thickness was   increased in a pattern of mild LVH. Systolic function was   vigorous. The estimated ejection fraction was in the range of 65%   to 70%. Wall motion was normal; there were no regional wall   motion abnormalities. Doppler parameters are consistent with   abnormal left ventricular relaxation (grade 1 diastolic   dysfunction). - Aortic valve: Mildly calcified annulus. Trileaflet. - Mitral valve: There was trivial regurgitation. - Left atrium: The atrium was mildly dilated. - Right atrium: Central venous pressure (est): 3 mm Hg. - Atrial septum: No defect or patent foramen ovale was identified. - Tricuspid valve: There was mild regurgitation. - Pulmonary arteries: PA peak pressure: 35 mm Hg (S). - Pericardium, extracardiac: There was no pericardial effusion.   Impressions:   - Mild LVH with sigmoid-shaped basal septum and LVEF 65-70%. Grade   1 diastolic dysfunction. Mild left atrial enlargement. Trivial   mitral regurgitation. Mild calcified aortic annulus. Mild   tricuspid regurgitation with PASP 35 mmHg.   Assessment and Plan    1. Sinus tachycardia - chronic  stable sinus tachycardia well over 10 years, patient himself reports a history of elevated heart rates since he was a teen.  - denies any symptoms, no further workup indicated   2. HTN - multiple med changes since I last saw him in 2019. - bp above goal, will increase hydralazine to '100mg'$  tid - request labs, CKD limits some bp med options.     3.Heart murmur - obtain echo   Arnoldo Lenis, M.D.

## 2022-11-03 NOTE — Addendum Note (Signed)
Addended by: Berlinda Last on: 11/03/2022 09:13 AM   Modules accepted: Orders

## 2022-11-09 ENCOUNTER — Ambulatory Visit (HOSPITAL_COMMUNITY)
Admission: RE | Admit: 2022-11-09 | Discharge: 2022-11-09 | Disposition: A | Discharge status: Home / Self Care | Payer: Medicare HMO | Source: Ambulatory Visit | Attending: Cardiology | Admitting: Cardiology

## 2022-11-09 DIAGNOSIS — R011 Cardiac murmur, unspecified: Secondary | ICD-10-CM | POA: Insufficient documentation

## 2022-11-09 LAB — ECHOCARDIOGRAM COMPLETE
Area-P 1/2: 2.39 cm2
S' Lateral: 2.3 cm

## 2022-11-09 NOTE — Progress Notes (Signed)
*  PRELIMINARY RESULTS* Echocardiogram 2D Echocardiogram has been performed.  Stephen Rogers 11/09/2022, 10:21 AM

## 2022-11-17 DIAGNOSIS — E785 Hyperlipidemia, unspecified: Secondary | ICD-10-CM | POA: Diagnosis not present

## 2022-11-17 DIAGNOSIS — E1165 Type 2 diabetes mellitus with hyperglycemia: Secondary | ICD-10-CM | POA: Diagnosis not present

## 2022-11-17 DIAGNOSIS — N1831 Chronic kidney disease, stage 3a: Secondary | ICD-10-CM | POA: Diagnosis not present

## 2022-11-17 DIAGNOSIS — I10 Essential (primary) hypertension: Secondary | ICD-10-CM | POA: Diagnosis not present

## 2022-12-18 DIAGNOSIS — E1165 Type 2 diabetes mellitus with hyperglycemia: Secondary | ICD-10-CM | POA: Diagnosis not present

## 2022-12-18 DIAGNOSIS — I10 Essential (primary) hypertension: Secondary | ICD-10-CM | POA: Diagnosis not present

## 2023-01-18 DIAGNOSIS — E1165 Type 2 diabetes mellitus with hyperglycemia: Secondary | ICD-10-CM | POA: Diagnosis not present

## 2023-01-18 DIAGNOSIS — I10 Essential (primary) hypertension: Secondary | ICD-10-CM | POA: Diagnosis not present

## 2023-02-16 DIAGNOSIS — E1165 Type 2 diabetes mellitus with hyperglycemia: Secondary | ICD-10-CM | POA: Diagnosis not present

## 2023-02-16 DIAGNOSIS — I10 Essential (primary) hypertension: Secondary | ICD-10-CM | POA: Diagnosis not present

## 2023-03-19 DIAGNOSIS — I10 Essential (primary) hypertension: Secondary | ICD-10-CM | POA: Diagnosis not present

## 2023-03-19 DIAGNOSIS — E1165 Type 2 diabetes mellitus with hyperglycemia: Secondary | ICD-10-CM | POA: Diagnosis not present

## 2023-04-18 DIAGNOSIS — I10 Essential (primary) hypertension: Secondary | ICD-10-CM | POA: Diagnosis not present

## 2023-04-18 DIAGNOSIS — E1165 Type 2 diabetes mellitus with hyperglycemia: Secondary | ICD-10-CM | POA: Diagnosis not present

## 2023-04-28 DIAGNOSIS — I10 Essential (primary) hypertension: Secondary | ICD-10-CM | POA: Diagnosis not present

## 2023-04-28 DIAGNOSIS — N1831 Chronic kidney disease, stage 3a: Secondary | ICD-10-CM | POA: Diagnosis not present

## 2023-04-28 DIAGNOSIS — Z1331 Encounter for screening for depression: Secondary | ICD-10-CM | POA: Diagnosis not present

## 2023-04-28 DIAGNOSIS — Z1389 Encounter for screening for other disorder: Secondary | ICD-10-CM | POA: Diagnosis not present

## 2023-04-28 DIAGNOSIS — E1165 Type 2 diabetes mellitus with hyperglycemia: Secondary | ICD-10-CM | POA: Diagnosis not present

## 2023-04-28 DIAGNOSIS — E785 Hyperlipidemia, unspecified: Secondary | ICD-10-CM | POA: Diagnosis not present

## 2023-04-28 DIAGNOSIS — Z0001 Encounter for general adult medical examination with abnormal findings: Secondary | ICD-10-CM | POA: Diagnosis not present

## 2023-05-18 ENCOUNTER — Ambulatory Visit: Payer: Medicare HMO | Attending: Cardiology | Admitting: Cardiology

## 2023-05-18 ENCOUNTER — Encounter: Payer: Self-pay | Admitting: Cardiology

## 2023-05-18 VITALS — BP 140/70 | HR 110 | Ht 66.0 in | Wt 202.6 lb

## 2023-05-18 DIAGNOSIS — R000 Tachycardia, unspecified: Secondary | ICD-10-CM

## 2023-05-18 DIAGNOSIS — E782 Mixed hyperlipidemia: Secondary | ICD-10-CM | POA: Diagnosis not present

## 2023-05-18 DIAGNOSIS — I10 Essential (primary) hypertension: Secondary | ICD-10-CM | POA: Diagnosis not present

## 2023-05-18 MED ORDER — SPIRONOLACTONE 25 MG PO TABS: 12.5000 mg | ORAL_TABLET | Freq: Every day | ORAL | 3 refills | Status: DC

## 2023-05-18 NOTE — Patient Instructions (Signed)
Medication Instructions:  Your physician recommends that you continue on your current medications as directed. Please refer to the Current Medication list given to you today.  *If you need a refill on your cardiac medications before your next appointment, please call your pharmacy*   Lab Work: None If you have labs (blood work) drawn today and your tests are completely normal, you will receive your results only by: MyChart Message (if you have MyChart) OR A paper copy in the mail If you have any lab test that is abnormal or we need to change your treatment, we will call you to review the results.   Testing/Procedures: None   Follow-Up: At  [facility], you and your health needs are our priority.  As part of our continuing mission to provide you with exceptional heart care, we have created designated Provider Care Teams.  These Care Teams include your primary Cardiologist (physician) and Advanced Practice Providers (APPs -  Physician Assistants and Nurse Practitioners) who all work together to provide you with the care you need, when you need it.  We recommend signing up for the patient portal called "MyChart".  Sign up information is provided on this After Visit Summary.  MyChart is used to connect with patients for Virtual Visits (Telemedicine).  Patients are able to view lab/test results, encounter notes, upcoming appointments, etc.  Non-urgent messages can be sent to your provider as well.   To learn more about what you can do with MyChart, go to [email or web address].    Your next appointment:   6 month(s)  Provider:   Jonathan Branch, MD    Other Instructions    

## 2023-05-18 NOTE — Addendum Note (Signed)
Addended by: Roseanne Reno on: 05/18/2023 01:52 PM   Modules accepted: Orders

## 2023-05-18 NOTE — Progress Notes (Signed)
Clinical Summary Mr. Baze is a 82 y.o.male seen today for follow up of the following medical problems.      1. Tachycardia - he reports history of elevated heart rates since he was a teenager.  - EKG review shows chronic mild sinus tach to 109 at least since 2013   - can have some palpitations, very seldom.      2. HTN - On imdur by another provider   - home bp's typically 140s/80s   3. Heart murmur - 10/2022 echo no significant valve abnormality, did have some basal septal hypertrophy with flow acceleration of the LVOT  4. DM2 - followed by pcp  5. Hyperliipdemia - labs followed by pcp  Past Medical History:  Diagnosis Date   CKD (chronic kidney disease), stage III (HCC)    Diabetes mellitus    DVT (deep venous thrombosis) (HCC) 10/14/2016   Extremity edema 09/2016   Hyperlipemia    Hypertension      No Known Allergies   Current Outpatient Medications  Medication Sig Dispense Refill   aspirin EC 81 MG tablet Take 81 mg by mouth daily.     atorvastatin (LIPITOR) 40 MG tablet Take 40 mg by mouth daily.     hydrALAZINE (APRESOLINE) 100 MG tablet Take 1 tablet (100 mg total) by mouth 3 (three) times daily. 270 tablet 3   isosorbide mononitrate (IMDUR) 60 MG 24 hr tablet Take 1 tablet (60 mg total) by mouth daily. 90 tablet 3   losartan-hydrochlorothiazide (HYZAAR) 100-25 MG tablet Take 1 tablet by mouth daily.     metFORMIN (GLUCOPHAGE) 500 MG tablet Take 500 mg by mouth 2 (two) times a day.     NON FORMULARY Diet Type:  NAS, Consistent Carbohydrate, bland diet     TRADJENTA 5 MG TABS tablet Take 5 mg by mouth daily.     verapamil (CALAN-SR) 240 MG CR tablet Take 240 mg by mouth daily.     No current facility-administered medications for this visit.     Past Surgical History:  Procedure Laterality Date   CHOLECYSTECTOMY N/A 04/23/2019   Procedure: CHOLECYSTECTOMY;  Surgeon: [name], MD;  Location: [facility];  Service: General;  Laterality: N/A;    COLONOSCOPY  02/2010   Dr. [name]: diverticuolosis, multiple polyps removed, cecal adenoma.    COLONOSCOPY WITH PROPOFOL N/A 04/19/2019   Procedure: COLONOSCOPY WITH PROPOFOL;  Surgeon: [name], MD;  Location: [facility];  Service: Endoscopy;  Laterality: N/A;   IR GENERIC HISTORICAL  10/19/2016   IR PERC CHOLECYSTOSTOMY 10/19/2016 [name], MD [facility]   IR GENERIC HISTORICAL  10/25/2016   IR CATHETER TUBE CHANGE 10/25/2016 [name], MD [facility]   IR GENERIC HISTORICAL  11/30/2016   IR RADIOLOGIST EVAL & MGMT 11/30/2016 [facility]   PARTIAL COLECTOMY N/A 04/23/2019   Procedure: PARTIAL COLECTOMY;  Surgeon: [name], MD;  Location: [facility];  Service: General;  Laterality: N/A;   POLYPECTOMY  04/19/2019   Procedure: POLYPECTOMY;  Surgeon: [name], MD;  Location: [facility];  Service: Endoscopy;;  polyp at recto sigmoid     No Known Allergies    Family History  Problem Relation Age of Onset   Heart attack Other    Tuberculosis Other    Tuberculosis Other    Diabetes type II Father    Hypertension Father    Heart attack Maternal Aunt      Social History Mr. Vanderwerf reports  that he has never smoked. He has never used smokeless tobacco. Mr. Spease reports no history of alcohol use.   Review of Systems CONSTITUTIONAL: No weight loss, fever, chills, weakness or fatigue.  HEENT: Eyes: No visual loss, blurred vision, double vision or yellow sclerae.No hearing loss, sneezing, congestion, runny nose or sore throat.  SKIN: No rash or itching.  CARDIOVASCULAR: per hpi RESPIRATORY: No shortness of breath, cough or sputum.  GASTROINTESTINAL: No anorexia, nausea, vomiting or diarrhea. No abdominal pain or blood.  GENITOURINARY: No burning on urination, no polyuria NEUROLOGICAL: No headache, dizziness, syncope, paralysis, ataxia, numbness or tingling in the extremities. No change in bowel or bladder control.  MUSCULOSKELETAL: No muscle, back  pain, joint pain or stiffness.  LYMPHATICS: No enlarged nodes. No history of splenectomy.  PSYCHIATRIC: No history of depression or anxiety.  ENDOCRINOLOGIC: No reports of sweating, cold or heat intolerance. No polyuria or polydipsia.  Marland Kitchen   Physical Examination Today's Vitals   05/18/23 1002  BP: (!) 143/68  Pulse: (!) 110  SpO2: 96%  Weight: 202 lb 9.6 oz (91.9 kg)  Height: 5\' 6"  (1.676 m)   Body mass index is 32.7 kg/m.  Gen: resting comfortably, no acute distress HEENT: no scleral icterus, pupils equal round and reactive, no palptable cervical adenopathy,  CV: RRR, 2/6 systolic murmur rusb, no jvd Resp: Clear to auscultation bilaterally GI: abdomen is soft, non-tender, non-distended, normal bowel sounds, no hepatosplenomegaly MSK: extremities are warm, no edema.  Skin: warm, no rash Neuro:  no focal deficits Psych: appropriate affect   Diagnostic Studies  09/2016 echo Study Conclusions   - Left ventricle: The cavity size was normal. Wall thickness was   increased in a pattern of mild LVH. Systolic function was   vigorous. The estimated ejection fraction was in the range of 65%   to 70%. Wall motion was normal; there were no regional wall   motion abnormalities. Doppler parameters are consistent with   abnormal left ventricular relaxation (grade 1 diastolic   dysfunction). - Aortic valve: Mildly calcified annulus. Trileaflet. - Mitral valve: There was trivial regurgitation. - Left atrium: The atrium was mildly dilated. - Right atrium: Central venous pressure (est): 3 mm Hg. - Atrial septum: No defect or patent foramen ovale was identified. - Tricuspid valve: There was mild regurgitation. - Pulmonary arteries: PA peak pressure: 35 mm Hg (S). - Pericardium, extracardiac: There was no pericardial effusion.   Impressions:   - Mild LVH with sigmoid-shaped basal septum and LVEF 65-70%. Grade   1 diastolic dysfunction. Mild left atrial enlargement. Trivial   mitral  regurgitation. Mild calcified aortic annulus. Mild   tricuspid regurgitation with PASP 35 mmHg.   10/2022 echo IMPRESSIONS     1. Left ventricular ejection fraction, by estimation, is 60 to 65%. The  left ventricle has normal function. Left ventricular endocardial border  not optimally defined to evaluate regional wall motion. There is moderate  asymmetric left ventricular  hypertrophy of the basal segment. Left ventricular diastolic parameters  are consistent with Grade I diastolic dysfunction (impaired relaxation).  There is flow accerelation in the left ventricular outflow tract likely  secondary to basal septal  hypertrophy.   2. Right ventricular systolic function is normal. The right ventricular  size is normal. There is normal pulmonary artery systolic pressure.   3. Left atrial size was mildly dilated.   4. The mitral valve is grossly normal. No evidence of mitral valve  regurgitation. No evidence of mitral stenosis.   5.  The aortic valve is tricuspid. Aortic valve regurgitation not  assessed. No aortic stenosis is present.  Assessment and Plan   1. Sinus tachycardia - chronic stable sinus tachycardia well over 10 years, patient himself reports a history of elevated heart rates since he was a teen.  - continue to monitor   2. HTN - remains above goal. Requesting labs from pcp, pending results consider changing hydrochlorothiazide to chlorthalidone or perhaps adding aldactone     3.Hyperlipidemia - request labs from pcp   F/u 6 months.   Addendum: 07/2022 Cr 1.41, GR 50. Start aldactone 12.5mg  daily for bp   [name], M.D.

## 2023-05-28 DIAGNOSIS — I10 Essential (primary) hypertension: Secondary | ICD-10-CM | POA: Diagnosis not present

## 2023-05-28 DIAGNOSIS — E1165 Type 2 diabetes mellitus with hyperglycemia: Secondary | ICD-10-CM | POA: Diagnosis not present

## 2023-06-13 DIAGNOSIS — I10 Essential (primary) hypertension: Secondary | ICD-10-CM | POA: Diagnosis not present

## 2023-06-13 DIAGNOSIS — Z0001 Encounter for general adult medical examination with abnormal findings: Secondary | ICD-10-CM | POA: Diagnosis not present

## 2023-06-13 DIAGNOSIS — E1165 Type 2 diabetes mellitus with hyperglycemia: Secondary | ICD-10-CM | POA: Diagnosis not present

## 2023-06-20 ENCOUNTER — Inpatient Hospital Stay (HOSPITAL_COMMUNITY)
Admission: EM | Admit: 2023-06-20 | Discharge: 2023-06-23 | DRG: 175 | Disposition: A | Discharge status: Home Health | Payer: Medicare HMO | Attending: Family Medicine | Admitting: Family Medicine

## 2023-06-20 ENCOUNTER — Emergency Department (HOSPITAL_COMMUNITY): Payer: Medicare HMO

## 2023-06-20 ENCOUNTER — Encounter (HOSPITAL_COMMUNITY): Payer: Self-pay | Admitting: Emergency Medicine

## 2023-06-20 ENCOUNTER — Other Ambulatory Visit: Payer: Self-pay

## 2023-06-20 DIAGNOSIS — E1165 Type 2 diabetes mellitus with hyperglycemia: Secondary | ICD-10-CM | POA: Insufficient documentation

## 2023-06-20 DIAGNOSIS — I10 Essential (primary) hypertension: Secondary | ICD-10-CM | POA: Diagnosis not present

## 2023-06-20 DIAGNOSIS — Z7982 Long term (current) use of aspirin: Secondary | ICD-10-CM | POA: Diagnosis not present

## 2023-06-20 DIAGNOSIS — R55 Syncope and collapse: Secondary | ICD-10-CM | POA: Diagnosis not present

## 2023-06-20 DIAGNOSIS — Z1152 Encounter for screening for COVID-19: Secondary | ICD-10-CM | POA: Diagnosis not present

## 2023-06-20 DIAGNOSIS — E669 Obesity, unspecified: Secondary | ICD-10-CM | POA: Insufficient documentation

## 2023-06-20 DIAGNOSIS — R0902 Hypoxemia: Secondary | ICD-10-CM | POA: Diagnosis not present

## 2023-06-20 DIAGNOSIS — R000 Tachycardia, unspecified: Secondary | ICD-10-CM | POA: Diagnosis not present

## 2023-06-20 DIAGNOSIS — Z9049 Acquired absence of other specified parts of digestive tract: Secondary | ICD-10-CM | POA: Diagnosis not present

## 2023-06-20 DIAGNOSIS — Z7984 Long term (current) use of oral hypoglycemic drugs: Secondary | ICD-10-CM | POA: Diagnosis not present

## 2023-06-20 DIAGNOSIS — R600 Localized edema: Secondary | ICD-10-CM | POA: Diagnosis not present

## 2023-06-20 DIAGNOSIS — R2689 Other abnormalities of gait and mobility: Secondary | ICD-10-CM | POA: Diagnosis not present

## 2023-06-20 DIAGNOSIS — E1122 Type 2 diabetes mellitus with diabetic chronic kidney disease: Secondary | ICD-10-CM | POA: Diagnosis present

## 2023-06-20 DIAGNOSIS — I2609 Other pulmonary embolism with acute cor pulmonale: Secondary | ICD-10-CM | POA: Diagnosis not present

## 2023-06-20 DIAGNOSIS — E46 Unspecified protein-calorie malnutrition: Secondary | ICD-10-CM

## 2023-06-20 DIAGNOSIS — N1831 Chronic kidney disease, stage 3a: Secondary | ICD-10-CM | POA: Diagnosis not present

## 2023-06-20 DIAGNOSIS — Z79899 Other long term (current) drug therapy: Secondary | ICD-10-CM | POA: Diagnosis not present

## 2023-06-20 DIAGNOSIS — Z6832 Body mass index (BMI) 32.0-32.9, adult: Secondary | ICD-10-CM | POA: Diagnosis not present

## 2023-06-20 DIAGNOSIS — E8809 Other disorders of plasma-protein metabolism, not elsewhere classified: Secondary | ICD-10-CM | POA: Diagnosis not present

## 2023-06-20 DIAGNOSIS — Z794 Long term (current) use of insulin: Secondary | ICD-10-CM

## 2023-06-20 DIAGNOSIS — I2699 Other pulmonary embolism without acute cor pulmonale: Secondary | ICD-10-CM | POA: Diagnosis not present

## 2023-06-20 DIAGNOSIS — R0602 Shortness of breath: Secondary | ICD-10-CM | POA: Diagnosis not present

## 2023-06-20 DIAGNOSIS — Z8249 Family history of ischemic heart disease and other diseases of the circulatory system: Secondary | ICD-10-CM | POA: Diagnosis not present

## 2023-06-20 DIAGNOSIS — G9341 Metabolic encephalopathy: Secondary | ICD-10-CM | POA: Diagnosis present

## 2023-06-20 DIAGNOSIS — N390 Urinary tract infection, site not specified: Secondary | ICD-10-CM | POA: Insufficient documentation

## 2023-06-20 DIAGNOSIS — I129 Hypertensive chronic kidney disease with stage 1 through stage 4 chronic kidney disease, or unspecified chronic kidney disease: Secondary | ICD-10-CM | POA: Diagnosis not present

## 2023-06-20 DIAGNOSIS — Z86711 Personal history of pulmonary embolism: Secondary | ICD-10-CM | POA: Diagnosis not present

## 2023-06-20 DIAGNOSIS — N183 Chronic kidney disease, stage 3 unspecified: Secondary | ICD-10-CM | POA: Diagnosis present

## 2023-06-20 DIAGNOSIS — R5381 Other malaise: Secondary | ICD-10-CM | POA: Diagnosis not present

## 2023-06-20 DIAGNOSIS — E782 Mixed hyperlipidemia: Secondary | ICD-10-CM | POA: Diagnosis not present

## 2023-06-20 DIAGNOSIS — Z7901 Long term (current) use of anticoagulants: Secondary | ICD-10-CM | POA: Diagnosis not present

## 2023-06-20 DIAGNOSIS — E441 Mild protein-calorie malnutrition: Secondary | ICD-10-CM | POA: Diagnosis not present

## 2023-06-20 DIAGNOSIS — Z86010 Personal history of colonic polyps: Secondary | ICD-10-CM

## 2023-06-20 DIAGNOSIS — R0989 Other specified symptoms and signs involving the circulatory and respiratory systems: Secondary | ICD-10-CM | POA: Diagnosis not present

## 2023-06-20 DIAGNOSIS — E872 Acidosis, unspecified: Secondary | ICD-10-CM | POA: Diagnosis not present

## 2023-06-20 DIAGNOSIS — R9431 Abnormal electrocardiogram [ECG] [EKG]: Secondary | ICD-10-CM | POA: Diagnosis not present

## 2023-06-20 DIAGNOSIS — I824Z3 Acute embolism and thrombosis of unspecified deep veins of distal lower extremity, bilateral: Secondary | ICD-10-CM | POA: Diagnosis not present

## 2023-06-20 DIAGNOSIS — Z86718 Personal history of other venous thrombosis and embolism: Secondary | ICD-10-CM | POA: Diagnosis not present

## 2023-06-20 DIAGNOSIS — I82443 Acute embolism and thrombosis of tibial vein, bilateral: Secondary | ICD-10-CM | POA: Diagnosis not present

## 2023-06-20 DIAGNOSIS — Z833 Family history of diabetes mellitus: Secondary | ICD-10-CM | POA: Diagnosis not present

## 2023-06-20 DIAGNOSIS — N179 Acute kidney failure, unspecified: Secondary | ICD-10-CM | POA: Diagnosis not present

## 2023-06-20 DIAGNOSIS — I951 Orthostatic hypotension: Secondary | ICD-10-CM | POA: Diagnosis not present

## 2023-06-20 DIAGNOSIS — R531 Weakness: Secondary | ICD-10-CM | POA: Diagnosis not present

## 2023-06-20 DIAGNOSIS — R944 Abnormal results of kidney function studies: Secondary | ICD-10-CM | POA: Diagnosis not present

## 2023-06-20 DIAGNOSIS — I700 Atherosclerosis of aorta: Secondary | ICD-10-CM | POA: Diagnosis not present

## 2023-06-20 DIAGNOSIS — R2681 Unsteadiness on feet: Secondary | ICD-10-CM | POA: Diagnosis not present

## 2023-06-20 DIAGNOSIS — R413 Other amnesia: Secondary | ICD-10-CM | POA: Diagnosis not present

## 2023-06-20 DIAGNOSIS — I959 Hypotension, unspecified: Secondary | ICD-10-CM | POA: Diagnosis not present

## 2023-06-20 LAB — PROTIME-INR
INR: 1 (ref 0.8–1.2)
Prothrombin Time: 13.5 seconds (ref 11.4–15.2)

## 2023-06-20 LAB — URINALYSIS, W/ REFLEX TO CULTURE (INFECTION SUSPECTED)
Bilirubin Urine: NEGATIVE
Glucose, UA: NEGATIVE mg/dL
Hgb urine dipstick: NEGATIVE
Ketones, ur: NEGATIVE mg/dL
Leukocytes,Ua: NEGATIVE
Nitrite: NEGATIVE
Protein, ur: NEGATIVE mg/dL
Specific Gravity, Urine: 1.014 (ref 1.005–1.030)
pH: 7 (ref 5.0–8.0)

## 2023-06-20 LAB — COMPREHENSIVE METABOLIC PANEL
ALT: 27 U/L (ref 0–44)
AST: 32 U/L (ref 15–41)
Albumin: 3.4 g/dL — ABNORMAL LOW (ref 3.5–5.0)
Alkaline Phosphatase: 87 U/L (ref 38–126)
Anion gap: 11 (ref 5–15)
BUN: 23 mg/dL (ref 8–23)
CO2: 21 mmol/L — ABNORMAL LOW (ref 22–32)
Calcium: 8.7 mg/dL — ABNORMAL LOW (ref 8.9–10.3)
Chloride: 103 mmol/L (ref 98–111)
Creatinine, Ser: 1.61 mg/dL — ABNORMAL HIGH (ref 0.61–1.24)
GFR, Estimated: 42 mL/min — ABNORMAL LOW (ref >60)
Glucose, Bld: 193 mg/dL — ABNORMAL HIGH (ref 70–99)
Potassium: 4.2 mmol/L (ref 3.5–5.1)
Sodium: 135 mmol/L (ref 135–145)
Total Bilirubin: 0.9 mg/dL (ref 0.3–1.2)
Total Protein: 6.9 g/dL (ref 6.5–8.1)

## 2023-06-20 LAB — CBC WITH DIFFERENTIAL/PLATELET
Abs Immature Granulocytes: 0.04 10*3/uL (ref 0.00–0.07)
Basophils Absolute: 0.1 10*3/uL (ref 0.0–0.1)
Basophils Relative: 0 %
Eosinophils Absolute: 0.1 10*3/uL (ref 0.0–0.5)
Eosinophils Relative: 1 %
HCT: 39.5 % (ref 39.0–52.0)
Hemoglobin: 13.1 g/dL (ref 13.0–17.0)
Immature Granulocytes: 0 %
Lymphocytes Relative: 41 %
Lymphs Abs: 4.7 10*3/uL — ABNORMAL HIGH (ref 0.7–4.0)
MCH: 30.5 pg (ref 26.0–34.0)
MCHC: 33.2 g/dL (ref 30.0–36.0)
MCV: 91.9 fL (ref 80.0–100.0)
Monocytes Absolute: 0.9 10*3/uL (ref 0.1–1.0)
Monocytes Relative: 8 %
Neutro Abs: 5.6 10*3/uL (ref 1.7–7.7)
Neutrophils Relative %: 50 %
Platelets: 247 10*3/uL (ref 150–400)
RBC: 4.3 MIL/uL (ref 4.22–5.81)
RDW: 13.9 % (ref 11.5–15.5)
WBC: 11.4 10*3/uL — ABNORMAL HIGH (ref 4.0–10.5)
nRBC: 0 % (ref 0.0–0.2)

## 2023-06-20 LAB — APTT: aPTT: 23 seconds — ABNORMAL LOW (ref 24–36)

## 2023-06-20 LAB — LACTIC ACID, PLASMA
Lactic Acid, Venous: 3.9 mmol/L (ref 0.5–1.9)
Lactic Acid, Venous: 2.4 mmol/L (ref 0.5–1.9)
Lactic Acid, Venous: 1.8 mmol/L (ref 0.5–1.9)

## 2023-06-20 LAB — RESP PANEL BY RT-PCR (RSV, FLU A&B, COVID)  RVPGX2
Influenza A by PCR: NEGATIVE
Influenza B by PCR: NEGATIVE
Resp Syncytial Virus by PCR: NEGATIVE
SARS Coronavirus 2 by RT PCR: NEGATIVE

## 2023-06-20 LAB — GLUCOSE, CAPILLARY: Glucose-Capillary: 150 mg/dL — ABNORMAL HIGH (ref 70–99)

## 2023-06-20 LAB — CULTURE, BLOOD (ROUTINE X 2): Special Requests: ADEQUATE

## 2023-06-20 MED ORDER — SODIUM CHLORIDE 0.9 % IV BOLUS
2000.0000 mL | Freq: Once | INTRAVENOUS | Status: AC
  Administered 2023-06-20: 2000 mL via INTRAVENOUS

## 2023-06-20 MED ORDER — GLUCERNA SHAKE PO LIQD
237.0000 mL | Freq: Three times a day (TID) | ORAL | Status: DC
  Administered 2023-06-21 – 2023-06-23 (×6): 237 mL via ORAL

## 2023-06-20 MED ORDER — LOSARTAN POTASSIUM 50 MG PO TABS
100.0000 mg | ORAL_TABLET | Freq: Every day | ORAL | Status: DC
  Administered 2023-06-21 – 2023-06-23 (×3): 100 mg via ORAL
  Filled 2023-06-20 (×3): qty 2

## 2023-06-20 MED ORDER — ATORVASTATIN CALCIUM 40 MG PO TABS
40.0000 mg | ORAL_TABLET | Freq: Every day | ORAL | Status: DC
  Administered 2023-06-21 – 2023-06-23 (×3): 40 mg via ORAL
  Filled 2023-06-20 (×3): qty 1

## 2023-06-20 MED ORDER — SODIUM CHLORIDE 0.9 % IV BOLUS
1000.0000 mL | Freq: Once | INTRAVENOUS | Status: AC
  Administered 2023-06-20: 1000 mL via INTRAVENOUS

## 2023-06-20 MED ORDER — ONDANSETRON HCL 4 MG PO TABS: 4.0000 mg | ORAL_TABLET | Freq: Four times a day (QID) | ORAL | Status: DC | PRN

## 2023-06-20 MED ORDER — IOHEXOL 300 MG/ML  SOLN
60.0000 mL | Freq: Once | INTRAMUSCULAR | Status: AC | PRN
  Administered 2023-06-20: 60 mL via INTRAVENOUS

## 2023-06-20 MED ORDER — ACETAMINOPHEN 650 MG RE SUPP: 650.0000 mg | Freq: Four times a day (QID) | RECTAL | Status: DC | PRN

## 2023-06-20 MED ORDER — ONDANSETRON HCL 4 MG/2ML IJ SOLN: 4.0000 mg | Freq: Four times a day (QID) | INTRAMUSCULAR | Status: DC | PRN

## 2023-06-20 MED ORDER — SODIUM CHLORIDE 0.9 % IV SOLN
2.0000 g | INTRAVENOUS | Status: DC
  Administered 2023-06-20: 2 g via INTRAVENOUS
  Filled 2023-06-20: qty 20

## 2023-06-20 MED ORDER — ISOSORBIDE MONONITRATE ER 60 MG PO TB24
60.0000 mg | ORAL_TABLET | Freq: Every day | ORAL | Status: DC
  Administered 2023-06-21 – 2023-06-22 (×2): 60 mg via ORAL
  Filled 2023-06-20 (×2): qty 1

## 2023-06-20 MED ORDER — HYDRALAZINE HCL 25 MG PO TABS
100.0000 mg | ORAL_TABLET | Freq: Three times a day (TID) | ORAL | Status: DC
  Administered 2023-06-21 – 2023-06-22 (×4): 100 mg via ORAL
  Filled 2023-06-20 (×5): qty 4

## 2023-06-20 MED ORDER — HEPARIN BOLUS VIA INFUSION
5000.0000 [IU] | Freq: Once | INTRAVENOUS | Status: AC
  Administered 2023-06-20: 5000 [IU] via INTRAVENOUS

## 2023-06-20 MED ORDER — HEPARIN (PORCINE) 25000 UT/250ML-% IV SOLN
1400.0000 [IU]/h | INTRAVENOUS | Status: DC
  Administered 2023-06-20: 1400 [IU]/h via INTRAVENOUS
  Filled 2023-06-20: qty 250

## 2023-06-20 MED ORDER — ACETAMINOPHEN 325 MG PO TABS: 650.0000 mg | ORAL_TABLET | Freq: Four times a day (QID) | ORAL | Status: DC | PRN

## 2023-06-20 MED ORDER — INSULIN ASPART 100 UNIT/ML IJ SOLN
0.0000 [IU] | Freq: Three times a day (TID) | INTRAMUSCULAR | Status: DC
  Administered 2023-06-21 (×2): 2 [IU] via SUBCUTANEOUS
  Administered 2023-06-21 – 2023-06-22 (×2): 1 [IU] via SUBCUTANEOUS
  Administered 2023-06-22: 5 [IU] via SUBCUTANEOUS
  Administered 2023-06-22 – 2023-06-23 (×2): 1 [IU] via SUBCUTANEOUS

## 2023-06-20 NOTE — ED Notes (Signed)
Antibiotics delayed due to wait time for 2nd set of blood cultures

## 2023-06-20 NOTE — Progress Notes (Signed)
ANTICOAGULATION CONSULT NOTE - Initial Consult  Pharmacy Consult for heparin Indication: new pulmonary embolus  No Known Allergies  Patient Measurements: Height: 5\' 6"  (167.6 cm) Weight: 91.9 kg (202 lb 9.6 oz) IBW/kg (Calculated) : 63.8 Heparin Dosing Weight: 83.4 kg  Vital Signs: Temp: 100 F (37.8 C) (07/29 1715) Temp Source: Oral (07/29 1715) BP: 115/66 (07/29 2100) Pulse Rate: 90 (07/29 2100)  Labs: Recent Labs    06/20/23 1725  HGB 13.1  HCT 39.5  PLT 247  APTT 23*  LABPROT 13.5  INR 1.0  CREATININE 1.61*    Estimated Creatinine Clearance: 36.9 mL/min (A) (by C-G formula based on SCr of 1.61 mg/dL (H)).   Medical History: Past Medical History:  Diagnosis Date   CKD (chronic kidney disease), stage III (HCC)    Diabetes mellitus    DVT (deep venous thrombosis) (HCC) 10/14/2016   Extremity edema 09/2016   Hyperlipemia    Hypertension     Assessment: Patient is an 83 yom who presented to ED with generalized weakness and appearing more altered per the family. CT with concern for PE, no heart strain noted. Pharmacy has been consulted for heparin dosing. Pt is tachycardic with HR >100, satting 81-96% on 2L [place], on room air before.   No PTA anticoagulants documented, limited medical history in chart but is stated to have had a DVT in 2017. Baseline hgb is wnl at 13.1, plts wnl at 247.   Goal of Therapy:  Heparin level 0.3-0.7 units/ml Monitor platelets by anticoagulation protocol: Yes   Plan:  Give 5000 units bolus x 1 Start heparin infusion at 1400 units/hr Check anti-Xa level in 8 hours (can check with morning labs) and then daily while on heparin Will check an aPTT in AM as well just to ensure patient's HL isn't impacted by a DOAC that we don't have him documented as being on  Continue to monitor H&H and platelets  Fara Olden, PharmD, BCPS, BCOP Clinical Pharmacist 06/20/2023,9:34 PM

## 2023-06-20 NOTE — ED Triage Notes (Signed)
Pt c/o generalized weakess and altered more per family for a couple of days.

## 2023-06-20 NOTE — ED Notes (Signed)
Patient transported to CT 

## 2023-06-20 NOTE — H&P (Signed)
History and Physical    Patient: Stephen Rogers Sr. WUJ:[id number] DOB: [birth year] DOA: 06/20/2023 DOS: the patient was seen and examined on 06/21/2023 PCP: [name], MD  Patient coming from: Home  Chief Complaint:  Chief Complaint  Patient presents with   Altered Mental Status   HPI: Stephen CANNADA Sr. is a 82 y.o. male with medical history significant of T2DM, hypertension, hyperlipidemia, CKD stage III who presents to the emergency department due to several days of generalized weakness and altered mental status.  Patient was unable to provide history at bedside, history was obtained from ED physician and ED medical record.  Per report, mother was concerned due to change in patient's baseline status, EMS was activated and patient was taken to the ED for further evaluation and management.  ED Course:  In the emergency department, temperature was 100 F, pulse was 125 bpm, respiratory rate 20/min, BP 129/68, O2 sat was 92% on room air.  Patient became hypoxic to 87% while in the ED and supplemental oxygen was provided with improvement in O2 sat to 95 to 98%.  Workup in the ED was normal except for WBC of 11.4.  BMP was normal except for blood glucose of 193, bicarb 21, creatinine 1.61 (creatinine appears to be within baseline range).  Albumin 3.4.  Lactic acid 3.9 > 2.4, urinalysis was normal. CT chest with contrast showed findings concerning for possible pulmonary emboli in the right upper and lower lobes. CT head without contrast showed no acute intracranial process Chest x-ray showed low lung volumes Patient was initially treated with IV ceftriaxone due to suspicion for sepsis due to presumed UTI.  Heparin drip was started on patient, IV hydration was provided.  Hospitalist was asked admit patient for further evaluation and management.  Review of Systems: Review of systems as noted in the HPI. All other systems reviewed and are negative.   Past Medical History:   Diagnosis Date   CKD (chronic kidney disease), stage III (HCC)    Diabetes mellitus    DVT (deep venous thrombosis) (HCC) 10/14/2016   Extremity edema 09/2016   Hyperlipemia    Hypertension    Past Surgical History:  Procedure Laterality Date   CHOLECYSTECTOMY N/A 04/23/2019   Procedure: CHOLECYSTECTOMY;  Surgeon: [name], MD;  Location: [facility];  Service: General;  Laterality: N/A;   COLONOSCOPY  02/2010   Dr. [name]: diverticuolosis, multiple polyps removed, cecal adenoma.    COLONOSCOPY WITH PROPOFOL N/A 04/19/2019   Procedure: COLONOSCOPY WITH PROPOFOL;  Surgeon: [name], MD;  [facility];  Service: Endoscopy;  Laterality: N/A;   IR GENERIC HISTORICAL  10/19/2016   IR PERC CHOLECYSTOSTOMY 10/19/2016 [name], MD [facility]   IR GENERIC HISTORICAL  10/25/2016   IR CATHETER TUBE CHANGE 10/25/2016 [name], MD [facility]   IR GENERIC HISTORICAL  11/30/2016   IR RADIOLOGIST EVAL & MGMT 11/30/2016 [facility]   PARTIAL COLECTOMY N/A 04/23/2019   Procedure: PARTIAL COLECTOMY;  Surgeon: [name], MD;  Location: [facility];  Service: General;  Laterality: N/A;   POLYPECTOMY  04/19/2019   Procedure: POLYPECTOMY;  Surgeon: [name], MD;  [facility];  Service: Endoscopy;;  polyp at recto sigmoid    Social History:  reports that he has never smoked. He has never used smokeless tobacco. He reports that he does not drink alcohol and does not use drugs.   No Known Allergies  Family History  Problem Relation Age  of Onset   Heart attack Other    Tuberculosis Other    Tuberculosis Other    Diabetes type II Father    Hypertension Father    Heart attack Maternal Aunt      Prior to Admission medications   Medication Sig Start Date End Date Taking? Authorizing Provider  Alcohol Swabs (B-D SINGLE USE SWABS REGULAR) PADS Apply topically. 04/22/23   [provider]  aspirin EC 81 MG tablet Take 81 mg by mouth daily.     [provider]  atorvastatin (LIPITOR) 40 MG tablet Take 40 mg by mouth daily.    [provider]  hydrALAZINE (APRESOLINE) 100 MG tablet Take 1 tablet (100 mg total) by mouth 3 (three) times daily. 11/03/22   [name], MD  isosorbide mononitrate (IMDUR) 60 MG 24 hr tablet Take 1 tablet (60 mg total) by mouth daily. 05/04/22   [name], [name], PA-C  losartan-hydrochlorothiazide (HYZAAR) 100-25 MG tablet Take 1 tablet by mouth daily. 05/27/21   [provider]  metFORMIN (GLUCOPHAGE) 500 MG tablet Take 500 mg by mouth 2 (two) times a day. 02/28/19   [provider]  NON FORMULARY Diet Type:  NAS, Consistent Carbohydrate, bland diet 05/04/19   [provider]  spironolactone (ALDACTONE) 25 MG tablet Take 0.5 tablets (12.5 mg total) by mouth daily. 05/18/23 08/16/23  [name], MD  TRADJENTA 5 MG TABS tablet Take 5 mg by mouth daily. 05/27/21   [provider]  TRUE METRIX BLOOD GLUCOSE TEST test strip SMARTSIG:Via Meter 05/12/23   [provider]  verapamil (CALAN-SR) 240 MG CR tablet Take 240 mg by mouth daily. 05/27/21   [provider]    Physical Exam: BP (!) 143/84 (BP Location: Right Arm)   Pulse 97   Temp 98.9 F (37.2 C) (Oral)   Resp 16   Ht 5\' 6"  (1.676 m)   Wt 91.9 kg   SpO2 100%   BMI 32.70 kg/m   General: 82 y.o. year-old male well developed well nourished in no acute distress.  Alert and oriented x3. HEENT: NCAT, EOMI Neck: Supple, trachea medial Cardiovascular: Regular rate and rhythm with no rubs or gallops.  No thyromegaly or JVD noted.  No lower extremity edema. 2/4 pulses in all 4 extremities. Respiratory: Clear to auscultation with no wheezes or rales. Good inspiratory effort. Abdomen: Soft, nontender nondistended with normal bowel sounds x4 quadrants. Muskuloskeletal: No cyanosis, clubbing or edema noted bilaterally Neuro: CN II-XII intact, strength 5/5 x 4, sensation, reflexes  intact Skin: No ulcerative lesions noted or rashes Psychiatry: Judgement and insight appear normal. Mood is appropriate for condition and setting          Labs on Admission:  Basic Metabolic Panel: Recent Labs  Lab 06/20/23 1725  NA 135  K 4.2  CL 103  CO2 21*  GLUCOSE 193*  BUN 23  CREATININE 1.61*  CALCIUM 8.7*   Liver Function Tests: Recent Labs  Lab 06/20/23 1725  AST 32  ALT 27  ALKPHOS 87  BILITOT 0.9  PROT 6.9  ALBUMIN 3.4*   No results for input(s): "LIPASE", "AMYLASE" in the last 168 hours. No results for input(s): "AMMONIA" in the last 168 hours. CBC: Recent Labs  Lab 06/20/23 1725  WBC 11.4*  NEUTROABS 5.6  HGB 13.1  HCT 39.5  MCV 91.9  PLT 247   Cardiac Enzymes: No results for input(s): "CKTOTAL", "CKMB", "CKMBINDEX", "TROPONINI" in the last 168 hours.  BNP (last  3 results) No results for input(s): "BNP" in the last 8760 hours.  ProBNP (last 3 results) No results for input(s): "PROBNP" in the last 8760 hours.  CBG: Recent Labs  Lab 06/20/23 2318  GLUCAP 150*    Radiological Exams on Admission: CT Chest W Contrast  Result Date: 06/20/2023 CLINICAL DATA:  Shortness of breath. EXAM: CT CHEST WITH CONTRAST TECHNIQUE: Multidetector CT imaging of the chest was performed during intravenous contrast administration. RADIATION DOSE REDUCTION: This exam was performed according to the departmental dose-optimization program which includes automated exposure control, adjustment of the mA and/or kV according to patient size and/or use of iterative reconstruction technique. CONTRAST:  60mL OMNIPAQUE IOHEXOL 300 MG/ML  SOLN COMPARISON:  Chest radiograph dated 06/20/2023. FINDINGS: Cardiovascular: There is no cardiomegaly or pericardial effusion. There is 3 vessel coronary vascular calcification. Mild atherosclerotic calcification of the thoracic aorta. No aneurysmal dilatation or dissection. Evaluation of the pulmonary arteries is limited due to respiratory  motion and timing of the contrast and suboptimal opacification. There is apparent filling defects in the distal subsegmental branches of the right lower lobe (92/6 as well as an area of filling defect in the central and lobar right upper lobe pulmonary artery (62 and 59/6). These are concerning for possible pulmonary emboli. Dedicated CTA PE study or V/Q scan may provide better evaluation. No CT evidence of right heart straining. Mediastinum/Nodes: No hilar or mediastinal adenopathy. The esophagus and the thyroid gland are grossly unremarkable. No mediastinal fluid collection. Lungs/Pleura: Bibasilar linear atelectasis/scarring. No focal consolidation, pleural effusion, or pneumothorax. The central airways are patent. Upper Abdomen: Cholecystectomy. Partially visualized probable bilateral renal cysts. Musculoskeletal: Osteopenia with degenerative changes of the spine. No acute osseous pathology. IMPRESSION: 1. Findings concerning for possible pulmonary emboli in the right upper and lower lobes. Dedicated CTA PE study or V/Q scan may provide better evaluation. 2. No focal consolidation or pleural effusion. 3.  Aortic Atherosclerosis (ICD10-I70.0). These results were called by telephone at the time of interpretation on 06/20/2023 at 9:01 pm to provider [facility] ZAMMIT , who verbally acknowledged these results. Electronically Signed   By: Elgie Collard M.D.   On: 06/20/2023 21:09   CT Head Wo Contrast  Result Date: 06/20/2023 CLINICAL DATA:  Generalized weakness, altered level of consciousness, memory loss EXAM: CT HEAD WITHOUT CONTRAST TECHNIQUE: Contiguous axial images were obtained from the base of the skull through the vertex without intravenous contrast. RADIATION DOSE REDUCTION: This exam was performed according to the departmental dose-optimization program which includes automated exposure control, adjustment of the mA and/or kV according to patient size and/or use of iterative reconstruction technique.  COMPARISON:  None Available. FINDINGS: Evaluation is slightly limited due to patient motion throughout the exam. Brain: No acute infarct or hemorrhage. Focal hypodensities within the basal ganglia and right thalamus consistent with chronic lacunar infarcts. Lateral ventricles and remaining midline structures are unremarkable. No acute extra-axial fluid collections. No mass effect. Vascular: No hyperdense vessel or unexpected calcification. Skull: Normal. Negative for fracture or focal lesion. Sinuses/Orbits: No acute finding. Other: None. IMPRESSION: 1. No acute intracranial process. Electronically Signed   By: Sharlet Salina M.D.   On: 06/20/2023 20:53   DG Chest Port 1 View  Result Date: 06/20/2023 CLINICAL DATA:  Possible sepsis EXAM: PORTABLE CHEST 1 VIEW COMPARISON:  04/30/2019 FINDINGS: Low lung volumes. No focal opacity or pleural effusion. Normal cardiac size. No pneumothorax IMPRESSION: Low lung volumes. Electronically Signed   By: Jasmine Pang M.D.   On: 06/20/2023 18:57  EKG: I independently viewed the EKG done and my findings are as followed: Sinus tachycardia at a rate of 130 bpm with APCs    Assessment/Plan Present on Admission:  Acute pulmonary embolism (HCC)  CKD (chronic kidney disease), stage III (HCC)  Mixed hyperlipidemia  Principal Problem:   Acute pulmonary embolism (HCC) Active Problems:   Mixed hyperlipidemia   CKD (chronic kidney disease), stage III (HCC)   Urinary tract infection   Lactic acidosis   Hypoalbuminemia due to protein-calorie malnutrition (HCC)   Essential hypertension   Type 2 diabetes mellitus with hyperglycemia (HCC)   Obesity (BMI 30-39.9)  Acute pulmonary embolism CT chest with contrast showed findings concerning for possible pulmonary emboli in the right upper and lower lobes. Patient was started on IV heparin drip with plan to transition to DOAC in the morning Bilateral lower extremity ultrasound will be done in the  morning Echocardiogram will be done in the morning  ?  Presumed UTI POA Patient was started on IV ceftriaxone; We shall empirically continue with same antibiotic at this time with plan to discontinue based on urine culture  Lactic acidosis possibly secondary to multifactorial This may be due to hypoxia resulting in oxygen requirement Lactic acid 3.9 > 2.4 Continue to trend lactic acid  Hypoalbuminemia possibly secondary to mild protein calorie malnutrition Albumin 3.4, protein supplement will be provided  Chronic kidney disease stage III Creatinine 1.61 (creatinine appears to be within baseline range).  Renally adjust medications, avoid nephrotoxic agents/dehydration/hypotension  Type 2 diabetes mellitus with hyperglycemia ISS and hypoglycemia protocol  Essential hypertension Continue hydralazine, Imdur, losartan  Mixed hyperlipidemia Continue Lipitor  Obesity (BMI 32.70) Diet and lifestyle modification    DVT prophylaxis: Heparin drip   Advance Care Planning: Full code  Consults: None  Family Communication: None at bedside  Severity of Illness: The appropriate patient status for this patient is OBSERVATION. Observation status is judged to be reasonable and necessary in order to provide the required intensity of service to ensure the patient's safety. The patient's presenting symptoms, physical exam findings, and initial radiographic and laboratory data in the context of their medical condition is felt to place them at decreased risk for further clinical deterioration. Furthermore, it is anticipated that the patient will be medically stable for discharge from the hospital within 2 midnights of admission.   Author: Frankey Shown, DO 06/21/2023 2:38 AM  For on call review [email or web address].

## 2023-06-20 NOTE — Sepsis Progress Note (Signed)
Sepsis protocol monitored by eLink ?

## 2023-06-21 ENCOUNTER — Observation Stay (HOSPITAL_COMMUNITY): Payer: Medicare HMO

## 2023-06-21 ENCOUNTER — Other Ambulatory Visit (HOSPITAL_COMMUNITY): Payer: Self-pay | Admitting: *Deleted

## 2023-06-21 ENCOUNTER — Inpatient Hospital Stay (HOSPITAL_COMMUNITY): Payer: Medicare HMO

## 2023-06-21 DIAGNOSIS — Z833 Family history of diabetes mellitus: Secondary | ICD-10-CM | POA: Diagnosis not present

## 2023-06-21 DIAGNOSIS — I959 Hypotension, unspecified: Secondary | ICD-10-CM | POA: Diagnosis not present

## 2023-06-21 DIAGNOSIS — Z7982 Long term (current) use of aspirin: Secondary | ICD-10-CM | POA: Diagnosis not present

## 2023-06-21 DIAGNOSIS — R5381 Other malaise: Secondary | ICD-10-CM | POA: Diagnosis not present

## 2023-06-21 DIAGNOSIS — E8809 Other disorders of plasma-protein metabolism, not elsewhere classified: Secondary | ICD-10-CM | POA: Diagnosis present

## 2023-06-21 DIAGNOSIS — Z8249 Family history of ischemic heart disease and other diseases of the circulatory system: Secondary | ICD-10-CM | POA: Diagnosis not present

## 2023-06-21 DIAGNOSIS — R000 Tachycardia, unspecified: Secondary | ICD-10-CM | POA: Diagnosis not present

## 2023-06-21 DIAGNOSIS — N390 Urinary tract infection, site not specified: Secondary | ICD-10-CM | POA: Insufficient documentation

## 2023-06-21 DIAGNOSIS — R531 Weakness: Secondary | ICD-10-CM | POA: Diagnosis present

## 2023-06-21 DIAGNOSIS — I82443 Acute embolism and thrombosis of tibial vein, bilateral: Secondary | ICD-10-CM | POA: Diagnosis not present

## 2023-06-21 DIAGNOSIS — E1122 Type 2 diabetes mellitus with diabetic chronic kidney disease: Secondary | ICD-10-CM | POA: Diagnosis present

## 2023-06-21 DIAGNOSIS — Z86010 Personal history of colonic polyps: Secondary | ICD-10-CM | POA: Diagnosis not present

## 2023-06-21 DIAGNOSIS — Z6832 Body mass index (BMI) 32.0-32.9, adult: Secondary | ICD-10-CM | POA: Diagnosis not present

## 2023-06-21 DIAGNOSIS — Z7984 Long term (current) use of oral hypoglycemic drugs: Secondary | ICD-10-CM | POA: Diagnosis not present

## 2023-06-21 DIAGNOSIS — Z794 Long term (current) use of insulin: Secondary | ICD-10-CM | POA: Diagnosis not present

## 2023-06-21 DIAGNOSIS — R0902 Hypoxemia: Secondary | ICD-10-CM | POA: Diagnosis present

## 2023-06-21 DIAGNOSIS — Z79899 Other long term (current) drug therapy: Secondary | ICD-10-CM | POA: Diagnosis not present

## 2023-06-21 DIAGNOSIS — I824Z3 Acute embolism and thrombosis of unspecified deep veins of distal lower extremity, bilateral: Secondary | ICD-10-CM | POA: Diagnosis not present

## 2023-06-21 DIAGNOSIS — Z7901 Long term (current) use of anticoagulants: Secondary | ICD-10-CM | POA: Diagnosis not present

## 2023-06-21 DIAGNOSIS — Z9049 Acquired absence of other specified parts of digestive tract: Secondary | ICD-10-CM | POA: Diagnosis not present

## 2023-06-21 DIAGNOSIS — N1831 Chronic kidney disease, stage 3a: Secondary | ICD-10-CM | POA: Diagnosis present

## 2023-06-21 DIAGNOSIS — R944 Abnormal results of kidney function studies: Secondary | ICD-10-CM | POA: Diagnosis not present

## 2023-06-21 DIAGNOSIS — I10 Essential (primary) hypertension: Secondary | ICD-10-CM | POA: Insufficient documentation

## 2023-06-21 DIAGNOSIS — N179 Acute kidney failure, unspecified: Secondary | ICD-10-CM | POA: Diagnosis not present

## 2023-06-21 DIAGNOSIS — E1165 Type 2 diabetes mellitus with hyperglycemia: Secondary | ICD-10-CM | POA: Insufficient documentation

## 2023-06-21 DIAGNOSIS — Z86718 Personal history of other venous thrombosis and embolism: Secondary | ICD-10-CM | POA: Diagnosis not present

## 2023-06-21 DIAGNOSIS — R2689 Other abnormalities of gait and mobility: Secondary | ICD-10-CM | POA: Diagnosis not present

## 2023-06-21 DIAGNOSIS — E441 Mild protein-calorie malnutrition: Secondary | ICD-10-CM | POA: Diagnosis present

## 2023-06-21 DIAGNOSIS — Z86711 Personal history of pulmonary embolism: Secondary | ICD-10-CM | POA: Diagnosis not present

## 2023-06-21 DIAGNOSIS — Z1152 Encounter for screening for COVID-19: Secondary | ICD-10-CM | POA: Diagnosis not present

## 2023-06-21 DIAGNOSIS — E782 Mixed hyperlipidemia: Secondary | ICD-10-CM | POA: Diagnosis present

## 2023-06-21 DIAGNOSIS — R2681 Unsteadiness on feet: Secondary | ICD-10-CM | POA: Diagnosis not present

## 2023-06-21 DIAGNOSIS — E669 Obesity, unspecified: Secondary | ICD-10-CM | POA: Diagnosis present

## 2023-06-21 DIAGNOSIS — E872 Acidosis, unspecified: Secondary | ICD-10-CM | POA: Diagnosis present

## 2023-06-21 DIAGNOSIS — R55 Syncope and collapse: Secondary | ICD-10-CM | POA: Diagnosis not present

## 2023-06-21 DIAGNOSIS — I129 Hypertensive chronic kidney disease with stage 1 through stage 4 chronic kidney disease, or unspecified chronic kidney disease: Secondary | ICD-10-CM | POA: Diagnosis present

## 2023-06-21 DIAGNOSIS — R600 Localized edema: Secondary | ICD-10-CM | POA: Diagnosis not present

## 2023-06-21 DIAGNOSIS — G9341 Metabolic encephalopathy: Secondary | ICD-10-CM | POA: Diagnosis present

## 2023-06-21 DIAGNOSIS — I951 Orthostatic hypotension: Secondary | ICD-10-CM | POA: Diagnosis not present

## 2023-06-21 DIAGNOSIS — I2609 Other pulmonary embolism with acute cor pulmonale: Secondary | ICD-10-CM

## 2023-06-21 DIAGNOSIS — I2699 Other pulmonary embolism without acute cor pulmonale: Secondary | ICD-10-CM | POA: Diagnosis present

## 2023-06-21 DIAGNOSIS — E46 Unspecified protein-calorie malnutrition: Secondary | ICD-10-CM | POA: Insufficient documentation

## 2023-06-21 LAB — GLUCOSE, CAPILLARY
Glucose-Capillary: 146 mg/dL — ABNORMAL HIGH (ref 70–99)
Glucose-Capillary: 187 mg/dL — ABNORMAL HIGH (ref 70–99)
Glucose-Capillary: 188 mg/dL — ABNORMAL HIGH (ref 70–99)
Glucose-Capillary: 208 mg/dL — ABNORMAL HIGH (ref 70–99)

## 2023-06-21 LAB — HEPARIN LEVEL (UNFRACTIONATED): Heparin Unfractionated: 1.06 IU/mL — ABNORMAL HIGH (ref 0.30–0.70)

## 2023-06-21 MED ORDER — PERFLUTREN LIPID MICROSPHERE
1.0000 mL | INTRAVENOUS | Status: AC | PRN
  Administered 2023-06-21: 3 mL via INTRAVENOUS

## 2023-06-21 MED ORDER — HEPARIN (PORCINE) 25000 UT/250ML-% IV SOLN
1200.0000 [IU]/h | INTRAVENOUS | Status: DC
  Administered 2023-06-21 (×2): 1200 [IU]/h via INTRAVENOUS
  Filled 2023-06-21: qty 250

## 2023-06-21 MED ORDER — HEPARIN (PORCINE) 25000 UT/250ML-% IV SOLN: 1000.0000 [IU]/h | INTRAVENOUS | Status: DC

## 2023-06-21 MED ORDER — SODIUM CHLORIDE 0.9 % IR SOLN
3000.0000 mL | Status: DC
  Administered 2023-06-22: 3000 mL

## 2023-06-21 MED ORDER — MAGNESIUM SULFATE 2 GM/50ML IV SOLN
2.0000 g | Freq: Once | INTRAVENOUS | Status: AC
  Administered 2023-06-21: 2 g via INTRAVENOUS
  Filled 2023-06-21: qty 50

## 2023-06-21 NOTE — Progress Notes (Signed)
PROGRESS NOTE    SOCTT OBROCHTA Rogers.  YNW:[id number] DOB: Jan 16, 1941 DOA: 06/20/2023 PCP: [name], MD   Brief Narrative:    Stephen Rogers. is a 82 y.o. male with medical history significant of T2DM, hypertension, hyperlipidemia, CKD stage III who presents to the emergency department due to several days of generalized weakness and altered mental status.  Patient was admitted for acute metabolic encephalopathy in the setting of acute pulmonary embolism as well as possible UTI.  He has been started on heparin drip as well as Rocephin with further studies pending.  Plan to discontinue Rocephin as UA does not look convincing for UTI.  Await further 2D echocardiogram results.  Bilateral lower extremity ultrasound positive for DVT.  Assessment & Plan:   Principal Problem:   Acute pulmonary embolism (HCC) Active Problems:   Mixed hyperlipidemia   CKD (chronic kidney disease), stage III (HCC)   Urinary tract infection   Lactic acidosis   Hypoalbuminemia due to protein-calorie malnutrition (HCC)   Essential hypertension   Type 2 diabetes mellitus with hyperglycemia (HCC)   Obesity (BMI 30-39.9)  Assessment and Plan:  Acute pulmonary embolism CT chest with contrast showed findings concerning for possible pulmonary emboli in the right upper and lower lobes. Patient was started on IV heparin drip with plan to transition to DOAC in the morning Bilateral lower extremity ultrasound positive for bilateral DVT Echocardiogram ordered and pending   ?  Presumed UTI POA UA does not look convincing for UTI, discontinue Rocephin   Lactic acidosis possibly secondary to multifactorial-resolved This may be due to hypoxia resulting in oxygen requirement No further need to trend   Hypoalbuminemia possibly secondary to mild protein calorie malnutrition Albumin 3.4, protein supplement will be provided   Chronic kidney disease stage III Creatinine 1.61 (creatinine appears to be  within baseline range).  Renally adjust medications, avoid nephrotoxic agents/dehydration/hypotension   Type 2 diabetes mellitus with hyperglycemia ISS and hypoglycemia protocol   Essential hypertension Continue hydralazine, Imdur, losartan   Mixed hyperlipidemia Continue Lipitor   Obesity (BMI 32.70) Diet and lifestyle modification    DVT prophylaxis:Heparin drip Code Status: Full Family Communication: None at bedside Disposition Plan:  Status is: Inpatient Remains inpatient appropriate because: Need for IV medication.    Consultants:  None  Procedures:  None  Antimicrobials:  Anti-infectives (From admission, onward)    Start     Dose/Rate Route Frequency Ordered Stop   06/20/23 1730  cefTRIAXone (ROCEPHIN) 2 g in sodium chloride 0.9 % 100 mL IVPB        2 g 200 mL/hr over 30 Minutes Intravenous Every 24 hours 06/20/23 1724 06/27/23 1759      Subjective: Patient seen and evaluated today with no new acute complaints or concerns. No acute concerns or events noted overnight.  He denies any chest pain or shortness of breath.  No further confusion noted.  Objective: Vitals:   06/20/23 2256 06/20/23 2258 06/21/23 0309 06/21/23 0540  BP:  (!) 143/84 132/82 (!) 143/77  Pulse:  97 85 88  Resp:  16 16 16   Temp:  98.9 F (37.2 C) 98.2 F (36.8 C) 98.5 F (36.9 C)  TempSrc:  Oral Oral Oral  SpO2: 100% 100% 100% 100%  Weight:      Height:        Intake/Output Summary (Last 24 hours) at 06/21/2023 0658 Last data filed at 06/21/2023 0340 Gross per 24 hour  Intake 3100 ml  Output 700 ml  Net  2400 ml   Filed Weights   06/20/23 1702  Weight: 91.9 kg    Examination:  General exam: Appears calm and comfortable  Respiratory system: Clear to auscultation. Respiratory effort normal.  Nasal cannula oxygen. Cardiovascular system: S1 & S2 heard, RRR.  Gastrointestinal system: Abdomen is soft Central nervous system: Alert and awake Extremities: No edema Skin: No  significant lesions noted Psychiatry: Flat affect.    Data Reviewed: I have personally reviewed following labs and imaging studies  CBC: Recent Labs  Lab 06/20/23 1725 06/21/23 0406  WBC 11.4* 8.9  NEUTROABS 5.6  --   HGB 13.1 11.7*  HCT 39.5 36.3*  MCV 91.9 92.4  PLT 247 226   Basic Metabolic Panel: Recent Labs  Lab 06/20/23 1725 06/21/23 0406  NA 135 136  K 4.2 4.2  CL 103 105  CO2 21* 22  GLUCOSE 193* 148*  BUN 23 20  CREATININE 1.61* 1.26*  CALCIUM 8.7* 8.1*  MG  --  1.6*  PHOS  --  3.3   GFR: Estimated Creatinine Clearance: 47.1 mL/min (A) (by C-G formula based on SCr of 1.26 mg/dL (H)). Liver Function Tests: Recent Labs  Lab 06/20/23 1725 06/21/23 0406  AST 32 23  ALT 27 26  ALKPHOS 87 80  BILITOT 0.9 0.8  PROT 6.9 6.3*  ALBUMIN 3.4* 3.2*   No results for input(s): "LIPASE", "AMYLASE" in the last 168 hours. No results for input(s): "AMMONIA" in the last 168 hours. Coagulation Profile: Recent Labs  Lab 06/20/23 1725  INR 1.0   Cardiac Enzymes: No results for input(s): "CKTOTAL", "CKMB", "CKMBINDEX", "TROPONINI" in the last 168 hours. BNP (last 3 results) No results for input(s): "PROBNP" in the last 8760 hours. HbA1C: No results for input(s): "HGBA1C" in the last 72 hours. CBG: Recent Labs  Lab 06/20/23 2318  GLUCAP 150*   Lipid Profile: No results for input(s): "CHOL", "HDL", "LDLCALC", "TRIG", "CHOLHDL", "LDLDIRECT" in the last 72 hours. Thyroid Function Tests: No results for input(s): "TSH", "T4TOTAL", "FREET4", "T3FREE", "THYROIDAB" in the last 72 hours. Anemia Panel: No results for input(s): "VITAMINB12", "FOLATE", "FERRITIN", "TIBC", "IRON", "RETICCTPCT" in the last 72 hours. Sepsis Labs: Recent Labs  Lab 06/20/23 1725 06/20/23 2005 06/20/23 2321  LATICACIDVEN 3.9* 2.4* 1.8    Recent Results (from the past 240 hour(s))  Resp panel by RT-PCR (RSV, Flu A&B, Covid) Anterior Nasal Swab     Status: None   Collection Time:  06/20/23  5:25 PM   Specimen: Anterior Nasal Swab  Result Value Ref Range Status   SARS Coronavirus 2 by RT PCR NEGATIVE NEGATIVE Final    Comment: (NOTE) SARS-CoV-2 target nucleic acids are NOT DETECTED.  The SARS-CoV-2 RNA is generally detectable in upper respiratory specimens during the acute phase of infection. The lowest concentration of SARS-CoV-2 viral copies this assay can detect is 138 copies/mL. A negative result does not preclude SARS-Cov-2 infection and should not be used as the sole basis for treatment or other patient management decisions. A negative result may occur with  improper specimen collection/handling, submission of specimen other than nasopharyngeal swab, presence of viral mutation(s) within the areas targeted by this assay, and inadequate number of viral copies(<138 copies/mL). A negative result must be combined with clinical observations, patient history, and epidemiological information. The expected result is Negative.  Fact Sheet for Patients:  BloggerCourse.com  Fact Sheet for Healthcare Providers:  SeriousBroker.it  This test is no t yet approved or cleared by the [place] FDA and  has  been authorized for detection and/or diagnosis of SARS-CoV-2 by FDA under an Emergency Use Authorization (EUA). This EUA will remain  in effect (meaning this test can be used) for the duration of the COVID-19 declaration under Section 564(b)(1) of the Act, 21 U.S.C.section 360bbb-3(b)(1), unless the authorization is terminated  or revoked sooner.       Influenza A by PCR NEGATIVE NEGATIVE Final   Influenza B by PCR NEGATIVE NEGATIVE Final    Comment: (NOTE) The Xpert Xpress SARS-CoV-2/FLU/RSV plus assay is intended as an aid in the diagnosis of influenza from Nasopharyngeal swab specimens and should not be used as a sole basis for treatment. Nasal washings and aspirates are unacceptable for Xpert Xpress  SARS-CoV-2/FLU/RSV testing.  Fact Sheet for Patients: BloggerCourse.com  Fact Sheet for Healthcare Providers: SeriousBroker.it  This test is not yet approved or cleared by the [place] FDA and has been authorized for detection and/or diagnosis of SARS-CoV-2 by FDA under an Emergency Use Authorization (EUA). This EUA will remain in effect (meaning this test can be used) for the duration of the COVID-19 declaration under Section 564(b)(1) of the Act, 21 U.S.C. section 360bbb-3(b)(1), unless the authorization is terminated or revoked.     Resp Syncytial Virus by PCR NEGATIVE NEGATIVE Final    Comment: (NOTE) Fact Sheet for Patients: BloggerCourse.com  Fact Sheet for Healthcare Providers: SeriousBroker.it  This test is not yet approved or cleared by the [place] FDA and has been authorized for detection and/or diagnosis of SARS-CoV-2 by FDA under an Emergency Use Authorization (EUA). This EUA will remain in effect (meaning this test can be used) for the duration of the COVID-19 declaration under Section 564(b)(1) of the Act, 21 U.S.C. section 360bbb-3(b)(1), unless the authorization is terminated or revoked.  Performed at [facility], [address] [place], [place] [address]   Blood Culture (routine x 2)     Status: None (Preliminary result)   Collection Time: 06/20/23  5:56 PM   Specimen: Blood  Result Value Ref Range Status   Specimen Description BLOOD RIGHT ARM  Final   Special Requests   Final    BOTTLES DRAWN AEROBIC AND ANAEROBIC Blood Culture results may not be optimal due to an inadequate volume of blood received in culture bottles Performed at [facility], [address], [place], [place] [address]    Culture PENDING  Incomplete   Report Status PENDING  Incomplete  Blood Culture (routine x 2)     Status: None (Preliminary result)   Collection Time:  06/20/23  6:51 PM   Specimen: BLOOD  Result Value Ref Range Status   Specimen Description BLOOD BLOOD LEFT HAND  Final   Special Requests   Final    Blood Culture adequate volume Performed at [facility], [address] [place], [place] [address]    Culture PENDING  Incomplete   Report Status PENDING  Incomplete         Radiology Studies: CT Chest W Contrast  Result Date: 06/20/2023 CLINICAL DATA:  Shortness of breath. EXAM: CT CHEST WITH CONTRAST TECHNIQUE: Multidetector CT imaging of the chest was performed during intravenous contrast administration. RADIATION DOSE REDUCTION: This exam was performed according to the departmental dose-optimization program which includes automated exposure control, adjustment of the mA and/or kV according to patient size and/or use of iterative reconstruction technique. CONTRAST:  60mL OMNIPAQUE IOHEXOL 300 MG/ML  SOLN COMPARISON:  Chest radiograph dated 06/20/2023. FINDINGS: Cardiovascular: There is no cardiomegaly or pericardial effusion. There is 3 vessel  coronary vascular calcification. Mild atherosclerotic calcification of the thoracic aorta. No aneurysmal dilatation or dissection. Evaluation of the pulmonary arteries is limited due to respiratory motion and timing of the contrast and suboptimal opacification. There is apparent filling defects in the distal subsegmental branches of the right lower lobe (92/6 as well as an area of filling defect in the central and lobar right upper lobe pulmonary artery (62 and 59/6). These are concerning for possible pulmonary emboli. Dedicated CTA PE study or V/Q scan may provide better evaluation. No CT evidence of right heart straining. Mediastinum/Nodes: No hilar or mediastinal adenopathy. The esophagus and the thyroid gland are grossly unremarkable. No mediastinal fluid collection. Lungs/Pleura: Bibasilar linear atelectasis/scarring. No focal consolidation, pleural effusion, or pneumothorax. The central airways are  patent. Upper Abdomen: Cholecystectomy. Partially visualized probable bilateral renal cysts. Musculoskeletal: Osteopenia with degenerative changes of the spine. No acute osseous pathology. IMPRESSION: 1. Findings concerning for possible pulmonary emboli in the right upper and lower lobes. Dedicated CTA PE study or V/Q scan may provide better evaluation. 2. No focal consolidation or pleural effusion. 3.  Aortic Atherosclerosis (ICD10-I70.0). These results were called by telephone at the time of interpretation on 06/20/2023 at 9:01 pm to provider [facility] , who verbally acknowledged these results. Electronically Signed   By: Elgie Collard M.D.   On: 06/20/2023 21:09   CT Head Wo Contrast  Result Date: 06/20/2023 CLINICAL DATA:  Generalized weakness, altered level of consciousness, memory loss EXAM: CT HEAD WITHOUT CONTRAST TECHNIQUE: Contiguous axial images were obtained from the base of the skull through the vertex without intravenous contrast. RADIATION DOSE REDUCTION: This exam was performed according to the departmental dose-optimization program which includes automated exposure control, adjustment of the mA and/or kV according to patient size and/or use of iterative reconstruction technique. COMPARISON:  None Available. FINDINGS: Evaluation is slightly limited due to patient motion throughout the exam. Brain: No acute infarct or hemorrhage. Focal hypodensities within the basal ganglia and right thalamus consistent with chronic lacunar infarcts. Lateral ventricles and remaining midline structures are unremarkable. No acute extra-axial fluid collections. No mass effect. Vascular: No hyperdense vessel or unexpected calcification. Skull: Normal. Negative for fracture or focal lesion. Sinuses/Orbits: No acute finding. Other: None. IMPRESSION: 1. No acute intracranial process. Electronically Signed   By: Sharlet Salina M.D.   On: 06/20/2023 20:53   DG Chest Port 1 View  Result Date: 06/20/2023 CLINICAL  DATA:  Possible sepsis EXAM: PORTABLE CHEST 1 VIEW COMPARISON:  04/30/2019 FINDINGS: Low lung volumes. No focal opacity or pleural effusion. Normal cardiac size. No pneumothorax IMPRESSION: Low lung volumes. Electronically Signed   By: Jasmine Pang M.D.   On: 06/20/2023 18:57        Scheduled Meds:  atorvastatin  40 mg Oral Daily   feeding supplement (GLUCERNA SHAKE)  237 mL Oral TID BM   hydrALAZINE  100 mg Oral TID   insulin aspart  0-9 Units Subcutaneous TID WC   isosorbide mononitrate  60 mg Oral Daily   losartan  100 mg Oral Daily   Continuous Infusions:  cefTRIAXone (ROCEPHIN)  IV Stopped (06/20/23 1936)   heparin 1,400 Units/hr (06/20/23 2206)   magnesium sulfate bolus IVPB       LOS: 0 days    Time spent: 35 minutes    Kashawna Manzer Hoover Brunette, DO [facility]  If 7PM-7AM, please contact night-coverage [email or web address] 06/21/2023, 6:58 AM

## 2023-06-21 NOTE — Progress Notes (Signed)
200 mls of bloody urine output just now.  Dr. Sherryll Burger assessed and will put in order for foley with CBI and monitor hgb.

## 2023-06-21 NOTE — Progress Notes (Signed)
Patient has order for a three way foley, per Dr Margaretmary Eddy orders. Waiting for the three way foley catheter to be delivered by [facility] RN. Patient has been educated about foley catheter. Plan of care on going.

## 2023-06-21 NOTE — TOC Initial Note (Signed)
Transition of Care (TOC) - Initial/Assessment Note    Patient Details  Name: Stephen FARRAN Sr. MRN: [id number] Date of Birth: 02-Apr-1941  Transition of Care [facility]) CM/SW Contact:    Annice Needy, LCSW Phone Number: 06/21/2023, 10:23 AM  Clinical Narrative:                 Patient from home with son. Patient confused, sister, Dois Davenport, retired Charity fundraiser, provided history. Admitted with PE and UTI. Uses a cane for ambulation. Sister states that patinet has a lot of stuff in his home and she has been cleaning it out since he went to the hospital. Patient bathes about weekly and dresses himself. He does not drive.   Expected Discharge Plan: Home/Self Care Barriers to Discharge: Continued Medical Work up   Patient Goals and CMS Choice Patient states their goals for this hospitalization and ongoing recovery are:: return home          Expected Discharge Plan and Services       Living arrangements for the past 2 months: Single Family Home                                      Prior Living Arrangements/Services Living arrangements for the past 2 months: Single Family Home Lives with:: Adult Children Patient language and need for interpreter reviewed:: Yes        Need for Family Participation in Patient Care: Yes (Comment) Care giver support system in place?: Yes (comment) Current home services: DME (cane) Criminal Activity/Legal Involvement Pertinent to Current Situation/Hospitalization: No - Comment as needed  Activities of Daily Living      Permission Sought/Granted Permission sought to share information with : Family Supports    Share Information with NAME: sister, Dois Davenport           Emotional Assessment         Alcohol / Substance Use: Not Applicable Psych Involvement: No (comment)  Admission diagnosis:  Hypoxia [R09.02] Acute pulmonary embolism (HCC) [I26.99] Patient Active Problem List   Diagnosis Date Noted   Urinary tract infection 06/21/2023    Lactic acidosis 06/21/2023   Hypoalbuminemia due to protein-calorie malnutrition (HCC) 06/21/2023   Essential hypertension 06/21/2023   Type 2 diabetes mellitus with hyperglycemia (HCC) 06/21/2023   Obesity (BMI 30-39.9) 06/21/2023   Acute pulmonary embolism (HCC) 06/20/2023   Elevated liver enzymes 05/15/2019   Intra-abdominal abscess (HCC) 05/15/2019   CKD stage 3 due to type 2 diabetes mellitus (HCC) 05/07/2019   Dyslipidemia associated with type 2 diabetes mellitus (HCC) 05/07/2019   Hypertension associated with stage 3 chronic kidney disease due to type 2 diabetes mellitus (HCC) 05/07/2019   GERD without esophagitis 05/07/2019   Calculus of gallbladder with cholecystitis without biliary obstruction    Choloenteric fistula    History of cholecystitis    Rectal bleeding 04/18/2019   Anemia 04/18/2019   Acute GI bleeding    Cholecystitis    Fever    At high risk for fluid overload 10/13/2016   Fluid overload 10/13/2016   Bilateral lower extremity edema 10/13/2016   Gait disturbance 10/13/2016   Leukocytosis 10/13/2016   CKD (chronic kidney disease), stage III (HCC) 10/13/2016   Left leg DVT (HCC) 10/13/2016   Acute renal failure (ARF) (HCC) 10/08/2016   Nausea and vomiting 10/08/2016   Type 2 diabetes mellitus, controlled, with renal complications (HCC) 10/08/2016   Transaminitis 10/08/2016  Mixed hyperlipidemia 10/08/2016   Renal insufficiency    Dehydration 10/07/2016   PCP:  [name], MD Pharmacy:   [facility] - [place], [place] - [address] [place] [address] Phone: [phone] Fax: [phone]  [facility] [address] [place] - [address] [place] [address] [place] [address] [place] [address] [place] [address] Phone: [phone] Fax: [phone]  [facility] [address] [place] [address] Phone: [phone] Fax: [phone]     Social Determinants of Health  (SDOH) Social History: SDOH Screenings   Food Insecurity: No Food Insecurity (06/21/2023)  Housing: Low Risk  (06/21/2023)  Transportation Needs: No Transportation Needs (06/21/2023)  Utilities: Not At Risk (06/21/2023)  Depression (PHQ2-9): Low Risk  (08/01/2020)  Tobacco Use: Low Risk  (06/20/2023)   SDOH Interventions:     Readmission Risk Interventions     No data to display

## 2023-06-21 NOTE — Progress Notes (Signed)
ANTICOAGULATION CONSULT NOTE -  Pharmacy Consult for heparin Indication: new pulmonary embolus  No Known Allergies  Patient Measurements: Height: 5\' 6"  (167.6 cm) Weight: 91.9 kg (202 lb 9.6 oz) IBW/kg (Calculated) : 63.8 Heparin Dosing Weight: 83.4 kg  Vital Signs: Temp: 98.5 F (36.9 C) (07/30 0540) Temp Source: Oral (07/30 0540) BP: 143/77 (07/30 0540) Pulse Rate: 88 (07/30 0540)  Labs: Recent Labs    06/20/23 1725 06/21/23 0406  HGB 13.1 11.7*  HCT 39.5 36.3*  PLT 247 226  APTT 23* 166*  LABPROT 13.5  --   INR 1.0  --   HEPARINUNFRC  --  >1.10*  CREATININE 1.61* 1.26*    Estimated Creatinine Clearance: 47.1 mL/min (A) (by C-G formula based on SCr of 1.26 mg/dL (H)).   Medical History: Past Medical History:  Diagnosis Date   CKD (chronic kidney disease), stage III (HCC)    Diabetes mellitus    DVT (deep venous thrombosis) (HCC) 10/14/2016   Extremity edema 09/2016   Hyperlipemia    Hypertension     Assessment: Patient is an 84 yom who presented to ED with generalized weakness and appearing more altered per the family. CT with concern for PE, no heart strain noted. Pharmacy has been consulted for heparin dosing. Pt is tachycardic with HR >100, satting 81-96% on 2L [place], on room air before.   No PTA anticoagulants documented, limited medical history in chart but is stated to have had a DVT in 2017. Baseline hgb is wnl at 13.1, plts wnl at 247.   HL > 1.1, supratherapeutic. Notified RN an will hold for 1 hour and restart at lower rate.  Goal of Therapy:  Heparin level 0.3-0.7 units/ml Monitor platelets by anticoagulation protocol: Yes   Plan:  Hold heparin x 1 hours and decrease heparin infusion to 1200 units/hr Check anti-Xa level in 8 hours and then daily while on heparin Continue to monitor H&H and platelets  Elder Cyphers, BS Pharm D, BCPS Clinical Pharmacist 06/21/2023,7:42 AM

## 2023-06-21 NOTE — Progress Notes (Addendum)
ANTICOAGULATION CONSULT NOTE - Follow-up Note  Pharmacy Consult for Heparin Indication: pulmonary embolus  No Known Allergies  Patient Measurements: Height: 5\' 6"  (167.6 cm) Weight: 91.9 kg (202 lb 9.6 oz) IBW/kg (Calculated) : 63.8 Heparin Dosing Weight: 83.4 kg  Vital Signs: Temp: 97.9 F (36.6 C) (07/30 0803) Temp Source: Oral (07/30 0803) BP: 168/87 (07/30 0803) Pulse Rate: 99 (07/30 0803)  Labs: Recent Labs    06/20/23 1725 06/21/23 0406  HGB 13.1 11.7*  HCT 39.5 36.3*  PLT 247 226  APTT 23* 166*  LABPROT 13.5  --   INR 1.0  --   HEPARINUNFRC  --  >1.10*  CREATININE 1.61* 1.26*    Estimated Creatinine Clearance: 47.1 mL/min (A) (by C-G formula based on SCr of 1.26 mg/dL (H)).   Medical History: Past Medical History:  Diagnosis Date   CKD (chronic kidney disease), stage III (HCC)    Diabetes mellitus    DVT (deep venous thrombosis) (HCC) 10/14/2016   Extremity edema 09/2016   Hyperlipemia    Hypertension     Medications:  Medications Prior to Admission  Medication Sig Dispense Refill Last Dose   aspirin EC 81 MG tablet Take 81 mg by mouth daily.   06/20/2023   atorvastatin (LIPITOR) 40 MG tablet Take 40 mg by mouth daily.   06/20/2023   hydrALAZINE (APRESOLINE) 100 MG tablet Take 1 tablet (100 mg total) by mouth 3 (three) times daily. 270 tablet 3 06/20/2023   isosorbide mononitrate (IMDUR) 30 MG 24 hr tablet Take 30 mg by mouth daily.   06/20/2023   losartan-hydrochlorothiazide (HYZAAR) 100-25 MG tablet Take 1 tablet by mouth daily.   06/20/2023   metFORMIN (GLUCOPHAGE) 500 MG tablet Take 500 mg by mouth 2 (two) times a day.   06/20/2023   TRADJENTA 5 MG TABS tablet Take 5 mg by mouth daily.   06/20/2023   verapamil (CALAN-SR) 240 MG CR tablet Take 240 mg by mouth daily.   06/20/2023   Alcohol Swabs (B-D SINGLE USE SWABS REGULAR) PADS Apply topically.      NON FORMULARY Diet Type:  NAS, Consistent Carbohydrate, bland diet      spironolactone (ALDACTONE) 25  MG tablet Take 0.5 tablets (12.5 mg total) by mouth daily. 45 tablet 3    TRUE METRIX BLOOD GLUCOSE TEST test strip SMARTSIG:Via Meter      Scheduled:   atorvastatin  40 mg Oral Daily   feeding supplement (GLUCERNA SHAKE)  237 mL Oral TID BM   hydrALAZINE  100 mg Oral TID   insulin aspart  0-9 Units Subcutaneous TID WC   isosorbide mononitrate  60 mg Oral Daily   losartan  100 mg Oral Daily   Infusions:   heparin 1,200 Units/hr (06/21/23 1309)   PRN: acetaminophen **OR** acetaminophen, ondansetron **OR** ondansetron (ZOFRAN) IV, perflutren lipid microspheres (DEFINITY) IV suspension  Assessment: Patient is an 89 yom who presented to ED with generalized weakness and appearing more altered per the family. CT with concern for PE, no heart strain noted. Pharmacy has been consulted for heparin dosing. Pt is tachycardic with HR >100, satting 81-96% on 2L [place], on room air before.    No PTA anticoagulants documented, limited medical history in chart but is stated to have had a DVT in 2017.  Started on 1400 units/hr IV heparin following 5000 unit IV heparin bolus. Resulting heparin level was >1.1 which is supra-therapeutic.  Infusion was held 1 hr and decreased to 1200 units/hr. Repeat level is 1.06 which  is supra-therapeutic.  of BRB in foley output per RN. MD Sherryll Burger notified of this and plan was for continuous bladder irrigation.   Hgb 13.1>11.7; plt 247>226  Goal of Therapy:  Heparin level 0.3-0.7 units/ml Monitor platelets by anticoagulation protocol: Yes   Plan:  Hold heparin infusion for 1 hour Decrease heparin infusion to 1000 units/hr --MD Sherryll Burger notified of plan given bloody foley output noted by RN Check anti-Xa level at 0400 and daily while on heparin Continue to monitor H&H and platelets  Delmar Landau, PharmD, BCPS 06/21/2023 5:04 PM ED Clinical Pharmacist -  [phone]

## 2023-06-21 NOTE — Progress Notes (Signed)
*  PRELIMINARY RESULTS* Echocardiogram 2D Echocardiogram has been performed with Definity.  Stacey Drain 06/21/2023, 3:55 PM

## 2023-06-22 DIAGNOSIS — I824Z3 Acute embolism and thrombosis of unspecified deep veins of distal lower extremity, bilateral: Secondary | ICD-10-CM

## 2023-06-22 DIAGNOSIS — E872 Acidosis, unspecified: Secondary | ICD-10-CM | POA: Diagnosis not present

## 2023-06-22 DIAGNOSIS — N1831 Chronic kidney disease, stage 3a: Secondary | ICD-10-CM | POA: Diagnosis not present

## 2023-06-22 DIAGNOSIS — I2699 Other pulmonary embolism without acute cor pulmonale: Secondary | ICD-10-CM | POA: Diagnosis not present

## 2023-06-22 LAB — GLUCOSE, CAPILLARY
Glucose-Capillary: 149 mg/dL — ABNORMAL HIGH (ref 70–99)
Glucose-Capillary: 283 mg/dL — ABNORMAL HIGH (ref 70–99)
Glucose-Capillary: 125 mg/dL — ABNORMAL HIGH (ref 70–99)
Glucose-Capillary: 194 mg/dL — ABNORMAL HIGH (ref 70–99)

## 2023-06-22 MED ORDER — INSULIN ASPART 100 UNIT/ML IJ SOLN
3.0000 [IU] | Freq: Three times a day (TID) | INTRAMUSCULAR | Status: DC
  Administered 2023-06-23: 3 [IU] via SUBCUTANEOUS

## 2023-06-22 MED ORDER — VERAPAMIL HCL ER 180 MG PO TBCR
180.0000 mg | EXTENDED_RELEASE_TABLET | Freq: Every day | ORAL | Status: DC
  Administered 2023-06-22: 180 mg via ORAL
  Filled 2023-06-22: qty 1

## 2023-06-22 MED ORDER — ISOSORBIDE MONONITRATE ER 60 MG PO TB24
30.0000 mg | ORAL_TABLET | Freq: Every day | ORAL | Status: DC
  Administered 2023-06-23: 30 mg via ORAL
  Filled 2023-06-22: qty 1

## 2023-06-22 MED ORDER — APIXABAN 5 MG PO TABS
10.0000 mg | ORAL_TABLET | Freq: Two times a day (BID) | ORAL | Status: DC
  Administered 2023-06-22 – 2023-06-23 (×3): 10 mg via ORAL
  Filled 2023-06-22 (×3): qty 2

## 2023-06-22 MED ORDER — VERAPAMIL HCL ER 180 MG PO TBCR: 180.0000 mg | EXTENDED_RELEASE_TABLET | Freq: Every day | ORAL | Status: DC

## 2023-06-22 MED ORDER — VERAPAMIL HCL ER 240 MG PO TBCR: 240.0000 mg | EXTENDED_RELEASE_TABLET | Freq: Every day | ORAL | Status: DC

## 2023-06-22 MED ORDER — VERAPAMIL HCL ER 240 MG PO TBCR
240.0000 mg | EXTENDED_RELEASE_TABLET | Freq: Every day | ORAL | Status: DC
  Administered 2023-06-23: 240 mg via ORAL
  Filled 2023-06-22: qty 1

## 2023-06-22 MED ORDER — MAGNESIUM SULFATE 2 GM/50ML IV SOLN
2.0000 g | Freq: Once | INTRAVENOUS | Status: AC
  Administered 2023-06-22: 2 g via INTRAVENOUS
  Filled 2023-06-22: qty 50

## 2023-06-22 MED ORDER — APIXABAN 5 MG PO TABS: 5.0000 mg | ORAL_TABLET | Freq: Two times a day (BID) | ORAL | Status: DC

## 2023-06-22 MED ORDER — HYDRALAZINE HCL 25 MG PO TABS
50.0000 mg | ORAL_TABLET | Freq: Three times a day (TID) | ORAL | Status: DC
  Administered 2023-06-23: 50 mg via ORAL
  Filled 2023-06-22: qty 2

## 2023-06-22 NOTE — Progress Notes (Signed)
PROGRESS NOTE    KAWAI DEADMON Rogers.  WUJ:[id number] DOB: [birth year] DOA: 06/20/2023 PCP: [name], MD   Brief Narrative:    Stephen Rogers. is a 82 y.o. male with medical history significant of T2DM, hypertension, hyperlipidemia, CKD stage III who presents to the emergency department due to several days of generalized weakness and altered mental status.  Patient was admitted for acute metabolic encephalopathy in the setting of acute pulmonary embolism as well as possible UTI.  He has been started on heparin drip as well as Rocephin with further studies pending.  Plan to discontinue Rocephin as UA does not look convincing for UTI.  Await further 2D echocardiogram results.  Bilateral lower extremity ultrasound positive for DVT.  Assessment & Plan:   Principal Problem:   Acute pulmonary embolism (HCC) Active Problems:   Mixed hyperlipidemia   CKD (chronic kidney disease), stage III (HCC)   Urinary tract infection   Lactic acidosis   Hypoalbuminemia due to protein-calorie malnutrition (HCC)   Essential hypertension   Type 2 diabetes mellitus with hyperglycemia (HCC)   Obesity (BMI 30-39.9)  Assessment and Plan:  Acute pulmonary embolism CT chest with contrast showed findings concerning for possible pulmonary emboli in the right upper and lower lobes. Patient was started on IV heparin drip, then on 7/31 transitioned to oral apixaban Bilateral lower extremity ultrasound positive for bilateral DVT Echocardiogram: IMPRESSIONS  1. Left ventricular ejection fraction, by estimation, is >75%. The left  ventricle has hyperdynamic function. The left ventricle has no regional wall motion abnormalities. Left ventricular diastolic parameters are consistent with Grade I diastolic dysfunction (impaired relaxation).   2. Right ventricular systolic function is normal. The right ventricular size is normal. There is mildly elevated pulmonary artery systolic pressure.   3. The mitral  valve is normal in structure. No evidence of mitral valve regurgitation. No evidence of mitral stenosis.   4. The tricuspid valve is abnormal.   5. The aortic valve is tricuspid. Aortic valve regurgitation is not visualized. No aortic stenosis is present.   6. The inferior vena cava is normal in size with greater than 50% respiratory variability, suggesting right atrial pressure of 3 mmHg.    UTI ruled out   Lactic acidosis -resolved This may be due to hypoxia resulting in oxygen requirement No further need to trend   Hypoalbuminemia possibly secondary to mild protein calorie malnutrition Albumin 3.4, protein supplement will be provided   Chronic kidney disease stage IIIa Creatinine 1.61 (creatinine appears to be within baseline range).  Renally adjust medications, avoid nephrotoxic agents/dehydration/hypotension   Type 2 diabetes mellitus with hyperglycemia ISS and hypoglycemia protocol Added novolog 3 units TID with meals>50% eaten  CBG (last 3)  Recent Labs    06/21/23 2122 06/22/23 0743 06/22/23 1118  GLUCAP 208* 149* 283*    Essential hypertension Continue hydralazine, Imdur, losartan, verapamil  Sinus Tachycardia - secondary to acute PE - verapamil was not restarted from admission - restarted home verapamil 7/31    Mixed hyperlipidemia Continue Lipitor   Obesity (BMI 32.70) Diet and lifestyle modification    DVT prophylaxis: apixaban Code Status: Full Family Communication: None at bedside Disposition Plan:  Status is: Inpatient Remains inpatient appropriate because: Need for IV medication.  Consultants:  None  Procedures:  Echocardiogram 06/21/23   Antimicrobials:  Anti-infectives (From admission, onward)    Start     Dose/Rate Route Frequency Ordered Stop   06/20/23 1730  cefTRIAXone (ROCEPHIN) 2 g in sodium chloride 0.9 %  100 mL IVPB  Status:  Discontinued        2 g 200 mL/hr over 30 Minutes Intravenous Every 24 hours 06/20/23 1724 06/21/23 1221       Subjective: Pt requesting to have foley catheter removed.  He says he has been urinating normally at home and suffers from "shy bladder"   Objective: Vitals:   06/22/23 0347 06/22/23 0600 06/22/23 0750 06/22/23 1425  BP: (!) 141/82 (!) 141/82 (!) 150/91 100/74  Pulse: (!) 110 (!) 110 (!) 110 (!) 121  Resp: 20  18 17   Temp: 98.5 F (36.9 C) 98.4 F (36.9 C) 98.3 F (36.8 C) 99.5 F (37.5 C)  TempSrc: Oral  Oral Oral  SpO2: 96%  97% 96%  Weight:      Height:        Intake/Output Summary (Last 24 hours) at 06/22/2023 1548 Last data filed at 06/22/2023 1000 Gross per 24 hour  Intake 1661.2 ml  Output 4525 ml  Net -2863.8 ml   Filed Weights   06/20/23 1702  Weight: 91.9 kg    Examination:  General exam: appears stated age. Awake, alert, NAD.   Respiratory system: no increased work of breathing. Cardiovascular system: normal s1, s2 sounds. No m/r/g  Gastrointestinal system: ND/NT, no HSM, normal BS.  Central nervous system: no focal findings.  Extremities: age related joint deformities  Skin: age related changes seen.  Psychiatry: normal affect. Cooperative and pleasant.   Data Reviewed: I have personally reviewed following labs and imaging studies  CBC: Recent Labs  Lab 06/20/23 1725 06/21/23 0406 06/22/23 0338  WBC 11.4* 8.9 7.4  NEUTROABS 5.6  --   --   HGB 13.1 11.7* 11.2*  HCT 39.5 36.3* 34.1*  MCV 91.9 92.4 92.4  PLT 247 226 203   Basic Metabolic Panel: Recent Labs  Lab 06/20/23 1725 06/21/23 0406 06/22/23 0338  NA 135 136 132*  K 4.2 4.2 4.2  CL 103 105 106  CO2 21* 22 21*  GLUCOSE 193* 148* 182*  BUN 23 20 17   CREATININE 1.61* 1.26* 1.13  CALCIUM 8.7* 8.1* 8.0*  MG  --  1.6* 1.8  PHOS  --  3.3  --    GFR: Estimated Creatinine Clearance: 52.5 mL/min (by C-G formula based on SCr of 1.13 mg/dL). Liver Function Tests: Recent Labs  Lab 06/20/23 1725 06/21/23 0406  AST 32 23  ALT 27 26  ALKPHOS 87 80  BILITOT 0.9 0.8  PROT 6.9  6.3*  ALBUMIN 3.4* 3.2*   No results for input(s): "LIPASE", "AMYLASE" in the last 168 hours. No results for input(s): "AMMONIA" in the last 168 hours. Coagulation Profile: Recent Labs  Lab 06/20/23 1725  INR 1.0   Cardiac Enzymes: No results for input(s): "CKTOTAL", "CKMB", "CKMBINDEX", "TROPONINI" in the last 168 hours. BNP (last 3 results) No results for input(s): "PROBNP" in the last 8760 hours. HbA1C: Recent Labs    06/20/23 2321  HGBA1C 7.2*   CBG: Recent Labs  Lab 06/21/23 1114 06/21/23 1618 06/21/23 2122 06/22/23 0743 06/22/23 1118  GLUCAP 187* 188* 208* 149* 283*   Lipid Profile: No results for input(s): "CHOL", "HDL", "LDLCALC", "TRIG", "CHOLHDL", "LDLDIRECT" in the last 72 hours. Thyroid Function Tests: No results for input(s): "TSH", "T4TOTAL", "FREET4", "T3FREE", "THYROIDAB" in the last 72 hours. Anemia Panel: No results for input(s): "VITAMINB12", "FOLATE", "FERRITIN", "TIBC", "IRON", "RETICCTPCT" in the last 72 hours. Sepsis Labs: Recent Labs  Lab 06/20/23 1725 06/20/23 2005 06/20/23 2321  LATICACIDVEN 3.9* 2.4*  1.8    Recent Results (from the past 240 hour(s))  Resp panel by RT-PCR (RSV, Flu A&B, Covid) Anterior Nasal Swab     Status: None   Collection Time: 06/20/23  5:25 PM   Specimen: Anterior Nasal Swab  Result Value Ref Range Status   SARS Coronavirus 2 by RT PCR NEGATIVE NEGATIVE Final    Comment: (NOTE) SARS-CoV-2 target nucleic acids are NOT DETECTED.  The SARS-CoV-2 RNA is generally detectable in upper respiratory specimens during the acute phase of infection. The lowest concentration of SARS-CoV-2 viral copies this assay can detect is 138 copies/mL. A negative result does not preclude SARS-Cov-2 infection and should not be used as the sole basis for treatment or other patient management decisions. A negative result may occur with  improper specimen collection/handling, submission of specimen other than nasopharyngeal swab,  presence of viral mutation(s) within the areas targeted by this assay, and inadequate number of viral copies(<138 copies/mL). A negative result must be combined with clinical observations, patient history, and epidemiological information. The expected result is Negative.  Fact Sheet for Patients:  BloggerCourse.com  Fact Sheet for Healthcare Providers:  SeriousBroker.it  This test is no t yet approved or cleared by the [place] FDA and  has been authorized for detection and/or diagnosis of SARS-CoV-2 by FDA under an Emergency Use Authorization (EUA). This EUA will remain  in effect (meaning this test can be used) for the duration of the COVID-19 declaration under Section 564(b)(1) of the Act, 21 U.S.C.section 360bbb-3(b)(1), unless the authorization is terminated  or revoked sooner.       Influenza A by PCR NEGATIVE NEGATIVE Final   Influenza B by PCR NEGATIVE NEGATIVE Final    Comment: (NOTE) The Xpert Xpress SARS-CoV-2/FLU/RSV plus assay is intended as an aid in the diagnosis of influenza from Nasopharyngeal swab specimens and should not be used as a sole basis for treatment. Nasal washings and aspirates are unacceptable for Xpert Xpress SARS-CoV-2/FLU/RSV testing.  Fact Sheet for Patients: BloggerCourse.com  Fact Sheet for Healthcare Providers: SeriousBroker.it  This test is not yet approved or cleared by the [place] FDA and has been authorized for detection and/or diagnosis of SARS-CoV-2 by FDA under an Emergency Use Authorization (EUA). This EUA will remain in effect (meaning this test can be used) for the duration of the COVID-19 declaration under Section 564(b)(1) of the Act, 21 U.S.C. section 360bbb-3(b)(1), unless the authorization is terminated or revoked.     Resp Syncytial Virus by PCR NEGATIVE NEGATIVE Final    Comment: (NOTE) Fact Sheet for  Patients: BloggerCourse.com  Fact Sheet for Healthcare Providers: SeriousBroker.it  This test is not yet approved or cleared by the [place] FDA and has been authorized for detection and/or diagnosis of SARS-CoV-2 by FDA under an Emergency Use Authorization (EUA). This EUA will remain in effect (meaning this test can be used) for the duration of the COVID-19 declaration under Section 564(b)(1) of the Act, 21 U.S.C. section 360bbb-3(b)(1), unless the authorization is terminated or revoked.  Performed at [facility], [address], [place], [place] [address]   Blood Culture (routine x 2)     Status: None (Preliminary result)   Collection Time: 06/20/23  5:56 PM   Specimen: BLOOD RIGHT ARM  Result Value Ref Range Status   Specimen Description BLOOD RIGHT ARM  Final   Special Requests   Final    BOTTLES DRAWN AEROBIC AND ANAEROBIC Blood Culture results may not be optimal due to an inadequate volume  of blood received in culture bottles   Culture   Final    NO GROWTH 2 DAYS Performed at [facility], [address], [place], [place] [address]    Report Status PENDING  Incomplete  Blood Culture (routine x 2)     Status: None (Preliminary result)   Collection Time: 06/20/23  6:51 PM   Specimen: BLOOD  Result Value Ref Range Status   Specimen Description BLOOD BLOOD LEFT HAND  Final   Special Requests Blood Culture adequate volume  Final   Culture   Final    NO GROWTH 2 DAYS Performed at [facility], [address], [place], [place] [address]    Report Status PENDING  Incomplete    Radiology Studies: ECHOCARDIOGRAM COMPLETE  Result Date: 06/21/2023    ECHOCARDIOGRAM REPORT   Patient Name:   Stephen Rogers. Date of Exam: 06/21/2023 Medical Rec #:  [id number]            Height:       66.0 in Accession #:    [id number]           Weight:       202.6 lb Date of Birth:  1941/10/11           BSA:          2.011 m Patient Age:    81  years             BP:           168/87 mmHg Patient Gender: M                    HR:           99 bpm. [facility] Procedure: 2D Echo, Cardiac Doppler and Color Doppler Indications:    Pulmonary Embolus I26.09  History:        Patient has prior history of Echocardiogram examinations, most                 recent 11/09/2022. Signs/Symptoms:Murmur; Risk                 Factors:Hypertension, Diabetes and Dyslipidemia.  Sonographer:    Celesta Gentile RCS Referring Phys: [id number] OLADAPO ADEFESO IMPRESSIONS  1. Left ventricular ejection fraction, by estimation, is >75%. The left ventricle has hyperdynamic function. The left ventricle has no regional wall motion abnormalities. Left ventricular diastolic parameters are consistent with Grade I diastolic dysfunction (impaired relaxation).  2. Right ventricular systolic function is normal. The right ventricular size is normal. There is mildly elevated pulmonary artery systolic pressure.  3. The mitral valve is normal in structure. No evidence of mitral valve regurgitation. No evidence of mitral stenosis.  4. The tricuspid valve is abnormal.  5. The aortic valve is tricuspid. Aortic valve regurgitation is not visualized. No aortic stenosis is present.  6. The inferior vena cava is normal in size with greater than 50% respiratory variability, suggesting right atrial pressure of 3 mmHg. FINDINGS  Left Ventricle: Left ventricular ejection fraction, by estimation, is >75%. The left ventricle has hyperdynamic function. The left ventricle has no regional wall motion abnormalities. Definity contrast agent was given IV to delineate the left ventricular endocardial borders. The left ventricular internal cavity size was normal in size. There is no left ventricular hypertrophy. Left ventricular diastolic parameters are consistent with Grade I diastolic dysfunction (impaired relaxation). Normal  left ventricular filling pressure. Right Ventricle: The right ventricular size is  normal.  Right vetricular wall thickness was not well visualized. Right ventricular systolic function is normal. There is mildly elevated pulmonary artery systolic pressure. The tricuspid regurgitant velocity  is 2.96 m/s, and with an assumed right atrial pressure of 3 mmHg, the estimated right ventricular systolic pressure is 38.0 mmHg. Left Atrium: Left atrial size was normal in size. Right Atrium: Right atrial size was normal in size. Pericardium: There is no evidence of pericardial effusion. Mitral Valve: The mitral valve is normal in structure. No evidence of mitral valve regurgitation. No evidence of mitral valve stenosis. Tricuspid Valve: The tricuspid valve is abnormal. Tricuspid valve regurgitation is mild . No evidence of tricuspid stenosis. Aortic Valve: The aortic valve is tricuspid. Aortic valve regurgitation is not visualized. No aortic stenosis is present. Aortic valve mean gradient measures 3.0 mmHg. Aortic valve peak gradient measures 7.8 mmHg. Aortic valve area, by VTI measures 2.87 cm. Pulmonic Valve: The pulmonic valve was not well visualized. Pulmonic valve regurgitation is mild. No evidence of pulmonic stenosis. Aorta: The aortic root is normal in size and structure. Venous: The inferior vena cava is normal in size with greater than 50% respiratory variability, suggesting right atrial pressure of 3 mmHg. IAS/Shunts: The interatrial septum was not well visualized.  LEFT VENTRICLE PLAX 2D LVIDd:         3.90 cm   Diastology LVIDs:         2.40 cm   LV e' medial:    7.18 cm/s LV PW:         0.90 cm   LV E/e' medial:  10.9 LV IVS:        1.00 cm   LV e' lateral:   7.40 cm/s LVOT diam:     2.00 cm   LV E/e' lateral: 10.6 LV SV:         61 LV SV Index:   30 LVOT Area:     3.14 cm  RIGHT VENTRICLE RV S prime:     19.30 cm/s TAPSE (M-mode): 2.0 cm LEFT ATRIUM             Index        RIGHT ATRIUM           Index LA diam:        3.80 cm 1.89 cm/m   RA Area:     19.40 cm LA Vol (A2C):   54.9 ml 27.30  ml/m  RA Volume:   45.00 ml  22.38 ml/m LA Vol (A4C):   59.7 ml 29.69 ml/m LA Biplane Vol: 57.5 ml 28.60 ml/m  AORTIC VALVE AV Area (Vmax):    2.54 cm AV Area (Vmean):   3.08 cm AV Area (VTI):     2.87 cm AV Vmax:           139.95 cm/s AV Vmean:          75.698 cm/s AV VTI:            0.213 m AV Peak Grad:      7.8 mmHg AV Mean Grad:      3.0 mmHg LVOT Vmax:         113.00 cm/s LVOT Vmean:        74.100 cm/s LVOT VTI:          0.195 m LVOT/AV VTI ratio: 0.91  AORTA Ao Root diam: 3.60 cm MITRAL VALVE                TRICUSPID VALVE MV Area (PHT): 2.83  cm     TR Peak grad:   35.0 mmHg MV Decel Time: 268 msec     TR Vmax:        296.00 cm/s MV E velocity: 78.40 cm/s MV A velocity: 111.00 cm/s  SHUNTS MV E/A ratio:  0.71         Systemic VTI:  0.20 m                             Systemic Diam: 2.00 cm Dina Rich MD Electronically signed by Dina Rich MD Signature Date/Time: 06/21/2023/4:43:06 PM    Final    US Venous Img Lower Bilateral (DVT)  Result Date: 06/21/2023 CLINICAL DATA:  Bilateral lower extremity edema and pulmonary embolism. EXAM: BILATERAL LOWER EXTREMITY VENOUS DOPPLER ULTRASOUND TECHNIQUE: Gray-scale sonography with graded compression, as well as color Doppler and duplex ultrasound were performed to evaluate the lower extremity deep venous systems from the level of the common femoral vein and including the common femoral, femoral, profunda femoral, popliteal and calf veins including the posterior tibial, peroneal and gastrocnemius veins when visible. The superficial great saphenous vein was also interrogated. Spectral Doppler was utilized to evaluate flow at rest and with distal augmentation maneuvers in the common femoral, femoral and popliteal veins. COMPARISON:  None Available. FINDINGS: RIGHT LOWER EXTREMITY Common Femoral Vein: No evidence of thrombus. Normal compressibility, respiratory phasicity and response to augmentation. Saphenofemoral Junction: No evidence of thrombus.  Normal compressibility and flow on color Doppler imaging. Profunda Femoral Vein: No evidence of thrombus. Normal compressibility and flow on color Doppler imaging. Femoral Vein: No evidence of thrombus. Normal compressibility, respiratory phasicity and response to augmentation. Popliteal Vein: No evidence of thrombus. Normal compressibility, respiratory phasicity and response to augmentation. Calf Veins: Thrombus in visualized posterior tibial and peroneal veins. The anterior tibial vein appears to be normally patent. Superficial Great Saphenous Vein: No evidence of thrombus. Normal compressibility. Venous Reflux:  None. Other Findings: No evidence of superficial thrombophlebitis or abnormal fluid collection. LEFT LOWER EXTREMITY Common Femoral Vein: No evidence of thrombus. Normal compressibility, respiratory phasicity and response to augmentation. Saphenofemoral Junction: No evidence of thrombus. Normal compressibility and flow on color Doppler imaging. Profunda Femoral Vein: No evidence of thrombus. Normal compressibility and flow on color Doppler imaging. Femoral Vein: No evidence of thrombus. Normal compressibility, respiratory phasicity and response to augmentation. Popliteal Vein: No evidence of thrombus. Normal compressibility, respiratory phasicity and response to augmentation. Calf Veins: Thrombus in visualized posterior tibial and peroneal veins. The anterior tibial vein appears to be normally patent. Superficial Great Saphenous Vein: No evidence of thrombus. Normal compressibility. Venous Reflux:  None. Other Findings: No evidence of superficial thrombophlebitis or abnormal fluid collection. IMPRESSION: Bilateral calf vein DVT involving the posterior tibial and peroneal veins. Electronically Signed   By: [name] M.D.   On: 06/21/2023 09:26   CT Chest W Contrast  Result Date: 06/20/2023 CLINICAL DATA:  Shortness of breath. EXAM: CT CHEST WITH CONTRAST TECHNIQUE: Multidetector CT imaging of the  chest was performed during intravenous contrast administration. RADIATION DOSE REDUCTION: This exam was performed according to the departmental dose-optimization program which includes automated exposure control, adjustment of the mA and/or kV according to patient size and/or use of iterative reconstruction technique. CONTRAST:  60mL OMNIPAQUE IOHEXOL 300 MG/ML  SOLN COMPARISON:  Chest radiograph dated 06/20/2023. FINDINGS: Cardiovascular: There is no cardiomegaly or pericardial effusion. There is 3 vessel coronary vascular calcification. Mild atherosclerotic calcification of the  thoracic aorta. No aneurysmal dilatation or dissection. Evaluation of the pulmonary arteries is limited due to respiratory motion and timing of the contrast and suboptimal opacification. There is apparent filling defects in the distal subsegmental branches of the right lower lobe (92/6 as well as an area of filling defect in the central and lobar right upper lobe pulmonary artery (62 and 59/6). These are concerning for possible pulmonary emboli. Dedicated CTA PE study or V/Q scan may provide better evaluation. No CT evidence of right heart straining. Mediastinum/Nodes: No hilar or mediastinal adenopathy. The esophagus and the thyroid gland are grossly unremarkable. No mediastinal fluid collection. Lungs/Pleura: Bibasilar linear atelectasis/scarring. No focal consolidation, pleural effusion, or pneumothorax. The central airways are patent. Upper Abdomen: Cholecystectomy. Partially visualized probable bilateral renal cysts. Musculoskeletal: Osteopenia with degenerative changes of the spine. No acute osseous pathology. IMPRESSION: 1. Findings concerning for possible pulmonary emboli in the right upper and lower lobes. Dedicated CTA PE study or V/Q scan may provide better evaluation. 2. No focal consolidation or pleural effusion. 3.  Aortic Atherosclerosis (ICD10-I70.0). These results were called by telephone at the time of interpretation on  06/20/2023 at 9:01 pm to provider [facility] ZAMMIT , who verbally acknowledged these results. Electronically Signed   By: Elgie Collard M.D.   On: 06/20/2023 21:09   CT Head Wo Contrast  Result Date: 06/20/2023 CLINICAL DATA:  Generalized weakness, altered level of consciousness, memory loss EXAM: CT HEAD WITHOUT CONTRAST TECHNIQUE: Contiguous axial images were obtained from the base of the skull through the vertex without intravenous contrast. RADIATION DOSE REDUCTION: This exam was performed according to the departmental dose-optimization program which includes automated exposure control, adjustment of the mA and/or kV according to patient size and/or use of iterative reconstruction technique. COMPARISON:  None Available. FINDINGS: Evaluation is slightly limited due to patient motion throughout the exam. Brain: No acute infarct or hemorrhage. Focal hypodensities within the basal ganglia and right thalamus consistent with chronic lacunar infarcts. Lateral ventricles and remaining midline structures are unremarkable. No acute extra-axial fluid collections. No mass effect. Vascular: No hyperdense vessel or unexpected calcification. Skull: Normal. Negative for fracture or focal lesion. Sinuses/Orbits: No acute finding. Other: None. IMPRESSION: 1. No acute intracranial process. Electronically Signed   By: Sharlet Salina M.D.   On: 06/20/2023 20:53   DG Chest Port 1 View  Result Date: 06/20/2023 CLINICAL DATA:  Possible sepsis EXAM: PORTABLE CHEST 1 VIEW COMPARISON:  04/30/2019 FINDINGS: Low lung volumes. No focal opacity or pleural effusion. Normal cardiac size. No pneumothorax IMPRESSION: Low lung volumes. Electronically Signed   By: Jasmine Pang M.D.   On: 06/20/2023 18:57    Scheduled Meds:  apixaban  10 mg Oral BID   Followed by   Melene Muller ON 06/29/2023] apixaban  5 mg Oral BID   atorvastatin  40 mg Oral Daily   feeding supplement (GLUCERNA SHAKE)  237 mL Oral TID BM   hydrALAZINE  100 mg Oral TID    insulin aspart  0-9 Units Subcutaneous TID WC   isosorbide mononitrate  60 mg Oral Daily   losartan  100 mg Oral Daily   Continuous Infusions:  magnesium sulfate bolus IVPB      LOS: 1 day   Time spent: 35 minutes  Tyquisha Sharps Laural Benes, MD [facility]  If 7PM-7AM, please contact night-coverage [email or web address] 06/22/2023, 3:48 PM

## 2023-06-22 NOTE — Progress Notes (Signed)
ANTICOAGULATION CONSULT NOTE -  Pharmacy Consult for heparin => eliquis Indication: new pulmonary embolus  No Known Allergies  Patient Measurements: Height: 5\' 6"  (167.6 cm) Weight: 91.9 kg (202 lb 9.6 oz) IBW/kg (Calculated) : 63.8 Heparin Dosing Weight: 83.4 kg  Vital Signs: Temp: 98.3 F (36.8 C) (07/31 0750) Temp Source: Oral (07/31 0750) BP: 150/91 (07/31 0750) Pulse Rate: 110 (07/31 0750)  Labs: Recent Labs    06/20/23 1725 06/21/23 0406 06/21/23 1715 06/22/23 0338  HGB 13.1 11.7*  --  11.2*  HCT 39.5 36.3*  --  34.1*  PLT 247 226  --  203  APTT 23* 166*  --   --   LABPROT 13.5  --   --   --   INR 1.0  --   --   --   HEPARINUNFRC  --  >1.10* 1.06* 0.39  CREATININE 1.61* 1.26*  --  1.13    Estimated Creatinine Clearance: 52.5 mL/min (by C-G formula based on SCr of 1.13 mg/dL).   Medical History: Past Medical History:  Diagnosis Date   CKD (chronic kidney disease), stage III (HCC)    Diabetes mellitus    DVT (deep venous thrombosis) (HCC) 10/14/2016   Extremity edema 09/2016   Hyperlipemia    Hypertension     Assessment: Patient is an 22 yom who presented to ED with generalized weakness and appearing more altered per the family. CT with concern for PE, no heart strain noted. Pharmacy has been consulted for heparin dosing. Pt is tachycardic with HR >100, satting 81-96% on 2L [place], on room air before.   No PTA anticoagulants documented, limited medical history in chart but is stated to have had a DVT in 2017. Baseline hgb is wnl at 13.1, plts wnl at 247.  Venous US of legs show bilateral calf vein DVT. Physician now wants to transition to po DOAC. .  Goal of Therapy:  Heparin level 0.3-0.7 units/ml Monitor platelets by anticoagulation protocol: Yes   Plan:  D/C heparin Eliquis 10mg  po bid x 7 days, then 5mg  po BID Educate on eliquis Continue to monitor H&H and platelets  Elder Cyphers, BS Pharm D, BCPS Clinical Pharmacist 06/22/2023,8:19 AM

## 2023-06-22 NOTE — Plan of Care (Signed)
  Problem: Education: Goal: Knowledge of General Education information will improve Description: Including pain rating scale, medication(s)/side effects and non-pharmacologic comfort measures Outcome: Progressing   Problem: Health Behavior/Discharge Planning: Goal: Ability to manage health-related needs will improve Outcome: Progressing   Problem: Clinical Measurements: Goal: Ability to maintain clinical measurements within normal limits will improve Outcome: Progressing Goal: Will remain free from infection Outcome: Progressing Goal: Diagnostic test results will improve Outcome: Progressing Goal: Respiratory complications will improve Outcome: Progressing Goal: Cardiovascular complication will be avoided Outcome: Progressing   Problem: Activity: Goal: Risk for activity intolerance will decrease Outcome: Progressing   Problem: Nutrition: Goal: Adequate nutrition will be maintained Outcome: Progressing   Problem: Coping: Goal: Level of anxiety will decrease Outcome: Progressing   Problem: Elimination: Goal: Will not experience complications related to bowel motility Outcome: Progressing   Problem: Pain Managment: Goal: General experience of comfort will improve Outcome: Progressing   Problem: Safety: Goal: Ability to remain free from injury will improve Outcome: Progressing   Problem: Skin Integrity: Goal: Risk for impaired skin integrity will decrease Outcome: Progressing   Problem: Education: Goal: Ability to describe self-care measures that may prevent or decrease complications (Diabetes Survival Skills Education) will improve Outcome: Progressing Goal: Individualized Educational Video(s) Outcome: Progressing   Problem: Coping: Goal: Ability to adjust to condition or change in health will improve Outcome: Progressing   Problem: Fluid Volume: Goal: Ability to maintain a balanced intake and output will improve Outcome: Progressing   Problem: Health  Behavior/Discharge Planning: Goal: Ability to identify and utilize available resources and services will improve Outcome: Progressing Goal: Ability to manage health-related needs will improve Outcome: Progressing   Problem: Metabolic: Goal: Ability to maintain appropriate glucose levels will improve Outcome: Progressing   Problem: Nutritional: Goal: Maintenance of adequate nutrition will improve Outcome: Progressing Goal: Progress toward achieving an optimal weight will improve Outcome: Progressing   Problem: Skin Integrity: Goal: Risk for impaired skin integrity will decrease Outcome: Progressing   Problem: Tissue Perfusion: Goal: Adequacy of tissue perfusion will improve Outcome: Progressing   Problem: Elimination: Goal: Will not experience complications related to urinary retention Outcome: Not Progressing

## 2023-06-22 NOTE — Progress Notes (Signed)
Removed foley per order, patient tolerated well 700cc of red colored urine disposed of and placed in intake/output record.

## 2023-06-22 NOTE — Progress Notes (Signed)
ANTICOAGULATION CONSULT NOTE - Follow Up Consult  Pharmacy Consult for heparin Indication: pulmonary embolus  Labs: Recent Labs    06/20/23 1725 06/21/23 0406 06/21/23 1715 06/22/23 0338  HGB 13.1 11.7*  --  11.2*  HCT 39.5 36.3*  --  34.1*  PLT 247 226  --  203  APTT 23* 166*  --   --   LABPROT 13.5  --   --   --   INR 1.0  --   --   --   HEPARINUNFRC  --  >1.10* 1.06* 0.39  CREATININE 1.61* 1.26*  --  1.13    Assessment/Plan:  81yo male therapeutic on heparin after rate change. Will continue infusion at current rate of 1000 units/hr and confirm stable with additional level.  Vernard Gambles, PharmD, BCPS 06/22/2023 5:08 AM

## 2023-06-22 NOTE — ED Provider Notes (Signed)
[facility] Provider Note   CSN: [id number] Arrival date & time: 06/20/23  1657     History  Chief Complaint  Patient presents with   Altered Mental Status    Stephen KMETZ Sr. is a 82 y.o. male.  Patient family states that the patient is more weak than usual.  He has a history of diabetes and hyperlipidemia  The history is provided by the patient and medical records. No language interpreter was used.  Weakness Severity:  Moderate Onset quality:  Sudden Timing:  Constant Progression:  Worsening Chronicity:  New Context: not alcohol use   Relieved by:  Nothing Worsened by:  Nothing Ineffective treatments:  None tried Associated symptoms: no abdominal pain, no chest pain, no cough, no diarrhea, no frequency, no headaches and no seizures        Home Medications Prior to Admission medications   Medication Sig Start Date End Date Taking? Authorizing Provider  aspirin EC 81 MG tablet Take 81 mg by mouth daily.   Yes [provider]  atorvastatin (LIPITOR) 40 MG tablet Take 40 mg by mouth daily.   Yes [provider]  hydrALAZINE (APRESOLINE) 100 MG tablet Take 1 tablet (100 mg total) by mouth 3 (three) times daily. 11/03/22  Yes BranchDorothe Pea, MD  isosorbide mononitrate (IMDUR) 30 MG 24 hr tablet Take 30 mg by mouth daily. 03/24/23  Yes [provider]  losartan-hydrochlorothiazide (HYZAAR) 100-25 MG tablet Take 1 tablet by mouth daily. 05/27/21  Yes [provider]  metFORMIN (GLUCOPHAGE) 500 MG tablet Take 500 mg by mouth 2 (two) times a day. 02/28/19  Yes [provider]  TRADJENTA 5 MG TABS tablet Take 5 mg by mouth daily. 05/27/21  Yes [provider]  verapamil (CALAN-SR) 240 MG CR tablet Take 240 mg by mouth daily. 05/27/21  Yes [provider]  Alcohol Swabs (B-D SINGLE USE SWABS REGULAR) PADS Apply topically. 04/22/23   [provider]  NON FORMULARY Diet Type:  NAS, Consistent  Carbohydrate, bland diet 05/04/19   [provider]  spironolactone (ALDACTONE) 25 MG tablet Take 0.5 tablets (12.5 mg total) by mouth daily. 05/18/23 08/16/23  [name], MD  TRUE METRIX BLOOD GLUCOSE TEST test strip SMARTSIG:Via Meter 05/12/23   [provider]      Allergies    Patient has no known allergies.    Review of Systems   Review of Systems  Constitutional:  Negative for appetite change and fatigue.  HENT:  Negative for congestion, ear discharge and sinus pressure.   Eyes:  Negative for discharge.  Respiratory:  Negative for cough.   Cardiovascular:  Negative for chest pain.  Gastrointestinal:  Negative for abdominal pain and diarrhea.  Genitourinary:  Negative for frequency and hematuria.  Musculoskeletal:  Negative for back pain.  Skin:  Negative for rash.  Neurological:  Positive for weakness. Negative for seizures and headaches.  Psychiatric/Behavioral:  Negative for hallucinations.     Physical Exam Updated Vital Signs BP 100/74   Pulse (!) 121   Temp 99.5 F (37.5 C) (Oral)   Resp 17   Ht 5\' 6"  (1.676 m)   Wt 91.9 kg   SpO2 96%   BMI 32.70 kg/m  Physical Exam Vitals reviewed.  Constitutional:      Appearance: He is well-developed.  HENT:     Head: Normocephalic.     Nose: Nose normal.  Eyes:     General: No scleral icterus.  Conjunctiva/sclera: Conjunctivae normal.  Neck:     Thyroid: No thyromegaly.  Cardiovascular:     Rate and Rhythm: Normal rate and regular rhythm.     Heart sounds: No murmur heard.    No friction rub. No gallop.  Pulmonary:     Breath sounds: No stridor. No wheezing or rales.  Chest:     Chest wall: No tenderness.  Abdominal:     General: There is no distension.     Tenderness: There is no abdominal tenderness. There is no rebound.  Musculoskeletal:        General: Normal range of motion.     Cervical back: Neck supple.  Lymphadenopathy:     Cervical: No cervical adenopathy.  Skin:     Findings: No erythema or rash.  Neurological:     Mental Status: He is alert and oriented to person, place, and time.     Motor: No abnormal muscle tone.     Coordination: Coordination normal.  Psychiatric:        Behavior: Behavior normal.     ED Results / Procedures / Treatments   Labs (all labs ordered are listed, but only abnormal results are displayed) Labs Reviewed  LACTIC ACID, PLASMA - Abnormal; Notable for the following components:      Result Value   Lactic Acid, Venous 3.9 (*)    All other components within normal limits  LACTIC ACID, PLASMA - Abnormal; Notable for the following components:   Lactic Acid, Venous 2.4 (*)    All other components within normal limits  COMPREHENSIVE METABOLIC PANEL - Abnormal; Notable for the following components:   CO2 21 (*)    Glucose, Bld 193 (*)    Creatinine, Ser 1.61 (*)    Calcium 8.7 (*)    Albumin 3.4 (*)    GFR, Estimated 42 (*)    All other components within normal limits  CBC WITH DIFFERENTIAL/PLATELET - Abnormal; Notable for the following components:   WBC 11.4 (*)    Lymphs Abs 4.7 (*)    All other components within normal limits  APTT - Abnormal; Notable for the following components:   aPTT 23 (*)    All other components within normal limits  URINALYSIS, W/ REFLEX TO CULTURE (INFECTION SUSPECTED) - Abnormal; Notable for the following components:   Bacteria, UA RARE (*)    All other components within normal limits  HEPARIN LEVEL (UNFRACTIONATED) - Abnormal; Notable for the following components:   Heparin Unfractionated >1.10 (*)    All other components within normal limits  CBC - Abnormal; Notable for the following components:   RBC 3.93 (*)    Hemoglobin 11.7 (*)    HCT 36.3 (*)    All other components within normal limits  APTT - Abnormal; Notable for the following components:   aPTT 166 (*)    All other components within normal limits  COMPREHENSIVE METABOLIC PANEL - Abnormal; Notable for the following  components:   Glucose, Bld 148 (*)    Creatinine, Ser 1.26 (*)    Calcium 8.1 (*)    Total Protein 6.3 (*)    Albumin 3.2 (*)    GFR, Estimated 57 (*)    All other components within normal limits  MAGNESIUM - Abnormal; Notable for the following components:   Magnesium 1.6 (*)    All other components within normal limits  HEMOGLOBIN A1C - Abnormal; Notable for the following components:   Hgb A1c MFr Bld 7.2 (*)    All  other components within normal limits  GLUCOSE, CAPILLARY - Abnormal; Notable for the following components:   Glucose-Capillary 150 (*)    All other components within normal limits  GLUCOSE, CAPILLARY - Abnormal; Notable for the following components:   Glucose-Capillary 146 (*)    All other components within normal limits  HEPARIN LEVEL (UNFRACTIONATED) - Abnormal; Notable for the following components:   Heparin Unfractionated 1.06 (*)    All other components within normal limits  GLUCOSE, CAPILLARY - Abnormal; Notable for the following components:   Glucose-Capillary 187 (*)    All other components within normal limits  GLUCOSE, CAPILLARY - Abnormal; Notable for the following components:   Glucose-Capillary 188 (*)    All other components within normal limits  CBC - Abnormal; Notable for the following components:   RBC 3.69 (*)    Hemoglobin 11.2 (*)    HCT 34.1 (*)    All other components within normal limits  BASIC METABOLIC PANEL - Abnormal; Notable for the following components:   Sodium 132 (*)    CO2 21 (*)    Glucose, Bld 182 (*)    Calcium 8.0 (*)    All other components within normal limits  GLUCOSE, CAPILLARY - Abnormal; Notable for the following components:   Glucose-Capillary 208 (*)    All other components within normal limits  GLUCOSE, CAPILLARY - Abnormal; Notable for the following components:   Glucose-Capillary 149 (*)    All other components within normal limits  GLUCOSE, CAPILLARY - Abnormal; Notable for the following components:    Glucose-Capillary 283 (*)    All other components within normal limits  RESP PANEL BY RT-PCR (RSV, FLU A&B, COVID)  [id number]  CULTURE, BLOOD (ROUTINE X 2)  CULTURE, BLOOD (ROUTINE X 2)  PROTIME-INR  LACTIC ACID, PLASMA  PHOSPHORUS  MAGNESIUM  HEPARIN LEVEL (UNFRACTIONATED)    EKG EKG Interpretation Date/Time:  Monday June 20 2023 17:08:42 EDT Ventricular Rate:  130 PR Interval:  113 QRS Duration:  103 QT Interval:  305 QTC Calculation: 449 R Axis:   1  Text Interpretation: Sinus tachycardia Atrial premature complex Artifact in lead(s) I III aVL Confirmed by Margarita Grizzle [phone]) on 06/21/2023 10:47:28 AM  Radiology ECHOCARDIOGRAM COMPLETE  Result Date: 06/21/2023    ECHOCARDIOGRAM REPORT   Patient Name:   LACARLOS RESPASS Sr. Date of Exam: 06/21/2023 Medical Rec #:  [id number]            Height:       66.0 in Accession #:    [id number]           Weight:       202.6 lb Date of Birth:  [birth year]           BSA:          2.011 m Patient Age:    44 years             BP:           168/87 mmHg Patient Gender: M                    HR:           99 bpm. [facility] Procedure: 2D Echo, Cardiac Doppler and Color Doppler Indications:    Pulmonary Embolus I26.09  History:        Patient has prior history of Echocardiogram examinations, most  recent 11/09/2022. Signs/Symptoms:Murmur; Risk                 Factors:Hypertension, Diabetes and Dyslipidemia.  Sonographer:    Celesta Gentile RCS Referring Phys: [id number] OLADAPO ADEFESO IMPRESSIONS  1. Left ventricular ejection fraction, by estimation, is >75%. The left ventricle has hyperdynamic function. The left ventricle has no regional wall motion abnormalities. Left ventricular diastolic parameters are consistent with Grade I diastolic dysfunction (impaired relaxation).  2. Right ventricular systolic function is normal. The right ventricular size is normal. There is mildly elevated pulmonary artery systolic pressure.  3. The mitral  valve is normal in structure. No evidence of mitral valve regurgitation. No evidence of mitral stenosis.  4. The tricuspid valve is abnormal.  5. The aortic valve is tricuspid. Aortic valve regurgitation is not visualized. No aortic stenosis is present.  6. The inferior vena cava is normal in size with greater than 50% respiratory variability, suggesting right atrial pressure of 3 mmHg. FINDINGS  Left Ventricle: Left ventricular ejection fraction, by estimation, is >75%. The left ventricle has hyperdynamic function. The left ventricle has no regional wall motion abnormalities. Definity contrast agent was given IV to delineate the left ventricular endocardial borders. The left ventricular internal cavity size was normal in size. There is no left ventricular hypertrophy. Left ventricular diastolic parameters are consistent with Grade I diastolic dysfunction (impaired relaxation). Normal  left ventricular filling pressure. Right Ventricle: The right ventricular size is normal. Right vetricular wall thickness was not well visualized. Right ventricular systolic function is normal. There is mildly elevated pulmonary artery systolic pressure. The tricuspid regurgitant velocity  is 2.96 m/s, and with an assumed right atrial pressure of 3 mmHg, the estimated right ventricular systolic pressure is 38.0 mmHg. Left Atrium: Left atrial size was normal in size. Right Atrium: Right atrial size was normal in size. Pericardium: There is no evidence of pericardial effusion. Mitral Valve: The mitral valve is normal in structure. No evidence of mitral valve regurgitation. No evidence of mitral valve stenosis. Tricuspid Valve: The tricuspid valve is abnormal. Tricuspid valve regurgitation is mild . No evidence of tricuspid stenosis. Aortic Valve: The aortic valve is tricuspid. Aortic valve regurgitation is not visualized. No aortic stenosis is present. Aortic valve mean gradient measures 3.0 mmHg. Aortic valve peak gradient measures 7.8  mmHg. Aortic valve area, by VTI measures 2.87 cm. Pulmonic Valve: The pulmonic valve was not well visualized. Pulmonic valve regurgitation is mild. No evidence of pulmonic stenosis. Aorta: The aortic root is normal in size and structure. Venous: The inferior vena cava is normal in size with greater than 50% respiratory variability, suggesting right atrial pressure of 3 mmHg. IAS/Shunts: The interatrial septum was not well visualized.  LEFT VENTRICLE PLAX 2D LVIDd:         3.90 cm   Diastology LVIDs:         2.40 cm   LV e' medial:    7.18 cm/s LV PW:         0.90 cm   LV E/e' medial:  10.9 LV IVS:        1.00 cm   LV e' lateral:   7.40 cm/s LVOT diam:     2.00 cm   LV E/e' lateral: 10.6 LV SV:         61 LV SV Index:   30 LVOT Area:     3.14 cm  RIGHT VENTRICLE RV S prime:     19.30 cm/s TAPSE (M-mode): 2.0 cm LEFT ATRIUM  Index        RIGHT ATRIUM           Index LA diam:        3.80 cm 1.89 cm/m   RA Area:     19.40 cm LA Vol (A2C):   54.9 ml 27.30 ml/m  RA Volume:   45.00 ml  22.38 ml/m LA Vol (A4C):   59.7 ml 29.69 ml/m LA Biplane Vol: 57.5 ml 28.60 ml/m  AORTIC VALVE AV Area (Vmax):    2.54 cm AV Area (Vmean):   3.08 cm AV Area (VTI):     2.87 cm AV Vmax:           139.95 cm/s AV Vmean:          75.698 cm/s AV VTI:            0.213 m AV Peak Grad:      7.8 mmHg AV Mean Grad:      3.0 mmHg LVOT Vmax:         113.00 cm/s LVOT Vmean:        74.100 cm/s LVOT VTI:          0.195 m LVOT/AV VTI ratio: 0.91  AORTA Ao Root diam: 3.60 cm MITRAL VALVE                TRICUSPID VALVE MV Area (PHT): 2.83 cm     TR Peak grad:   35.0 mmHg MV Decel Time: 268 msec     TR Vmax:        296.00 cm/s MV E velocity: 78.40 cm/s MV A velocity: 111.00 cm/s  SHUNTS MV E/A ratio:  0.71         Systemic VTI:  0.20 m                             Systemic Diam: 2.00 cm Dina Rich MD Electronically signed by Dina Rich MD Signature Date/Time: 06/21/2023/4:43:06 PM    Final    US Venous Img Lower Bilateral  (DVT)  Result Date: 06/21/2023 CLINICAL DATA:  Bilateral lower extremity edema and pulmonary embolism. EXAM: BILATERAL LOWER EXTREMITY VENOUS DOPPLER ULTRASOUND TECHNIQUE: Gray-scale sonography with graded compression, as well as color Doppler and duplex ultrasound were performed to evaluate the lower extremity deep venous systems from the level of the common femoral vein and including the common femoral, femoral, profunda femoral, popliteal and calf veins including the posterior tibial, peroneal and gastrocnemius veins when visible. The superficial great saphenous vein was also interrogated. Spectral Doppler was utilized to evaluate flow at rest and with distal augmentation maneuvers in the common femoral, femoral and popliteal veins. COMPARISON:  None Available. FINDINGS: RIGHT LOWER EXTREMITY Common Femoral Vein: No evidence of thrombus. Normal compressibility, respiratory phasicity and response to augmentation. Saphenofemoral Junction: No evidence of thrombus. Normal compressibility and flow on color Doppler imaging. Profunda Femoral Vein: No evidence of thrombus. Normal compressibility and flow on color Doppler imaging. Femoral Vein: No evidence of thrombus. Normal compressibility, respiratory phasicity and response to augmentation. Popliteal Vein: No evidence of thrombus. Normal compressibility, respiratory phasicity and response to augmentation. Calf Veins: Thrombus in visualized posterior tibial and peroneal veins. The anterior tibial vein appears to be normally patent. Superficial Great Saphenous Vein: No evidence of thrombus. Normal compressibility. Venous Reflux:  None. Other Findings: No evidence of superficial thrombophlebitis or abnormal fluid collection. LEFT LOWER EXTREMITY Common Femoral Vein: No evidence of thrombus. Normal compressibility,  respiratory phasicity and response to augmentation. Saphenofemoral Junction: No evidence of thrombus. Normal compressibility and flow on color Doppler  imaging. Profunda Femoral Vein: No evidence of thrombus. Normal compressibility and flow on color Doppler imaging. Femoral Vein: No evidence of thrombus. Normal compressibility, respiratory phasicity and response to augmentation. Popliteal Vein: No evidence of thrombus. Normal compressibility, respiratory phasicity and response to augmentation. Calf Veins: Thrombus in visualized posterior tibial and peroneal veins. The anterior tibial vein appears to be normally patent. Superficial Great Saphenous Vein: No evidence of thrombus. Normal compressibility. Venous Reflux:  None. Other Findings: No evidence of superficial thrombophlebitis or abnormal fluid collection. IMPRESSION: Bilateral calf vein DVT involving the posterior tibial and peroneal veins. Electronically Signed   By: [name] M.D.   On: 06/21/2023 09:26   CT Chest W Contrast  Result Date: 06/20/2023 CLINICAL DATA:  Shortness of breath. EXAM: CT CHEST WITH CONTRAST TECHNIQUE: Multidetector CT imaging of the chest was performed during intravenous contrast administration. RADIATION DOSE REDUCTION: This exam was performed according to the departmental dose-optimization program which includes automated exposure control, adjustment of the mA and/or kV according to patient size and/or use of iterative reconstruction technique. CONTRAST:  60mL OMNIPAQUE IOHEXOL 300 MG/ML  SOLN COMPARISON:  Chest radiograph dated 06/20/2023. FINDINGS: Cardiovascular: There is no cardiomegaly or pericardial effusion. There is 3 vessel coronary vascular calcification. Mild atherosclerotic calcification of the thoracic aorta. No aneurysmal dilatation or dissection. Evaluation of the pulmonary arteries is limited due to respiratory motion and timing of the contrast and suboptimal opacification. There is apparent filling defects in the distal subsegmental branches of the right lower lobe (92/6 as well as an area of filling defect in the central and lobar right upper lobe  pulmonary artery (62 and 59/6). These are concerning for possible pulmonary emboli. Dedicated CTA PE study or V/Q scan may provide better evaluation. No CT evidence of right heart straining. Mediastinum/Nodes: No hilar or mediastinal adenopathy. The esophagus and the thyroid gland are grossly unremarkable. No mediastinal fluid collection. Lungs/Pleura: Bibasilar linear atelectasis/scarring. No focal consolidation, pleural effusion, or pneumothorax. The central airways are patent. Upper Abdomen: Cholecystectomy. Partially visualized probable bilateral renal cysts. Musculoskeletal: Osteopenia with degenerative changes of the spine. No acute osseous pathology. IMPRESSION: 1. Findings concerning for possible pulmonary emboli in the right upper and lower lobes. Dedicated CTA PE study or V/Q scan may provide better evaluation. 2. No focal consolidation or pleural effusion. 3.  Aortic Atherosclerosis (ICD10-I70.0). These results were called by telephone at the time of interpretation on 06/20/2023 at 9:01 pm to provider [facility] Ashwin Tibbs , who verbally acknowledged these results. Electronically Signed   By: Elgie Collard M.D.   On: 06/20/2023 21:09   CT Head Wo Contrast  Result Date: 06/20/2023 CLINICAL DATA:  Generalized weakness, altered level of consciousness, memory loss EXAM: CT HEAD WITHOUT CONTRAST TECHNIQUE: Contiguous axial images were obtained from the base of the skull through the vertex without intravenous contrast. RADIATION DOSE REDUCTION: This exam was performed according to the departmental dose-optimization program which includes automated exposure control, adjustment of the mA and/or kV according to patient size and/or use of iterative reconstruction technique. COMPARISON:  None Available. FINDINGS: Evaluation is slightly limited due to patient motion throughout the exam. Brain: No acute infarct or hemorrhage. Focal hypodensities within the basal ganglia and right thalamus consistent with chronic lacunar  infarcts. Lateral ventricles and remaining midline structures are unremarkable. No acute extra-axial fluid collections. No mass effect. Vascular: No hyperdense vessel or unexpected calcification. Skull:  Normal. Negative for fracture or focal lesion. Sinuses/Orbits: No acute finding. Other: None. IMPRESSION: 1. No acute intracranial process. Electronically Signed   By: Sharlet Salina M.D.   On: 06/20/2023 20:53   DG Chest Port 1 View  Result Date: 06/20/2023 CLINICAL DATA:  Possible sepsis EXAM: PORTABLE CHEST 1 VIEW COMPARISON:  04/30/2019 FINDINGS: Low lung volumes. No focal opacity or pleural effusion. Normal cardiac size. No pneumothorax IMPRESSION: Low lung volumes. Electronically Signed   By: Jasmine Pang M.D.   On: 06/20/2023 18:57    Procedures Procedures    Medications Ordered in ED Medications  feeding supplement (GLUCERNA SHAKE) (GLUCERNA SHAKE) liquid 237 mL (237 mLs Oral Given 06/22/23 0934)  acetaminophen (TYLENOL) tablet 650 mg (has no administration in time range)    Or  acetaminophen (TYLENOL) suppository 650 mg (has no administration in time range)  ondansetron (ZOFRAN) tablet 4 mg (has no administration in time range)    Or  ondansetron (ZOFRAN) injection 4 mg (has no administration in time range)  atorvastatin (LIPITOR) tablet 40 mg (40 mg Oral Given 06/22/23 0921)  hydrALAZINE (APRESOLINE) tablet 100 mg (100 mg Oral Given 06/22/23 0921)  losartan (COZAAR) tablet 100 mg (100 mg Oral Given 06/22/23 0921)  isosorbide mononitrate (IMDUR) 24 hr tablet 60 mg (60 mg Oral Given 06/22/23 0928)  insulin aspart (novoLOG) injection 0-9 Units (5 Units Subcutaneous Given 06/22/23 1334)  perflutren lipid microspheres (DEFINITY) IV suspension (3 mLs Intravenous Given 06/21/23 1457)  apixaban (ELIQUIS) tablet 10 mg (10 mg Oral Given 06/22/23 6073)    Followed by  apixaban (ELIQUIS) tablet 5 mg (has no administration in time range)  sodium chloride 0.9 % bolus 2,000 mL (0 mLs Intravenous  Stopped 06/20/23 2110)  sodium chloride 0.9 % bolus 1,000 mL (0 mLs Intravenous Stopped 06/20/23 2110)  iohexol (OMNIPAQUE) 300 MG/ML solution 60 mL (60 mLs Intravenous Contrast Given 06/20/23 1944)  heparin bolus via infusion 5,000 Units (5,000 Units Intravenous Bolus from Bag 06/20/23 2200)  magnesium sulfate IVPB 2 g 50 mL (2 g Intravenous New Bag/Given 06/21/23 0917)    ED Course/ Medical Decision Making/ A&P  CRITICAL CARE Performed by: Bethann Berkshire Total critical care time: 40 minutes Critical care time was exclusive of separately billable procedures and treating other patients. Critical care was necessary to treat or prevent imminent or life-threatening deterioration. Critical care was time spent personally by me on the following activities: development of treatment plan with patient and/or surrogate as well as nursing, discussions with consultants, evaluation of patient's response to treatment, examination of patient, obtaining history from patient or surrogate, ordering and performing treatments and interventions, ordering and review of laboratory studies, ordering and review of radiographic studies, pulse oximetry and re-evaluation of patient's condition.                                Medical Decision Making Amount and/or Complexity of Data Reviewed Labs: ordered. Radiology: ordered. ECG/medicine tests: ordered.  Risk Prescription drug management. Decision regarding hospitalization.   Patient with weakness and hypoxia and possible PE.  He will be admitted to medicine and placed on heparin        Final Clinical Impression(s) / ED Diagnoses Final diagnoses:  Hypoxia    Rx / DC Orders ED Discharge Orders     None         Bethann Berkshire, MD 06/22/23 [phone]

## 2023-06-22 NOTE — Progress Notes (Signed)
   06/22/23 1602  Vitals  Temp 99.7 F (37.6 C)  Temp Source Oral  BP 111/67  MAP (mmHg) 79  BP Location Right Arm  BP Method Automatic  Patient Position (if appropriate) Lying  Pulse Rate (!) 122  Resp 18  Level of Consciousness  Level of Consciousness Alert  MEWS COLOR  MEWS Score Color Yellow  Pain Assessment  Pain Scale 0-10  Pain Score 0  MEWS Score  MEWS Temp 0  MEWS Systolic 0  MEWS Pulse 2  MEWS RR 0  MEWS LOC 0  MEWS Score 2  Provider Notification  Provider Name/Title Dr Laural Benes  Date Provider Notified 06/22/23  Time Provider Notified 1602  Method of Notification Page  Notification Reason Critical Result;Other (Comment) (hr 1223)  Provider response See new orders  Date of Provider Response 06/22/23  Time of Provider Response [phone]

## 2023-06-23 ENCOUNTER — Encounter (HOSPITAL_COMMUNITY): Payer: Self-pay

## 2023-06-23 ENCOUNTER — Emergency Department (HOSPITAL_COMMUNITY)
Admission: EM | Admit: 2023-06-23 | Discharge: 2023-06-24 | Disposition: A | Discharge status: Home / Self Care | Payer: Medicare HMO | Source: Home / Self Care | Attending: Emergency Medicine | Admitting: Emergency Medicine

## 2023-06-23 ENCOUNTER — Other Ambulatory Visit: Payer: Self-pay

## 2023-06-23 DIAGNOSIS — Z794 Long term (current) use of insulin: Secondary | ICD-10-CM

## 2023-06-23 DIAGNOSIS — I2699 Other pulmonary embolism without acute cor pulmonale: Secondary | ICD-10-CM | POA: Diagnosis not present

## 2023-06-23 DIAGNOSIS — Z7901 Long term (current) use of anticoagulants: Secondary | ICD-10-CM | POA: Diagnosis not present

## 2023-06-23 DIAGNOSIS — R944 Abnormal results of kidney function studies: Secondary | ICD-10-CM | POA: Insufficient documentation

## 2023-06-23 DIAGNOSIS — Z86711 Personal history of pulmonary embolism: Secondary | ICD-10-CM | POA: Insufficient documentation

## 2023-06-23 DIAGNOSIS — E1165 Type 2 diabetes mellitus with hyperglycemia: Secondary | ICD-10-CM | POA: Diagnosis not present

## 2023-06-23 DIAGNOSIS — I951 Orthostatic hypotension: Secondary | ICD-10-CM

## 2023-06-23 DIAGNOSIS — R2689 Other abnormalities of gait and mobility: Secondary | ICD-10-CM | POA: Insufficient documentation

## 2023-06-23 DIAGNOSIS — Z7984 Long term (current) use of oral hypoglycemic drugs: Secondary | ICD-10-CM | POA: Diagnosis not present

## 2023-06-23 DIAGNOSIS — R2681 Unsteadiness on feet: Secondary | ICD-10-CM | POA: Insufficient documentation

## 2023-06-23 DIAGNOSIS — R5381 Other malaise: Secondary | ICD-10-CM | POA: Insufficient documentation

## 2023-06-23 DIAGNOSIS — I10 Essential (primary) hypertension: Secondary | ICD-10-CM | POA: Diagnosis not present

## 2023-06-23 DIAGNOSIS — N179 Acute kidney failure, unspecified: Secondary | ICD-10-CM | POA: Diagnosis not present

## 2023-06-23 DIAGNOSIS — R531 Weakness: Secondary | ICD-10-CM | POA: Diagnosis not present

## 2023-06-23 DIAGNOSIS — E872 Acidosis, unspecified: Secondary | ICD-10-CM | POA: Diagnosis not present

## 2023-06-23 LAB — MAGNESIUM: Magnesium: 2.1 mg/dL (ref 1.7–2.4)

## 2023-06-23 LAB — COMPREHENSIVE METABOLIC PANEL
ALT: 33 U/L (ref 0–44)
AST: 40 U/L (ref 15–41)
Albumin: 3.2 g/dL — ABNORMAL LOW (ref 3.5–5.0)
Alkaline Phosphatase: 79 U/L (ref 38–126)
Anion gap: 9 (ref 5–15)
BUN: 28 mg/dL — ABNORMAL HIGH (ref 8–23)
CO2: 24 mmol/L (ref 22–32)
Calcium: 8.4 mg/dL — ABNORMAL LOW (ref 8.9–10.3)
Chloride: 100 mmol/L (ref 98–111)
Creatinine, Ser: 1.51 mg/dL — ABNORMAL HIGH (ref 0.61–1.24)
GFR, Estimated: 46 mL/min — ABNORMAL LOW (ref >60)
Glucose, Bld: 202 mg/dL — ABNORMAL HIGH (ref 70–99)
Potassium: 4.3 mmol/L (ref 3.5–5.1)
Sodium: 133 mmol/L — ABNORMAL LOW (ref 135–145)
Total Bilirubin: 0.6 mg/dL (ref 0.3–1.2)
Total Protein: 6.3 g/dL — ABNORMAL LOW (ref 6.5–8.1)

## 2023-06-23 LAB — CBC WITH DIFFERENTIAL/PLATELET
Abs Immature Granulocytes: 0.04 10*3/uL (ref 0.00–0.07)
Basophils Absolute: 0 10*3/uL (ref 0.0–0.1)
Basophils Relative: 0 %
Eosinophils Absolute: 0.1 10*3/uL (ref 0.0–0.5)
Eosinophils Relative: 1 %
HCT: 37.2 % — ABNORMAL LOW (ref 39.0–52.0)
Hemoglobin: 11.4 g/dL — ABNORMAL LOW (ref 13.0–17.0)
Immature Granulocytes: 0 %
Lymphocytes Relative: 18 %
Lymphs Abs: 1.6 10*3/uL (ref 0.7–4.0)
MCH: 30.4 pg (ref 26.0–34.0)
MCHC: 30.6 g/dL (ref 30.0–36.0)
MCV: 99.2 fL (ref 80.0–100.0)
Monocytes Absolute: 0.7 10*3/uL (ref 0.1–1.0)
Monocytes Relative: 7 %
Neutro Abs: 6.9 10*3/uL (ref 1.7–7.7)
Neutrophils Relative %: 74 %
Platelets: 254 10*3/uL (ref 150–400)
RBC: 3.75 MIL/uL — ABNORMAL LOW (ref 4.22–5.81)
RDW: 14.3 % (ref 11.5–15.5)
WBC: 9.3 10*3/uL (ref 4.0–10.5)
nRBC: 0 % (ref 0.0–0.2)

## 2023-06-23 LAB — CBG MONITORING, ED: Glucose-Capillary: 163 mg/dL — ABNORMAL HIGH (ref 70–99)

## 2023-06-23 LAB — TROPONIN I (HIGH SENSITIVITY)
Troponin I (High Sensitivity): 7 ng/L (ref <18)
Troponin I (High Sensitivity): 7 ng/L (ref <18)

## 2023-06-23 LAB — GLUCOSE, CAPILLARY
Glucose-Capillary: 141 mg/dL — ABNORMAL HIGH (ref 70–99)
Glucose-Capillary: 180 mg/dL — ABNORMAL HIGH (ref 70–99)

## 2023-06-23 MED ORDER — SODIUM CHLORIDE 0.9 % IV SOLN: INTRAVENOUS | Status: DC

## 2023-06-23 MED ORDER — HYDRALAZINE HCL 25 MG PO TABS
25.0000 mg | ORAL_TABLET | Freq: Three times a day (TID) | ORAL | Status: DC
  Administered 2023-06-23 – 2023-06-24 (×2): 25 mg via ORAL
  Filled 2023-06-23 (×2): qty 1

## 2023-06-23 MED ORDER — SODIUM CHLORIDE 0.9 % IV SOLN: INTRAVENOUS | Status: AC

## 2023-06-23 MED ORDER — APIXABAN 5 MG PO TABS
10.0000 mg | ORAL_TABLET | Freq: Two times a day (BID) | ORAL | Status: DC
  Administered 2023-06-23 – 2023-06-24 (×2): 10 mg via ORAL
  Filled 2023-06-23 (×2): qty 2

## 2023-06-23 MED ORDER — ISOSORBIDE MONONITRATE ER 30 MG PO TB24
30.0000 mg | ORAL_TABLET | Freq: Every day | ORAL | Status: DC
  Administered 2023-06-24: 30 mg via ORAL
  Filled 2023-06-23: qty 1

## 2023-06-23 MED ORDER — LACTATED RINGERS IV BOLUS
500.0000 mL | Freq: Once | INTRAVENOUS | Status: AC
  Administered 2023-06-23: 500 mL via INTRAVENOUS

## 2023-06-23 MED ORDER — APIXABAN 5 MG PO TABS: 5.0000 mg | ORAL_TABLET | Freq: Two times a day (BID) | ORAL | Status: DC

## 2023-06-23 MED ORDER — APIXABAN 5 MG PO TABS: ORAL_TABLET | ORAL | 1 refills | Status: DC

## 2023-06-23 MED ORDER — INSULIN ASPART 100 UNIT/ML IJ SOLN: 0.0000 [IU] | Freq: Every day | INTRAMUSCULAR | Status: DC

## 2023-06-23 MED ORDER — VERAPAMIL HCL ER 120 MG PO TBCR
120.0000 mg | EXTENDED_RELEASE_TABLET | Freq: Every day | ORAL | Status: DC
  Filled 2023-06-23 (×4): qty 1

## 2023-06-23 MED ORDER — INSULIN ASPART 100 UNIT/ML IJ SOLN
0.0000 [IU] | Freq: Three times a day (TID) | INTRAMUSCULAR | Status: DC
  Administered 2023-06-24: 8 [IU] via SUBCUTANEOUS
  Administered 2023-06-24: 3 [IU] via SUBCUTANEOUS
  Filled 2023-06-23 (×2): qty 1

## 2023-06-23 MED ORDER — HYDRALAZINE HCL 100 MG PO TABS: 50.0000 mg | ORAL_TABLET | Freq: Three times a day (TID) | ORAL | Status: DC

## 2023-06-23 NOTE — Discharge Instructions (Addendum)
Apixaban instructions: Take 2 twice daily thru 8/6, then on 8/7 start 1 tablet twice daily and continue this dose for at least 3 months Discuss with your primary care provider when it would be ok to stop apixaban   IMPORTANT INFORMATION: PAY CLOSE ATTENTION   PHYSICIAN DISCHARGE INSTRUCTIONS  Follow with Primary care provider  [name], MD  and other consultants as instructed by your Hospitalist Physician  SEEK MEDICAL CARE OR RETURN TO EMERGENCY ROOM IF SYMPTOMS COME BACK, WORSEN OR NEW PROBLEM DEVELOPS   Please note: You were cared for by a hospitalist during your hospital stay. Every effort will be made to forward records to your primary care provider.  You can request that your primary care provider send for your hospital records if they have not received them.  Once you are discharged, your primary care physician will handle any further medical issues. Please note that NO REFILLS for any discharge medications will be authorized once you are discharged, as it is imperative that you return to your primary care physician (or establish a relationship with a primary care physician if you do not have one) for your post hospital discharge needs so that they can reassess your need for medications and monitor your lab values.  Please get a complete blood count and chemistry panel checked by your Primary MD at your next visit, and again as instructed by your Primary MD.  Get Medicines reviewed and adjusted: Please take all your medications with you for your next visit with your Primary MD  Laboratory/radiological data: Please request your Primary MD to go over all hospital tests and procedure/radiological results at the follow up, please ask your primary care provider to get all Hospital records sent to his/her office.  In some cases, they will be blood work, cultures and biopsy results pending at the time of your discharge. Please request that your primary care provider follow up on  these results.  If you are diabetic, please bring your blood sugar readings with you to your follow up appointment with primary care.    Please call and make your follow up appointments as soon as possible.    Also Note the following: If you experience worsening of your admission symptoms, develop shortness of breath, life threatening emergency, suicidal or homicidal thoughts you must seek medical attention immediately by calling 911 or calling your MD immediately  if symptoms less severe.  You must read complete instructions/literature along with all the possible adverse reactions/side effects for all the Medicines you take and that have been prescribed to you. Take any new Medicines after you have completely understood and accpet all the possible adverse reactions/side effects.   Do not drive when taking Pain medications or sleeping medications (Benzodiazepines)  Do not take more than prescribed Pain, Sleep and Anxiety Medications. It is not advisable to combine anxiety,sleep and pain medications without talking with your primary care practitioner  Special Instructions: If you have smoked or chewed Tobacco  in the last 2 yrs please stop smoking, stop any regular Alcohol  and or any Recreational drug use.  Wear Seat belts while driving.  Do not drive if taking any narcotic, mind altering or controlled substances or recreational drugs or alcohol.

## 2023-06-23 NOTE — ED Notes (Signed)
Lab stated blood hemolyzed IV access established and labs redrawn  New rainbow sent to lab

## 2023-06-23 NOTE — Evaluation (Signed)
Physical Therapy Evaluation Patient Details Name: Stephen GOHL Sr. MRN: [id number] DOB: 1940/12/26 Today's Date: 06/23/2023  History of Present Illness  [name] is a 82 y.o. male with medical history significant of T2DM, hypertension, hyperlipidemia, CKD stage III who presents to the emergency department due to several days of generalized weakness and altered mental status.  Patient was unable to provide history at bedside, history was obtained from ED physician and ED medical record.  Per report, mother was concerned due to change in patient's baseline status, EMS was activated and patient was taken to the ED for further evaluation and management.   Clinical Impression  Patient functioning near base other than having to lean on armrest of chair when using SPC and required use of RW for safety.  Patient demonstrated good return for transfers and ambulation using RW and tolerated sitting up in chair after therapy.  PLAN:  Patient to be discharged home today and discharged from acute physical therapy to care of nursing for ambulation as tolerated for length of stay with recommendations stated below         If plan is discharge home, recommend the following: A little help with walking and/or transfers;A little help with bathing/dressing/bathroom;Help with stairs or ramp for entrance;Assistance with cooking/housework   Can travel by private vehicle        Equipment Recommendations None recommended by PT  Recommendations for Other Services       Functional Status Assessment Patient has had a recent decline in their functional status and demonstrates the ability to make significant improvements in function in a reasonable and predictable amount of time.     Precautions / Restrictions Precautions Precautions: Fall Restrictions Weight Bearing Restrictions: No      Mobility  Bed Mobility Overal bed mobility: Modified Independent                  Transfers Overall  transfer level: Needs assistance Equipment used: Straight cane, Rolling walker (2 wheels) Transfers: Sit to/from Stand, Bed to chair/wheelchair/BSC Sit to Stand: Min guard   Step pivot transfers: Min guard       General transfer comment: had to lean on arm rest of chair using SPC due to weakness, safer using RW    Ambulation/Gait Ambulation/Gait assistance: Supervision, Min guard Gait Distance (Feet): 65 Feet Assistive device: Rolling walker (2 wheels) Gait Pattern/deviations: Decreased step length - right, Decreased step length - left, Decreased stride length, Trunk flexed Gait velocity: decreased     General Gait Details: slightly labored slow cadence without loss of balance using RW, limited mostly due to fatigue  Stairs            Wheelchair Mobility     Tilt Bed    Modified Rankin (Stroke Patients Only)       Balance Overall balance assessment: Needs assistance Sitting-balance support: Feet supported, No upper extremity supported Sitting balance-Leahy Scale: Good Sitting balance - Comments: seated at EOB   Standing balance support: Reliant on assistive device for balance, During functional activity, Single extremity supported Standing balance-Leahy Scale: Poor Standing balance comment: fair/poor using RW, fair/good using RW                             Pertinent Vitals/Pain Pain Assessment Pain Assessment: No/denies pain    Home Living Family/patient expects to be discharged to:: Private residence Living Arrangements: Children Available Help at Discharge: Family;Available 24 hours/day Type of  Home: House Home Access: Level entry       Home Layout: One level Home Equipment: Agricultural consultant (2 wheels);Cane - single point;BSC/3in1;Shower seat;Shower seat - built in      Prior Function Prior Level of Function : Needs assist;Driving       Physical Assist : Mobility (physical);ADLs (physical) Mobility (physical):  Transfers;Gait;Stairs;Bed mobility   Mobility Comments: household and short distanced community ambulator using SPC, occasionally drives ADLs Comments: Assisted by family     Hand Dominance        Extremity/Trunk Assessment   Upper Extremity Assessment Upper Extremity Assessment: Overall WFL for tasks assessed    Lower Extremity Assessment Lower Extremity Assessment: Generalized weakness    Cervical / Trunk Assessment Cervical / Trunk Assessment: Kyphotic  Communication   Communication: No difficulties  Cognition Arousal/Alertness: Awake/alert Behavior During Therapy: WFL for tasks assessed/performed Overall Cognitive Status: Within Functional Limits for tasks assessed                                          General Comments      Exercises     Assessment/Plan    PT Assessment All further PT needs can be met in the next venue of care  PT Problem List Decreased strength;Decreased activity tolerance;Decreased balance;Decreased mobility       PT Treatment Interventions      PT Goals (Current goals can be found in the Care Plan section)  Acute Rehab PT Goals Patient Stated Goal: return home with family to assist PT Goal Formulation: With patient Time For Goal Achievement: 06/23/23 Potential to Achieve Goals: Good    Frequency       Co-evaluation               AM-PAC PT "6 Clicks" Mobility  Outcome Measure Help needed turning from your back to your side while in a flat bed without using bedrails?: None Help needed moving from lying on your back to sitting on the side of a flat bed without using bedrails?: None Help needed moving to and from a bed to a chair (including a wheelchair)?: A Little Help needed standing up from a chair using your arms (e.g., wheelchair or bedside chair)?: A Little Help needed to walk in hospital room?: A Little Help needed climbing 3-5 steps with a railing? : A Little 6 Click Score: 20    End of Session    Activity Tolerance: Patient tolerated treatment well;Patient limited by fatigue Patient left: in chair;with call bell/phone within reach Nurse Communication: Mobility status PT Visit Diagnosis: Unsteadiness on feet (R26.81);Other abnormalities of gait and mobility (R26.89);Muscle weakness (generalized) (M62.81)    Time: 8469-6295 PT Time Calculation (min) (ACUTE ONLY): 18 min   Charges:   PT Evaluation $PT Eval Low Complexity: 1 Low PT Treatments $Therapeutic Activity: 8-22 mins PT General Charges $$ ACUTE PT VISIT: 1 Visit         11:22 AM, 06/23/23 Ocie Bob, MPT Physical Therapist with [facility] 336 [phone] office [phone] mobile phone

## 2023-06-23 NOTE — Progress Notes (Signed)
Nsg Discharge Note  Admit Date:  06/20/2023 Discharge date: 06/23/2023   Stephen Revering Sr. to be D/C'd Home per MD order.  AVS completed. Patient/caregiver able to verbalize understanding.  Discharge Medication: Allergies as of 06/23/2023   No Known Allergies      Medication List     STOP taking these medications    aspirin EC 81 MG tablet   spironolactone 25 MG tablet Commonly known as: ALDACTONE       TAKE these medications    apixaban 5 MG Tabs tablet Commonly known as: ELIQUIS 2 po BID thru 8/6, then 1 po BID   atorvastatin 40 MG tablet Commonly known as: LIPITOR Take 40 mg by mouth daily.   B-D SINGLE USE SWABS REGULAR Pads Apply topically.   hydrALAZINE 100 MG tablet Commonly known as: APRESOLINE Take 0.5 tablets (50 mg total) by mouth 3 (three) times daily. What changed: how much to take   isosorbide mononitrate 30 MG 24 hr tablet Commonly known as: IMDUR Take 30 mg by mouth daily.   losartan-hydrochlorothiazide 100-25 MG tablet Commonly known as: HYZAAR Take 1 tablet by mouth daily.   metFORMIN 500 MG tablet Commonly known as: GLUCOPHAGE Take 500 mg by mouth 2 (two) times a day.   NON FORMULARY Diet Type:  NAS, Consistent Carbohydrate, bland diet   Tradjenta 5 MG Tabs tablet Generic drug: linagliptin Take 5 mg by mouth daily.   True Metrix Blood Glucose Test test strip Generic drug: glucose blood SMARTSIG:Via Meter   verapamil 240 MG CR tablet Commonly known as: CALAN-SR Take 240 mg by mouth daily.        Discharge Assessment: Vitals:   06/23/23 0433 06/23/23 1000  BP: 136/80 (!) 140/82  Pulse: 96 (!) 116  Resp: 16   Temp: 99.1 F (37.3 C)   SpO2: 97% 100%   Skin clean, dry and intact without evidence of skin break down, no evidence of skin tears noted. IV catheter discontinued intact. Site without signs and symptoms of complications - no redness or edema noted at insertion site, patient denies c/o pain - only slight  tenderness at site.  Dressing with slight pressure applied.  D/c Instructions-Education: Discharge instructions given to patient/family with verbalized understanding. D/c education completed with patient/family including follow up instructions, medication list, d/c activities limitations if indicated, with other d/c instructions as indicated by MD - patient able to verbalize understanding, all questions fully answered. Patient instructed to return to ED, call 911, or call MD for any changes in condition.  Patient escorted via WC, and D/C home via private auto.  Laurena Spies, RN 06/23/2023 11:00 AM

## 2023-06-23 NOTE — Care Management Important Message (Signed)
Important Message  Patient Details  Name: Stephen SHIPPEE Sr. MRN: [id number] Date of Birth: Dec 23, 1940   Medicare Important Message Given:  N/A - LOS <3 / Initial given by admissions     Corey Harold 06/23/2023, 11:21 AM

## 2023-06-23 NOTE — ED Triage Notes (Signed)
Pt came from home via EMS  Pt was discharged today Was in hospital for PE Family brought pt home. Pt requested to sit outside, pt remained in the heat for over and hour, then family was unable to get pt up and walk  EMS BGL 194 120/70  Pt has no complaints of pain.

## 2023-06-23 NOTE — Consult Note (Signed)
Patient Demographics  Stephen Rogers, is a 82 y.o. male   MRN: [id number]   DOB - October 11, 1941  Admit Date - 06/23/2023    Outpatient Primary MD for the patient is Felecia Shelling, Wayland Salinas, MD  Consult requested in the Hospital by Rondel Baton, MD, On 06/23/2023    Reason for consult : Evaluate for admission    With History of -  Past Medical History:  Diagnosis Date   CKD (chronic kidney disease), stage III (HCC)    Diabetes mellitus    DVT (deep venous thrombosis) (HCC) 10/14/2016   Extremity edema 09/2016   Hyperlipemia    Hypertension       Past Surgical History:  Procedure Laterality Date   CHOLECYSTECTOMY N/A 04/23/2019   Procedure: CHOLECYSTECTOMY;  Surgeon: [name], MD;  Location: [facility];  Service: General;  Laterality: N/A;   COLONOSCOPY  02/2010   Dr. [name]: diverticuolosis, multiple polyps removed, cecal adenoma.    COLONOSCOPY WITH PROPOFOL N/A 04/19/2019   Procedure: COLONOSCOPY WITH PROPOFOL;  Surgeon: [name], MD;  Location: [facility];  Service: Endoscopy;  Laterality: N/A;   IR GENERIC HISTORICAL  10/19/2016   IR PERC CHOLECYSTOSTOMY 10/19/2016 [name], MD [facility]   IR GENERIC HISTORICAL  10/25/2016   IR CATHETER TUBE CHANGE 10/25/2016 [name], MD [facility]   IR GENERIC HISTORICAL  11/30/2016   IR RADIOLOGIST EVAL & MGMT 11/30/2016 [facility]   PARTIAL COLECTOMY N/A 04/23/2019   Procedure: PARTIAL COLECTOMY;  Surgeon: [name], MD;  Location: [facility];  Service: General;  Laterality: N/A;   POLYPECTOMY  04/19/2019   Procedure: POLYPECTOMY;  Surgeon: [name], MD;  Location: [facility];  Service: Endoscopy;;  polyp at recto sigmoid    in for   Chief Complaint  Patient presents with   Weakness     HPI  [name]  is a 82 y.o. male, with medical history significant of T2DM, hypertension, hyperlipidemia,  CKD stage III who was admitted to [facility] for generalized weakness, altered mental status, workup significant for acute PE, and DVT, patient was discharged home today with Eliquis, he was seen by PT prior to discharge where recommendation has been made for home health. -Was brought back by EMS as he has difficulty getting up the first step and getting into his house, patient lives with son, multiple grandkids, upon discharge she was discharged with a other son which is a close to his sister so she can help to take care of him, son he works most of the day, patient was so weak, not able to keep going into the house, where he was lowered to the ground, patient was sitting outside for an hour prior to attempting to go inside, no head strike, no fall, no trauma, he was helped to the ground. -ED his workup significant for creatinine up to 1.5, yesterday's labs showing creatinine of 1.3, today 1.2, his blood pressure was noted to be soft while  in ED, and actually he was slightly orthostatic as well, glucose was elevated at 202, triage hospitalist consulted to evaluate for need of admission, plan to reconsult PT/OT/social worker early tomorrow to see if SNF placement is appropriate from ED.  Review of Systems     A full 10 point Review of Systems was done, except as stated above, all other Review of Systems were negative.   Social History Social History   Tobacco Use   Smoking status: Never   Smokeless tobacco: Never  Substance Use Topics   Alcohol use: No    Family History Family History  Problem Relation Age of Onset   Heart attack Other    Tuberculosis Other    Tuberculosis Other    Diabetes type II Father    Hypertension Father    Heart attack Maternal Aunt     Prior to Admission medications   Medication Sig Start Date End Date Taking? Authorizing Provider  Alcohol Swabs (B-D SINGLE USE SWABS REGULAR) PADS Apply topically. 04/22/23   [provider]  apixaban  (ELIQUIS) 5 MG TABS tablet 2 po BID thru 8/6, then 1 po BID 06/23/23   Johnson, Clanford L, MD  atorvastatin (LIPITOR) 40 MG tablet Take 40 mg by mouth daily.    [provider]  hydrALAZINE (APRESOLINE) 100 MG tablet Take 0.5 tablets (50 mg total) by mouth 3 (three) times daily. 06/23/23   Johnson, Clanford L, MD  isosorbide mononitrate (IMDUR) 30 MG 24 hr tablet Take 30 mg by mouth daily. 03/24/23   [provider]  losartan-hydrochlorothiazide (HYZAAR) 100-25 MG tablet Take 1 tablet by mouth daily. 05/27/21   [provider]  metFORMIN (GLUCOPHAGE) 500 MG tablet Take 500 mg by mouth 2 (two) times a day. 02/28/19   [provider]  NON FORMULARY Diet Type:  NAS, Consistent Carbohydrate, bland diet 05/04/19   [provider]  TRADJENTA 5 MG TABS tablet Take 5 mg by mouth daily. 05/27/21   [provider]  TRUE METRIX BLOOD GLUCOSE TEST test strip SMARTSIG:Via Meter 05/12/23   [provider]  verapamil (CALAN-SR) 240 MG CR tablet Take 240 mg by mouth daily. 05/27/21   [provider]    Anti-infectives (From admission, onward)    None       Scheduled Meds:  apixaban  10 mg Oral BID   Followed by   Melene Muller ON 06/29/2023] apixaban  5 mg Oral BID   [START ON 06/24/2023] insulin aspart  0-15 Units Subcutaneous TID WC   insulin aspart  0-5 Units Subcutaneous QHS   Continuous Infusions:  sodium chloride     PRN Meds:.  No Known Allergies  Physical Exam  Vitals  Blood pressure 113/67, pulse 89, temperature 98.2 F (36.8 C), temperature source Oral, resp. rate (!) 21, height 5\' 6"  (1.676 m), weight 92.1 kg, SpO2 91%.   1. General elderly male, laying in bed, no apparent distress  2. Normal affect and insight, Not Suicidal or Homicidal, Awake Alert, Oriented X 3.  3. No F.N deficits, ALL C.Nerves Intact, Strength 5/5 all 4 extremities, Sensation intact all 4 extremities, Plantars down going.  4. Ears and Eyes appear Normal,  Conjunctivae clear, PERRLA. Moist Oral Mucosa.  5. Supple Neck, No JVD, No cervical lymphadenopathy appriciated, No Carotid Bruits.  6. Symmetrical Chest wall movement, Good air movement bilaterally, CTAB.  7. RRR, No Gallops, Rubs or Murmurs, No Parasternal Heave.  8. Positive Bowel Sounds, Abdomen Soft, No tenderness, No organomegaly appriciated,No  rebound -guarding or rigidity.  9.  No Cyanosis, Normal Skin Turgor, No Skin Rash or Bruise.  10. Good muscle tone,  joints appear normal , no effusions, Normal ROM.   Data Review  CBC Recent Labs  Lab 06/20/23 1725 06/21/23 0406 06/22/23 0338 06/23/23 0345 06/23/23 1701  WBC 11.4* 8.9 7.4 8.5 9.3  HGB 13.1 11.7* 11.2* 10.8* 11.4*  HCT 39.5 36.3* 34.1* 33.7* 37.2*  PLT 247 226 203 221 254  MCV 91.9 92.4 92.4 93.1 99.2  MCH 30.5 29.8 30.4 29.8 30.4  MCHC 33.2 32.2 32.8 32.0 30.6  RDW 13.9 13.9 13.8 13.9 14.3  LYMPHSABS 4.7*  --   --   --  1.6  MONOABS 0.9  --   --   --  0.7  EOSABS 0.1  --   --   --  0.1  BASOSABS 0.1  --   --   --  0.0   ------------------------------------------------------------------------------------------------------------------  Chemistries  Recent Labs  Lab 06/20/23 1725 06/21/23 0406 06/22/23 0338 06/23/23 0345 06/23/23 1735  NA 135 136 132* 134* 133*  K 4.2 4.2 4.2 4.5 4.3  CL 103 105 106 103 100  CO2 21* 22 21* 22 24  GLUCOSE 193* 148* 182* 157* 202*  BUN 23 20 17 22  28*  CREATININE 1.61* 1.26* 1.13 1.20 1.51*  CALCIUM 8.7* 8.1* 8.0* 8.2* 8.4*  MG  --  1.6* 1.8 2.2 2.1  AST 32 23  --   --  40  ALT 27 26  --   --  33  ALKPHOS 87 80  --   --  79  BILITOT 0.9 0.8  --   --  0.6   ------------------------------------------------------------------------------------------------------------------ estimated creatinine clearance is 39.4 mL/min (A) (by C-G formula based on SCr of 1.51 mg/dL  (H)). ------------------------------------------------------------------------------------------------------------------ No results for input(s): "TSH", "T4TOTAL", "T3FREE", "THYROIDAB" in the last 72 hours.  Invalid input(s): "FREET3"   Coagulation profile Recent Labs  Lab 06/20/23 1725  INR 1.0   ------------------------------------------------------------------------------------------------------------------- No results for input(s): "DDIMER" in the last 72 hours. -------------------------------------------------------------------------------------------------------------------  Cardiac Enzymes No results for input(s): "CKMB", "TROPONINI", "MYOGLOBIN" in the last 168 hours.  Invalid input(s): "CK" ------------------------------------------------------------------------------------------------------------------ Invalid input(s): "POCBNP"   ---------------------------------------------------------------------------------------------------------------  Urinalysis    Component Value Date/Time   COLORURINE YELLOW 06/20/2023 1824   APPEARANCEUR CLEAR 06/20/2023 1824   LABSPEC 1.014 06/20/2023 1824   PHURINE 7.0 06/20/2023 1824   GLUCOSEU NEGATIVE 06/20/2023 1824   HGBUR NEGATIVE 06/20/2023 1824   BILIRUBINUR NEGATIVE 06/20/2023 1824   KETONESUR NEGATIVE 06/20/2023 1824   PROTEINUR NEGATIVE 06/20/2023 1824   UROBILINOGEN 0.2 01/04/2012 1646   NITRITE NEGATIVE 06/20/2023 1824   LEUKOCYTESUR NEGATIVE 06/20/2023 1824     Imaging results:   No results found.    Assessment & Plan  Active Problems:   AKI (acute kidney injury) (HCC)   Weakness   Physical deconditioning   Orthostasis  Generalized weakness  Physical deconditioning  AKI  Orthostasis -Jent was just discharged this afternoon, was very weak at his house, could not get into 2 steps to walk home, he was helped to the floor, no fall, no head trauma -PT/OT consult was placed, TOC consult was placed as well,  to see if he is appropriate candidate for SNF placement. -Was orthostatic while in ED, so his blood pressure medication has been decreased, please see discussion under hypertension -Creatinine up to 1.5, will start on gentle hydration total of 250 cc over next 5 hours  Acute pulmonary embolism-diagnosis during  recent hospitalization, continue with home dose Eliquis   Essential hypertension, will resume hydralazine at a lower dose 25 mg oral 3 times daily, continue with home dose Imdur, will resume verapamil at a lower dose as well, will hold losartan meanwhile, can be resumed at a lower dose if his creatinine back to baseline tomorrow     AM Labs Ordered, also please review Full Orders  Family Communication: Cussed with sister by phone, 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   Huey Bienenstock M.D on 06/23/2023 at 10:04 PM      Thank you for the consult, we will follow the patient with you in the Hospital.   [facility]   Office  [phone]

## 2023-06-23 NOTE — TOC Transition Note (Signed)
Transition of Care (TOC) - CM/SW Discharge Note   Patient Details  Name: Stephen LEYTON Sr. MRN: [id number] Date of Birth: [birth year]  Transition of Care [facility]) CM/SW Contact:  Annice Needy, LCSW Phone Number: 06/23/2023, 1:59 PM   Clinical Narrative:    PT recommends HHPT. Patient is agreeable to HHPT. No agency preference. Referred to and accepted by [facility]. Patient will be staying at [address] [place], [place], [phone]. Clifton Custard with [facility] notified of address and phone number where patient can be located.      Barriers to Discharge: Continued Medical Work up   Patient Goals and CMS Choice      Discharge Placement                         Discharge Plan and Services Additional resources added to the After Visit Summary for                            [facility] Arranged: PT HH Agency: [facility] Date [facility] Agency Contacted: 06/23/23 Time HH Agency Contacted: [phone] Representative spoke with at [facility] Agency: Clifton Custard  Social Determinants of Health (SDOH) Interventions SDOH Screenings   Food Insecurity: No Food Insecurity (06/21/2023)  Housing: Low Risk  (06/21/2023)  Transportation Needs: No Transportation Needs (06/21/2023)  Utilities: Not At Risk (06/21/2023)  Depression (PHQ2-9): Low Risk  (08/01/2020)  Tobacco Use: Low Risk  (06/20/2023)     Readmission Risk Interventions     No data to display

## 2023-06-23 NOTE — Discharge Summary (Signed)
Physician Discharge Summary  KHARTER REXROTH Sr. XBM:[id number] DOB: [birth year] DOA: 06/20/2023  PCP: [name], MD  Admit date: 06/20/2023 Discharge date: 06/23/2023  Admitted From:  Home  Disposition: Home   Recommendations for Outpatient Follow-up:  Follow up with PCP in 1 weeks Please obtain CBC in 1 week Apixaban - take 10 mg BID thru 8/6, then 5 mg BID  Bleeding precautions while on full anticoagulation therapy  Discharge Condition: STABLE   CODE STATUS: FULL DIET: Resume prior home diet    Brief Hospitalization Summary: Please see all hospital notes, images, labs for full details of the hospitalization. Admission Provider HPI:  82 y.o. male with medical history significant of T2DM, hypertension, hyperlipidemia, CKD stage III who presents to the emergency department due to several days of generalized weakness and altered mental status.  Patient was admitted for acute metabolic encephalopathy in the setting of acute pulmonary embolism as well as possible UTI.  He has been started on heparin drip as well as Rocephin with further studies pending.  Plan to discontinue Rocephin as UA does not look convincing for UTI.  Await further 2D echocardiogram results.  Bilateral lower extremity ultrasound positive for DVT.  Hospital Course by problem list   Acute pulmonary embolism CT chest with contrast showed findings concerning for possible pulmonary emboli in the right upper and lower lobes. Patient was started on IV heparin drip, then on 7/31 transitioned to oral apixaban Bilateral lower extremity ultrasound positive for bilateral DVT Echocardiogram: IMPRESSIONS  1. Left ventricular ejection fraction, by estimation, is >75%. The left  ventricle has hyperdynamic function. The left ventricle has no regional wall motion abnormalities. Left ventricular diastolic parameters are consistent with Grade I diastolic dysfunction (impaired relaxation).   2. Right ventricular systolic  function is normal. The right ventricular size is normal. There is mildly elevated pulmonary artery systolic pressure.   3. The mitral valve is normal in structure. No evidence of mitral valve regurgitation. No evidence of mitral stenosis.   4. The tricuspid valve is abnormal.   5. The aortic valve is tricuspid. Aortic valve regurgitation is not visualized. No aortic stenosis is present.   6. The inferior vena cava is normal in size with greater than 50% respiratory variability, suggesting right atrial pressure of 3 mmHg.    UTI ruled out   Lactic acidosis -resolved This may be due to hypoxia resulting in oxygen requirement No further need to trend   Hypoalbuminemia possibly secondary to mild protein calorie malnutrition Albumin 3.4, protein supplement will be provided   Chronic kidney disease stage IIIa Creatinine 1.61 (creatinine appears to be within baseline range).  Renally adjust medications, avoid nephrotoxic agents/dehydration/hypotension   Type 2 diabetes mellitus with hyperglycemia ISS and hypoglycemia protocol Added novolog 3 units TID with meals>50% eaten  CBG (last 3)  CBG (last 3)  Recent Labs    06/22/23 1635 06/22/23 1941 06/23/23 0723  GLUCAP 125* 194* 141*    Essential hypertension Continue hydralazine, Imdur, losartan, verapamil   Sinus Tachycardia / rebound tachycardia  - HR elevation likely secondary to acute PE - verapamil was not restarted from admission - restarted home verapamil 7/31    Mixed hyperlipidemia Continue Lipitor   Obesity (BMI 32.70) Diet and lifestyle modification   Discharge Diagnoses:  Principal Problem:   Acute pulmonary embolism (HCC) Active Problems:   Mixed hyperlipidemia   CKD (chronic kidney disease), stage III (HCC)   Urinary tract infection   Lactic acidosis   Hypoalbuminemia due to  protein-calorie malnutrition (HCC)   Essential hypertension   Type 2 diabetes mellitus with hyperglycemia (HCC)   Obesity (BMI  30-39.9)   Discharge Instructions:  Allergies as of 06/23/2023   No Known Allergies      Medication List     STOP taking these medications    aspirin EC 81 MG tablet   spironolactone 25 MG tablet Commonly known as: ALDACTONE       TAKE these medications    apixaban 5 MG Tabs tablet Commonly known as: ELIQUIS 2 po BID thru 8/6, then 1 po BID   atorvastatin 40 MG tablet Commonly known as: LIPITOR Take 40 mg by mouth daily.   B-D SINGLE USE SWABS REGULAR Pads Apply topically.   hydrALAZINE 100 MG tablet Commonly known as: APRESOLINE Take 0.5 tablets (50 mg total) by mouth 3 (three) times daily. What changed: how much to take   isosorbide mononitrate 30 MG 24 hr tablet Commonly known as: IMDUR Take 30 mg by mouth daily.   losartan-hydrochlorothiazide 100-25 MG tablet Commonly known as: HYZAAR Take 1 tablet by mouth daily.   metFORMIN 500 MG tablet Commonly known as: GLUCOPHAGE Take 500 mg by mouth 2 (two) times a day.   NON FORMULARY Diet Type:  NAS, Consistent Carbohydrate, bland diet   Tradjenta 5 MG Tabs tablet Generic drug: linagliptin Take 5 mg by mouth daily.   True Metrix Blood Glucose Test test strip Generic drug: glucose blood SMARTSIG:Via Meter   verapamil 240 MG CR tablet Commonly known as: CALAN-SR Take 240 mg by mouth daily.        Follow-up Information     Fanta, Wayland Salinas, MD. Schedule an appointment as soon as possible for a visit in 1 week(s).   Specialty: Internal Medicine Why: Hospital Follow Up Contact information: [address] [place] [address] [phone]                No Known Allergies Allergies as of 06/23/2023   No Known Allergies      Medication List     STOP taking these medications    aspirin EC 81 MG tablet   spironolactone 25 MG tablet Commonly known as: ALDACTONE       TAKE these medications    apixaban 5 MG Tabs tablet Commonly known as: ELIQUIS 2 po BID  thru 8/6, then 1 po BID   atorvastatin 40 MG tablet Commonly known as: LIPITOR Take 40 mg by mouth daily.   B-D SINGLE USE SWABS REGULAR Pads Apply topically.   hydrALAZINE 100 MG tablet Commonly known as: APRESOLINE Take 0.5 tablets (50 mg total) by mouth 3 (three) times daily. What changed: how much to take   isosorbide mononitrate 30 MG 24 hr tablet Commonly known as: IMDUR Take 30 mg by mouth daily.   losartan-hydrochlorothiazide 100-25 MG tablet Commonly known as: HYZAAR Take 1 tablet by mouth daily.   metFORMIN 500 MG tablet Commonly known as: GLUCOPHAGE Take 500 mg by mouth 2 (two) times a day.   NON FORMULARY Diet Type:  NAS, Consistent Carbohydrate, bland diet   Tradjenta 5 MG Tabs tablet Generic drug: linagliptin Take 5 mg by mouth daily.   True Metrix Blood Glucose Test test strip Generic drug: glucose blood SMARTSIG:Via Meter   verapamil 240 MG CR tablet Commonly known as: CALAN-SR Take 240 mg by mouth daily.        Procedures/Studies: ECHOCARDIOGRAM COMPLETE  Result Date: 06/21/2023    ECHOCARDIOGRAM REPORT   Patient Name:  Hartford E Soberanes Sr. Date of Exam: 06/21/2023 Medical Rec #:  [id number]            Height:       66.0 in Accession #:    [id number]           Weight:       202.6 lb Date of Birth:  [birth year]           BSA:          2.011 m Patient Age:    81 years             BP:           168/87 mmHg Patient Gender: M                    HR:           99 bpm. [facility] Procedure: 2D Echo, Cardiac Doppler and Color Doppler Indications:    Pulmonary Embolus I26.09  History:        Patient has prior history of Echocardiogram examinations, most                 recent 11/09/2022. Signs/Symptoms:Murmur; Risk                 Factors:Hypertension, Diabetes and Dyslipidemia.  Sonographer:    Celesta Gentile RCS Referring Phys: [id number] OLADAPO ADEFESO IMPRESSIONS  1. Left ventricular ejection fraction, by estimation, is >75%. The left ventricle  has hyperdynamic function. The left ventricle has no regional wall motion abnormalities. Left ventricular diastolic parameters are consistent with Grade I diastolic dysfunction (impaired relaxation).  2. Right ventricular systolic function is normal. The right ventricular size is normal. There is mildly elevated pulmonary artery systolic pressure.  3. The mitral valve is normal in structure. No evidence of mitral valve regurgitation. No evidence of mitral stenosis.  4. The tricuspid valve is abnormal.  5. The aortic valve is tricuspid. Aortic valve regurgitation is not visualized. No aortic stenosis is present.  6. The inferior vena cava is normal in size with greater than 50% respiratory variability, suggesting right atrial pressure of 3 mmHg. FINDINGS  Left Ventricle: Left ventricular ejection fraction, by estimation, is >75%. The left ventricle has hyperdynamic function. The left ventricle has no regional wall motion abnormalities. Definity contrast agent was given IV to delineate the left ventricular endocardial borders. The left ventricular internal cavity size was normal in size. There is no left ventricular hypertrophy. Left ventricular diastolic parameters are consistent with Grade I diastolic dysfunction (impaired relaxation). Normal  left ventricular filling pressure. Right Ventricle: The right ventricular size is normal. Right vetricular wall thickness was not well visualized. Right ventricular systolic function is normal. There is mildly elevated pulmonary artery systolic pressure. The tricuspid regurgitant velocity  is 2.96 m/s, and with an assumed right atrial pressure of 3 mmHg, the estimated right ventricular systolic pressure is 38.0 mmHg. Left Atrium: Left atrial size was normal in size. Right Atrium: Right atrial size was normal in size. Pericardium: There is no evidence of pericardial effusion. Mitral Valve: The mitral valve is normal in structure. No evidence of mitral valve regurgitation. No  evidence of mitral valve stenosis. Tricuspid Valve: The tricuspid valve is abnormal. Tricuspid valve regurgitation is mild . No evidence of tricuspid stenosis. Aortic Valve: The aortic valve is tricuspid. Aortic valve regurgitation is not visualized. No aortic stenosis is present. Aortic valve mean gradient measures 3.0 mmHg. Aortic valve peak gradient measures  7.8 mmHg. Aortic valve area, by VTI measures 2.87 cm. Pulmonic Valve: The pulmonic valve was not well visualized. Pulmonic valve regurgitation is mild. No evidence of pulmonic stenosis. Aorta: The aortic root is normal in size and structure. Venous: The inferior vena cava is normal in size with greater than 50% respiratory variability, suggesting right atrial pressure of 3 mmHg. IAS/Shunts: The interatrial septum was not well visualized.  LEFT VENTRICLE PLAX 2D LVIDd:         3.90 cm   Diastology LVIDs:         2.40 cm   LV e' medial:    7.18 cm/s LV PW:         0.90 cm   LV E/e' medial:  10.9 LV IVS:        1.00 cm   LV e' lateral:   7.40 cm/s LVOT diam:     2.00 cm   LV E/e' lateral: 10.6 LV SV:         61 LV SV Index:   30 LVOT Area:     3.14 cm  RIGHT VENTRICLE RV S prime:     19.30 cm/s TAPSE (M-mode): 2.0 cm LEFT ATRIUM             Index        RIGHT ATRIUM           Index LA diam:        3.80 cm 1.89 cm/m   RA Area:     19.40 cm LA Vol (A2C):   54.9 ml 27.30 ml/m  RA Volume:   45.00 ml  22.38 ml/m LA Vol (A4C):   59.7 ml 29.69 ml/m LA Biplane Vol: 57.5 ml 28.60 ml/m  AORTIC VALVE AV Area (Vmax):    2.54 cm AV Area (Vmean):   3.08 cm AV Area (VTI):     2.87 cm AV Vmax:           139.95 cm/s AV Vmean:          75.698 cm/s AV VTI:            0.213 m AV Peak Grad:      7.8 mmHg AV Mean Grad:      3.0 mmHg LVOT Vmax:         113.00 cm/s LVOT Vmean:        74.100 cm/s LVOT VTI:          0.195 m LVOT/AV VTI ratio: 0.91  AORTA Ao Root diam: 3.60 cm MITRAL VALVE                TRICUSPID VALVE MV Area (PHT): 2.83 cm     TR Peak grad:   35.0 mmHg MV  Decel Time: 268 msec     TR Vmax:        296.00 cm/s MV E velocity: 78.40 cm/s MV A velocity: 111.00 cm/s  SHUNTS MV E/A ratio:  0.71         Systemic VTI:  0.20 m                             Systemic Diam: 2.00 cm Dina Rich MD Electronically signed by Dina Rich MD Signature Date/Time: 06/21/2023/4:43:06 PM    Final    US Venous Img Lower Bilateral (DVT)  Result Date: 06/21/2023 CLINICAL DATA:  Bilateral lower extremity edema and pulmonary embolism. EXAM: BILATERAL LOWER EXTREMITY VENOUS DOPPLER ULTRASOUND TECHNIQUE: Gray-scale sonography  with graded compression, as well as color Doppler and duplex ultrasound were performed to evaluate the lower extremity deep venous systems from the level of the common femoral vein and including the common femoral, femoral, profunda femoral, popliteal and calf veins including the posterior tibial, peroneal and gastrocnemius veins when visible. The superficial great saphenous vein was also interrogated. Spectral Doppler was utilized to evaluate flow at rest and with distal augmentation maneuvers in the common femoral, femoral and popliteal veins. COMPARISON:  None Available. FINDINGS: RIGHT LOWER EXTREMITY Common Femoral Vein: No evidence of thrombus. Normal compressibility, respiratory phasicity and response to augmentation. Saphenofemoral Junction: No evidence of thrombus. Normal compressibility and flow on color Doppler imaging. Profunda Femoral Vein: No evidence of thrombus. Normal compressibility and flow on color Doppler imaging. Femoral Vein: No evidence of thrombus. Normal compressibility, respiratory phasicity and response to augmentation. Popliteal Vein: No evidence of thrombus. Normal compressibility, respiratory phasicity and response to augmentation. Calf Veins: Thrombus in visualized posterior tibial and peroneal veins. The anterior tibial vein appears to be normally patent. Superficial Great Saphenous Vein: No evidence of thrombus. Normal  compressibility. Venous Reflux:  None. Other Findings: No evidence of superficial thrombophlebitis or abnormal fluid collection. LEFT LOWER EXTREMITY Common Femoral Vein: No evidence of thrombus. Normal compressibility, respiratory phasicity and response to augmentation. Saphenofemoral Junction: No evidence of thrombus. Normal compressibility and flow on color Doppler imaging. Profunda Femoral Vein: No evidence of thrombus. Normal compressibility and flow on color Doppler imaging. Femoral Vein: No evidence of thrombus. Normal compressibility, respiratory phasicity and response to augmentation. Popliteal Vein: No evidence of thrombus. Normal compressibility, respiratory phasicity and response to augmentation. Calf Veins: Thrombus in visualized posterior tibial and peroneal veins. The anterior tibial vein appears to be normally patent. Superficial Great Saphenous Vein: No evidence of thrombus. Normal compressibility. Venous Reflux:  None. Other Findings: No evidence of superficial thrombophlebitis or abnormal fluid collection. IMPRESSION: Bilateral calf vein DVT involving the posterior tibial and peroneal veins. Electronically Signed   By: [name] M.D.   On: 06/21/2023 09:26   CT Chest W Contrast  Result Date: 06/20/2023 CLINICAL DATA:  Shortness of breath. EXAM: CT CHEST WITH CONTRAST TECHNIQUE: Multidetector CT imaging of the chest was performed during intravenous contrast administration. RADIATION DOSE REDUCTION: This exam was performed according to the departmental dose-optimization program which includes automated exposure control, adjustment of the mA and/or kV according to patient size and/or use of iterative reconstruction technique. CONTRAST:  60mL OMNIPAQUE IOHEXOL 300 MG/ML  SOLN COMPARISON:  Chest radiograph dated 06/20/2023. FINDINGS: Cardiovascular: There is no cardiomegaly or pericardial effusion. There is 3 vessel coronary vascular calcification. Mild atherosclerotic calcification of the  thoracic aorta. No aneurysmal dilatation or dissection. Evaluation of the pulmonary arteries is limited due to respiratory motion and timing of the contrast and suboptimal opacification. There is apparent filling defects in the distal subsegmental branches of the right lower lobe (92/6 as well as an area of filling defect in the central and lobar right upper lobe pulmonary artery (62 and 59/6). These are concerning for possible pulmonary emboli. Dedicated CTA PE study or V/Q scan may provide better evaluation. No CT evidence of right heart straining. Mediastinum/Nodes: No hilar or mediastinal adenopathy. The esophagus and the thyroid gland are grossly unremarkable. No mediastinal fluid collection. Lungs/Pleura: Bibasilar linear atelectasis/scarring. No focal consolidation, pleural effusion, or pneumothorax. The central airways are patent. Upper Abdomen: Cholecystectomy. Partially visualized probable bilateral renal cysts. Musculoskeletal: Osteopenia with degenerative changes of the spine. No acute osseous pathology.  IMPRESSION: 1. Findings concerning for possible pulmonary emboli in the right upper and lower lobes. Dedicated CTA PE study or V/Q scan may provide better evaluation. 2. No focal consolidation or pleural effusion. 3.  Aortic Atherosclerosis (ICD10-I70.0). These results were called by telephone at the time of interpretation on 06/20/2023 at 9:01 pm to provider [facility] ZAMMIT , who verbally acknowledged these results. Electronically Signed   By: Elgie Collard M.D.   On: 06/20/2023 21:09   CT Head Wo Contrast  Result Date: 06/20/2023 CLINICAL DATA:  Generalized weakness, altered level of consciousness, memory loss EXAM: CT HEAD WITHOUT CONTRAST TECHNIQUE: Contiguous axial images were obtained from the base of the skull through the vertex without intravenous contrast. RADIATION DOSE REDUCTION: This exam was performed according to the departmental dose-optimization program which includes automated  exposure control, adjustment of the mA and/or kV according to patient size and/or use of iterative reconstruction technique. COMPARISON:  None Available. FINDINGS: Evaluation is slightly limited due to patient motion throughout the exam. Brain: No acute infarct or hemorrhage. Focal hypodensities within the basal ganglia and right thalamus consistent with chronic lacunar infarcts. Lateral ventricles and remaining midline structures are unremarkable. No acute extra-axial fluid collections. No mass effect. Vascular: No hyperdense vessel or unexpected calcification. Skull: Normal. Negative for fracture or focal lesion. Sinuses/Orbits: No acute finding. Other: None. IMPRESSION: 1. No acute intracranial process. Electronically Signed   By: Sharlet Salina M.D.   On: 06/20/2023 20:53   DG Chest Port 1 View  Result Date: 06/20/2023 CLINICAL DATA:  Possible sepsis EXAM: PORTABLE CHEST 1 VIEW COMPARISON:  04/30/2019 FINDINGS: Low lung volumes. No focal opacity or pleural effusion. Normal cardiac size. No pneumothorax IMPRESSION: Low lung volumes. Electronically Signed   By: Jasmine Pang M.D.   On: 06/20/2023 18:57     Subjective: Pt reports that he is feeling well and would like to go home.  No complaints.    Discharge Exam: Vitals:   06/23/23 0433 06/23/23 1000  BP: 136/80 (!) 140/82  Pulse: 96 (!) 116  Resp: 16   Temp: 99.1 F (37.3 C)   SpO2: 97% 100%   Vitals:   06/22/23 1936 06/22/23 2357 06/23/23 0433 06/23/23 1000  BP: 112/65 119/68 136/80 (!) 140/82  Pulse: (!) 116 97 96 (!) 116  Resp: 20 20 16    Temp: 99.8 F (37.7 C) 99.1 F (37.3 C) 99.1 F (37.3 C)   TempSrc: Oral     SpO2: 97% 97% 97% 100%  Weight:      Height:       General: Pt is alert, awake, not in acute distress Cardiovascular: normal S1/S2 +, no rubs, no gallops Respiratory: CTA bilaterally, no wheezing, no rhonchi Abdominal: Soft, NT, ND, bowel sounds + Extremities: no edema, no cyanosis   The results of significant  diagnostics from this hospitalization (including imaging, microbiology, ancillary and laboratory) are listed below for reference.     Microbiology: Recent Results (from the past 240 hour(s))  Resp panel by RT-PCR (RSV, Flu A&B, Covid) Anterior Nasal Swab     Status: None   Collection Time: 06/20/23  5:25 PM   Specimen: Anterior Nasal Swab  Result Value Ref Range Status   SARS Coronavirus 2 by RT PCR NEGATIVE NEGATIVE Final    Comment: (NOTE) SARS-CoV-2 target nucleic acids are NOT DETECTED.  The SARS-CoV-2 RNA is generally detectable in upper respiratory specimens during the acute phase of infection. The lowest concentration of SARS-CoV-2 viral copies this assay can detect is  138 copies/mL. A negative result does not preclude SARS-Cov-2 infection and should not be used as the sole basis for treatment or other patient management decisions. A negative result may occur with  improper specimen collection/handling, submission of specimen other than nasopharyngeal swab, presence of viral mutation(s) within the areas targeted by this assay, and inadequate number of viral copies(<138 copies/mL). A negative result must be combined with clinical observations, patient history, and epidemiological information. The expected result is Negative.  Fact Sheet for Patients:  BloggerCourse.com  Fact Sheet for Healthcare Providers:  SeriousBroker.it  This test is no t yet approved or cleared by the [place] FDA and  has been authorized for detection and/or diagnosis of SARS-CoV-2 by FDA under an Emergency Use Authorization (EUA). This EUA will remain  in effect (meaning this test can be used) for the duration of the COVID-19 declaration under Section 564(b)(1) of the Act, 21 U.S.C.section 360bbb-3(b)(1), unless the authorization is terminated  or revoked sooner.       Influenza A by PCR NEGATIVE NEGATIVE Final   Influenza B by PCR NEGATIVE  NEGATIVE Final    Comment: (NOTE) The Xpert Xpress SARS-CoV-2/FLU/RSV plus assay is intended as an aid in the diagnosis of influenza from Nasopharyngeal swab specimens and should not be used as a sole basis for treatment. Nasal washings and aspirates are unacceptable for Xpert Xpress SARS-CoV-2/FLU/RSV testing.  Fact Sheet for Patients: BloggerCourse.com  Fact Sheet for Healthcare Providers: SeriousBroker.it  This test is not yet approved or cleared by the [place] FDA and has been authorized for detection and/or diagnosis of SARS-CoV-2 by FDA under an Emergency Use Authorization (EUA). This EUA will remain in effect (meaning this test can be used) for the duration of the COVID-19 declaration under Section 564(b)(1) of the Act, 21 U.S.C. section 360bbb-3(b)(1), unless the authorization is terminated or revoked.     Resp Syncytial Virus by PCR NEGATIVE NEGATIVE Final    Comment: (NOTE) Fact Sheet for Patients: BloggerCourse.com  Fact Sheet for Healthcare Providers: SeriousBroker.it  This test is not yet approved or cleared by the [place] FDA and has been authorized for detection and/or diagnosis of SARS-CoV-2 by FDA under an Emergency Use Authorization (EUA). This EUA will remain in effect (meaning this test can be used) for the duration of the COVID-19 declaration under Section 564(b)(1) of the Act, 21 U.S.C. section 360bbb-3(b)(1), unless the authorization is terminated or revoked.  Performed at [facility], [address], [place], [place] [address]   Blood Culture (routine x 2)     Status: None (Preliminary result)   Collection Time: 06/20/23  5:56 PM   Specimen: BLOOD RIGHT ARM  Result Value Ref Range Status   Specimen Description BLOOD RIGHT ARM  Final   Special Requests   Final    BOTTLES DRAWN AEROBIC AND ANAEROBIC Blood Culture results may not be optimal  due to an inadequate volume of blood received in culture bottles   Culture   Final    NO GROWTH 3 DAYS Performed at [facility], [address] [place], [place] [address]    Report Status PENDING  Incomplete  Blood Culture (routine x 2)     Status: None (Preliminary result)   Collection Time: 06/20/23  6:51 PM   Specimen: BLOOD  Result Value Ref Range Status   Specimen Description BLOOD BLOOD LEFT HAND  Final   Special Requests Blood Culture adequate volume  Final   Culture   Final    NO GROWTH  3 DAYS Performed at [facility], [address] [place], [place] [address]    Report Status PENDING  Incomplete     Labs: BNP (last 3 results) No results for input(s): "BNP" in the last 8760 hours. Basic Metabolic Panel: Recent Labs  Lab 06/20/23 1725 06/21/23 0406 06/22/23 0338 06/23/23 0345  NA 135 136 132* 134*  K 4.2 4.2 4.2 4.5  CL 103 105 106 103  CO2 21* 22 21* 22  GLUCOSE 193* 148* 182* 157*  BUN 23 20 17 22   CREATININE 1.61* 1.26* 1.13 1.20  CALCIUM 8.7* 8.1* 8.0* 8.2*  MG  --  1.6* 1.8 2.2  PHOS  --  3.3  --   --    Liver Function Tests: Recent Labs  Lab 06/20/23 1725 06/21/23 0406  AST 32 23  ALT 27 26  ALKPHOS 87 80  BILITOT 0.9 0.8  PROT 6.9 6.3*  ALBUMIN 3.4* 3.2*   No results for input(s): "LIPASE", "AMYLASE" in the last 168 hours. No results for input(s): "AMMONIA" in the last 168 hours. CBC: Recent Labs  Lab 06/20/23 1725 06/21/23 0406 06/22/23 0338 06/23/23 0345  WBC 11.4* 8.9 7.4 8.5  NEUTROABS 5.6  --   --   --   HGB 13.1 11.7* 11.2* 10.8*  HCT 39.5 36.3* 34.1* 33.7*  MCV 91.9 92.4 92.4 93.1  PLT 247 226 203 221   Cardiac Enzymes: No results for input(s): "CKTOTAL", "CKMB", "CKMBINDEX", "TROPONINI" in the last 168 hours. BNP: Invalid input(s): "POCBNP" CBG: Recent Labs  Lab 06/22/23 0743 06/22/23 1118 06/22/23 1635 06/22/23 1941 06/23/23 0723  GLUCAP 149* 283* 125* 194* 141*   D-Dimer No results for input(s): "DDIMER" in  the last 72 hours. Hgb A1c Recent Labs    06/20/23 2321  HGBA1C 7.2*   Lipid Profile No results for input(s): "CHOL", "HDL", "LDLCALC", "TRIG", "CHOLHDL", "LDLDIRECT" in the last 72 hours. Thyroid function studies No results for input(s): "TSH", "T4TOTAL", "T3FREE", "THYROIDAB" in the last 72 hours.  Invalid input(s): "FREET3" Anemia work up No results for input(s): "VITAMINB12", "FOLATE", "FERRITIN", "TIBC", "IRON", "RETICCTPCT" in the last 72 hours. Urinalysis    Component Value Date/Time   COLORURINE YELLOW 06/20/2023 1824   APPEARANCEUR CLEAR 06/20/2023 1824   LABSPEC 1.014 06/20/2023 1824   PHURINE 7.0 06/20/2023 1824   GLUCOSEU NEGATIVE 06/20/2023 1824   HGBUR NEGATIVE 06/20/2023 1824   BILIRUBINUR NEGATIVE 06/20/2023 1824   KETONESUR NEGATIVE 06/20/2023 1824   PROTEINUR NEGATIVE 06/20/2023 1824   UROBILINOGEN 0.2 01/04/2012 1646   NITRITE NEGATIVE 06/20/2023 1824   LEUKOCYTESUR NEGATIVE 06/20/2023 1824   Sepsis Labs Recent Labs  Lab 06/20/23 1725 06/21/23 0406 06/22/23 0338 06/23/23 0345  WBC 11.4* 8.9 7.4 8.5   Microbiology Recent Results (from the past 240 hour(s))  Resp panel by RT-PCR (RSV, Flu A&B, Covid) Anterior Nasal Swab     Status: None   Collection Time: 06/20/23  5:25 PM   Specimen: Anterior Nasal Swab  Result Value Ref Range Status   SARS Coronavirus 2 by RT PCR NEGATIVE NEGATIVE Final    Comment: (NOTE) SARS-CoV-2 target nucleic acids are NOT DETECTED.  The SARS-CoV-2 RNA is generally detectable in upper respiratory specimens during the acute phase of infection. The lowest concentration of SARS-CoV-2 viral copies this assay can detect is 138 copies/mL. A negative result does not preclude SARS-Cov-2 infection and should not be used as the sole basis for treatment or other patient management decisions. A negative result may occur with  improper specimen  collection/handling, submission of specimen other than nasopharyngeal swab, presence of  viral mutation(s) within the areas targeted by this assay, and inadequate number of viral copies(<138 copies/mL). A negative result must be combined with clinical observations, patient history, and epidemiological information. The expected result is Negative.  Fact Sheet for Patients:  BloggerCourse.com  Fact Sheet for Healthcare Providers:  SeriousBroker.it  This test is no t yet approved or cleared by the [place] FDA and  has been authorized for detection and/or diagnosis of SARS-CoV-2 by FDA under an Emergency Use Authorization (EUA). This EUA will remain  in effect (meaning this test can be used) for the duration of the COVID-19 declaration under Section 564(b)(1) of the Act, 21 U.S.C.section 360bbb-3(b)(1), unless the authorization is terminated  or revoked sooner.       Influenza A by PCR NEGATIVE NEGATIVE Final   Influenza B by PCR NEGATIVE NEGATIVE Final    Comment: (NOTE) The Xpert Xpress SARS-CoV-2/FLU/RSV plus assay is intended as an aid in the diagnosis of influenza from Nasopharyngeal swab specimens and should not be used as a sole basis for treatment. Nasal washings and aspirates are unacceptable for Xpert Xpress SARS-CoV-2/FLU/RSV testing.  Fact Sheet for Patients: BloggerCourse.com  Fact Sheet for Healthcare Providers: SeriousBroker.it  This test is not yet approved or cleared by the [place] FDA and has been authorized for detection and/or diagnosis of SARS-CoV-2 by FDA under an Emergency Use Authorization (EUA). This EUA will remain in effect (meaning this test can be used) for the duration of the COVID-19 declaration under Section 564(b)(1) of the Act, 21 U.S.C. section 360bbb-3(b)(1), unless the authorization is terminated or revoked.     Resp Syncytial Virus by PCR NEGATIVE NEGATIVE Final    Comment: (NOTE) Fact Sheet for  Patients: BloggerCourse.com  Fact Sheet for Healthcare Providers: SeriousBroker.it  This test is not yet approved or cleared by the [place] FDA and has been authorized for detection and/or diagnosis of SARS-CoV-2 by FDA under an Emergency Use Authorization (EUA). This EUA will remain in effect (meaning this test can be used) for the duration of the COVID-19 declaration under Section 564(b)(1) of the Act, 21 U.S.C. section 360bbb-3(b)(1), unless the authorization is terminated or revoked.  Performed at [facility], [address], [place], [place] [address]   Blood Culture (routine x 2)     Status: None (Preliminary result)   Collection Time: 06/20/23  5:56 PM   Specimen: BLOOD RIGHT ARM  Result Value Ref Range Status   Specimen Description BLOOD RIGHT ARM  Final   Special Requests   Final    BOTTLES DRAWN AEROBIC AND ANAEROBIC Blood Culture results may not be optimal due to an inadequate volume of blood received in culture bottles   Culture   Final    NO GROWTH 3 DAYS Performed at [facility], [address] [place], [place] [address]    Report Status PENDING  Incomplete  Blood Culture (routine x 2)     Status: None (Preliminary result)   Collection Time: 06/20/23  6:51 PM   Specimen: BLOOD  Result Value Ref Range Status   Specimen Description BLOOD BLOOD LEFT HAND  Final   Special Requests Blood Culture adequate volume  Final   Culture   Final    NO GROWTH 3 DAYS Performed at [facility], [address] [place], [place] [address]    Report Status PENDING  Incomplete   Time coordinating discharge: 38 mins   SIGNED:  Standley Dakins, MD  [facility] 06/23/2023, 10:40 AM How to contact the [facility] Attending or Consulting provider 7A - 7P or covering provider during after hours 7P -7A, for this patient?  Check the care team in [facility] and look for a) attending/consulting [facility] provider listed and b) the [facility] team  listed Log into [email or web address] and use [facility] [facility]'s universal password to access. If you do not have the password, please contact the hospital operator. Locate the [facility] provider you are looking for under [facility] and page to a number that you can be directly reached. If you still have difficulty reaching the provider, please page the [facility] (Director on Call) for the Hospitalists listed on amion for assistance.

## 2023-06-23 NOTE — ED Provider Notes (Signed)
[place] EMERGENCY DEPARTMENT AT [facility] Provider Note   CSN: [id number] Arrival date & time: 06/23/23  1648     History  Chief Complaint  Patient presents with  . Weakness    Stephen WEIGELT Sr. is a 82 y.o. male.  82 year old male recently diagnosed with a PE on Eliquis and discharged today presents emergency department after difficulty getting in the house.  History obtained per patient and his family.  They report that he was going to get in his house and was having difficulty getting up the first step.  He stepped and then said that he felt like he was not able to keep going.  Was lowered to the ground.  EMS had to be called because family was unable to get him off the ground.  Patient denies any symptoms at this time and says he is feeling well.  Was sitting outside for an hour prior to attempting to go inside.  No head strike.  Denies any symptoms at this time.  Family thinks that he became deconditioned from hospitalization.  Was seen by physical therapy today who recommended home health PT; however, they feel like they are not ready to take care of him at this time.       Home Medications Prior to Admission medications   Medication Sig Start Date End Date Taking? Authorizing Provider  Alcohol Swabs (B-D SINGLE USE SWABS REGULAR) PADS Apply topically. 04/22/23   [provider]  apixaban (ELIQUIS) 5 MG TABS tablet 2 po BID thru 8/6, then 1 po BID 06/23/23   Johnson, Clanford L, MD  atorvastatin (LIPITOR) 40 MG tablet Take 40 mg by mouth daily.    [provider]  hydrALAZINE (APRESOLINE) 100 MG tablet Take 0.5 tablets (50 mg total) by mouth 3 (three) times daily. 06/23/23   Johnson, Clanford L, MD  isosorbide mononitrate (IMDUR) 30 MG 24 hr tablet Take 30 mg by mouth daily. 03/24/23   [provider]  losartan-hydrochlorothiazide (HYZAAR) 100-25 MG tablet Take 1 tablet by mouth daily. 05/27/21   [provider]  metFORMIN (GLUCOPHAGE)  500 MG tablet Take 500 mg by mouth 2 (two) times a day. 02/28/19   [provider]  NON FORMULARY Diet Type:  NAS, Consistent Carbohydrate, bland diet 05/04/19   [provider]  TRADJENTA 5 MG TABS tablet Take 5 mg by mouth daily. 05/27/21   [provider]  TRUE METRIX BLOOD GLUCOSE TEST test strip SMARTSIG:Via Meter 05/12/23   [provider]  verapamil (CALAN-SR) 240 MG CR tablet Take 240 mg by mouth daily. 05/27/21   [provider]      Allergies    Patient has no known allergies.    Review of Systems   Review of Systems  Physical Exam Updated Vital Signs BP (!) 150/80   Pulse 89   Temp 98.2 F (36.8 C) (Oral)   Resp (!) 21   Ht 5\' 6"  (1.676 m)   Wt 92.1 kg   SpO2 91%   BMI 32.77 kg/m  Physical Exam Vitals and nursing note reviewed.  Constitutional:      General: He is not in acute distress.    Appearance: He is well-developed.  HENT:     Head: Normocephalic and atraumatic.     Right Ear: External ear normal.     Left Ear: External ear normal.     Nose: Nose normal.  Eyes:     Extraocular Movements: Extraocular movements intact.  Conjunctiva/sclera: Conjunctivae normal.     Pupils: Pupils are equal, round, and reactive to light.  Cardiovascular:     Rate and Rhythm: Normal rate and regular rhythm.     Heart sounds: Normal heart sounds.  Pulmonary:     Effort: Pulmonary effort is normal. No respiratory distress.  Abdominal:     Palpations: Abdomen is soft.  Musculoskeletal:     Cervical back: Normal range of motion and neck supple.  Skin:    General: Skin is warm and dry.  Neurological:     Mental Status: He is alert and oriented to person, place, and time. Mental status is at baseline.     Cranial Nerves: No cranial nerve deficit.     Sensory: No sensory deficit.     Motor: No weakness.  Psychiatric:        Mood and Affect: Mood normal.        Behavior: Behavior normal.     ED Results / Procedures / Treatments    Labs (all labs ordered are listed, but only abnormal results are displayed) Labs Reviewed  CBC WITH DIFFERENTIAL/PLATELET - Abnormal; Notable for the following components:      Result Value   RBC 3.75 (*)    Hemoglobin 11.4 (*)    HCT 37.2 (*)    All other components within normal limits  COMPREHENSIVE METABOLIC PANEL - Abnormal; Notable for the following components:   Sodium 133 (*)    Glucose, Bld 202 (*)    BUN 28 (*)    Creatinine, Ser 1.51 (*)    Calcium 8.4 (*)    Total Protein 6.3 (*)    Albumin 3.2 (*)    GFR, Estimated 46 (*)    All other components within normal limits  CBG MONITORING, ED - Abnormal; Notable for the following components:   Glucose-Capillary 163 (*)    All other components within normal limits  MAGNESIUM  BASIC METABOLIC PANEL  MAGNESIUM  PHOSPHORUS  TROPONIN I (HIGH SENSITIVITY)  TROPONIN I (HIGH SENSITIVITY)    EKG EKG Interpretation Date/Time:  Thursday June 23 2023 17:01:17 EDT Ventricular Rate:  97 PR Interval:  195 QRS Duration:  94 QT Interval:  367 QTC Calculation: 467 R Axis:   3  Text Interpretation: Sinus rhythm Abnormal R-wave progression, early transition Confirmed by Vonita Moss [phone]) on 06/23/2023 5:02:47 PM  Radiology No results found.  Procedures Procedures    Medications Ordered in ED Medications  apixaban (ELIQUIS) tablet 10 mg (10 mg Oral Given 06/23/23 2222)    Followed by  apixaban (ELIQUIS) tablet 5 mg (has no administration in time range)  insulin aspart (novoLOG) injection 0-15 Units (has no administration in time range)  insulin aspart (novoLOG) injection 0-5 Units ( Subcutaneous Not Given 06/23/23 2213)  hydrALAZINE (APRESOLINE) tablet 25 mg (25 mg Oral Given 06/23/23 2239)  isosorbide mononitrate (IMDUR) 24 hr tablet 30 mg (has no administration in time range)  verapamil (CALAN-SR) CR tablet 120 mg (has no administration in time range)  0.9 %  sodium chloride infusion ( Intravenous New Bag/Given 06/23/23  2221)  lactated ringers bolus 500 mL (0 mLs Intravenous Stopped 06/23/23 2000)    ED Course/ Medical Decision Making/ A&P Clinical Course as of 06/24/23 0127  Thu Jun 23, 2023  1840 Creatinine(!): 1.51 Baseline 1.2 [RP]  1842 Dr. Randol Kern was consulted and has seen the patient [RP]    Clinical Course User Index [RP] Rondel Baton, MD  Medical Decision Making Amount and/or Complexity of Data Reviewed Labs: ordered. Decision-making details documented in ED Course.   Stephen Revering Sr. is a 82 y.o. male with comorbidities that complicate the patient evaluation including PE on Eliquis who was discharged today presents to the emergency department with generalized weakness  Initial Ddx:  Deconditioning, anemia, AKI, worsening PE, MI  MDM/Course:  Patient was discharged today after being hospitalized for pulmonary embolism.  Was having some difficulty getting into the house due to generalized weakness.  Not having any new chest pain or shortness of breath or other symptoms.  Did reportedly have a fall but no head strike and does not have any external signs of trauma on exam.  The patient and the family feel that he is more deconditioned after getting admitted to the hospital and was having some difficulty getting up the stairs of this house because of this.  The patient did see physical therapy earlier today and was able to ambulate with a walker.  He was recommended for home health PT.  Family feels that they need a few more days to try to coordinate help with getting him into the house and care in the house.  Medicine has been consulted.  Social work has been made aware.  Will have PT see the patient again in the emergency department and engaged with family to ensure that he has a safe discharge plan.  Placed in TOC boarder status.  Was given small amount of fluids due to elevated creatinine.  This patient presents to the ED for concern of complaints  listed in HPI, this involves an extensive number of treatment options, and is a complaint that carries with it a high risk of complications and morbidity. Disposition including potential need for admission considered.   Dispo: TOC boarder  Additional history obtained from son Records reviewed Outpatient Clinic Notes and DC Summary The following labs were independently interpreted: Chemistry and show AKI I personally reviewed and interpreted cardiac monitoring: normal sinus rhythm  I personally reviewed and interpreted the pt's EKG: see above for interpretation  I have reviewed the patients home medications and made adjustments as needed Consults: Hospitalist Social Determinants of health:  Elderly, ambulatory dysfunction          Final Clinical Impression(s) / ED Diagnoses Final diagnoses:  Generalized weakness  History of pulmonary embolism    Rx / DC Orders ED Discharge Orders     None         Rondel Baton, MD 06/24/23 0127

## 2023-06-24 DIAGNOSIS — N179 Acute kidney failure, unspecified: Secondary | ICD-10-CM | POA: Diagnosis not present

## 2023-06-24 LAB — CBG MONITORING, ED
Glucose-Capillary: 264 mg/dL — ABNORMAL HIGH (ref 70–99)
Glucose-Capillary: 199 mg/dL — ABNORMAL HIGH (ref 70–99)

## 2023-06-24 NOTE — Progress Notes (Signed)
CONSULTANT PROGRESS NOTE  Stephen SLEDGE Sr. (DOB: 1941-06-07) ZOX:[id number] PCP: [name], MD  Brief Narrative: Stephen Revering Sr. is an 82 y.o. male with a history of T2DM, HTN, HLD, stage III CKD, and recent admission for acute DVT/PE. He was discharged home 8/1 after PT assessment deemed him functionally able to return to that environment with family support. When he got home he felt weak and sat down outside, not getting up to go in the house, so was brought back to the ED 06/23/2023. It appears his family feels he requires rehabilitation placement due to deconditioning from his recent admission. Vital signs were stable, labs unremarkable with exception of mild AKI which has been treated with some IV fluids.   Subjective: This morning, Stephen Rogers is without complaints. He ate all of his breakfast, has worked with OT who walked with him to the hall and back to the chair in the ED. He has been to rehabilitation facilities in the past and does not feel he needs that at this time. Denies focal numbness, weakness, tingling, etc.    Objective: BP (!) 150/80   Pulse 89   Temp 98.2 F (36.8 C) (Oral)   Resp (!) 21   Ht 5\' 6"  (1.676 m)   Wt 92.1 kg   SpO2 91%   BMI 32.77 kg/m   Gen: Elderly male, animated, in no distress Pulm: Clear, nonlabored  CV: Regular borderline tachycardia after exertion with neuro exam. GI: Soft, NT, ND, +BS  Neuro: Alert and oriented. No new focal deficits. Ext: Warm, no deformities. Skin: No acute rashes, lesions or ulcers on visualized skin   Assessment & Plan: Stephen Rogers remains hemodynamically stable with stable work up at this time. His issue is disposition. On my discussion he is against rehabilitation, but admits his family has different opinion. OT has seen. We will await PT evaluation in attempt to inform safe disposition. Still no medical indication to require admission.  Tyrone Nine, MD [facility] [email or web address] 06/24/2023,  9:42 AM

## 2023-06-24 NOTE — Evaluation (Signed)
Occupational Therapy Evaluation Patient Details Name: Stephen DORKO Sr. MRN: [id number] DOB: 08/26/1941 Today's Date: 06/24/2023   History of Present Illness Stephen SALADIN Sr. is a 82 y.o. male.     82 year old male recently diagnosed with a PE on Eliquis and discharged today presents emergency department after difficulty getting in the house.  History obtained per patient and his family.  They report that he was going to get in his house and was having difficulty getting up the first step.  He stepped and then said that he felt like he was not able to keep going.  Was lowered to the ground.  EMS had to be called because family was unable to get him off the ground.  Patient denies any symptoms at this time and says he is feeling well.  Was sitting outside for an hour prior to attempting to go inside.  No head strike.  Denies any symptoms at this time.  Family thinks that he became deconditioned from hospitalization.  Was seen by physical therapy today who recommended home health PT; however, they feel like they are not ready to take care of him at this time. (per MD)   Clinical Impression   Pt agreeable to OT and PT co-evaluation. Pt reported independence with ADL's at baseline. Today pt required mod A for lower body dressing per clinical judgement based on pt's difficulty donning socks. Pt reported he does not wear socks at home. Contact guard assist to min A needed for transfer with RW. Pt left in chair with call bell within reach. Pt will benefit from continued OT in the hospital and recommended venue below to increase strength, balance, and endurance for safe ADL's.        Recommendations for follow up therapy are one component of a multi-disciplinary discharge planning process, led by the attending physician.  Recommendations may be updated based on patient status, additional functional criteria and insurance authorization.   Assistance Recommended at Discharge Intermittent  Supervision/Assistance  Patient can return home with the following A little help with walking and/or transfers;A lot of help with bathing/dressing/bathroom;Assistance with cooking/housework;Assist for transportation;Help with stairs or ramp for entrance    Functional Status Assessment  Patient has had a recent decline in their functional status and demonstrates the ability to make significant improvements in function in a reasonable and predictable amount of time.  Equipment Recommendations  None recommended by OT           Precautions / Restrictions Precautions Precautions: Fall Restrictions Weight Bearing Restrictions: No      Mobility Bed Mobility Overal bed mobility: Modified Independent                  Transfers Overall transfer level: Needs assistance Equipment used: Rolling walker (2 wheels) Transfers: Sit to/from Stand, Bed to chair/wheelchair/BSC Sit to Stand: Min guard, Min assist     Step pivot transfers: Min guard     General transfer comment: Mild labored movement with RW      Balance Overall balance assessment: Needs assistance Sitting-balance support: Feet supported, No upper extremity supported Sitting balance-Leahy Scale: Good Sitting balance - Comments: seated at EOB   Standing balance support: During functional activity, Bilateral upper extremity supported, Reliant on assistive device for balance Standing balance-Leahy Scale: Fair Standing balance comment: fair using RW,                           ADL either performed or  assessed with clinical judgement   ADL Overall ADL's : Needs assistance/impaired     Grooming: Set up;Sitting   Upper Body Bathing: Set up;Sitting   Lower Body Bathing: Moderate assistance;Sitting/lateral leans   Upper Body Dressing : Set up;Sitting   Lower Body Dressing: Moderate assistance;Sitting/lateral leans Lower Body Dressing Details (indicate cue type and reason): Pt required assist to doff L  LE sock. Able to don while seated in recliner. Toilet Transfer: Min guard;Minimal assistance;Rolling walker (2 wheels) Toilet Transfer Details (indicate cue type and reason): Simulated via EOB to chair transfer. Toileting- Clothing Manipulation and Hygiene: Modified independent;Sitting/lateral lean       Functional mobility during ADLs: Min guard;Minimal assistance;Rolling walker (2 wheels) General ADL Comments: Pt ambulated out to hall and back to chair with RW.     Vision Baseline Vision/History: 0 No visual deficits Ability to See in Adequate Light: 0 Adequate Patient Visual Report: No change from baseline Vision Assessment?: No apparent visual deficits     Perception Perception Perception Tested?: No   Praxis Praxis Praxis tested?: Within functional limits    Pertinent Vitals/Pain Pain Assessment Pain Assessment: No/denies pain     Hand Dominance Right   Extremity/Trunk Assessment Upper Extremity Assessment Upper Extremity Assessment: Overall WFL for tasks assessed   Lower Extremity Assessment Lower Extremity Assessment: Defer to PT evaluation   Cervical / Trunk Assessment Cervical / Trunk Assessment: Kyphotic   Communication Communication Communication: No difficulties   Cognition Arousal/Alertness: Awake/alert Behavior During Therapy: WFL for tasks assessed/performed Overall Cognitive Status: Within Functional Limits for tasks assessed                                                        Home Living Family/patient expects to be discharged to:: Private residence Living Arrangements: Children Available Help at Discharge: Family;Available 24 hours/day Type of Home: House Home Access: Level entry     Home Layout: One level     Bathroom Shower/Tub: Chief Strategy Officer: Standard Bathroom Accessibility: Yes   Home Equipment: Agricultural consultant (2 wheels);Cane - single point;BSC/3in1;Shower seat;Shower seat - built in           Prior Functioning/Environment Prior Level of Function : Needs assist;Driving       Physical Assist : ADLs (physical)   ADLs (physical): IADLs Mobility Comments: household and short distanced community ambulator using SPC, occasionally drives ADLs Comments: Pt reports independence with ADL's.        OT Problem List: Decreased strength;Decreased activity tolerance;Impaired balance (sitting and/or standing)      OT Treatment/Interventions: Self-care/ADL training;Therapeutic exercise;Patient/family education;Balance training;DME and/or AE instruction;Therapeutic activities    OT Goals(Current goals can be found in the care plan section) Acute Rehab OT Goals Patient Stated Goal: return home OT Goal Formulation: With patient Time For Goal Achievement: 07/08/23 Potential to Achieve Goals: Good  OT Frequency: Min 1X/week    Co-evaluation PT/OT/SLP Co-Evaluation/Treatment: Yes Reason for Co-Treatment: To address functional/ADL transfers   OT goals addressed during session: ADL's and self-care      AM-PAC OT "6 Clicks" Daily Activity     Outcome Measure Help from another person eating meals?: None Help from another person taking care of personal grooming?: A Little Help from another person toileting, which includes using toliet, bedpan, or urinal?: A Little Help from another person  bathing (including washing, rinsing, drying)?: A Lot Help from another person to put on and taking off regular upper body clothing?: A Little Help from another person to put on and taking off regular lower body clothing?: A Lot 6 Click Score: 17   End of Session Equipment Utilized During Treatment: Rolling walker (2 wheels);Gait belt Nurse Communication: Other (comment) (notified pt was in chair)  Activity Tolerance: Patient tolerated treatment well Patient left: in chair;with call bell/phone within reach  OT Visit Diagnosis: Unsteadiness on feet (R26.81);Other abnormalities of gait and  mobility (R26.89);Muscle weakness (generalized) (M62.81)                Time: 7829-5621 OT Time Calculation (min): 12 min Charges:  OT General Charges $OT Visit: 1 Visit OT Evaluation $OT Eval Low Complexity: 1 Low    OT, MOT  Danie Chandler 06/24/2023, 9:14 AM

## 2023-06-24 NOTE — ED Provider Notes (Signed)
Emergency Medicine Observation Re-evaluation Note  Stephen EMMANUEL Sr. is a 82 y.o. male, seen on rounds today.  Pt initially presented to the ED for complaints of Weakness Currently, the patient is sleeping.  Physical Exam  BP (!) 150/80   Pulse 89   Temp 98.2 F (36.8 C) (Oral)   Resp (!) 21   Ht 5\' 6"  (1.676 m)   Wt 92.1 kg   SpO2 91%   BMI 32.77 kg/m  Physical Exam General: No acute distress Cardiac: Well-perfused Lungs: Nonlabored Psych: Calm  ED Course / MDM  EKG:EKG Interpretation Date/Time:  Thursday June 23 2023 17:01:17 EDT Ventricular Rate:  97 PR Interval:  195 QRS Duration:  94 QT Interval:  367 QTC Calculation: 467 R Axis:   3  Text Interpretation: Sinus rhythm Abnormal R-wave progression, early transition Confirmed by Vonita Moss [phone]) on 06/23/2023 5:02:47 PM  I have reviewed the labs performed to date as well as medications administered while in observation.  Recent changes in the last 24 hours include return to ED after discharge.  Plan  Current plan is for SNF placement.  12:54 PM.  I was informed by social work that his sister is coming to pick him up.   Terrilee Files, MD 06/24/23 1255

## 2023-06-24 NOTE — Evaluation (Signed)
Physical Therapy Evaluation Patient Details Name: Stephen DIVIS Sr. MRN: [id number] DOB: July 02, 1941 Today's Date: 06/24/2023  History of Present Illness  Stephen SANTANA Sr. is a 82 y.o. male.     82 year old male recently diagnosed with a PE on Eliquis and discharged today presents emergency department after difficulty getting in the house.  History obtained per patient and his family.  They report that he was going to get in his house and was having difficulty getting up the first step.  He stepped and then said that he felt like he was not able to keep going.  Was lowered to the ground.  EMS had to be called because family was unable to get him off the ground.  Patient denies any symptoms at this time and says he is feeling well.  Was sitting outside for an hour prior to attempting to go inside.  No head strike.  Denies any symptoms at this time.  Family thinks that he became deconditioned from hospitalization.  Was seen by physical therapy today who recommended home health PT; however, they feel like they are not ready to take care of him at this time. (per MD)   Clinical Impression  Patient demonstrates slightly labored movement for sitting up at bedside, increased time with labored movement during transfer to chair and when ambulating in room/hallway and limited mostly due to c/o fatigue.  Patient tolerated sitting up in chair after therapy - nursing staff notified.  Patient will benefit from continued skilled physical therapy in hospital and recommended venue below to increase strength, balance, endurance for safe ADLs and gait.        If plan is discharge home, recommend the following: A little help with walking and/or transfers;A little help with bathing/dressing/bathroom;Assistance with cooking/housework;Help with stairs or ramp for entrance   Can travel by private vehicle   Yes    Equipment Recommendations None recommended by PT  Recommendations for Other Services        Functional Status Assessment Patient has had a recent decline in their functional status and demonstrates the ability to make significant improvements in function in a reasonable and predictable amount of time.     Precautions / Restrictions Precautions Precautions: Fall Restrictions Weight Bearing Restrictions: No      Mobility  Bed Mobility Overal bed mobility: Modified Independent             General bed mobility comments: slightly labored movement    Transfers Overall transfer level: Needs assistance Equipment used: Rolling walker (2 wheels) Transfers: Sit to/from Stand, Bed to chair/wheelchair/BSC Sit to Stand: Min guard   Step pivot transfers: Min guard       General transfer comment: unsteady labored movement    Ambulation/Gait Ambulation/Gait assistance: Min assist, Min guard Gait Distance (Feet): 40 Feet Assistive device: Rolling walker (2 wheels) Gait Pattern/deviations: Decreased step length - right, Decreased step length - left, Decreased stride length Gait velocity: decreased     General Gait Details: slightly labored movement without loss of balance and limited mostly due to fatigue  Stairs            Wheelchair Mobility     Tilt Bed    Modified Rankin (Stroke Patients Only)       Balance Overall balance assessment: Needs assistance Sitting-balance support: Feet supported, No upper extremity supported Sitting balance-Leahy Scale: Good Sitting balance - Comments: seated at EOB   Standing balance support: During functional activity, Bilateral upper extremity supported, Reliant on assistive  device for balance Standing balance-Leahy Scale: Fair Standing balance comment: fair using RW,                             Pertinent Vitals/Pain Pain Assessment Pain Assessment: No/denies pain    Home Living Family/patient expects to be discharged to:: Private residence Living Arrangements: Children Available Help at  Discharge: Family;Available 24 hours/day Type of Home: House Home Access: Level entry       Home Layout: One level Home Equipment: Agricultural consultant (2 wheels);Cane - single point;BSC/3in1;Shower seat;Shower seat - built in      Prior Function Prior Level of Function : Needs assist;Driving       Physical Assist : ADLs (physical) Mobility (physical): Transfers;Gait;Stairs;Bed mobility ADLs (physical): IADLs Mobility Comments: household and short distanced community ambulator using SPC, occasionally drives ADLs Comments: Pt reports independence with ADL's.     Hand Dominance   Dominant Hand: Right    Extremity/Trunk Assessment   Upper Extremity Assessment Upper Extremity Assessment: Defer to OT evaluation    Lower Extremity Assessment Lower Extremity Assessment: Generalized weakness    Cervical / Trunk Assessment Cervical / Trunk Assessment: Kyphotic  Communication   Communication: No difficulties  Cognition Arousal/Alertness: Awake/alert Behavior During Therapy: WFL for tasks assessed/performed Overall Cognitive Status: Within Functional Limits for tasks assessed                                          General Comments      Exercises     Assessment/Plan    PT Assessment Patient needs continued PT services  PT Problem List Decreased strength;Decreased activity tolerance;Decreased balance;Decreased mobility       PT Treatment Interventions DME instruction;Gait training;Stair training;Functional mobility training;Therapeutic activities;Therapeutic exercise;Balance training    PT Goals (Current goals can be found in the Care Plan section)  Acute Rehab PT Goals Patient Stated Goal: return home with family to assist PT Goal Formulation: With patient Time For Goal Achievement: 07/08/23 Potential to Achieve Goals: Good    Frequency Min 2X/week     Co-evaluation PT/OT/SLP Co-Evaluation/Treatment: Yes Reason for Co-Treatment: To address  functional/ADL transfers PT goals addressed during session: Mobility/safety with mobility;Balance;Proper use of DME OT goals addressed during session: ADL's and self-care       AM-PAC PT "6 Clicks" Mobility  Outcome Measure Help needed turning from your back to your side while in a flat bed without using bedrails?: None Help needed moving from lying on your back to sitting on the side of a flat bed without using bedrails?: None Help needed moving to and from a bed to a chair (including a wheelchair)?: A Little Help needed standing up from a chair using your arms (e.g., wheelchair or bedside chair)?: A Little Help needed to walk in hospital room?: A Little Help needed climbing 3-5 steps with a railing? : A Lot 6 Click Score: 19    End of Session   Activity Tolerance: Patient tolerated treatment well;Patient limited by fatigue Patient left: in chair;with call bell/phone within reach Nurse Communication: Mobility status PT Visit Diagnosis: Unsteadiness on feet (R26.81);Other abnormalities of gait and mobility (R26.89);Muscle weakness (generalized) (M62.81)    Time: 4742-5956 PT Time Calculation (min) (ACUTE ONLY): 20 min   Charges:   PT Evaluation $PT Eval Low Complexity: 1 Low $PT Eval Moderate Complexity: 1 Mod PT Treatments $  Therapeutic Activity: 8-22 mins PT General Charges $$ ACUTE PT VISIT: 1 Visit         11:37 AM, 06/24/23 Ocie Bob, MPT Physical Therapist with [facility] [phone] office [phone] mobile phone

## 2023-06-24 NOTE — Plan of Care (Signed)
  Problem: Acute Rehab OT Goals (only OT should resolve) Goal: Pt. Will Perform Grooming Flowsheets (Taken 06/24/2023 0917) Pt Will Perform Grooming:  with modified independence  standing Goal: Pt. Will Perform Lower Body Bathing Flowsheets (Taken 06/24/2023 0917) Pt Will Perform Lower Body Bathing:  with modified independence  sitting/lateral leans Goal: Pt. Will Perform Lower Body Dressing Flowsheets (Taken 06/24/2023 0917) Pt Will Perform Lower Body Dressing:  with modified independence  sitting/lateral leans Goal: Pt. Will Transfer To Toilet Flowsheets (Taken 06/24/2023 [phone]) Pt Will Transfer to Toilet:  with modified independence  ambulating    OT, MOT

## 2023-06-24 NOTE — Plan of Care (Signed)
  Problem: Acute Rehab PT Goals(only PT should resolve) Goal: Pt Will Go Supine/Side To Sit Outcome: Progressing Flowsheets (Taken 06/24/2023 1139) Pt will go Supine/Side to Sit:  Independently  with modified independence Goal: Patient Will Transfer Sit To/From Stand Outcome: Progressing Flowsheets (Taken 06/24/2023 1139) Patient will transfer sit to/from stand: with supervision Goal: Pt Will Transfer Bed To Chair/Chair To Bed Outcome: Progressing Flowsheets (Taken 06/24/2023 1139) Pt will Transfer Bed to Chair/Chair to Bed: with supervision Goal: Pt Will Ambulate Outcome: Progressing Flowsheets (Taken 06/24/2023 1139) Pt will Ambulate:  75 feet  with min guard assist  with minimal assist  with rolling walker   11:39 AM, 06/24/23 Ocie Bob, MPT Physical Therapist with [facility] [phone] office [phone] mobile phone

## 2023-06-24 NOTE — ED Notes (Signed)
CSW spoke with pt about SNF placement. Pt states that he will return home and will not be placed in a SNF. CSW inquired about if family would think this was a good decision. Pt states that he will be going home and that it is his decision in the end to make. CSW spoke with pts sister to update on this, she is understanding and will arrive shortly to pick pt up. Pt has HH set up with [facility], they will follow. CSW updated MD and RN of plan. TOC signing off.

## 2023-06-26 DIAGNOSIS — E8809 Other disorders of plasma-protein metabolism, not elsewhere classified: Secondary | ICD-10-CM | POA: Diagnosis not present

## 2023-06-26 DIAGNOSIS — E669 Obesity, unspecified: Secondary | ICD-10-CM | POA: Diagnosis not present

## 2023-06-26 DIAGNOSIS — I129 Hypertensive chronic kidney disease with stage 1 through stage 4 chronic kidney disease, or unspecified chronic kidney disease: Secondary | ICD-10-CM | POA: Diagnosis not present

## 2023-06-26 DIAGNOSIS — N1831 Chronic kidney disease, stage 3a: Secondary | ICD-10-CM | POA: Diagnosis not present

## 2023-06-26 DIAGNOSIS — E1165 Type 2 diabetes mellitus with hyperglycemia: Secondary | ICD-10-CM | POA: Diagnosis not present

## 2023-06-26 DIAGNOSIS — I2699 Other pulmonary embolism without acute cor pulmonale: Secondary | ICD-10-CM | POA: Diagnosis not present

## 2023-06-26 DIAGNOSIS — G9341 Metabolic encephalopathy: Secondary | ICD-10-CM | POA: Diagnosis not present

## 2023-06-26 DIAGNOSIS — E782 Mixed hyperlipidemia: Secondary | ICD-10-CM | POA: Diagnosis not present

## 2023-06-26 DIAGNOSIS — E441 Mild protein-calorie malnutrition: Secondary | ICD-10-CM | POA: Diagnosis not present

## 2023-06-30 DIAGNOSIS — I129 Hypertensive chronic kidney disease with stage 1 through stage 4 chronic kidney disease, or unspecified chronic kidney disease: Secondary | ICD-10-CM | POA: Diagnosis not present

## 2023-06-30 DIAGNOSIS — E782 Mixed hyperlipidemia: Secondary | ICD-10-CM | POA: Diagnosis not present

## 2023-06-30 DIAGNOSIS — G9341 Metabolic encephalopathy: Secondary | ICD-10-CM | POA: Diagnosis not present

## 2023-06-30 DIAGNOSIS — N1831 Chronic kidney disease, stage 3a: Secondary | ICD-10-CM | POA: Diagnosis not present

## 2023-06-30 DIAGNOSIS — E8809 Other disorders of plasma-protein metabolism, not elsewhere classified: Secondary | ICD-10-CM | POA: Diagnosis not present

## 2023-06-30 DIAGNOSIS — E669 Obesity, unspecified: Secondary | ICD-10-CM | POA: Diagnosis not present

## 2023-06-30 DIAGNOSIS — E1165 Type 2 diabetes mellitus with hyperglycemia: Secondary | ICD-10-CM | POA: Diagnosis not present

## 2023-06-30 DIAGNOSIS — I2699 Other pulmonary embolism without acute cor pulmonale: Secondary | ICD-10-CM | POA: Diagnosis not present

## 2023-06-30 DIAGNOSIS — E441 Mild protein-calorie malnutrition: Secondary | ICD-10-CM | POA: Diagnosis not present

## 2023-07-04 DIAGNOSIS — I10 Essential (primary) hypertension: Secondary | ICD-10-CM | POA: Diagnosis not present

## 2023-07-04 DIAGNOSIS — N1831 Chronic kidney disease, stage 3a: Secondary | ICD-10-CM | POA: Diagnosis not present

## 2023-07-04 DIAGNOSIS — I2699 Other pulmonary embolism without acute cor pulmonale: Secondary | ICD-10-CM | POA: Diagnosis not present

## 2023-07-04 DIAGNOSIS — E1165 Type 2 diabetes mellitus with hyperglycemia: Secondary | ICD-10-CM | POA: Diagnosis not present

## 2023-07-04 DIAGNOSIS — R000 Tachycardia, unspecified: Secondary | ICD-10-CM | POA: Diagnosis not present

## 2023-07-08 DIAGNOSIS — E441 Mild protein-calorie malnutrition: Secondary | ICD-10-CM | POA: Diagnosis not present

## 2023-07-08 DIAGNOSIS — E1165 Type 2 diabetes mellitus with hyperglycemia: Secondary | ICD-10-CM | POA: Diagnosis not present

## 2023-07-08 DIAGNOSIS — G9341 Metabolic encephalopathy: Secondary | ICD-10-CM | POA: Diagnosis not present

## 2023-07-08 DIAGNOSIS — I129 Hypertensive chronic kidney disease with stage 1 through stage 4 chronic kidney disease, or unspecified chronic kidney disease: Secondary | ICD-10-CM | POA: Diagnosis not present

## 2023-07-08 DIAGNOSIS — E669 Obesity, unspecified: Secondary | ICD-10-CM | POA: Diagnosis not present

## 2023-07-08 DIAGNOSIS — I2699 Other pulmonary embolism without acute cor pulmonale: Secondary | ICD-10-CM | POA: Diagnosis not present

## 2023-07-08 DIAGNOSIS — N1831 Chronic kidney disease, stage 3a: Secondary | ICD-10-CM | POA: Diagnosis not present

## 2023-07-08 DIAGNOSIS — E782 Mixed hyperlipidemia: Secondary | ICD-10-CM | POA: Diagnosis not present

## 2023-07-08 DIAGNOSIS — E8809 Other disorders of plasma-protein metabolism, not elsewhere classified: Secondary | ICD-10-CM | POA: Diagnosis not present

## 2023-07-12 DIAGNOSIS — I129 Hypertensive chronic kidney disease with stage 1 through stage 4 chronic kidney disease, or unspecified chronic kidney disease: Secondary | ICD-10-CM | POA: Diagnosis not present

## 2023-07-12 DIAGNOSIS — E441 Mild protein-calorie malnutrition: Secondary | ICD-10-CM | POA: Diagnosis not present

## 2023-07-12 DIAGNOSIS — E669 Obesity, unspecified: Secondary | ICD-10-CM | POA: Diagnosis not present

## 2023-07-12 DIAGNOSIS — N1831 Chronic kidney disease, stage 3a: Secondary | ICD-10-CM | POA: Diagnosis not present

## 2023-07-12 DIAGNOSIS — E8809 Other disorders of plasma-protein metabolism, not elsewhere classified: Secondary | ICD-10-CM | POA: Diagnosis not present

## 2023-07-12 DIAGNOSIS — I2699 Other pulmonary embolism without acute cor pulmonale: Secondary | ICD-10-CM | POA: Diagnosis not present

## 2023-07-12 DIAGNOSIS — E782 Mixed hyperlipidemia: Secondary | ICD-10-CM | POA: Diagnosis not present

## 2023-07-12 DIAGNOSIS — E1165 Type 2 diabetes mellitus with hyperglycemia: Secondary | ICD-10-CM | POA: Diagnosis not present

## 2023-07-12 DIAGNOSIS — G9341 Metabolic encephalopathy: Secondary | ICD-10-CM | POA: Diagnosis not present

## 2023-07-19 DIAGNOSIS — I129 Hypertensive chronic kidney disease with stage 1 through stage 4 chronic kidney disease, or unspecified chronic kidney disease: Secondary | ICD-10-CM | POA: Diagnosis not present

## 2023-07-19 DIAGNOSIS — E782 Mixed hyperlipidemia: Secondary | ICD-10-CM | POA: Diagnosis not present

## 2023-07-19 DIAGNOSIS — I2699 Other pulmonary embolism without acute cor pulmonale: Secondary | ICD-10-CM | POA: Diagnosis not present

## 2023-07-19 DIAGNOSIS — G9341 Metabolic encephalopathy: Secondary | ICD-10-CM | POA: Diagnosis not present

## 2023-07-19 DIAGNOSIS — E1165 Type 2 diabetes mellitus with hyperglycemia: Secondary | ICD-10-CM | POA: Diagnosis not present

## 2023-07-19 DIAGNOSIS — E669 Obesity, unspecified: Secondary | ICD-10-CM | POA: Diagnosis not present

## 2023-07-19 DIAGNOSIS — N1831 Chronic kidney disease, stage 3a: Secondary | ICD-10-CM | POA: Diagnosis not present

## 2023-07-19 DIAGNOSIS — E441 Mild protein-calorie malnutrition: Secondary | ICD-10-CM | POA: Diagnosis not present

## 2023-07-19 DIAGNOSIS — E8809 Other disorders of plasma-protein metabolism, not elsewhere classified: Secondary | ICD-10-CM | POA: Diagnosis not present

## 2023-07-26 DIAGNOSIS — E8809 Other disorders of plasma-protein metabolism, not elsewhere classified: Secondary | ICD-10-CM | POA: Diagnosis not present

## 2023-07-26 DIAGNOSIS — G9341 Metabolic encephalopathy: Secondary | ICD-10-CM | POA: Diagnosis not present

## 2023-07-26 DIAGNOSIS — E669 Obesity, unspecified: Secondary | ICD-10-CM | POA: Diagnosis not present

## 2023-07-26 DIAGNOSIS — I129 Hypertensive chronic kidney disease with stage 1 through stage 4 chronic kidney disease, or unspecified chronic kidney disease: Secondary | ICD-10-CM | POA: Diagnosis not present

## 2023-07-26 DIAGNOSIS — N1831 Chronic kidney disease, stage 3a: Secondary | ICD-10-CM | POA: Diagnosis not present

## 2023-07-26 DIAGNOSIS — I2699 Other pulmonary embolism without acute cor pulmonale: Secondary | ICD-10-CM | POA: Diagnosis not present

## 2023-07-26 DIAGNOSIS — E1165 Type 2 diabetes mellitus with hyperglycemia: Secondary | ICD-10-CM | POA: Diagnosis not present

## 2023-07-26 DIAGNOSIS — E782 Mixed hyperlipidemia: Secondary | ICD-10-CM | POA: Diagnosis not present

## 2023-07-26 DIAGNOSIS — E441 Mild protein-calorie malnutrition: Secondary | ICD-10-CM | POA: Diagnosis not present

## 2023-07-28 DIAGNOSIS — N1831 Chronic kidney disease, stage 3a: Secondary | ICD-10-CM | POA: Diagnosis not present

## 2023-07-28 DIAGNOSIS — E669 Obesity, unspecified: Secondary | ICD-10-CM | POA: Diagnosis not present

## 2023-07-28 DIAGNOSIS — E782 Mixed hyperlipidemia: Secondary | ICD-10-CM | POA: Diagnosis not present

## 2023-07-28 DIAGNOSIS — E8809 Other disorders of plasma-protein metabolism, not elsewhere classified: Secondary | ICD-10-CM | POA: Diagnosis not present

## 2023-07-28 DIAGNOSIS — E1165 Type 2 diabetes mellitus with hyperglycemia: Secondary | ICD-10-CM | POA: Diagnosis not present

## 2023-07-28 DIAGNOSIS — G9341 Metabolic encephalopathy: Secondary | ICD-10-CM | POA: Diagnosis not present

## 2023-07-28 DIAGNOSIS — I129 Hypertensive chronic kidney disease with stage 1 through stage 4 chronic kidney disease, or unspecified chronic kidney disease: Secondary | ICD-10-CM | POA: Diagnosis not present

## 2023-07-28 DIAGNOSIS — E441 Mild protein-calorie malnutrition: Secondary | ICD-10-CM | POA: Diagnosis not present

## 2023-07-28 DIAGNOSIS — I2699 Other pulmonary embolism without acute cor pulmonale: Secondary | ICD-10-CM | POA: Diagnosis not present

## 2023-08-04 DIAGNOSIS — E1165 Type 2 diabetes mellitus with hyperglycemia: Secondary | ICD-10-CM | POA: Diagnosis not present

## 2023-08-04 DIAGNOSIS — I10 Essential (primary) hypertension: Secondary | ICD-10-CM | POA: Diagnosis not present

## 2023-08-05 DIAGNOSIS — N1831 Chronic kidney disease, stage 3a: Secondary | ICD-10-CM | POA: Diagnosis not present

## 2023-08-05 DIAGNOSIS — E669 Obesity, unspecified: Secondary | ICD-10-CM | POA: Diagnosis not present

## 2023-08-05 DIAGNOSIS — E782 Mixed hyperlipidemia: Secondary | ICD-10-CM | POA: Diagnosis not present

## 2023-08-05 DIAGNOSIS — I129 Hypertensive chronic kidney disease with stage 1 through stage 4 chronic kidney disease, or unspecified chronic kidney disease: Secondary | ICD-10-CM | POA: Diagnosis not present

## 2023-08-05 DIAGNOSIS — E441 Mild protein-calorie malnutrition: Secondary | ICD-10-CM | POA: Diagnosis not present

## 2023-08-05 DIAGNOSIS — I2699 Other pulmonary embolism without acute cor pulmonale: Secondary | ICD-10-CM | POA: Diagnosis not present

## 2023-08-05 DIAGNOSIS — E1165 Type 2 diabetes mellitus with hyperglycemia: Secondary | ICD-10-CM | POA: Diagnosis not present

## 2023-08-05 DIAGNOSIS — G9341 Metabolic encephalopathy: Secondary | ICD-10-CM | POA: Diagnosis not present

## 2023-08-05 DIAGNOSIS — E8809 Other disorders of plasma-protein metabolism, not elsewhere classified: Secondary | ICD-10-CM | POA: Diagnosis not present

## 2023-08-16 ENCOUNTER — Ambulatory Visit: Payer: Medicare HMO | Attending: Medical | Admitting: Medical

## 2023-08-16 ENCOUNTER — Encounter: Payer: Self-pay | Admitting: Medical

## 2023-08-16 VITALS — BP 148/78 | HR 116 | Ht 66.0 in | Wt 192.4 lb

## 2023-08-16 DIAGNOSIS — I10 Essential (primary) hypertension: Secondary | ICD-10-CM

## 2023-08-16 DIAGNOSIS — E782 Mixed hyperlipidemia: Secondary | ICD-10-CM

## 2023-08-16 DIAGNOSIS — I2699 Other pulmonary embolism without acute cor pulmonale: Secondary | ICD-10-CM | POA: Diagnosis not present

## 2023-08-16 DIAGNOSIS — R011 Cardiac murmur, unspecified: Secondary | ICD-10-CM | POA: Diagnosis not present

## 2023-08-16 DIAGNOSIS — R000 Tachycardia, unspecified: Secondary | ICD-10-CM

## 2023-08-16 MED ORDER — METOPROLOL SUCCINATE ER 25 MG PO TB24: 25.0000 mg | ORAL_TABLET | Freq: Every day | ORAL | 3 refills | Status: DC

## 2023-08-16 NOTE — Progress Notes (Signed)
Cardiology Office Note:    Date:  08/16/2023   ID:  Stephen Revering Sr., DOB 11-Dec-1940, MRN [id number]  PCP:  [name], MD  [facility] Cardiologist:  Dina Rich, MD  [facility] Electrophysiologist:  None   Referring MD: [name]*   Chief Complaint: Hospital follow-up  History of Present Illness:    Stephen BENALLY Sr. is a 82 y.o. male with a hx of tachycardia, hypertension, heart murmur, diabetes type 2, CKD stage III, hyperlipidemia, PE in 05/2022 who presents for follow-up.  Patient has a history of mild sinus tachycardia since 2013.  Patient has a history of high blood pressure on Imdur.  Echo in December 2023 showed no significant valvular abnormality, did have some basal septal hypertrophy with flow acceleration of the LVOT.  Patient was admitted in July 2024 for acute PE started on Eliquis.  Lactic acidosis, hypoalbuminemia, CKD stage III with a baseline of 1.6.  Echo showed EF greater than 75%, no wall motion abnormalities, grade 1 diastolic dysfunction, normal RV SF.  Today, the patient reports he is overall doing well. He is taking Eliquis daily. He denies bleeding issues. Breathing is back to normal. He denies chest pain. He has dependent lower leg edema. Hris 116bpm.   Past Medical History:  Diagnosis Date   CKD (chronic kidney disease), stage III (HCC)    Diabetes mellitus    DVT (deep venous thrombosis) (HCC) 10/14/2016   Extremity edema 09/2016   Hyperlipemia    Hypertension     Past Surgical History:  Procedure Laterality Date   CHOLECYSTECTOMY N/A 04/23/2019   Procedure: CHOLECYSTECTOMY;  Surgeon: [name], MD;  Location: [facility];  Service: General;  Laterality: N/A;   COLONOSCOPY  02/2010   Dr. [name]: diverticuolosis, multiple polyps removed, cecal adenoma.    COLONOSCOPY WITH PROPOFOL N/A 04/19/2019   Procedure: COLONOSCOPY WITH PROPOFOL;  Surgeon: [name], MD;  Location: [facility];  Service:  Endoscopy;  Laterality: N/A;   IR GENERIC HISTORICAL  10/19/2016   IR PERC CHOLECYSTOSTOMY 10/19/2016 [name], MD [facility]   IR GENERIC HISTORICAL  10/25/2016   IR CATHETER TUBE CHANGE 10/25/2016 [name], MD [facility]   IR GENERIC HISTORICAL  11/30/2016   IR RADIOLOGIST EVAL & MGMT 11/30/2016 [facility]   PARTIAL COLECTOMY N/A 04/23/2019   Procedure: PARTIAL COLECTOMY;  Surgeon: [name], MD;  Location: [facility];  Service: General;  Laterality: N/A;   POLYPECTOMY  04/19/2019   Procedure: POLYPECTOMY;  Surgeon: [name], MD;  Location: [facility];  Service: Endoscopy;;  polyp at recto sigmoid    Current Medications: Current Meds  Medication Sig   Alcohol Swabs (B-D SINGLE USE SWABS REGULAR) PADS Apply topically.   apixaban (ELIQUIS) 5 MG TABS tablet 2 po BID thru 8/6, then 1 po BID   atorvastatin (LIPITOR) 40 MG tablet Take 40 mg by mouth daily.   hydrALAZINE (APRESOLINE) 100 MG tablet Take 0.5 tablets (50 mg total) by mouth 3 (three) times daily.   isosorbide mononitrate (IMDUR) 30 MG 24 hr tablet Take 30 mg by mouth daily.   losartan-hydrochlorothiazide (HYZAAR) 100-25 MG tablet Take 1 tablet by mouth daily.   metFORMIN (GLUCOPHAGE) 500 MG tablet Take 500 mg by mouth 2 (two) times a day.   metoprolol succinate (TOPROL XL) 25 MG 24 hr tablet Take 1 tablet (25 mg total) by mouth daily.   NON FORMULARY Diet Type:  NAS, Consistent Carbohydrate, bland diet  TRADJENTA 5 MG TABS tablet Take 5 mg by mouth daily.   TRUE METRIX BLOOD GLUCOSE TEST test strip SMARTSIG:Via Meter   verapamil (CALAN-SR) 240 MG CR tablet Take 240 mg by mouth daily.     Allergies:   Patient has no known allergies.   Social History   Socioeconomic History   Marital status: Widowed    Spouse name: Not on file   Number of children: Not on file   Years of education: Not on file   Highest education level: Not on file  Occupational History   Not on file  Tobacco Use   Smoking  status: Never   Smokeless tobacco: Never  Vaping Use   Vaping status: Never Used  Substance and Sexual Activity   Alcohol use: No   Drug use: No   Sexual activity: Not on file  Other Topics Concern   Not on file  Social History Narrative   Not on file   Social Determinants of Health   Financial Resource Strain: Not on file  Food Insecurity: No Food Insecurity (06/21/2023)   Hunger Vital Sign    Worried About Running Out of Food in the Last Year: Never true    Ran Out of Food in the Last Year: Never true  Transportation Needs: No Transportation Needs (06/21/2023)   PRAPARE - Administrator, Civil Service (Medical): No    Lack of Transportation (Non-Medical): No  Physical Activity: Not on file  Stress: Not on file  Social Connections: Not on file     Family History: The patient's family history includes Diabetes type II in his father; Heart attack in his maternal aunt and another family member; Hypertension in his father; Tuberculosis in some other family members.  ROS:   Please see the history of present illness.     All other systems reviewed and are negative.  EKGs/Labs/Other Studies Reviewed:    The following studies were reviewed today:  Echo 05/2023  1. Left ventricular ejection fraction, by estimation, is >75%. The left  ventricle has hyperdynamic function. The left ventricle has no regional  wall motion abnormalities. Left ventricular diastolic parameters are  consistent with Grade I diastolic  dysfunction (impaired relaxation).   2. Right ventricular systolic function is normal. The right ventricular  size is normal. There is mildly elevated pulmonary artery systolic  pressure.   3. The mitral valve is normal in structure. No evidence of mitral valve  regurgitation. No evidence of mitral stenosis.   4. The tricuspid valve is abnormal.   5. The aortic valve is tricuspid. Aortic valve regurgitation is not  visualized. No aortic stenosis is present.    6. The inferior vena cava is normal in size with greater than 50%  respiratory variability, suggesting right atrial pressure of 3 mmHg.   Echo 10/2022  1. Left ventricular ejection fraction, by estimation, is 60 to 65%. The  left ventricle has normal function. Left ventricular endocardial border  not optimally defined to evaluate regional wall motion. There is moderate  asymmetric left ventricular  hypertrophy of the basal segment. Left ventricular diastolic parameters  are consistent with Grade I diastolic dysfunction (impaired relaxation).  There is flow accerelation in the left ventricular outflow tract likely  secondary to basal septal  hypertrophy.   2. Right ventricular systolic function is normal. The right ventricular  size is normal. There is normal pulmonary artery systolic pressure.   3. Left atrial size was mildly dilated.   4. The mitral  valve is grossly normal. No evidence of mitral valve  regurgitation. No evidence of mitral stenosis.   5. The aortic valve is tricuspid. Aortic valve regurgitation not  assessed. No aortic stenosis is present.   Comparison(s): No prior Echocardiogram.   EKG:  EKG is not ordered today.    Recent Labs: 06/23/2023: ALT 33; Hemoglobin 11.4; Platelets 254 06/24/2023: BUN 22; Creatinine, Ser 1.23; Magnesium 2.1; Potassium 4.1; Sodium 136  Recent Lipid Panel No results found for: "CHOL", "TRIG", "HDL", "CHOLHDL", "VLDL", "LDLCALC", "LDLDIRECT"  Physical Exam:    VS:  BP (!) 148/78   Pulse (!) 116   Ht 5\' 6"  (1.676 m)   Wt 192 lb 6.4 oz (87.3 kg)   SpO2 95%   BMI 31.05 kg/m     Wt Readings from Last 3 Encounters:  08/16/23 192 lb 6.4 oz (87.3 kg)  06/23/23 203 lb (92.1 kg)  06/20/23 202 lb 9.6 oz (91.9 kg)     GEN:  Well nourished, well developed in no acute distress HEENT: Normal NECK: No JVD; No carotid bruits LYMPHATICS: No lymphadenopathy CARDIAC: tachycardia, RR, no murmurs, rubs, gallops RESPIRATORY:  Clear to auscultation  without rales, wheezing or rhonchi  ABDOMEN: Soft, non-tender, non-distended MUSCULOSKELETAL:  mild peda; edema; No deformity  SKIN: Warm and dry NEUROLOGIC:  Alert and oriented x 3 PSYCHIATRIC:  Normal affect   ASSESSMENT:    1. Other acute pulmonary embolism, unspecified whether acute cor pulmonale present (HCC)   2. Sinus tachycardia   3. Essential hypertension   4. Heart murmur   5. Hyperlipidemia, mixed    PLAN:    In order of problems listed above:  Acute PE Patient was diagnosed with acute PE in July 2024 in the right upper and lower lobes, started on Eliquis 5 mg twice daily.  Patient follows up with PCP in December.  He reports breathing is back to normal.  Tachycardia Patient has a long history of tachycardia.  Echo during admission showed hyperdynamic function greater than 75%.  Heart rate today is 116 bpm.  He is on verapamil to 40 mg daily.  I will start Toprol 25 mg daily, can likely increase at subsequent visits.  HTN Blood pressure is mildly elevated today.  He is taking hydralazine 50 mg 3 times daily, Imdur 30 mg daily, Hyzaar 100-25 mg daily, and verapamil to 40 mg daily.  I will add on Toprol as above.  Heart murmur Heart murmur on exam.  Recent echo showed no significant valvular disease.  HLD Patient needs updated lipid panel.  Continue Lipitor 40 mg daily.  Disposition: Follow up in 3 month(s) with MD/APP    Signed, Pahoua Schreiner David Stall, PA-C  08/16/2023 1:25 PM    [facility]

## 2023-08-16 NOTE — Patient Instructions (Signed)
Medication Instructions:  Your physician has recommended you make the following change in your medication:   Start Toprol XL 25 mg Daily   *If you need a refill on your cardiac medications before your next appointment, please call your pharmacy*   Lab Work: NONE   If you have labs (blood work) drawn today and your tests are completely normal, you will receive your results only by: MyChart Message (if you have MyChart) OR A paper copy in the mail If you have any lab test that is abnormal or we need to change your treatment, we will call you to review the results.   Testing/Procedures: NONE    Follow-Up: At [facility], you and your health needs are our priority.  As part of our continuing mission to provide you with exceptional heart care, we have created designated Provider Care Teams.  These Care Teams include your primary Cardiologist (physician) and Advanced Practice Providers (APPs -  Physician Assistants and Nurse Practitioners) who all work together to provide you with the care you need, when you need it.  We recommend signing up for the patient portal called "MyChart".  Sign up information is provided on this After Visit Summary.  MyChart is used to connect with patients for Virtual Visits (Telemedicine).  Patients are able to view lab/test results, encounter notes, upcoming appointments, etc.  Non-urgent messages can be sent to your provider as well.   To learn more about what you can do with MyChart, go to [email or web address].    Your next appointment:   3 month(s)  Provider:   You may see Dina Rich, MD or one of the following Advanced Practice Providers on your designated Care Team:   Randall An, PA-C  Jacolyn Reedy, [place]     Other Instructions Thank you for choosing [facility]!

## 2023-09-03 DIAGNOSIS — E1165 Type 2 diabetes mellitus with hyperglycemia: Secondary | ICD-10-CM | POA: Diagnosis not present

## 2023-09-03 DIAGNOSIS — I10 Essential (primary) hypertension: Secondary | ICD-10-CM | POA: Diagnosis not present

## 2023-10-04 DIAGNOSIS — E1165 Type 2 diabetes mellitus with hyperglycemia: Secondary | ICD-10-CM | POA: Diagnosis not present

## 2023-10-04 DIAGNOSIS — I10 Essential (primary) hypertension: Secondary | ICD-10-CM | POA: Diagnosis not present

## 2023-10-24 DIAGNOSIS — Z0001 Encounter for general adult medical examination with abnormal findings: Secondary | ICD-10-CM | POA: Diagnosis not present

## 2023-10-24 DIAGNOSIS — I10 Essential (primary) hypertension: Secondary | ICD-10-CM | POA: Diagnosis not present

## 2023-10-27 DIAGNOSIS — E1165 Type 2 diabetes mellitus with hyperglycemia: Secondary | ICD-10-CM | POA: Diagnosis not present

## 2023-10-27 DIAGNOSIS — I10 Essential (primary) hypertension: Secondary | ICD-10-CM | POA: Diagnosis not present

## 2023-10-27 DIAGNOSIS — Z23 Encounter for immunization: Secondary | ICD-10-CM | POA: Diagnosis not present

## 2023-10-27 DIAGNOSIS — N1831 Chronic kidney disease, stage 3a: Secondary | ICD-10-CM | POA: Diagnosis not present

## 2023-10-27 DIAGNOSIS — E782 Mixed hyperlipidemia: Secondary | ICD-10-CM | POA: Diagnosis not present

## 2023-11-24 ENCOUNTER — Ambulatory Visit: Payer: Medicare HMO | Admitting: Cardiology

## 2023-11-24 ENCOUNTER — Encounter: Payer: Self-pay | Admitting: Student

## 2023-11-24 ENCOUNTER — Ambulatory Visit: Payer: Medicare HMO | Attending: Student | Admitting: Student

## 2023-11-24 VITALS — BP 158/78 | HR 110 | Ht 66.0 in | Wt 204.4 lb

## 2023-11-24 DIAGNOSIS — I10 Essential (primary) hypertension: Secondary | ICD-10-CM | POA: Diagnosis not present

## 2023-11-24 DIAGNOSIS — E782 Mixed hyperlipidemia: Secondary | ICD-10-CM

## 2023-11-24 DIAGNOSIS — R000 Tachycardia, unspecified: Secondary | ICD-10-CM

## 2023-11-24 DIAGNOSIS — Z86711 Personal history of pulmonary embolism: Secondary | ICD-10-CM | POA: Diagnosis not present

## 2023-11-24 MED ORDER — METOPROLOL SUCCINATE ER 50 MG PO TB24: 50.0000 mg | ORAL_TABLET | Freq: Every day | ORAL | 3 refills | Status: AC

## 2023-11-24 NOTE — Patient Instructions (Signed)
 Medication Instructions:  Your physician has recommended you make the following change in your medication:   - Increase Toprol  XL to 50 mg once daily.   *If you need a refill on your cardiac medications before your next appointment, please call your pharmacy*   Lab Work: None If you have labs (blood work) drawn today and your tests are completely normal, you will receive your results only by: MyChart Message (if you have MyChart) OR A paper copy in the mail If you have any lab test that is abnormal or we need to change your treatment, we will call you to review the results.   Testing/Procedures: None   Follow-Up: At [facility], you and your health needs are our priority.  As part of our continuing mission to provide you with exceptional heart care, we have created designated Provider Care Teams.  These Care Teams include your primary Cardiologist (physician) and Advanced Practice Providers (APPs -  Physician Assistants and Nurse Practitioners) who all work together to provide you with the care you need, when you need it.  We recommend signing up for the patient portal called MyChart.  Sign up information is provided on this After Visit Summary.  MyChart is used to connect with patients for Virtual Visits (Telemedicine).  Patients are able to view lab/test results, encounter notes, upcoming appointments, etc.  Non-urgent messages can be sent to your provider as well.   To learn more about what you can do with MyChart, go to [email or web address].    Your next appointment:   6 month(s)  Provider:   You may see Stephen Carrier, MD or one of the following Advanced Practice Providers on your designated Care Team:   Stephen Qua, PA-C  Stephen Rogers, [place]     Other Instructions Please keep a blood pressure log and return it to our office in 1 month.

## 2023-11-24 NOTE — Progress Notes (Signed)
 Cardiology Office Note    Date:  11/24/2023  ID:  Stephen FORBES Ochs Sr., DOB March 03, 1941, MRN [id number] Cardiologist: Alvan Carrier, MD    History of Present Illness:    Stephen SUMMER Sr. is a 83 y.o. male with past medical history of history of PE (diagnosed in 05/2023), sinus tachycardia, HTN, HLD, Type II DM and Stage III CKD who presents to the office today for 66-month follow-up  He was examined by Mikey Fishman, PA in 07/2023 and had recently been hospitalized for a PE. He reported overall doing well at the time of his office visit and denied any chest pain or shortness of breath. He was tachycardic with heart rate at 116 bpm and was on Verapamil  240 mg daily and it was recommended to start Toprol -XL 25 mg daily.  In talking with the patient and his sister today, she reports that his activity is overall limited as he is lazy as he sits and watches television or plays on his computer throughout the day. He denies any specific dyspnea on exertion but again is not overly active at baseline. No recent chest pain or palpitations. No orthopnea or PND. He does experience intermittent lower extremity edema if he does not elevate his legs routinely. They do try to limit his sodium intake.  Studies Reviewed:   EKG: EKG is not ordered today.   Echocardiogram: 05/2023 IMPRESSIONS     1. Left ventricular ejection fraction, by estimation, is >75%. The left  ventricle has hyperdynamic function. The left ventricle has no regional  wall motion abnormalities. Left ventricular diastolic parameters are  consistent with Grade I diastolic  dysfunction (impaired relaxation).   2. Right ventricular systolic function is normal. The right ventricular  size is normal. There is mildly elevated pulmonary artery systolic  pressure.   3. The mitral valve is normal in structure. No evidence of mitral valve  regurgitation. No evidence of mitral stenosis.   4. The tricuspid valve is abnormal.   5. The  aortic valve is tricuspid. Aortic valve regurgitation is not  visualized. No aortic stenosis is present.   6. The inferior vena cava is normal in size with greater than 50%  respiratory variability, suggesting right atrial pressure of 3 mmHg.    Risk Assessment/Calculations:     HYPERTENSION CONTROL Vitals:   11/24/23 1447 11/24/23 1451 11/24/23 1510  BP: (!) 174/102 (!) 174/100 (!) 158/78    The patient's blood pressure is elevated above target today.  In order to address the patient's elevated BP: A current anti-hypertensive medication was adjusted today.    Physical Exam:   VS:  BP (!) 158/78   Pulse (!) 110   Ht 5' 6 (1.676 m)   Wt 204 lb 6.4 oz (92.7 kg)   SpO2 98%   BMI 32.99 kg/m    Wt Readings from Last 3 Encounters:  11/24/23 204 lb 6.4 oz (92.7 kg)  08/16/23 192 lb 6.4 oz (87.3 kg)  06/23/23 203 lb (92.1 kg)     GEN: Pleasant, elderly male appearing in no acute distress NECK: No JVD; No carotid bruits CARDIAC: RRR, no murmurs, rubs, gallops RESPIRATORY:  Clear to auscultation without rales, wheezing or rhonchi  ABDOMEN: Appears non-distended. No obvious abdominal masses. EXTREMITIES: No clubbing or cyanosis. Trace lower extremity edema.  Distal pedal pulses are 2+ bilaterally.   Assessment and Plan:   1. Sinus Tachycardia - This has been a longstanding issue for the patient which he reports dates back to  when he was a teenager. Recent echocardiogram in 05/2023 showed a hyperdynamic LVEF greater than 75% and normal RV function. He has been on Verapamil  SR 240 mg daily and will titrate Toprol -XL from 25 mg daily to 50 mg daily to help with his tachycardia and also elevated BP.  2. HTN - BP was initially recorded at 174/100, rechecked and at 158/78. They have not been checking this routinely at home but report it was previously elevated. He is currently taking Hydralazine  50 mg 3 times daily, Imdur  30 mg daily, Losartan -HCTZ 100-25 mg daily, Toprol -XL 25 mg  daily and Verapamil  SR 240 mg daily. Given his elevated BP and also tachycardia, will titrate Toprol -XL to 50 mg daily. He was provided with a BP log.  Pending his trend, may need to further titrate Hydralazine .  3. HLD - Followed by his PCP and we will request a copy of most recent labs. He has been continued on Atorvastatin  40 mg daily.  4. History of PE - He had a history of a DVT in the past and recently diagnosed with a PE in 05/2023. At this point, anticipate he would remain on lifelong anticoagulation given his recurrent events. No reports of active bleeding. Continue Eliquis  5 mg twice daily for anticoagulation.  Signed, Laymon CHRISTELLA Qua, PA-C

## 2023-11-27 DIAGNOSIS — E1165 Type 2 diabetes mellitus with hyperglycemia: Secondary | ICD-10-CM | POA: Diagnosis not present

## 2023-11-27 DIAGNOSIS — I10 Essential (primary) hypertension: Secondary | ICD-10-CM | POA: Diagnosis not present

## 2023-12-28 DIAGNOSIS — I10 Essential (primary) hypertension: Secondary | ICD-10-CM | POA: Diagnosis not present

## 2023-12-28 DIAGNOSIS — E1165 Type 2 diabetes mellitus with hyperglycemia: Secondary | ICD-10-CM | POA: Diagnosis not present

## 2024-01-25 DIAGNOSIS — I10 Essential (primary) hypertension: Secondary | ICD-10-CM | POA: Diagnosis not present

## 2024-01-25 DIAGNOSIS — E1165 Type 2 diabetes mellitus with hyperglycemia: Secondary | ICD-10-CM | POA: Diagnosis not present

## 2024-02-25 DIAGNOSIS — I10 Essential (primary) hypertension: Secondary | ICD-10-CM | POA: Diagnosis not present

## 2024-02-25 DIAGNOSIS — E1165 Type 2 diabetes mellitus with hyperglycemia: Secondary | ICD-10-CM | POA: Diagnosis not present

## 2024-03-26 DIAGNOSIS — I10 Essential (primary) hypertension: Secondary | ICD-10-CM | POA: Diagnosis not present

## 2024-03-26 DIAGNOSIS — E1165 Type 2 diabetes mellitus with hyperglycemia: Secondary | ICD-10-CM | POA: Diagnosis not present

## 2024-04-19 DIAGNOSIS — I2699 Other pulmonary embolism without acute cor pulmonale: Secondary | ICD-10-CM | POA: Diagnosis not present

## 2024-04-19 DIAGNOSIS — E785 Hyperlipidemia, unspecified: Secondary | ICD-10-CM | POA: Diagnosis not present

## 2024-04-19 DIAGNOSIS — I10 Essential (primary) hypertension: Secondary | ICD-10-CM | POA: Diagnosis not present

## 2024-04-26 DIAGNOSIS — Z1389 Encounter for screening for other disorder: Secondary | ICD-10-CM | POA: Diagnosis not present

## 2024-04-26 DIAGNOSIS — E7849 Other hyperlipidemia: Secondary | ICD-10-CM | POA: Diagnosis not present

## 2024-04-26 DIAGNOSIS — R262 Difficulty in walking, not elsewhere classified: Secondary | ICD-10-CM | POA: Diagnosis not present

## 2024-04-26 DIAGNOSIS — E1165 Type 2 diabetes mellitus with hyperglycemia: Secondary | ICD-10-CM | POA: Diagnosis not present

## 2024-04-26 DIAGNOSIS — Z0001 Encounter for general adult medical examination with abnormal findings: Secondary | ICD-10-CM | POA: Diagnosis not present

## 2024-04-26 DIAGNOSIS — Z7901 Long term (current) use of anticoagulants: Secondary | ICD-10-CM | POA: Diagnosis not present

## 2024-04-26 DIAGNOSIS — Z1331 Encounter for screening for depression: Secondary | ICD-10-CM | POA: Diagnosis not present

## 2024-04-26 DIAGNOSIS — Z86711 Personal history of pulmonary embolism: Secondary | ICD-10-CM | POA: Diagnosis not present

## 2024-04-26 DIAGNOSIS — I10 Essential (primary) hypertension: Secondary | ICD-10-CM | POA: Diagnosis not present

## 2024-05-02 ENCOUNTER — Ambulatory Visit: Payer: Medicare HMO | Attending: Cardiology | Admitting: Cardiology

## 2024-05-02 ENCOUNTER — Encounter: Payer: Self-pay | Admitting: Cardiology

## 2024-05-02 VITALS — BP 140/90 | HR 102 | Ht 66.0 in | Wt 207.4 lb

## 2024-05-02 DIAGNOSIS — E782 Mixed hyperlipidemia: Secondary | ICD-10-CM | POA: Diagnosis not present

## 2024-05-02 DIAGNOSIS — R600 Localized edema: Secondary | ICD-10-CM

## 2024-05-02 DIAGNOSIS — I10 Essential (primary) hypertension: Secondary | ICD-10-CM | POA: Diagnosis not present

## 2024-05-02 DIAGNOSIS — R000 Tachycardia, unspecified: Secondary | ICD-10-CM

## 2024-05-02 NOTE — Progress Notes (Signed)
 Clinical Summary Mr. Calandra is a 83 y.o.male seen today for follow up of the following medical problems.      1. Tachycardia - he reports history of elevated heart rates since he was a teenager.  - EKG review shows chronic mild sinus tach to 109 at least since 2013   - EKG today shows SR 106, PACs - denies any palpitations.      2. HTN - home bp's 130s/80s - compliant with meds   3. Heart murmur - 10/2022 echo no significant valve abnormality, did have some basal septal hypertrophy with flow acceleration of the LVOT  05/2023 echo: >75%, grade I DD, normal RV,    4. DM2 - followed by pcp   5. Hyperliipdemia - recent labs with pcp  6. History of PE - He had a history of a DVT in the past and recently diagnosed with a PE in 05/2023  - on long term anticoag  - no bleeding on eliquis .   Past Medical History:  Diagnosis Date   CKD (chronic kidney disease), stage III (HCC)    Diabetes mellitus    DVT (deep venous thrombosis) (HCC) 10/14/2016   Extremity edema 09/2016   Hyperlipemia    Hypertension      No Known Allergies   Current Outpatient Medications  Medication Sig Dispense Refill   Alcohol  Swabs (B-D SINGLE USE SWABS REGULAR) PADS Apply topically.     apixaban  (ELIQUIS ) 5 MG TABS tablet Take 5 mg by mouth 2 (two) times daily.     atorvastatin  (LIPITOR) 40 MG tablet Take 40 mg by mouth daily.     furosemide  (LASIX ) 20 MG tablet Take 20 mg by mouth daily as needed for edema.     hydrALAZINE  (APRESOLINE ) 100 MG tablet Take 100 mg by mouth 2 (two) times daily.     isosorbide  mononitrate (IMDUR ) 30 MG 24 hr tablet Take 30 mg by mouth daily.     losartan -hydrochlorothiazide (HYZAAR) 100-25 MG tablet Take 1 tablet by mouth daily.     metFORMIN  (GLUCOPHAGE ) 500 MG tablet Take 500 mg by mouth 2 (two) times a day.     metoprolol  succinate (TOPROL -XL) 50 MG 24 hr tablet Take 1 tablet (50 mg total) by mouth daily. Take with or immediately following a meal. 90  tablet 3   NON FORMULARY Diet Type:  NAS, Consistent Carbohydrate, bland diet     TRADJENTA  5 MG TABS tablet Take 5 mg by mouth daily.     TRUE METRIX BLOOD GLUCOSE TEST test strip SMARTSIG:Via Meter     verapamil  (CALAN -SR) 240 MG CR tablet Take 240 mg by mouth daily.     No current facility-administered medications for this visit.     Past Surgical History:  Procedure Laterality Date   CHOLECYSTECTOMY N/A 04/23/2019   Procedure: CHOLECYSTECTOMY;  Surgeon: Alanda Allegra, MD;  Location: [facility];  Service: General;  Laterality: N/A;   COLONOSCOPY  02/2010   Dr. Riley Cheadle: diverticuolosis, multiple polyps removed, cecal adenoma.    COLONOSCOPY WITH PROPOFOL  N/A 04/19/2019   Procedure: COLONOSCOPY WITH PROPOFOL ;  Surgeon: Suzette Espy, MD;  Location: [facility];  Service: Endoscopy;  Laterality: N/A;   IR GENERIC HISTORICAL  10/19/2016   IR PERC CHOLECYSTOSTOMY 10/19/2016 Erica Hau, MD [facility]   IR GENERIC HISTORICAL  10/25/2016   IR CATHETER TUBE CHANGE 10/25/2016 Marland Silvas, MD [facility]   IR GENERIC HISTORICAL  11/30/2016   IR RADIOLOGIST EVAL & MGMT 11/30/2016  [facility]   PARTIAL COLECTOMY N/A 04/23/2019   Procedure: PARTIAL COLECTOMY;  Surgeon: Alanda Allegra, MD;  Location: [facility];  Service: General;  Laterality: N/A;   POLYPECTOMY  04/19/2019   Procedure: POLYPECTOMY;  Surgeon: Suzette Espy, MD;  Location: [facility];  Service: Endoscopy;;  polyp at recto sigmoid     No Known Allergies    Family History  Problem Relation Age of Onset   Heart attack Other    Tuberculosis Other    Tuberculosis Other    Diabetes type II Father    Hypertension Father    Heart attack Maternal Aunt      Social History Mr. Dain reports that he has never smoked. He has never used smokeless tobacco. Mr. Shiflet reports no history of alcohol  use.    Physical Examination Today's Vitals   05/02/24 1314 05/02/24 1346  BP: (!) 156/80 (!) 140/90  Pulse: (!) 102    SpO2: 94%   Weight: 207 lb 6.4 oz (94.1 kg)   Height: 5' 6 (1.676 m)    Body mass index is 33.48 kg/m.  Gen: resting comfortably, no acute distress HEENT: no scleral icterus, pupils equal round and reactive, no palptable cervical adenopathy,  CV: RRR, no m/rg, no jvd Resp: Clear to auscultation bilaterally GI: abdomen is soft, non-tender, non-distended, normal bowel sounds, no hepatosplenomegaly MSK: extremities are warm, 2+ bilateral edema Skin: warm, no rash Neuro:  no focal deficits Psych: appropriate affect   Diagnostic Studies  09/2016 echo Study Conclusions   - Left ventricle: The cavity size was normal. Wall thickness was   increased in a pattern of mild LVH. Systolic function was   vigorous. The estimated ejection fraction was in the range of 65%   to 70%. Wall motion was normal; there were no regional wall   motion abnormalities. Doppler parameters are consistent with   abnormal left ventricular relaxation (grade 1 diastolic   dysfunction). - Aortic valve: Mildly calcified annulus. Trileaflet. - Mitral valve: There was trivial regurgitation. - Left atrium: The atrium was mildly dilated. - Right atrium: Central venous pressure (est): 3 mm Hg. - Atrial septum: No defect or patent foramen ovale was identified. - Tricuspid valve: There was mild regurgitation. - Pulmonary arteries: PA peak pressure: 35 mm Hg (S). - Pericardium, extracardiac: There was no pericardial effusion.   Impressions:   - Mild LVH with sigmoid-shaped basal septum and LVEF 65-70%. Grade   1 diastolic dysfunction. Mild left atrial enlargement. Trivial   mitral regurgitation. Mild calcified aortic annulus. Mild   tricuspid regurgitation with PASP 35 mmHg.     10/2022 echo IMPRESSIONS     1. Left ventricular ejection fraction, by estimation, is 60 to 65%. The  left ventricle has normal function. Left ventricular endocardial border  not optimally defined to evaluate regional wall motion.  There is moderate  asymmetric left ventricular  hypertrophy of the basal segment. Left ventricular diastolic parameters  are consistent with Grade I diastolic dysfunction (impaired relaxation).  There is flow accerelation in the left ventricular outflow tract likely  secondary to basal septal  hypertrophy.   2. Right ventricular systolic function is normal. The right ventricular  size is normal. There is normal pulmonary artery systolic pressure.   3. Left atrial size was mildly dilated.   4. The mitral valve is grossly normal. No evidence of mitral valve  regurgitation. No evidence of mitral stenosis.   5. The aortic valve is tricuspid. Aortic valve regurgitation not  assessed. No aortic stenosis is present.    Assessment and Plan   1. Sinus tachycardia - chronic stable sinus tachycardia well over 10 years, patient himself reports a history of elevated heart rates since he was a teen.  - EKG today shows mild sinus tach, PACs - no symptoms - continue to monitor   2. HTN -elevated here but home numbers at goal, also some signs of fluid overload, bp may improve with more regular lasix  use that we discussed.      3.Hyperlipidemia - we will request labs from pcp    4.LE edema - recent echo hyperdynamic LV function, just grade I dd.  - continue prn lasix , discussed taking more regularly.    Laurann Pollock, M.D.

## 2024-05-02 NOTE — Patient Instructions (Signed)
 Medication Instructions:  Your physician recommends that you continue on your current medications as directed. Please refer to the Current Medication list given to you today.   Labwork: None today  Testing/Procedures: None today  Follow-Up: 6 months  Any Other Special Instructions Will Be Listed Below (If Applicable).  If you need a refill on your cardiac medications before your next appointment, please call your pharmacy.

## 2024-05-15 DIAGNOSIS — E1122 Type 2 diabetes mellitus with diabetic chronic kidney disease: Secondary | ICD-10-CM | POA: Diagnosis not present

## 2024-05-15 DIAGNOSIS — N1831 Chronic kidney disease, stage 3a: Secondary | ICD-10-CM | POA: Diagnosis not present

## 2024-05-15 DIAGNOSIS — I10 Essential (primary) hypertension: Secondary | ICD-10-CM | POA: Diagnosis not present

## 2024-06-14 DIAGNOSIS — E1165 Type 2 diabetes mellitus with hyperglycemia: Secondary | ICD-10-CM | POA: Diagnosis not present

## 2024-06-14 DIAGNOSIS — I10 Essential (primary) hypertension: Secondary | ICD-10-CM | POA: Diagnosis not present

## 2024-07-15 DIAGNOSIS — E1165 Type 2 diabetes mellitus with hyperglycemia: Secondary | ICD-10-CM | POA: Diagnosis not present

## 2024-07-15 DIAGNOSIS — I10 Essential (primary) hypertension: Secondary | ICD-10-CM | POA: Diagnosis not present

## 2024-08-15 DIAGNOSIS — E1165 Type 2 diabetes mellitus with hyperglycemia: Secondary | ICD-10-CM | POA: Diagnosis not present

## 2024-08-15 DIAGNOSIS — I10 Essential (primary) hypertension: Secondary | ICD-10-CM | POA: Diagnosis not present

## 2024-09-14 DIAGNOSIS — E1165 Type 2 diabetes mellitus with hyperglycemia: Secondary | ICD-10-CM | POA: Diagnosis not present

## 2024-09-14 DIAGNOSIS — I10 Essential (primary) hypertension: Secondary | ICD-10-CM | POA: Diagnosis not present

## 2024-09-26 ENCOUNTER — Encounter (HOSPITAL_COMMUNITY): Payer: Self-pay

## 2024-09-26 ENCOUNTER — Emergency Department (HOSPITAL_COMMUNITY)

## 2024-09-26 ENCOUNTER — Inpatient Hospital Stay (HOSPITAL_COMMUNITY)
Admission: EM | Admit: 2024-09-26 | Discharge: 2024-10-02 | DRG: 722 | Disposition: A | Discharge status: Hospice | Attending: Family Medicine | Admitting: Family Medicine

## 2024-09-26 ENCOUNTER — Other Ambulatory Visit: Payer: Self-pay

## 2024-09-26 DIAGNOSIS — E7849 Other hyperlipidemia: Secondary | ICD-10-CM | POA: Diagnosis present

## 2024-09-26 DIAGNOSIS — C7982 Secondary malignant neoplasm of genital organs: Secondary | ICD-10-CM | POA: Diagnosis present

## 2024-09-26 DIAGNOSIS — C7951 Secondary malignant neoplasm of bone: Secondary | ICD-10-CM | POA: Diagnosis not present

## 2024-09-26 DIAGNOSIS — K869 Disease of pancreas, unspecified: Secondary | ICD-10-CM | POA: Diagnosis present

## 2024-09-26 DIAGNOSIS — I129 Hypertensive chronic kidney disease with stage 1 through stage 4 chronic kidney disease, or unspecified chronic kidney disease: Secondary | ICD-10-CM | POA: Diagnosis present

## 2024-09-26 DIAGNOSIS — N400 Enlarged prostate without lower urinary tract symptoms: Secondary | ICD-10-CM | POA: Diagnosis not present

## 2024-09-26 DIAGNOSIS — Z860101 Personal history of adenomatous and serrated colon polyps: Secondary | ICD-10-CM

## 2024-09-26 DIAGNOSIS — J90 Pleural effusion, not elsewhere classified: Secondary | ICD-10-CM | POA: Diagnosis present

## 2024-09-26 DIAGNOSIS — N179 Acute kidney failure, unspecified: Principal | ICD-10-CM | POA: Diagnosis present

## 2024-09-26 DIAGNOSIS — E1129 Type 2 diabetes mellitus with other diabetic kidney complication: Secondary | ICD-10-CM | POA: Diagnosis present

## 2024-09-26 DIAGNOSIS — R040 Epistaxis: Secondary | ICD-10-CM | POA: Diagnosis present

## 2024-09-26 DIAGNOSIS — Z79899 Other long term (current) drug therapy: Secondary | ICD-10-CM

## 2024-09-26 DIAGNOSIS — Z86718 Personal history of other venous thrombosis and embolism: Secondary | ICD-10-CM

## 2024-09-26 DIAGNOSIS — I82402 Acute embolism and thrombosis of unspecified deep veins of left lower extremity: Secondary | ICD-10-CM | POA: Diagnosis present

## 2024-09-26 DIAGNOSIS — E669 Obesity, unspecified: Secondary | ICD-10-CM | POA: Diagnosis present

## 2024-09-26 DIAGNOSIS — R338 Other retention of urine: Secondary | ICD-10-CM | POA: Diagnosis present

## 2024-09-26 DIAGNOSIS — Z6831 Body mass index (BMI) 31.0-31.9, adult: Secondary | ICD-10-CM

## 2024-09-26 DIAGNOSIS — J189 Pneumonia, unspecified organism: Secondary | ICD-10-CM | POA: Diagnosis present

## 2024-09-26 DIAGNOSIS — N289 Disorder of kidney and ureter, unspecified: Secondary | ICD-10-CM | POA: Diagnosis not present

## 2024-09-26 DIAGNOSIS — E1122 Type 2 diabetes mellitus with diabetic chronic kidney disease: Secondary | ICD-10-CM | POA: Diagnosis present

## 2024-09-26 DIAGNOSIS — J811 Chronic pulmonary edema: Secondary | ICD-10-CM | POA: Diagnosis not present

## 2024-09-26 DIAGNOSIS — E782 Mixed hyperlipidemia: Secondary | ICD-10-CM | POA: Diagnosis present

## 2024-09-26 DIAGNOSIS — C61 Malignant neoplasm of prostate: Secondary | ICD-10-CM | POA: Diagnosis not present

## 2024-09-26 DIAGNOSIS — I10 Essential (primary) hypertension: Secondary | ICD-10-CM | POA: Diagnosis present

## 2024-09-26 DIAGNOSIS — C799 Secondary malignant neoplasm of unspecified site: Secondary | ICD-10-CM

## 2024-09-26 DIAGNOSIS — C7800 Secondary malignant neoplasm of unspecified lung: Secondary | ICD-10-CM | POA: Diagnosis not present

## 2024-09-26 DIAGNOSIS — R627 Adult failure to thrive: Secondary | ICD-10-CM | POA: Diagnosis present

## 2024-09-26 DIAGNOSIS — R5381 Other malaise: Secondary | ICD-10-CM | POA: Diagnosis present

## 2024-09-26 DIAGNOSIS — Z833 Family history of diabetes mellitus: Secondary | ICD-10-CM | POA: Diagnosis not present

## 2024-09-26 DIAGNOSIS — N1832 Chronic kidney disease, stage 3b: Secondary | ICD-10-CM | POA: Diagnosis present

## 2024-09-26 DIAGNOSIS — Z6830 Body mass index (BMI) 30.0-30.9, adult: Secondary | ICD-10-CM

## 2024-09-26 DIAGNOSIS — R339 Retention of urine, unspecified: Secondary | ICD-10-CM | POA: Diagnosis present

## 2024-09-26 DIAGNOSIS — E1169 Type 2 diabetes mellitus with other specified complication: Secondary | ICD-10-CM | POA: Diagnosis present

## 2024-09-26 DIAGNOSIS — M6281 Muscle weakness (generalized): Secondary | ICD-10-CM | POA: Diagnosis not present

## 2024-09-26 DIAGNOSIS — D630 Anemia in neoplastic disease: Secondary | ICD-10-CM | POA: Diagnosis present

## 2024-09-26 DIAGNOSIS — Z8249 Family history of ischemic heart disease and other diseases of the circulatory system: Secondary | ICD-10-CM

## 2024-09-26 DIAGNOSIS — Z66 Do not resuscitate: Secondary | ICD-10-CM | POA: Diagnosis not present

## 2024-09-26 DIAGNOSIS — Z7901 Long term (current) use of anticoagulants: Secondary | ICD-10-CM | POA: Diagnosis not present

## 2024-09-26 DIAGNOSIS — E1165 Type 2 diabetes mellitus with hyperglycemia: Secondary | ICD-10-CM | POA: Diagnosis present

## 2024-09-26 DIAGNOSIS — Z7984 Long term (current) use of oral hypoglycemic drugs: Secondary | ICD-10-CM

## 2024-09-26 DIAGNOSIS — R0902 Hypoxemia: Secondary | ICD-10-CM | POA: Diagnosis present

## 2024-09-26 DIAGNOSIS — R000 Tachycardia, unspecified: Secondary | ICD-10-CM | POA: Diagnosis present

## 2024-09-26 DIAGNOSIS — R918 Other nonspecific abnormal finding of lung field: Secondary | ICD-10-CM | POA: Diagnosis not present

## 2024-09-26 DIAGNOSIS — D649 Anemia, unspecified: Secondary | ICD-10-CM | POA: Diagnosis not present

## 2024-09-26 DIAGNOSIS — Z1152 Encounter for screening for COVID-19: Secondary | ICD-10-CM | POA: Diagnosis not present

## 2024-09-26 DIAGNOSIS — D696 Thrombocytopenia, unspecified: Secondary | ICD-10-CM | POA: Diagnosis not present

## 2024-09-26 DIAGNOSIS — R1900 Intra-abdominal and pelvic swelling, mass and lump, unspecified site: Secondary | ICD-10-CM | POA: Diagnosis not present

## 2024-09-26 DIAGNOSIS — R531 Weakness: Secondary | ICD-10-CM | POA: Diagnosis not present

## 2024-09-26 DIAGNOSIS — R319 Hematuria, unspecified: Secondary | ICD-10-CM | POA: Diagnosis present

## 2024-09-26 DIAGNOSIS — R32 Unspecified urinary incontinence: Secondary | ICD-10-CM | POA: Diagnosis present

## 2024-09-26 DIAGNOSIS — Z86711 Personal history of pulmonary embolism: Secondary | ICD-10-CM

## 2024-09-26 DIAGNOSIS — Z515 Encounter for palliative care: Secondary | ICD-10-CM | POA: Diagnosis not present

## 2024-09-26 DIAGNOSIS — R630 Anorexia: Secondary | ICD-10-CM | POA: Diagnosis present

## 2024-09-26 DIAGNOSIS — R591 Generalized enlarged lymph nodes: Secondary | ICD-10-CM | POA: Diagnosis present

## 2024-09-26 DIAGNOSIS — K8689 Other specified diseases of pancreas: Secondary | ICD-10-CM | POA: Diagnosis not present

## 2024-09-26 DIAGNOSIS — I2699 Other pulmonary embolism without acute cor pulmonale: Secondary | ICD-10-CM | POA: Diagnosis present

## 2024-09-26 DIAGNOSIS — R590 Localized enlarged lymph nodes: Secondary | ICD-10-CM | POA: Diagnosis not present

## 2024-09-26 DIAGNOSIS — D62 Acute posthemorrhagic anemia: Secondary | ICD-10-CM | POA: Diagnosis not present

## 2024-09-26 LAB — RESP PANEL BY RT-PCR (RSV, FLU A&B, COVID)  RVPGX2
Influenza A by PCR: NEGATIVE
Influenza B by PCR: NEGATIVE
Resp Syncytial Virus by PCR: NEGATIVE
SARS Coronavirus 2 by RT PCR: NEGATIVE

## 2024-09-26 LAB — URINALYSIS, ROUTINE W REFLEX MICROSCOPIC
Bilirubin Urine: NEGATIVE
Glucose, UA: NEGATIVE mg/dL
Hgb urine dipstick: NEGATIVE
Ketones, ur: NEGATIVE mg/dL
Leukocytes,Ua: NEGATIVE
Nitrite: NEGATIVE
Protein, ur: 30 mg/dL — AB
Specific Gravity, Urine: 1.015 (ref 1.005–1.030)
pH: 5 (ref 5.0–8.0)

## 2024-09-26 LAB — COMPREHENSIVE METABOLIC PANEL WITH GFR
ALT: 39 U/L (ref 0–44)
AST: 74 U/L — ABNORMAL HIGH (ref 15–41)
Albumin: 4.1 g/dL (ref 3.5–5.0)
Alkaline Phosphatase: 318 U/L — ABNORMAL HIGH (ref 38–126)
Anion gap: 12 (ref 5–15)
BUN: 49 mg/dL — ABNORMAL HIGH (ref 8–23)
CO2: 26 mmol/L (ref 22–32)
Calcium: 9 mg/dL (ref 8.9–10.3)
Chloride: 101 mmol/L (ref 98–111)
Creatinine, Ser: 2.13 mg/dL — ABNORMAL HIGH (ref 0.61–1.24)
GFR, Estimated: 30 mL/min — ABNORMAL LOW (ref >60)
Glucose, Bld: 224 mg/dL — ABNORMAL HIGH (ref 70–99)
Potassium: 4.9 mmol/L (ref 3.5–5.1)
Sodium: 140 mmol/L (ref 135–145)
Total Bilirubin: 0.7 mg/dL (ref 0.0–1.2)
Total Protein: 6.2 g/dL — ABNORMAL LOW (ref 6.5–8.1)

## 2024-09-26 LAB — CBC WITH DIFFERENTIAL/PLATELET
Band Neutrophils: 1 %
Basophils Absolute: 0.1 K/uL (ref 0.0–0.1)
Basophils Relative: 1 %
Eosinophils Absolute: 0 K/uL (ref 0.0–0.5)
Eosinophils Relative: 0 %
HCT: 23.1 % — ABNORMAL LOW (ref 39.0–52.0)
Hemoglobin: 7.2 g/dL — ABNORMAL LOW (ref 13.0–17.0)
Lymphocytes Relative: 43 %
Lymphs Abs: 2.4 K/uL (ref 0.7–4.0)
MCH: 30.8 pg (ref 26.0–34.0)
MCHC: 31.2 g/dL (ref 30.0–36.0)
MCV: 98.7 fL (ref 80.0–100.0)
Monocytes Absolute: 0.2 K/uL (ref 0.1–1.0)
Monocytes Relative: 4 %
Neutro Abs: 2.9 K/uL (ref 1.7–7.7)
Neutrophils Relative %: 51 %
Platelets: DECREASED K/uL (ref 150–400)
RBC: 2.34 MIL/uL — ABNORMAL LOW (ref 4.22–5.81)
RDW: 15.7 % — ABNORMAL HIGH (ref 11.5–15.5)
WBC: 5.5 K/uL (ref 4.0–10.5)
nRBC: 10.4 % — ABNORMAL HIGH (ref 0.0–0.2)

## 2024-09-26 LAB — TRANSFERRIN: Transferrin: 231 mg/dL (ref 180–329)

## 2024-09-26 LAB — IRON AND TIBC
Iron: 107 ug/dL (ref 45–182)
Saturation Ratios: 33 % (ref 17.9–39.5)
TIBC: 323 ug/dL (ref 250–450)
UIBC: 216 ug/dL

## 2024-09-26 LAB — POC OCCULT BLOOD, ED: Fecal Occult Bld: NEGATIVE

## 2024-09-26 LAB — PRO BRAIN NATRIURETIC PEPTIDE: Pro Brain Natriuretic Peptide: 1008 pg/mL — ABNORMAL HIGH (ref <300.0)

## 2024-09-26 LAB — FERRITIN: Ferritin: 657 ng/mL — ABNORMAL HIGH (ref 24–336)

## 2024-09-26 MED ORDER — SODIUM CHLORIDE 0.9 % IV SOLN: INTRAVENOUS | Status: DC

## 2024-09-26 MED ORDER — SODIUM CHLORIDE 0.9 % IV SOLN
2.0000 g | Freq: Once | INTRAVENOUS | Status: AC
  Administered 2024-09-26: 2 g via INTRAVENOUS
  Filled 2024-09-26: qty 20

## 2024-09-26 MED ORDER — SODIUM CHLORIDE 0.9 % IV SOLN
500.0000 mg | INTRAVENOUS | Status: DC
  Administered 2024-09-26 – 2024-09-30 (×4): 500 mg via INTRAVENOUS
  Filled 2024-09-26 (×4): qty 5

## 2024-09-26 MED ORDER — HYDROMORPHONE HCL 1 MG/ML IJ SOLN
0.5000 mg | INTRAMUSCULAR | Status: DC | PRN
  Administered 2024-10-02: 0.5 mg via INTRAVENOUS
  Filled 2024-09-26: qty 0.5

## 2024-09-26 MED ORDER — METOPROLOL SUCCINATE ER 50 MG PO TB24
50.0000 mg | ORAL_TABLET | Freq: Every day | ORAL | Status: DC
  Administered 2024-09-26 – 2024-09-30 (×5): 50 mg via ORAL
  Filled 2024-09-26 (×6): qty 1

## 2024-09-26 MED ORDER — SODIUM CHLORIDE 0.9 % IV BOLUS
500.0000 mL | Freq: Once | INTRAVENOUS | Status: AC
  Administered 2024-09-26: 500 mL via INTRAVENOUS

## 2024-09-26 MED ORDER — ACETAMINOPHEN 325 MG PO TABS: 650.0000 mg | ORAL_TABLET | Freq: Four times a day (QID) | ORAL | Status: DC | PRN

## 2024-09-26 MED ORDER — ACETAMINOPHEN 650 MG RE SUPP: 650.0000 mg | Freq: Four times a day (QID) | RECTAL | Status: DC | PRN

## 2024-09-26 MED ORDER — APIXABAN 5 MG PO TABS
5.0000 mg | ORAL_TABLET | Freq: Two times a day (BID) | ORAL | Status: DC
  Administered 2024-09-26 – 2024-09-27 (×3): 5 mg via ORAL
  Filled 2024-09-26: qty 2
  Filled 2024-09-26 (×3): qty 1

## 2024-09-26 MED ORDER — SODIUM CHLORIDE 0.9 % IV BOLUS
1000.0000 mL | Freq: Once | INTRAVENOUS | Status: AC
  Administered 2024-09-26: 1000 mL via INTRAVENOUS

## 2024-09-26 MED ORDER — OXYCODONE HCL 5 MG PO TABS: 5.0000 mg | ORAL_TABLET | ORAL | Status: DC | PRN

## 2024-09-26 MED ORDER — ATORVASTATIN CALCIUM 40 MG PO TABS
40.0000 mg | ORAL_TABLET | Freq: Every day | ORAL | Status: DC
  Administered 2024-09-26 – 2024-09-30 (×5): 40 mg via ORAL
  Filled 2024-09-26 (×6): qty 1

## 2024-09-26 MED ORDER — SODIUM CHLORIDE 0.9 % IV SOLN
2.0000 g | INTRAVENOUS | Status: DC
  Administered 2024-09-27 – 2024-09-29 (×3): 2 g via INTRAVENOUS
  Filled 2024-09-26 (×3): qty 20

## 2024-09-26 NOTE — ED Provider Notes (Signed)
 [place] EMERGENCY DEPARTMENT AT [facility] Provider Note   CSN: [id number] Arrival date & time: 09/26/24  1106     Patient presents with: Weakness   Stephen FORBES Ochs Sr. is a 83 y.o. male.  {Add pertinent medical, surgical, social history, OB history to HPI:32947} HPI Patient presents from home via EMS with concern for fatigue, anorexia. History is obtained by the individual and EMS staff. I received report at bedside on arrival. Patient denies focal pain, acknowledges generalized weakness, onset possibly about 2 weeks ago, without clear precipitant.  Since that time he has noticed substantially decreased capacity for ADL, has been eating and drinking less. EMS reports patient was awake, alert, hemodynamically stable.  However, the patient was found to have new hypoxia, 87% on room air requiring 2 L via nasal cannula for stabilization.     Prior to Admission medications   Medication Sig Start Date End Date Taking? Authorizing Provider  Alcohol  Swabs (B-D SINGLE USE SWABS REGULAR) PADS Apply topically. 04/22/23   [provider]  apixaban  (ELIQUIS ) 5 MG TABS tablet Take 5 mg by mouth 2 (two) times daily.    [provider]  atorvastatin  (LIPITOR) 40 MG tablet Take 40 mg by mouth daily.    [provider]  furosemide  (LASIX ) 20 MG tablet Take 20 mg by mouth daily as needed for edema.    [provider]  hydrALAZINE  (APRESOLINE ) 100 MG tablet Take 100 mg by mouth 2 (two) times daily.    [provider]  isosorbide  mononitrate (IMDUR ) 30 MG 24 hr tablet Take 30 mg by mouth daily. 03/24/23   [provider]  losartan -hydrochlorothiazide (HYZAAR) 100-25 MG tablet Take 1 tablet by mouth daily. 05/27/21   [provider]  metFORMIN  (GLUCOPHAGE ) 500 MG tablet Take 500 mg by mouth 2 (two) times a day. 02/28/19   [provider]  metoprolol  succinate (TOPROL -XL) 50 MG 24 hr tablet Take 1 tablet (50 mg total) by mouth  daily. Take with or immediately following a meal. 11/24/23 05/02/97  [name], Laymon HERO, PA-C  NON FORMULARY Diet Type:  NAS, Consistent Carbohydrate, bland diet 05/04/19   [provider]  TRADJENTA  5 MG TABS tablet Take 5 mg by mouth daily. 05/27/21   [provider]  TRUE METRIX BLOOD GLUCOSE TEST test strip SMARTSIG:Via Meter 05/12/23   [provider]  verapamil  (CALAN -SR) 240 MG CR tablet Take 240 mg by mouth daily. 05/27/21   [provider]    Allergies: Patient has no known allergies.    Review of Systems  Updated Vital Signs BP (!) 129/59   Pulse 85   Temp 98.1 F (36.7 C) (Oral)   Resp 17   SpO2 94%   Physical Exam Vitals and nursing note reviewed.  Constitutional:      General: He is not in acute distress.    Appearance: He is well-developed.  HENT:     Head: Normocephalic and atraumatic.  Eyes:     Conjunctiva/sclera: Conjunctivae normal.  Cardiovascular:     Rate and Rhythm: Normal rate and regular rhythm.  Pulmonary:     Effort: Pulmonary effort is normal. No respiratory distress.     Breath sounds: No stridor.  Abdominal:     General: There is no distension.  Musculoskeletal:     Right lower leg: Edema present.     Left lower leg: Edema present.  Skin:    General: Skin is warm and dry.  Neurological:  General: No focal deficit present.     Mental Status: He is alert and oriented to person, place, and time.     Comments: Atrophy     (all labs ordered are listed, but only abnormal results are displayed) Labs Reviewed - No data to display  EKG: None  Radiology: No results found.  {Document cardiac monitor, telemetry assessment procedure when appropriate:32947} Procedures   Medications Ordered in the ED  sodium chloride  0.9 % bolus 1,000 mL (has no administration in time range)      {Click here for ABCD2, HEART and other calculators REFRESH Note before signing:1}                              Medical Decision  Making Elderly male with multiple medical problems including prior AKI, hyperlipidemia, or extremity edema, diabetes presents with fatigue.  On exam patient is awake and alert, edematous in his lower extremities with new oxygen requirement.  Broad differential including pneumonia, heart failure, organ dysfunction, less likely CNS given his preserved neurologic exam. Cardiac 80 sinus normal pulse ox 94% on 2 L nasal cannula abnormal  Amount and/or Complexity of Data Reviewed Independent Historian: EMS External Data Reviewed: notes. Labs: ordered. Decision-making details documented in ED Course. Radiology: ordered and independent interpretation performed. Decision-making details documented in ED Course. ECG/medicine tests: ordered and independent interpretation performed. Decision-making details documented in ED Course.  Risk Prescription drug management. Decision regarding hospitalization. Diagnosis or treatment significantly limited by social determinants of health.   ***  {Document critical care time when appropriate  Document review of labs and clinical decision tools ie [id number], etc  Document your independent review of radiology images and any outside records  Document your discussion with family members, caretakers and with consultants  Document social determinants of health affecting pt's care  Document your decision making why or why not admission, treatments were needed:32947:::1}   Final diagnoses:  None    ED Discharge Orders     None

## 2024-09-26 NOTE — ED Provider Notes (Signed)
  Physical Exam  BP 109/68   Pulse 80   Temp 98.1 F (36.7 C) (Oral)   Resp 14   Ht 5' 6 (1.676 m)   Wt 87.5 kg   SpO2 94%   BMI 31.15 kg/m   Physical Exam  Procedures  Procedures  ED Course / MDM   Clinical Course as of 09/26/24 1920  Wed Sep 26, 2024  1554 Assumed care from Dr Garrick. 83 yo M history of hypertension, hyperlipidemia, and PE on Eliquis  who presents with global weakness. Has new onset anemia 13>7. Hemoccult negative. Has AKI as well. Giving fluids. Getting CT to eval for obstruction. Then will need admission for anemia and AKI.  [RP]  1736 Patient had CT scan that shows multiple lytic lesions.  With his anemia and renal function concern for possible multiple myeloma.  Did send him for a CT of the chest as well.  Discussed with Randall Hope from oncology who recommends that the patient be admitted here at [place]. Will send over their recommendations on labs and will see in the morning.  [RP]  1849 Randall Hope NP has discussed with Dr. Federico and feels like he can be admitted to [facility] for hydration and blood if needed. Recommends a full myeloma workup including SPEP and free light chains along with a PSA, LDH and they will see him tomorrow.  [RP]  1919 CT of the chest shows possible pneumonia and pleural effusion.  Is also showing more lytic lesions.  Procalcitonin, blood cultures, and antibiotics ordered with his generalized fatigue in case he does have a pneumonia on top of everything else.  Dr. Sherlon from hospitalist to admit. [RP]    Clinical Course User Index [RP] Yolande Lamar BROCKS, MD   Medical Decision Making Amount and/or Complexity of Data Reviewed Labs: ordered. Radiology: ordered.  Risk Decision regarding hospitalization.      Yolande Lamar BROCKS, MD 09/26/24 RENARD

## 2024-09-26 NOTE — H&P (Signed)
 [facility] H&P   Patient Demographics:    Stephen Rogers, is a 83 y.o. male  MRN: [id number]   DOB - [birth year]  Admit Date - 09/26/2024    Chief Complaint  Patient presents with   Weakness      HPI:    Stephen Rogers  is a 83 y.o. male, with medical history significant of T2DM, hypertension, hyperlipidemia, CKD stage III , history of DVT/PE on anticoagulation. - Presents to ED secondary to complaints of weakness, failure to thrive, patient sister at bedside (retired engineer, civil (consulting) from [employer]) assists with the history, patient lives at home with his niece, he had poor ambulatory status progressive over the last few months, totally nonambulatory over the last few weeks, as well she does report urinary stool incontinence, and appetite, failure to thrive. - In ED his workup significant for AKI with a creatinine of 2.13, glucose of 224, BNP elevated at 1000, hemoglobin low at 7.2, hemoglobin was negative, CT chest/abdomen/pelvis was obtained, which was significant for widely metastatic malignancy with widespread lytic lesions in spine, pelvis and retroperitoneal and pelvic lymph nodes, with pancreatic mass, large heterogeneous prostate, pleural effusions, and multiple pulmonary nodules, [facility] consulted to admit    Review of systems:     A full 10 point Review of Systems was done, except as stated above, all other Review of Systems were negative.   With Past History of the following :    Past Medical History:  Diagnosis Date   CKD (chronic kidney disease), stage III (HCC)    Diabetes mellitus    DVT (deep venous thrombosis) (HCC) 10/14/2016   Extremity edema 09/2016   Hyperlipemia    Hypertension       Past Surgical History:  Procedure Laterality Date   CHOLECYSTECTOMY N/A 04/23/2019   Procedure: CHOLECYSTECTOMY;  Surgeon: Mavis Anes, MD;  Location: [facility];  Service:  General;  Laterality: N/A;   COLONOSCOPY  02/2010   Dr. Shaaron: diverticuolosis, multiple polyps removed, cecal adenoma.    COLONOSCOPY WITH PROPOFOL  N/A 04/19/2019   Procedure: COLONOSCOPY WITH PROPOFOL ;  Surgeon: Shaaron Lamar HERO, MD;  Location: [facility];  Service: Endoscopy;  Laterality: N/A;   IR GENERIC HISTORICAL  10/19/2016   IR PERC CHOLECYSTOSTOMY 10/19/2016 Marcey Moan, MD [facility]   IR GENERIC HISTORICAL  10/25/2016   IR CATHETER TUBE CHANGE 10/25/2016 Toribio Faes, MD [facility]   IR GENERIC HISTORICAL  11/30/2016   IR RADIOLOGIST EVAL & MGMT 11/30/2016 [facility]   PARTIAL COLECTOMY N/A 04/23/2019   Procedure: PARTIAL COLECTOMY;  Surgeon: Mavis Anes, MD;  Location: [facility];  Service: General;  Laterality: N/A;   POLYPECTOMY  04/19/2019   Procedure: POLYPECTOMY;  Surgeon: Shaaron Lamar HERO, MD;  Location: [facility];  Service: Endoscopy;;  polyp at recto sigmoid      Social History:     Social History  Tobacco Use   Smoking status: Never   Smokeless tobacco: Never  Substance Use Topics   Alcohol  use: No       Family History :     Family History  Problem Relation Age of Onset   Heart attack Other    Tuberculosis Other    Tuberculosis Other    Diabetes type II Father    Hypertension Father    Heart attack Maternal Aunt      Home Medications:   Prior to Admission medications   Medication Sig Start Date End Date Taking? Authorizing Provider  apixaban  (ELIQUIS ) 5 MG TABS tablet Take 5 mg by mouth 2 (two) times daily.   Yes [provider]  atorvastatin  (LIPITOR) 40 MG tablet Take 40 mg by mouth daily.   Yes [provider]  furosemide  (LASIX ) 20 MG tablet Take 20 mg by mouth daily as needed for edema.   Yes [provider]  hydrALAZINE  (APRESOLINE ) 100 MG tablet Take 100 mg by mouth 2 (two) times daily.   Yes [provider]  isosorbide  mononitrate (IMDUR ) 30 MG 24 hr tablet Take 30 mg by mouth daily.  03/24/23  Yes [provider]  losartan -hydrochlorothiazide (HYZAAR) 100-25 MG tablet Take 1 tablet by mouth daily. 05/27/21  Yes [provider]  metFORMIN  (GLUCOPHAGE ) 500 MG tablet Take 500 mg by mouth 2 (two) times a day. 02/28/19  Yes [provider]  metoprolol  succinate (TOPROL -XL) 50 MG 24 hr tablet Take 1 tablet (50 mg total) by mouth daily. Take with or immediately following a meal. 11/24/23 05/02/97 Yes [name], Laymon HERO, PA-C  TRADJENTA  5 MG TABS tablet Take 5 mg by mouth daily. 05/27/21  Yes [provider]  verapamil  (CALAN -SR) 240 MG CR tablet Take 240 mg by mouth daily. 05/27/21  Yes [provider]  Alcohol  Swabs (B-D SINGLE USE SWABS REGULAR) PADS Apply topically. 04/22/23   [provider]  NON FORMULARY Diet Type:  NAS, Consistent Carbohydrate, bland diet 05/04/19   [provider]  TRUE METRIX BLOOD GLUCOSE TEST test strip SMARTSIG:Via Meter 05/12/23   [provider]     Allergies:    No Known Allergies   Physical Exam:   Vitals  Blood pressure 109/68, pulse 80, temperature 97.9 F (36.6 C), temperature source Oral, resp. rate 14, height 5' 6 (1.676 m), weight 87.5 kg, SpO2 94%.   1. General frail elderly male, laying in bed, no apparent distress  2. Normal affect and insight, Not Suicidal or Homicidal, Awake Alert, Oriented X 3.  3. No F.N deficits, ALL C.Nerves Intact, significant weakness in lower extremities 4. Ears and Eyes appear Normal, Conjunctivae clear, PERRLA. Moist Oral Mucosa.  5. Supple Neck, No Carotid Bruits.  6. Symmetrical Chest wall movement, air entry at the bases with rales  7. RRR, No Gallops, Rubs or Murmurs, No Parasternal Heave.  8. Positive Bowel Sounds, Abdomen Soft, No tenderness, No organomegaly appriciated,No rebound -guarding or rigidity.  9.  No Cyanosis, Normal Skin Turgor, No Skin Rash or Bruise.  10.   joints appear normal , no effusions    Data Review:     CBC Recent Labs  Lab 09/26/24 1228  WBC 5.5  HGB 7.2*  HCT 23.1*  PLT PLATELET CLUMPS NOTED ON SMEAR, COUNT APPEARS DECREASED  MCV 98.7  MCH 30.8  MCHC 31.2  RDW 15.7*  LYMPHSABS 2.4  MONOABS 0.2  EOSABS 0.0  BASOSABS 0.1   ------------------------------------------------------------------------------------------------------------------  Chemistries  Recent Labs  Lab 09/26/24 1228  NA 140  K 4.9  CL 101  CO2 26  GLUCOSE 224*  BUN 49*  CREATININE 2.13*  CALCIUM  9.0  AST 74*  ALT 39  ALKPHOS 318*  BILITOT 0.7   ------------------------------------------------------------------------------------------------------------------ estimated creatinine clearance is 26.3 mL/min (A) (by C-G formula based on SCr of 2.13 mg/dL (H)). ------------------------------------------------------------------------------------------------------------------ No results for input(s): TSH, T4TOTAL, T3FREE, THYROIDAB in the last 72 hours.  Invalid input(s): FREET3  Coagulation profile No results for input(s): INR, PROTIME in the last 168 hours. ------------------------------------------------------------------------------------------------------------------- No results for input(s): DDIMER in the last 72 hours. -------------------------------------------------------------------------------------------------------------------  Cardiac Enzymes No results for input(s): CKMB, TROPONINI, MYOGLOBIN in the last 168 hours.  Invalid input(s): CK ------------------------------------------------------------------------------------------------------------------    Component Value Date/Time   BNP 62.7 10/12/2016 1707     ---------------------------------------------------------------------------------------------------------------  Urinalysis    Component Value Date/Time   COLORURINE YELLOW 09/26/2024 1848   APPEARANCEUR CLEAR 09/26/2024 1848   LABSPEC 1.015 09/26/2024  1848   PHURINE 5.0 09/26/2024 1848   GLUCOSEU NEGATIVE 09/26/2024 1848   HGBUR NEGATIVE 09/26/2024 1848   BILIRUBINUR NEGATIVE 09/26/2024 1848   KETONESUR NEGATIVE 09/26/2024 1848   PROTEINUR 30 (A) 09/26/2024 1848   UROBILINOGEN 0.2 01/04/2012 1646   NITRITE NEGATIVE 09/26/2024 1848   LEUKOCYTESUR NEGATIVE 09/26/2024 1848    ----------------------------------------------------------------------------------------------------------------   Imaging Results:    CT Chest Wo Contrast Result Date: 09/26/2024 EXAM: CT CHEST WITHOUT CONTRAST 09/26/2024 06:19:55 PM TECHNIQUE: CT of the chest was performed without the administration of intravenous contrast. Multiplanar reformatted images are provided for review. Automated exposure control, iterative reconstruction, and/or weight based adjustment of the mA/kV was utilized to reduce the radiation dose to as low as reasonably achievable. COMPARISON: Chest CT 06/19/2001. CLINICAL HISTORY: abnormal abdominal ct, weakness, metastatic disease. FINDINGS: MEDIASTINUM: Heart: Upper normal cardiac size. Multivessel coronary vascular calcification. No pericardial effusion. Vessels: Aortic atherosclerosis. Ectatic ascending aorta up to 3.8 cm. Airways: The central airways are clear. Other: Subcentimeter thyroid nodules, no imaging follow-up is recommended. Esophagus within normal limits. LYMPH NODES: Mildly enlarged AP window node measuring up to 13 mm. Borderline low right paratracheal node measuring 9 mm. Subcarinal nodes measuring up to 13 mm. Limited assessment of hilar nodes without contrast. No axillary lymphadenopathy. LUNGS AND PLEURA: Small to moderate bilateral pleural effusions. Partial lower lobe consolidations, either due to atelectasis or pneumonia. Diffuse septal thickening within the bilateral lungs. Peribronchovascular nodularity as well as nodularity along the pulmonary fissures. More discrete focal pulmonary nodule at the right middle lobe measuring 12  mm on series 4 image 64. Other small pulmonary nodules are noted. No pneumothorax. SOFT TISSUES/BONES: Numerous lytic lesions involving the sternum, spine, and scapula consistent with osseous metastatic disease. Possible epidural mass at the T5-T6 level. UPPER ABDOMEN: See separately dictated abdominal pelvic CT IMPRESSION: 1. Small to moderate bilateral pleural effusions. 2. Partial lower lobe consolidations, which may represent atelectasis or pneumonia. 3. Right middle lobe solid pulmonary nodule measuring 12 mm, with additional smaller pulmonary nodules raising concern for metastatic disease. Diffuse bilateral septal thickening and ground-glass density, some of which may be due to edema, however peribronchovascular nodularity and nodularity along the pulmonary fissures raising concern for neoplastic etiology / interstitial spread of disease. 4. Mild mediastinal enlargement  lymphadenopathy. 5. Numerous lytic lesions in the sternum, spine, and scapula consistent with osseous metastatic disease. 6. Possible epidural mass at T5T6; recommend dedicated spine MRI with contrast for further evaluation. Electronically signed by: Luke Bun MD 09/26/2024 06:48 PM EST RP Workstation:  [id number]   CT ABDOMEN PELVIS WO CONTRAST Result Date: 09/26/2024 EXAM: CT ABDOMEN AND PELVIS WITHOUT CONTRAST 09/26/2024 04:49:01 PM TECHNIQUE: CT of the abdomen and pelvis was performed without the administration of intravenous contrast. Multiplanar reformatted images are provided for review. Automated exposure control, iterative reconstruction, and/or weight-based adjustment of the mA/kV was utilized to reduce the radiation dose to as low as reasonably achievable. COMPARISON: Chest x-ray 09/26/2021, MRI 05/18/2019, CT 05/18/2019 CLINICAL HISTORY: Abdominal/flank pain, stone suspected. FINDINGS: LOWER CHEST: Lung bases demonstrate coronary vascular calcifications. Small bilateral pleural effusions. Partial atelectasis or pneumonia in  the lower lobes. Diffuse septal thickening, probably due to edema. Possible right middle lobe pulmonary nodule measuring 11 mm on series 8 image 2, incompletely assessed. There may be peribronchial vascular nodularity in the right middle lobe, series 8 image 2, also incompletely assessed. LIVER: The liver is unremarkable. GALLBLADDER AND BILE DUCTS: Cholecystectomy. Prominence of the common bile duct, probably due to postsurgical change. SPLEEN: No acute abnormality. PANCREAS: 19 mm soft tissue nodule anterior to the proximal pancreas on series 2 image 27, further enlarged lymph node over pancreas mass. ADRENAL GLANDS: No acute abnormality. KIDNEYS, URETERS AND BLADDER: Multiple low-density renal lesions, probably cysts, but no specific imaging follow up recommended. No stones in the kidneys or ureters. No hydronephrosis. No perinephric or periureteral stranding. The bladder is unremarkable. GI AND BOWEL: Stomach demonstrates no acute abnormality. Small fat density focus at the duodenal bulb, series 2 image 26 suggesting small lipoma. There is no bowel obstruction. PERITONEUM AND RETROPERITONEUM: No ascites. No free air. Mild presacral soft tissue stranding. VASCULATURE: Aorta is normal in caliber. Moderate aortic atherosclerosis. LYMPH NODES: Multiple enlarged retroperitoneal and pelvic lymph nodes. Left periaortic nodes up to 2.8 cm on series 2 image 33. Bilateral iliac chain nodes measuring up to 2.9 cm on the left and 2.9 cm on the right. Right pelvic node measures 2.7 cm on series 2 image 61. Left pelvic lymph node measures 2.4 cm on series 2 image 61. Right external iliac node measuring 2.9 cm on series 2 image 66. REPRODUCTIVE ORGANS: Enlarged irregular heterogeneous prostate, increased compared to prior. BONES AND SOFT TISSUES: Interval widespread lytic lesions involving the spine and pelvis, consistent with metastatic disease. Advanced multilevel degenerative changes. Suspected area of canal stenosis  T10-T11. Moderate-to-severe canal stenosis suspected at L2-L3, L3-L4, and L4-L5. IMPRESSION: 1. Widespread lytic lesions in the spine and pelvis consistent with metastatic disease. Negative for hydronephrosis or urinary tract stone 2. Multiple enlarged retroperitoneal and pelvic lymph node suspicious for metastatic disease or lymphoproliferative disease. 3. A 19 mm soft tissue nodule anterior to the proximal pancreas, suspicious for nodal metastasis less likely pancreatic mass; recommend dedicated pancreas protocol imaging as clinically appropriate. 4. Enlarged irregular heterogeneous prostate, increased compared to prior, recommend correlation with PSA. 5. Small bilateral pleural effusions with lower lobe atelectasis versus pneumonia; incompletely visualized possible right middle lobe pulmonary nodule and peribronchovascular nodularity at the right middle lobe, recommend dedicated chest CT for further assessment. 6. multilevel degenerative changes of the lower thoracic and lumbar spine with suspected areas of multilevel canal stenosis. MRI follow-up as appropriate Electronically signed by: Luke Bun MD 09/26/2024 05:15 PM EST RP Workstation: [id number]   DG Chest 2 View Result Date: 09/26/2024 EXAM: 2 VIEW(S) XRAY OF THE CHEST 09/26/2024 11:43:00 AM COMPARISON: Prior studies are available. CLINICAL HISTORY: Weakness. FINDINGS: LUNGS AND PLEURA: No focal pulmonary opacity. Mild-to-moderate bilateral pulmonary edema is noted. Probable small pleural effusions. No pneumothorax. HEART AND MEDIASTINUM: No  acute abnormality of the cardiac and mediastinal silhouettes. BONES AND SOFT TISSUES: No acute osseous abnormality. IMPRESSION: 1. Mild-to-moderate bilateral pulmonary edema. 2. Probable small bilateral pleural effusions. Electronically signed by: Lynwood Seip MD 09/26/2024 12:15 PM EST RP Workstation: [id number]       Assessment & Plan:    Principal Problem:   AKI (acute kidney injury) Active Problems:    Type 2 diabetes mellitus, controlled, with renal complications (HCC)   Mixed hyperlipidemia   Left leg DVT (HCC)   CKD stage 3 due to type 2 diabetes mellitus (HCC)   Dyslipidemia associated with type 2 diabetes mellitus (HCC)   Acute pulmonary embolism (HCC)   Essential hypertension   Obesity (BMI 30-39.9)   Physical deconditioning  Cancer, widely metastatic with bones, pancreas, prostate, lung. -very likely malignancy is related to prostate, less likely pancreatic or multiple myeloma. - Patient appears to be with mildly metastatic disease, I have discussed with patient, and sister who is a retired engineer, civil (consulting)), at this point they do not wish to pursue any further workup given advanced malignancy and poor prognosis. - Will start on as needed pain regimen including oxycodone, and Dilaudid . - As discussed with family who would like to hold on any further labs and unnecessary workup as their main focus is hospice/comfort. - Will consult palliative medicine.   AKI CKD stage III - Continue with IV fluids, will check bladder scan to rule out retention . - Check bladder scan to ensure no retention given suprapubic fullness  Anemia - Likely due to malignancy, Hemoccult negative, they do not want transfusion or any workup at this point  History of DVT/PE -continue with home Eliquis   Hypertension - Continue with Toprol -XL, but blood pressure is soft so we will hold further meds including verapamil , Hyzaar, Imdur  and hydralazine   Pneumonia - Continue with IV antibiotics, can transition to Augmentin  on discharge  Diabetes mellitus, type II - Will keep on insulin  sliding scale, will hold Tradjenta  and metformin   DVT Prophylaxis Eliquis   AM Labs Ordered, also please review Full Orders  Family Communication: Admission, patients condition and plan of care including tests being ordered have been discussed with the patient and sister who indicate understanding and agree with the plan and Code  Status.  Code Status DNR/comfort  Likely DC to home with hospice  Consults called: Palliative requested  Admission status: Inpatient  Time spent in minutes : 75 minutes   Brayton Lye M.D on 09/26/2024 at 7:41 PM   [facility] - Office  [phone]

## 2024-09-27 DIAGNOSIS — N179 Acute kidney failure, unspecified: Secondary | ICD-10-CM | POA: Diagnosis not present

## 2024-09-27 DIAGNOSIS — R918 Other nonspecific abnormal finding of lung field: Secondary | ICD-10-CM

## 2024-09-27 DIAGNOSIS — C7951 Secondary malignant neoplasm of bone: Secondary | ICD-10-CM

## 2024-09-27 DIAGNOSIS — Z515 Encounter for palliative care: Secondary | ICD-10-CM

## 2024-09-27 DIAGNOSIS — K8689 Other specified diseases of pancreas: Secondary | ICD-10-CM

## 2024-09-27 MED ORDER — CHLORHEXIDINE GLUCONATE CLOTH 2 % EX PADS
6.0000 | MEDICATED_PAD | Freq: Every day | CUTANEOUS | Status: DC
  Administered 2024-09-27 – 2024-10-02 (×5): 6 via TOPICAL

## 2024-09-27 MED ORDER — LIDOCAINE HCL URETHRAL/MUCOSAL 2 % EX GEL
1.0000 | Freq: Once | CUTANEOUS | Status: DC
  Filled 2024-09-27: qty 5

## 2024-09-27 NOTE — Evaluation (Signed)
 Physical Therapy Evaluation Patient Details Name: Stephen DYAL Sr. MRN: [id number] DOB: [birth year] Today's Date: 09/27/2024  History of Present Illness  Stephen Rogers  is a 83 y.o. male, with medical history significant of T2DM, hypertension, hyperlipidemia, CKD stage III , history of DVT/PE on anticoagulation.  - Presents to ED secondary to complaints of weakness, failure to thrive, patient sister at bedside (retired engineer, civil (consulting) from [employer]) assists with the history, patient lives at home with his niece, he had poor ambulatory status progressive over the last few months, totally nonambulatory over the last few weeks, as well she does report urinary stool incontinence, and appetite, failure to thrive.   Clinical Impression  Patient demonstrates slow labored movement for sitting up at bedside, very unsteady on feet and limited to a few side steps before having to sit due to BLE weakness and fall risk. Patient on room air during transfer with SpO2 dropping from 94% to 87% and put back on 2 LPM O2. Patient tolerated sitting up in chair after therapy. Patient will benefit from continued skilled physical therapy in hospital and recommended venue below to increase strength, balance, endurance for safe ADLs and gait.           If plan is discharge home, recommend the following: A lot of help with bathing/dressing/bathroom;A lot of help with walking and/or transfers;Help with stairs or ramp for entrance;Assist for transportation;Assistance with cooking/housework   Can travel by private vehicle   No    Equipment Recommendations None recommended by PT  Recommendations for Other Services       Functional Status Assessment Patient has had a recent decline in their functional status and demonstrates the ability to make significant improvements in function in a reasonable and predictable amount of time.     Precautions / Restrictions Precautions Precautions: Fall Recall of  Precautions/Restrictions: Intact Restrictions Weight Bearing Restrictions Per Provider Order: No      Mobility  Bed Mobility Overal bed mobility: Needs Assistance Bed Mobility: Supine to Sit     Supine to sit: Mod assist     General bed mobility comments: labored movement    Transfers Overall transfer level: Needs assistance Equipment used: Rolling walker (2 wheels) Transfers: Sit to/from Stand, Bed to chair/wheelchair/BSC Sit to Stand: Mod assist   Step pivot transfers: Mod assist       General transfer comment: unsteady labored movement    Ambulation/Gait Ambulation/Gait assistance: Mod assist, Max assist Gait Distance (Feet): 4 Feet Assistive device: Rolling walker (2 wheels) Gait Pattern/deviations: Decreased step length - left, Decreased stance time - right, Decreased stride length Gait velocity: slow     General Gait Details: limited to a few slow labored side steps before having to sit due to weakness, fatigue  Stairs            Wheelchair Mobility     Tilt Bed    Modified Rankin (Stroke Patients Only)       Balance Overall balance assessment: Needs assistance Sitting-balance support: Feet supported, No upper extremity supported Sitting balance-Leahy Scale: Fair Sitting balance - Comments: fair/good seated at EOB   Standing balance support: Reliant on assistive device for balance, During functional activity, Bilateral upper extremity supported Standing balance-Leahy Scale: Poor Standing balance comment: using RW                             Pertinent Vitals/Pain Pain Assessment Pain Assessment: No/denies pain    Home  Living Family/patient expects to be discharged to:: Private residence Living Arrangements: Children Available Help at Discharge: Available 24 hours/day;Family Type of Home: House Home Access: Level entry       Home Layout: One level Home Equipment: Agricultural Consultant (2 wheels);Cane - single  point;BSC/3in1;Shower seat - built in;Shower seat      Prior Function Prior Level of Function : Needs assist       Physical Assist : Mobility (physical);ADLs (physical) Mobility (physical): Bed mobility;Transfers;Gait;Stairs   Mobility Comments: household ambulaton using RW ADLs Comments: Assisted by family     Extremity/Trunk Assessment   Upper Extremity Assessment Upper Extremity Assessment: Generalized weakness    Lower Extremity Assessment Lower Extremity Assessment: Generalized weakness    Cervical / Trunk Assessment Cervical / Trunk Assessment: Kyphotic  Communication   Communication Communication: No apparent difficulties    Cognition Arousal: Alert Behavior During Therapy: WFL for tasks assessed/performed   PT - Cognitive impairments: No apparent impairments                         Following commands: Intact       Cueing Cueing Techniques: Verbal cues, Tactile cues     General Comments      Exercises     Assessment/Plan    PT Assessment Patient needs continued PT services  PT Problem List Decreased strength;Decreased activity tolerance;Decreased balance;Decreased mobility       PT Treatment Interventions DME instruction;Gait training;Stair training;Functional mobility training;Therapeutic activities;Therapeutic exercise;Balance training;Patient/family education    PT Goals (Current goals can be found in the Care Plan section)  Acute Rehab PT Goals Patient Stated Goal: return home with family assist PT Goal Formulation: With patient Time For Goal Achievement: 10/11/24 Potential to Achieve Goals: Good    Frequency Min 3X/week     Co-evaluation               AM-PAC PT 6 Clicks Mobility  Outcome Measure Help needed turning from your back to your side while in a flat bed without using bedrails?: A Lot Help needed moving from lying on your back to sitting on the side of a flat bed without using bedrails?: A Lot Help needed  moving to and from a bed to a chair (including a wheelchair)?: A Lot Help needed standing up from a chair using your arms (e.g., wheelchair or bedside chair)?: A Lot Help needed to walk in hospital room?: A Lot Help needed climbing 3-5 steps with a railing? : Total 6 Click Score: 11    End of Session Equipment Utilized During Treatment: Oxygen Activity Tolerance: Patient tolerated treatment well;Patient limited by fatigue Patient left: in chair;with call bell/phone within reach Nurse Communication: Mobility status PT Visit Diagnosis: Unsteadiness on feet (R26.81);Other abnormalities of gait and mobility (R26.89);Muscle weakness (generalized) (M62.81)    Time: 8963-8888 PT Time Calculation (min) (ACUTE ONLY): 35 min   Charges:   PT Evaluation $PT Eval Moderate Complexity: 1 Mod PT Treatments $Therapeutic Activity: 23-37 mins PT General Charges $$ ACUTE PT VISIT: 1 Visit         2:38 PM, 09/27/24 Lynwood Music, MPT Physical Therapist with [facility] [phone] office [phone] mobile phone

## 2024-09-27 NOTE — Progress Notes (Signed)
   09/27/24 1359  Spiritual Encounters  Type of Visit Initial;Attempt (pt unavailable)  Referral source Clinical staff  Reason for visit End-of-life  OnCall Visit No   Chaplain attempting to respond to Spiritual Consult stating Pt is at EOL.  Pt was asleep and no support persons were present in the room.

## 2024-09-27 NOTE — Plan of Care (Signed)
  Problem: Acute Rehab PT Goals(only PT should resolve) Goal: Pt Will Go Supine/Side To Sit Outcome: Progressing Flowsheets (Taken 09/27/2024 1441) Pt will go Supine/Side to Sit: with minimal assist Goal: Patient Will Transfer Sit To/From Stand Outcome: Progressing Flowsheets (Taken 09/27/2024 1441) Patient will transfer sit to/from stand: with minimal assist Goal: Pt Will Transfer Bed To Chair/Chair To Bed Outcome: Progressing Flowsheets (Taken 09/27/2024 1441) Pt will Transfer Bed to Chair/Chair to Bed: with min assist Goal: Pt Will Ambulate Outcome: Progressing Flowsheets (Taken 09/27/2024 1441) Pt will Ambulate:  15 feet  with minimal assist  with moderate assist  with rolling walker   2:41 PM, 09/27/24 Lynwood Music, MPT Physical Therapist with [facility] [phone] office [phone] mobile phone

## 2024-09-27 NOTE — Significant Event (Addendum)
       CROSS COVER NOTE  NAME: Stephen BENASSI Sr. MRN: [id number] DOB : February 17, 1941 ATTENDING PHYSICIAN: Elgergawy, Brayton RAMAN, MD    Date of Service   09/27/2024   HPI/Events of Note   [facility] CROSS COVER FOR [place]: Message received from nurse via secure chat: Good morning. This patient came here with AKI from home and his sister helps take care of him. He is usually incontinent and wears briefs. He hadn't voided for me tonight so I just now did a bladder scan. He has 379 in his bladder. Patient did have some blood come from the tip of his penis. But he also had newer cancer diagnosis  HPI Patient from home to ED with complaints of weakness.failure to thrive. Niece and sister help care for him at home. Work up has revealed AKI, with elevated BNP and CT chest abdomen pelvis that has showed widely metastatic malignancy with widespread lesions in spine, pelvis, retroperitoneal and pelvic lymph nodes, with pancreatic mass, large heterogeneous prostate, pleural effusion, and multiple pulmonary nodules   Interventions   Assessment/Plan:  Acute Urinary Retention, cannot r/o outlet obstruction from clotting or mass -urogel for foley insertion -insert urinary catheter        Erminio LITTIE Cone NP [facility] Cross Cover 7pm-7am - check amion for availability Pager [phone]

## 2024-09-27 NOTE — TOC Initial Note (Signed)
 Transition of Care (TOC) - Initial/Assessment Note    Patient Details  Name: Stephen Rogers. MRN: [id number] Date of Birth: 10/17/1941  Transition of Care [facility]) CM/SW Contact:    Hoy DELENA Bigness, LCSW Phone Number: 09/27/2024, 3:19 PM  Clinical Narrative:                 Pt recommended for home hospice services. Pt from home alone. Pt's sister lives next door provides assistance as needed. Pt has hospital bed, over bed table, and BSC already in the home. Pt/family agreeable to recommendation for home hospice and do not have an agency preference. Pt will need wheelchair and home O2 for comfort at discharge. Pt elected to have his sister be the main POC. Referral made to [facility] for home hospice.   Expected Discharge Plan: Home w Hospice Care Barriers to Discharge: No Barriers Identified   Patient Goals and CMS Choice Patient states their goals for this hospitalization and ongoing recovery are:: To return home with hospice services CMS Medicare.gov Compare Post Acute Care list provided to:: Patient Choice offered to / list presented to : Patient, Sibling, Adult Children [place] ownership interest in [facility].provided to::  (NA)    Expected Discharge Plan and Services In-house Referral: Clinical Social Work Discharge Planning Services: NA Post Acute Care Choice: Durable Medical Equipment, Hospice Living arrangements for the past 2 months: Single Family Home                 DME Arranged: Wheelchair manual, Oxygen DME Agency: [facility]                  Prior Living Arrangements/Services Living arrangements for the past 2 months: Single Family Home Lives with:: Self (Sister lives next door) Patient language and need for interpreter reviewed:: Yes Do you feel safe going back to the place where you live?: Yes      Need for Family Participation in Patient Care: Yes (Comment) Care giver support system in place?: Yes  (comment) Current home services: DME (hospital bed, cane, over bed table, BSC) Criminal Activity/Legal Involvement Pertinent to Current Situation/Hospitalization: No - Comment as needed  Activities of Daily Living   ADL Screening (condition at time of admission) Independently performs ADLs?: Yes (appropriate for developmental age)  Permission Sought/Granted Permission sought to share information with : Facility Medical Sales Representative, Family Supports Permission granted to share information with : Yes, Verbal Permission Granted     Permission granted to share info w AGENCY: [facility]  Permission granted to share info w Relationship: Sister and daughter     Emotional Assessment Appearance:: Appears stated age Attitude/Demeanor/Rapport: Engaged Affect (typically observed): Accepting Orientation: : Oriented to Self, Oriented to Place, Oriented to  Time, Oriented to Situation Alcohol  / Substance Use: Not Applicable Psych Involvement: No (comment)  Admission diagnosis:  AKI (acute kidney injury) [N17.9] Patient Active Problem List   Diagnosis Date Noted   Weakness 06/23/2023   Physical deconditioning 06/23/2023   Orthostasis 06/23/2023   Urinary tract infection 06/21/2023   Lactic acidosis 06/21/2023   Hypoalbuminemia due to protein-calorie malnutrition 06/21/2023   Essential hypertension 06/21/2023   Type 2 diabetes mellitus with hyperglycemia (HCC) 06/21/2023   Obesity (BMI 30-39.9) 06/21/2023   Acute pulmonary embolism (HCC) 06/20/2023   Elevated liver enzymes 05/15/2019   Intra-abdominal abscess (HCC) 05/15/2019   CKD stage 3 due to type 2 diabetes mellitus (HCC) 05/07/2019   Dyslipidemia associated with type 2 diabetes mellitus (  HCC) 05/07/2019   Hypertension associated with stage 3 chronic kidney disease due to type 2 diabetes mellitus (HCC) 05/07/2019   GERD without esophagitis 05/07/2019   Calculus of gallbladder with cholecystitis without biliary obstruction     Choloenteric fistula    History of cholecystitis    Rectal bleeding 04/18/2019   Anemia 04/18/2019   Acute GI bleeding    Cholecystitis    Fever    At high risk for fluid overload 10/13/2016   Fluid overload 10/13/2016   Bilateral lower extremity edema 10/13/2016   Gait disturbance 10/13/2016   Leukocytosis 10/13/2016   CKD (chronic kidney disease), stage III (HCC) 10/13/2016   Left leg DVT (HCC) 10/13/2016   AKI (acute kidney injury) 10/08/2016   Nausea and vomiting 10/08/2016   Type 2 diabetes mellitus, controlled, with renal complications (HCC) 10/08/2016   Transaminitis 10/08/2016   Mixed hyperlipidemia 10/08/2016   Renal insufficiency    Dehydration 10/07/2016   PCP:  Carlette Benita Area, MD Pharmacy:   [facility] - [place], [place] - [address] [place] [address] Phone: [phone] Fax: [phone]  [facility] [address], [place] - [address] [place] [address] [place] [address] [place] [address] Phone: [phone] Fax: [phone]  [facility] [address] [place] [address] Phone: [phone] Fax: [phone]     Social Drivers of Health (SDOH) Social History: SDOH Screenings   Food Insecurity: No Food Insecurity (09/26/2024)  Housing: Unknown (09/26/2024)  Transportation Needs: No Transportation Needs (09/26/2024)  Utilities: Not At Risk (09/26/2024)  Depression (PHQ2-9): Low Risk  (08/01/2020)  Social Connections: Unknown (09/26/2024)  Tobacco Use: Low Risk  (09/26/2024)   SDOH Interventions:     Readmission Risk Interventions    09/27/2024    3:03 PM  Readmission Risk Prevention Plan  Transportation Screening Complete  PCP or Specialist Appt within 5-7 Days Complete  Home Care Screening Complete  Medication Review (RN CM) Complete

## 2024-09-27 NOTE — Consult Note (Signed)
 Consultation Note Date: 09/27/2024   Patient Name: Stephen GIN Sr.  DOB: 09/04/1941  MRN: [id number]  Age / Sex: 83 y.o., male  PCP: Carlette Benita Area, MD Referring Physician: Bryn Bernardino NOVAK, MD  Reason for Consultation:  metastatic cancer, family wants hospice at home  HPI/Patient Profile: 83 y.o. male  with past medical history of HLD, AKI, DM admitted on 09/26/2024 with weakness and anemia. Workup reveals widespread skeletal lesions, pancreatic mass, large prostate, lung nodules- likely widespread metastatic cancer. Palliative consulted due to patient wishes to go home with hospice.   Primary Decision Maker PATIENT- and his sister Nena Agar and daughter Gailen Lime  Discussion: Chart reviewed including labs, progress notes, imaging from this and previous encounters.  Hgb on admission 7.2. PBNP up 1,008. CT CAP scan reviewed- significant for soft tissue mass on pancreas, diffuse lymphadenopathy, lytic lesions of spine and pelvis, scapula and sternum, pulmonary nodules.  On eval patient sitting up awake and alert. Lived at home prior to admission with his sister living next door. His sister is a retired engineer, civil (consulting) and he trusts her for assistance with making his medical decisions.  His spouse died in 2016-10-02. Has a son who is deceased. Two daughters living.  He shares that he understands he has cancer. He has multiple family members who have died before him with cancer. He notes that he has lived a good life full of family and love. He doesn't wish to pursue further workup or treatment for his cancer.  His hopes are to go home and remain home until end of life. He denies any pain. He doesn't have any worries.  I called and spoke with his sister Nena. She is in agreement with plan of care. Wishes for patient to discharge home with hospice. Wants to continue IV fluids until discharge. Demographic  information was updated by Nena- patient's listed contact Carlin Ochs, Mickey is deceased. Daughter Gailen Janus information was added.  I attempted to call Gailen, message left with return call number. Per Nena Gailen is aware of plan and in agreement.   SUMMARY OF RECOMMENDATIONS -Newly found metastatic cancer- no further workup or treatment desired, d/c home with hospice -Pneumonia- recommend transition to oral antibiotic to facilitate discharge -Generalized weakness- continue IV fluids until discharge -On discharge, would recommend scripts for emergency symptom management even though patient not currently having symptoms-  - Oxycodone Concentrate 20mg /ml: 5mg  (0.86ml) sublingual every 1 hour as needed for pain or shortness of breath: Disp 30ml - Lorazepam  2mg /ml concentrated solution: 1mg  (0.67ml) sublingual every 4 hours as needed for anxiety: Disp 30ml - Haldol 2mg /ml solution: 0.5mg  (0.25ml) sublingual every 4 hours as needed for agitation or nausea: Disp 30ml    Code Status/Advance Care Planning:   Code Status: Do not attempt resuscitation (DNR) - Comfort care    Prognosis:   < 6 months  Discharge Planning: Home with Hospice  Primary Diagnoses: Present on Admission:  Type 2 diabetes mellitus, controlled, with renal complications (HCC)  Physical deconditioning  Obesity (BMI  30-39.9)  Mixed hyperlipidemia  Left leg DVT (HCC)  Essential hypertension  Dyslipidemia associated with type 2 diabetes mellitus (HCC)  CKD stage 3 due to type 2 diabetes mellitus (HCC)  AKI (acute kidney injury)  Acute pulmonary embolism (HCC)   Review of Systems  Physical Exam  Vital Signs: BP (!) 154/66 (BP Location: Left Arm)   Pulse 89   Temp 97.8 F (36.6 C) (Oral)   Resp 18   Ht 5' 6 (1.676 m)   Wt 88.9 kg   SpO2 98%   BMI 31.63 kg/m  Pain Scale: 0-10   Pain Score: 0-No pain   SpO2: SpO2: 98 % O2 Device:SpO2: 98 % O2 Flow Rate: .O2 Flow Rate (L/min): 2  L/min  IO: Intake/output summary:  Intake/Output Summary (Last 24 hours) at 09/27/2024 1229 Last data filed at 09/27/2024 0435 Gross per 24 hour  Intake 333.24 ml  Output 350 ml  Net -16.76 ml    LBM: Last BM Date : 09/25/24 Baseline Weight: Weight: 87.5 kg Most recent weight: Weight: 88.9 kg       Thank you for this consult. Palliative medicine will continue to follow and assist as needed.   Signed by: Cassondra Stain, AGNP-C Palliative Medicine  Time includes:   Preparing to see the patient (e.g., review of tests) Obtaining and/or reviewing separately obtained history Performing a medically necessary appropriate examination and/or evaluation Counseling and educating the patient/family/caregiver Ordering medications, tests, or procedures Referring and communicating with other health care professionals (when not reported separately) Documenting clinical information in the electronic or other health record Independently interpreting results (not reported separately) and communicating results to the patient/family/caregiver Care coordination (not reported separately) Clinical documentation   Please contact Palliative Medicine Team phone at [phone] for questions and concerns.  For individual provider: See [place]

## 2024-09-27 NOTE — Progress Notes (Signed)
 [facility] Liaison Note  Received request from [facility] for hospice services at home after discharge. Spoke with patient's sister Nena to initiate education related to hospice philosophy, services and team approach to care. Nena verbalized understanding of information given. Per discussion, the plan is for discharge home by EMS  tomorrow.   DME needs discussed. Patient has the following equipment in the home: hospital bed, overbed table, BSC. Family requests the following equipment for delivery: wheelchair, oxygen.  Please send signed and completed DNR home with patient/family. Please provide prescriptions at discharge as needed to ensure ongoing symptom management.  [facility] information and contact numbers given to [place]. Please call with any concerns.  Thank you for the opportunity to participate in this patient's care.   Eleanor Nail, LPN [facility] Liaison [phone]

## 2024-09-27 NOTE — Progress Notes (Signed)
 [facility] PROGRESS NOTE  Stephen PULLIAM Sr. (DOB: Apr 03, 1941) FMW:[id number] PCP: Stephen Rogers  Brief Narrative: Stephen FORBES Ochs Sr. is an 83 y.o. male with a history of T2DM, HTN, HLD, stage IIIb CKD, hx DVT/PE on eliquis  who presented to the ED on 09/26/2024 with generalized weakness, worsening immobility, incontinence, poor appetite, suspicious for failure to thrive. Work up revealed AKI, anemia with negative FOBT, and hyperglycemia. CT chest/abdomen/pelvis was obtained, which was significant for widely metastatic malignancy with widespread lytic lesions in spine, pelvis and retroperitoneal and pelvic lymph nodes, with pancreatic mass, large heterogeneous prostate, pleural effusions, and multiple pulmonary nodules. He was given IV fluids. Goals of care discussions initiated, plan is to not pursue further work up or intervention for suspected widespread malignancy. Palliative care is consulted as well.   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 Completely denies any pain any where. Denies nausea or vomiting, but not eating much. He reports having had some shortness of breath and mild cough, though is better since oxygen has been applied.   Objective: BP (!) 154/66 (BP Location: Left Arm)   Pulse 89   Temp 97.8 F (36.6 C) (Oral)   Resp 18   Ht 5' 6 (1.676 m)   Wt 88.9 kg   SpO2 98%   BMI 31.63 kg/m   Gen: Elderly but nontoxic male in no distress Pulm: Coarse at bases, no wheezes, nonlabored with supplemental oxygen.   CV: RRR, 1+ LE edema GI: Soft, NT, ND, +BS  Neuro: Alert and oriented.   Ext: Warm, no deformities Skin: No open wounds on visualized skin   Assessment & Plan: Widely metastatic cancer involving bones, pancreas, prostate, lung. - Suspected poor prognosis was discussed with patient, and sister who is a retired engineer, civil (consulting). Thus far, the plan has been to pursue a palliative approach.  Palliative care formally consulted to assist with discussions and resources. - Continue prns for comfort care and avoiding further laboratory or other diagnostic interventions (e.g. spine MRI)   Acute hypoxemia, pneumonia: Possibly related to pulmonary nodule, suspected interstitial spread of malignancy, and complicated by atelectasis. Cannot r/o pneumonia.  - Continue typical abx for now.  - Continue supplemental oxygen  AKI CKD stage III: Prerenal azotemia was suspected, also noted to have urinary retention for which foley catheter was placed 11/6.  - Check bladder scan to ensure no retention given suprapubic fullness   Anemia - Likely due to malignancy. No gross bleeding and FOBT was negative. No interventions (including endoscopies, or even repeat labs/IV iron, or transfusions) are desired. Not planning to monitor.   History of DVT/PE - Continue eliquis , at heightened risk with malignancy as well.    Hypertension - Continuing metoprolol , holding others for now    T2DM: - SSI. Holding home Tx, will discontinue metformin  at DC.   Stephen KATHEE Come, Rogers [facility] [email or web address] 09/27/2024, 11:36 AM

## 2024-09-28 DIAGNOSIS — N179 Acute kidney failure, unspecified: Secondary | ICD-10-CM | POA: Diagnosis not present

## 2024-09-28 MED ORDER — SALINE SPRAY 0.65 % NA SOLN
1.0000 | Freq: Two times a day (BID) | NASAL | Status: DC
  Administered 2024-09-28 – 2024-10-02 (×8): 1 via NASAL
  Filled 2024-09-28 (×2): qty 44

## 2024-09-28 NOTE — Plan of Care (Signed)
   Problem: Education: Goal: Knowledge of General Education information will improve Description: Including pain rating scale, medication(s)/side effects and non-pharmacologic comfort measures Outcome: Progressing   Problem: Coping: Goal: Level of anxiety will decrease Outcome: Progressing

## 2024-09-28 NOTE — Progress Notes (Signed)
 [facility] Liaison Note   [facility] continues to follow for discharge disposition for hospice services at home.     Please call with any hospice related questions or concerns.   Henderson Newcomer, LPN [facility] Liaison [phone]

## 2024-09-28 NOTE — Care Management Important Message (Signed)
 Important Message  Patient Details  Name: Stephen SANGALANG Sr. MRN: [id number] Date of Birth: 09-11-1941   Important Message Given:  Yes - Medicare IM     Tacuma Graffam L Verdell Dykman 09/28/2024, 11:16 AM

## 2024-09-28 NOTE — Progress Notes (Signed)
 [facility] PROGRESS NOTE  Stephen COWARD Sr. (DOB: October 10, 1941) FMW:[id number] PCP: Carlette Benita Area, MD  Brief Narrative: Stephen FORBES Ochs Sr. is an 83 y.o. male with a history of T2DM, HTN, HLD, stage IIIb CKD, hx DVT/PE on eliquis  who presented to the ED on 09/26/2024 with generalized weakness, worsening immobility, incontinence, poor appetite, suspicious for failure to thrive. Work up revealed AKI, anemia with negative FOBT, and hyperglycemia. CT chest/abdomen/pelvis was obtained, which was significant for widely metastatic malignancy with widespread lytic lesions in spine, pelvis and retroperitoneal and pelvic lymph nodes, with pancreatic mass, large heterogeneous prostate, pleural effusions, and multiple pulmonary nodules. He was given IV fluids. Goals of care discussions initiated, plan is to not pursue further work up or intervention for suspected widespread malignancy. Palliative care is consulted as well.   Subjective: No new complaints, but has noticed bleeding from right nose overnight which subsided without intervention. Eager to return home but understands need to monitor bleeding.   Objective: BP (!) 149/69 (BP Location: Left Arm)   Pulse 100   Temp 98.2 F (36.8 C) (Oral)   Resp 14   Ht 5' 6 (1.676 m)   Wt 88.9 kg   SpO2 94%   BMI 31.63 kg/m   Gen: Elderly tired appearing male in no distress HEENT: Right nare hemostatic but stigmata of recent bleeding.  Pulm: Nonlabored, clearing at bases, still Rogers crackles CV: RRR, trace edema GI: Soft, NT, ND, +BS, no suprapubic fullness or tenderness.  GU: No bleeding from NEMG, but light red bleeding into foley bag.  Neuro: Alert, oriented.  Assessment & Plan: Widely metastatic cancer involving bones, pancreas, prostate, lung. - Suspected poor prognosis was discussed with patient, and sister who is a retired engineer, civil (consulting). Plan is to pursue comfort measures, planning/arranging discharge home with hospice care.  - Continue  prns for comfort care (albeit not requiring any at this time) and avoiding further laboratory or other diagnostic interventions (e.g. spine MRI)   Acute hypoxemia, pneumonia: Possibly related to pulmonary nodule, suspected interstitial spread of malignancy, and complicated by atelectasis. Cannot r/o pneumonia.  - Continue typical abx for now.  - Continue supplemental oxygen, add nasal saline  AKI on CKD stage III: Prerenal azotemia was suspected, also noted to have urinary retention for which foley catheter was placed 11/6. - Avoid nephrotoxins, will not plan repeat labs.    Anemia - Likely due to malignancy. FOBT was negative. No interventions (including endoscopies, or even repeat labs/IV iron, or transfusions) are desired. Not planning to monitor.   History of DVT/PE now with hematuria s/p foley insertion and self-limited epistaxis. Platelets clumped but appear low by smear. - Given ongoing hematuria, plan of care discussed with patient and family who all agree to stop DOAC despite heightened risk of VTE with malignancy    Hypertension - Continuing metoprolol , holding others for now    T2DM: - SSI. Holding home Tx, will discontinue metformin  at DC.   Stephen KATHEE Come, MD [facility] [email or web address] 09/28/2024, 11:15 AM

## 2024-09-29 DIAGNOSIS — D62 Acute posthemorrhagic anemia: Secondary | ICD-10-CM

## 2024-09-29 DIAGNOSIS — N179 Acute kidney failure, unspecified: Secondary | ICD-10-CM | POA: Diagnosis not present

## 2024-09-29 LAB — CBC
HCT: 24.5 % — ABNORMAL LOW (ref 39.0–52.0)
Hemoglobin: 7.9 g/dL — ABNORMAL LOW (ref 13.0–17.0)
MCH: 28.5 pg (ref 26.0–34.0)
MCHC: 32.2 g/dL (ref 30.0–36.0)
MCV: 88.4 fL (ref 80.0–100.0)
Platelets: 41 K/uL — ABNORMAL LOW (ref 150–400)
RBC: 2.77 MIL/uL — ABNORMAL LOW (ref 4.22–5.81)
RDW: 18.5 % — ABNORMAL HIGH (ref 11.5–15.5)
WBC: 6.1 K/uL (ref 4.0–10.5)
nRBC: 20 % — ABNORMAL HIGH (ref 0.0–0.2)

## 2024-09-29 LAB — BASIC METABOLIC PANEL WITH GFR
Anion gap: 11 (ref 5–15)
BUN: 27 mg/dL — ABNORMAL HIGH (ref 8–23)
CO2: 24 mmol/L (ref 22–32)
Calcium: 8.8 mg/dL — ABNORMAL LOW (ref 8.9–10.3)
Chloride: 105 mmol/L (ref 98–111)
Creatinine, Ser: 1.1 mg/dL (ref 0.61–1.24)
GFR, Estimated: >60 mL/min (ref >60)
Glucose, Bld: 316 mg/dL — ABNORMAL HIGH (ref 70–99)
Potassium: 4.2 mmol/L (ref 3.5–5.1)
Sodium: 140 mmol/L (ref 135–145)

## 2024-09-29 LAB — PREPARE RBC (CROSSMATCH)

## 2024-09-29 MED ORDER — SODIUM CHLORIDE 0.9% IV SOLUTION: Freq: Once | INTRAVENOUS | Status: AC

## 2024-09-29 NOTE — Plan of Care (Signed)
   Problem: Education: Goal: Knowledge of General Education information will improve Description: Including pain rating scale, medication(s)/side effects and non-pharmacologic comfort measures Outcome: Progressing   Problem: Activity: Goal: Risk for activity intolerance will decrease Outcome: Progressing   Problem: Nutrition: Goal: Adequate nutrition will be maintained Outcome: Progressing

## 2024-09-29 NOTE — Plan of Care (Signed)
  Problem: Education: Goal: Knowledge of General Education information will improve Description: Including pain rating scale, medication(s)/side effects and non-pharmacologic comfort measures Outcome: Progressing   Problem: Pain Managment: Goal: General experience of comfort will improve and/or be controlled Outcome: Progressing

## 2024-09-29 NOTE — Progress Notes (Signed)
 [facility] PROGRESS NOTE  SANFORD LINDBLAD Sr. (DOB: 21-May-1941) FMW:[id number] PCP: Carlette Benita Area, MD  Brief Narrative: Carlin FORBES Ochs Sr. is an 83 y.o. male with a history of T2DM, HTN, HLD, stage IIIb CKD, hx DVT/PE on eliquis  who presented to the ED on 09/26/2024 with generalized weakness, worsening immobility, incontinence, poor appetite, suspicious for failure to thrive. Work up revealed AKI, anemia with negative FOBT, and hyperglycemia. CT chest/abdomen/pelvis was obtained, which was significant for widely metastatic malignancy with widespread lytic lesions in spine, pelvis and retroperitoneal and pelvic lymph nodes, with pancreatic mass, large heterogeneous prostate, pleural effusions, and multiple pulmonary nodules. He was given IV fluids. Goals of care discussions initiated, plan is to not pursue further work up or intervention for suspected widespread malignancy. Palliative care is consulted as well.   Foley catheter inserted due to urinary retention, initial labs also notable for anemia and clumped platelets. Eliquis  was stopped, but the patient has continued to have some hematuria. GOC discussions revisited, pt/family opted to pursue transfusion if that became necessary, so CBC was rechecked confirming severe thrombocytopenia and progressive anemia. RBCs and platelet transfusions ordered.   Subjective: Family at bedside and discussed by phone this morning. Pt reports no pain, but does feel more weak generally. No chest pain or dyspnea or palpitations.   Objective: BP 136/70 (BP Location: Right Arm)   Pulse (!) 117   Temp 98 F (36.7 C) (Oral)   Resp 17   Ht 5' 6 (1.676 m)   Wt 88.9 kg   SpO2 96%   BMI 31.63 kg/m   Gen: No distress, elderly male Pulm: Clear, nonlabored  CV: Regular tachycardia (HR up over past 12 hours) GI: Soft, NT, ND, +BS GU: Red output in foley without clots, good output continues Neuro: Alert and oriented. No new focal deficits. Ext:  Warm, no deformities. Skin: No rashes, lesions or ulcers on visualized skin   Assessment & Plan: Widely metastatic cancer involving bones, pancreas, prostate, lung. - Suspected poor prognosis was discussed with patient, and sister who is a retired engineer, civil (consulting). Plan is to pursue comfort measures, planning/arranging discharge home with hospice care.  - Continue prns for comfort care (albeit not requiring any at this time) and avoiding further diagnostics and interventions (e.g. spine MRI)   Acute hypoxemia, pneumonia: Possibly related to pulmonary nodule, suspected interstitial spread of malignancy, and complicated by atelectasis. Cannot r/o pneumonia.  - Continue typical abx for now. Day 4 of 5 today. - Continue supplemental oxygen, add nasal saline  AKI on CKD stage III: Prerenal azotemia was suspected, also noted to have urinary retention for which foley catheter was placed 11/6. - Avoid nephrotoxins, will recheck per evolving GOC discussions   ABLA due to hematuria on chronic anemia due to malignancy/chronic disease:  - Hgb trending downward, also thrombocytopenia quantified on CBC today. This was drawn after confirmation that the patient/family would consent to blood transfusions, so will now give 2u RBCs and 1u platelets, continue holding anticoagulation.    History of DVT/PE now with hematuria s/p foley insertion and self-limited epistaxis. - Given ongoing hematuria, plan of care discussed with patient and family who all agree to stop DOAC despite heightened risk of VTE with malignancy    Hypertension - Continuing metoprolol , holding others for now    T2DM: - SSI. Holding home Tx, will discontinue metformin  at DC.   [name], MD [facility] [email or web address] 09/29/2024, 11:10 AM

## 2024-09-30 DIAGNOSIS — N179 Acute kidney failure, unspecified: Secondary | ICD-10-CM | POA: Diagnosis not present

## 2024-09-30 DIAGNOSIS — D62 Acute posthemorrhagic anemia: Secondary | ICD-10-CM | POA: Diagnosis not present

## 2024-09-30 LAB — BPAM RBC
Blood Product Expiration Date: 202512122359
Blood Product Expiration Date: 202512122359
ISSUE DATE / TIME: 202511081324
ISSUE DATE / TIME: 202511081601
Unit Type and Rh: 5100
Unit Type and Rh: 5100

## 2024-09-30 LAB — TYPE AND SCREEN
ABO/RH(D): B POS
Antibody Screen: NEGATIVE
Unit division: 0
Unit division: 0

## 2024-09-30 LAB — PREPARE PLATELET PHERESIS: Unit division: 0

## 2024-09-30 LAB — BPAM PLATELET PHERESIS
Blood Product Expiration Date: 202511102359
ISSUE DATE / TIME: 202511081931
Unit Type and Rh: 5100

## 2024-09-30 LAB — CBC
HCT: 24.3 % — ABNORMAL LOW (ref 39.0–52.0)
Hemoglobin: 7.9 g/dL — ABNORMAL LOW (ref 13.0–17.0)
MCH: 28.7 pg (ref 26.0–34.0)
MCHC: 32.5 g/dL (ref 30.0–36.0)
MCV: 88.4 fL (ref 80.0–100.0)
Platelets: 32 K/uL — ABNORMAL LOW (ref 150–400)
RBC: 2.75 MIL/uL — ABNORMAL LOW (ref 4.22–5.81)
RDW: 18.6 % — ABNORMAL HIGH (ref 11.5–15.5)
WBC: 6.3 K/uL (ref 4.0–10.5)
nRBC: 17.2 % — ABNORMAL HIGH (ref 0.0–0.2)

## 2024-09-30 LAB — PREPARE RBC (CROSSMATCH)

## 2024-09-30 MED ORDER — SODIUM CHLORIDE 0.9% IV SOLUTION: Freq: Once | INTRAVENOUS | Status: AC

## 2024-09-30 MED ORDER — SODIUM CHLORIDE 0.9 % IV SOLN
2.0000 g | INTRAVENOUS | Status: AC
  Administered 2024-09-30: 2 g via INTRAVENOUS
  Filled 2024-09-30: qty 20

## 2024-09-30 MED ORDER — SODIUM CHLORIDE 0.9 % IV SOLN
500.0000 mg | INTRAVENOUS | Status: AC
  Administered 2024-09-30: 500 mg via INTRAVENOUS
  Filled 2024-09-30: qty 5

## 2024-09-30 NOTE — Progress Notes (Signed)
 [facility] PROGRESS NOTE  Stephen SERVELLON Sr. (DOB: [birth year]) FMW:[id number] PCP: Carlette Benita Area, MD  Brief Narrative: Stephen FORBES Ochs Sr. is an 83 y.o. male with a history of T2DM, HTN, HLD, stage IIIb CKD, hx DVT/PE on eliquis  who presented to the ED on 09/26/2024 with generalized weakness, worsening immobility, incontinence, poor appetite, suspicious for failure to thrive. Work up revealed AKI, anemia with negative FOBT, and hyperglycemia. CT chest/abdomen/pelvis was obtained, which was significant for widely metastatic malignancy with widespread lytic lesions in spine, pelvis and retroperitoneal and pelvic lymph nodes, with pancreatic mass, large heterogeneous prostate, pleural effusions, and multiple pulmonary nodules. He was given IV fluids. Goals of care discussions initiated, plan is to not pursue further work up or intervention for suspected widespread malignancy. Palliative care is consulted as well.   Foley catheter inserted due to urinary retention, initial labs also notable for anemia and clumped platelets. Eliquis  was stopped, but the patient has continued to have some hematuria. GOC discussions revisited, pt/family opted to pursue transfusion if that became necessary, so CBC was rechecked confirming severe thrombocytopenia and progressive anemia. RBCs and platelet transfusions ordered on 11/8 and 11/9.   Subjective: No family at bedside, feels less weak. Wants to go home, but doesn't want to risk having to Rogers back if his foley clots off.  Objective: BP (!) 161/98 (BP Location: Left Arm)   Pulse 98   Temp 98.6 F (37 C)   Resp 15   Ht 5' 6 (1.676 m)   Wt 88.9 kg   SpO2 94%   BMI 31.63 kg/m   Pleasant, elderly male in no distress Clear, nonlabored anterolaterally RRR, HR down from yesterday, trace edema Soft, NT, ND Stable hematuria without significant clots in foley.   Assessment & Plan: Widely metastatic cancer involving bones, pancreas, prostate,  lung. - Suspected poor prognosis was discussed with patient, and sister who is a retired engineer, civil (consulting). Plan is to pursue comfort measures, planning/arranging discharge home with hospice care.  - Continue prns for comfort care (albeit not requiring any at this time) and avoiding further diagnostics and interventions (e.g. spine MRI)   ABLA due to hematuria on chronic anemia due to malignancy/chronic disease:  - Hgb still low with ongoing bleeding, platelets also not up as robustly as would be hoped, suspect malignancy related myelosuppression, will repeat transfusions of platelets and RBCs since we've confirmed this is within GOC. If still having hematuria despite holding DOAC and adequate platelet count (has not been achieved yet), would enlist urology for opinion Re: ?cysto/fulguration.   Acute hypoxemia, pneumonia: Possibly related to pulmonary nodule, suspected interstitial spread of malignancy, and complicated by atelectasis. Cannot r/o pneumonia.  - Continue typical abx for now. Day 5 of 5 today - Continue supplemental oxygen, added nasal saline  AKI on CKD stage III: Prerenal azotemia was suspected, also noted to have urinary retention for which foley catheter was placed 11/6. - Avoid nephrotoxins, will recheck per evolving GOC discussions   History of DVT/PE now with hematuria s/p foley insertion and self-limited epistaxis. - Given ongoing hematuria, plan of care discussed with patient and family who all agree to stop DOAC despite heightened risk of VTE with malignancy    Hypertension - Continuing metoprolol , holding others for now    T2DM: - SSI. Holding home Tx, will discontinue metformin  at DC.   Stephen KATHEE Come, MD [facility] [email or web address] 09/30/2024, 8:22 AM

## 2024-09-30 NOTE — Plan of Care (Signed)
   Problem: Coping: Goal: Level of anxiety will decrease Outcome: Progressing

## 2024-09-30 NOTE — Plan of Care (Signed)
   Problem: Education: Goal: Knowledge of General Education information will improve Description: Including pain rating scale, medication(s)/side effects and non-pharmacologic comfort measures Outcome: Progressing   Problem: Nutrition: Goal: Adequate nutrition will be maintained Outcome: Progressing   Problem: Safety: Goal: Ability to remain free from injury will improve Outcome: Progressing

## 2024-10-01 DIAGNOSIS — N179 Acute kidney failure, unspecified: Secondary | ICD-10-CM | POA: Diagnosis not present

## 2024-10-01 DIAGNOSIS — D62 Acute posthemorrhagic anemia: Secondary | ICD-10-CM | POA: Diagnosis not present

## 2024-10-01 LAB — BPAM RBC
Blood Product Expiration Date: 202511242359
ISSUE DATE / TIME: 202511091047
Unit Type and Rh: 1700

## 2024-10-01 LAB — PREPARE PLATELET PHERESIS
Unit division: 0
Unit division: 0

## 2024-10-01 LAB — BPAM PLATELET PHERESIS
Blood Product Expiration Date: 202511102359
Blood Product Expiration Date: 202511112359
ISSUE DATE / TIME: 202511091346
ISSUE DATE / TIME: 202511091630
Unit Type and Rh: 6200
Unit Type and Rh: 6200

## 2024-10-01 LAB — CBC
HCT: 17.1 % — ABNORMAL LOW (ref 39.0–52.0)
HCT: 27.2 % — ABNORMAL LOW (ref 39.0–52.0)
Hemoglobin: 5.4 g/dL — CL (ref 13.0–17.0)
Hemoglobin: 9.1 g/dL — ABNORMAL LOW (ref 13.0–17.0)
MCH: 30.7 pg (ref 26.0–34.0)
MCH: 29.4 pg (ref 26.0–34.0)
MCHC: 31.6 g/dL (ref 30.0–36.0)
MCHC: 33.5 g/dL (ref 30.0–36.0)
MCV: 97.2 fL (ref 80.0–100.0)
MCV: 88 fL (ref 80.0–100.0)
Platelets: 19 K/uL — CL (ref 150–400)
Platelets: 43 K/uL — ABNORMAL LOW (ref 150–400)
RBC: 1.76 MIL/uL — ABNORMAL LOW (ref 4.22–5.81)
RBC: 3.09 MIL/uL — ABNORMAL LOW (ref 4.22–5.81)
RDW: 15.8 % — ABNORMAL HIGH (ref 11.5–15.5)
RDW: 17.6 % — ABNORMAL HIGH (ref 11.5–15.5)
WBC: 5.6 K/uL (ref 4.0–10.5)
WBC: 6.8 K/uL (ref 4.0–10.5)
nRBC: 11.6 % — ABNORMAL HIGH (ref 0.0–0.2)
nRBC: 20 % — ABNORMAL HIGH (ref 0.0–0.2)

## 2024-10-01 LAB — COMPREHENSIVE METABOLIC PANEL WITH GFR
ALT: 28 U/L (ref 0–44)
AST: 88 U/L — ABNORMAL HIGH (ref 15–41)
Albumin: 3.7 g/dL (ref 3.5–5.0)
Alkaline Phosphatase: 332 U/L — ABNORMAL HIGH (ref 38–126)
Anion gap: 13 (ref 5–15)
BUN: 20 mg/dL (ref 8–23)
CO2: 24 mmol/L (ref 22–32)
Calcium: 8.9 mg/dL (ref 8.9–10.3)
Chloride: 104 mmol/L (ref 98–111)
Creatinine, Ser: 1 mg/dL (ref 0.61–1.24)
GFR, Estimated: >60 mL/min (ref >60)
Glucose, Bld: 304 mg/dL — ABNORMAL HIGH (ref 70–99)
Potassium: 4.1 mmol/L (ref 3.5–5.1)
Sodium: 141 mmol/L (ref 135–145)
Total Bilirubin: 1.1 mg/dL (ref 0.0–1.2)
Total Protein: 5.5 g/dL — ABNORMAL LOW (ref 6.5–8.1)

## 2024-10-01 LAB — GLUCOSE, CAPILLARY
Glucose-Capillary: 325 mg/dL — ABNORMAL HIGH (ref 70–99)
Glucose-Capillary: 328 mg/dL — ABNORMAL HIGH (ref 70–99)
Glucose-Capillary: 296 mg/dL — ABNORMAL HIGH (ref 70–99)
Glucose-Capillary: 279 mg/dL — ABNORMAL HIGH (ref 70–99)

## 2024-10-01 LAB — LACTIC ACID, PLASMA: Lactic Acid, Venous: 1.6 mmol/L (ref 0.5–1.9)

## 2024-10-01 LAB — TYPE AND SCREEN
ABO/RH(D): B POS
Antibody Screen: NEGATIVE
Unit division: 0

## 2024-10-01 LAB — PRO BRAIN NATRIURETIC PEPTIDE: Pro Brain Natriuretic Peptide: 4440 pg/mL — ABNORMAL HIGH (ref <300.0)

## 2024-10-01 MED ORDER — INSULIN ASPART 100 UNIT/ML IJ SOLN
0.0000 [IU] | Freq: Every day | INTRAMUSCULAR | Status: DC
  Administered 2024-10-01: 3 [IU] via SUBCUTANEOUS
  Filled 2024-10-01: qty 1

## 2024-10-01 MED ORDER — INSULIN ASPART 100 UNIT/ML IJ SOLN: 0.0000 [IU] | INTRAMUSCULAR | Status: DC

## 2024-10-01 MED ORDER — FUROSEMIDE 10 MG/ML IJ SOLN
60.0000 mg | Freq: Once | INTRAMUSCULAR | Status: AC
  Administered 2024-10-01: 60 mg via INTRAVENOUS
  Filled 2024-10-01: qty 6

## 2024-10-01 MED ORDER — INSULIN ASPART 100 UNIT/ML IJ SOLN
0.0000 [IU] | Freq: Three times a day (TID) | INTRAMUSCULAR | Status: DC
  Administered 2024-10-01: 7 [IU] via SUBCUTANEOUS
  Administered 2024-10-01: 5 [IU] via SUBCUTANEOUS
  Administered 2024-10-02 (×2): 7 [IU] via SUBCUTANEOUS
  Filled 2024-10-01 (×4): qty 1

## 2024-10-01 MED ORDER — SODIUM CHLORIDE 0.9% IV SOLUTION: Freq: Once | INTRAVENOUS | Status: AC

## 2024-10-01 NOTE — Progress Notes (Signed)
 [facility] Liaison Note   [facility] continues to follow for discharge disposition for hospice services at home.     Please call with any hospice related questions or concerns.   Greig Basket, BSN, RN [facility] Liaison [phone]

## 2024-10-01 NOTE — Plan of Care (Signed)
  Problem: Elimination: Goal: Will not experience complications related to bowel motility Outcome: Progressing   Problem: Pain Managment: Goal: General experience of comfort will improve and/or be controlled Outcome: Progressing   Problem: Education: Goal: Knowledge of General Education information will improve Description: Including pain rating scale, medication(s)/side effects and non-pharmacologic comfort measures Outcome: Not Progressing   Problem: Activity: Goal: Risk for activity intolerance will decrease Outcome: Not Progressing   Problem: Nutrition: Goal: Adequate nutrition will be maintained Outcome: Not Progressing

## 2024-10-01 NOTE — Progress Notes (Signed)
 [facility] PROGRESS NOTE  Stephen METAYER Sr. (DOB: [birth year]) FMW:[id number] PCP: Carlette Benita Area, MD  Brief Narrative: Stephen FORBES Ochs Sr. is an 83 y.o. male with a history of T2DM, HTN, HLD, stage IIIb CKD, hx DVT/PE on eliquis  who presented to the ED on 09/26/2024 with generalized weakness, worsening immobility, incontinence, poor appetite, suspicious for failure to thrive. Work up revealed AKI, anemia with negative FOBT, and hyperglycemia. CT chest/abdomen/pelvis was obtained, which was significant for widely metastatic malignancy with widespread lytic lesions in spine, pelvis and retroperitoneal and pelvic lymph nodes, with pancreatic mass, large heterogeneous prostate, pleural effusions, and multiple pulmonary nodules. He was given IV fluids. Goals of care discussions initiated, plan is to not pursue further work up or intervention for suspected widespread malignancy. Palliative care is consulted as well.   Foley catheter inserted due to urinary retention, initial labs also notable for anemia and clumped platelets. Eliquis  was stopped, but the patient has continued to have some hematuria. GOC discussions revisited, pt/family opted to pursue transfusion if that became necessary, so CBC was rechecked confirming severe thrombocytopenia and progressive anemia. RBCs and platelet transfusions ordered on 11/8, 11/9. Hgb up, bleeding is slowing but continues, platelets still < 50k. FFP and platelet transfusions reordered 11/10.   Subjective: Very difficult to arouse this morning but without complaints. His sister states this is completely normal for him, often has to wake him up with a wet washcloth to the face. Blood in foley is decreasing, some yellow urine as well.   Objective: BP (!) 161/90 (BP Location: Left Arm)   Pulse (!) 107   Temp (!) 97.5 F (36.4 C) (Oral)   Resp 20   Ht 5' 6 (1.676 m)   Wt 88.9 kg   SpO2 94%   BMI 31.63 kg/m   Gen: Elderly male in no distress  resting soundly Pulm: Clear, diminished, not labored  CV: Regular tachycardia, no MRG, 1+ LE edema GI: Soft, NT, ND, +BS Neuro: Rousable and tracks but falls back asleep. No new focal deficits. Ext: Warm, no deformities Skin: No rashes, lesions or ulcers on visualized skin   Assessment & Plan: Widely metastatic cancer involving bones, pancreas, prostate, lung. - Poor prognosis was discussed with patient, and sister who is a retired engineer, civil (consulting). Plan is to pursue comfort measures, planning/arranging discharge home with hospice care.  - Continue prns for comfort care (albeit not requiring any at this time) and avoiding further diagnostics and interventions (e.g. spine MRI)   ABLA due to hematuria on chronic anemia due to malignancy/chronic disease:  - Hgb rebounded appropriately to transfusions, though bleeding is slowing it is ongoing, platelets still < 50k. Will repeat 2 packs of platelets. Given eliquis  last in PM of 11/6, so should be out of system essentially at this time, will add FFP today and monitor hematuria. Only criteria for discharge home with hospice is cessation of bleeding, hopeful for 11/11 AM.   Acute hypoxemia, pneumonia: Possibly related to pulmonary nodule, suspected interstitial spread of malignancy, and complicated by atelectasis. Cannot r/o pneumonia.  - Completed abx. - Continue supplemental oxygen for comfort, added nasal saline  AKI on CKD stage IIIa: Prerenal azotemia was suspected, also noted to have urinary retention for which foley catheter was placed 11/6. - Avoid nephrotoxins, will recheck per evolving GOC discussions - Cr down to 1, though developing volume overload by exam in setting of blood products, will give IV lasix  today and monitor. BNP confirmed to be rising.  History of DVT/PE now with hematuria s/p foley insertion and self-limited epistaxis. - Given ongoing hematuria, plan of care discussed with patient and family who all agree to stop DOAC despite  heightened risk of VTE with malignancy    Hypertension - Continuing metoprolol , holding others for now    T2DM: - Hyperglycemia noted, will institute SSI. Holding home Tx, will discontinue metformin  at DC.  Drowsiness: This is apparently a normal occurrence for the patient early in the morning. He is improving, lactic acid wnl, nonfocal.    Stephen KATHEE Come, MD [facility] [email or web address] 10/01/2024, 9:52 AM

## 2024-10-02 DIAGNOSIS — C799 Secondary malignant neoplasm of unspecified site: Secondary | ICD-10-CM

## 2024-10-02 DIAGNOSIS — N179 Acute kidney failure, unspecified: Secondary | ICD-10-CM | POA: Diagnosis not present

## 2024-10-02 DIAGNOSIS — D696 Thrombocytopenia, unspecified: Secondary | ICD-10-CM

## 2024-10-02 LAB — CBC
HCT: 24.4 % — ABNORMAL LOW (ref 39.0–52.0)
Hemoglobin: 8.1 g/dL — ABNORMAL LOW (ref 13.0–17.0)
MCH: 29.5 pg (ref 26.0–34.0)
MCHC: 33.2 g/dL (ref 30.0–36.0)
MCV: 88.7 fL (ref 80.0–100.0)
Platelets: 54 K/uL — ABNORMAL LOW (ref 150–400)
RBC: 2.75 MIL/uL — ABNORMAL LOW (ref 4.22–5.81)
RDW: 18.1 % — ABNORMAL HIGH (ref 11.5–15.5)
WBC: 6.8 K/uL (ref 4.0–10.5)
nRBC: 21.8 % — ABNORMAL HIGH (ref 0.0–0.2)

## 2024-10-02 LAB — GLUCOSE, CAPILLARY
Glucose-Capillary: 312 mg/dL — ABNORMAL HIGH (ref 70–99)
Glucose-Capillary: 321 mg/dL — ABNORMAL HIGH (ref 70–99)

## 2024-10-02 LAB — BPAM PLATELET PHERESIS
Blood Product Expiration Date: 202511101504
Blood Product Expiration Date: 202511122359
ISSUE DATE / TIME: 202511101108
ISSUE DATE / TIME: 202511101547
Unit Type and Rh: 5100
Unit Type and Rh: 7300

## 2024-10-02 LAB — PREPARE PLATELET PHERESIS
Unit division: 0
Unit division: 0

## 2024-10-02 LAB — PREPARE FRESH FROZEN PLASMA

## 2024-10-02 LAB — BPAM FFP
Blood Product Expiration Date: 202511152359
ISSUE DATE / TIME: 202511102228
Unit Type and Rh: 1700

## 2024-10-02 MED ORDER — LORAZEPAM 2 MG/ML PO CONC: 1.0000 mg | ORAL | 0 refills | Status: AC | PRN

## 2024-10-02 MED ORDER — HALOPERIDOL LACTATE 2 MG/ML PO CONC: 0.5000 mg | Freq: Four times a day (QID) | ORAL | 0 refills | Status: AC | PRN

## 2024-10-02 MED ORDER — OXYCODONE HCL 20 MG/ML PO CONC: 5.0000 mg | ORAL | 0 refills | Status: AC | PRN

## 2024-10-02 NOTE — Care Management Important Message (Signed)
 Important Message  Patient Details  Name: Stephen JOHN Sr. MRN: [id number] Date of Birth: 12-Jul-1941   Important Message Given:  Yes - Medicare IM     Lathen Seal L Labrenda Lasky 10/02/2024, 1:22 PM

## 2024-10-02 NOTE — Discharge Summary (Signed)
 Physician Discharge Summary   Patient: Stephen CRISSMAN Sr. MRN: [id number] DOB: 29-Jun-1941  Admit date:     09/26/2024  Discharge date: 10/02/24  Discharge Physician: [name]   PCP: Carlette Benita Area, MD   Recommendations at discharge:   Comfort measures   Discharge Diagnoses: Principal Problem:   AKI (acute kidney injury) Active Problems:   Type 2 diabetes mellitus, controlled, with renal complications (HCC)   Mixed hyperlipidemia   Left leg DVT (HCC)   CKD stage 3 due to type 2 diabetes mellitus (HCC)   Dyslipidemia associated with type 2 diabetes mellitus (HCC)   Acute pulmonary embolism (HCC)   Essential hypertension   Obesity (BMI 30-39.9)   Physical deconditioning  Hospital Course: Stephen BALSAM Sr. is an 83 y.o. male with a history of T2DM, HTN, HLD, stage IIIb CKD, hx DVT/PE on eliquis  who presented to the ED on 09/26/2024 with generalized weakness, worsening immobility, incontinence, poor appetite, suspicious for failure to thrive. Work up revealed AKI, anemia with negative FOBT, and hyperglycemia. CT chest/abdomen/pelvis was obtained, which was significant for widely metastatic malignancy with widespread lytic lesions in spine, pelvis and retroperitoneal and pelvic lymph nodes, with pancreatic mass, large heterogeneous prostate, pleural effusions, and multiple pulmonary nodules. He was given IV fluids. Goals of care discussions initiated, plan is to not pursue further work up or intervention for suspected widespread malignancy. Palliative care is consulted as well.    Foley catheter inserted due to urinary retention, initial labs also notable for anemia and clumped platelets. Eliquis  was stopped, but the patient has continued to have some hematuria. GOC discussions revisited, pt/family opted to pursue transfusion if that became necessary, so CBC was rechecked confirming severe thrombocytopenia and progressive anemia. RBCs and platelet transfusions ordered on 11/8,  11/9. Hgb up, bleeding is slowing but continues, platelets still < 50k. FFP and platelet transfusions reordered 11/10. On 11/11, the patient continues to have low level hematuria though hgb and platelets are above thresholds for additional transfusion. GOC discussions have been continued with medical and palliative care teams as well as hospice and family. We will discharge the patient with comfort measures only to home with hospice care.   Assessment and Plan: - Oxycodone Concentrate 20mg /ml: 5mg  (0.75ml) sublingual every 1 hour as needed for pain or shortness of breath: Disp 30ml - Lorazepam  2mg /ml concentrated solution: 1mg  (0.81ml) sublingual every 4 hours as needed for anxiety: Disp 30ml - Haldol 2mg /ml solution: 0.5mg  (0.25ml) sublingual every 4 hours as needed for agitation or nausea: Disp 30ml   Consultants: Palliative Procedures performed: None  Disposition: Home w/hospice Diet recommendation: As desired DISCHARGE MEDICATION: Allergies as of 10/02/2024   No Known Allergies      Medication List     STOP taking these medications    atorvastatin  40 MG tablet Commonly known as: LIPITOR   B-D SINGLE USE SWABS REGULAR Pads   Eliquis  5 MG Tabs tablet Generic drug: apixaban    hydrALAZINE  100 MG tablet Commonly known as: APRESOLINE    isosorbide  mononitrate 30 MG 24 hr tablet Commonly known as: IMDUR    losartan -hydrochlorothiazide 100-25 MG tablet Commonly known as: HYZAAR   metFORMIN  500 MG tablet Commonly known as: GLUCOPHAGE    NON FORMULARY   Tradjenta  5 MG Tabs tablet Generic drug: linagliptin    True Metrix Blood Glucose Test test strip Generic drug: glucose blood       TAKE these medications    furosemide  20 MG tablet Commonly known as: LASIX  Take 20 mg by  mouth daily as needed for edema.   haloperidol 2 MG/ML solution Commonly known as: HALDOL Take 0.3 mLs (0.6 mg total) by mouth every 6 (six) hours as needed for agitation.   LORazepam  2 MG/ML  concentrated solution Commonly known as: ATIVAN  Take 0.5 mLs (1 mg total) by mouth every 4 (four) hours as needed for anxiety, seizure, sedation or sleep.   metoprolol  succinate 50 MG 24 hr tablet Commonly known as: TOPROL -XL Take 1 tablet (50 mg total) by mouth daily. Take with or immediately following a meal.   oxyCODONE 20 MG/ML concentrated solution Commonly known as: ROXICODONE INTENSOL Take 0.3 mLs (6 mg total) by mouth every hour as needed (pain or dyspnea).   verapamil  240 MG CR tablet Commonly known as: CALAN -SR Take 240 mg by mouth daily.        Discharge Exam: Filed Weights   09/26/24 1127 09/26/24 2039  Weight: 87.5 kg 88.9 kg  No distress  Condition at discharge: Stable for transport, guarded prognosis  The results of significant diagnostics from this hospitalization (including imaging, microbiology, ancillary and laboratory) are listed below for reference.   Imaging Studies: CT Chest Wo Contrast Result Date: 09/26/2024 EXAM: CT CHEST WITHOUT CONTRAST 09/26/2024 06:19:55 PM TECHNIQUE: CT of the chest was performed without the administration of intravenous contrast. Multiplanar reformatted images are provided for review. Automated exposure control, iterative reconstruction, and/or weight based adjustment of the mA/kV was utilized to reduce the radiation dose to as low as reasonably achievable. COMPARISON: Chest CT 06/19/2001. CLINICAL HISTORY: abnormal abdominal ct, weakness, metastatic disease. FINDINGS: MEDIASTINUM: Heart: Upper normal cardiac size. Multivessel coronary vascular calcification. No pericardial effusion. Vessels: Aortic atherosclerosis. Ectatic ascending aorta up to 3.8 cm. Airways: The central airways are clear. Other: Subcentimeter thyroid nodules, no imaging follow-up is recommended. Esophagus within normal limits. LYMPH NODES: Mildly enlarged AP window node measuring up to 13 mm. Borderline low right paratracheal node measuring 9 mm. Subcarinal nodes  measuring up to 13 mm. Limited assessment of hilar nodes without contrast. No axillary lymphadenopathy. LUNGS AND PLEURA: Small to moderate bilateral pleural effusions. Partial lower lobe consolidations, either due to atelectasis or pneumonia. Diffuse septal thickening within the bilateral lungs. Peribronchovascular nodularity as well as nodularity along the pulmonary fissures. More discrete focal pulmonary nodule at the right middle lobe measuring 12 mm on series 4 image 64. Other small pulmonary nodules are noted. No pneumothorax. SOFT TISSUES/BONES: Numerous lytic lesions involving the sternum, spine, and scapula consistent with osseous metastatic disease. Possible epidural mass at the T5-T6 level. UPPER ABDOMEN: See separately dictated abdominal pelvic CT IMPRESSION: 1. Small to moderate bilateral pleural effusions. 2. Partial lower lobe consolidations, which may represent atelectasis or pneumonia. 3. Right middle lobe solid pulmonary nodule measuring 12 mm, with additional smaller pulmonary nodules raising concern for metastatic disease. Diffuse bilateral septal thickening and ground-glass density, some of which may be due to edema, however peribronchovascular nodularity and nodularity along the pulmonary fissures raising concern for neoplastic etiology / interstitial spread of disease. 4. Mild mediastinal enlargement  lymphadenopathy. 5. Numerous lytic lesions in the sternum, spine, and scapula consistent with osseous metastatic disease. 6. Possible epidural mass at T5T6; recommend dedicated spine MRI with contrast for further evaluation. Electronically signed by: Luke Bun MD 09/26/2024 06:48 PM EST RP Workstation: [id number]   CT ABDOMEN PELVIS WO CONTRAST Result Date: 09/26/2024 EXAM: CT ABDOMEN AND PELVIS WITHOUT CONTRAST 09/26/2024 04:49:01 PM TECHNIQUE: CT of the abdomen and pelvis was performed without the administration of intravenous contrast. Multiplanar  reformatted images are provided for  review. Automated exposure control, iterative reconstruction, and/or weight-based adjustment of the mA/kV was utilized to reduce the radiation dose to as low as reasonably achievable. COMPARISON: Chest x-ray 09/26/2021, MRI 05/18/2019, CT 05/18/2019 CLINICAL HISTORY: Abdominal/flank pain, stone suspected. FINDINGS: LOWER CHEST: Lung bases demonstrate coronary vascular calcifications. Small bilateral pleural effusions. Partial atelectasis or pneumonia in the lower lobes. Diffuse septal thickening, probably due to edema. Possible right middle lobe pulmonary nodule measuring 11 mm on series 8 image 2, incompletely assessed. There may be peribronchial vascular nodularity in the right middle lobe, series 8 image 2, also incompletely assessed. LIVER: The liver is unremarkable. GALLBLADDER AND BILE DUCTS: Cholecystectomy. Prominence of the common bile duct, probably due to postsurgical change. SPLEEN: No acute abnormality. PANCREAS: 19 mm soft tissue nodule anterior to the proximal pancreas on series 2 image 27, further enlarged lymph node over pancreas mass. ADRENAL GLANDS: No acute abnormality. KIDNEYS, URETERS AND BLADDER: Multiple low-density renal lesions, probably cysts, but no specific imaging follow up recommended. No stones in the kidneys or ureters. No hydronephrosis. No perinephric or periureteral stranding. The bladder is unremarkable. GI AND BOWEL: Stomach demonstrates no acute abnormality. Small fat density focus at the duodenal bulb, series 2 image 26 suggesting small lipoma. There is no bowel obstruction. PERITONEUM AND RETROPERITONEUM: No ascites. No free air. Mild presacral soft tissue stranding. VASCULATURE: Aorta is normal in caliber. Moderate aortic atherosclerosis. LYMPH NODES: Multiple enlarged retroperitoneal and pelvic lymph nodes. Left periaortic nodes up to 2.8 cm on series 2 image 33. Bilateral iliac chain nodes measuring up to 2.9 cm on the left and 2.9 cm on the right. Right pelvic node  measures 2.7 cm on series 2 image 61. Left pelvic lymph node measures 2.4 cm on series 2 image 61. Right external iliac node measuring 2.9 cm on series 2 image 66. REPRODUCTIVE ORGANS: Enlarged irregular heterogeneous prostate, increased compared to prior. BONES AND SOFT TISSUES: Interval widespread lytic lesions involving the spine and pelvis, consistent with metastatic disease. Advanced multilevel degenerative changes. Suspected area of canal stenosis T10-T11. Moderate-to-severe canal stenosis suspected at L2-L3, L3-L4, and L4-L5. IMPRESSION: 1. Widespread lytic lesions in the spine and pelvis consistent with metastatic disease. Negative for hydronephrosis or urinary tract stone 2. Multiple enlarged retroperitoneal and pelvic lymph node suspicious for metastatic disease or lymphoproliferative disease. 3. A 19 mm soft tissue nodule anterior to the proximal pancreas, suspicious for nodal metastasis less likely pancreatic mass; recommend dedicated pancreas protocol imaging as clinically appropriate. 4. Enlarged irregular heterogeneous prostate, increased compared to prior, recommend correlation with PSA. 5. Small bilateral pleural effusions with lower lobe atelectasis versus pneumonia; incompletely visualized possible right middle lobe pulmonary nodule and peribronchovascular nodularity at the right middle lobe, recommend dedicated chest CT for further assessment. 6. multilevel degenerative changes of the lower thoracic and lumbar spine with suspected areas of multilevel canal stenosis. MRI follow-up as appropriate Electronically signed by: Luke Bun MD 09/26/2024 05:15 PM EST RP Workstation: [id number]   DG Chest 2 View Result Date: 09/26/2024 EXAM: 2 VIEW(S) XRAY OF THE CHEST 09/26/2024 11:43:00 AM COMPARISON: Prior studies are available. CLINICAL HISTORY: Weakness. FINDINGS: LUNGS AND PLEURA: No focal pulmonary opacity. Mild-to-moderate bilateral pulmonary edema is noted. Probable small pleural effusions. No  pneumothorax. HEART AND MEDIASTINUM: No acute abnormality of the cardiac and mediastinal silhouettes. BONES AND SOFT TISSUES: No acute osseous abnormality. IMPRESSION: 1. Mild-to-moderate bilateral pulmonary edema. 2. Probable small bilateral pleural effusions. Electronically signed by: Lynwood Seip MD 09/26/2024 12:15  PM EST RP Workstation: [id number]    Microbiology: Results for orders placed or performed during the hospital encounter of 09/26/24  Resp panel by RT-PCR (RSV, Flu A&B, Covid) Anterior Nasal Swab     Status: None   Collection Time: 09/26/24  3:25 PM   Specimen: Anterior Nasal Swab  Result Value Ref Range Status   SARS Coronavirus 2 by RT PCR NEGATIVE NEGATIVE Final    Comment: (NOTE) SARS-CoV-2 target nucleic acids are NOT DETECTED.  The SARS-CoV-2 RNA is generally detectable in upper respiratory specimens during the acute phase of infection. The lowest concentration of SARS-CoV-2 viral copies this assay can detect is 138 copies/mL. A negative result does not preclude SARS-Cov-2 infection and should not be used as the sole basis for treatment or other patient management decisions. A negative result may occur with  improper specimen collection/handling, submission of specimen other than nasopharyngeal swab, presence of viral mutation(s) within the areas targeted by this assay, and inadequate number of viral copies(<138 copies/mL). A negative result must be combined with clinical observations, patient history, and epidemiological information. The expected result is Negative.  Fact Sheet for Patients:  bloggercourse.com  Fact Sheet for Healthcare Providers:  seriousbroker.it  This test is no t yet approved or cleared by the [place]  FDA and  has been authorized for detection and/or diagnosis of SARS-CoV-2 by FDA under an Emergency Use Authorization (EUA). This EUA will remain  in effect (meaning this test can be used)  for the duration of the COVID-19 declaration under Section 564(b)(1) of the Act, 21 U.S.C.section 360bbb-3(b)(1), unless the authorization is terminated  or revoked sooner.       Influenza A by PCR NEGATIVE NEGATIVE Final   Influenza B by PCR NEGATIVE NEGATIVE Final    Comment: (NOTE) The Xpert Xpress SARS-CoV-2/FLU/RSV plus assay is intended as an aid in the diagnosis of influenza from Nasopharyngeal swab specimens and should not be used as a sole basis for treatment. Nasal washings and aspirates are unacceptable for Xpert Xpress SARS-CoV-2/FLU/RSV testing.  Fact Sheet for Patients: bloggercourse.com  Fact Sheet for Healthcare Providers: seriousbroker.it  This test is not yet approved or cleared by the [place]  FDA and has been authorized for detection and/or diagnosis of SARS-CoV-2 by FDA under an Emergency Use Authorization (EUA). This EUA will remain in effect (meaning this test can be used) for the duration of the COVID-19 declaration under Section 564(b)(1) of the Act, 21 U.S.C. section 360bbb-3(b)(1), unless the authorization is terminated or revoked.     Resp Syncytial Virus by PCR NEGATIVE NEGATIVE Final    Comment: (NOTE) Fact Sheet for Patients: bloggercourse.com  Fact Sheet for Healthcare Providers: seriousbroker.it  This test is not yet approved or cleared by the [place]  FDA and has been authorized for detection and/or diagnosis of SARS-CoV-2 by FDA under an Emergency Use Authorization (EUA). This EUA will remain in effect (meaning this test can be used) for the duration of the COVID-19 declaration under Section 564(b)(1) of the Act, 21 U.S.C. section 360bbb-3(b)(1), unless the authorization is terminated or revoked.  Performed at [facility], [address], [place], [place] [address]     Labs: CBC: Recent Labs  Lab 09/26/24 1228  09/29/24 1012 09/29/24 2308 09/30/24 0434 10/01/24 0846 10/02/24 0424  WBC 5.5 5.6 6.1 6.3 6.8 6.8  NEUTROABS 2.9  --   --   --   --   --   HGB 7.2* 5.4* 7.9* 7.9* 9.1* 8.1*  HCT 23.1* 17.1* 24.5*  24.3* 27.2* 24.4*  MCV 98.7 97.2 88.4 88.4 88.0 88.7  PLT PLATELET CLUMPS NOTED ON SMEAR, COUNT APPEARS DECREASED 19* 41* 32* 43* 54*   Basic Metabolic Panel: Recent Labs  Lab 09/26/24 1228 09/29/24 2308 10/01/24 0846  NA 140 140 141  K 4.9 4.2 4.1  CL 101 105 104  CO2 26 24 24   GLUCOSE 224* 316* 304*  BUN 49* 27* 20  CREATININE 2.13* 1.10 1.00  CALCIUM  9.0 8.8* 8.9   Liver Function Tests: Recent Labs  Lab 09/26/24 1228 10/01/24 0846  AST 74* 88*  ALT 39 28  ALKPHOS 318* 332*  BILITOT 0.7 1.1  PROT 6.2* 5.5*  ALBUMIN 4.1 3.7   CBG: Recent Labs  Lab 10/01/24 1143 10/01/24 1633 10/01/24 2052 10/02/24 0738 10/02/24 1120  GLUCAP 328* 296* 279* 312* 321*    Discharge time spent: greater than 30 minutes.  Signed: [name], MD [facility] 10/02/2024

## 2024-10-02 NOTE — Progress Notes (Signed)
 Daily Progress Note   Patient Name: Stephen LEVENTHAL Sr.       Date: 10/02/2024 DOB: 1941-02-19  Age: 83 y.o. MRN#: [id number] Attending Physician: Bryn Bernardino NOVAK, MD Primary Care Physician: Carlette Benita Area, MD Admit Date: 09/26/2024  Reason for Consultation/Follow-up: Establishing goals of care  Patient Profile/HPI:  83 y.o. male  with past medical history of HLD, AKI, DM admitted on 09/26/2024 with weakness and anemia. Workup reveals widespread skeletal lesions, pancreatic mass, large prostate, lung nodules- likely widespread metastatic cancer. Palliative consulted due to patient wishes to go home with hospice.   Subjective: Chart reviewed including labs, progress notes, imaging from this and previous encounters.  Patient had some recurrent bleeding over the past few days and has received transfusions.  On evaluation he is sleeping. He did not arouse to my voice or touch.  CBC shows platelets improved some to 54. Hgb 8.1.  Case discussed with Dr. Bryn.  Called and discussed with patient's sister. We discussed that given bone mets he likely has bone marrow involvement and will likely continue to have some bleeding and low hemoglobin.  Patient's sister shared that she understood and noted that her only goal at this point is to get patient home. She wants to ensure that Stephen Rogers's last moments are spent with her and his family in his home.  She is ready for him to come home today.      Physical Exam Vitals and nursing note reviewed.             Vital Signs: BP (!) 142/81 (BP Location: Right Arm)   Pulse (!) 132   Temp 98.8 F (37.1 C) (Axillary)   Resp (!) 21   Ht 5' 6 (1.676 m)   Wt 88.9 kg   SpO2 94%   BMI 31.63 kg/m  SpO2: SpO2: 94 % O2 Device: O2 Device: Nasal  Cannula O2 Flow Rate: O2 Flow Rate (L/min): 2 L/min  Intake/output summary:  Intake/Output Summary (Last 24 hours) at 10/02/2024 1106 Last data filed at 10/02/2024 0506 Gross per 24 hour  Intake 1332 ml  Output 900 ml  Net 432 ml   LBM: Last BM Date : 10/01/24 Baseline Weight: Weight: 87.5 kg Most recent weight: Weight: 88.9 kg       Palliative Assessment/Data: PPS: 30%  Patient Active Problem List   Diagnosis Date Noted   Weakness 06/23/2023   Physical deconditioning 06/23/2023   Orthostasis 06/23/2023   Urinary tract infection 06/21/2023   Lactic acidosis 06/21/2023   Hypoalbuminemia due to protein-calorie malnutrition 06/21/2023   Essential hypertension 06/21/2023   Type 2 diabetes mellitus with hyperglycemia (HCC) 06/21/2023   Obesity (BMI 30-39.9) 06/21/2023   Acute pulmonary embolism (HCC) 06/20/2023   Elevated liver enzymes 05/15/2019   Intra-abdominal abscess (HCC) 05/15/2019   CKD stage 3 due to type 2 diabetes mellitus (HCC) 05/07/2019   Dyslipidemia associated with type 2 diabetes mellitus (HCC) 05/07/2019   Hypertension associated with stage 3 chronic kidney disease due to type 2 diabetes mellitus (HCC) 05/07/2019   GERD without esophagitis 05/07/2019   Calculus of gallbladder with cholecystitis without biliary obstruction    Choloenteric fistula    History of cholecystitis    Rectal bleeding 04/18/2019   Anemia 04/18/2019   Acute GI bleeding    Cholecystitis    Fever    At high risk for fluid overload 10/13/2016   Fluid overload 10/13/2016   Bilateral lower extremity edema 10/13/2016   Gait disturbance 10/13/2016   Leukocytosis 10/13/2016   CKD (chronic kidney disease), stage III (HCC) 10/13/2016   Left leg DVT (HCC) 10/13/2016   AKI (acute kidney injury) 10/08/2016   Nausea and vomiting 10/08/2016   Type 2 diabetes mellitus, controlled, with renal complications (HCC) 10/08/2016   Transaminitis 10/08/2016   Mixed hyperlipidemia 10/08/2016    Renal insufficiency    Dehydration 10/07/2016    Palliative Care Assessment & Plan    Assessment/Recommendations/Plan  -Newly found metastatic cancer- no further workup or treatment desired, d/c home with hospice -Pneumonia- recommend transition to oral antibiotic to facilitate discharge -Generalized weakness- continue IV fluids until discharge -On discharge, would recommend scripts for emergency symptom management even though patient not currently having symptoms-  - Oxycodone Concentrate 20mg /ml: 5mg  (0.68ml) sublingual every 1 hour as needed for pain or shortness of breath: Disp 30ml - Lorazepam  2mg /ml concentrated solution: 1mg  (0.39ml) sublingual every 4 hours as needed for anxiety: Disp 30ml - Haldol 2mg /ml solution: 0.5mg  (0.25ml) sublingual every 4 hours as needed for agitation or nausea: Disp 30ml   Plan for discharge home with hospice today   Code Status:   Code Status: Do not attempt resuscitation (DNR) - Comfort care   Prognosis:  < 3 months  Discharge Planning: Home with Hospice  Care plan was discussed with attending Dr. Bryn and patient's family.   Thank you for allowing the Palliative Medicine Team to assist in the care of this patient.  Total time:  Prolonged billing:  Time includes:   Preparing to see the patient (e.g., review of tests) Obtaining and/or reviewing separately obtained history Performing a medically necessary appropriate examination and/or evaluation Counseling and educating the patient/family/caregiver Ordering medications, tests, or procedures Referring and communicating with other health care professionals (when not reported separately) Documenting clinical information in the electronic or other health record Independently interpreting results (not reported separately) and communicating results to the patient/family/caregiver Care coordination (not reported separately) Clinical documentation  Cassondra Stain, AGNP-C Palliative  Medicine   Please contact Palliative Medicine Team phone at [phone] for questions and concerns.

## 2024-10-02 NOTE — TOC Transition Note (Signed)
 Transition of Care [facility]) - Discharge Note   Patient Details  Name: Stephen DRUM Sr. MRN: [id number] Date of Birth: 1941/06/17  Transition of Care [facility]) CM/SW Contact:  Noreen KATHEE Cleotilde ISRAEL Phone Number: 10/02/2024, 9:59 AM   Clinical Narrative:     Patient is DC today home with hospice. Attempted to reached sister regarding DC and to follow up with additional DME. CSW did reach out to Amy with Hospice about the request need for WC and oxygen. Amy assured that they will assist with getting DME arranged. CSW signing off.   Final next level of care: Home w Hospice Care Barriers to Discharge: Barriers Resolved   Patient Goals and CMS Choice Patient states their goals for this hospitalization and ongoing recovery are:: home with hospice CMS Medicare.gov Compare Post Acute Care list provided to:: Other (Comment Required) (Sister) Choice offered to / list presented to : [place] ownership interest in [facility].provided to::  (NA)    Discharge Placement              Patient chooses bed at:  (Home with hospice) Patient to be transferred to facility by: EMS Name of family member notified: Sister Patient and family notified of of transfer: 10/02/24  Discharge Plan and Services Additional resources added to the After Visit Summary for   In-house Referral: Clinical Social Work Discharge Planning Services: NA Post Acute Care Choice: Durable Medical Equipment, Hospice          DME Arranged: Wheelchair manual, Oxygen DME Agency: [facility] Date DME Agency Contacted: 10/02/24 Time DME Agency Contacted: [phone] Representative spoke with at DME Agency: Amy            Social Drivers of Health (SDOH) Interventions SDOH Screenings   Food Insecurity: No Food Insecurity (09/26/2024)  Housing: Unknown (09/26/2024)  Transportation Needs: No Transportation Needs (09/26/2024)  Utilities: Not At Risk (09/26/2024)  Depression (PHQ2-9):  Low Risk  (08/01/2020)  Social Connections: Unknown (09/26/2024)  Tobacco Use: Low Risk  (09/26/2024)     Readmission Risk Interventions    10/02/2024    9:48 AM 09/30/2024    9:54 AM 09/29/2024   10:33 AM  Readmission Risk Prevention Plan  Medication Screening Complete    Transportation Screening Complete Complete Complete  Home Care Screening  Complete Complete  Medication Review (RN CM)  Complete Complete

## 2024-10-02 NOTE — Plan of Care (Signed)
  Problem: Elimination: Goal: Will not experience complications related to urinary retention Outcome: Progressing   Problem: Pain Managment: Goal: General experience of comfort will improve and/or be controlled Outcome: Progressing   Problem: Skin Integrity: Goal: Risk for impaired skin integrity will decrease Outcome: Progressing   Problem: Tissue Perfusion: Goal: Adequacy of tissue perfusion will improve Outcome: Progressing

## 2024-10-02 NOTE — Progress Notes (Signed)
   10/02/24 0745  Assess: MEWS Score  Temp 98.8 F (37.1 C)  BP (!) 142/81  MAP (mmHg) 99  Pulse Rate (!) 132  Resp (!) 21  Level of Consciousness Responds to Voice  SpO2 94 %  O2 Device Nasal Cannula  O2 Flow Rate (L/min) 2 L/min  Assess: MEWS Score  MEWS Temp 0  MEWS Systolic 0  MEWS Pulse 3  MEWS RR 1  MEWS LOC 1  MEWS Score 5  MEWS Score Color Red  Assess: if the MEWS score is Yellow or Red  Were vital signs accurate and taken at a resting state? Yes  Does the patient meet 2 or more of the SIRS criteria? Yes  Does the patient have a confirmed or suspected source of infection? No  MEWS guidelines implemented  No, previously red, continue vital signs every 4 hours  Notify: Charge Nurse/RN  Name of Charge Nurse/RN Notified [facility]  Provider Notification  Provider Name/Title Bryn  Date Provider Notified 10/02/24  Time Provider Notified [phone]  Method of Notification Face-to-face  Notification Reason Change in status  Provider response No new orders  Date of Provider Response 10/02/24  Time of Provider Response 0750  Assess: SIRS CRITERIA  SIRS Temperature  0  SIRS Respirations  1  SIRS Pulse 1  SIRS WBC 0  SIRS Score Sum  2

## 2024-10-02 NOTE — Progress Notes (Signed)
 All forms for discharge have been placed at nurses station. Awaiting [facility] for transport. IV has been removed.

## 2024-10-03 ENCOUNTER — Telehealth: Payer: Self-pay

## 2024-10-03 NOTE — Transitions of Care (Post Inpatient/ED Visit) (Signed)
   10/03/2024  Name: ESTIBEN MIZUNO Sr. MRN: [id number] DOB: 1941/02/19  Today's TOC FU Call Status: Today's TOC FU Call Status:: Successful TOC FU Call Completed TOC FU Call Complete Date: 10/03/24  Patient's Name and Date of Birth confirmed. DOB, Name  Transition Care Management Follow-up Telephone Call Date of Discharge: 10/02/24 [facility] Type of Discharge: Inpatient Admission Primary Inpatient Discharge Diagnosis:: AKI (acute kidney injury)  RN made outreach to patient/family who stated hospice services are not yet in place and states she hasn't heard from them.  Conference call was made with sister to hospice agency at [phone]. Visit was scheduled for today, 10/03/24.  Patient/family aware to contact hospice 24/7 for any questions or concerns. No further interventions at this time.    Shona Prow RN, CCM [place]  VBCI-Population Health RN Care Manager [phone]

## 2024-10-22 DEATH — deceased
